# Patient Record
Sex: Male | Born: 1950
Health system: Southern US, Community
[De-identification: ages and names within clinical notes are randomized; demographics above are authoritative.]

## PROBLEM LIST (undated history)

## (undated) DIAGNOSIS — G8929 Other chronic pain: Secondary | ICD-10-CM

## (undated) DIAGNOSIS — R202 Paresthesia of skin: Secondary | ICD-10-CM

## (undated) DIAGNOSIS — M549 Dorsalgia, unspecified: Secondary | ICD-10-CM

## (undated) DIAGNOSIS — T148XXA Other injury of unspecified body region, initial encounter: Secondary | ICD-10-CM

## (undated) DIAGNOSIS — G4730 Sleep apnea, unspecified: Secondary | ICD-10-CM

## (undated) DIAGNOSIS — R300 Dysuria: Secondary | ICD-10-CM

## (undated) DIAGNOSIS — Z87442 Personal history of urinary calculi: Secondary | ICD-10-CM

## (undated) DIAGNOSIS — M1990 Unspecified osteoarthritis, unspecified site: Secondary | ICD-10-CM

## (undated) DIAGNOSIS — R29898 Other symptoms and signs involving the musculoskeletal system: Secondary | ICD-10-CM

## (undated) DIAGNOSIS — I10 Essential (primary) hypertension: Secondary | ICD-10-CM

## (undated) DIAGNOSIS — R52 Pain, unspecified: Secondary | ICD-10-CM

## (undated) DIAGNOSIS — N289 Disorder of kidney and ureter, unspecified: Secondary | ICD-10-CM

## (undated) HISTORY — DX: Other chronic pain: G89.29

## (undated) HISTORY — DX: Other injury of unspecified body region, initial encounter: T14.8XXA

## (undated) HISTORY — PX: KIDNEY STONE SURGERY: SHX686

## (undated) HISTORY — PX: HERNIA REPAIR: SHX51

## (undated) HISTORY — PX: OTHER SURGICAL HISTORY: SHX169

## (undated) HISTORY — DX: Dysuria: R30.0

## (undated) HISTORY — PX: WISDOM TOOTH EXTRACTION: SHX21

## (undated) HISTORY — PX: MASTECTOMY: SHX3

## (undated) HISTORY — PX: BACK SURGERY: SHX140

## (undated) HISTORY — PX: FACIAL COSMETIC SURGERY: SHX629

## (undated) HISTORY — DX: Essential (primary) hypertension: I10

---

## 1997-02-09 DIAGNOSIS — G8929 Other chronic pain: Secondary | ICD-10-CM

## 1997-02-09 HISTORY — DX: Other chronic pain: G89.29

## 2001-09-05 ENCOUNTER — Ambulatory Visit (HOSPITAL_COMMUNITY): Admission: RE | Admit: 2001-09-05 | Discharge: 2001-09-05 | Payer: Self-pay | Admitting: Obstetrics and Gynecology

## 2001-09-05 ENCOUNTER — Encounter: Payer: Self-pay | Admitting: Orthopedic Surgery

## 2001-11-15 ENCOUNTER — Encounter: Payer: Self-pay | Admitting: Neurosurgery

## 2001-11-15 ENCOUNTER — Ambulatory Visit (HOSPITAL_COMMUNITY): Admission: RE | Admit: 2001-11-15 | Discharge: 2001-11-15 | Payer: Self-pay | Admitting: Neurosurgery

## 2004-09-14 ENCOUNTER — Ambulatory Visit (HOSPITAL_COMMUNITY): Admission: RE | Admit: 2004-09-14 | Discharge: 2004-09-14 | Payer: Self-pay | Admitting: Family Medicine

## 2004-11-11 ENCOUNTER — Inpatient Hospital Stay (HOSPITAL_COMMUNITY): Admission: RE | Admit: 2004-11-11 | Discharge: 2004-11-13 | Payer: Self-pay | Admitting: Neurosurgery

## 2005-08-06 ENCOUNTER — Inpatient Hospital Stay (HOSPITAL_COMMUNITY): Admission: EM | Admit: 2005-08-06 | Discharge: 2005-08-07 | Payer: Self-pay | Admitting: Emergency Medicine

## 2008-12-31 ENCOUNTER — Encounter: Admission: RE | Admit: 2008-12-31 | Discharge: 2008-12-31 | Payer: Self-pay | Admitting: Orthopedic Surgery

## 2009-05-20 ENCOUNTER — Ambulatory Visit (HOSPITAL_COMMUNITY): Admission: RE | Admit: 2009-05-20 | Discharge: 2009-05-20 | Payer: Self-pay | Admitting: Anesthesiology

## 2009-07-11 ENCOUNTER — Encounter: Admission: RE | Admit: 2009-07-11 | Discharge: 2009-07-11 | Payer: Self-pay | Admitting: Orthopedic Surgery

## 2009-08-20 ENCOUNTER — Ambulatory Visit (HOSPITAL_COMMUNITY): Admission: RE | Admit: 2009-08-20 | Discharge: 2009-08-21 | Payer: Self-pay | Admitting: Orthopedic Surgery

## 2010-03-02 ENCOUNTER — Encounter: Payer: Self-pay | Admitting: Orthopedic Surgery

## 2010-04-27 LAB — SURGICAL PCR SCREEN
MRSA, PCR: NEGATIVE
Staphylococcus aureus: NEGATIVE

## 2010-04-27 LAB — DIFFERENTIAL
Basophils Absolute: 0 10*3/uL (ref 0.0–0.1)
Basophils Relative: 1 % (ref 0–1)
Eosinophils Absolute: 0.2 10*3/uL (ref 0.0–0.7)
Eosinophils Relative: 4 % (ref 0–5)
Lymphocytes Relative: 25 % (ref 12–46)
Lymphs Abs: 1.5 10*3/uL (ref 0.7–4.0)
Monocytes Absolute: 0.6 10*3/uL (ref 0.1–1.0)
Monocytes Relative: 10 % (ref 3–12)
Neutro Abs: 3.6 10*3/uL (ref 1.7–7.7)
Neutrophils Relative %: 60 % (ref 43–77)

## 2010-04-27 LAB — CBC
HCT: 41.6 % (ref 39.0–52.0)
Hemoglobin: 14.2 g/dL (ref 13.0–17.0)
MCH: 30.4 pg (ref 26.0–34.0)
MCHC: 34 g/dL (ref 30.0–36.0)
MCV: 89.3 fL (ref 78.0–100.0)
Platelets: 185 10*3/uL (ref 150–400)
RBC: 4.67 MIL/uL (ref 4.22–5.81)
RDW: 12.9 % (ref 11.5–15.5)
WBC: 5.9 10*3/uL (ref 4.0–10.5)

## 2010-04-27 LAB — URINALYSIS, ROUTINE W REFLEX MICROSCOPIC
Glucose, UA: NEGATIVE mg/dL
Hgb urine dipstick: NEGATIVE
Ketones, ur: NEGATIVE mg/dL
Nitrite: NEGATIVE
Protein, ur: NEGATIVE mg/dL
Specific Gravity, Urine: 1.026 (ref 1.005–1.030)
Urobilinogen, UA: 1 mg/dL (ref 0.0–1.0)
pH: 6 (ref 5.0–8.0)

## 2010-04-27 LAB — COMPREHENSIVE METABOLIC PANEL
ALT: 12 U/L (ref 0–53)
AST: 20 U/L (ref 0–37)
Albumin: 4.1 g/dL (ref 3.5–5.2)
Alkaline Phosphatase: 100 U/L (ref 39–117)
BUN: 16 mg/dL (ref 6–23)
CO2: 26 mEq/L (ref 19–32)
Calcium: 9.2 mg/dL (ref 8.4–10.5)
Chloride: 107 mEq/L (ref 96–112)
Creatinine, Ser: 0.95 mg/dL (ref 0.4–1.5)
GFR calc Af Amer: >60 mL/min (ref >60)
GFR calc non Af Amer: >60 mL/min (ref >60)
Glucose, Bld: 99 mg/dL (ref 70–99)
Potassium: 4.1 mEq/L (ref 3.5–5.1)
Sodium: 139 mEq/L (ref 135–145)
Total Bilirubin: 0.5 mg/dL (ref 0.3–1.2)
Total Protein: 7 g/dL (ref 6.0–8.3)

## 2010-04-27 LAB — ABO/RH: ABO/RH(D): A POS

## 2010-04-27 LAB — TYPE AND SCREEN
ABO/RH(D): A POS
Antibody Screen: NEGATIVE

## 2010-04-27 LAB — PROTIME-INR
INR: 1.02 (ref 0.00–1.49)
Prothrombin Time: 13.3 seconds (ref 11.6–15.2)

## 2010-04-27 LAB — APTT: aPTT: 26 seconds (ref 24–37)

## 2010-06-27 NOTE — Op Note (Signed)
NAME:  Luke Foster, Luke Foster NO.:  [id number]   MEDICAL RECORD NO.:  [id number]           PATIENT TYPE:   LOCATION:                                 FACILITY:   PHYSICIAN:  Danae Orleans. Venetia Maxon, M.D.       DATE OF BIRTH:   DATE OF PROCEDURE:  11/11/2004  DATE OF DISCHARGE:                                 OPERATIVE REPORT   PREOPERATIVE DIAGNOSES:  1.  Recurrent herniated lumbar disk with spondylosis.  2.  Degenerative disk disease.  3.  Radiculopathy L4-5 and L5-S1 levels.  4.  Anterololisthesis of L5 on S1.  5.  Retrololisthesis of L4 and L5.   POSTOPERATIVE DIAGNOSES:  1.  Recurrent herniated lumbar disk with spondylosis.  2.  Degenerative disk disease.  3.  Radiculopathy L4-5 and L5-S1 levels.  4.  Anterololisthesis of L5 on S1.  5.  Retrololisthesis of L4 and L5.   PROCEDURE:  1.  Redo laminectomy at L4-5 with transforaminal lumbar interbody fusion at      L4-5  2.  Posterior lumbar interbody fusion at L5-S1 with peak interbody cages      with morselized bone autograft and pedicle screw fixation with      posterolateral arthrodesis of  L4 through sacral levels.   SURGEON:  Danae Orleans. Venetia Maxon, M.D.   ASSISTANT:  Coletta Memos, M.D.   ANESTHESIA:  General endotracheal anesthesia.   ESTIMATED BLOOD LOSS:  Approximately 300 mL.   COMPLICATIONS:  None.   DISPOSITION:  Recovery.   INDICATIONS:  Luke Foster is a 60 year old man who previously had surgery at  L4-5 on the right by another physician.  He had some relief of pain but then  developed persistent severe right greater than left lower extremity pain.  He developed retrololisthesis of L4 and L5 with anterololisthesis of L5 on  S1 with significant foraminal stenosis of the L5-S1 level on the right.  It  was elected to taken him to surgery for redo discectomy with decompression  and fusion of the L4-5 and L5-S1 levels   DESCRIPTION OF PROCEDURE:  [name] was brought to the operating room.  Following a  satisfactory and uncomplicated induction of general endotracheal  anesthesia and placement of intravenous lines, and a Foley catheter; the  patient was placed in the prone position on the operating table  All soft  tissues and bony prominences were padded appropriately.  He was placed on  the [facility] spine frame.  His low back was then shaved, prepped and draped  in the usual sterile fashion.  The area of planned incision was infiltrated  with 1/4% Marcaine and 1/2% lidocaine and 1:200,000 epinephrine.   An incision was made through previous incision to the lumbosacral fascia  from approximately L4 to the sacral levels and carried though subperiosteal  dissection bilaterally exposing the L4 and L5 spinous processes, laminae,  and transverse processes of L4, L5, and the sacral ala bilaterally.  Self-  retaining __________ retractors were placed to facilitate exposure.  After  confirmatory x-ray was obtained which demonstrated marked __________ of the  L4 pedicles a laminectomy was performed on the left side of the midline with  total removal of the left L5 lamina and inferior facet, then the L4 lamina  and inferior facet with subsequent decompression of the thecal sac and L4  and L5 nerve roots.  The discectomy was performed at this level, on the left  of the midline, at the L4-5 and L5-S1 levels.   Attention was then turned to the right side where a similar depression was  performed at L5-S1; and at this level, there was a calcified disk herniation  within the foramen and the L5 nerve root and S1 nerve roots were  decompressed widely. At this level a 12 mm interdisc spacer was then placed  on the left and further aggressive discectomy was performed at the L5-S1  level on the right.  Subsequently the previously operated right L4-5 level  was reexposed and it was found that there was a fracture of the pars  interarticularis with mobile superior articular facet of L4 which was fairly   densely scarred to the L4 nerve root.  This was very carefully decompressed  using loop magnification and microdissection technique but the lateral  thecal sac was decompressed and the L4 nerve root was decompressed.  The  disk space retractor was placed on the right, and then more aggressive  diskectomy was performed at L4-5 on the left. After this was done,  fluoroscopy was brought out onto the field and using intraoperative  fluoroscopy and trial sizer with a 12-mm, interbody spacer; a 12-mm PEEK  cage was selected packing morselized bone autograft inserted into the  interspace it turned into a transverse position.  Additional morselized bone  autograft was placed overlying the spacer.   Attention was then turned to the L5-S1 level where at this level PEEK  interbody spacers 12 mm in thickness were then packed with morselized bone,  autograft and inserted in a cliff orientation.  These were then countersunk  appropriately and an additional bone autograft was placed overlying the  implants  Pedicle screw fixation was then placed using 40 mm x 6.5 mm sacral  screws at the sacrum; 45 x 6.5 mm screws at L5; and 50 x 6.5 mm screws at  L4. All screws had excellent purchase and their positioning was confirmed on  AP and lateral fluoroscopy.  The 50 mm rods were placed overlying these  screws which were locked down in situ; 30 mL of bone graft extender was then  reconstituted with the marrow-rich blood aspirated from the course of the  pedicle screws; and this was then inserted overlying the decorticated  transverse processes of L4, L5, and the sacral ala bilaterally.  Prior to  doing so the wound was copiously irrigated with bacitracin and saline.   The self-retaining retractor was then removed.  Soft tissues were inspected  and found to be in good repair.  The nerve roots were felt to be well  decompressed  The lumbodorsal fascia was closed with 1-0 Vicryl sutures. Subcutaneous tissues were  reapproximated with 2-0 Vicryl interrupted,  inverted sutures, and the skin was reapproximated with interrupted 3-0  Vicryl subcuticular stitch.  The wound was dressed with benzoin, and Steri-  Strips, Telfa gauze, and tape.  The patient was extubated in the operating  room and taken to recovery room in stable, satisfactory condition, having  tolerated his operation well.  Counts correct at the end of the case.      Danae Orleans. Venetia Maxon, M.D.  Electronically Signed    JDS/MEDQ  D:  11/11/2004  T:  11/11/2004  Job:  [id number]

## 2010-06-27 NOTE — H&P (Signed)
NAME:  Luke Foster, Luke Foster NO.:  [id number]   MEDICAL RECORD NO.:  [id number]          PATIENT TYPE:  INP   LOCATION:  1823                         FACILITY:  [facility]   PHYSICIAN:  Corky Crafts, MDDATE OF BIRTH:  July 27, 1950   DATE OF ADMISSION:  08/06/2005  DATE OF DISCHARGE:                                HISTORY & PHYSICAL   PRIMARY CARE PHYSICIAN:  Unassigned.   CHIEF COMPLAINT:  Chest tightness.   HISTORY OF PRESENT ILLNESS:  Luke Foster is a 60 year old Caucasian male  without a known cardiac history.  He has a family history of paroxysmal  atrial fibrillation and hyperlipidemia.  He was in his usual state of health  until early this morning at 2:30 a.m. when he complained of a sudden onset  of chest tightness and diaphoresis.  He stated that the chest tightness felt  like indigestion.  He did not take anything for treatment of the  indigestion and denies a history of GERD.  The chest tightness lasted for  approximately 45 minutes and was spontaneously relieved.  He denies nausea,  vomiting, shortness of breath, dizziness, and syncope.  He denies a history  of hypertension, although he has a home blood pressure cuff.  The patient  checked his blood pressure at home and obtained a diastolic blood pressure  reading of 135.  He showered and went to work and rechecked his blood  pressure upon arrival to work obtaining a reading of 141/101.  He arrived to  the [facility] Emergency Department by a private vehicle.  A 12-lead EKG was  obtained upon arrival to the [facility] Emergency Department which revealed  normal sinus rhythm at 74 beats per minute with nonspecific ST and T-wave  abnormalities.  It revealed no ischemic changes.  Point of care enzymes were  negative x1 with a troponin I of less than 0.05.  The patient admits to  currently being chest pain/chest tightness free and denies a recurrence  since early this morning.   PAST MEDICAL HISTORY:  1.   Insomnia.  2.  Status post back surgery in October of 2006.   ALLERGIES:  CORTICOSTEROIDS, ERYTHROMYCIN.   MEDICATIONS:  Amitriptyline 10 mg at bedtime.   FAMILY HISTORY:  1.  Father living, age 36, paroxysmal atrial fibrillation.  2.  Mother living, age 33, hyperlipidemia.  3.  Two sisters - healthy.  4.  Brother healthy.   SOCIAL HISTORY:  Divorced with no children.  He is employed as a Multimedia programmer.  He denies tobacco, alcohol, or illicit drug use.  He denies a consistent exercise regimen since his back surgery in October of  2006.   REVIEW OF SYSTEMS:  All other systems are negative other than what is stated  in the HPI.   PHYSICAL EXAMINATION:  GENERAL:  A 59 year old well-developed, well-  nourished male, pleasant and cooperative, NAD.  VITALS:  Temperature 98, pulse 81, blood pressure 145/99, respirations 21,  O2 saturations 98% over room air.  HEENT:  Unremarkable.  NECK:  Supple without JVD or carotid bruits bilaterally.  Carotid upstrokes  2+.  LUNGS:  Breath sounds are clear to auscultation bilaterally without wheezes,  rhonchi, crackles, or rales.  HEART:  Regular rate and rhythm.  S1 and S2 normal without murmurs, gallops,  clicks, or rubs.  ABDOMEN:  Soft, nontender, nondistended with active bowel sounds.  No  masses, hepatomegaly, or bilateral bruits.  EXTREMITIES:  No peripheral edema.  DP and PT pulses 2+/2 bilaterally.  SKIN:  Warm and dry without rashes or lesions.  NEUROLOGIC:  Alert and oriented x3.  No focal deficits.  PSYCH:  Normal mood and affect.   LABORATORY DATA:  Sodium 141, potassium 3.7, chloride 110, CO2 22.7, BUN 21,  creatinine 1.1, glucose 95.  Hemoglobin 16, hematocrit 47.  Point of care  enzymes:  Myoglobin 74.6, CK-MB 1.4, troponin I less than 0.05.  EKG:  Normal sinus rhythm at 74 beats per minute with nonspecific ST and T-wave  abnormalities.  No ischemic changes noted.  Chest x-ray:  No acute  cardiomegaly  findings.  Stable chest x-ray.   ASSESSMENT AND PLAN:  1.  Chest tightness, resolved.  Rule out myocardial infarction.  EKG      negative.  Point of care enzymes negative x1.  Obtain serial cardiac      enzymes including CK total, CK-MB, and troponin I now and q.8h. x2.      Admit to cardiac telemetry unit under the service of Dr. Eldridge Dace with      the diagnosis of chest pain.  2.  Start subcutaneous Lovenox 1 mg/kg every 12 hours.  3.  Adenosine Cardiolite in the a.m. on August 07, 2005.  The patient is      unable to walk on a treadmill secondary to his back surgery in October      2006.  The patient will be n.p.o. after midnight on August 06, 2005 except      for medications.  4.  Check a BMET, CBC, FLP, EKG in the a.m.  5.  Start enteric-coated aspirin 325 mg daily.  6.  Start metoprolol 25 mg twice daily.  7.  Sublingual nitroglycerin 0.4 mg x3 as needed for chest pain.  Patient is      currently chest pain/chest tightness free.  8.  Check a PT, INR, and PTT, and magnesium.  9.  Continue amitriptyline 10 mg at bedtime.  10. The patient is to be on bed rest, however, may get out of bed to go to      the bathroom.  11. Diet:  2 g sodium diet.  12. Dr. Eldridge Dace has seen, interviewed, and examined the patient who      participated in the medical decision making and plan of care.      [address] [place], [place]      Corky Crafts, MD  Electronically Signed    RDM/MEDQ  D:  08/06/2005  T:  08/06/2005  Job:  [id number]

## 2010-06-27 NOTE — Op Note (Signed)
NAME:  ZIERE, DOCKEN NO.:  [id number]   MEDICAL RECORD NO.:  [id number]                   PATIENT TYPE:  OIB   LOCATION:  3009                                 FACILITY:  [facility]   PHYSICIAN:  Donzetta Sprung. Wynetta Emery, M.D.                  DATE OF BIRTH:  1950-02-28   DATE OF PROCEDURE:  11/15/2001  DATE OF DISCHARGE:                                 OPERATIVE REPORT   PREOPERATIVE DIAGNOSES:  Right L4 and L5 radiculopathy from preforaminal and  foraminal disk rupture L4-5 right with lateral recess stenosis.   POSTOPERATIVE DIAGNOSES:  Right L4 and L5 radiculopathy from preforaminal  and foraminal disk rupture L4-5 right with lateral recess stenosis.   OPERATION PERFORMED:  Decompressive laminectomy at L4-5 right with  microscopic dissection of the right L4 and L5 nerve roots and foraminotomies  of the right L4 and L5 nerve roots with microscopic diskectomy of the L4-5  disk space.   SURGEON:  Donzetta Sprung. Wynetta Emery, M.D.   ASSISTANT:  Kathaleen Maser. Pool, M.D.   ANESTHESIA:  General endotracheal.   INDICATIONS FOR PROCEDURE:  The patient is a very pleasant 60 year old  gentleman who has had longstanding back and right leg pain that has radiated  down to his anterior thigh and to his ankle and occasionally radiating to  his foot and his big toe. The patient has been refractory to anti-  inflammatories and physical therapy.  The patient is not eligible for  steroid injections secondary to congestive heart failure response with  steroids.  The patient was recommended decompressive laminectomy and  diskectomy.  He has been counseled to the risks of having surgery and he  understands and gives his consent.   DESCRIPTION OF PROCEDURE:  The patient was brought to the OR and was induced  under general anesthesia. The patient was placed supine position and a  preoperative x-ray showed the localizing needle at the L5 spinous process. A  midline incision was made; Bovie  electrocautery was used to dissect down  through subcutaneous tissues and subperiosteal dissection was carried out to  the lamina of L4 and L5 and part of the lamina and facet complex of L3.  Self-retaining retractor was placed and intraoperative x-ray obtained  confirmed level of the L4-5 disk space.  Using high speed drill medial  aspect of the facet complex and the inferior aspect of lamina L4 was drilled  down.  Then using a 3 and 4 mm Kerrison punch, virtually the entire lamina  of L4 was removed from the medial aspect of the facet complex and superior  aspect of the lamina of L5 exposing the ligamentum flavum. This was then  subsequently removed piecemeal fashion exposing thecal sac and under medial  aspect of the facet complex was underbitten in both the L5 and proximal  aspect of the L4 nerve root was identified.  Then operating  microscope was  draped into the field and under  microscopic illumination, the L4 nerve root  was identified and decompressed at its foramen and the L5 nerve root was  also identified.  There was noted to be bulging disk that was compressing  proximally to the L5 nerve root as well as a decompressing the L4 nerve  root.  Annulotomy was made with a lumbar scalpel and pituitary rongeurs were  used to radically clean out the disk space and extend it laterally using a  down going Epstein curet and then a hockey stick.  The lateral disk space  was freed up and several fragments of disk were removed.  At the end of the  diskectomy, the disk space was radically cleaned out and both annulotomies  as we continued to explore the L4 nerve root and L5 nerve root digitally at  the foramen.  It was felt that all the lateral disk underneath the L4 nerve  root had been removed and there was no need to go extraforaminally at this  level.  Then after both neural foramina were noted to be widely patent, the  wound was copiously irrigated and meticulous hemostasis was  maintained.  Gelfoam was laid overtop the dura and the fascia and muscle reapproximated  with 0 interrupted Vicryl, subcutaneous tissues closed with 2-0 interrupted  Vicryls, skin was closed with __________ subcuticularly. Benzoin and Steri-  Strips were applied.  The patient was taken to recovery room in stable  condition.  At end of case needle count and sponge counts were correct.                                               Jillyn Hidden P. Wynetta Emery, M.D.    GPC/MEDQ  D:  11/15/2001  T:  11/15/2001  Job:  [id number]

## 2010-11-04 ENCOUNTER — Other Ambulatory Visit (HOSPITAL_COMMUNITY): Payer: Self-pay | Admitting: Internal Medicine

## 2010-11-04 DIAGNOSIS — N400 Enlarged prostate without lower urinary tract symptoms: Secondary | ICD-10-CM

## 2010-11-06 ENCOUNTER — Other Ambulatory Visit (HOSPITAL_COMMUNITY): Payer: Self-pay

## 2010-11-10 ENCOUNTER — Other Ambulatory Visit (HOSPITAL_COMMUNITY): Payer: Self-pay

## 2011-05-18 ENCOUNTER — Other Ambulatory Visit: Payer: Self-pay | Admitting: Physician Assistant

## 2011-05-18 DIAGNOSIS — M545 Low back pain, unspecified: Secondary | ICD-10-CM

## 2011-05-18 DIAGNOSIS — M79606 Pain in leg, unspecified: Secondary | ICD-10-CM

## 2011-05-22 ENCOUNTER — Ambulatory Visit
Admission: RE | Admit: 2011-05-22 | Discharge: 2011-05-22 | Disposition: A | Discharge status: Home / Self Care | Payer: Medicare Other | Source: Ambulatory Visit | Attending: Physician Assistant | Admitting: Physician Assistant

## 2011-05-22 VITALS — BP 107/69 | HR 78

## 2011-05-22 DIAGNOSIS — M545 Low back pain, unspecified: Secondary | ICD-10-CM

## 2011-05-22 DIAGNOSIS — M79606 Pain in leg, unspecified: Secondary | ICD-10-CM

## 2011-05-22 MED ORDER — ONDANSETRON HCL 4 MG/2ML IJ SOLN
4.0000 mg | Freq: Once | INTRAMUSCULAR | Status: AC
  Administered 2011-05-22: 4 mg via INTRAMUSCULAR

## 2011-05-22 MED ORDER — IOHEXOL 180 MG/ML  SOLN
15.0000 mL | Freq: Once | INTRAMUSCULAR | Status: AC | PRN
  Administered 2011-05-22: 15 mL via INTRATHECAL

## 2011-05-22 MED ORDER — DIAZEPAM 5 MG PO TABS
10.0000 mg | ORAL_TABLET | Freq: Once | ORAL | Status: AC
  Administered 2011-05-22: 10 mg via ORAL

## 2011-05-22 MED ORDER — HYDROMORPHONE HCL PF 2 MG/ML IJ SOLN
2.0000 mg | Freq: Once | INTRAMUSCULAR | Status: AC
  Administered 2011-05-22: 2 mg via INTRAMUSCULAR

## 2011-05-22 NOTE — Progress Notes (Signed)
Pt states he's been off cymbalta for the past 2 days.

## 2011-05-22 NOTE — Discharge Instructions (Signed)
Myelogram Discharge Instructions  1. Go home and rest quietly for the next 24 hours.  It is important to lie flat for the next 24 hours.  Get up only to go to the restroom.  You may lie in the bed or on a couch on your back, your stomach, your left side or your right side.  You may have one pillow under your head.  You may have pillows between your knees while you are on your side or under your knees while you are on your back.  2. DO NOT drive today.  Recline the seat as far back as it will go, while still wearing your seat belt, on the way home.  3. You may get up to go to the bathroom as needed.  You may sit up for 10 minutes to eat.  You may resume your normal diet and medications unless otherwise indicated.  Drink lots of extra fluids today and tomorrow.  4. The incidence of headache, nausea, or vomiting is about 5% (one in 20 patients).  If you develop a headache, lie flat and drink plenty of fluids until the headache goes away.  Caffeinated beverages may be helpful.  If you develop severe nausea and vomiting or a headache that does not go away with flat bed rest, call [phone].  5. You may resume normal activities after your 24 hours of bed rest is over; however, do not exert yourself strongly or do any heavy lifting tomorrow. If when you get up you have a headache when standing, go back to bed and force fluids for another 24 hours.  6. Call your physician for a follow-up appointment.  The results of your myelogram will be sent directly to your physician by the following day.  7. If you have any questions or if complications develop after you arrive home, please call [phone].  Discharge instructions have been explained to the patient.  The patient, or the person responsible for the patient, fully understands these instructions.       May resume cymbalta on May 23, 2011, after 1:00 pm.

## 2011-05-29 ENCOUNTER — Other Ambulatory Visit: Payer: Self-pay | Admitting: Physical Medicine and Rehabilitation

## 2011-05-29 DIAGNOSIS — IMO0002 Reserved for concepts with insufficient information to code with codable children: Secondary | ICD-10-CM

## 2011-06-02 ENCOUNTER — Ambulatory Visit
Admission: RE | Admit: 2011-06-02 | Discharge: 2011-06-02 | Disposition: A | Discharge status: Home / Self Care | Payer: Medicare Other | Source: Ambulatory Visit | Attending: Physical Medicine and Rehabilitation | Admitting: Physical Medicine and Rehabilitation

## 2011-06-02 DIAGNOSIS — IMO0002 Reserved for concepts with insufficient information to code with codable children: Secondary | ICD-10-CM

## 2011-10-06 ENCOUNTER — Other Ambulatory Visit (HOSPITAL_COMMUNITY): Payer: Medicare Other

## 2011-10-06 ENCOUNTER — Ambulatory Visit (HOSPITAL_COMMUNITY)
Admission: RE | Admit: 2011-10-06 | Discharge: 2011-10-06 | Disposition: A | Discharge status: Home / Self Care | Payer: Medicare Other | Source: Ambulatory Visit | Attending: Internal Medicine | Admitting: Internal Medicine

## 2011-10-06 ENCOUNTER — Other Ambulatory Visit (HOSPITAL_COMMUNITY): Payer: Self-pay | Admitting: Internal Medicine

## 2011-10-06 DIAGNOSIS — M545 Low back pain, unspecified: Secondary | ICD-10-CM | POA: Insufficient documentation

## 2011-10-06 DIAGNOSIS — M549 Dorsalgia, unspecified: Secondary | ICD-10-CM

## 2011-10-06 DIAGNOSIS — M533 Sacrococcygeal disorders, not elsewhere classified: Secondary | ICD-10-CM | POA: Insufficient documentation

## 2011-10-06 DIAGNOSIS — R209 Unspecified disturbances of skin sensation: Secondary | ICD-10-CM | POA: Insufficient documentation

## 2011-12-09 ENCOUNTER — Other Ambulatory Visit: Payer: Self-pay | Admitting: Orthopedic Surgery

## 2011-12-09 DIAGNOSIS — M810 Age-related osteoporosis without current pathological fracture: Secondary | ICD-10-CM

## 2011-12-09 DIAGNOSIS — Z981 Arthrodesis status: Secondary | ICD-10-CM

## 2011-12-14 ENCOUNTER — Ambulatory Visit
Admission: RE | Admit: 2011-12-14 | Discharge: 2011-12-14 | Disposition: A | Discharge status: Home / Self Care | Payer: Medicare Other | Source: Ambulatory Visit | Attending: Orthopedic Surgery | Admitting: Orthopedic Surgery

## 2011-12-14 DIAGNOSIS — Z981 Arthrodesis status: Secondary | ICD-10-CM

## 2011-12-14 DIAGNOSIS — M810 Age-related osteoporosis without current pathological fracture: Secondary | ICD-10-CM

## 2012-11-16 ENCOUNTER — Other Ambulatory Visit (HOSPITAL_COMMUNITY): Payer: Self-pay | Admitting: Neurosurgery

## 2012-11-16 ENCOUNTER — Other Ambulatory Visit: Payer: Self-pay | Admitting: Neurosurgery

## 2012-11-16 DIAGNOSIS — M542 Cervicalgia: Secondary | ICD-10-CM

## 2012-11-16 DIAGNOSIS — M4714 Other spondylosis with myelopathy, thoracic region: Secondary | ICD-10-CM

## 2012-11-16 DIAGNOSIS — M5137 Other intervertebral disc degeneration, lumbosacral region: Secondary | ICD-10-CM

## 2012-11-16 DIAGNOSIS — IMO0002 Reserved for concepts with insufficient information to code with codable children: Secondary | ICD-10-CM

## 2012-11-16 DIAGNOSIS — M51379 Other intervertebral disc degeneration, lumbosacral region without mention of lumbar back pain or lower extremity pain: Secondary | ICD-10-CM

## 2012-11-18 ENCOUNTER — Ambulatory Visit (HOSPITAL_COMMUNITY)
Admission: RE | Admit: 2012-11-18 | Discharge: 2012-11-18 | Disposition: A | Discharge status: Home / Self Care | Payer: Medicare Other | Source: Ambulatory Visit | Attending: Neurosurgery | Admitting: Neurosurgery

## 2012-11-18 VITALS — BP 105/66 | HR 56 | Temp 97.9°F | Resp 18 | Ht 69.0 in | Wt 204.0 lb

## 2012-11-18 DIAGNOSIS — M542 Cervicalgia: Secondary | ICD-10-CM

## 2012-11-18 DIAGNOSIS — IMO0002 Reserved for concepts with insufficient information to code with codable children: Secondary | ICD-10-CM

## 2012-11-18 DIAGNOSIS — M47814 Spondylosis without myelopathy or radiculopathy, thoracic region: Secondary | ICD-10-CM | POA: Insufficient documentation

## 2012-11-18 DIAGNOSIS — M545 Low back pain, unspecified: Secondary | ICD-10-CM | POA: Insufficient documentation

## 2012-11-18 DIAGNOSIS — M5137 Other intervertebral disc degeneration, lumbosacral region: Secondary | ICD-10-CM

## 2012-11-18 DIAGNOSIS — M4714 Other spondylosis with myelopathy, thoracic region: Secondary | ICD-10-CM

## 2012-11-18 DIAGNOSIS — M51379 Other intervertebral disc degeneration, lumbosacral region without mention of lumbar back pain or lower extremity pain: Secondary | ICD-10-CM

## 2012-11-18 MED ORDER — DIAZEPAM 5 MG PO TABS
10.0000 mg | ORAL_TABLET | Freq: Once | ORAL | Status: AC
  Administered 2012-11-18: 10 mg via ORAL

## 2012-11-18 MED ORDER — ONDANSETRON HCL 4 MG/2ML IJ SOLN: 4.0000 mg | Freq: Four times a day (QID) | INTRAMUSCULAR | Status: DC | PRN

## 2012-11-18 MED ORDER — IOHEXOL 300 MG/ML  SOLN
10.0000 mL | Freq: Once | INTRAMUSCULAR | Status: AC | PRN
  Administered 2012-11-18: 10 mL via INTRATHECAL

## 2012-11-18 NOTE — Procedures (Signed)
Luke Harman, MD Physician Signed Neurosurgery Procedures Service date: 11/18/2012 1:43 PM    Pre-procedure Dx: Thoracic spondylosis with myelopathy [721.41] Lumbar radiculopathy [724.4]    Post-procedure Dx: Thoracic spondylosis with myelopathy [721.41] Lumbar radiculopathy [724.4]    Procedures: DG MYELOGRAM TOTAL [[id number] (Type: CPT(R))]   Lumbar myelogram L 23 with omnipaque 300

## 2012-12-09 ENCOUNTER — Other Ambulatory Visit: Payer: Self-pay | Admitting: Neurology

## 2012-12-09 ENCOUNTER — Ambulatory Visit (INDEPENDENT_AMBULATORY_CARE_PROVIDER_SITE_OTHER): Payer: Medicare Other | Admitting: Neurology

## 2012-12-09 ENCOUNTER — Encounter: Payer: Self-pay | Admitting: Neurology

## 2012-12-09 VITALS — BP 116/66 | HR 70 | Temp 98.0°F | Ht 69.0 in | Wt 199.0 lb

## 2012-12-09 DIAGNOSIS — M5417 Radiculopathy, lumbosacral region: Secondary | ICD-10-CM | POA: Insufficient documentation

## 2012-12-09 DIAGNOSIS — R29898 Other symptoms and signs involving the musculoskeletal system: Secondary | ICD-10-CM

## 2012-12-09 LAB — TSH: TSH: 0.812 u[IU]/mL (ref 0.350–4.500)

## 2012-12-09 LAB — CK: Total CK: 148 U/L (ref 7–232)
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
## 2012-12-09 NOTE — Patient Instructions (Signed)
1.  Check blood work today 2.  EMG of the legs 3.  Fall precautions discussed 4.  Return to clinic in 4 weeks

## 2012-12-12 LAB — B. BURGDORFI ANTIBODIES: B burgdorferi Ab IgG+IgM: 0.37 {ISR}

## 2012-12-13 ENCOUNTER — Other Ambulatory Visit: Payer: Self-pay | Admitting: Neurology

## 2012-12-13 LAB — IMMUNOFIXATION ELECTROPHORESIS
IgA: 325 mg/dL (ref 68–379)
IgG (Immunoglobin G), Serum: 1490 mg/dL (ref 650–1600)
IgM, Serum: 56 mg/dL (ref 41–251)
Total Protein, Serum Electrophoresis: 7.1 g/dL (ref 6.0–8.3)

## 2012-12-13 LAB — PROTEIN ELECTROPHORESIS, SERUM
Albumin ELP: 55.1 % — ABNORMAL LOW (ref 55.8–66.1)
Alpha-1-Globulin: 6.3 % — ABNORMAL HIGH (ref 2.9–4.9)
Alpha-2-Globulin: 9.7 % (ref 7.1–11.8)
Beta 2: 4.9 % (ref 3.2–6.5)
Beta Globulin: 6.8 % (ref 4.7–7.2)
Gamma Globulin: 17.2 % (ref 11.1–18.8)
Total Protein, Serum Electrophoresis: 7.1 g/dL (ref 6.0–8.3)

## 2012-12-13 LAB — ALDOLASE: Aldolase: 4.8 U/L (ref <=8.1)

## 2012-12-15 LAB — PROTEIN ELECTROPHORESIS, URINE REFLEX
Total Protein, Urine/Day: 60 mg/d (ref 50–100)
Total Protein, Urine: 6 mg/dL

## 2012-12-22 ENCOUNTER — Ambulatory Visit (INDEPENDENT_AMBULATORY_CARE_PROVIDER_SITE_OTHER): Payer: Medicare Other | Admitting: Neurology

## 2012-12-22 ENCOUNTER — Encounter: Payer: Medicare Other | Admitting: Neurology

## 2012-12-22 ENCOUNTER — Encounter: Payer: Self-pay | Admitting: Neurology

## 2012-12-22 DIAGNOSIS — M5417 Radiculopathy, lumbosacral region: Secondary | ICD-10-CM

## 2012-12-22 DIAGNOSIS — IMO0002 Reserved for concepts with insufficient information to code with codable children: Secondary | ICD-10-CM

## 2012-12-22 NOTE — Progress Notes (Signed)
See procedure note under "Notes" for EMG results.  Kennett Symes K. Tevon Berhane, DO  

## 2012-12-22 NOTE — Procedures (Signed)
[facility] Neurology  [address] [place], [address]  [place], [place] [address] Tel: [phone] Fax:  [phone] Test Date:  12/22/2012  Patient: Luke Foster DOB: 1951-01-24 Physician: Nita Sickle, DO  Sex: Male Height: 5\' 10"  Ref Phys:   ID#: [id number] Temp: 33.4C Technician:    Patient Complaints: 62 year-old gentleman with bilateral proximal leg weakness (R >L) and paresthesias of his feet.  NCV & EMG Findings: Extensive evaluation of the right and left lower extremity reveals:  1. Normal sural and superficial peroneal sensory responses bilaterally.  2. The peroneal and tibial motor responses are normal on the right lower extremity. On the left, the peroneal motor response recorded at extensor digitorum brevis is markedly reduced and normal when recorded at the tibialis anterior. The left tibial motor response is normal. 3. The right H-reflex is absent and prolonged on the left side. 4. Chronic motor axonal loss changes affecting the right L3 - S1 myotomes without active changes. 5. Chronic motor axonal loss changes affecting the left L5-myotome without active denervation.  Impression: 1. These findings are suggestive of old multilevel intraspinal canal lesions (i.e. radiculopathy) affecting the right L3-L5 nerve root/segment, mild in degree electrically, and right S1 nerve root/segment, very mild in degree electrically. 2. In the left lower extremity, there is evidence of an old L5 intraspinal canal lesion (i.e. radiculopathy), moderate in degree electrically. 3. In comparison to patient's EMG dated 10/06/2011 performed at [facility], there has been no interval change. 4. There is no evidence of a generalized myopathy or large fiber generalized sensorimotor polyneuropathy affecting the lower extremities.    ___________________________ Nita Sickle, DO    Nerve Conduction Studies Anti Sensory Summary Table   Site NR Peak (ms) Norm Peak (ms) P-T Amp (V) Norm  P-T Amp  Left Sup Peroneal Anti Sensory (Ant Lat Mall)  12 cm    3.3 <4.6 15.4 >3  Right Sup Peroneal Anti Sensory (Ant Lat Mall)  12 cm    3.1 <4.6 9.8 >3  Left Sural Anti Sensory (Lat Mall)  Calf    4.1 <4.6 9.5 >3  Right Sural Anti Sensory (Lat Mall)  Calf    3.8 <4.6 10.9 >3   Motor Summary Table   Site NR Onset (ms) Norm Onset (ms) O-P Amp (mV) Norm O-P Amp Site1 Site2 Delta-0 (ms) Dist (cm) Vel (m/s) Norm Vel (m/s)  Left Peroneal Motor (Ext Dig Brev)  Ankle    13.8 <6.0 0.2 >2.5 B Fib Ankle 0.0 0.0  >40  B Fib    13.8  0.1  Poplt B Fib 0.0 0.0  >40  Poplt    13.8  0.0         Right Peroneal Motor (Ext Dig Brev)  Ankle    4.0 <6.0 3.6 >2.5 B Fib Ankle 8.0 33.0 41 >40  B Fib    12.0  3.2  Poplt B Fib 2.3 10.0 43 >40  Poplt    14.3  3.2         Left Peroneal TA Motor (Tib Ant)  Fib Head    4.0 <4.5 5.2 >3 Poplit Fib Head 1.6 10.0 63 >40  Poplit    5.6  4.3         Right Peroneal TA Motor (Tib Ant)  Fib Head    3.8 <4.5 7.6 >3 Poplit Fib Head 10.0 41.0 41 >40  Poplit    13.8  5.9         Left Tibial  Motor (Abd Hall Brev)  Ankle    3.9 <6.0 11.0 >4 Knee Ankle 11.3 45.0 40 >40  Knee    15.2  8.2          H Reflex Studies   NR H-Lat (ms) Lat Norm (ms) L-R H-Lat (ms)  Left Tibial (Gastroc)     40.44 <35   Right Tibial (Gastroc)  NR  <35    EMG   Side Muscle Ins Act Fibs Psw Fasc Number Recrt Dur Dur. Amp Amp. Poly Poly. Comment  Right AntTibialis Nml Nml Nml Nml 1- Mod-R Few 1+ Few 1+ Nml Nml Serr  Right Gastroc Nml Nml Nml Nml 1- Mod-R Few 1+ Few 1+ Nml Nml N/A  Right Flex Dig Long Nml Nml Nml Nml 2- Rapid Some 1+ Some 1+ Nml Nml N/A  Right RectFemoris Nml Nml Nml Nml 1- Mod-R Few 1+ Few 1+ Some 1+ N/A  Right AdductorLong Nml Nml Nml Nml Nml Nml Nml Nml Nml Nml Nml Nml N/A  Right Iliopsoas Nml Nml Nml Nml Nml Nml Nml Nml Nml Nml Nml Nml N/A  Right Biceps femoris SH Nml Nml Nml Nml 1- Mod-R Few 1+ Few 1+ Nml Nml N/A  Right GluteusMed Nml Nml Nml Nml 1- Mod-R Few 1+ Nml  Nml Few 1+ N/A  Right VastusLat Nml Nml Nml Nml 1- Mod-R Some 1+ Some 1+ Nml Nml N/A  Left AntTibialis Nml Nml Nml Nml 1- Mod-R Some 1+ Some 1+ Few 1+ N/A  Left Gastroc Nml Nml Nml Nml 1- Mod Nml Nml Nml Nml Nml Nml N/A  Left VastusLat Nml Nml Nml Nml Nml Nml Nml Nml Nml Nml Nml Nml N/A  Left RectFemoris Nml Nml Nml Nml Nml Nml Nml Nml Nml Nml Nml Nml N/A  Left GluteusMed Nml Nml Nml Nml 1- Mod Some 1+ Nml Nml Few 1+ N/A      Waveforms:

## 2012-12-23 ENCOUNTER — Telehealth: Payer: Self-pay | Admitting: Neurology

## 2012-12-23 NOTE — Telephone Encounter (Signed)
[facility] records summarized below:  EMG 10/06/2011:  Normal sural responses bilaterally.  Right peroneal motor response at the EDB is normal (5.6 mV) on the right and low on the left (0.2 mV) with evidence of left accessory peroneal nerve.  Normal tibial motor responses bilaterally.  Needle examination shows old bilateral L5 and S1 radiculopathy and chronic right L3 radiculopathy.  Sin biopsy dated 11/27/2011: Intra-epidermal nerve fiber density is low-normal  Duvan Mousel K. Allena Katz, DO

## 2012-12-27 ENCOUNTER — Ambulatory Visit: Payer: Medicare Other | Admitting: Neurology

## 2013-01-11 ENCOUNTER — Ambulatory Visit (INDEPENDENT_AMBULATORY_CARE_PROVIDER_SITE_OTHER): Payer: Medicare Other | Admitting: Neurology

## 2013-01-11 ENCOUNTER — Ambulatory Visit: Payer: Medicare Other | Admitting: Neurology

## 2013-01-11 ENCOUNTER — Encounter: Payer: Self-pay | Admitting: Neurology

## 2013-01-11 VITALS — BP 100/60 | HR 68 | Temp 98.2°F | Ht 69.0 in | Wt 201.0 lb

## 2013-01-11 DIAGNOSIS — R29898 Other symptoms and signs involving the musculoskeletal system: Secondary | ICD-10-CM

## 2013-01-11 DIAGNOSIS — IMO0002 Reserved for concepts with insufficient information to code with codable children: Secondary | ICD-10-CM

## 2013-01-11 DIAGNOSIS — M5417 Radiculopathy, lumbosacral region: Secondary | ICD-10-CM

## 2013-01-11 NOTE — Patient Instructions (Addendum)
1.  CT head without contrast- December 5@10am  to arrive at 9:45 1st floor/[phone] 2.  Trial of alpha-lipoic acid 600mg  3.  Physical therapy going forward 4.  Return to clinic in 55-months
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
## 2013-01-13 ENCOUNTER — Ambulatory Visit (HOSPITAL_COMMUNITY): Payer: Medicare Other

## 2013-01-18 ENCOUNTER — Ambulatory Visit (HOSPITAL_COMMUNITY)
Admission: RE | Admit: 2013-01-18 | Discharge: 2013-01-18 | Disposition: A | Discharge status: Home / Self Care | Payer: Medicare Other | Source: Ambulatory Visit | Attending: Neurology | Admitting: Neurology

## 2013-01-18 DIAGNOSIS — G319 Degenerative disease of nervous system, unspecified: Secondary | ICD-10-CM | POA: Insufficient documentation

## 2013-01-18 DIAGNOSIS — I6789 Other cerebrovascular disease: Secondary | ICD-10-CM | POA: Insufficient documentation

## 2013-01-18 DIAGNOSIS — R29898 Other symptoms and signs involving the musculoskeletal system: Secondary | ICD-10-CM

## 2013-01-18 DIAGNOSIS — M6281 Muscle weakness (generalized): Secondary | ICD-10-CM | POA: Insufficient documentation

## 2013-01-18 DIAGNOSIS — M5417 Radiculopathy, lumbosacral region: Secondary | ICD-10-CM

## 2013-01-18 DIAGNOSIS — M79609 Pain in unspecified limb: Secondary | ICD-10-CM | POA: Insufficient documentation

## 2013-03-03 DIAGNOSIS — M810 Age-related osteoporosis without current pathological fracture: Secondary | ICD-10-CM | POA: Diagnosis not present

## 2013-03-03 DIAGNOSIS — E559 Vitamin D deficiency, unspecified: Secondary | ICD-10-CM | POA: Diagnosis not present

## 2013-03-03 DIAGNOSIS — S32009A Unspecified fracture of unspecified lumbar vertebra, initial encounter for closed fracture: Secondary | ICD-10-CM | POA: Diagnosis not present

## 2013-04-12 ENCOUNTER — Ambulatory Visit: Payer: Medicare Other | Admitting: Neurology

## 2013-09-01 DIAGNOSIS — M810 Age-related osteoporosis without current pathological fracture: Secondary | ICD-10-CM | POA: Diagnosis not present

## 2013-09-01 DIAGNOSIS — E559 Vitamin D deficiency, unspecified: Secondary | ICD-10-CM | POA: Diagnosis not present

## 2013-09-07 ENCOUNTER — Encounter (HOSPITAL_COMMUNITY): Payer: Self-pay | Admitting: Emergency Medicine

## 2013-09-07 ENCOUNTER — Emergency Department (HOSPITAL_COMMUNITY)
Admission: EM | Admit: 2013-09-07 | Discharge: 2013-09-07 | Disposition: A | Discharge status: Home / Self Care | Payer: 59 | Attending: Emergency Medicine | Admitting: Emergency Medicine

## 2013-09-07 DIAGNOSIS — F329 Major depressive disorder, single episode, unspecified: Secondary | ICD-10-CM | POA: Insufficient documentation

## 2013-09-07 DIAGNOSIS — Z046 Encounter for general psychiatric examination, requested by authority: Secondary | ICD-10-CM | POA: Insufficient documentation

## 2013-09-07 DIAGNOSIS — F32A Depression, unspecified: Secondary | ICD-10-CM

## 2013-09-07 DIAGNOSIS — G8929 Other chronic pain: Secondary | ICD-10-CM | POA: Insufficient documentation

## 2013-09-07 DIAGNOSIS — Z79899 Other long term (current) drug therapy: Secondary | ICD-10-CM | POA: Insufficient documentation

## 2013-09-07 DIAGNOSIS — F3289 Other specified depressive episodes: Secondary | ICD-10-CM | POA: Insufficient documentation

## 2013-09-07 DIAGNOSIS — Z791 Long term (current) use of non-steroidal anti-inflammatories (NSAID): Secondary | ICD-10-CM | POA: Insufficient documentation

## 2013-09-07 DIAGNOSIS — Z87891 Personal history of nicotine dependence: Secondary | ICD-10-CM | POA: Insufficient documentation

## 2013-09-07 HISTORY — DX: Pain, unspecified: R52

## 2013-09-07 HISTORY — DX: Paresthesia of skin: R20.2

## 2013-09-07 HISTORY — DX: Dorsalgia, unspecified: M54.9

## 2013-09-07 HISTORY — DX: Other chronic pain: G89.29

## 2013-09-07 HISTORY — DX: Other symptoms and signs involving the musculoskeletal system: R29.898

## 2013-09-07 LAB — BASIC METABOLIC PANEL
Anion gap: 14 (ref 5–15)
BUN: 19 mg/dL (ref 6–23)
CO2: 24 mEq/L (ref 19–32)
Calcium: 9.9 mg/dL (ref 8.4–10.5)
Chloride: 104 mEq/L (ref 96–112)
Creatinine, Ser: 1.06 mg/dL (ref 0.50–1.35)
GFR calc Af Amer: 86 mL/min — ABNORMAL LOW (ref >90)
GFR calc non Af Amer: 74 mL/min — ABNORMAL LOW (ref >90)
Glucose, Bld: 98 mg/dL (ref 70–99)
Potassium: 4.5 mEq/L (ref 3.7–5.3)
Sodium: 142 mEq/L (ref 137–147)

## 2013-09-07 LAB — CBC WITH DIFFERENTIAL/PLATELET
Basophils Absolute: 0.1 10*3/uL (ref 0.0–0.1)
Basophils Relative: 1 % (ref 0–1)
Eosinophils Absolute: 0.2 10*3/uL (ref 0.0–0.7)
Eosinophils Relative: 2 % (ref 0–5)
HCT: 45.8 % (ref 39.0–52.0)
Hemoglobin: 15.4 g/dL (ref 13.0–17.0)
Lymphocytes Relative: 23 % (ref 12–46)
Lymphs Abs: 1.9 10*3/uL (ref 0.7–4.0)
MCH: 30.2 pg (ref 26.0–34.0)
MCHC: 33.6 g/dL (ref 30.0–36.0)
MCV: 89.8 fL (ref 78.0–100.0)
Monocytes Absolute: 0.6 10*3/uL (ref 0.1–1.0)
Monocytes Relative: 7 % (ref 3–12)
Neutro Abs: 5.4 10*3/uL (ref 1.7–7.7)
Neutrophils Relative %: 67 % (ref 43–77)
Platelets: 235 10*3/uL (ref 150–400)
RBC: 5.1 MIL/uL (ref 4.22–5.81)
RDW: 12.3 % (ref 11.5–15.5)
WBC: 8 10*3/uL (ref 4.0–10.5)

## 2013-09-07 LAB — ETHANOL: Alcohol, Ethyl (B): <11 mg/dL (ref 0–11)

## 2013-09-07 MED ORDER — OXYCODONE HCL 5 MG PO TABS: 5.0000 mg | ORAL_TABLET | Freq: Every day | ORAL | Status: DC | PRN

## 2013-09-07 MED ORDER — DULOXETINE HCL 60 MG PO CPEP
60.0000 mg | ORAL_CAPSULE | Freq: Every day | ORAL | Status: DC
  Filled 2013-09-07: qty 1

## 2013-09-07 MED ORDER — OXYCODONE-ACETAMINOPHEN 5-325 MG PO TABS
1.0000 | ORAL_TABLET | Freq: Every day | ORAL | Status: DC | PRN
  Administered 2013-09-07: 1 via ORAL
  Filled 2013-09-07: qty 1

## 2013-09-07 MED ORDER — TAMSULOSIN HCL 0.4 MG PO CAPS
0.4000 mg | ORAL_CAPSULE | Freq: Every day | ORAL | Status: DC
  Administered 2013-09-07: 0.4 mg via ORAL
  Filled 2013-09-07: qty 1

## 2013-09-07 MED ORDER — LIDOCAINE 5 % EX PTCH
1.0000 | MEDICATED_PATCH | CUTANEOUS | Status: DC
  Administered 2013-09-07: 1 via TRANSDERMAL
  Filled 2013-09-07 (×2): qty 1

## 2013-09-07 MED ORDER — MORPHINE SULFATE ER 30 MG PO TBCR
30.0000 mg | EXTENDED_RELEASE_TABLET | Freq: Two times a day (BID) | ORAL | Status: DC
  Administered 2013-09-07: 30 mg via ORAL
  Filled 2013-09-07: qty 1

## 2013-09-07 MED ORDER — OXYCODONE-ACETAMINOPHEN 10-325 MG PO TABS: 1.0000 | ORAL_TABLET | Freq: Every day | ORAL | Status: DC | PRN

## 2013-09-07 MED ORDER — ENALAPRIL MALEATE 5 MG PO TABS
5.0000 mg | ORAL_TABLET | Freq: Every day | ORAL | Status: DC
  Filled 2013-09-07: qty 1

## 2013-09-07 MED ORDER — IBUPROFEN 800 MG PO TABS: 800.0000 mg | ORAL_TABLET | Freq: Three times a day (TID) | ORAL | Status: DC | PRN

## 2013-09-07 NOTE — ED Notes (Signed)
Tele-psych machine set up at bedside

## 2013-09-07 NOTE — ED Notes (Signed)
Patient still unable to provide a urine specimen at this time

## 2013-09-07 NOTE — ED Notes (Signed)
Patient to be discharged per [facility] at [facility]. Dr Thurnell Garbe made aware.

## 2013-09-07 NOTE — BH Assessment (Addendum)
This Probation officer assessed patient unaware that patient had been evaluated by TTS prior. This Probation officer will document findings from assessment. Dustin Flock Mclean will document complete assessment.  Patient presents with C/O Marital Discord and Chronic Pain Issues. Patient reports that he was frustrated and being "short" in responses with his wife today and she became angry with him leading to an escalated verbal altercation. Patient reports that he began calling his wife names and told her she was acting like a "300 lb toddler" on ADHD. Pt reports that he yelled at her and she recommended that he present to the hospital for an evaluation. Patient reports that his pain issues are the primary reason for his anger and frustration and reports that he sometimes takes his anger out on his wife.   Pt reports that his pain is being managed but that he will always feel some pain even with medications. Pt denies illicit drug use and reports compliance with medications. Pt denies hx of violence and aggression. Pt denies prior hx of mental illness. Pt reports that he saw a therapist when he divorced from his first wife after being married to her for 30 years. Pt denies hx of depression. Pt denies SI,HI, and No AVH reported. Patient is willing to follow-up with a mental health provider to address his issues.   Dustin Flock Mclean consulted with Dr.Gerald Lovena Le whom is in agreement to discharge patient from ED to follow up with outpatient provider referrals.   Shaune Pollack, MS, [place] Assessment Counselor

## 2013-09-07 NOTE — BH Assessment (Signed)
This Probation officer spoke with EDP Dr.Cook prior to assessing patient.  Shaune Pollack, MS, [place] Assessment Counselor
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
## 2013-09-07 NOTE — ED Notes (Signed)
1 pair of jean shorts 1 brown belt 1 pair of shoes 1 shirt  Security locking up valuable belongings, paper copy of valuables inventory placed in patient's chart.
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
## 2013-09-07 NOTE — ED Notes (Signed)
Contacts from patient's cell phone: Patient brother Nicole Kindred) [phone] [place] Simons [phone]

## 2013-09-07 NOTE — Discharge Instructions (Signed)
°Emergency Department Resource Guide °1) Find a Doctor and Pay Out of Pocket °Although you won't have to find out who is covered by your insurance plan, it is a good idea to ask around and get recommendations. You will then need to call the office and see if the doctor you have chosen will accept you as a new patient and what types of options they offer for patients who are self-pay. Some doctors offer discounts or will set up payment plans for their patients who do not have insurance, but you will need to ask so you aren't surprised when you get to your appointment. ° °2) Contact Your Local Health Department °Not all health departments have doctors that can see patients for sick visits, but many do, so it is worth a call to see if yours does. If you don't know where your local health department is, you can check in your phone book. The CDC also has a tool to help you locate your state's health department, and many state websites also have listings of all of their local health departments. ° °3) Find a Walk-in Clinic °If your illness is not likely to be very severe or complicated, you may want to try a walk in clinic. These are popping up all over the country in pharmacies, drugstores, and shopping centers. They're usually staffed by nurse practitioners or physician assistants that have been trained to treat common illnesses and complaints. They're usually fairly quick and inexpensive. However, if you have serious medical issues or chronic medical problems, these are probably not your best option. ° °No Primary Care Doctor: °- Call Health Connect at  832-8000 - they can help you locate a primary care doctor that  accepts your insurance, provides certain services, etc. °- Physician Referral Service- 1-800-533-3463 ° °Chronic Pain Problems: °Organization         Address  Phone   Notes  °[facility]  (336) [phone] Patients need to be referred by their primary care doctor.  ° °Medication  Assistance: °Organization         Address  Phone   Notes  °[place] [facility] [address] °[place], [place] [address] (336) 641-8030 --Must be a resident of [place] °-- Must have NO insurance coverage whatsoever (no Medicaid/ Medicare, etc.) °-- The pt. MUST have a primary care doctor that directs their care regularly and follows them in the community °  °Med[facility]ist  (866) 331-1348   °[employer]  (888) 892-1162   ° °Agencies that provide inexpensive medical care: °Organization         Address  Phone   Notes  °[facility]  (336) 832-8035   °[facility]    (336) 832-7272   °[facility] [address] °[place], [place] [address] (336) 832-4777   °[facility] [address], °[place] (336) 271-4999   °[facility] Parenthood    (336) 373-0678   °[facility]    (336) 272-1050   °[facility] ° [address], [place] Phone:  (336) 832-4444, Fax:  (336) 832-4440 Hours of Operation:  9 am - 6 pm, M-F.  Also accepts Medicaid/Medicare and self-pay.  °[facility] ° [address],  Phone: (336) 832-3150, Fax: (336) [phone]. Hours of Operation:  8:30 am - 5:30 pm, M-F.  Also accepts Medicaid and self-pay.  °[facility] [place] [address], [place] Phone: (336) 878-6027   °[facility] [address], [place], [place] (336)723-1848, Ext. 123 Mondays & Thursdays: 7-9 AM.  First 15 patients are seen on a first come, first serve basis. °  ° °Medicaid-accepting [place] Providers: ° °Organization         Address  Phone   Notes  °[facility] [address], [place] (336) 641-2100 Also accepts self-pay patients.  °[facility] [address], [place] ° (336) 856-9996   °[facility] [address], [place]  (336) 288-8857   °[facility] [address] [place] [address], [place] (336) 299-7000   °Veita Bland [address], [place]  ° (336) 373-1557 Only accepts [place] Access Medicaid patients after they have their name applied to their card.  ° °Self-Pay (no insurance) in [place]: ° °Organization         Address  Phone   Notes  °Sickle Cell Patients, [facility] [address], [place] (336) 832-1970   °[facility] [address], [place] (336) 832-4400   °[facility] ° [address], [place] (336) 992-4800   °[facility]/Dr. Osei-Bonsu ° [address] [place] [address], [place] or [address], [place] (336) 841-8500 Phone number for both [place] and [place] locations is the same.  °[facility] [address], [place] (336) 299-0000   °[facility] [address] [place] [address], [place] or [address] (336) 852-7530 °(336) 878-2260   °[facility] [address], [place] (336) 350-1642, phone; (336) 294-5005, fax Sees patients 1st and 3rd Saturday of every month.  Must not qualify for public or private insurance (i.e. Medicaid, Medicare, [place] Health Choice, Veterans' Benefits) • Household income should be no more than 200% of the poverty level •The clinic cannot treat you if you are pregnant or think you are pregnant • Sexually transmitted diseases are not treated at the clinic.  ° ° °Dental Care: °Organization         Address  Phone  Notes  °[place] [facility] [address],  (336) 641-6152 Accepts children up to age 21 who are enrolled in Medicaid or [place] Health Choice; pregnant women with a Medicaid card; and children who have applied for Medicaid or [place] Health Choice, but were declined, whose parents can pay a reduced fee at time of service.  °[place]  [facility] [place]  [address], [place] (336) 641-7733 Accepts children up to age 21 who are enrolled in Medicaid or [place] Health Choice; pregnant women with a Medicaid card; and children who have applied for Medicaid or  Health Choice, but were declined, whose parents can pay a reduced fee at time of service.  °[facility] ° [address],  (336) 641-4533 Patients are seen by appointment only. Walk-ins are not accepted. [facility] will see patients 18 years of age and older. °Monday - Tuesday (8am-5pm) °Most Wednesdays (8:30-5pm) °$30 per visit, cash only  °[facility] ° [address], [place] (336) 641-4533 Patients are seen by appointment only. Walk-ins are not accepted. [facility] will see patients 18 years of age and older. °One   Wednesday Evening (Monthly: Volunteer Based).  $30 per visit, cash only  °[facility]  (919) 537-3737 for adults; Children under age 4, call Graduate Pediatric Dentistry at ([phone]) [phone]. Children aged 4-14, please call (919) [phone] to request a pediatric application. ° Dental services are provided in all areas of dental care including fillings, crowns and bridges, complete and partial dentures, implants, gum treatment, root canals, and extractions. Preventive care is also provided. Treatment is provided to both adults and children. °Patients are selected via a lottery and there is often a waiting list. °  °[facility] [address], °[place] ° (336) 763-8833 [email or web address] °  °[facility] [address], [place], [place] (336)723-1848, Ext. 123 Second and Fourth Thursday of each month, opens at 6:30 AM; Clinic ends at 9 AM.  Patients are seen on a first-come first-served basis, and a limited number are seen during each clinic.  ° °[facility] ° [address], [place], [place] (336) 723-7904    Eligibility Requirements °You must have lived in [place], or [place] counties for at least the last three months. °  You cannot be eligible for state or federal sponsored healthcare insurance, including Veterans Administration, Medicaid, or Medicare. °  You generally cannot be eligible for healthcare insurance through your employer.  °  How to apply: °Eligibility screenings are held every Tuesday and Wednesday afternoon from 1:00 pm until 4:00 pm. You do not need an appointment for the interview!  °[facility] [address], [place], [place] 336-631-2330   °[facility]  336-342-8273   °[place] [facility]  336-703-3100   °[facility]  336-570-6415   ° °Behavioral Health Resources in the Community: °Intensive Outpatient Programs °Organization         Address  Phone  Notes  °[facility] [address], [place], [place] 336-878-6098   °[facility] [address], [place], [place] 336-832-9800   °[facility] [address], [place], [place] ° 336-882-2125   °[facility] [address],  °[place], [place] 1-800-853-5163 or 336-641-4981   °Substance Abuse Resources °Organization         Address  Phone  Notes  °[facility]  336-882-2125   °[facility]  336-784-9470   °[facility]  336-285-9073   °[facility]  336-845-3988   °Residential & Outpatient Substance Abuse Program  1-800-659-3381   °Psychological Services °Organization         Address  Phone  Notes  °[facility]  336- 832-9600   °[facility]  336- 378-7881   °[facility] [address], [place] 1-800-853-5163 or 336-641-4981   ° °Mobile Crisis Teams °Organization         Address  Phone  Notes  °[facility], Mobile Crisis Care Unit  1-877-626-1772   °Assertive °[facility] ° [address]  [place], [place] 336-834-9664   °Sharon DeEsch [address] °[place] 336-554-5454   ° °Self-Help/Support Groups °Organization         Address  Phone             Notes  °[facility] [place] - variety of support groups  336- 373-1402 Call for more information  °Narcotics Anonymous (NA), [facility] [address], °[place]  2 meetings at this location  ° °  Residential Treatment Programs Organization         Address  Phone  Notes  [facility] [address],    [place]  1-870-082-1614   [facility]  [address] [place] [address], [place], [place]   [place], [place] (314) 688-6155 Admissions: 8am-3pm M-F  [facility] [place] [address]    [place], [place] 778-242-3536   [facility] [address] [place], [place], [place]   [facility] [address],  [place], [place]   [facility] - Intensive Outpatient [address], [place], [place]   [facility] ([place]  [place], [place] 1-978 871 6115 or (707)[phone]   [facility] ([facility]) [address], [place], [place] Accepts Medicaid  [facility] [address]  [place] 1-631-602-2556 Substance Abuse/Addiction Treatment   [facility] Organization         Address  Phone  Notes  [facility]  610-158-4332   Domenic Schwab, PhD [address] [place], [place]   702-459-1647 or 5125827768   [place], [place] 650-485-6718   [facility] [address] [place], [place] 432-614-3402 Insurance/Medicaid/sponsorship through [facility] [address] [place] [place]                                    [place], [place] [address] [place] [address], [place] [phone]    Dr. Adele Schilder  ([phone]   [facility] Dept. 1) [address], [place] 2) [place] 3)  [place] [address], [place] [phone] [phone]  (231) [phone]   [place] 681-337-0977 or [phone] (After Hours)       Take your usual prescriptions as previously directed.  Call your regular medical doctor and the outpatient mental health resources given to you today to schedule a follow up appointment within the next week.  Return to the Emergency Department immediately sooner if worsening.

## 2013-09-07 NOTE — BH Assessment (Signed)
TTS assessment is complete by this Probation officer.  Shaune Pollack, MS, [facility] Assessment Counselor

## 2013-09-07 NOTE — [facility] Counselor (Signed)
Writer spoke w/ EDP Thurnell Garbe re: pt's presentation. Writer spoke w/ pt's Cytogeneticist will do teleassessment at 3:15 pm.  Arnold Long, [place] Assessment Counselor

## 2013-09-07 NOTE — ED Notes (Signed)
Patient made aware that he can not drive for at least 2 hours due to pain medication-patient calling for ride.

## 2013-09-07 NOTE — ED Notes (Signed)
Patient given [employer].

## 2013-09-07 NOTE — ED Provider Notes (Signed)
CSN: [id number]     Arrival date & time 09/07/13  1208 History   First MD Initiated Contact with Patient 09/07/13 1253     Chief Complaint  Patient presents with  . V70.1     HPI Pt was seen at 1300.  Per Police and pt's wife, c/o pt with unknown onset and persistence of constant "depression" and "SI" for the past several days. Police state pt's wife called them today and stated pt has been "depressed," and "threatening to kill himself" over the past several days. Police state pt initially refused evaluation, but then he called them back and asked to "go to the ER for some help." Police followed pt to the ED. Pt only states he "got in an argument with my wife" because "she nags me." Pt is requesting "my pain medicines." Pt denies SI, no HI, no hallucinations.    Past Medical History  Diagnosis Date  . Nerve damage   . High blood pressure   . Difficult or painful urination   . Chronic pain 1999    Bilateral feet (R >L)  . Paresthesia of foot, bilateral   . Weakness of both legs   . Chronic back pain   . Pain management    Past Surgical History  Procedure Laterality Date  . Back surgery    . Hernia repair    . Mastectomy Left   . Arm surgery    . Facial cosmetic surgery    . Kidney stone surgery     Family History  Problem Relation Age of Onset  . Heart disease Father     Living, 73  . Breast cancer Mother     Living, 49  . Hypercholesterolemia Mother   . Healthy Brother   . Healthy Sister    History  Substance Use Topics  . Smoking status: Former Smoker -- 2.00 packs/day for 10 years    Types: Cigarettes  . Smokeless tobacco: Former Systems developer    Quit date: 06/08/1991  . Alcohol Use: No    Review of Systems ROS: Statement: All systems negative except as marked or noted in the HPI; Constitutional: Negative for fever and chills. ; ; Eyes: Negative for eye pain, redness and discharge. ; ; ENMT: Negative for ear pain, hoarseness, nasal congestion, sinus pressure and sore  throat. ; ; Cardiovascular: Negative for chest pain, palpitations, diaphoresis, dyspnea and peripheral edema. ; ; Respiratory: Negative for cough, wheezing and stridor. ; ; Gastrointestinal: Negative for nausea, vomiting, diarrhea, abdominal pain, blood in stool, hematemesis, jaundice and rectal bleeding. . ; ; Genitourinary: Negative for dysuria, flank pain and hematuria. ; ; Musculoskeletal: Negative for back pain and neck pain. Negative for swelling and trauma.; ; Skin: Negative for pruritus, rash, abrasions, blisters, bruising and skin lesion.; ; Neuro: Negative for headache, lightheadedness and neck stiffness. Negative for weakness, altered level of consciousness , altered mental status, extremity weakness, paresthesias, involuntary movement, seizure and syncope.;Psych:  +depression. No SI, no SA, no HI, no hallucinations.       Allergies  Erythromycin  Home Medications   Prior to Admission medications   Medication Sig Start Date End Date Taking? Authorizing Provider  DULoxetine (CYMBALTA) 60 MG capsule Take 60 mg by mouth daily.   Yes Historical Provider, MD  enalapril (VASOTEC) 5 MG tablet Take 5 mg by mouth daily.    Yes Historical Provider, MD  ibuprofen (ADVIL,MOTRIN) 200 MG tablet Take 800 mg by mouth every 8 (eight) hours as needed for moderate pain.  Yes Historical Provider, MD  lidocaine (LIDODERM) 5 % Place 1 patch onto the skin daily. Remove & Discard patch within 12 hours or as directed by MD   Yes Historical Provider, MD  morphine (MS CONTIN) 30 MG 12 hr tablet Take 30 mg by mouth every 12 (twelve) hours.   Yes Historical Provider, MD  oxyCODONE-acetaminophen (PERCOCET) 10-325 MG per tablet Take 1 tablet by mouth daily as needed for pain.  11/24/12  Yes Historical Provider, MD  tamsulosin (FLOMAX) 0.4 MG CAPS capsule Take 0.4 mg by mouth daily.   Yes Historical Provider, MD   BP 118/96  Pulse 106  Temp(Src) 99.1 F (37.3 C) (Oral)  Resp 20  Ht 5\' 10"  (1.778 m)  Wt 200 lb  (90.719 kg)  BMI 28.70 kg/m2  SpO2 100% Physical Exam 1305: Physical examination:  Nursing notes reviewed; Vital signs and O2 SAT reviewed;  Constitutional: Well developed, Well nourished, Well hydrated, In no acute distress; Head:  Normocephalic, atraumatic; Eyes: EOMI, PERRL, No scleral icterus; ENMT: Mouth and pharynx normal, Mucous membranes moist; Neck: Supple, Full range of motion; Cardiovascular: Regular rate and rhythm, No gallop; Respiratory: Breath sounds clear & equal bilaterally, No wheezes.  Speaking full sentences with ease, Normal respiratory effort/excursion; Chest: No defomity, Movement normal; Abdomen: Soft, Nondistended;; Extremities: Pulses normal, No tenderness, No edema, No calf edema or asymmetry.; Neuro: AA&Ox3, Major CN grossly intact.  Speech clear. No gross focal motor deficits in extremities. Climbs on and off chair at bedside easily by himself. Gait steady.; Skin: Color normal, Warm, Dry.; Psych:  Affect flat, poor eye contact. Denies SI.     ED Course  Procedures     MDM  MDM Reviewed: previous chart, nursing note and vitals Interpretation: labs    Results for orders placed during the hospital encounter of 09/07/13  ETHANOL      Result Value Ref Range   Alcohol, Ethyl (B) <11  0 - 11 mg/dL  BASIC METABOLIC PANEL      Result Value Ref Range   Sodium 142  137 - 147 mEq/L   Potassium 4.5  3.7 - 5.3 mEq/L   Chloride 104  96 - 112 mEq/L   CO2 24  19 - 32 mEq/L   Glucose, Bld 98  70 - 99 mg/dL   BUN 19  6 - 23 mg/dL   Creatinine, Ser 1.06  0.50 - 1.35 mg/dL   Calcium 9.9  8.4 - 10.5 mg/dL   GFR calc non Af Amer 74 (*) >90 mL/min   GFR calc Af Amer 86 (*) >90 mL/min   Anion gap 14  5 - 15  CBC WITH DIFFERENTIAL      Result Value Ref Range   WBC 8.0  4.0 - 10.5 K/uL   RBC 5.10  4.22 - 5.81 MIL/uL   Hemoglobin 15.4  13.0 - 17.0 g/dL   HCT 45.8  39.0 - 52.0 %   MCV 89.8  78.0 - 100.0 fL   MCH 30.2  26.0 - 34.0 pg   MCHC 33.6  30.0 - 36.0 g/dL   RDW  12.3  11.5 - 15.5 %   Platelets 235  150 - 400 K/uL   Neutrophils Relative % 67  43 - 77 %   Neutro Abs 5.4  1.7 - 7.7 K/uL   Lymphocytes Relative 23  12 - 46 %   Lymphs Abs 1.9  0.7 - 4.0 K/uL   Monocytes Relative 7  3 - 12 %  Monocytes Absolute 0.6  0.1 - 1.0 K/uL   Eosinophils Relative 2  0 - 5 %   Eosinophils Absolute 0.2  0.0 - 0.7 K/uL   Basophils Relative 1  0 - 1 %   Basophils Absolute 0.1  0.0 - 0.1 K/uL     1600:  TSS Chrys Racer has evaluated pt and spoken with his wife, as well as reviewed case with Psych Dr. Lovena Le:  Pt is depressed but denies SI and has no plan, TTS states pt does not meet inpt criteria at this time and can be discharged with outpt resources. Pt continues to deny SI while in the ED. Will d/c.        Alfonzo Feller, DO 09/09/13 [id number]

## 2013-12-22 ENCOUNTER — Other Ambulatory Visit: Payer: Self-pay | Admitting: Sports Medicine

## 2013-12-22 DIAGNOSIS — M810 Age-related osteoporosis without current pathological fracture: Secondary | ICD-10-CM

## 2014-01-03 ENCOUNTER — Ambulatory Visit
Admission: RE | Admit: 2014-01-03 | Discharge: 2014-01-03 | Disposition: A | Discharge status: Home / Self Care | Payer: Medicare Other | Source: Ambulatory Visit | Attending: Sports Medicine | Admitting: Sports Medicine

## 2014-01-03 DIAGNOSIS — M810 Age-related osteoporosis without current pathological fracture: Secondary | ICD-10-CM

## 2014-04-06 DIAGNOSIS — T859XXA Unspecified complication of internal prosthetic device, implant and graft, initial encounter: Secondary | ICD-10-CM | POA: Insufficient documentation

## 2015-02-15 DIAGNOSIS — M5136 Other intervertebral disc degeneration, lumbar region: Secondary | ICD-10-CM | POA: Diagnosis not present

## 2015-02-15 DIAGNOSIS — G609 Hereditary and idiopathic neuropathy, unspecified: Secondary | ICD-10-CM | POA: Diagnosis not present

## 2015-02-26 DIAGNOSIS — M810 Age-related osteoporosis without current pathological fracture: Secondary | ICD-10-CM | POA: Diagnosis not present

## 2015-02-26 DIAGNOSIS — E559 Vitamin D deficiency, unspecified: Secondary | ICD-10-CM | POA: Diagnosis not present

## 2015-02-26 DIAGNOSIS — R5383 Other fatigue: Secondary | ICD-10-CM | POA: Diagnosis not present

## 2015-04-03 DIAGNOSIS — M810 Age-related osteoporosis without current pathological fracture: Secondary | ICD-10-CM | POA: Diagnosis not present

## 2015-04-03 DIAGNOSIS — E559 Vitamin D deficiency, unspecified: Secondary | ICD-10-CM | POA: Diagnosis not present

## 2015-04-10 DIAGNOSIS — Z0001 Encounter for general adult medical examination with abnormal findings: Secondary | ICD-10-CM | POA: Diagnosis not present

## 2015-04-10 DIAGNOSIS — G2581 Restless legs syndrome: Secondary | ICD-10-CM | POA: Diagnosis not present

## 2015-04-10 DIAGNOSIS — Z1389 Encounter for screening for other disorder: Secondary | ICD-10-CM | POA: Diagnosis not present

## 2015-04-19 DIAGNOSIS — Z5181 Encounter for therapeutic drug level monitoring: Secondary | ICD-10-CM | POA: Diagnosis not present

## 2015-04-19 DIAGNOSIS — M5136 Other intervertebral disc degeneration, lumbar region: Secondary | ICD-10-CM | POA: Diagnosis not present

## 2015-04-19 DIAGNOSIS — G894 Chronic pain syndrome: Secondary | ICD-10-CM | POA: Diagnosis not present

## 2015-05-09 DIAGNOSIS — Z0001 Encounter for general adult medical examination with abnormal findings: Secondary | ICD-10-CM | POA: Diagnosis not present

## 2015-05-09 DIAGNOSIS — E663 Overweight: Secondary | ICD-10-CM | POA: Diagnosis not present

## 2015-05-09 DIAGNOSIS — Z1389 Encounter for screening for other disorder: Secondary | ICD-10-CM | POA: Diagnosis not present

## 2015-05-09 DIAGNOSIS — Z0000 Encounter for general adult medical examination without abnormal findings: Secondary | ICD-10-CM | POA: Diagnosis not present

## 2015-06-17 DIAGNOSIS — G894 Chronic pain syndrome: Secondary | ICD-10-CM | POA: Diagnosis not present

## 2015-06-17 DIAGNOSIS — M51369 Other intervertebral disc degeneration, lumbar region without mention of lumbar back pain or lower extremity pain: Secondary | ICD-10-CM | POA: Insufficient documentation

## 2015-06-17 DIAGNOSIS — Z5181 Encounter for therapeutic drug level monitoring: Secondary | ICD-10-CM | POA: Insufficient documentation

## 2015-06-17 DIAGNOSIS — G609 Hereditary and idiopathic neuropathy, unspecified: Secondary | ICD-10-CM | POA: Diagnosis not present

## 2015-06-17 DIAGNOSIS — M5136 Other intervertebral disc degeneration, lumbar region: Secondary | ICD-10-CM | POA: Diagnosis not present

## 2015-06-21 DIAGNOSIS — G4733 Obstructive sleep apnea (adult) (pediatric): Secondary | ICD-10-CM | POA: Diagnosis not present

## 2015-07-31 DIAGNOSIS — Z1389 Encounter for screening for other disorder: Secondary | ICD-10-CM | POA: Diagnosis not present

## 2015-07-31 DIAGNOSIS — I10 Essential (primary) hypertension: Secondary | ICD-10-CM | POA: Diagnosis not present

## 2015-07-31 DIAGNOSIS — L299 Pruritus, unspecified: Secondary | ICD-10-CM | POA: Diagnosis not present

## 2015-07-31 DIAGNOSIS — W57XXXA Bitten or stung by nonvenomous insect and other nonvenomous arthropods, initial encounter: Secondary | ICD-10-CM | POA: Diagnosis not present

## 2015-07-31 DIAGNOSIS — T07 Unspecified multiple injuries: Secondary | ICD-10-CM | POA: Diagnosis not present

## 2015-09-06 DIAGNOSIS — M5417 Radiculopathy, lumbosacral region: Secondary | ICD-10-CM | POA: Diagnosis not present

## 2015-09-06 DIAGNOSIS — I10 Essential (primary) hypertension: Secondary | ICD-10-CM | POA: Diagnosis not present

## 2015-09-06 DIAGNOSIS — G894 Chronic pain syndrome: Secondary | ICD-10-CM | POA: Diagnosis not present

## 2015-09-11 DIAGNOSIS — M961 Postlaminectomy syndrome, not elsewhere classified: Secondary | ICD-10-CM | POA: Diagnosis not present

## 2015-09-11 DIAGNOSIS — Z5181 Encounter for therapeutic drug level monitoring: Secondary | ICD-10-CM | POA: Diagnosis not present

## 2015-09-11 DIAGNOSIS — G894 Chronic pain syndrome: Secondary | ICD-10-CM | POA: Diagnosis not present

## 2015-09-11 DIAGNOSIS — G609 Hereditary and idiopathic neuropathy, unspecified: Secondary | ICD-10-CM | POA: Diagnosis not present

## 2015-09-11 DIAGNOSIS — Z79899 Other long term (current) drug therapy: Secondary | ICD-10-CM | POA: Diagnosis not present

## 2015-09-20 DIAGNOSIS — M5126 Other intervertebral disc displacement, lumbar region: Secondary | ICD-10-CM | POA: Diagnosis not present

## 2015-09-20 DIAGNOSIS — M4726 Other spondylosis with radiculopathy, lumbar region: Secondary | ICD-10-CM | POA: Diagnosis not present

## 2015-09-20 DIAGNOSIS — M4727 Other spondylosis with radiculopathy, lumbosacral region: Secondary | ICD-10-CM | POA: Diagnosis not present

## 2015-09-20 DIAGNOSIS — M5127 Other intervertebral disc displacement, lumbosacral region: Secondary | ICD-10-CM | POA: Diagnosis not present

## 2015-10-29 DIAGNOSIS — M79604 Pain in right leg: Secondary | ICD-10-CM | POA: Diagnosis not present

## 2015-10-29 DIAGNOSIS — Z5181 Encounter for therapeutic drug level monitoring: Secondary | ICD-10-CM | POA: Diagnosis not present

## 2015-10-29 DIAGNOSIS — M961 Postlaminectomy syndrome, not elsewhere classified: Secondary | ICD-10-CM | POA: Diagnosis not present

## 2015-11-22 DIAGNOSIS — Z79899 Other long term (current) drug therapy: Secondary | ICD-10-CM | POA: Diagnosis not present

## 2015-11-22 DIAGNOSIS — G5790 Unspecified mononeuropathy of unspecified lower limb: Secondary | ICD-10-CM | POA: Diagnosis not present

## 2015-11-22 DIAGNOSIS — M79604 Pain in right leg: Secondary | ICD-10-CM | POA: Diagnosis not present

## 2015-11-22 DIAGNOSIS — M79605 Pain in left leg: Secondary | ICD-10-CM | POA: Diagnosis not present

## 2015-11-22 DIAGNOSIS — M5136 Other intervertebral disc degeneration, lumbar region: Secondary | ICD-10-CM | POA: Diagnosis not present

## 2015-11-22 DIAGNOSIS — M545 Low back pain: Secondary | ICD-10-CM | POA: Diagnosis not present

## 2015-11-22 DIAGNOSIS — Z5181 Encounter for therapeutic drug level monitoring: Secondary | ICD-10-CM | POA: Diagnosis not present

## 2015-11-27 DIAGNOSIS — M545 Low back pain, unspecified: Secondary | ICD-10-CM | POA: Insufficient documentation

## 2015-11-27 DIAGNOSIS — G8929 Other chronic pain: Secondary | ICD-10-CM | POA: Insufficient documentation

## 2015-12-04 DIAGNOSIS — M810 Age-related osteoporosis without current pathological fracture: Secondary | ICD-10-CM | POA: Diagnosis not present

## 2015-12-04 DIAGNOSIS — R5383 Other fatigue: Secondary | ICD-10-CM | POA: Diagnosis not present

## 2015-12-04 DIAGNOSIS — Z23 Encounter for immunization: Secondary | ICD-10-CM | POA: Diagnosis not present

## 2015-12-04 DIAGNOSIS — E559 Vitamin D deficiency, unspecified: Secondary | ICD-10-CM | POA: Diagnosis not present

## 2015-12-25 DIAGNOSIS — G629 Polyneuropathy, unspecified: Secondary | ICD-10-CM | POA: Diagnosis not present

## 2015-12-25 DIAGNOSIS — G5790 Unspecified mononeuropathy of unspecified lower limb: Secondary | ICD-10-CM | POA: Diagnosis not present

## 2015-12-25 DIAGNOSIS — G894 Chronic pain syndrome: Secondary | ICD-10-CM | POA: Diagnosis not present

## 2016-01-06 ENCOUNTER — Other Ambulatory Visit: Payer: Self-pay | Admitting: Sports Medicine

## 2016-01-06 DIAGNOSIS — M810 Age-related osteoporosis without current pathological fracture: Secondary | ICD-10-CM

## 2016-01-08 DIAGNOSIS — E559 Vitamin D deficiency, unspecified: Secondary | ICD-10-CM | POA: Diagnosis not present

## 2016-01-08 DIAGNOSIS — M810 Age-related osteoporosis without current pathological fracture: Secondary | ICD-10-CM | POA: Diagnosis not present

## 2016-01-13 ENCOUNTER — Ambulatory Visit
Admission: RE | Admit: 2016-01-13 | Discharge: 2016-01-13 | Disposition: A | Discharge status: Home / Self Care | Payer: Medicare Other | Source: Ambulatory Visit | Attending: Sports Medicine | Admitting: Sports Medicine

## 2016-01-13 DIAGNOSIS — M810 Age-related osteoporosis without current pathological fracture: Secondary | ICD-10-CM

## 2016-01-15 DIAGNOSIS — E559 Vitamin D deficiency, unspecified: Secondary | ICD-10-CM | POA: Diagnosis not present

## 2016-01-15 DIAGNOSIS — M810 Age-related osteoporosis without current pathological fracture: Secondary | ICD-10-CM | POA: Diagnosis not present

## 2016-03-20 DIAGNOSIS — G894 Chronic pain syndrome: Secondary | ICD-10-CM | POA: Diagnosis not present

## 2016-04-21 DIAGNOSIS — G894 Chronic pain syndrome: Secondary | ICD-10-CM | POA: Diagnosis not present

## 2016-04-21 DIAGNOSIS — Z125 Encounter for screening for malignant neoplasm of prostate: Secondary | ICD-10-CM | POA: Diagnosis not present

## 2016-04-21 DIAGNOSIS — G64 Other disorders of peripheral nervous system: Secondary | ICD-10-CM | POA: Diagnosis not present

## 2016-04-21 DIAGNOSIS — I10 Essential (primary) hypertension: Secondary | ICD-10-CM | POA: Diagnosis not present

## 2016-04-21 DIAGNOSIS — Z0000 Encounter for general adult medical examination without abnormal findings: Secondary | ICD-10-CM | POA: Diagnosis not present

## 2016-04-21 DIAGNOSIS — E782 Mixed hyperlipidemia: Secondary | ICD-10-CM | POA: Diagnosis not present

## 2016-04-21 DIAGNOSIS — Z0001 Encounter for general adult medical examination with abnormal findings: Secondary | ICD-10-CM | POA: Diagnosis not present

## 2016-04-21 DIAGNOSIS — E785 Hyperlipidemia, unspecified: Secondary | ICD-10-CM | POA: Diagnosis not present

## 2016-04-21 DIAGNOSIS — Z1389 Encounter for screening for other disorder: Secondary | ICD-10-CM | POA: Diagnosis not present

## 2016-06-05 DIAGNOSIS — G5790 Unspecified mononeuropathy of unspecified lower limb: Secondary | ICD-10-CM | POA: Diagnosis not present

## 2016-06-05 DIAGNOSIS — M5136 Other intervertebral disc degeneration, lumbar region: Secondary | ICD-10-CM | POA: Diagnosis not present

## 2016-06-05 DIAGNOSIS — Z5181 Encounter for therapeutic drug level monitoring: Secondary | ICD-10-CM | POA: Diagnosis not present

## 2016-06-05 DIAGNOSIS — G894 Chronic pain syndrome: Secondary | ICD-10-CM | POA: Diagnosis not present

## 2016-06-05 DIAGNOSIS — Z79899 Other long term (current) drug therapy: Secondary | ICD-10-CM | POA: Diagnosis not present

## 2016-06-10 DIAGNOSIS — I10 Essential (primary) hypertension: Secondary | ICD-10-CM | POA: Diagnosis not present

## 2016-07-16 DIAGNOSIS — R5383 Other fatigue: Secondary | ICD-10-CM | POA: Diagnosis not present

## 2016-07-16 DIAGNOSIS — E559 Vitamin D deficiency, unspecified: Secondary | ICD-10-CM | POA: Diagnosis not present

## 2016-07-16 DIAGNOSIS — M810 Age-related osteoporosis without current pathological fracture: Secondary | ICD-10-CM | POA: Diagnosis not present

## 2016-07-22 DIAGNOSIS — M810 Age-related osteoporosis without current pathological fracture: Secondary | ICD-10-CM | POA: Diagnosis not present

## 2016-07-22 DIAGNOSIS — E559 Vitamin D deficiency, unspecified: Secondary | ICD-10-CM | POA: Diagnosis not present

## 2016-07-24 DIAGNOSIS — G894 Chronic pain syndrome: Secondary | ICD-10-CM | POA: Diagnosis not present

## 2016-07-24 DIAGNOSIS — Z79899 Other long term (current) drug therapy: Secondary | ICD-10-CM | POA: Diagnosis not present

## 2016-07-24 DIAGNOSIS — Z5181 Encounter for therapeutic drug level monitoring: Secondary | ICD-10-CM | POA: Diagnosis not present

## 2016-07-24 DIAGNOSIS — M79604 Pain in right leg: Secondary | ICD-10-CM | POA: Diagnosis not present

## 2016-08-19 DIAGNOSIS — G894 Chronic pain syndrome: Secondary | ICD-10-CM | POA: Diagnosis not present

## 2016-10-06 DIAGNOSIS — H35372 Puckering of macula, left eye: Secondary | ICD-10-CM | POA: Diagnosis not present

## 2016-10-06 DIAGNOSIS — H1851 Endothelial corneal dystrophy: Secondary | ICD-10-CM | POA: Diagnosis not present

## 2016-10-06 DIAGNOSIS — Z961 Presence of intraocular lens: Secondary | ICD-10-CM | POA: Diagnosis not present

## 2016-10-30 DIAGNOSIS — H43811 Vitreous degeneration, right eye: Secondary | ICD-10-CM | POA: Diagnosis not present

## 2016-10-30 DIAGNOSIS — H35373 Puckering of macula, bilateral: Secondary | ICD-10-CM | POA: Diagnosis not present

## 2016-10-30 DIAGNOSIS — H43392 Other vitreous opacities, left eye: Secondary | ICD-10-CM | POA: Diagnosis not present

## 2016-11-16 DIAGNOSIS — Z23 Encounter for immunization: Secondary | ICD-10-CM | POA: Diagnosis not present

## 2016-12-08 DIAGNOSIS — Z1389 Encounter for screening for other disorder: Secondary | ICD-10-CM | POA: Diagnosis not present

## 2016-12-08 DIAGNOSIS — G894 Chronic pain syndrome: Secondary | ICD-10-CM | POA: Diagnosis not present

## 2016-12-08 DIAGNOSIS — M2550 Pain in unspecified joint: Secondary | ICD-10-CM | POA: Diagnosis not present

## 2016-12-08 DIAGNOSIS — I10 Essential (primary) hypertension: Secondary | ICD-10-CM | POA: Diagnosis not present

## 2017-01-11 DIAGNOSIS — R5383 Other fatigue: Secondary | ICD-10-CM | POA: Diagnosis not present

## 2017-01-11 DIAGNOSIS — E559 Vitamin D deficiency, unspecified: Secondary | ICD-10-CM | POA: Diagnosis not present

## 2017-01-11 DIAGNOSIS — M810 Age-related osteoporosis without current pathological fracture: Secondary | ICD-10-CM | POA: Diagnosis not present

## 2017-01-14 ENCOUNTER — Ambulatory Visit (INDEPENDENT_AMBULATORY_CARE_PROVIDER_SITE_OTHER): Payer: Medicare Other | Admitting: Physical Medicine and Rehabilitation

## 2017-01-14 ENCOUNTER — Encounter (INDEPENDENT_AMBULATORY_CARE_PROVIDER_SITE_OTHER): Payer: Self-pay | Admitting: Physical Medicine and Rehabilitation

## 2017-01-14 VITALS — BP 139/91 | HR 76

## 2017-01-14 DIAGNOSIS — M79671 Pain in right foot: Secondary | ICD-10-CM

## 2017-01-14 DIAGNOSIS — M79672 Pain in left foot: Secondary | ICD-10-CM

## 2017-01-14 DIAGNOSIS — G629 Polyneuropathy, unspecified: Secondary | ICD-10-CM | POA: Diagnosis not present

## 2017-01-14 DIAGNOSIS — Z9689 Presence of other specified functional implants: Secondary | ICD-10-CM

## 2017-01-14 DIAGNOSIS — F1120 Opioid dependence, uncomplicated: Secondary | ICD-10-CM | POA: Diagnosis not present

## 2017-01-14 DIAGNOSIS — M961 Postlaminectomy syndrome, not elsewhere classified: Secondary | ICD-10-CM

## 2017-01-14 DIAGNOSIS — G894 Chronic pain syndrome: Secondary | ICD-10-CM | POA: Diagnosis not present

## 2017-01-14 DIAGNOSIS — Z9889 Other specified postprocedural states: Secondary | ICD-10-CM | POA: Diagnosis not present

## 2017-01-14 NOTE — Progress Notes (Deleted)
Bilateral foot pain. Has some pain in lower back. No numbness or tingling. Has had foot pain for 10-15 years. Has been in pain management, most recently [place]. Has an SCS which does not really help. Has had injections without any relief.

## 2017-01-20 ENCOUNTER — Encounter (INDEPENDENT_AMBULATORY_CARE_PROVIDER_SITE_OTHER): Payer: Self-pay | Admitting: Physical Medicine and Rehabilitation

## 2017-01-20 NOTE — Progress Notes (Signed)
Luke Foster - 66 y.o. male MRN [id number]  Date of birth: 03-24-1950  Office Visit Note: Visit Date: 01/14/2017 PCP: [name], MD Referred by: [name], MD  Subjective: Chief Complaint  Patient presents with  . Lower Back - Pain  . Right Foot - Pain, Numbness  . 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 Vertell Limber many years ago.  He went on to have a second surgery which was likely above this level by Dr. Saintclair Halsted.  With failure of the back surgeries and with progressive bilateral foot pain he went on to have a spinal cord stimulator placed by Dr. Marcial Pacas.  He reports the [employer] never has helped at all for his back or foot pain.  He comes in today because a friend of his had an ablation type procedure done in the cervical spine region for arm pain and I have physician in [place].  He does not know the name of the physician or the actual procedure that was done.  His biggest complaint is bilateral chronic worsening severe foot pain and numbness and paresthesia.  He feels like he cannot really wear anything other than slippers on his feet and has a hard time with even socks or the sheets on the bed.  He has some swelling and color change.  He is noticed some weakness bilaterally but no foot drop.  He has been followed by neurology in the past and has had electrodiagnostic studies.  This is essentially an idiopathic polyneuropathy as diagnosed.  His pain course has been complicated by seeing various practitioners over the years.  His last pain management physician was Dr. Wess Botts at the [facility] at [facility].  The patient tells me that he was getting frustrated with the [facility] program because they just wanted to give him pain medicines without any evaluation.  He states that the  physician would rarely see him and that he would come in and they would just refill medications.  He reports that his goal is to be pain-free.  I am able to see Dr. Kirke Corin notes and according to those the last time he was seen was in June of this year.  There was a missed pill count at the time and they referred the patient to behavioral health and did not want to see him until the reports back from behavioral health potential opioid use disorder.  It also appears in reading the notes that the patient really did not want to go see behavioral health and I do not think that was ever completed.  During the time under their care he was tried with various medications including Nucynta and Dilaudid and oxycodone.  The patient reports to me today that his primary care physician Dr. Gerarda Fraction has been providing tramadol, oxycodone 10 mg as well as Valium.  He takes the oxycodone up to 4 times per day.  He takes the tramadol as needed.  He takes the Valium 1/day.  He has not reported any new trauma or falls.  His biggest reason for evaluation was to see if there was anything that could be done for his bilateral foot pain.  He has had anticonvulsant treatment as well as norepinephrine modifying medications without relief.  He reports that his pain is a 7 or 8 out  of 10 most of the time.  His symptoms are worse with standing and ambulating but are present constantly.  He endorses dysesthesia but not really numbness.  Foot pain is been present for 10-15 years.  The pain can be a searing hot pain and sharp pain at times.  He also endorses ongoing lower back pain.  He has had spinal cord stimulator as well as ablations in the lower back for facet pain.    Review of Systems  Constitutional: Positive for malaise/fatigue. Negative for chills, fever and weight loss.  HENT: Negative for hearing loss and sinus pain.   Eyes: Negative for blurred vision, double vision and photophobia.  Respiratory: Negative for cough and shortness  of breath.   Cardiovascular: Negative for chest pain, palpitations and leg swelling.  Gastrointestinal: Negative for abdominal pain, nausea and vomiting.  Genitourinary: Negative for flank pain.  Musculoskeletal: Positive for back pain. Negative for myalgias.       Bilateral foot pain  Skin: Negative for itching and rash.  Neurological: Positive for weakness. Negative for tremors and focal weakness.  Endo/Heme/Allergies: Negative.   Psychiatric/Behavioral: Negative for depression.  All other systems reviewed and are negative.  Otherwise per HPI.  Assessment & Plan: Visit Diagnoses:  1. Pain in left foot   2. Pain in right foot   3. Neuropathy   4. Chronic pain syndrome   5. Post laminectomy syndrome   6. Status post insertion of spinal cord stimulator   7. Uncomplicated opioid dependence ([place])     Plan: Findings:  Complicated chronic long-term chronic pain syndrome with prior lumbar fusion from L4-S1.  Likely had decompression above this level.  Ongoing back pain for 20 years.  Spinal cord stimulator per the patient has never helped.  Patient has had ongoing bilateral foot pain for 10-15 years chronically.  Diagnosed as idiopathic peripheral polyneuropathy.  From a clinical standpoint there could be some sympathetically mediated pain with the changes consistent with may be a complex regional pain syndrome of both feet.  He has been treated by [facility] pain Institute and Dr. Wess Botts.  There is some conflict between what the patient is saying and what the notes say in terms of reasons he quit seeing them and the treatment provided.  He currently is taking oxycodone 10 mg, tramadol and Valium through his primary care physician.  He is not looking for changes in medications he is wanting to know if there is any other procedures that may help his bilateral foot pain.  In particular he is asking about some ablation procedure potentially.  I discussed with him at length to my knowledge about ablation  procedures and current state of treatment and I do not know of anything specific that would help.  I did talk to them about complex regional pain syndrome and I am unsure of the particular diagnosis.  If this was sympathetically mediated someone could entertain the idea of sympathetic chain/ganglion ablation.  I do not do those in my office and have no experience with that.  The patient also mentions that he would like to have the stimulator removed at some point at least the generator as it is no longer helping him.  I think the best approach here is to have him obtain a consultation with Dr. Clydell Hakim [facility].  Dr. Maryjean Ka trained in the same [facility] program and he could see the patient at least on consultation to provide some answers to his questions.  The  patient is pleased with his medicines from his primary care physician who is known for a long time.  Greater than 50% of this visit (total duration of visit 45 minutes) was spent in counseling and coordination of care discussing above diagnostic and therapeutic challenges.  I also did check the [place] controlled substance database.  Patient does seem to get his medications only from those providers over the last year have been Dr. Wess Botts and Dr. Gerarda Fraction.    Meds & Orders: No orders of the defined types were placed in this encounter.   Orders Placed This Encounter  Procedures  . Ambulatory referral to Anesthesiology    Follow-up: Return if symptoms worsen or fail to improve.   Procedures: No procedures performed  No notes on file   Clinical History: FINDINGS: CERVICAL, THORACIC AND LUMBAR MYELOGRAM FINDINGS:  Status post L4-S1 fusion. Solid arthrodesis. Annular bulging at L1-2, L2-3, and L3-4.  Dorsal column stimulator with electrode array posteriorly in the midline of the lower thoracic region. No cord compression.  No cervical spinal stenosis.  No cord compression or malalignment.  CT  CERVICAL MYELOGRAM FINDINGS:  Mild disc space narrowing and uncinate spurring at C5-6 and C6-7. Bilateral uncinate spurring C3-C4. Slight loss of vertebral body height anteriorly at C7, possible old compression deformity. Coarsened trabecular markings suggesting osteopenia. No compressive cord lesion. No evidence for spinal stenosis at any level. No neck masses. Mild atheromatous change at the carotid bifurcations. No thyroid masses.  CT LUMBAR MYELOGRAM FINDINGS:  For solid L4-S1 fusion with instrumentation. Mild annular bulging L1-2, L2-3, and L3-4 without compressive lesion. Lower lumbar facet arthropathy most prominent at L2-3 and L3-4 without significant stenosis. Chronic compression deformity L1 and T12. Conus ends at L1. Coarsened trabecular markings suggesting osteopenia.  CT THORACIC MYELOGRAM FINDINGS:  Dorsal column stimulator electrode array lies opposite T8 and T9 where there is a small superimposed central protrusion. There is slight effacement of the anterior subarachnoid space with minimal, but probably insignificant, anterior cord flattening. Mild compression deformity T12 appears chronic. No other thoracic disc protrusions. No intraspinal mass lesion. No cord enlargement or focal atrophy. Coarsened osseous trabecular markings suggest advanced osteopenia.No lung lesions.  IMPRESSION: CERVICAL, THORACIC AND LUMBAR MYELOGRAM IMPRESSION:  No dominant spinal cord or lumbosacral compressive abnormality is seen.  CT CERVICAL MYELOGRAM IMPRESSION:  No cervical cord compressive lesion is identified. Mild spondylosis as described most notable C5-6 and C6-7. Chronic compression deformity C7.  CT LUMBAR MYELOGRAM IMPRESSION:  Solid L4-S1 fusion. Chronic compression deformity L1. Mild facet arthropathy L2-3 and L3-4 with annular bulging is noncompressive.  CT THORACIC MYELOGRAM IMPRESSION:  Shallow central protrusion T8-9 lies opposite the  appropriately located dorsal column stimulator electrode array. Slight effacement anterior subarachnoid space with only minimal anterior cord flattening. Correlate clinically for relation to hip flexor weakness; doubt significant.  Chronic compression deformity T12. Coarsened trabecular markings at multiple levels. Recommend DEXA examination with appropriate treatment based on results.   Electronically Signed   By: Rolla Flatten M.D.   On: 11/18/2012 17:01  He reports that he has quit smoking. His smoking use included cigarettes. He has a 20.00 pack-year smoking history. He quit smokeless tobacco use about 25 years ago. No results for input(s): HGBA1C, LABURIC in the last 8760 hours.  Objective:  VS:  HT:    WT:   BMI:     BP:(!) 139/91  HR:76bpm  TEMP: ( )  RESP:  Physical Exam  Constitutional: He is oriented to person, place, and  time. He appears well-developed and well-nourished. No distress.  HENT:  Head: Normocephalic and atraumatic.  Nose: Nose normal.  Mouth/Throat: Oropharynx is clear and moist.  Eyes: Conjunctivae are normal. Pupils are equal, round, and reactive to light.  Neck: Neck supple. No JVD present. No tracheal deviation present.  Forward flexed cervical spine reduced range of motion with rotation at end ranges.  Cardiovascular: Regular rhythm and intact distal pulses.  Pulmonary/Chest: Effort normal and breath sounds normal.  Abdominal: Soft. He exhibits no distension.  Musculoskeletal: He exhibits no deformity.  Patient is slow to rise from a seated position.  He ambulates slowly without aid.  He has no pain with hip rotation internal or external.  He has tenderness over the greater trochanters but not exquisite.  He has no clonus bilaterally.  He has good strength in the legs.  Neurological: He is alert and oriented to person, place, and time. He exhibits normal muscle tone. Coordination normal.  Examining both feet shows some intrinsic foot muscle  wasting.  There is mild edema.  There is clearly allodynia present to touch.  There does not appear to be significant trophic changes but there does appear to be some mild trophic changes.  He has good strength in the feet bilaterally.  Skin: Skin is warm. Capillary refill takes 2 to 3 seconds. No rash noted.  Psychiatric: He has a normal mood and affect. His behavior is normal.  Nursing note and vitals reviewed.   Ortho Exam Imaging: No results found.  Past Medical/Family/Surgical/Social History: Medications & Allergies reviewed per EMR Patient Active Problem List   Diagnosis Date Noted  . Neuropathy 01/21/2017  . Pain in right foot 01/21/2017  . Pain in left foot 01/21/2017  . Chronic pain syndrome 01/21/2017  . Post laminectomy syndrome 01/21/2017  . Status post insertion of spinal cord stimulator 01/21/2017  . Uncomplicated opioid dependence ([place]) 01/21/2017  . Lumbosacral radiculopathy 12/09/2012   Past Medical History:  Diagnosis Date  . Chronic back pain   . Chronic pain 1999   Bilateral feet (R >L)  . Difficult or painful urination   . High blood pressure   . Nerve damage   . Pain management   . Paresthesia of foot, bilateral   . Weakness of both legs    Family History  Problem Relation Age of Onset  . Heart disease Father        Living, 71  . Breast cancer Mother        Living, 67  . Hypercholesterolemia Mother   . Healthy Brother   . Healthy Sister    Past Surgical History:  Procedure Laterality Date  . arm surgery    . BACK SURGERY    . FACIAL COSMETIC SURGERY    . HERNIA REPAIR    . KIDNEY STONE SURGERY    . MASTECTOMY Left    Social History   Occupational History  . Not on file  Tobacco Use  . Smoking status: Former Smoker    Packs/day: 2.00    Years: 10.00    Pack years: 20.00    Types: Cigarettes  . Smokeless tobacco: Former Systems developer    Quit date: 06/08/1991  Substance and Sexual Activity  . Alcohol use: No  . Drug use: No  . Sexual  activity: Not on file

## 2017-01-21 ENCOUNTER — Encounter (INDEPENDENT_AMBULATORY_CARE_PROVIDER_SITE_OTHER): Payer: Self-pay | Admitting: Physical Medicine and Rehabilitation

## 2017-01-21 DIAGNOSIS — G894 Chronic pain syndrome: Secondary | ICD-10-CM | POA: Insufficient documentation

## 2017-01-21 DIAGNOSIS — M79672 Pain in left foot: Secondary | ICD-10-CM | POA: Insufficient documentation

## 2017-01-21 DIAGNOSIS — M961 Postlaminectomy syndrome, not elsewhere classified: Secondary | ICD-10-CM | POA: Insufficient documentation

## 2017-01-21 DIAGNOSIS — F1120 Opioid dependence, uncomplicated: Secondary | ICD-10-CM | POA: Insufficient documentation

## 2017-01-21 DIAGNOSIS — M79671 Pain in right foot: Secondary | ICD-10-CM | POA: Insufficient documentation

## 2017-01-21 DIAGNOSIS — Z9689 Presence of other specified functional implants: Secondary | ICD-10-CM | POA: Insufficient documentation

## 2017-01-21 DIAGNOSIS — G629 Polyneuropathy, unspecified: Secondary | ICD-10-CM | POA: Insufficient documentation

## 2017-01-21 DIAGNOSIS — Z9889 Other specified postprocedural states: Secondary | ICD-10-CM

## 2017-01-22 DIAGNOSIS — M810 Age-related osteoporosis without current pathological fracture: Secondary | ICD-10-CM | POA: Diagnosis not present

## 2017-01-22 DIAGNOSIS — E559 Vitamin D deficiency, unspecified: Secondary | ICD-10-CM | POA: Diagnosis not present

## 2017-01-27 ENCOUNTER — Encounter (INDEPENDENT_AMBULATORY_CARE_PROVIDER_SITE_OTHER): Payer: Medicare Other | Admitting: Physical Medicine and Rehabilitation

## 2017-03-09 DIAGNOSIS — G894 Chronic pain syndrome: Secondary | ICD-10-CM | POA: Diagnosis not present

## 2017-03-09 DIAGNOSIS — G629 Polyneuropathy, unspecified: Secondary | ICD-10-CM | POA: Diagnosis not present

## 2017-04-05 DIAGNOSIS — G4733 Obstructive sleep apnea (adult) (pediatric): Secondary | ICD-10-CM | POA: Diagnosis not present

## 2017-04-14 DIAGNOSIS — Z1389 Encounter for screening for other disorder: Secondary | ICD-10-CM | POA: Diagnosis not present

## 2017-04-14 DIAGNOSIS — G894 Chronic pain syndrome: Secondary | ICD-10-CM | POA: Diagnosis not present

## 2017-04-14 DIAGNOSIS — Z6822 Body mass index (BMI) 22.0-22.9, adult: Secondary | ICD-10-CM | POA: Diagnosis not present

## 2017-04-26 DIAGNOSIS — Z0001 Encounter for general adult medical examination with abnormal findings: Secondary | ICD-10-CM | POA: Diagnosis not present

## 2017-04-26 DIAGNOSIS — G894 Chronic pain syndrome: Secondary | ICD-10-CM | POA: Diagnosis not present

## 2017-04-26 DIAGNOSIS — Z23 Encounter for immunization: Secondary | ICD-10-CM | POA: Diagnosis not present

## 2017-04-26 DIAGNOSIS — G629 Polyneuropathy, unspecified: Secondary | ICD-10-CM | POA: Diagnosis not present

## 2017-04-26 DIAGNOSIS — Z6822 Body mass index (BMI) 22.0-22.9, adult: Secondary | ICD-10-CM | POA: Diagnosis not present

## 2017-05-31 ENCOUNTER — Other Ambulatory Visit (HOSPITAL_COMMUNITY): Payer: Self-pay | Admitting: Physician Assistant

## 2017-05-31 ENCOUNTER — Ambulatory Visit (HOSPITAL_COMMUNITY)
Admission: RE | Admit: 2017-05-31 | Discharge: 2017-05-31 | Disposition: A | Discharge status: Home / Self Care | Payer: Medicare Other | Source: Ambulatory Visit | Attending: Physician Assistant | Admitting: Physician Assistant

## 2017-05-31 DIAGNOSIS — R600 Localized edema: Secondary | ICD-10-CM | POA: Diagnosis not present

## 2017-05-31 DIAGNOSIS — Z1389 Encounter for screening for other disorder: Secondary | ICD-10-CM | POA: Diagnosis not present

## 2017-05-31 DIAGNOSIS — M7989 Other specified soft tissue disorders: Secondary | ICD-10-CM | POA: Insufficient documentation

## 2017-05-31 DIAGNOSIS — I8001 Phlebitis and thrombophlebitis of superficial vessels of right lower extremity: Secondary | ICD-10-CM | POA: Insufficient documentation

## 2017-05-31 DIAGNOSIS — Z6822 Body mass index (BMI) 22.0-22.9, adult: Secondary | ICD-10-CM | POA: Diagnosis not present

## 2017-07-21 DIAGNOSIS — E559 Vitamin D deficiency, unspecified: Secondary | ICD-10-CM | POA: Diagnosis not present

## 2017-07-21 DIAGNOSIS — M810 Age-related osteoporosis without current pathological fracture: Secondary | ICD-10-CM | POA: Diagnosis not present

## 2017-07-21 DIAGNOSIS — R5383 Other fatigue: Secondary | ICD-10-CM | POA: Diagnosis not present

## 2017-08-03 DIAGNOSIS — M810 Age-related osteoporosis without current pathological fracture: Secondary | ICD-10-CM | POA: Diagnosis not present

## 2017-08-03 DIAGNOSIS — E559 Vitamin D deficiency, unspecified: Secondary | ICD-10-CM | POA: Diagnosis not present

## 2017-08-06 DIAGNOSIS — Z6822 Body mass index (BMI) 22.0-22.9, adult: Secondary | ICD-10-CM | POA: Diagnosis not present

## 2017-08-06 DIAGNOSIS — L03011 Cellulitis of right finger: Secondary | ICD-10-CM | POA: Diagnosis not present

## 2017-08-14 ENCOUNTER — Emergency Department (HOSPITAL_COMMUNITY)
Admission: EM | Admit: 2017-08-14 | Discharge: 2017-08-15 | Disposition: A | Discharge status: Home / Self Care | Payer: Medicare Other | Attending: Emergency Medicine | Admitting: Emergency Medicine

## 2017-08-14 ENCOUNTER — Other Ambulatory Visit: Payer: Self-pay

## 2017-08-14 ENCOUNTER — Encounter (HOSPITAL_COMMUNITY): Payer: Self-pay | Admitting: Emergency Medicine

## 2017-08-14 DIAGNOSIS — R339 Retention of urine, unspecified: Secondary | ICD-10-CM

## 2017-08-14 DIAGNOSIS — R319 Hematuria, unspecified: Secondary | ICD-10-CM | POA: Insufficient documentation

## 2017-08-14 DIAGNOSIS — Z87891 Personal history of nicotine dependence: Secondary | ICD-10-CM | POA: Insufficient documentation

## 2017-08-14 DIAGNOSIS — Z79899 Other long term (current) drug therapy: Secondary | ICD-10-CM | POA: Insufficient documentation

## 2017-08-14 DIAGNOSIS — N390 Urinary tract infection, site not specified: Secondary | ICD-10-CM | POA: Insufficient documentation

## 2017-08-14 NOTE — ED Triage Notes (Signed)
Pt has prostate issues and states he has not urinated in 24 hours. Tachycardic and in pain.

## 2017-08-15 ENCOUNTER — Emergency Department (HOSPITAL_COMMUNITY): Payer: Medicare Other

## 2017-08-15 DIAGNOSIS — R319 Hematuria, unspecified: Secondary | ICD-10-CM | POA: Diagnosis not present

## 2017-08-15 LAB — URINALYSIS, ROUTINE W REFLEX MICROSCOPIC
Glucose, UA: NEGATIVE mg/dL
Ketones, ur: 15 mg/dL — AB
Leukocytes, UA: NEGATIVE
Nitrite: POSITIVE — AB
Protein, ur: >300 mg/dL — AB
Specific Gravity, Urine: 1.02 (ref 1.005–1.030)
pH: 7 (ref 5.0–8.0)

## 2017-08-15 LAB — URINALYSIS, MICROSCOPIC (REFLEX)
RBC / HPF: >50 RBC/hpf (ref 0–5)
WBC, UA: NONE SEEN WBC/hpf (ref 0–5)

## 2017-08-15 LAB — CK: Total CK: 301 U/L (ref 49–397)

## 2017-08-15 LAB — CBC
HCT: 48.8 % (ref 39.0–52.0)
Hemoglobin: 15.5 g/dL (ref 13.0–17.0)
MCH: 29.5 pg (ref 26.0–34.0)
MCHC: 31.8 g/dL (ref 30.0–36.0)
MCV: 92.8 fL (ref 78.0–100.0)
Platelets: 227 10*3/uL (ref 150–400)
RBC: 5.26 MIL/uL (ref 4.22–5.81)
RDW: 12 % (ref 11.5–15.5)
WBC: 6.5 10*3/uL (ref 4.0–10.5)

## 2017-08-15 LAB — BASIC METABOLIC PANEL
Anion gap: 12 (ref 5–15)
BUN: 14 mg/dL (ref 8–23)
CO2: 20 mmol/L — ABNORMAL LOW (ref 22–32)
Calcium: 9.2 mg/dL (ref 8.9–10.3)
Chloride: 103 mmol/L (ref 98–111)
Creatinine, Ser: 1.53 mg/dL — ABNORMAL HIGH (ref 0.61–1.24)
GFR calc Af Amer: 53 mL/min — ABNORMAL LOW (ref >60)
GFR calc non Af Amer: 46 mL/min — ABNORMAL LOW (ref >60)
Glucose, Bld: 100 mg/dL — ABNORMAL HIGH (ref 70–99)
Potassium: 4 mmol/L (ref 3.5–5.1)
Sodium: 135 mmol/L (ref 135–145)

## 2017-08-15 LAB — MAGNESIUM: Magnesium: 1.7 mg/dL (ref 1.7–2.4)

## 2017-08-15 MED ORDER — SODIUM CHLORIDE 0.9 % IV SOLN
1.0000 g | Freq: Once | INTRAVENOUS | Status: AC
  Administered 2017-08-15: 1 g via INTRAVENOUS
  Filled 2017-08-15: qty 10

## 2017-08-15 MED ORDER — SODIUM CHLORIDE 0.9 % IV BOLUS (SEPSIS)
1000.0000 mL | Freq: Once | INTRAVENOUS | Status: AC
  Administered 2017-08-15: 1000 mL via INTRAVENOUS

## 2017-08-15 MED ORDER — OXYCODONE-ACETAMINOPHEN 5-325 MG PO TABS
2.0000 | ORAL_TABLET | Freq: Once | ORAL | Status: AC
  Administered 2017-08-15: 2 via ORAL
  Filled 2017-08-15: qty 2

## 2017-08-15 MED ORDER — SODIUM CHLORIDE 0.9 % IV SOLN
1000.0000 mL | INTRAVENOUS | Status: DC
  Administered 2017-08-15: 1000 mL via INTRAVENOUS

## 2017-08-15 MED ORDER — CEPHALEXIN 500 MG PO CAPS: 500.0000 mg | ORAL_CAPSULE | Freq: Three times a day (TID) | ORAL | 0 refills | Status: DC

## 2017-08-15 NOTE — ED Provider Notes (Signed)
[facility] EMERGENCY DEPARTMENT Provider Note   CSN: [id number] Arrival date & time: 08/14/17  2312     History   Chief Complaint Chief Complaint  Patient presents with  . Urinary Retention    HPI Luke Foster is a 67 y.o. male.  Patient with history of chronic back pain, leg weakness, on chronic narcotics presenting with difficulty with urination for the past 12 hours.  Initially told the nurse was greater than 24 hours since he urinated.  His wife states it was at least 12 hours ago since he urinated.  Foley catheter was placed in triage and he is feeling much better.  States he has required a Foley in the past for prostate issues but not for more than 10 years.  His abdominal pain has resolved.  No testicular pain.  No fever or vomiting.  Patient did have some cramps in his back after spending the day outside yesterday but these have resolved.  These are typical of his usual back pain and cramps that have since resolved.  He denies any new weakness, numbness, tingling.  No bowel incontinence or bladder incontinence.  The history is provided by the patient.    Past Medical History:  Diagnosis Date  . Chronic back pain   . Chronic pain 1999   Bilateral feet (R >L)  . Difficult or painful urination   . High blood pressure   . Nerve damage   . Pain management   . Paresthesia of foot, bilateral   . Weakness of both legs     Patient Active Problem List   Diagnosis Date Noted  . Neuropathy 01/21/2017  . Pain in right foot 01/21/2017  . Pain in left foot 01/21/2017  . Chronic pain syndrome 01/21/2017  . Post laminectomy syndrome 01/21/2017  . Status post insertion of spinal cord stimulator 01/21/2017  . Uncomplicated opioid dependence ([place]) 01/21/2017  . Lumbosacral radiculopathy 12/09/2012    Past Surgical History:  Procedure Laterality Date  . arm surgery    . BACK SURGERY    . FACIAL COSMETIC SURGERY    . HERNIA REPAIR    . KIDNEY STONE SURGERY     . MASTECTOMY Left         Home Medications    Prior to Admission medications   Medication Sig Start Date End Date Taking? Authorizing Provider  diltiazem (TIAZAC) 120 MG 24 hr capsule take 1 capsule BY MOUTH EVERY DAY add to daily enalapril 01/07/17   [provider]  DULoxetine (CYMBALTA) 60 MG capsule Take 60 mg by mouth daily.    [provider]  enalapril (VASOTEC) 5 MG tablet Take 5 mg by mouth daily.     [provider]  enalapril (VASOTEC) 5 MG tablet Take by mouth. 05/20/16   [provider]  gabapentin (NEURONTIN) 100 MG capsule Take 100 mg by mouth 3 (three) times daily. 01/04/17   [provider]  ibuprofen (ADVIL,MOTRIN) 200 MG tablet Take 800 mg by mouth every 8 (eight) hours as needed for moderate pain.    [provider]  lidocaine (LIDODERM) 5 % Place 1 patch onto the skin daily. Remove & Discard patch within 12 hours or as directed by MD    [provider]  morphine (MS CONTIN) 30 MG 12 hr tablet Take 30 mg by mouth every 12 (twelve) hours.    [provider]  oxyCODONE-acetaminophen (PERCOCET) 10-325 MG per tablet Take 1 tablet by mouth daily as needed  for pain.  11/24/12   [provider]  tamsulosin (FLOMAX) 0.4 MG CAPS capsule Take 0.4 mg by mouth daily.    [provider]  traMADol (ULTRAM) 50 MG tablet Take 50 mg by mouth 4 (four) times daily. 01/04/17   [provider]  venlafaxine XR (EFFEXOR-XR) 37.5 MG 24 hr capsule Take 37.5 mg by mouth 2 (two) times daily. 10/28/16   [provider]    Family History Family History  Problem Relation Age of Onset  . Heart disease Father        Living, 23  . Breast cancer Mother        Living, 55  . Hypercholesterolemia Mother   . Healthy Brother   . Healthy Sister     Social History Social History   Tobacco Use  . Smoking status: Former Smoker    Packs/day: 2.00    Years: 10.00    Pack years: 20.00     Types: Cigarettes  . Smokeless tobacco: Former Systems developer    Quit date: 06/08/1991  Substance Use Topics  . Alcohol use: No  . Drug use: No     Allergies   Erythromycin   Review of Systems Review of Systems  Constitutional: Negative for activity change, appetite change and fever.  HENT: Negative for congestion.   Respiratory: Negative for cough, chest tightness and shortness of breath.   Cardiovascular: Negative for chest pain and leg swelling.  Gastrointestinal: Negative for abdominal pain.  Genitourinary: Positive for decreased urine volume and difficulty urinating. Negative for discharge, flank pain, hematuria, penile swelling and testicular pain.  Musculoskeletal: Positive for arthralgias, back pain and myalgias.  Skin: Negative for rash.  Neurological: Negative for dizziness, weakness and light-headedness.   all other systems are negative except as noted in the HPI and PMH.     Physical Exam Updated Vital Signs BP 104/73   Pulse (!) 143   Temp 99.2 F (37.3 C)   Resp 18   SpO2 99%   Physical Exam  Constitutional: He is oriented to person, place, and time. He appears well-developed and well-nourished. No distress.  HENT:  Head: Normocephalic and atraumatic.  Mouth/Throat: Oropharynx is clear and moist. No oropharyngeal exudate.  Eyes: Pupils are equal, round, and reactive to light. Conjunctivae and EOM are normal.  Neck: Normal range of motion. Neck supple.  No meningismus.  Cardiovascular: Normal rate, regular rhythm, normal heart sounds and intact distal pulses.  No murmur heard. Pulmonary/Chest: Effort normal and breath sounds normal. No respiratory distress.  Abdominal: Soft. There is no tenderness. There is no rebound and no guarding.  Abdomen is soft after Foley placed  Genitourinary:  Genitourinary Comments: Circumcised, Foley catheter in place draining dark yellow urine.  No testicular tenderness  Musculoskeletal: Normal range of motion. He exhibits no edema  or tenderness.  Neurological: He is alert and oriented to person, place, and time. No cranial nerve deficit. He exhibits normal muscle tone. Coordination normal.  Decreased flexion extension of bilateral ankles and knees which is chronic per patient. Intact DP and PT pulses. Lower extremity weakness at baseline  Skin: Skin is warm.  Psychiatric: He has a normal mood and affect. His behavior is normal.  Nursing note and vitals reviewed.    ED Treatments / Results  Labs (all labs ordered are listed, but only abnormal results are displayed) Labs Reviewed  URINALYSIS, ROUTINE W REFLEX MICROSCOPIC - Abnormal; Notable for the following components:      Result Value  Color, Urine RED (*)    APPearance TURBID (*)    Hgb urine dipstick LARGE (*)    Bilirubin Urine SMALL (*)    Ketones, ur 15 (*)    Protein, ur >300 (*)    Nitrite POSITIVE (*)    All other components within normal limits  BASIC METABOLIC PANEL - Abnormal; Notable for the following components:   CO2 20 (*)    Glucose, Bld 100 (*)    Creatinine, Ser 1.53 (*)    GFR calc non Af Amer 46 (*)    GFR calc Af Amer 53 (*)    All other components within normal limits  URINALYSIS, MICROSCOPIC (REFLEX) - Abnormal; Notable for the following components:   Bacteria, UA RARE (*)    All other components within normal limits  URINE CULTURE  CBC  MAGNESIUM  CK    EKG None  Radiology No results found.  Procedures Procedures (including critical care time)  Medications Ordered in ED Medications  sodium chloride 0.9 % bolus 1,000 mL (has no administration in time range)    Followed by  0.9 %  sodium chloride infusion (has no administration in time range)  oxyCODONE-acetaminophen (PERCOCET/ROXICET) 5-325 MG per tablet 2 tablet (has no administration in time range)     Initial Impression / Assessment and Plan / ED Course  I have reviewed the triage vital signs and the nursing notes.  Pertinent labs & imaging results that  were available during my care of the patient were reviewed by me and considered in my medical decision making (see chart for details).    Patient with history of chronic pain presenting with urinary retention.  Now resolved after Foley catheter placement.  Feels much better. He has chronic weakness in his legs which is unchanged.  HR and BP improved. Abdominal pain resolved. Back pain at baseline.   IVF given. Elevated Cr likely post obstructive.  Hematuria with +nitrite. Rocephin given and culture sent.  CT without evidence of ureteral stone.  Will treat UTI, keep foley in place until urology followup.advised to have Cr rechecked next week. Return precautions discussed.  BP 103/90 (BP Location: Right Arm)   Pulse 95   Temp 98.5 F (36.9 C) (Oral)   Resp 18   SpO2 95%   Final Clinical Impressions(s) / ED Diagnoses   Final diagnoses:  Urinary retention  Urinary tract infection with hematuria, site unspecified    ED Discharge Orders    None       Jamacia Jester, Annie Main, MD 08/15/17 [phone]

## 2017-08-15 NOTE — Discharge Instructions (Addendum)
Keep the catheter in place until follow-up with the urologist. Have your kidney function rechecked next week.  Take your medications as prescribed.  Return to the ED if you have fever, nausea, vomiting or other concerns.

## 2017-08-15 NOTE — ED Notes (Signed)
Foley catheter tubing was clamped off after approximately 959mls of urine were collected.

## 2017-08-16 LAB — URINE CULTURE: Culture: NO GROWTH

## 2017-08-23 DIAGNOSIS — R338 Other retention of urine: Secondary | ICD-10-CM | POA: Diagnosis not present

## 2017-08-24 DIAGNOSIS — R338 Other retention of urine: Secondary | ICD-10-CM | POA: Diagnosis not present

## 2017-09-01 DIAGNOSIS — N401 Enlarged prostate with lower urinary tract symptoms: Secondary | ICD-10-CM | POA: Diagnosis not present

## 2017-09-01 DIAGNOSIS — N281 Cyst of kidney, acquired: Secondary | ICD-10-CM | POA: Diagnosis not present

## 2017-09-01 DIAGNOSIS — R338 Other retention of urine: Secondary | ICD-10-CM | POA: Diagnosis not present

## 2017-10-06 DIAGNOSIS — H1851 Endothelial corneal dystrophy: Secondary | ICD-10-CM | POA: Diagnosis not present

## 2017-10-06 DIAGNOSIS — H04123 Dry eye syndrome of bilateral lacrimal glands: Secondary | ICD-10-CM | POA: Diagnosis not present

## 2017-10-06 DIAGNOSIS — H35372 Puckering of macula, left eye: Secondary | ICD-10-CM | POA: Diagnosis not present

## 2017-10-06 DIAGNOSIS — Z961 Presence of intraocular lens: Secondary | ICD-10-CM | POA: Diagnosis not present

## 2017-10-12 ENCOUNTER — Other Ambulatory Visit: Payer: Self-pay | Admitting: Urology

## 2017-10-12 DIAGNOSIS — N281 Cyst of kidney, acquired: Secondary | ICD-10-CM

## 2017-10-18 ENCOUNTER — Other Ambulatory Visit: Payer: Self-pay | Admitting: Physician Assistant

## 2017-10-19 ENCOUNTER — Encounter (HOSPITAL_COMMUNITY): Payer: Self-pay

## 2017-10-19 ENCOUNTER — Ambulatory Visit (HOSPITAL_COMMUNITY)
Admission: RE | Admit: 2017-10-19 | Discharge: 2017-10-19 | Disposition: A | Discharge status: Home / Self Care | Payer: Medicare Other | Source: Ambulatory Visit | Attending: Urology | Admitting: Urology

## 2017-10-19 ENCOUNTER — Other Ambulatory Visit: Payer: Self-pay | Admitting: Urology

## 2017-10-19 ENCOUNTER — Other Ambulatory Visit: Payer: Self-pay

## 2017-10-19 DIAGNOSIS — M549 Dorsalgia, unspecified: Secondary | ICD-10-CM | POA: Diagnosis not present

## 2017-10-19 DIAGNOSIS — Z87891 Personal history of nicotine dependence: Secondary | ICD-10-CM | POA: Insufficient documentation

## 2017-10-19 DIAGNOSIS — Z79891 Long term (current) use of opiate analgesic: Secondary | ICD-10-CM | POA: Insufficient documentation

## 2017-10-19 DIAGNOSIS — K5909 Other constipation: Secondary | ICD-10-CM | POA: Insufficient documentation

## 2017-10-19 DIAGNOSIS — N281 Cyst of kidney, acquired: Secondary | ICD-10-CM

## 2017-10-19 DIAGNOSIS — G8929 Other chronic pain: Secondary | ICD-10-CM | POA: Diagnosis not present

## 2017-10-19 HISTORY — PX: IR US GUIDE BX ASP/DRAIN: IMG2392

## 2017-10-19 LAB — CBC
HCT: 45.5 % (ref 39.0–52.0)
Hemoglobin: 14.8 g/dL (ref 13.0–17.0)
MCH: 29.5 pg (ref 26.0–34.0)
MCHC: 32.5 g/dL (ref 30.0–36.0)
MCV: 90.6 fL (ref 78.0–100.0)
Platelets: 258 10*3/uL (ref 150–400)
RBC: 5.02 MIL/uL (ref 4.22–5.81)
RDW: 12.6 % (ref 11.5–15.5)
WBC: 5.6 10*3/uL (ref 4.0–10.5)

## 2017-10-19 LAB — PROTIME-INR
INR: 0.98
Prothrombin Time: 12.9 seconds (ref 11.4–15.2)

## 2017-10-19 LAB — APTT: aPTT: 25 seconds (ref 24–36)

## 2017-10-19 MED ORDER — SODIUM CHLORIDE 0.9 % IV SOLN
INTRAVENOUS | Status: DC
  Administered 2017-10-19: 11:00:00 via INTRAVENOUS

## 2017-10-19 MED ORDER — LIDOCAINE HCL 1 % IJ SOLN
INTRAMUSCULAR | Status: AC
  Filled 2017-10-19: qty 20

## 2017-10-19 MED ORDER — FLUMAZENIL 0.5 MG/5ML IV SOLN
INTRAVENOUS | Status: AC
  Filled 2017-10-19: qty 5

## 2017-10-19 MED ORDER — MIDAZOLAM HCL 2 MG/2ML IJ SOLN
INTRAMUSCULAR | Status: AC | PRN
  Administered 2017-10-19 (×2): 1 mg via INTRAVENOUS

## 2017-10-19 MED ORDER — FENTANYL CITRATE (PF) 100 MCG/2ML IJ SOLN
INTRAMUSCULAR | Status: AC | PRN
  Administered 2017-10-19 (×2): 50 ug via INTRAVENOUS

## 2017-10-19 MED ORDER — NALOXONE HCL 0.4 MG/ML IJ SOLN
INTRAMUSCULAR | Status: AC
  Filled 2017-10-19: qty 1

## 2017-10-19 MED ORDER — FENTANYL CITRATE (PF) 100 MCG/2ML IJ SOLN
INTRAMUSCULAR | Status: AC
  Filled 2017-10-19: qty 4

## 2017-10-19 MED ORDER — MIDAZOLAM HCL 2 MG/2ML IJ SOLN
INTRAMUSCULAR | Status: AC
  Filled 2017-10-19: qty 4

## 2017-10-19 NOTE — Discharge Instructions (Signed)
Moderate Conscious Sedation, Adult, Care After °These instructions provide you with information about caring for yourself after your procedure. Your health care provider may also give you more specific instructions. Your treatment has been planned according to current medical practices, but problems sometimes occur. Call your health care provider if you have any problems or questions after your procedure. °What can I expect after the procedure? °After your procedure, it is common: °· To feel sleepy for several hours. °· To feel clumsy and have poor balance for several hours. °· To have poor judgment for several hours. °· To vomit if you eat too soon. ° °Follow these instructions at home: °For at least 24 hours after the procedure: ° °· Do not: °? Participate in activities where you could fall or become injured. °? Drive. °? Use heavy machinery. °? Drink alcohol. °? Take sleeping pills or medicines that cause drowsiness. °? Make important decisions or sign legal documents. °? Take care of children on your own. °· Rest. °Eating and drinking °· Follow the diet recommended by your health care provider. °· If you vomit: °? Drink water, juice, or soup when you can drink without vomiting. °? Make sure you have little or no nausea before eating solid foods. °General instructions °· Have a responsible adult stay with you until you are awake and alert. °· Take over-the-counter and prescription medicines only as told by your health care provider. °· If you smoke, do not smoke without supervision. °· Keep all follow-up visits as told by your health care provider. This is important. °Contact a health care provider if: °· You keep feeling nauseous or you keep vomiting. °· You feel light-headed. °· You develop a rash. °· You have a fever. °Get help right away if: °· You have trouble breathing. °This information is not intended to replace advice given to you by your health care provider. Make sure you discuss any questions you have  with your health care provider. °Document Released: 11/16/2012 Document Revised: 07/01/2015 Document Reviewed: 05/18/2015 °Elsevier Interactive Patient Education © 2018 [employer] ° ° °Needle Biopsy, Care After °These instructions give you information about caring for yourself after your procedure. Your doctor may also give you more specific instructions. Call your doctor if you have any problems or questions after your procedure. °Follow these instructions at home: °· Rest as told by your doctor. °· Take medicines only as told by your doctor. °· There are many different ways to close and cover the biopsy site, including stitches (sutures), skin glue, and adhesive strips. Follow instructions from your doctor about: °? How to take care of your biopsy site. °? When and how you should change your bandage (dressing). °? When you should remove your dressing. °? Removing whatever was used to close your biopsy site. °· Check your biopsy site every day for signs of infection. Watch for: °? Redness, swelling, or pain. °? Fluid, blood, or pus. °Contact a doctor if: °· You have a fever. °· You have redness, swelling, or pain at the biopsy site, and it lasts longer than a few days. °· You have fluid, blood, or pus coming from the biopsy site. °· You feel sick to your stomach (nauseous). °· You throw up (vomit). °Get help right away if: °· You are short of breath. °· You have trouble breathing. °· Your chest hurts. °· You feel dizzy or you pass out (faint). °· You have bleeding that does not stop with pressure or a bandage. °· You cough up blood. °·   Your belly (abdomen) hurts. °This information is not intended to replace advice given to you by your health care provider. Make sure you discuss any questions you have with your health care provider. °Document Released: 01/09/2008 Document Revised: 07/04/2015 Document Reviewed: 01/22/2014 °Elsevier Interactive Patient Education © 2018 [employer] ° °

## 2017-10-19 NOTE — Procedures (Signed)
Interventional Radiology Procedure Note  Procedure: US guided aspiration of right renal cyst  Complications: None  Estimated Blood Loss: None  Findings: Right renal cyst aspirated via 5 Fr Yueh centesis catheter.  400 mL of clear, yellowish fluid removed with complete decompression of cyst by [place].  Fluid sent for cytology.  Venetia Night. Kathlene Cote, M.D Pager:  [phone]

## 2017-10-19 NOTE — H&P (Signed)
Chief Complaint: Patient was seen in consultation today for image guided aspiration and drainage of right renal cyst  Referring Physician(s): Bell,Eugene D III  Supervising Physician: Aletta Edouard  Patient Status: [facility] - Out-pt  History of Present Illness: Luke Foster is a 67 y.o. male with a past medical history of nerve damage, chronic back pain, lower extremity weakness and renal cyst. Patient was seen in ED 08/15/17 with complaints of urinary retention >12 hours as well as back pain/cramping, foley catheter was placed which resolved symptoms at that time. CT renal stone study was obtained which showed bilateral renal cysts. Patient states he had previously known about the renal cysts but states he was told that the right cyst was "bigger than my kidney" and would need to be drained which is what he presents for today.  Patient states he has "catastrophic" nerve damage and back pain requiring chronic narcotic medications so he is "used to having pain all the time" however he states he has increased back pain on his right side as well as increased lower extremity weakness which he is concerned may be due to the renal cyst pressing on his spine. He reports chronic constipation due to narcotic medication - last BM this morning. Last PO intake 8 pm yesterday, does not take any blood thinners, took gabapentin, effexor and percocet this morning with a few sips of water.  Past Medical History:  Diagnosis Date  . Chronic back pain   . Chronic pain 1999   Bilateral feet (R >L)  . Difficult or painful urination   . High blood pressure   . Nerve damage   . Pain management   . Paresthesia of foot, bilateral   . Weakness of both legs     Past Surgical History:  Procedure Laterality Date  . arm surgery    . BACK SURGERY    . FACIAL COSMETIC SURGERY    . HERNIA REPAIR    . KIDNEY STONE SURGERY    . MASTECTOMY Left     Allergies: Erythromycin  Medications: Prior to Admission  medications   Medication Sig Start Date End Date Taking? Authorizing Provider  cephALEXin (KEFLEX) 500 MG capsule Take 1 capsule (500 mg total) by mouth 3 (three) times daily. 08/15/17   Rancour, Annie Main, MD  diltiazem (TIAZAC) 120 MG 24 hr capsule take 1 capsule BY MOUTH EVERY DAY add to daily enalapril 01/07/17   [provider]  DULoxetine (CYMBALTA) 60 MG capsule Take 60 mg by mouth daily.    [provider]  enalapril (VASOTEC) 5 MG tablet Take 5 mg by mouth daily.     [provider]  enalapril (VASOTEC) 5 MG tablet Take by mouth. 05/20/16   [provider]  gabapentin (NEURONTIN) 100 MG capsule Take 100 mg by mouth 3 (three) times daily. 01/04/17   [provider]  ibuprofen (ADVIL,MOTRIN) 200 MG tablet Take 800 mg by mouth every 8 (eight) hours as needed for moderate pain.    [provider]  lidocaine (LIDODERM) 5 % Place 1 patch onto the skin daily. Remove & Discard patch within 12 hours or as directed by MD    [provider]  morphine (MS CONTIN) 30 MG 12 hr tablet Take 30 mg by mouth every 12 (twelve) hours.    [provider]  oxyCODONE-acetaminophen (PERCOCET) 10-325 MG per tablet Take 1 tablet by mouth daily as needed for pain.  11/24/12   [provider]  tamsulosin (FLOMAX) 0.4  MG CAPS capsule Take 0.4 mg by mouth daily.    [provider]  traMADol (ULTRAM) 50 MG tablet Take 50 mg by mouth 4 (four) times daily. 01/04/17   [provider]  venlafaxine XR (EFFEXOR-XR) 37.5 MG 24 hr capsule Take 37.5 mg by mouth 2 (two) times daily. 10/28/16   [provider]     Family History  Problem Relation Age of Onset  . Heart disease Father        Living, 51  . Breast cancer Mother        Living, 76  . Hypercholesterolemia Mother   . Healthy Brother   . Healthy Sister     Social History   Socioeconomic History  . Marital status: Married    Spouse name: Not on file  . Number  of children: Not on file  . Years of education: Not on file  . Highest education level: Not on file  Occupational History  . Not on file  Social Needs  . Financial resource strain: Not on file  . Food insecurity:    Worry: Not on file    Inability: Not on file  . Transportation needs:    Medical: Not on file    Non-medical: Not on file  Tobacco Use  . Smoking status: Former Smoker    Packs/day: 2.00    Years: 10.00    Pack years: 20.00    Types: Cigarettes  . Smokeless tobacco: Former Systems developer    Quit date: 06/08/1991  Substance and Sexual Activity  . Alcohol use: No  . Drug use: No  . Sexual activity: Not on file  Lifestyle  . Physical activity:    Days per week: Not on file    Minutes per session: Not on file  . Stress: Not on file  Relationships  . Social connections:    Talks on phone: Not on file    Gets together: Not on file    Attends religious service: Not on file    Active member of club or organization: Not on file    Attends meetings of clubs or organizations: Not on file    Relationship status: Not on file  Other Topics Concern  . Not on file  Social History Narrative   Lives with wife.   He is on disability since 2011 due to chronic back pain.     Review of Systems: A 12 point ROS discussed and pertinent positives are indicated in the HPI above.  All other systems are negative.  Review of Systems  Constitutional: Negative for appetite change, chills and fever.  Respiratory: Negative for cough and shortness of breath.   Cardiovascular: Negative for chest pain.  Gastrointestinal: Positive for constipation. Negative for abdominal pain, diarrhea, nausea and vomiting.  Genitourinary: Negative for difficulty urinating, dysuria, flank pain and hematuria.  Musculoskeletal: Positive for back pain (chronic).  Skin: Negative for rash.  Neurological: Positive for numbness (all extremities; chronic). Negative for dizziness.  Psychiatric/Behavioral: Negative for  confusion.    Vital Signs: There were no vitals taken for this visit.  Physical Exam  Constitutional: He is oriented to person, place, and time. No distress.  HENT:  Head: Normocephalic.  Cardiovascular: Normal rate, regular rhythm and normal heart sounds.  Pulmonary/Chest: Effort normal and breath sounds normal.  Abdominal: Soft. Bowel sounds are normal. He exhibits no distension. There is no tenderness.  Neurological: He is alert and oriented to person, place, and time.  Skin: Skin is warm and dry.  No rash noted. He is not diaphoretic.  Psychiatric: He has a normal mood and affect. His behavior is normal. Judgment and thought content normal.  Vitals reviewed.    MD Evaluation Airway: WNL Heart: WNL Abdomen: WNL Chest/ Lungs: WNL ASA  Classification: 2 Mallampati/Airway Score: Two   Imaging: No results found.  Labs:  CBC: Recent Labs    08/14/17 2345 10/19/17 1031  WBC 6.5 5.6  HGB 15.5 14.8  HCT 48.8 45.5  PLT 227 258    COAGS: Recent Labs    10/19/17 1031  INR 0.98  APTT 25    BMP: Recent Labs    08/14/17 2345  NA 135  K 4.0  CL 103  CO2 20*  GLUCOSE 100*  BUN 14  CALCIUM 9.2  CREATININE 1.53*  GFRNONAA 46*  GFRAA 53*    LIVER FUNCTION TESTS: No results for input(s): BILITOT, AST, ALT, ALKPHOS, PROT, ALBUMIN in the last 8760 hours.  TUMOR MARKERS: No results for input(s): AFPTM, CEA, CA199, CHROMGRNA in the last 8760 hours.  Assessment and Plan:  Right renal cyst - previously known per patient however it appears that it has enlarged recently and requires aspiration. He reports history of urinary retention and prostate issues requiring foley catheter insertion as recently as 08/2017 however he has not had any urinary retention, hematuria or dysuria since July. He is followed by Dr. Gloriann Loan who requests aspiration and drainage today. Last PO intake 8 pm yesterday, afebrile, WBC 5.6, INR 0.98, patient is agreeable to proceed.  Risks and  benefits discussed with the patient and wife including bleeding, infection, damage to adjacent structures, bowel perforation/fistula connection, and sepsis.  All of the patient and wife's questions were answered, patient and wife are agreeable to proceed.  Consent signed and in chart.  Thank you for this interesting consult.  I greatly enjoyed meeting Luke Foster and look forward to participating in their care.  A copy of this report was sent to the requesting provider on this date.  Electronically Signed: Joaquim Nam, PA-C 10/19/2017, 11:17 AM   I spent a total of  15 Minutes in face to face in clinical consultation, greater than 50% of which was counseling/coordinating care for right renal cyst aspiration and drainage.

## 2017-11-15 DIAGNOSIS — G894 Chronic pain syndrome: Secondary | ICD-10-CM | POA: Diagnosis not present

## 2017-11-15 DIAGNOSIS — Z1389 Encounter for screening for other disorder: Secondary | ICD-10-CM | POA: Diagnosis not present

## 2017-11-15 DIAGNOSIS — L989 Disorder of the skin and subcutaneous tissue, unspecified: Secondary | ICD-10-CM | POA: Diagnosis not present

## 2017-12-29 DIAGNOSIS — C4441 Basal cell carcinoma of skin of scalp and neck: Secondary | ICD-10-CM | POA: Diagnosis not present

## 2018-01-12 DIAGNOSIS — I10 Essential (primary) hypertension: Secondary | ICD-10-CM | POA: Diagnosis not present

## 2018-01-12 DIAGNOSIS — G894 Chronic pain syndrome: Secondary | ICD-10-CM | POA: Diagnosis not present

## 2018-02-17 DIAGNOSIS — Z85828 Personal history of other malignant neoplasm of skin: Secondary | ICD-10-CM | POA: Diagnosis not present

## 2018-02-17 DIAGNOSIS — Z08 Encounter for follow-up examination after completed treatment for malignant neoplasm: Secondary | ICD-10-CM | POA: Diagnosis not present

## 2018-02-24 DIAGNOSIS — G894 Chronic pain syndrome: Secondary | ICD-10-CM | POA: Diagnosis not present

## 2018-02-24 DIAGNOSIS — Z0001 Encounter for general adult medical examination with abnormal findings: Secondary | ICD-10-CM | POA: Diagnosis not present

## 2018-02-24 DIAGNOSIS — Z1389 Encounter for screening for other disorder: Secondary | ICD-10-CM | POA: Diagnosis not present

## 2018-04-28 DIAGNOSIS — E559 Vitamin D deficiency, unspecified: Secondary | ICD-10-CM | POA: Diagnosis not present

## 2018-04-28 DIAGNOSIS — M810 Age-related osteoporosis without current pathological fracture: Secondary | ICD-10-CM | POA: Diagnosis not present

## 2018-04-28 DIAGNOSIS — R5383 Other fatigue: Secondary | ICD-10-CM | POA: Diagnosis not present

## 2018-05-02 DIAGNOSIS — M5136 Other intervertebral disc degeneration, lumbar region: Secondary | ICD-10-CM | POA: Diagnosis not present

## 2018-05-02 DIAGNOSIS — G894 Chronic pain syndrome: Secondary | ICD-10-CM | POA: Diagnosis not present

## 2018-05-02 DIAGNOSIS — Z1389 Encounter for screening for other disorder: Secondary | ICD-10-CM | POA: Diagnosis not present

## 2018-05-02 DIAGNOSIS — C4491 Basal cell carcinoma of skin, unspecified: Secondary | ICD-10-CM | POA: Diagnosis not present

## 2018-05-18 DIAGNOSIS — E559 Vitamin D deficiency, unspecified: Secondary | ICD-10-CM | POA: Diagnosis not present

## 2018-05-18 DIAGNOSIS — M810 Age-related osteoporosis without current pathological fracture: Secondary | ICD-10-CM | POA: Diagnosis not present

## 2018-06-02 DIAGNOSIS — G894 Chronic pain syndrome: Secondary | ICD-10-CM | POA: Diagnosis not present

## 2018-06-02 DIAGNOSIS — I10 Essential (primary) hypertension: Secondary | ICD-10-CM | POA: Diagnosis not present

## 2018-06-02 DIAGNOSIS — M5136 Other intervertebral disc degeneration, lumbar region: Secondary | ICD-10-CM | POA: Diagnosis not present

## 2018-06-30 DIAGNOSIS — H00014 Hordeolum externum left upper eyelid: Secondary | ICD-10-CM | POA: Diagnosis not present

## 2018-06-30 DIAGNOSIS — Z23 Encounter for immunization: Secondary | ICD-10-CM | POA: Diagnosis not present

## 2018-06-30 DIAGNOSIS — Z1389 Encounter for screening for other disorder: Secondary | ICD-10-CM | POA: Diagnosis not present

## 2018-08-01 DIAGNOSIS — G894 Chronic pain syndrome: Secondary | ICD-10-CM | POA: Diagnosis not present

## 2018-08-03 DIAGNOSIS — S70362A Insect bite (nonvenomous), left thigh, initial encounter: Secondary | ICD-10-CM | POA: Diagnosis not present

## 2018-08-03 DIAGNOSIS — T07XXXA Unspecified multiple injuries, initial encounter: Secondary | ICD-10-CM | POA: Diagnosis not present

## 2018-08-03 DIAGNOSIS — Z1389 Encounter for screening for other disorder: Secondary | ICD-10-CM | POA: Diagnosis not present

## 2018-08-03 DIAGNOSIS — W57XXXA Bitten or stung by nonvenomous insect and other nonvenomous arthropods, initial encounter: Secondary | ICD-10-CM | POA: Diagnosis not present

## 2018-08-30 DIAGNOSIS — G894 Chronic pain syndrome: Secondary | ICD-10-CM | POA: Diagnosis not present

## 2018-09-29 DIAGNOSIS — G894 Chronic pain syndrome: Secondary | ICD-10-CM | POA: Diagnosis not present

## 2018-09-29 DIAGNOSIS — M5136 Other intervertebral disc degeneration, lumbar region: Secondary | ICD-10-CM | POA: Diagnosis not present

## 2018-10-19 ENCOUNTER — Other Ambulatory Visit: Payer: Self-pay | Admitting: Sports Medicine

## 2018-10-19 DIAGNOSIS — M545 Low back pain, unspecified: Secondary | ICD-10-CM

## 2018-10-19 DIAGNOSIS — M47816 Spondylosis without myelopathy or radiculopathy, lumbar region: Secondary | ICD-10-CM | POA: Diagnosis not present

## 2018-10-19 DIAGNOSIS — M1611 Unilateral primary osteoarthritis, right hip: Secondary | ICD-10-CM | POA: Diagnosis not present

## 2018-10-24 ENCOUNTER — Ambulatory Visit
Admission: RE | Admit: 2018-10-24 | Discharge: 2018-10-24 | Disposition: A | Discharge status: Home / Self Care | Payer: Medicare Other | Source: Ambulatory Visit | Attending: Sports Medicine | Admitting: Sports Medicine

## 2018-10-24 DIAGNOSIS — M545 Low back pain, unspecified: Secondary | ICD-10-CM

## 2018-10-26 ENCOUNTER — Other Ambulatory Visit: Payer: Self-pay

## 2018-10-26 ENCOUNTER — Encounter: Payer: Self-pay | Admitting: Neurology

## 2018-10-26 DIAGNOSIS — Z981 Arthrodesis status: Secondary | ICD-10-CM

## 2018-10-28 DIAGNOSIS — M1611 Unilateral primary osteoarthritis, right hip: Secondary | ICD-10-CM | POA: Diagnosis not present

## 2018-11-02 DIAGNOSIS — G894 Chronic pain syndrome: Secondary | ICD-10-CM | POA: Diagnosis not present

## 2018-11-30 ENCOUNTER — Ambulatory Visit (INDEPENDENT_AMBULATORY_CARE_PROVIDER_SITE_OTHER): Payer: Medicare Other | Admitting: Neurology

## 2018-11-30 ENCOUNTER — Other Ambulatory Visit: Payer: Self-pay

## 2018-11-30 ENCOUNTER — Encounter: Payer: Self-pay | Admitting: Neurology

## 2018-11-30 DIAGNOSIS — Z981 Arthrodesis status: Secondary | ICD-10-CM

## 2018-11-30 NOTE — Progress Notes (Signed)
Patient unable to do video visit, will reschedule appointment to in-office.

## 2018-12-01 ENCOUNTER — Encounter: Payer: Medicare Other | Admitting: Neurology

## 2018-12-01 DIAGNOSIS — G894 Chronic pain syndrome: Secondary | ICD-10-CM | POA: Diagnosis not present

## 2018-12-01 DIAGNOSIS — M5136 Other intervertebral disc degeneration, lumbar region: Secondary | ICD-10-CM | POA: Diagnosis not present

## 2018-12-02 ENCOUNTER — Ambulatory Visit: Payer: Medicare Other | Admitting: Neurology

## 2018-12-02 ENCOUNTER — Encounter: Payer: Self-pay | Admitting: Neurology

## 2018-12-02 ENCOUNTER — Other Ambulatory Visit: Payer: Self-pay

## 2018-12-02 VITALS — BP 144/80 | HR 75 | Ht 70.0 in | Wt 199.0 lb

## 2018-12-02 DIAGNOSIS — Z981 Arthrodesis status: Secondary | ICD-10-CM

## 2018-12-02 DIAGNOSIS — M5417 Radiculopathy, lumbosacral region: Secondary | ICD-10-CM

## 2018-12-02 DIAGNOSIS — R29898 Other symptoms and signs involving the musculoskeletal system: Secondary | ICD-10-CM | POA: Diagnosis not present

## 2018-12-02 DIAGNOSIS — G629 Polyneuropathy, unspecified: Secondary | ICD-10-CM

## 2018-12-02 NOTE — Patient Instructions (Addendum)
We will order nerve testing of the legs and based on these results, decide the next step.   ELECTROMYOGRAM AND NERVE CONDUCTION STUDIES (EMG/NCS) INSTRUCTIONS  How to Prepare The neurologist conducting the EMG will need to know if you have certain medical conditions. Tell the neurologist and other EMG lab personnel if you: . Have a pacemaker or any other electrical medical device . Take blood-thinning medications . Have hemophilia, a blood-clotting disorder that causes prolonged bleeding Bathing Take a shower or bath shortly before your exam in order to remove oils from your skin. Don't apply lotions or creams before the exam.  What to Expect You'll likely be asked to change into a hospital gown for the procedure and lie down on an examination table. The following explanations can help you understand what will happen during the exam.  . Electrodes. The neurologist or a technician places surface electrodes at various locations on your skin depending on where you're experiencing symptoms. Or the neurologist may insert needle electrodes at different sites depending on your symptoms.  . Sensations. The electrodes will at times transmit a tiny electrical current that you may feel as a twinge or spasm. The needle electrode may cause discomfort or pain that usually ends shortly after the needle is removed. If you are concerned about discomfort or pain, you may want to talk to the neurologist about taking a short break during the exam.  . Instructions. During the needle EMG, the neurologist will assess whether there is any spontaneous electrical activity when the muscle is at rest - activity that isn't present in healthy muscle tissue - and the degree of activity when you slightly contract the muscle.  He or she will give you instructions on resting and contracting a muscle at appropriate times. Depending on what muscles and nerves the neurologist is examining, he or she may ask you to change positions  during the exam.  After your EMG You may experience some temporary, minor bruising where the needle electrode was inserted into your muscle. This bruising should fade within several days. If it persists, contact your primary care doctor.

## 2018-12-02 NOTE — Progress Notes (Signed)
[facility] Clinic Note - Initial Visit   Date: 12/02/2018  Luke Foster MRN: [id number] DOB: [birth year]   Dear Dr. Layne Benton:  Thank you for your kind referral of Luke Foster for consultation of right leg weakness. Although his history is well known to you, please allow [place] to reiterate it for the purpose of our medical record. The patient was accompanied to the clinic by self.   History of Present Illness: Luke Foster is a 68 y.o. right-handed male with hypertension, chronic pain feet back nerve injury s/p lumbar fusion pain stimulator, peripheral neuropathy, and depression presenting for evaluation of bilateral hip flexion weakness, worse on the right.    His bilateral leg weakness has been ongoing for the past 10 years.  He reports having painless weakness which has been slow and progressive involving the right > left hip flexors.  He has difficulty raising the right leg, such as climb stairs.  He is able to walk with a walker/cane is a wheelchair for long distances.  He falls often, but has not sustained any severe injuries with any of his falls.  He lives alone in a 1 level home.  He was previously evaluated by me in 2014 for the same complaints at which time EMG showed chronic right L3-5 and left L5 radiculopathy, unchanged from prior EMG dated 2013.  There is no evidence of myopathy or fibrillation potentials.  He is followed by pain management and has a spinal cord stimulator.  Prior back surgery includes decompressive laminectomy at L4-5 micro dissection of the right L4 and L5 nerve roots and foraminotomies at this level.  He had redo laminectomy at L4-5 with fusion and at the L5-S1 in 2012 by Dr. Vertell Limber.  Most recently, he had CT lumbar spine in September 2020 showed mild central canal narrowing at L3-4, no severe stenosis or nerve impingement.   Out-side paper records, electronic medical record, and images have been reviewed where available and  summarized as:  CT lumbar spine wo contrast 10/24/2018: Mild central canal narrowing at L3-4 where there is a shallow disc bulge, ligamentum flavum thickening and moderate facet arthropathy. The central canal is otherwise widely patent.  Solid L4-S1 fusion without evidence of complication.  Impression:  1. These findings are suggestive of old multilevel intraspinal canal lesions (i.e. radiculopathy) affecting the right L3-L5 nerve root/segment, mild in degree electrically, and right S1 nerve root/segment, very mild in degree electrically. 2. In the left lower extremity, there is evidence of an old L5 intraspinal canal lesion (i.e. radiculopathy), moderate in degree electrically. 3. In comparison to patient's EMG dated 10/06/2011 performed at [facility], there has been no interval change. 4. There is no evidence of a generalized myopathy or large fiber generalized sensorimotor polyneuropathy affecting the lower extremities    Past Medical History:  Diagnosis Date  . Chronic back pain   . Chronic pain 1999   Bilateral feet (R >L)  . Difficult or painful urination   . High blood pressure   . Nerve damage   . Pain management   . Paresthesia of foot, bilateral   . Weakness of both legs     Past Surgical History:  Procedure Laterality Date  . arm surgery    . BACK SURGERY    . FACIAL COSMETIC SURGERY    . HERNIA REPAIR    . IR US GUIDE BX ASP/DRAIN  10/19/2017  . KIDNEY STONE SURGERY    . MASTECTOMY Left  Medications:  Outpatient Encounter Medications as of 12/02/2018  Medication Sig  . diazepam (VALIUM) 10 MG tablet Take 10 mg by mouth at bedtime.  Marland Kitchen diltiazem (CARDIZEM CD) 120 MG 24 hr capsule Take by mouth daily.  . enalapril (VASOTEC) 5 MG tablet Take by mouth 2 (two) times daily.   Marland Kitchen ibuprofen (ADVIL,MOTRIN) 200 MG tablet Take 800 mg by mouth every 8 (eight) hours as needed for moderate pain.  Marland Kitchen lidocaine (LIDODERM) 5 % Place 1 patch onto the skin daily.  Remove & Discard patch within 12 hours or as directed by MD  . oxyCODONE (ROXICODONE) 15 MG immediate release tablet 15 mg every 4 (four) hours as needed.   . tamsulosin (FLOMAX) 0.4 MG CAPS capsule Take 0.4 mg by mouth daily.  Marland Kitchen venlafaxine XR (EFFEXOR-XR) 37.5 MG 24 hr capsule Take 37.5 mg by mouth 2 (two) times daily.  . [DISCONTINUED] DULoxetine (CYMBALTA) 60 MG capsule Take 60 mg by mouth daily.  . [DISCONTINUED] gabapentin (NEURONTIN) 100 MG capsule Take 100 mg by mouth 3 (three) times daily.  . [DISCONTINUED] levETIRAcetam (KEPPRA) 250 MG tablet Take by mouth.  . [DISCONTINUED] nortriptyline (PAMELOR) 25 MG capsule Start 1 pill (25mg ) at night. In 3 days, increase to 2 pills at night. In 3 days increase to 3 pills at night. In 3 days increase to 4 pills at night and continue   No facility-administered encounter medications on file as of 12/02/2018.     Allergies:  Allergies  Allergen Reactions  . Erythromycin Itching    Family History: Family History  Problem Relation Age of Onset  . Heart disease Father        Living, 35  . Breast cancer Mother        Living, 55  . Hypercholesterolemia Mother   . Healthy Brother   . Healthy Sister     Social History: Social History   Tobacco Use  . Smoking status: Former Smoker    Packs/day: 2.00    Years: 10.00    Pack years: 20.00    Types: Cigarettes  . Smokeless tobacco: Former Systems developer    Quit date: 06/08/1991  Substance Use Topics  . Alcohol use: No  . Drug use: No   Social History   Social History Narrative   Lives with wife.   He is on disability since 2011 due to chronic back pain.      Caffeine - one mountain dew in the morning     Review of Systems:  CONSTITUTIONAL: No fevers, chills, night sweats, or weight loss.   EYES: No visual changes or eye pain ENT: No hearing changes.  No history of nose bleeds.   RESPIRATORY: No cough, wheezing and shortness of breath.   CARDIOVASCULAR: Negative for chest pain, and  palpitations.   GI: Negative for abdominal discomfort, blood in stools or black stools.  No recent change in bowel habits.   GU:  No history of incontinence.   MUSCLOSKELETAL: No history of joint pain or swelling.  No myalgias.   SKIN: Negative for lesions, rash, and itching.   HEMATOLOGY/ONCOLOGY: Negative for prolonged bleeding, bruising easily, and swollen nodes.  No history of cancer.   ENDOCRINE: Negative for cold or heat intolerance, polydipsia or goiter.   PSYCH:  No depression or anxiety symptoms.   NEURO: As Above.   Vital Signs:  BP (!) 144/80   Pulse 75   Ht 5\' 10"  (1.778 m)   Wt 199 lb (90.3 kg)   SpO2  98%   BMI 28.55 kg/m    General Medical Exam:   General:  Well appearing, comfortable, sitting in wheelchair.   Eyes/ENT: see cranial nerve examination.   Neck:   No carotid bruits. Respiratory:  Clear to auscultation, good air entry bilaterally.   Cardiac:  Regular rate and rhythm, no murmur.   Extremities:  No deformities, edema, or skin discoloration.  Skin:  No rashes or lesions.  Neurological Exam: MENTAL STATUS including orientation to time, place, person, recent and remote memory, attention span and concentration, language, and fund of knowledge is normal.  Speech is not dysarthric.  CRANIAL NERVES: II:  No visual field defects.     III-IV-VI: Pupils equal round and reactive to light.  Normal conjugate, extra-ocular eye movements in all directions of gaze.  No nystagmus.  No ptosis.   V:  Normal facial sensation.    VII:  Normal facial symmetry and movements.   VIII:  Normal hearing and vestibular function.   IX-X:  Normal palatal movement.   XI:  Normal shoulder shrug and head rotation.   XII:  Normal tongue strength and range of motion, no deviation or fasciculation.  MOTOR:  Moderate atrophy of the right hip flexors.  No fasciculations or abnormal movements.  No pronator drift.   Upper Extremity:  Right  Left  Deltoid  5/5   5/5   Biceps  5/5   5/5    Triceps  5/5   5/5   Infraspinatus 5/5  5/5  Medial pectoralis 5/5  5/5  Wrist extensors  5/5   5/5   Wrist flexors  5/5   5/5   Finger extensors  5/5   5/5   Finger flexors  5/5   5/5   Dorsal interossei  5/5   5/5   Abductor pollicis  5/5   5/5   Tone (Ashworth scale)  0  0   Lower Extremity:  Right  Left  Hip flexors  2+/5   4+/5   Hip extensors  5/5   5/5   Adductor 5-/5  5/5  Abductor 5-/5  5/5  Knee flexors  5-/5   5/5   Knee extensors  5/5   5/5   Dorsiflexors  5/5   5/5   Plantarflexors  5/5   5/5   Toe extensors  5/5   5/5   Toe flexors  5/5   5/5   Tone (Ashworth scale)  0  0   MSRs:  Right        Left                  brachioradialis 2+  2+  biceps 2+  2+  triceps 2+  2+  patellar 3+  3+  ankle jerk 0  0  Hoffman no  no  plantar response mute  mute   SENSORY:Absent vibration at the ankles bilaterally and reduced pin prick/temperature below the mid-calf bilaterally.     COORDINATION/GAIT: Normal finger-to- nose-finger.  Intact rapid alternating movements bilaterally.  Gait not tested, patient in wheelchair.    IMPRESSION: Luke Foster is a 68 year-old man referred for evaluation bilateral hip flexor weakness, worse on the right.  He has notable history of chronic back pain s/p decompressive laminectomy at L4-5 with microdissection and foraminectomies of the right L4 and L5 nerve roots and redo laminectomy and fusion at L4-5 and L5-S1 (2012).  Prior EMG in 2014 performed by myself showed right L3-5 radiculopathy and left L5 nerve radiculopathy.  Sensory responses were normal.  His exam today continues to show marked weakness with hip flexion on the right, brisk patella reflexes, and distal sensory loss.  I suspect that he has chronic nerve injury from compressive degenerative pathology in the spine.  His exam suggest overlapping neuropathy in the feet, however I am surprised this was not present on his EMG from 2014.  He will undergo repeat NCS/EMG of the legs to  assess for inteval change, specifically to look for more patchy and progressive pathology such as polyradiculoneuropathy.  Further recommendations pending results.   Thank you for allowing me to participate in patient's care.  If I can answer any additional questions, I would be pleased to do so.    Sincerely,    Osmar Howton K. Posey Pronto, DO

## 2018-12-26 DIAGNOSIS — G894 Chronic pain syndrome: Secondary | ICD-10-CM | POA: Diagnosis not present

## 2018-12-26 DIAGNOSIS — M5136 Other intervertebral disc degeneration, lumbar region: Secondary | ICD-10-CM | POA: Diagnosis not present

## 2018-12-29 ENCOUNTER — Encounter: Payer: Medicare Other | Admitting: Neurology

## 2019-01-03 DIAGNOSIS — W19XXXA Unspecified fall, initial encounter: Secondary | ICD-10-CM | POA: Diagnosis not present

## 2019-01-03 DIAGNOSIS — Z743 Need for continuous supervision: Secondary | ICD-10-CM | POA: Diagnosis not present

## 2019-01-03 DIAGNOSIS — R531 Weakness: Secondary | ICD-10-CM | POA: Diagnosis not present

## 2019-01-09 ENCOUNTER — Other Ambulatory Visit: Payer: Self-pay

## 2019-01-10 ENCOUNTER — Encounter: Payer: Medicare Other | Admitting: Neurology

## 2019-01-18 ENCOUNTER — Encounter: Payer: Medicare Other | Admitting: Neurology

## 2019-01-18 ENCOUNTER — Ambulatory Visit (INDEPENDENT_AMBULATORY_CARE_PROVIDER_SITE_OTHER): Payer: Medicare Other | Admitting: Neurology

## 2019-01-18 ENCOUNTER — Other Ambulatory Visit: Payer: Self-pay

## 2019-01-18 DIAGNOSIS — G629 Polyneuropathy, unspecified: Secondary | ICD-10-CM

## 2019-01-18 DIAGNOSIS — Z981 Arthrodesis status: Secondary | ICD-10-CM

## 2019-01-18 DIAGNOSIS — R29898 Other symptoms and signs involving the musculoskeletal system: Secondary | ICD-10-CM

## 2019-01-18 DIAGNOSIS — M5417 Radiculopathy, lumbosacral region: Secondary | ICD-10-CM

## 2019-01-19 NOTE — Procedures (Signed)
[facility]  [address], [place]  [place], [place] [address] Tel: [phone] Fax:  [phone] Test Date:  01/18/2019  Patient: Luke Foster DOB: July 11, 1950 Physician: Narda Amber, DO  Sex: Male Height: 5\' 10"  Ref Phys: Narda Amber, DO  ID#: [id number] Temp: 32.0C Technician:    Patient Complaints: This is a 68 year old man with history of lumbar surgery referred for evaluation of right > left leg weakness and paresthesias.    NCV & EMG Findings: Extensive electrodiagnostic testing of the right lower extremity and additional studies of the left shows:  1. Bilateral sural and superficial peroneal sensory responses are within normal limits, although mildly reduced when compared to prior study dated 12/22/2012. 2. Left peroneal motor response shows reduced amplitude (1.3 mV) at the extensor digitorum brevis, and is normal at the tibialis anterior.  Right peroneal and bilateral tibial motor responses are within normal limits. 3. Bilateral tibial H reflex studies are absent. 4. Chronic motor axonal loss changes are seen affecting the right gluteus medius, vastus lateralis, abductor longus, and iliopsoas muscles and to a lesser degree the left rectus femoris muscle.  Fibrillation potentials are not seen in any of the tested muscles.  Impression: 1. The electrophysiologic findings are most consistent with a chronic right L2-4 intraspinal canal lesion (i.e. radiculopathy), moderate-to-severe.  Findings are stable when compared to study on 12/22/2012. 2. Previously seen bilateral L5 radiculopathy and right S1 radiculopathy is no longer evident on today's study. 3. There is no evidence of a large fiber sensorimotor polyneuropathy affecting the lower extremities.   ___________________________ Narda Amber, DO    Nerve Conduction Studies Anti Sensory Summary Table   Site NR Peak (ms) Norm Peak (ms) P-T Amp (V) Norm P-T Amp  Left Sup Peroneal Anti Sensory (Ant Lat Mall)   32C  12 cm    2.9 <4.6 6.3 >3  Right Sup Peroneal Anti Sensory (Ant Lat Mall)  32C  12 cm    2.4 <4.6 6.9 >3  Left Sural Anti Sensory (Lat Mall)  32C  Calf    3.0 <4.6 6.9 >3  Right Sural Anti Sensory (Lat Mall)  32C  Calf    2.7 <4.6 5.0 >3   Motor Summary Table   Site NR Onset (ms) Norm Onset (ms) O-P Amp (mV) Norm O-P Amp Site1 Site2 Delta-0 (ms) Dist (cm) Vel (m/s) Norm Vel (m/s)  Left Peroneal Motor (Ext Dig Brev)  32C  Ankle    5.9 <6.0 1.3 >2.5 B Fib Ankle 8.7 36.0 41 >40  B Fib    14.6  1.0  Poplt B Fib 1.0 9.0 90 >40  Poplt    15.6  0.9         Right Peroneal Motor (Ext Dig Brev)  32C  Ankle    4.2 <6.0 3.1 >2.5 B Fib Ankle 8.1 39.0 48 >40  B Fib    12.3  2.5  Poplt B Fib 1.8 9.0 50 >40  Poplt    14.1  2.4         Left Peroneal TA Motor (Tib Ant)  32C  Fib Head    3.2 <4.5 5.1 >3 Poplit Fib Head 1.3 9.0 69 >40  Poplit    4.5  4.8         Right Peroneal TA Motor (Tib Ant)  32C  Fib Head    3.9 <4.5 4.5 >3 Poplit Fib Head 1.6 10.0 63 >40  Poplit    5.5  4.4  Left Tibial Motor (Abd Hall Brev)  32C  Ankle    4.4 <6.0 11.5 >4 Knee Ankle 10.4 46.0 44 >40  Knee    14.8  7.0         Right Tibial Motor (Abd Hall Brev)  32C  Ankle    3.1 <6.0 7.5 >4 Knee Ankle 9.2 45.0 49 >40  Knee    12.3  5.0          H Reflex Studies   NR H-Lat (ms) Lat Norm (ms) L-R H-Lat (ms)  Left Tibial (Gastroc)  32C  NR  <35   Right Tibial (Gastroc)  32C  NR  <35    EMG   Side Muscle Ins Act Fibs Psw Fasc Number Recrt Dur Dur. Amp Amp. Poly Poly. Comment  Right AntTibialis Nml Nml Nml Nml Nml Nml Nml Nml Nml Nml Nml Nml N/A  Right GluteusMed Nml Nml Nml Nml 1- Rapid Few 1+ Few 1+ Nml Nml N/A  Right BicepsFemS Nml Nml Nml Nml Nml Nml Nml Nml Nml Nml Nml Nml N/A  Right VastusLat Nml Nml Nml Nml 3- Rapid All 1+ All 2+ All 1+ N/A  Right Gastroc Nml Nml Nml Nml Nml Nml Nml Nml Nml Nml Nml Nml N/A  Right Flex Dig Long Nml Nml Nml Nml Nml Nml Nml Nml Nml Nml Nml Nml N/A  Right  AdductorLong Nml Nml Nml Nml 1- Rapid Some 1+ Some 1+ Nml Nml N/A  Right Iliopsoas Nml Nml Nml Nml 2- Rapid Many 1+ Many 1+ Many 1+ N/A  Left AntTibialis Nml Nml Nml Nml Nml Nml Nml Nml Nml Nml Nml Nml N/A  Left Gastroc Nml Nml Nml Nml Nml Nml Nml Nml Nml Nml Nml Nml N/A  Left Flex Dig Long Nml Nml Nml Nml Nml Nml Nml Nml Nml Nml Nml Nml N/A  Left RectFemoris Nml Nml Nml Nml 1- Rapid Some 1+ Some 1+ Some 1+ N/A  Left GluteusMed Nml Nml Nml Nml Nml Nml Nml Nml Nml Nml Nml Nml N/A      Waveforms:

## 2019-01-20 ENCOUNTER — Telehealth: Payer: Self-pay | Admitting: Neurology

## 2019-01-20 NOTE — Telephone Encounter (Signed)
Results of EMG discussed which is unchanged from prior study in 2014.  Findings continue to show chronic L2-4 radiculopathy on the right.  No evidence of large fiber neuropathy,  Specifically, no evidence of polyradiculoneuropathy or an evolving process.  Given no evidence of neuropathy, diagnostic yield of CSF testing or nerve testing remains low, so it was decided not to pursue.  Understandably, patient was disappointment that testing did not reveal anything new.  I offered a second opinion at one of the academic centers to see if they can offer alternative suggestions.  He will think about this and let me know.   Alayza Pieper K. Posey Pronto, DO

## 2019-01-31 ENCOUNTER — Encounter: Payer: Medicare Other | Admitting: Neurology

## 2019-01-31 DIAGNOSIS — K5900 Constipation, unspecified: Secondary | ICD-10-CM | POA: Diagnosis not present

## 2019-01-31 DIAGNOSIS — M5136 Other intervertebral disc degeneration, lumbar region: Secondary | ICD-10-CM | POA: Diagnosis not present

## 2019-01-31 DIAGNOSIS — G894 Chronic pain syndrome: Secondary | ICD-10-CM | POA: Diagnosis not present

## 2019-02-09 ENCOUNTER — Other Ambulatory Visit: Payer: Self-pay | Admitting: Neurosurgery

## 2019-02-09 ENCOUNTER — Other Ambulatory Visit: Payer: Self-pay | Admitting: Neurology

## 2019-02-09 DIAGNOSIS — G8929 Other chronic pain: Secondary | ICD-10-CM

## 2019-02-09 DIAGNOSIS — R29898 Other symptoms and signs involving the musculoskeletal system: Secondary | ICD-10-CM

## 2019-02-09 DIAGNOSIS — M5442 Lumbago with sciatica, left side: Secondary | ICD-10-CM

## 2019-02-09 DIAGNOSIS — I10 Essential (primary) hypertension: Secondary | ICD-10-CM | POA: Diagnosis not present

## 2019-02-09 DIAGNOSIS — G894 Chronic pain syndrome: Secondary | ICD-10-CM | POA: Diagnosis not present

## 2019-02-09 DIAGNOSIS — M5441 Lumbago with sciatica, right side: Secondary | ICD-10-CM | POA: Diagnosis not present

## 2019-02-20 ENCOUNTER — Encounter
Admission: RE | Admit: 2019-02-20 | Discharge: 2019-02-20 | Disposition: A | Discharge status: Home / Self Care | Payer: Medicare Other | Source: Ambulatory Visit | Attending: Neurosurgery | Admitting: Neurosurgery

## 2019-02-20 DIAGNOSIS — Z01818 Encounter for other preprocedural examination: Secondary | ICD-10-CM | POA: Insufficient documentation

## 2019-02-20 DIAGNOSIS — I10 Essential (primary) hypertension: Secondary | ICD-10-CM | POA: Insufficient documentation

## 2019-02-20 HISTORY — DX: Unspecified osteoarthritis, unspecified site: M19.90

## 2019-02-20 HISTORY — DX: Sleep apnea, unspecified: G47.30

## 2019-02-20 HISTORY — DX: Personal history of urinary calculi: Z87.442

## 2019-02-20 NOTE — Patient Instructions (Signed)
Your procedure is scheduled on: 03-01-19 [facility] Report to Same Day Surgery 2nd floor medical mall [facility] Entrance-take elevator on left to 2nd floor.  Check in with surgery information desk.) To find out your arrival time please call [phone] between 1PM - 3PM on 02-28-19 TUESDAY  Remember: Instructions that are not followed completely may result in serious medical risk, up to and including death, or upon the discretion of your surgeon and anesthesiologist your surgery may need to be rescheduled.    _x___ 1. Do not eat food after midnight the night before your procedure. NO GUM OR CANDY AFTER MIDNIGHT. You may drink clear liquids up to 2 hours before you are scheduled to arrive at the hospital for your procedure.  Do not drink clear liquids within 2 hours of your scheduled arrival to the hospital.  Clear liquids include  --Water or Apple juice without pulp  --Gatorade  --Black Coffee or Clear Tea (No milk, no creamers, do not add anything to the coffee or Tea   ____Ensure clear carbohydrate drink on the way to the hospital for bariatric patients  ____Ensure clear carbohydrate drink 3 hours before surgery.     __x__ 2. No Alcohol for 24 hours before or after surgery.   __x__3. No Smoking or e-cigarettes for 24 prior to surgery.  Do not use any chewable tobacco products for at least 6 hour prior to surgery   ____  4. Bring all medications with you on the day of surgery if instructed.    __x__ 5. Notify your doctor if there is any change in your medical condition     (cold, fever, infections).    x___6. On the morning of surgery brush your teeth with toothpaste and water.  You may rinse your mouth with mouth wash if you wish.  Do not swallow any toothpaste or mouthwash.   Do not wear jewelry, make-up, hairpins, clips or nail polish.  Do not wear lotions, powders, or perfumes. You may wear deodorant.  Do not shave 48 hours prior to surgery. Men may shave face and neck.  Do  not bring valuables to the hospital.    [facility] is not responsible for any belongings or valuables.               Contacts, dentures or bridgework may not be worn into surgery.  Leave your suitcase in the car. After surgery it may be brought to your room.  For patients admitted to the hospital, discharge time is determined by your                       treatment team.  _  Patients discharged the day of surgery will not be allowed to drive home.  You will need someone to drive you home and stay with you the night of your procedure.    Please read over the following fact sheets that you were given:   [facility] Preparing for Surgery and or MRSA Information   _x___ TAKE THE FOLLOWING MEDICATION THE MORNING OF SURGERY WITH A SMALL SIP OF WATER. These include:  1. OXYCODONE  2. EFFEXOR (VENLAFAXINE)  3.  4.  5.  6.  ____Fleets enema or Magnesium Citrate as directed.   _x___ Use CHG Soap or sage wipes as directed on instruction sheet   ____ Use inhalers on the day of surgery and bring to hospital day of surgery  ____ Stop Metformin and Janumet 2 days prior to surgery.  ____ Take 1/2 of usual insulin dose the night before surgery and none on the morning surgery.   ____ Follow recommendations from Cardiologist, Pulmonologist or PCP regarding stopping Aspirin, Coumadin, Plavix ,Eliquis, Effient, or Pradaxa, and Pletal.  X____Stop Anti-inflammatories such as Advil, Aleve, Ibuprofen, Motrin, Naproxen, Naprosyn, Goodies powders or aspirin products 7 DAYS PRIOR TO SURGERY-OK to take Tylenol   _x___ Stop supplements until after surgery.     _X___ Bring C-Pap to the hospital.

## 2019-02-24 ENCOUNTER — Other Ambulatory Visit: Payer: Self-pay

## 2019-02-24 ENCOUNTER — Encounter
Admission: RE | Admit: 2019-02-24 | Discharge: 2019-02-24 | Disposition: A | Discharge status: Home / Self Care | Payer: Medicare Other | Source: Ambulatory Visit | Attending: Neurosurgery | Admitting: Neurosurgery

## 2019-02-24 DIAGNOSIS — I10 Essential (primary) hypertension: Secondary | ICD-10-CM | POA: Insufficient documentation

## 2019-02-24 DIAGNOSIS — Z01818 Encounter for other preprocedural examination: Secondary | ICD-10-CM | POA: Diagnosis not present

## 2019-02-24 LAB — CBC
HCT: 44.2 % (ref 39.0–52.0)
Hemoglobin: 14.4 g/dL (ref 13.0–17.0)
MCH: 29 pg (ref 26.0–34.0)
MCHC: 32.6 g/dL (ref 30.0–36.0)
MCV: 88.9 fL (ref 80.0–100.0)
Platelets: 220 10*3/uL (ref 150–400)
RBC: 4.97 MIL/uL (ref 4.22–5.81)
RDW: 11.8 % (ref 11.5–15.5)
WBC: 5.7 10*3/uL (ref 4.0–10.5)
nRBC: 0 % (ref 0.0–0.2)

## 2019-02-24 LAB — BASIC METABOLIC PANEL
Anion gap: 8 (ref 5–15)
BUN: 19 mg/dL (ref 8–23)
CO2: 31 mmol/L (ref 22–32)
Calcium: 9.6 mg/dL (ref 8.9–10.3)
Chloride: 101 mmol/L (ref 98–111)
Creatinine, Ser: 1.13 mg/dL (ref 0.61–1.24)
GFR calc Af Amer: >60 mL/min (ref >60)
GFR calc non Af Amer: >60 mL/min (ref >60)
Glucose, Bld: 86 mg/dL (ref 70–99)
Potassium: 3.5 mmol/L (ref 3.5–5.1)
Sodium: 140 mmol/L (ref 135–145)

## 2019-02-24 LAB — SURGICAL PCR SCREEN
MRSA, PCR: POSITIVE — AB
Staphylococcus aureus: POSITIVE — AB

## 2019-02-24 LAB — APTT: aPTT: 28 seconds (ref 24–36)

## 2019-02-24 LAB — TYPE AND SCREEN
ABO/RH(D): A POS
Antibody Screen: NEGATIVE

## 2019-02-24 LAB — PROTIME-INR
INR: 1 (ref 0.8–1.2)
Prothrombin Time: 13.4 seconds (ref 11.4–15.2)

## 2019-02-27 ENCOUNTER — Other Ambulatory Visit
Admission: RE | Admit: 2019-02-27 | Discharge: 2019-02-27 | Disposition: A | Discharge status: Home / Self Care | Payer: Medicare Other | Source: Ambulatory Visit | Attending: Neurosurgery | Admitting: Neurosurgery

## 2019-02-27 DIAGNOSIS — Z01812 Encounter for preprocedural laboratory examination: Secondary | ICD-10-CM | POA: Insufficient documentation

## 2019-02-27 DIAGNOSIS — Z20822 Contact with and (suspected) exposure to covid-19: Secondary | ICD-10-CM | POA: Diagnosis not present

## 2019-02-27 LAB — URINALYSIS, ROUTINE W REFLEX MICROSCOPIC
Bilirubin Urine: NEGATIVE
Glucose, UA: NEGATIVE mg/dL
Hgb urine dipstick: NEGATIVE
Ketones, ur: NEGATIVE mg/dL
Leukocytes,Ua: NEGATIVE
Nitrite: NEGATIVE
Protein, ur: NEGATIVE mg/dL
Specific Gravity, Urine: 1.013 (ref 1.005–1.030)
pH: 6 (ref 5.0–8.0)

## 2019-02-27 LAB — SARS CORONAVIRUS 2 (TAT 6-24 HRS): SARS Coronavirus 2: NEGATIVE

## 2019-02-27 NOTE — Pre-Procedure Instructions (Signed)
Consulted Dr. Randa Lynn about abnormal EKG with none for comparison.  Instructed to send copy to primary for comparison/medical clearance.  Dr. Arvella Nigh notified of need for clearance.

## 2019-02-27 NOTE — Pre-Procedure Instructions (Signed)
Dr. Gerarda Fraction notified of need for clearance.  States will need to see the patient.  Faxed clearance form to office and office will arrange for appointment.

## 2019-02-28 DIAGNOSIS — R9431 Abnormal electrocardiogram [ECG] [EKG]: Secondary | ICD-10-CM | POA: Diagnosis not present

## 2019-02-28 DIAGNOSIS — I700 Atherosclerosis of aorta: Secondary | ICD-10-CM | POA: Insufficient documentation

## 2019-02-28 DIAGNOSIS — E782 Mixed hyperlipidemia: Secondary | ICD-10-CM | POA: Insufficient documentation

## 2019-02-28 DIAGNOSIS — I10 Essential (primary) hypertension: Secondary | ICD-10-CM | POA: Diagnosis not present

## 2019-02-28 DIAGNOSIS — G894 Chronic pain syndrome: Secondary | ICD-10-CM | POA: Diagnosis not present

## 2019-02-28 DIAGNOSIS — Z7689 Persons encountering health services in other specified circumstances: Secondary | ICD-10-CM | POA: Diagnosis not present

## 2019-03-01 ENCOUNTER — Ambulatory Visit: Payer: Medicare Other

## 2019-03-01 ENCOUNTER — Emergency Department (HOSPITAL_COMMUNITY)
Admission: EM | Admit: 2019-03-01 | Discharge: 2019-03-01 | Disposition: A | Discharge status: Home / Self Care | Payer: Medicare Other | Attending: Emergency Medicine | Admitting: Emergency Medicine

## 2019-03-01 ENCOUNTER — Encounter
Admission: RE | Disposition: A | Discharge status: Home / Self Care | Payer: Self-pay | Source: Home / Self Care | Attending: Neurosurgery

## 2019-03-01 ENCOUNTER — Other Ambulatory Visit: Payer: Self-pay

## 2019-03-01 ENCOUNTER — Ambulatory Visit: Payer: Medicare Other | Admitting: Anesthesiology

## 2019-03-01 ENCOUNTER — Ambulatory Visit
Admission: RE | Admit: 2019-03-01 | Discharge: 2019-03-01 | Disposition: A | Discharge status: Home / Self Care | Payer: Medicare Other | Attending: Neurosurgery | Admitting: Neurosurgery

## 2019-03-01 ENCOUNTER — Encounter (HOSPITAL_COMMUNITY): Payer: Self-pay | Admitting: Emergency Medicine

## 2019-03-01 ENCOUNTER — Encounter: Payer: Self-pay | Admitting: Neurosurgery

## 2019-03-01 DIAGNOSIS — Z79899 Other long term (current) drug therapy: Secondary | ICD-10-CM | POA: Insufficient documentation

## 2019-03-01 DIAGNOSIS — Z881 Allergy status to other antibiotic agents status: Secondary | ICD-10-CM | POA: Insufficient documentation

## 2019-03-01 DIAGNOSIS — I739 Peripheral vascular disease, unspecified: Secondary | ICD-10-CM | POA: Insufficient documentation

## 2019-03-01 DIAGNOSIS — T85193A Other mechanical complication of implanted electronic neurostimulator, generator, initial encounter: Secondary | ICD-10-CM | POA: Diagnosis not present

## 2019-03-01 DIAGNOSIS — E785 Hyperlipidemia, unspecified: Secondary | ICD-10-CM | POA: Diagnosis not present

## 2019-03-01 DIAGNOSIS — G629 Polyneuropathy, unspecified: Secondary | ICD-10-CM | POA: Insufficient documentation

## 2019-03-01 DIAGNOSIS — Z981 Arthrodesis status: Secondary | ICD-10-CM | POA: Insufficient documentation

## 2019-03-01 DIAGNOSIS — G894 Chronic pain syndrome: Secondary | ICD-10-CM | POA: Diagnosis not present

## 2019-03-01 DIAGNOSIS — M5441 Lumbago with sciatica, right side: Secondary | ICD-10-CM | POA: Diagnosis not present

## 2019-03-01 DIAGNOSIS — M5442 Lumbago with sciatica, left side: Secondary | ICD-10-CM | POA: Diagnosis not present

## 2019-03-01 DIAGNOSIS — Z87891 Personal history of nicotine dependence: Secondary | ICD-10-CM | POA: Diagnosis not present

## 2019-03-01 DIAGNOSIS — G4730 Sleep apnea, unspecified: Secondary | ICD-10-CM | POA: Diagnosis not present

## 2019-03-01 DIAGNOSIS — F329 Major depressive disorder, single episode, unspecified: Secondary | ICD-10-CM | POA: Diagnosis not present

## 2019-03-01 DIAGNOSIS — X58XXXA Exposure to other specified factors, initial encounter: Secondary | ICD-10-CM | POA: Diagnosis not present

## 2019-03-01 DIAGNOSIS — I10 Essential (primary) hypertension: Secondary | ICD-10-CM | POA: Diagnosis not present

## 2019-03-01 DIAGNOSIS — Z9889 Other specified postprocedural states: Secondary | ICD-10-CM | POA: Insufficient documentation

## 2019-03-01 DIAGNOSIS — R339 Retention of urine, unspecified: Secondary | ICD-10-CM | POA: Insufficient documentation

## 2019-03-01 DIAGNOSIS — Z743 Need for continuous supervision: Secondary | ICD-10-CM | POA: Diagnosis not present

## 2019-03-01 DIAGNOSIS — R52 Pain, unspecified: Secondary | ICD-10-CM | POA: Diagnosis not present

## 2019-03-01 DIAGNOSIS — Z419 Encounter for procedure for purposes other than remedying health state, unspecified: Secondary | ICD-10-CM

## 2019-03-01 DIAGNOSIS — M455 Ankylosing spondylitis of thoracolumbar region: Secondary | ICD-10-CM | POA: Diagnosis not present

## 2019-03-01 HISTORY — PX: LUMBAR SPINAL CORD SIMULATOR LEAD REMOVAL: SHX6819

## 2019-03-01 HISTORY — PX: SPINAL CORD STIMULATOR REMOVAL: SHX5379

## 2019-03-01 LAB — ABO/RH: ABO/RH(D): A POS

## 2019-03-01 SURGERY — LUMBAR SPINAL CORD STIMULATOR LEAD REMOVAL
Anesthesia: General

## 2019-03-01 MED ORDER — LIDOCAINE HCL (PF) 2 % IJ SOLN
INTRAMUSCULAR | Status: AC
  Filled 2019-03-01: qty 10

## 2019-03-01 MED ORDER — FAMOTIDINE 20 MG PO TABS
20.0000 mg | ORAL_TABLET | Freq: Once | ORAL | Status: AC
  Administered 2019-03-01: 20 mg via ORAL

## 2019-03-01 MED ORDER — BUPIVACAINE HCL 0.5 % IJ SOLN
INTRAMUSCULAR | Status: DC | PRN
  Administered 2019-03-01: 20 mL

## 2019-03-01 MED ORDER — SUGAMMADEX SODIUM 200 MG/2ML IV SOLN
INTRAVENOUS | Status: AC
  Filled 2019-03-01: qty 2

## 2019-03-01 MED ORDER — FENTANYL CITRATE (PF) 100 MCG/2ML IJ SOLN
INTRAMUSCULAR | Status: AC
  Filled 2019-03-01: qty 2

## 2019-03-01 MED ORDER — ONDANSETRON HCL 4 MG/2ML IJ SOLN
INTRAMUSCULAR | Status: AC
  Filled 2019-03-01: qty 2

## 2019-03-01 MED ORDER — SUCCINYLCHOLINE CHLORIDE 20 MG/ML IJ SOLN
INTRAMUSCULAR | Status: DC | PRN
  Administered 2019-03-01: 100 mg via INTRAVENOUS

## 2019-03-01 MED ORDER — BUPIVACAINE LIPOSOME 1.3 % IJ SUSP
INTRAMUSCULAR | Status: AC
  Filled 2019-03-01: qty 20

## 2019-03-01 MED ORDER — FAMOTIDINE 20 MG PO TABS
ORAL_TABLET | ORAL | Status: AC
  Filled 2019-03-01: qty 1

## 2019-03-01 MED ORDER — SODIUM CHLORIDE 0.9 % IV SOLN
INTRAVENOUS | Status: DC | PRN
  Administered 2019-03-01: 40 mL

## 2019-03-01 MED ORDER — THROMBIN 5000 UNITS EX SOLR
CUTANEOUS | Status: DC | PRN
  Administered 2019-03-01: 5000 [IU] via TOPICAL

## 2019-03-01 MED ORDER — BACITRACIN 50000 UNITS IM SOLR
INTRAMUSCULAR | Status: AC
  Filled 2019-03-01: qty 1

## 2019-03-01 MED ORDER — VANCOMYCIN HCL 1250 MG/250ML IV SOLN
1250.0000 mg | Freq: Once | INTRAVENOUS | Status: AC
  Administered 2019-03-01: 1250 mg via INTRAVENOUS
  Filled 2019-03-01: qty 250

## 2019-03-01 MED ORDER — ROCURONIUM BROMIDE 50 MG/5ML IV SOLN
INTRAVENOUS | Status: AC
  Filled 2019-03-01: qty 1

## 2019-03-01 MED ORDER — FENTANYL CITRATE (PF) 100 MCG/2ML IJ SOLN: 25.0000 ug | INTRAMUSCULAR | Status: DC | PRN

## 2019-03-01 MED ORDER — LIDOCAINE HCL (CARDIAC) PF 100 MG/5ML IV SOSY
PREFILLED_SYRINGE | INTRAVENOUS | Status: DC | PRN
  Administered 2019-03-01: 100 mg via INTRAVENOUS

## 2019-03-01 MED ORDER — VASOPRESSIN 20 UNIT/ML IV SOLN
INTRAVENOUS | Status: DC | PRN
  Administered 2019-03-01: 1 m[IU] via INTRAVENOUS
  Administered 2019-03-01: .5 m[IU] via INTRAVENOUS
  Administered 2019-03-01 (×5): 1 m[IU] via INTRAVENOUS
  Administered 2019-03-01: .5 m[IU] via INTRAVENOUS
  Administered 2019-03-01: 1 m[IU] via INTRAVENOUS

## 2019-03-01 MED ORDER — BUPIVACAINE HCL (PF) 0.5 % IJ SOLN
INTRAMUSCULAR | Status: AC
  Filled 2019-03-01: qty 30

## 2019-03-01 MED ORDER — SODIUM CHLORIDE 0.9 % IR SOLN
Status: DC | PRN
  Administered 2019-03-01: 1000 mL

## 2019-03-01 MED ORDER — PHENYLEPHRINE HCL (PRESSORS) 10 MG/ML IV SOLN
INTRAVENOUS | Status: DC | PRN
  Administered 2019-03-01 (×2): 100 ug via INTRAVENOUS
  Administered 2019-03-01: 200 ug via INTRAVENOUS
  Administered 2019-03-01 (×3): 100 ug via INTRAVENOUS
  Administered 2019-03-01: 50 ug via INTRAVENOUS
  Administered 2019-03-01: 100 ug via INTRAVENOUS
  Administered 2019-03-01: 200 ug via INTRAVENOUS

## 2019-03-01 MED ORDER — SUCCINYLCHOLINE CHLORIDE 20 MG/ML IJ SOLN
INTRAMUSCULAR | Status: AC
  Filled 2019-03-01: qty 1

## 2019-03-01 MED ORDER — PROPOFOL 10 MG/ML IV BOLUS
INTRAVENOUS | Status: DC | PRN
  Administered 2019-03-01: 100 mg via INTRAVENOUS

## 2019-03-01 MED ORDER — MIDAZOLAM HCL 2 MG/2ML IJ SOLN
INTRAMUSCULAR | Status: AC
  Filled 2019-03-01: qty 2

## 2019-03-01 MED ORDER — SUGAMMADEX SODIUM 200 MG/2ML IV SOLN
INTRAVENOUS | Status: DC | PRN
  Administered 2019-03-01: 200 mg via INTRAVENOUS

## 2019-03-01 MED ORDER — EPINEPHRINE PF 1 MG/ML IJ SOLN
INTRAMUSCULAR | Status: AC
  Filled 2019-03-01: qty 1

## 2019-03-01 MED ORDER — MIDAZOLAM HCL 2 MG/2ML IJ SOLN
INTRAMUSCULAR | Status: DC | PRN
  Administered 2019-03-01: 2 mg via INTRAVENOUS

## 2019-03-01 MED ORDER — BUPIVACAINE-EPINEPHRINE (PF) 0.5% -1:200000 IJ SOLN
INTRAMUSCULAR | Status: DC | PRN
  Administered 2019-03-01: 6 mL

## 2019-03-01 MED ORDER — ONDANSETRON HCL 4 MG/2ML IJ SOLN
INTRAMUSCULAR | Status: DC | PRN
  Administered 2019-03-01: 4 mg via INTRAVENOUS

## 2019-03-01 MED ORDER — PROPOFOL 10 MG/ML IV BOLUS
INTRAVENOUS | Status: AC
  Filled 2019-03-01: qty 20

## 2019-03-01 MED ORDER — ONDANSETRON HCL 4 MG/2ML IJ SOLN: 4.0000 mg | Freq: Once | INTRAMUSCULAR | Status: DC | PRN

## 2019-03-01 MED ORDER — OXYCODONE HCL 5 MG PO TABS: 5.0000 mg | ORAL_TABLET | ORAL | 0 refills | Status: DC | PRN

## 2019-03-01 MED ORDER — DEXAMETHASONE SODIUM PHOSPHATE 10 MG/ML IJ SOLN
INTRAMUSCULAR | Status: AC
  Filled 2019-03-01: qty 1

## 2019-03-01 MED ORDER — FENTANYL CITRATE (PF) 100 MCG/2ML IJ SOLN
INTRAMUSCULAR | Status: DC | PRN
  Administered 2019-03-01 (×3): 25 ug via INTRAVENOUS
  Administered 2019-03-01 (×2): 50 ug via INTRAVENOUS

## 2019-03-01 MED ORDER — DEXAMETHASONE SODIUM PHOSPHATE 10 MG/ML IJ SOLN
INTRAMUSCULAR | Status: DC | PRN
  Administered 2019-03-01: 5 mg via INTRAVENOUS

## 2019-03-01 MED ORDER — LACTATED RINGERS IV SOLN
INTRAVENOUS | Status: DC
  Administered 2019-03-01: 50 mL/h via INTRAVENOUS

## 2019-03-01 MED ORDER — ROCURONIUM BROMIDE 100 MG/10ML IV SOLN
INTRAVENOUS | Status: DC | PRN
  Administered 2019-03-01: 20 mg via INTRAVENOUS
  Administered 2019-03-01: 10 mg via INTRAVENOUS

## 2019-03-01 MED ORDER — SODIUM CHLORIDE FLUSH 0.9 % IV SOLN
INTRAVENOUS | Status: AC
  Filled 2019-03-01: qty 20

## 2019-03-01 SURGICAL SUPPLY — 53 items
ADH SKN CLS APL DERMABOND .7 (GAUZE/BANDAGES/DRESSINGS) ×2
AGENT HMST MTR 8 SURGIFLO (HEMOSTASIS) ×1
APL PRP STRL LF DISP 70% ISPRP (MISCELLANEOUS) ×2
BUR NEURO DRILL SOFT 3.0X3.8M (BURR) ×2 IMPLANT
CANISTER SUCT 1200ML W/VALVE (MISCELLANEOUS) ×4 IMPLANT
CHLORAPREP W/TINT 26 (MISCELLANEOUS) ×4 IMPLANT
COUNTER NEEDLE 20/40 LG (NEEDLE) ×2 IMPLANT
COVER LIGHT HANDLE STERIS (MISCELLANEOUS) ×4 IMPLANT
COVER WAND RF STERILE (DRAPES) ×2 IMPLANT
CUP MEDICINE 2OZ PLAST GRAD ST (MISCELLANEOUS) ×2 IMPLANT
DERMABOND ADVANCED (GAUZE/BANDAGES/DRESSINGS) ×2
DERMABOND ADVANCED .7 DNX12 (GAUZE/BANDAGES/DRESSINGS) ×2 IMPLANT
DRAPE C-ARM 42X72 X-RAY (DRAPES) ×3 IMPLANT
DRAPE LAPAROTOMY 100X77 ABD (DRAPES) ×2 IMPLANT
DRAPE MICROSCOPE SPINE 48X150 (DRAPES) IMPLANT
DRAPE SURG 17X11 SM STRL (DRAPES) ×12 IMPLANT
ELECT CAUTERY BLADE TIP 2.5 (TIP) ×2
ELECT REM PT RETURN 9FT ADLT (ELECTROSURGICAL) ×2
ELECTRODE CAUTERY BLDE TIP 2.5 (TIP) ×1 IMPLANT
ELECTRODE REM PT RTRN 9FT ADLT (ELECTROSURGICAL) ×1 IMPLANT
FEE INTRAOP MONITOR IMPULS NCS (MISCELLANEOUS) IMPLANT
GLOVE BIOGEL PI IND STRL 7.0 (GLOVE) ×1 IMPLANT
GLOVE BIOGEL PI INDICATOR 7.0 (GLOVE) ×1
GLOVE SURG SYN 7.0 (GLOVE) ×4 IMPLANT
GLOVE SURG SYN 7.0 PF PI (GLOVE) ×2 IMPLANT
GLOVE SURG SYN 8.5  E (GLOVE) ×3
GLOVE SURG SYN 8.5 E (GLOVE) ×3 IMPLANT
GLOVE SURG SYN 8.5 PF PI (GLOVE) ×3 IMPLANT
GOWN SRG XL LVL 3 NONREINFORCE (GOWNS) ×1 IMPLANT
GOWN STRL NON-REIN TWL XL LVL3 (GOWNS) ×2
GOWN STRL REUS W/TWL MED LVL3 (GOWN DISPOSABLE) ×2 IMPLANT
GRADUATE 1200CC STRL 31836 (MISCELLANEOUS) ×2 IMPLANT
INTRAOP MONITOR FEE IMPULS NCS (MISCELLANEOUS)
INTRAOP MONITOR FEE IMPULSE (MISCELLANEOUS)
KIT TURNOVER KIT A (KITS) ×2 IMPLANT
MARKER SKIN DUAL TIP RULER LAB (MISCELLANEOUS) ×2 IMPLANT
NDL SAFETY ECLIPSE 18X1.5 (NEEDLE) ×1 IMPLANT
NEEDLE HYPO 18GX1.5 SHARP (NEEDLE) ×2
NEEDLE HYPO 22GX1.5 SAFETY (NEEDLE) ×2 IMPLANT
NS IRRIG 1000ML POUR BTL (IV SOLUTION) ×2 IMPLANT
PACK LAMINECTOMY NEURO (CUSTOM PROCEDURE TRAY) ×2 IMPLANT
SPOGE SURGIFLO 8M (HEMOSTASIS) ×1
SPONGE SURGIFLO 8M (HEMOSTASIS) IMPLANT
STAPLER SKIN PROX 35W (STAPLE) ×2 IMPLANT
SUT SILK 2 0SH CR/8 30 (SUTURE) ×1 IMPLANT
SUT V-LOC 90 ABS DVC 3-0 CL (SUTURE) ×4 IMPLANT
SUT VIC AB 0 CT1 18XCR BRD 8 (SUTURE) ×1 IMPLANT
SUT VIC AB 0 CT1 8-18 (SUTURE) ×2
SUT VIC AB 2-0 CT1 18 (SUTURE) ×3 IMPLANT
SYR 10ML LL (SYRINGE) ×2 IMPLANT
SYR 30ML LL (SYRINGE) ×4 IMPLANT
TOWEL OR 17X26 4PK STRL BLUE (TOWEL DISPOSABLE) ×6 IMPLANT
TUBING CONNECTING 10 (TUBING) ×2 IMPLANT

## 2019-03-01 NOTE — ED Provider Notes (Signed)
[facility] EMERGENCY DEPARTMENT Provider Note   CSN: [id number] Arrival date & time: 03/01/19  2030     History Chief Complaint  Patient presents with  . Urinary Retention    Luke Foster is a 69 y.o. male.  HPI      Luke Foster is a 69 y.o. male who presents to the Emergency Department complaining of urinary retention secondary to surgical anesthesia.  He states that he had an implanted spinal cord stimulator that was surgically removed several hours prior to arrival.  Since his surgical procedure, he notes gradually increasing difficulty with voiding.  He voided a small amount prior to discharge from the hospital and once before arriving home, but has not been able to void for several hours.  He reports pressure and pain to his lower abdomen.  He states he has had multiple surgical procedures in the past and has developed urinary retention frequently.  He denies fever, chills, chest pain, shortness of breath nausea or vomiting.    Past Medical History:  Diagnosis Date  . Arthritis   . Chronic back pain   . Chronic pain 1999   Bilateral feet (R >L)  . Difficult or painful urination   . High blood pressure   . History of kidney stones    H/O  . Nerve damage   . Pain management   . Paresthesia of foot, bilateral   . Sleep apnea    USES CPAP  . Weakness of both legs     Patient Active Problem List   Diagnosis Date Noted  . Neuropathy 01/21/2017  . Pain in right foot 01/21/2017  . Pain in left foot 01/21/2017  . Chronic pain syndrome 01/21/2017  . Post laminectomy syndrome 01/21/2017  . Status post insertion of spinal cord stimulator 01/21/2017  . Uncomplicated opioid dependence ([place]) 01/21/2017  . Lumbosacral radiculopathy 12/09/2012    Past Surgical History:  Procedure Laterality Date  . arm surgery    . BACK SURGERY     X2  . FACIAL COSMETIC SURGERY    . HERNIA REPAIR    . IR US GUIDE BX ASP/DRAIN  10/19/2017  . KIDNEY STONE SURGERY    . MASTECTOMY  Left        Family History  Problem Relation Age of Onset  . Heart disease Father        Living, 85  . Breast cancer Mother        Living, 56  . Hypercholesterolemia Mother   . Healthy Brother   . Healthy Sister     Social History   Tobacco Use  . Smoking status: Former Smoker    Packs/day: 2.00    Years: 10.00    Pack years: 20.00    Types: Cigarettes    Quit date: 05/21/1991    Years since quitting: 27.7  . Smokeless tobacco: Former Systems developer    Quit date: 06/08/1991  Substance Use Topics  . Alcohol use: No  . Drug use: No    Home Medications Prior to Admission medications   Medication Sig Start Date End Date Taking? Authorizing Provider  cyclobenzaprine (FLEXERIL) 10 MG tablet Take 10 mg by mouth 3 (three) times daily as needed for muscle spasms.    [provider]  diazepam (VALIUM) 10 MG tablet Take 10 mg by mouth 2 (two) times daily as needed (restless legs).  09/29/18   [provider]  diltiazem (CARDIZEM CD) 120 MG 24 hr capsule Take 120 mg by mouth  every evening.  09/26/18   [provider]  enalapril (VASOTEC) 5 MG tablet Take 5 mg by mouth 2 (two) times daily.  05/20/16   [provider]  ibuprofen (ADVIL,MOTRIN) 200 MG tablet Take 800 mg by mouth every 8 (eight) hours as needed for moderate pain.    [provider]  lidocaine (LIDODERM) 5 % Place 1 patch onto the skin daily as needed (pain.). Remove & Discard patch within 12 hours or as directed by MD     [provider]  oxyCODONE (ROXICODONE) 15 MG immediate release tablet Take 15 mg by mouth every 4 (four) hours.  11/02/18   [provider]  oxyCODONE (ROXICODONE) 5 MG immediate release tablet Take 1 tablet (5 mg total) by mouth every 4 (four) hours as needed for up to 5 days for breakthrough pain. 03/01/19 03/06/19  Marin Olp, PA-C  tamsulosin (FLOMAX) 0.4 MG CAPS capsule Take 0.4 mg by mouth every evening.     [provider]  venlafaxine XR  (EFFEXOR-XR) 75 MG 24 hr capsule Take 75 mg by mouth 2 (two) times daily. 12/31/18   [provider]    Allergies    Erythromycin  Review of Systems   Review of Systems  Constitutional: Negative for chills, fatigue and fever.  HENT: Negative for trouble swallowing.   Respiratory: Negative for cough, shortness of breath and wheezing.   Cardiovascular: Negative for chest pain.  Gastrointestinal: Negative for abdominal pain, nausea and vomiting.  Genitourinary: Positive for decreased urine volume and difficulty urinating. Negative for dysuria, flank pain and hematuria.  Musculoskeletal: Negative for arthralgias, myalgias, neck pain and neck stiffness.  Skin: Negative for rash.  Neurological: Negative for dizziness, weakness and numbness.  Hematological: Does not bruise/bleed easily.    Physical Exam Updated Vital Signs BP (!) 156/120 (BP Location: Right Arm)   Pulse 97   Temp 99.5 F (37.5 C) (Oral)   Resp 18   Ht 5\' 9"  (1.753 m)   Wt 86.2 kg   SpO2 98%   BMI 28.06 kg/m   Physical Exam Vitals and nursing note reviewed.  Constitutional:      Appearance: Normal appearance. He is not ill-appearing or toxic-appearing.  HENT:     Head: Normocephalic.     Mouth/Throat:     Mouth: Mucous membranes are moist.     Pharynx: Oropharynx is clear.  Cardiovascular:     Rate and Rhythm: Normal rate and regular rhythm.     Pulses: Normal pulses.  Pulmonary:     Effort: Pulmonary effort is normal.     Breath sounds: Normal breath sounds. No wheezing.  Chest:     Chest wall: No tenderness.  Abdominal:     General: There is no distension.     Palpations: Abdomen is soft.     Tenderness: There is no abdominal tenderness. There is no right CVA tenderness, left CVA tenderness or guarding.  Musculoskeletal:        General: Normal range of motion.  Skin:    General: Skin is warm.  Neurological:     General: No focal deficit present.     Mental Status: He is alert.      Sensory: No sensory deficit.     Motor: No weakness.     ED Results / Procedures / Treatments   Labs (all labs ordered are listed, but only abnormal results are displayed) Labs Reviewed - No data to display  EKG None  Radiology   Procedures  Procedures (including critical care time)  Medications Ordered in ED Medications - No data to display  ED Course  I have reviewed the triage vital signs and the nursing notes.  Pertinent labs & imaging results that were available during my care of the patient were reviewed by me and considered in my medical decision making (see chart for details).    MDM Rules/Calculators/A&P                     Patient here with urinary retention secondary to surgical anesthesia.  Patient reports history of same.  Foley catheter was inserted and patient had immediate relief with approximately 1400 cc of yellow-colored urine drained into the Foley bag.   Abdomen is now soft and nontender and patient denies any symptoms at present.  He was switched to a Foley leg bag and agrees to keep Foley in place and follow-up in 2 to 3 days with urology.  Final Clinical Impression(s) / ED Diagnoses Final diagnoses:  Urinary retention    Rx / DC Orders ED Discharge Orders    None       Kem Parkinson, PA-C 03/04/19 0044    Fredia Sorrow, MD 03/11/19 0104

## 2019-03-01 NOTE — H&P (Signed)
  I have reviewed and confirmed my history and physical from 02/09/2019 with no additions or changes. Plan for removal of a spinal cord stimulator.  Risks and benefits reviewed.  Heart sounds normal no MRG. Chest Clear to Auscultation Bilaterally.

## 2019-03-01 NOTE — Discharge Instructions (Addendum)
Keep the Foley catheter in place and call [facility] tomorrow to arrange a follow-up appointment.

## 2019-03-01 NOTE — Transfer of Care (Signed)
Immediate Anesthesia Transfer of Care Note  Patient: Luke Foster  Procedure(s) Performed: LUMBAR SPINAL CORD SIMULATOR LEAD REMOVAL (N/A ) LUMBAR SPINAL CORD STIMULATOR REMOVAL (N/A )  Patient Location: PACU  Anesthesia Type:General  Level of Consciousness: sedated and drowsy  Airway & Oxygen Therapy: Patient Spontanous Breathing and Patient connected to face mask oxygen  Post-op Assessment: Report given to RN and Post -op Vital signs reviewed and stable  Post vital signs: Reviewed and stable  Last Vitals:  Vitals Value Taken Time  BP 105/80 03/01/19 1335  Temp    Pulse 69 03/01/19 1337  Resp 14 03/01/19 1337  SpO2 95 % 03/01/19 1337  Vitals shown include unvalidated device data.  Last Pain:  Vitals:   03/01/19 0948  TempSrc: Tympanic  PainSc: 9          Complications: No apparent anesthesia complications

## 2019-03-01 NOTE — ED Triage Notes (Signed)
Pt had surgery this morning to have stimulator removed from back. Pt voided prior to leaving hospital and once again on the way home from hospital. Pt has since been unable to urinate since around 12 noon today.

## 2019-03-01 NOTE — Anesthesia Procedure Notes (Signed)
Procedure Name: Intubation Date/Time: 03/01/2019 10:48 AM Performed by: Jonna Clark, CRNA Pre-anesthesia Checklist: Patient identified, Patient being monitored, Timeout performed, Emergency Drugs available and Suction available Patient Re-evaluated:Patient Re-evaluated prior to induction Oxygen Delivery Method: Circle system utilized Preoxygenation: Pre-oxygenation with 100% oxygen Induction Type: IV induction Ventilation: Mask ventilation without difficulty Laryngoscope Size: 3, McGraph and 4 Grade View: Grade I Tube type: Oral Tube size: 7.0 mm Number of attempts: 1 Airway Equipment and Method: Stylet Placement Confirmation: ETT inserted through vocal cords under direct vision,  positive ETCO2 and breath sounds checked- equal and bilateral Secured at: 21 cm Tube secured with: Tape Dental Injury: Teeth and Oropharynx as per pre-operative assessment  Comments: DVL and Intubation performed by Simeon Craft under supervision of Jonna Clark CRNA>

## 2019-03-01 NOTE — Discharge Summary (Signed)
Procedure: SCS removal Procedure date: 03/01/2019 Diagnosis: Chronic pain syndrome   History: AMIERE MOHER is s/p SCS removal  POD: Tolerated procedure well. He is recovering well. Denies any pain at this time. Denies new lower extremity pain/numbness/tingling.   Physical Exam: Vitals:   03/01/19 1505 03/01/19 1549  BP:  138/83  Pulse: 83 88  Resp: 20 18  Temp: 99.4 F (37.4 C)   SpO2: 97% 99%   Strength:5/5 throughout except 4-/4 right IS, Hamstring, PF (baseline) Sensation: intact and symmetric throughout lower extremities bilaterally.  Skin: Glue intact at incision sites   Data:  Recent Labs  Lab 02/24/19 1045  NA 140  K 3.5  CL 101  CO2 31  BUN 19  CREATININE 1.13  GLUCOSE 86  CALCIUM 9.6   No results for input(s): AST, ALT, ALKPHOS in the last 168 hours.  Invalid input(s): TBILI   Recent Labs  Lab 02/24/19 1045  WBC 5.7  HGB 14.4  HCT 44.2  PLT 220   Recent Labs  Lab 02/24/19 1045  APTT 28  INR 1.0         Other tests/results: No imaging reviewed  Assessment/Plan:  JAZZ GALE is POD0 s/p SCS removal. He is recovering well. Denies any pain at this time. Will continue post op pain control with muscle relaxer and pain medication as needed. He is scheduled to follow up in clinic in approximately 2 weeks to monitor progress. Advised to contact office if any questions or concerns arise before then.  Marin Olp PA-C Department of Neurosurgery

## 2019-03-01 NOTE — Anesthesia Preprocedure Evaluation (Signed)
Anesthesia Evaluation  Patient identified by MRN, date of birth, ID band Patient awake    Reviewed: Allergy & Precautions, H&P , NPO status , Patient's Chart, lab work & pertinent test results, reviewed documented beta blocker date and time   Airway Mallampati: III  TM Distance: >3 FB Neck ROM: full    Dental  (+) Teeth Intact   Pulmonary sleep apnea and Continuous Positive Airway Pressure Ventilation , former smoker,    Pulmonary exam normal        Cardiovascular Exercise Tolerance: Poor hypertension, On Medications negative cardio ROS Normal cardiovascular exam Rhythm:regular Rate:Normal     Neuro/Psych Chronic lower extr. Weakness reported.   Neuromuscular disease negative psych ROS   GI/Hepatic negative GI ROS, Neg liver ROS,   Endo/Other  negative endocrine ROS  Renal/GU negative Renal ROS  negative genitourinary   Musculoskeletal   Abdominal   Peds  Hematology negative hematology ROS (+)   Anesthesia Other Findings Past Medical History: No date: Arthritis No date: Chronic back pain 1999: Chronic pain     Comment:  Bilateral feet (R >L) No date: Difficult or painful urination No date: High blood pressure No date: History of kidney stones     Comment:  H/O No date: Nerve damage No date: Pain management No date: Paresthesia of foot, bilateral No date: Sleep apnea     Comment:  USES CPAP No date: Weakness of both legs Past Surgical History: No date: arm surgery No date: BACK SURGERY     Comment:  X2 No date: FACIAL COSMETIC SURGERY No date: HERNIA REPAIR 10/19/2017: IR US GUIDE BX ASP/DRAIN No date: KIDNEY STONE SURGERY No date: MASTECTOMY; Left BMI    Body Mass Index: 28.06 kg/m     Reproductive/Obstetrics negative OB ROS                             Anesthesia Physical Anesthesia Plan  ASA: III  Anesthesia Plan: General ETT   Post-op Pain Management:     Induction:   PONV Risk Score and Plan:   Airway Management Planned:   Additional Equipment:   Intra-op Plan:   Post-operative Plan:   Informed Consent: I have reviewed the patients History and Physical, chart, labs and discussed the procedure including the risks, benefits and alternatives for the proposed anesthesia with the patient or authorized representative who has indicated his/her understanding and acceptance.     Dental Advisory Given  Plan Discussed with: CRNA  Anesthesia Plan Comments:         Anesthesia Quick Evaluation

## 2019-03-01 NOTE — OR Nursing (Signed)
Removed implant:  [facility] # [id number] Dalphine Handing

## 2019-03-01 NOTE — Op Note (Signed)
Indications: the patient is a 69 yo male who was diagnosed with chronic pain in the past. He had a spinal cord stimulator placed, but it was nonfunctional. He presents today for removal of his spinal stimulator.    Findings: successful removal of complete spinal cord stimulator   Preoperative Diagnosis: chronic pain syndrome Postoperative Diagnosis: same     EBL: 50 ml IVF: 600 ml Drains: none Disposition: Extubated and Stable to PACU Complications: none   No foley catheter was placed.     Preoperative Note:    Risks of surgery discussed in clinic.   Operative Note:      The patient was then brought from the preoperative center with intravenous access established.  The patient underwent general anesthesia and endotracheal tube intubation, then was rotated on the [facility] table where all pressure points were appropriately padded.  The prior incisions were marked.  The skin was then thoroughly cleansed.  Perioperative antibiotic prophylaxis was administered.  Sterile prep and drapes were then applied and a timeout was then observed.     Once this was complete the incision on the left flank was opened and the leads identified.  The connectors were identified, then the leads going to the generator were marked and the leads divided.  A new incision lateral to the generator was made, then the soft tissues divided until the generator was palpated.  It was dissected free, then removed in entirety.    The midline incision was then opened.  The leads were identified, but were not mobile even after disconnecting the securing device.  The incision was extended superiorly and the posterior spinal muscles were reflected laterally. The T8 lamina was removed with the high speed drill, then the paddle lead dissected free.  It was then removed, and an xray taken to confirm that no contacts remained.    At this point, the incisions were irrigated, then closed in layers.  Dermabond was used on the  skin.   I performed the entire procedure with the assistance of Marin Olp PA as an Pensions consultant.     Meade Maw MD

## 2019-03-01 NOTE — Consult Note (Signed)
Pharmacy Antibiotic Note  Luke Foster is a 69 y.o. male admitted on 03/01/2019 with surgical prophylaxis.  Pharmacy has been consulted for Vancomycin dosing.  Plan: Vancomycin 1250mg  IV x 1  Start within 60 to 120 minutes prior to initial surgical incision. Vancomycin doses may be repeated intraoperatively in 2 half-lives (approximately 8 to 12 hours in patients with normal renal function) if procedure is lengthy or if there is excessive blood loss   No data recorded.  Recent Labs  Lab 02/24/19 1045  WBC 5.7  CREATININE 1.13    Estimated Creatinine Clearance: 68.5 mL/min (by C-G formula based on SCr of 1.13 mg/dL).    Allergies  Allergen Reactions  . Erythromycin Itching    Thank you for allowing pharmacy to be a part of this patient's care.  Lu Duffel, PharmD, BCPS Clinical Pharmacist 03/01/2019 9:43 AM

## 2019-03-01 NOTE — Discharge Instructions (Signed)

## 2019-03-02 NOTE — Anesthesia Postprocedure Evaluation (Signed)
Anesthesia Post Note  Patient: Luke Foster  Procedure(s) Performed: LUMBAR SPINAL CORD SIMULATOR LEAD REMOVAL (N/A ) LUMBAR SPINAL CORD STIMULATOR REMOVAL (N/A )  Patient location during evaluation: PACU Anesthesia Type: General Level of consciousness: awake and alert Pain management: pain level controlled Vital Signs Assessment: post-procedure vital signs reviewed and stable Respiratory status: spontaneous breathing, nonlabored ventilation, respiratory function stable and patient connected to nasal cannula oxygen Cardiovascular status: stable and blood pressure returned to baseline Postop Assessment: no apparent nausea or vomiting Anesthetic complications: no     Last Vitals:  Vitals:   03/01/19 1505 03/01/19 1549  BP:  138/83  Pulse: 83 88  Resp: 20 18  Temp: 37.4 C   SpO2: 97% 99%    Last Pain:  Vitals:   03/01/19 1549  TempSrc:   PainSc: 0-No pain                 Molli Barrows

## 2019-03-04 DIAGNOSIS — I10 Essential (primary) hypertension: Secondary | ICD-10-CM | POA: Diagnosis not present

## 2019-03-04 DIAGNOSIS — G629 Polyneuropathy, unspecified: Secondary | ICD-10-CM | POA: Diagnosis not present

## 2019-03-04 DIAGNOSIS — Z79891 Long term (current) use of opiate analgesic: Secondary | ICD-10-CM | POA: Diagnosis not present

## 2019-03-04 DIAGNOSIS — Z466 Encounter for fitting and adjustment of urinary device: Secondary | ICD-10-CM | POA: Diagnosis not present

## 2019-03-04 DIAGNOSIS — R339 Retention of urine, unspecified: Secondary | ICD-10-CM | POA: Diagnosis not present

## 2019-03-04 DIAGNOSIS — S61512D Laceration without foreign body of left wrist, subsequent encounter: Secondary | ICD-10-CM | POA: Diagnosis not present

## 2019-03-04 DIAGNOSIS — N411 Chronic prostatitis: Secondary | ICD-10-CM | POA: Diagnosis not present

## 2019-03-04 DIAGNOSIS — G894 Chronic pain syndrome: Secondary | ICD-10-CM | POA: Diagnosis not present

## 2019-03-04 DIAGNOSIS — T85890D Other specified complication of nervous system prosthetic devices, implants and grafts, subsequent encounter: Secondary | ICD-10-CM | POA: Diagnosis not present

## 2019-03-04 DIAGNOSIS — R202 Paresthesia of skin: Secondary | ICD-10-CM | POA: Diagnosis not present

## 2019-03-04 DIAGNOSIS — E785 Hyperlipidemia, unspecified: Secondary | ICD-10-CM | POA: Diagnosis not present

## 2019-03-04 DIAGNOSIS — M549 Dorsalgia, unspecified: Secondary | ICD-10-CM | POA: Diagnosis not present

## 2019-03-06 ENCOUNTER — Emergency Department (HOSPITAL_COMMUNITY): Payer: Medicare Other

## 2019-03-06 ENCOUNTER — Encounter (HOSPITAL_COMMUNITY): Payer: Self-pay | Admitting: *Deleted

## 2019-03-06 ENCOUNTER — Other Ambulatory Visit: Payer: Self-pay

## 2019-03-06 ENCOUNTER — Inpatient Hospital Stay (HOSPITAL_COMMUNITY)
Admission: EM | Admit: 2019-03-06 | Discharge: 2019-03-09 | DRG: 698 | Disposition: A | Discharge status: Skilled Nursing Facility | Payer: Medicare Other | Source: Skilled Nursing Facility | Attending: Internal Medicine | Admitting: Internal Medicine

## 2019-03-06 DIAGNOSIS — Z8249 Family history of ischemic heart disease and other diseases of the circulatory system: Secondary | ICD-10-CM

## 2019-03-06 DIAGNOSIS — M549 Dorsalgia, unspecified: Secondary | ICD-10-CM | POA: Diagnosis not present

## 2019-03-06 DIAGNOSIS — R000 Tachycardia, unspecified: Secondary | ICD-10-CM | POA: Diagnosis not present

## 2019-03-06 DIAGNOSIS — Z8349 Family history of other endocrine, nutritional and metabolic diseases: Secondary | ICD-10-CM

## 2019-03-06 DIAGNOSIS — N401 Enlarged prostate with lower urinary tract symptoms: Secondary | ICD-10-CM | POA: Diagnosis present

## 2019-03-06 DIAGNOSIS — G9341 Metabolic encephalopathy: Secondary | ICD-10-CM

## 2019-03-06 DIAGNOSIS — Z803 Family history of malignant neoplasm of breast: Secondary | ICD-10-CM | POA: Diagnosis not present

## 2019-03-06 DIAGNOSIS — Z743 Need for continuous supervision: Secondary | ICD-10-CM | POA: Diagnosis not present

## 2019-03-06 DIAGNOSIS — S3991XA Unspecified injury of abdomen, initial encounter: Secondary | ICD-10-CM | POA: Diagnosis not present

## 2019-03-06 DIAGNOSIS — M5416 Radiculopathy, lumbar region: Secondary | ICD-10-CM | POA: Diagnosis not present

## 2019-03-06 DIAGNOSIS — Y738 Miscellaneous gastroenterology and urology devices associated with adverse incidents, not elsewhere classified: Secondary | ICD-10-CM | POA: Diagnosis present

## 2019-03-06 DIAGNOSIS — Z79891 Long term (current) use of opiate analgesic: Secondary | ICD-10-CM | POA: Diagnosis not present

## 2019-03-06 DIAGNOSIS — Z466 Encounter for fitting and adjustment of urinary device: Secondary | ICD-10-CM | POA: Diagnosis not present

## 2019-03-06 DIAGNOSIS — G629 Polyneuropathy, unspecified: Secondary | ICD-10-CM | POA: Diagnosis not present

## 2019-03-06 DIAGNOSIS — R29898 Other symptoms and signs involving the musculoskeletal system: Secondary | ICD-10-CM | POA: Diagnosis not present

## 2019-03-06 DIAGNOSIS — N1330 Unspecified hydronephrosis: Secondary | ICD-10-CM | POA: Diagnosis present

## 2019-03-06 DIAGNOSIS — Z87891 Personal history of nicotine dependence: Secondary | ICD-10-CM | POA: Diagnosis not present

## 2019-03-06 DIAGNOSIS — M6281 Muscle weakness (generalized): Secondary | ICD-10-CM | POA: Diagnosis not present

## 2019-03-06 DIAGNOSIS — T85112D Breakdown (mechanical) of implanted electronic neurostimulator (electrode) of spinal cord, subsequent encounter: Secondary | ICD-10-CM | POA: Diagnosis not present

## 2019-03-06 DIAGNOSIS — E46 Unspecified protein-calorie malnutrition: Secondary | ICD-10-CM | POA: Diagnosis not present

## 2019-03-06 DIAGNOSIS — M1380 Other specified arthritis, unspecified site: Secondary | ICD-10-CM | POA: Diagnosis not present

## 2019-03-06 DIAGNOSIS — N411 Chronic prostatitis: Secondary | ICD-10-CM | POA: Diagnosis not present

## 2019-03-06 DIAGNOSIS — R202 Paresthesia of skin: Secondary | ICD-10-CM | POA: Diagnosis not present

## 2019-03-06 DIAGNOSIS — T83098A Other mechanical complication of other indwelling urethral catheter, initial encounter: Secondary | ICD-10-CM | POA: Diagnosis not present

## 2019-03-06 DIAGNOSIS — I10 Essential (primary) hypertension: Secondary | ICD-10-CM | POA: Diagnosis present

## 2019-03-06 DIAGNOSIS — S61512D Laceration without foreign body of left wrist, subsequent encounter: Secondary | ICD-10-CM | POA: Diagnosis not present

## 2019-03-06 DIAGNOSIS — T85890D Other specified complication of nervous system prosthetic devices, implants and grafts, subsequent encounter: Secondary | ICD-10-CM | POA: Diagnosis not present

## 2019-03-06 DIAGNOSIS — R339 Retention of urine, unspecified: Secondary | ICD-10-CM | POA: Diagnosis not present

## 2019-03-06 DIAGNOSIS — R338 Other retention of urine: Secondary | ICD-10-CM | POA: Diagnosis not present

## 2019-03-06 DIAGNOSIS — G4730 Sleep apnea, unspecified: Secondary | ICD-10-CM | POA: Diagnosis present

## 2019-03-06 DIAGNOSIS — Z20822 Contact with and (suspected) exposure to covid-19: Secondary | ICD-10-CM | POA: Diagnosis present

## 2019-03-06 DIAGNOSIS — G894 Chronic pain syndrome: Secondary | ICD-10-CM | POA: Diagnosis not present

## 2019-03-06 DIAGNOSIS — T83021A Displacement of indwelling urethral catheter, initial encounter: Principal | ICD-10-CM | POA: Diagnosis present

## 2019-03-06 DIAGNOSIS — K5900 Constipation, unspecified: Secondary | ICD-10-CM | POA: Diagnosis not present

## 2019-03-06 DIAGNOSIS — R4182 Altered mental status, unspecified: Secondary | ICD-10-CM | POA: Diagnosis present

## 2019-03-06 DIAGNOSIS — M21371 Foot drop, right foot: Secondary | ICD-10-CM | POA: Diagnosis present

## 2019-03-06 DIAGNOSIS — Z981 Arthrodesis status: Secondary | ICD-10-CM | POA: Diagnosis not present

## 2019-03-06 DIAGNOSIS — R0902 Hypoxemia: Secondary | ICD-10-CM | POA: Diagnosis not present

## 2019-03-06 DIAGNOSIS — Z881 Allergy status to other antibiotic agents status: Secondary | ICD-10-CM

## 2019-03-06 DIAGNOSIS — N320 Bladder-neck obstruction: Secondary | ICD-10-CM | POA: Diagnosis present

## 2019-03-06 DIAGNOSIS — G8929 Other chronic pain: Secondary | ICD-10-CM | POA: Diagnosis not present

## 2019-03-06 DIAGNOSIS — T839XXA Unspecified complication of genitourinary prosthetic device, implant and graft, initial encounter: Secondary | ICD-10-CM

## 2019-03-06 DIAGNOSIS — R52 Pain, unspecified: Secondary | ICD-10-CM | POA: Diagnosis not present

## 2019-03-06 DIAGNOSIS — Z7401 Bed confinement status: Secondary | ICD-10-CM | POA: Diagnosis not present

## 2019-03-06 DIAGNOSIS — R279 Unspecified lack of coordination: Secondary | ICD-10-CM | POA: Diagnosis not present

## 2019-03-06 DIAGNOSIS — M479 Spondylosis, unspecified: Secondary | ICD-10-CM | POA: Diagnosis not present

## 2019-03-06 DIAGNOSIS — E785 Hyperlipidemia, unspecified: Secondary | ICD-10-CM | POA: Diagnosis not present

## 2019-03-06 DIAGNOSIS — T83098S Other mechanical complication of other indwelling urethral catheter, sequela: Secondary | ICD-10-CM | POA: Diagnosis not present

## 2019-03-06 NOTE — ED Provider Notes (Signed)
[name]   CSN: [id number] Arrival date & time: 03/06/19  2211   Time seen 11:25 PM  History Chief Complaint  Patient presents with  . Urinary Retention    Luke Foster is a 69 y.o. male.  HPI   When I review patient's chart he was seen on January 20 half after having had a spinal stimulator removed and had acute urinary retention.  A Foley catheter was placed at that time.  He also related that time he had had to have a Foley multiple times before.  Today he tells me the Foley order was placed today and this is the first 1 he has ever had.  He appears to be uncomfortable.  Family told his nurse that he had not had a bowel movement in a couple days.  PCP [name], MD   Past Medical History:  Diagnosis Date  . Arthritis   . Chronic back pain   . Chronic pain 1999   Bilateral feet (R >L)  . Difficult or painful urination   . High blood pressure   . History of kidney stones    H/O  . Nerve damage   . Pain management   . Paresthesia of foot, bilateral   . Sleep apnea    USES CPAP  . Weakness of both legs     Patient Active Problem List   Diagnosis Date Noted  . Bilateral hydronephrosis 03/07/2019  . Altered mental status 03/07/2019  . Hypertension   . Neuropathy 01/21/2017  . Pain in right foot 01/21/2017  . Pain in left foot 01/21/2017  . Chronic pain syndrome 01/21/2017  . Post laminectomy syndrome 01/21/2017  . Status post insertion of spinal cord stimulator 01/21/2017  . Uncomplicated opioid dependence ([place]) 01/21/2017  . Lumbosacral radiculopathy 12/09/2012    Past Surgical History:  Procedure Laterality Date  . arm surgery    . BACK SURGERY     X2  . FACIAL COSMETIC SURGERY    . HERNIA REPAIR    . IR US GUIDE BX ASP/DRAIN  10/19/2017  . KIDNEY STONE SURGERY    . LUMBAR SPINAL CORD SIMULATOR LEAD REMOVAL N/A 03/01/2019   Procedure: LUMBAR SPINAL CORD SIMULATOR LEAD REMOVAL;  Surgeon: Meade Maw, MD;   Location: [facility] ORS;  Service: Neurosurgery;  Laterality: N/A;  . MASTECTOMY Left   . SPINAL CORD STIMULATOR REMOVAL N/A 03/01/2019   Procedure: LUMBAR SPINAL CORD STIMULATOR REMOVAL;  Surgeon: Meade Maw, MD;  Location: [facility] ORS;  Service: Neurosurgery;  Laterality: N/A;       Family History  Problem Relation Age of Onset  . Heart disease Father        Living, 39  . Breast cancer Mother        Living, 79  . Hypercholesterolemia Mother   . Healthy Brother   . Healthy Sister     Social History   Tobacco Use  . Smoking status: Former Smoker    Packs/day: 2.00    Years: 10.00    Pack years: 20.00    Types: Cigarettes    Quit date: 05/21/1991    Years since quitting: 27.8  . Smokeless tobacco: Former Systems developer    Quit date: 06/08/1991  Substance Use Topics  . Alcohol use: No  . Drug use: No  lives at home  Home Medications Prior to Admission medications   Medication Sig Start Date End Date Taking? Authorizing Provider  cyclobenzaprine (FLEXERIL) 10 MG tablet Take 10 mg  by mouth 3 (three) times daily as needed for muscle spasms.   Yes [provider]  diazepam (VALIUM) 10 MG tablet Take 10 mg by mouth 2 (two) times daily as needed (restless legs).  09/29/18  Yes [provider]  diltiazem (CARDIZEM CD) 120 MG 24 hr capsule Take 120 mg by mouth every evening.  09/26/18  Yes [provider]  enalapril (VASOTEC) 5 MG tablet Take 5 mg by mouth 2 (two) times daily.  05/20/16  Yes [provider]  ibuprofen (ADVIL,MOTRIN) 200 MG tablet Take 800 mg by mouth every 8 (eight) hours as needed for moderate pain.   Yes [provider]  lidocaine (LIDODERM) 5 % Place 1 patch onto the skin daily as needed (pain.). Remove & Discard patch within 12 hours or as directed by MD    Yes [provider]  oxyCODONE (ROXICODONE) 15 MG immediate release tablet Take 15 mg by mouth every 4 (four) hours.  11/02/18  Yes [provider]  oxyCODONE  (ROXICODONE) 5 MG immediate release tablet Take 1 tablet (5 mg total) by mouth every 4 (four) hours as needed for up to 5 days for breakthrough pain. 03/01/19 03/07/19 Yes Marin Olp, PA-C  tamsulosin (FLOMAX) 0.4 MG CAPS capsule Take 0.4 mg by mouth every evening.    Yes [provider]  venlafaxine XR (EFFEXOR-XR) 75 MG 24 hr capsule Take 75 mg by mouth 2 (two) times daily. 12/31/18  Yes [provider]    Allergies    Erythromycin  Review of Systems   Review of Systems  Unable to perform ROS: Other    Physical Exam Updated Vital Signs BP (!) 135/107   Pulse 80   Temp 99.5 F (37.5 C) (Rectal)   Resp (!) 22   Ht 5\' 8"  (1.727 m)   Wt 90.7 kg   SpO2 97%   BMI 30.41 kg/m   Physical Exam Vitals and nursing note reviewed.  Constitutional:      Appearance: Normal appearance.     Comments: Seems to be distressed  HENT:     Head: Normocephalic and atraumatic.     Nose: Nose normal.  Eyes:     Extraocular Movements: Extraocular movements intact.     Conjunctiva/sclera: Conjunctivae normal.     Pupils: Pupils are equal, round, and reactive to light.  Cardiovascular:     Rate and Rhythm: Tachycardia present.  Pulmonary:     Effort: Pulmonary effort is normal. No respiratory distress.  Abdominal:     General: Abdomen is flat.     Palpations: Abdomen is soft.     Tenderness: There is abdominal tenderness in the suprapubic area. There is no guarding or rebound.  Musculoskeletal:        General: Normal range of motion.     Cervical back: Normal range of motion.  Skin:    General: Skin is warm and dry.  Neurological:     Mental Status: He is alert.  Psychiatric:        Mood and Affect: Mood is anxious.        Speech: Speech is delayed.        Behavior: Behavior is agitated.     ED Results / Procedures / Treatments   Labs (all labs ordered are listed, but only abnormal results are displayed) Results for orders placed or performed during the hospital  encounter of 03/06/19  CBC with Differential  Result Value Ref Range   WBC 7.4 4.0 - 10.5  K/uL   RBC 4.91 4.22 - 5.81 MIL/uL   Hemoglobin 14.1 13.0 - 17.0 g/dL   HCT 43.2 39.0 - 52.0 %   MCV 88.0 80.0 - 100.0 fL   MCH 28.7 26.0 - 34.0 pg   MCHC 32.6 30.0 - 36.0 g/dL   RDW 11.5 11.5 - 15.5 %   Platelets 266 150 - 400 K/uL   nRBC 0.0 0.0 - 0.2 %   Neutrophils Relative % 78 %   Neutro Abs 5.8 1.7 - 7.7 K/uL   Lymphocytes Relative 13 %   Lymphs Abs 0.9 0.7 - 4.0 K/uL   Monocytes Relative 7 %   Monocytes Absolute 0.6 0.1 - 1.0 K/uL   Eosinophils Relative 1 %   Eosinophils Absolute 0.1 0.0 - 0.5 K/uL   Basophils Relative 1 %   Basophils Absolute 0.1 0.0 - 0.1 K/uL   Immature Granulocytes 0 %   Abs Immature Granulocytes 0.02 0.00 - 0.07 K/uL  Comprehensive metabolic panel  Result Value Ref Range   Sodium 137 135 - 145 mmol/L   Potassium 3.7 3.5 - 5.1 mmol/L   Chloride 100 98 - 111 mmol/L   CO2 27 22 - 32 mmol/L   Glucose, Bld 125 (H) 70 - 99 mg/dL   BUN 20 8 - 23 mg/dL   Creatinine, Ser 1.02 0.61 - 1.24 mg/dL   Calcium 9.3 8.9 - 10.3 mg/dL   Total Protein 7.2 6.5 - 8.1 g/dL   Albumin 3.5 3.5 - 5.0 g/dL   AST 35 15 - 41 U/L   ALT 27 0 - 44 U/L   Alkaline Phosphatase 81 38 - 126 U/L   Total Bilirubin 0.8 0.3 - 1.2 mg/dL   GFR calc non Af Amer >60 >60 mL/min   GFR calc Af Amer >60 >60 mL/min   Anion gap 10 5 - 15  Urinalysis, Routine w reflex microscopic  Result Value Ref Range   Color, Urine YELLOW YELLOW   APPearance HAZY (A) CLEAR   Specific Gravity, Urine 1.010 1.005 - 1.030   pH 7.0 5.0 - 8.0   Glucose, UA NEGATIVE NEGATIVE mg/dL   Hgb urine dipstick LARGE (A) NEGATIVE   Bilirubin Urine NEGATIVE NEGATIVE   Ketones, ur NEGATIVE NEGATIVE mg/dL   Protein, ur 30 (A) NEGATIVE mg/dL   Nitrite NEGATIVE NEGATIVE   Leukocytes,Ua NEGATIVE NEGATIVE   RBC / HPF >50 (H) 0 - 5 RBC/hpf   Bacteria, UA NONE SEEN NONE SEEN   Mucus PRESENT   CK  Result Value Ref Range   Total CK  207 49 - 397 U/L  Magnesium  Result Value Ref Range   Magnesium 2.0 1.7 - 2.4 mg/dL  Phosphorus  Result Value Ref Range   Phosphorus 3.3 2.5 - 4.6 mg/dL     Laboratory interpretation all normal except hematuria   EKG None  Radiology DG Abdomen 1 View  Result Date: 03/07/2019 CLINICAL DATA:  Constipation EXAM: ABDOMEN - 1 VIEW COMPARISON:  CT renal colic [id number] FINDINGS: No high-grade obstructive bowel gas pattern is seen. No large stool burden. No suspicious calcifications overlying the course of either ureter, renal shadow or gallbladder fossa. Postsurgical changes from prior L4-S1 posterior spinal fusion without evidence of acute hardware complication. Multilevel degenerative changes present in the spine. Redemonstrated T12 and L1 compression deformities, incompletely assessed on frontal only view. Remaining osseous structures are unremarkable. Included lung bases are clear. Cardiomediastinal contours are free of acute Abner IMPRESSION: 1. No high-grade obstructive bowel gas pattern. No  large stool burden. No suspicious calcifications. 2. Redemonstrated endplate deformities of T12 and L1, incompletely assessed in the absence of lateral radiograph. Correlate for point tenderness and if present consider dedicated imaging. 3. Postsurgical changes from prior L4-S1 posterior spinal fusion. Electronically Signed   By: Lovena Le M.D.   On: 03/07/2019 00:11   CT Head Wo Contrast  Result Date: 03/07/2019 CLINICAL DATA:  Delirium, altered mental status. EXAM: CT HEAD WITHOUT CONTRAST TECHNIQUE: Contiguous axial images were obtained from the base of the skull through the vertex without intravenous contrast. COMPARISON:  01/18/2013 FINDINGS: Brain: There is atrophy and chronic small vessel disease changes. No acute intracranial abnormality. Specifically, no hemorrhage, hydrocephalus, mass lesion, acute infarction, or significant intracranial injury. Vascular: No hyperdense vessel or unexpected  calcification. Skull: No acute calvarial abnormality. Sinuses/Orbits: Visualized paranasal sinuses and mastoids clear. Orbital soft tissues unremarkable. Other: None IMPRESSION: Atrophy, chronic microvascular disease. No acute intracranial abnormality. Electronically Signed   By: Rolm Baptise M.D.   On: 03/07/2019 03:19   CT Abdomen Pelvis W Contrast  Result Date: 03/07/2019 CLINICAL DATA:  Abdominal distension, minimal urinary catheter output, fall from chair EXAM: CT ABDOMEN AND PELVIS WITH CONTRAST TECHNIQUE: Multidetector CT imaging of the abdomen and pelvis was performed using the standard protocol following bolus administration of intravenous contrast. CONTRAST:  157mL OMNIPAQUE IOHEXOL 300 MG/ML  SOLN COMPARISON:  CT 08/15/2017 FINDINGS: Lower chest: Atelectatic changes noted in the lung bases. Normal heart size. No pericardial effusion. Atherosclerotic calcification of the coronary arteries. Hepatobiliary: No focal liver abnormality is seen. No gallstones, gallbladder wall thickening, or biliary dilatation. Pancreas: Fatty replacement of the pancreas. No pancreatic ductal dilatation or surrounding inflammatory changes. Spleen: Normal in size without focal abnormality. Adrenals/Urinary Tract: Mild nodular thickening of the adrenal glands is similar to the prior without concerning dominant nodule. There is a 9.8 cm fluid attenuation cyst involving the lower pole right kidney. A smaller 2.6 cm fluid attenuation cyst is seen arising from the lower pole left kidney as well. Additional subcentimeter hypoattenuating foci in both kidneys are too small to fully characterize. There are several nonobstructing collecting system calculi in the lower pole right kidney. There is mild left and moderate right hydroureteronephrosis to the level of the circumferentially thickened bladder without visible obstructing urolithiasis. The urinary bladder appears circumferentially thickened with perivesicular hazy stranding and  some mucosal hyperemia. There is indentation of the posterior bladder base by the enlarged prostate. An inflated Foley catheter is positioned within the prostate itself. Stomach/Bowel: Distal esophagus, stomach and duodenal sweep are unremarkable. No small bowel wall thickening or dilatation. No evidence of obstruction. A normal appendix is visualized. No colonic dilatation or wall thickening. Scattered colonic diverticula without focal pericolonic inflammation to suggest diverticulitis. Vascular/Lymphatic: Extensive atherosclerotic calcification of the aorta and branch vessels. No aneurysm or ectasia. No pathologically enlarged abdominopelvic lymph nodes. Reproductive: The inflated Foley catheter balloon is positioned within the central prostate. There is a partially rim enhancing hypoattenuating collection to the left of the Foley catheter balloon worrisome for developing prostate abscess measuring 2.1 by 1.0 cm in size. Other: Perivesicular inflammatory changes, as detailed above. No free fluid or free air in the abdomen or pelvis. No bowel containing hernias. Mild body wall edema. Postsurgical changes of the posterior midline soft tissues. Small focus of gas in the subcutaneous tissues of the left anterolateral abdominal wall may reflect removal of a neurostimulator device seen on comparison CT. Correlate with surgical history. Musculoskeletal: There is been prior L5-S1 posterior  spinal fusion as well as postsurgical decompression of the T8 level. Stable compression deformities of T12 and L1. No acute or suspicious osseous lesions are seen. IMPRESSION: 1. Inflated Foley catheter balloon is positioned within the prostate with a small hypoattenuating rim enhancing collection in the adjacent prostate tissue worrisome for developing prostate abscess. 2. Circumferentially thickened urinary bladder with perivesicular hazy stranding and mucosal hyperemia. Findings are concerning for cystitis as well. 3. There is mild  left and moderate right hydroureteronephrosis to the level of the circumferentially thickened bladder without visible obstructing urolithiasis. Findings could reflect distension secondary to outlet obstruction or severe cystitis. 4. Soft tissue gas in the left anterolateral abdominal wall and edematous changes in the posterior soft tissues of the left flank may be related to removal of a previously seen at neurostimulator battery pack device. Correlate with surgical history. 5. Colonic diverticulosis without evidence of diverticulitis. 6. Coronary artery calcifications. 7. Unchanged compression deformities at T12 and L1. No acute osseous injury. These results were called by telephone at the time of interpretation on 03/07/2019 at 3:29 am to provider Daymeon Fischman , who verbally acknowledged these results. Electronically Signed   By: Lovena Le M.D.   On: 03/07/2019 03:30    Procedures Procedures (including critical care time)  Medications Ordered in ED Medications  labetalol (NORMODYNE) injection 20 mg (20 mg Intravenous Not Given 03/07/19 0218)  acetaminophen (TYLENOL) tablet 650 mg (has no administration in time range)    Or  acetaminophen (TYLENOL) suppository 650 mg (has no administration in time range)  cefTRIAXone (ROCEPHIN) 1 g in sodium chloride 0.9 % 100 mL IVPB (has no administration in time range)  sodium chloride 0.9 % bolus 1,000 mL (0 mLs Intravenous Stopped 03/07/19 0201)  sodium chloride 0.9 % bolus 500 mL (0 mLs Intravenous Stopped 03/07/19 0202)  iohexol (OMNIPAQUE) 300 MG/ML solution 100 mL (100 mLs Intravenous Contrast Given 03/07/19 0253)  cefTRIAXone (ROCEPHIN) 1 g in sodium chloride 0.9 % 100 mL IVPB (0 g Intravenous Stopped 03/07/19 0542)  metoprolol tartrate (LOPRESSOR) injection 5 mg (5 mg Intravenous Given 03/07/19 0450)    ED Course  I have reviewed the triage vital signs and the nursing notes.  Pertinent labs & imaging results that were available during my care of the  patient were reviewed by me and considered in my medical decision making (see chart for details).    MDM Rules/Calculators/A&P                       Nurse reports his bladder scan is 0.  Family also said he had had a bowel movement a few days, and a abdominal x-ray was done without acute findings.  We did the bladder scan again and it is still 0.  There is no urine in his tube or bag.  Patient seems to be confused.  When I went back in 12:15 AM he states he had an appointment with his urologist on Monday however today had been Monday.  He just seems overly confused, nurses said he did not appear that way when he first came in.  I am wondering if patient may be is dehydrated, IV was inserted and laboratory testing was done.  12:50 AM I just finished talking to the patient's POA, his brother 77.  Phone number [phone].  He states patient had surgery done on the 20th at [facility] outpatient by Dr. Elayne Guerin from [place].  They removed a stimulator that was pressing on a nerve  on his right foot.  Patient has had a right foot drop and pain for years.  He uses a cane and a walker for ambulation.  He states that patient fell on 21 May had to have EMS come help pick him up.  On the 22nd he seemed better after falling on Friday earlier in the day.  His brother thinks maybe he took an extra Valium today.  The brother states about 1 PM he left for about an hour and when he came back at 2 PM he felt the patient seemed disoriented.  He had that the physical therapist to come today.  They had been over how to say get up and down from sitting.  He has an appointment tomorrow, January 26 with urology.  He states he has been drinking but not eating.  Patient is noted to have significant tachycardia and hypertension.  Labetalol IV was ordered.  Patient was given 1000 cc bolus of normal saline.  When rechecked at 2:00 AM his heart rate is now 92 his blood pressure is 153/85 and he has put out 600 cc of  very dark urine.  Patient seems to be feeling better.  He states his mouth was dry, he is willing to try to eat or drink.  The IV labetalol was held.  2:35 AM radiologist called about patient's CT result.  3:30 AM through 3:42 AM patient was discussed with Dr. Jeffie Pollock, urologist.  He has reviewed his CT scan.  He wants the present Foley to be removed and replaced with a #16 or 18-gauge coud.  He states to give Rocephin IV and if he is discharged oral Septra will be good, he states Dr. Diona Fanti will be in the office today and patient's brother states he already has an appointment to see the urologist today.  He does not feel like he has an actual prostate abscess but possibly of a hematoma or obstruction of the prostate ducts from the balloon being in the prostate.   4:15 AM Dr. Olevia Bowens will admit patient to observation.  Final Clinical Impression(s) / ED Diagnoses Final diagnoses:  Weakness of both lower limbs  Foley catheter problem, initial encounter [facility])    Rx / [place] admission  Rolland Porter, MD, Barbette Or, MD 03/07/19 [phone]

## 2019-03-06 NOTE — ED Triage Notes (Signed)
Pt brought in by [facility] for c/o minimal output of urinary catheter; pt c/o abdominal pain; ems reports pt fell out of his chair x 4 hours ago and they came and assisted him back to the chair

## 2019-03-07 ENCOUNTER — Emergency Department (HOSPITAL_COMMUNITY): Payer: Medicare Other

## 2019-03-07 ENCOUNTER — Ambulatory Visit: Payer: Medicare Other | Admitting: Urology

## 2019-03-07 DIAGNOSIS — R4182 Altered mental status, unspecified: Secondary | ICD-10-CM | POA: Diagnosis present

## 2019-03-07 DIAGNOSIS — I10 Essential (primary) hypertension: Secondary | ICD-10-CM | POA: Diagnosis present

## 2019-03-07 DIAGNOSIS — R338 Other retention of urine: Secondary | ICD-10-CM

## 2019-03-07 DIAGNOSIS — N1330 Unspecified hydronephrosis: Secondary | ICD-10-CM | POA: Diagnosis present

## 2019-03-07 DIAGNOSIS — N401 Enlarged prostate with lower urinary tract symptoms: Secondary | ICD-10-CM

## 2019-03-07 LAB — COMPREHENSIVE METABOLIC PANEL
ALT: 27 U/L (ref 0–44)
AST: 35 U/L (ref 15–41)
Albumin: 3.5 g/dL (ref 3.5–5.0)
Alkaline Phosphatase: 81 U/L (ref 38–126)
Anion gap: 10 (ref 5–15)
BUN: 20 mg/dL (ref 8–23)
CO2: 27 mmol/L (ref 22–32)
Calcium: 9.3 mg/dL (ref 8.9–10.3)
Chloride: 100 mmol/L (ref 98–111)
Creatinine, Ser: 1.02 mg/dL (ref 0.61–1.24)
GFR calc Af Amer: >60 mL/min (ref >60)
GFR calc non Af Amer: >60 mL/min (ref >60)
Glucose, Bld: 125 mg/dL — ABNORMAL HIGH (ref 70–99)
Potassium: 3.7 mmol/L (ref 3.5–5.1)
Sodium: 137 mmol/L (ref 135–145)
Total Bilirubin: 0.8 mg/dL (ref 0.3–1.2)
Total Protein: 7.2 g/dL (ref 6.5–8.1)

## 2019-03-07 LAB — URINALYSIS, ROUTINE W REFLEX MICROSCOPIC
Bacteria, UA: NONE SEEN
Bilirubin Urine: NEGATIVE
Glucose, UA: NEGATIVE mg/dL
Ketones, ur: NEGATIVE mg/dL
Leukocytes,Ua: NEGATIVE
Nitrite: NEGATIVE
Protein, ur: 30 mg/dL — AB
RBC / HPF: >50 RBC/hpf — ABNORMAL HIGH (ref 0–5)
Specific Gravity, Urine: 1.01 (ref 1.005–1.030)
pH: 7 (ref 5.0–8.0)

## 2019-03-07 LAB — CBC WITH DIFFERENTIAL/PLATELET
Abs Immature Granulocytes: 0.02 10*3/uL (ref 0.00–0.07)
Basophils Absolute: 0.1 10*3/uL (ref 0.0–0.1)
Basophils Relative: 1 %
Eosinophils Absolute: 0.1 10*3/uL (ref 0.0–0.5)
Eosinophils Relative: 1 %
HCT: 43.2 % (ref 39.0–52.0)
Hemoglobin: 14.1 g/dL (ref 13.0–17.0)
Immature Granulocytes: 0 %
Lymphocytes Relative: 13 %
Lymphs Abs: 0.9 10*3/uL (ref 0.7–4.0)
MCH: 28.7 pg (ref 26.0–34.0)
MCHC: 32.6 g/dL (ref 30.0–36.0)
MCV: 88 fL (ref 80.0–100.0)
Monocytes Absolute: 0.6 10*3/uL (ref 0.1–1.0)
Monocytes Relative: 7 %
Neutro Abs: 5.8 10*3/uL (ref 1.7–7.7)
Neutrophils Relative %: 78 %
Platelets: 266 10*3/uL (ref 150–400)
RBC: 4.91 MIL/uL (ref 4.22–5.81)
RDW: 11.5 % (ref 11.5–15.5)
WBC: 7.4 10*3/uL (ref 4.0–10.5)
nRBC: 0 % (ref 0.0–0.2)

## 2019-03-07 LAB — PHOSPHORUS: Phosphorus: 3.3 mg/dL (ref 2.5–4.6)

## 2019-03-07 LAB — SARS CORONAVIRUS 2 (TAT 6-24 HRS): SARS Coronavirus 2: NEGATIVE

## 2019-03-07 LAB — MAGNESIUM: Magnesium: 2 mg/dL (ref 1.7–2.4)

## 2019-03-07 LAB — CK: Total CK: 207 U/L (ref 49–397)

## 2019-03-07 MED ORDER — ACETAMINOPHEN 325 MG PO TABS
650.0000 mg | ORAL_TABLET | Freq: Four times a day (QID) | ORAL | Status: DC | PRN
  Administered 2019-03-08 – 2019-03-09 (×3): 650 mg via ORAL
  Filled 2019-03-07 (×3): qty 2

## 2019-03-07 MED ORDER — METOPROLOL TARTRATE 5 MG/5ML IV SOLN
5.0000 mg | Freq: Once | INTRAVENOUS | Status: AC
  Administered 2019-03-07: 05:00:00 5 mg via INTRAVENOUS
  Filled 2019-03-07: qty 5

## 2019-03-07 MED ORDER — SODIUM CHLORIDE 0.9 % IV BOLUS
500.0000 mL | Freq: Once | INTRAVENOUS | Status: AC
  Administered 2019-03-07: 500 mL via INTRAVENOUS

## 2019-03-07 MED ORDER — SODIUM CHLORIDE 0.9 % IV BOLUS
1000.0000 mL | Freq: Once | INTRAVENOUS | Status: AC
  Administered 2019-03-07: 1000 mL via INTRAVENOUS

## 2019-03-07 MED ORDER — TAMSULOSIN HCL 0.4 MG PO CAPS
0.4000 mg | ORAL_CAPSULE | Freq: Every day | ORAL | Status: DC
  Administered 2019-03-07 – 2019-03-08 (×2): 0.4 mg via ORAL
  Filled 2019-03-07 (×3): qty 1

## 2019-03-07 MED ORDER — SODIUM CHLORIDE 0.9 % IV SOLN
1.0000 g | INTRAVENOUS | Status: DC
  Administered 2019-03-08 – 2019-03-09 (×2): 1 g via INTRAVENOUS
  Filled 2019-03-07 (×2): qty 10

## 2019-03-07 MED ORDER — OXYCODONE HCL 5 MG PO TABS
5.0000 mg | ORAL_TABLET | Freq: Four times a day (QID) | ORAL | Status: AC | PRN
  Administered 2019-03-07 – 2019-03-08 (×2): 5 mg via ORAL
  Filled 2019-03-07 (×2): qty 1

## 2019-03-07 MED ORDER — LIDOCAINE 5 % EX PTCH
1.0000 | MEDICATED_PATCH | CUTANEOUS | Status: DC
  Administered 2019-03-07 – 2019-03-08 (×2): 1 via TRANSDERMAL
  Filled 2019-03-07 (×3): qty 1

## 2019-03-07 MED ORDER — TRAMADOL HCL 50 MG PO TABS
50.0000 mg | ORAL_TABLET | Freq: Once | ORAL | Status: AC | PRN
  Administered 2019-03-08: 50 mg via ORAL
  Filled 2019-03-07: qty 1

## 2019-03-07 MED ORDER — VENLAFAXINE HCL ER 75 MG PO CP24
75.0000 mg | ORAL_CAPSULE | Freq: Every day | ORAL | Status: DC
  Administered 2019-03-07 – 2019-03-08 (×2): 75 mg via ORAL
  Filled 2019-03-07 (×2): qty 1

## 2019-03-07 MED ORDER — ACETAMINOPHEN 650 MG RE SUPP: 650.0000 mg | Freq: Four times a day (QID) | RECTAL | Status: DC | PRN

## 2019-03-07 MED ORDER — SODIUM CHLORIDE 0.9 % IV SOLN
1.0000 g | Freq: Once | INTRAVENOUS | Status: AC
  Administered 2019-03-07: 1 g via INTRAVENOUS
  Filled 2019-03-07: qty 10

## 2019-03-07 MED ORDER — LABETALOL HCL 5 MG/ML IV SOLN: 20.0000 mg | Freq: Once | INTRAVENOUS | Status: DC

## 2019-03-07 MED ORDER — CHLORHEXIDINE GLUCONATE CLOTH 2 % EX PADS
6.0000 | MEDICATED_PAD | Freq: Every day | CUTANEOUS | Status: DC
  Administered 2019-03-08 – 2019-03-09 (×2): 6 via TOPICAL

## 2019-03-07 MED ORDER — IOHEXOL 300 MG/ML  SOLN
100.0000 mL | Freq: Once | INTRAMUSCULAR | Status: AC | PRN
  Administered 2019-03-07: 100 mL via INTRAVENOUS

## 2019-03-07 NOTE — ED Notes (Signed)
Pt's brother is his POA and he should be bringing papers stating such tomorrow. His name is Oswell Brackin and he can be reached at [phone].

## 2019-03-07 NOTE — Consult Note (Signed)
Urology Consult  Consulting MD: Tennis Must  CC: Bladder outlet obstruction  HPI: This is a 69year old male previously seen in our office for BPH and a large right renal cyst, presenting with urinary retention, recurrent.  The patient recently had surgery to remove the spinal stimulator.  Following this, because of BPH and his surgery he developed urinary retention and a Foley catheter was placed.  The patient presented to the emergency department here at [facility] last night with improper catheter drainage for several days.  CT scan of the abdomen and pelvis revealed bilateral hydronephrosis down to the bladder, large right renal cyst, large prostate and the catheter balloon being inflated within the prostatic urethra.  It was recommended that the catheter be replaced, but this has not been done.  Instead, the catheter was repositioned with the balloon being deflated, the catheter being advanced into the bladder and the balloon was reinflated.  Since then, the catheter has been draining well.  The patient has had no evident creatinine elevation, with his most recent creatinine being under 1.1.  1 week ago, it was 1.13.  He is on tamsulosin.  He sees Dr. Gloriann Loan in our [place] office.  His brother, Laverna Peace, is at his bedside and is his spokesperson.  PMH: Past Medical History:  Diagnosis Date  . Arthritis   . Chronic back pain   . Chronic pain 1999   Bilateral feet (R >L)  . Difficult or painful urination   . High blood pressure   . History of kidney stones    H/O  . Nerve damage   . Pain management   . Paresthesia of foot, bilateral   . Sleep apnea    USES CPAP  . Weakness of both legs     PSH: Past Surgical History:  Procedure Laterality Date  . arm surgery    . BACK SURGERY     X2  . FACIAL COSMETIC SURGERY    . HERNIA REPAIR    . IR US GUIDE BX ASP/DRAIN  10/19/2017  . KIDNEY STONE SURGERY    . LUMBAR SPINAL CORD SIMULATOR LEAD REMOVAL N/A 03/01/2019   Procedure:  LUMBAR SPINAL CORD SIMULATOR LEAD REMOVAL;  Surgeon: Meade Maw, MD;  Location: [facility] ORS;  Service: Neurosurgery;  Laterality: N/A;  . MASTECTOMY Left   . SPINAL CORD STIMULATOR REMOVAL N/A 03/01/2019   Procedure: LUMBAR SPINAL CORD STIMULATOR REMOVAL;  Surgeon: Meade Maw, MD;  Location: [facility] ORS;  Service: Neurosurgery;  Laterality: N/A;    Allergies: Allergies  Allergen Reactions  . Erythromycin Itching    Medications: Medications Prior to Admission  Medication Sig Dispense Refill Last Dose  . cyclobenzaprine (FLEXERIL) 10 MG tablet Take 10 mg by mouth 3 (three) times daily as needed for muscle spasms.   03/07/2019 at Unknown time  . diazepam (VALIUM) 10 MG tablet Take 10 mg by mouth 2 (two) times daily as needed (restless legs).    03/07/2019 at Unknown time  . diltiazem (CARDIZEM CD) 120 MG 24 hr capsule Take 120 mg by mouth every evening.    03/07/2019 at Unknown time  . enalapril (VASOTEC) 5 MG tablet Take 5 mg by mouth 2 (two) times daily.    03/07/2019 at Unknown time  . ibuprofen (ADVIL,MOTRIN) 200 MG tablet Take 800 mg by mouth every 8 (eight) hours as needed for moderate pain.     Marland Kitchen lidocaine (LIDODERM) 5 % Place 1 patch onto the skin daily as needed (pain.). Remove & Discard  patch within 12 hours or as directed by MD      . oxyCODONE (ROXICODONE) 15 MG immediate release tablet Take 15 mg by mouth every 4 (four) hours.      Marland Kitchen oxyCODONE (ROXICODONE) 5 MG immediate release tablet Take 1 tablet (5 mg total) by mouth every 4 (four) hours as needed for up to 5 days for breakthrough pain. 30 tablet 0   . tamsulosin (FLOMAX) 0.4 MG CAPS capsule Take 0.4 mg by mouth every evening.      . venlafaxine XR (EFFEXOR-XR) 75 MG 24 hr capsule Take 75 mg by mouth 2 (two) times daily.        Social History: Social History   Socioeconomic History  . Marital status: Single    Spouse name: Not on file  . Number of children: 0  . Years of education: Not on file  . Highest  education level: High school graduate  Occupational History  . Not on file  Tobacco Use  . Smoking status: Former Smoker    Packs/day: 2.00    Years: 10.00    Pack years: 20.00    Types: Cigarettes    Quit date: 05/21/1991    Years since quitting: 27.8  . Smokeless tobacco: Former Systems developer    Quit date: 06/08/1991  Substance and Sexual Activity  . Alcohol use: No  . Drug use: No  . Sexual activity: Not on file  Other Topics Concern  . Not on file  Social History Narrative   Lives with wife.   He is on disability since 2011 due to chronic back pain.      Caffeine - one mountain dew in the morning    Social Determinants of Health   Financial Resource Strain:   . Difficulty of Paying Living Expenses: Not on file  Food Insecurity:   . Worried About Charity fundraiser in the Last Year: Not on file  . Ran Out of Food in the Last Year: Not on file  Transportation Needs:   . Lack of Transportation (Medical): Not on file  . Lack of Transportation (Non-Medical): Not on file  Physical Activity:   . Days of Exercise per Week: Not on file  . Minutes of Exercise per Session: Not on file  Stress:   . Feeling of Stress : Not on file  Social Connections:   . Frequency of Communication with Friends and Family: Not on file  . Frequency of Social Gatherings with Friends and Family: Not on file  . Attends Religious Services: Not on file  . Active Member of Clubs or Organizations: Not on file  . Attends Archivist Meetings: Not on file  . Marital Status: Not on file  Intimate Partner Violence:   . Fear of Current or Ex-Partner: Not on file  . Emotionally Abused: Not on file  . Physically Abused: Not on file  . Sexually Abused: Not on file    Family History: Family History  Problem Relation Age of Onset  . Heart disease Father        Living, 71  . Breast cancer Mother        Living, 69  . Hypercholesterolemia Mother   . Healthy Brother   . Healthy Sister     Review of  Systems: Positive: Urinary retention, weakness Negative:   A further 10 point review of systems was negative except what is listed in the HPI.  Physical Exam: @VITALS2 @ General: No acute distress.  Awake.  He  is responsive. Head:  Normocephalic.  Atraumatic. ENT:  EOMI.  Mucous membranes moist Neck:  Supple.  No lymphadenopathy. CV:  Regular rate. Pulmonary: Equal effort bilaterally.   Skin:  Normal turgor.  No visible rash. Extremity: No gross deformity of upper extremities.  No gross deformity of lower extremities. Neurologic: Alert. Appropriate mood.  Penis:  circumcised.  No lesions. Urethra: Orthotopic meatus.  Catheter present. Scrotum: No lesions.  No ecchymosis.  No erythema. Testicles: Descended bilaterally.  No masses bilaterally. Epididymis: Palpable bilaterally.  Non Tender to palpation.  Studies:  Recent Labs    03/07/19 0044  HGB 14.1  WBC 7.4  PLT 266    Recent Labs    03/07/19 0044  NA 137  K 3.7  CL 100  CO2 27  BUN 20  CREATININE 1.02  CALCIUM 9.3  GFRNONAA >60  GFRAA >60     No results for input(s): INR, APTT in the last 72 hours.  Invalid input(s): PT   Invalid input(s): ABG    Assessment:   1.  BPH with retention.  More than likely, the patient pulled on his catheter displacing the balloon into the prostatic urethra.  The catheter has been repositioned and is draining adequately.  I did place an extra 10 cc of water in the balloon of the catheter so that it will not be able to be pulled out  2.  Bilateral hydroureteronephrosis, most likely secondary to catheter malposition.  This needs to be followed, however  3.  Abnormal findings on CT regarding the prostate-read as possible abscess.  This may well be secondary to catheter related trauma, however  Plan: I would suggest repeating renal ultrasound within the next week or so to follow-up the hydronephrosis  Leave catheter in until he is more ambulatory and stronger  We will call  to set up a follow-up appointment for him to see [place] in our [place] office    Pager:[phone]

## 2019-03-07 NOTE — ED Notes (Signed)
Foley repositioned as instructed. Balloon deflated with 10cc syringe, foley advanced (slowly and with some difficulty) to the end of the catheter, and balloon re-inflated with 10cc of Normal Saline. Pt tolerated well. Dr Tomi Bamberger notified that this was completed and will use bedside ultrasound to confirm placement past prostate.

## 2019-03-07 NOTE — Care Management Obs Status (Signed)
[facility] NOTIFICATION   Patient Details  Name: Luke Foster MRN: [id number] Date of Birth: [birth year]   Medicare Observation Status Notification Given:  Other (see comment)(copy placed at bedside for brother, Luke Foster to pick up during visitation)    [name] Medal 03/07/2019, 3:51 PM

## 2019-03-07 NOTE — H&P (Signed)
History and Physical    Luke Foster [id number] DOB: 1951-01-08 DOA: 03/06/2019  PCP: [name], MD   Patient coming from: Home.  I have personally briefly reviewed patient's old medical records in [place]  Chief Complaint: Decreased urination.  HPI: Luke Foster is a 69 y.o. male with medical history significant of osteoarthritis, chronic back pain, hypertension, lithiasis, lumbar radiculopathy, lumbar spine stimulator placement history with recent removal of this device 5 days ago followed by urinary retention and Foley catheter placement who is coming to the emergency department due to minimal urinary output from his catheter.  He has also been constipated for the past 2 to 3 days.  EMS also reported in triage that the patient fell at his chair about 4 hours before arriving to the ED on an earlier call and the EMS crew assisted him back to his chair.  He is unable to provide further history at this time.  He is afebrile.  He answers simple questions and denies headache, chest pain or abdominal pain at this time.  ED Course: The patient received 1500 mL of NS bolus and 1 g of ceftriaxone IVPB.  Urology was contacted by Dr. Tomi Bamberger and they will be evaluating the patient in consult later today.  Urinalysis show large hemoglobinuria with proteinuria 30 mg/dL, but is otherwise normal.  CBC was normal.  CMP shows a glucose of 125 mg/dL, but is otherwise normal.  Total CK, magnesium and phosphorus are normal.  Review of Systems: As per HPI otherwise 10 point review of systems negative.   Past Medical History:  Diagnosis Date  . Arthritis   . Chronic back pain   . Chronic pain 1999   Bilateral feet (R >L)  . Difficult or painful urination   . High blood pressure   . History of kidney stones    H/O  . Nerve damage   . Pain management   . Paresthesia of foot, bilateral   . Sleep apnea    USES CPAP  . Weakness of both legs     Past Surgical History:  Procedure  Laterality Date  . arm surgery    . BACK SURGERY     X2  . FACIAL COSMETIC SURGERY    . HERNIA REPAIR    . IR US GUIDE BX ASP/DRAIN  10/19/2017  . KIDNEY STONE SURGERY    . LUMBAR SPINAL CORD SIMULATOR LEAD REMOVAL N/A 03/01/2019   Procedure: LUMBAR SPINAL CORD SIMULATOR LEAD REMOVAL;  Surgeon: Meade Maw, MD;  Location: [facility] ORS;  Service: Neurosurgery;  Laterality: N/A;  . MASTECTOMY Left   . SPINAL CORD STIMULATOR REMOVAL N/A 03/01/2019   Procedure: LUMBAR SPINAL CORD STIMULATOR REMOVAL;  Surgeon: Meade Maw, MD;  Location: [facility] ORS;  Service: Neurosurgery;  Laterality: N/A;     reports that he quit smoking about 27 years ago. His smoking use included cigarettes. He has a 20.00 pack-year smoking history. He quit smokeless tobacco use about 27 years ago. He reports that he does not drink alcohol or use drugs.  Allergies  Allergen Reactions  . Erythromycin Itching    Family History  Problem Relation Age of Onset  . Heart disease Father        Living, 42  . Breast cancer Mother        Living, 51  . Hypercholesterolemia Mother   . Healthy Brother   . Healthy Sister    Prior to Admission medications   Medication Sig Start Date End  Date Taking? Authorizing Provider  cyclobenzaprine (FLEXERIL) 10 MG tablet Take 10 mg by mouth 3 (three) times daily as needed for muscle spasms.   Yes [provider]  diazepam (VALIUM) 10 MG tablet Take 10 mg by mouth 2 (two) times daily as needed (restless legs).  09/29/18  Yes [provider]  diltiazem (CARDIZEM CD) 120 MG 24 hr capsule Take 120 mg by mouth every evening.  09/26/18  Yes [provider]  enalapril (VASOTEC) 5 MG tablet Take 5 mg by mouth 2 (two) times daily.  05/20/16  Yes [provider]  ibuprofen (ADVIL,MOTRIN) 200 MG tablet Take 800 mg by mouth every 8 (eight) hours as needed for moderate pain.   Yes [provider]  lidocaine (LIDODERM) 5 % Place 1 patch onto the skin daily  as needed (pain.). Remove & Discard patch within 12 hours or as directed by MD    Yes [provider]  oxyCODONE (ROXICODONE) 15 MG immediate release tablet Take 15 mg by mouth every 4 (four) hours.  11/02/18  Yes [provider]  oxyCODONE (ROXICODONE) 5 MG immediate release tablet Take 1 tablet (5 mg total) by mouth every 4 (four) hours as needed for up to 5 days for breakthrough pain. 03/01/19 03/07/19 Yes Marin Olp, PA-C  tamsulosin (FLOMAX) 0.4 MG CAPS capsule Take 0.4 mg by mouth every evening.    Yes [provider]  venlafaxine XR (EFFEXOR-XR) 75 MG 24 hr capsule Take 75 mg by mouth 2 (two) times daily. 12/31/18  Yes [provider]    Physical Exam: Vitals:   03/07/19 0400 03/07/19 0415 03/07/19 0430 03/07/19 0500  BP: (!) 163/98  (!) 150/96 (!) 157/91  Pulse: 92 85  80  Resp:    (!) 25  Temp:      TempSrc:      SpO2: 97% 96%  98%  Weight:      Height:        Constitutional: NAD, calm, comfortable Eyes: PERRL, lids and conjunctivae normal ENMT: Mucous membranes are moist. Posterior pharynx clear of any exudate or lesions. Neck: normal, supple, no masses, no thyromegaly Respiratory: clear to auscultation bilaterally, no wheezing, no crackles. Normal respiratory effort. No accessory muscle use.  Cardiovascular: Regular rate and rhythm, no murmurs / rubs / gallops. No extremity edema. 2+ pedal pulses. No carotid bruits.  Abdomen: Nondistended.  Soft, no tenderness, no masses palpated. No hepatosplenomegaly. Bowel sounds positive.  Musculoskeletal: no clubbing / cyanosis. Good ROM, no contractures. Normal muscle tone.  Skin: no rashes, lesions, ulcers on limited dermatological examination. Neurologic: CN 2-12 grossly intact.  Decreased sensation on lower extremities., DTR normal.  Generalized weakness. Psychiatric: Alert, oriented x2, partially oriented to time/date and situation.  Labs on Admission: I have personally reviewed following labs  and imaging studies  CBC: Recent Labs  Lab 03/07/19 0044  WBC 7.4  NEUTROABS 5.8  HGB 14.1  HCT 43.2  MCV 88.0  PLT 123456   Basic Metabolic Panel: Recent Labs  Lab 03/07/19 0044  NA 137  K 3.7  CL 100  CO2 27  GLUCOSE 125*  BUN 20  CREATININE 1.02  CALCIUM 9.3   GFR: Estimated Creatinine Clearance: 76.8 mL/min (by C-G formula based on SCr of 1.02 mg/dL). Liver Function Tests: Recent Labs  Lab 03/07/19 0044  AST 35  ALT 27  ALKPHOS 81  BILITOT 0.8  PROT 7.2  ALBUMIN 3.5   No results for input(s): LIPASE, AMYLASE in the last 168  hours. No results for input(s): AMMONIA in the last 168 hours. Coagulation Profile: No results for input(s): INR, PROTIME in the last 168 hours. Cardiac Enzymes: Recent Labs  Lab 03/07/19 0044  CKTOTAL 207   BNP (last 3 results) No results for input(s): PROBNP in the last 8760 hours. HbA1C: No results for input(s): HGBA1C in the last 72 hours. CBG: No results for input(s): GLUCAP in the last 168 hours. Lipid Profile: No results for input(s): CHOL, HDL, LDLCALC, TRIG, CHOLHDL, LDLDIRECT in the last 72 hours. Thyroid Function Tests: No results for input(s): TSH, T4TOTAL, FREET4, T3FREE, THYROIDAB in the last 72 hours. Anemia Panel: No results for input(s): VITAMINB12, FOLATE, FERRITIN, TIBC, IRON, RETICCTPCT in the last 72 hours. Urine analysis:    Component Value Date/Time   COLORURINE YELLOW 03/07/2019 0211   APPEARANCEUR HAZY (A) 03/07/2019 0211   LABSPEC 1.010 03/07/2019 0211   PHURINE 7.0 03/07/2019 0211   GLUCOSEU NEGATIVE 03/07/2019 0211   HGBUR LARGE (A) 03/07/2019 0211   BILIRUBINUR NEGATIVE 03/07/2019 0211   KETONESUR NEGATIVE 03/07/2019 0211   PROTEINUR 30 (A) 03/07/2019 0211   UROBILINOGEN 1.0 08/16/2009 1138   NITRITE NEGATIVE 03/07/2019 0211   LEUKOCYTESUR NEGATIVE 03/07/2019 0211    Radiological Exams on Admission: DG Abdomen 1 View  Result Date: 03/07/2019 CLINICAL DATA:  Constipation EXAM: ABDOMEN - 1  VIEW COMPARISON:  CT renal colic [id number] FINDINGS: No high-grade obstructive bowel gas pattern is seen. No large stool burden. No suspicious calcifications overlying the course of either ureter, renal shadow or gallbladder fossa. Postsurgical changes from prior L4-S1 posterior spinal fusion without evidence of acute hardware complication. Multilevel degenerative changes present in the spine. Redemonstrated T12 and L1 compression deformities, incompletely assessed on frontal only view. Remaining osseous structures are unremarkable. Included lung bases are clear. Cardiomediastinal contours are free of acute Abner IMPRESSION: 1. No high-grade obstructive bowel gas pattern. No large stool burden. No suspicious calcifications. 2. Redemonstrated endplate deformities of T12 and L1, incompletely assessed in the absence of lateral radiograph. Correlate for point tenderness and if present consider dedicated imaging. 3. Postsurgical changes from prior L4-S1 posterior spinal fusion. Electronically Signed   By: Lovena Le M.D.   On: 03/07/2019 00:11   CT Head Wo Contrast  Result Date: 03/07/2019 CLINICAL DATA:  Delirium, altered mental status. EXAM: CT HEAD WITHOUT CONTRAST TECHNIQUE: Contiguous axial images were obtained from the base of the skull through the vertex without intravenous contrast. COMPARISON:  01/18/2013 FINDINGS: Brain: There is atrophy and chronic small vessel disease changes. No acute intracranial abnormality. Specifically, no hemorrhage, hydrocephalus, mass lesion, acute infarction, or significant intracranial injury. Vascular: No hyperdense vessel or unexpected calcification. Skull: No acute calvarial abnormality. Sinuses/Orbits: Visualized paranasal sinuses and mastoids clear. Orbital soft tissues unremarkable. Other: None IMPRESSION: Atrophy, chronic microvascular disease. No acute intracranial abnormality. Electronically Signed   By: Rolm Baptise M.D.   On: 03/07/2019 03:19   CT Abdomen Pelvis  W Contrast  Result Date: 03/07/2019 CLINICAL DATA:  Abdominal distension, minimal urinary catheter output, fall from chair EXAM: CT ABDOMEN AND PELVIS WITH CONTRAST TECHNIQUE: Multidetector CT imaging of the abdomen and pelvis was performed using the standard protocol following bolus administration of intravenous contrast. CONTRAST:  164mL OMNIPAQUE IOHEXOL 300 MG/ML  SOLN COMPARISON:  CT 08/15/2017 FINDINGS: Lower chest: Atelectatic changes noted in the lung bases. Normal heart size. No pericardial effusion. Atherosclerotic calcification of the coronary arteries. Hepatobiliary: No focal liver abnormality is seen. No gallstones, gallbladder wall thickening, or biliary dilatation. Pancreas:  Fatty replacement of the pancreas. No pancreatic ductal dilatation or surrounding inflammatory changes. Spleen: Normal in size without focal abnormality. Adrenals/Urinary Tract: Mild nodular thickening of the adrenal glands is similar to the prior without concerning dominant nodule. There is a 9.8 cm fluid attenuation cyst involving the lower pole right kidney. A smaller 2.6 cm fluid attenuation cyst is seen arising from the lower pole left kidney as well. Additional subcentimeter hypoattenuating foci in both kidneys are too small to fully characterize. There are several nonobstructing collecting system calculi in the lower pole right kidney. There is mild left and moderate right hydroureteronephrosis to the level of the circumferentially thickened bladder without visible obstructing urolithiasis. The urinary bladder appears circumferentially thickened with perivesicular hazy stranding and some mucosal hyperemia. There is indentation of the posterior bladder base by the enlarged prostate. An inflated Foley catheter is positioned within the prostate itself. Stomach/Bowel: Distal esophagus, stomach and duodenal sweep are unremarkable. No small bowel wall thickening or dilatation. No evidence of obstruction. A normal appendix is  visualized. No colonic dilatation or wall thickening. Scattered colonic diverticula without focal pericolonic inflammation to suggest diverticulitis. Vascular/Lymphatic: Extensive atherosclerotic calcification of the aorta and branch vessels. No aneurysm or ectasia. No pathologically enlarged abdominopelvic lymph nodes. Reproductive: The inflated Foley catheter balloon is positioned within the central prostate. There is a partially rim enhancing hypoattenuating collection to the left of the Foley catheter balloon worrisome for developing prostate abscess measuring 2.1 by 1.0 cm in size. Other: Perivesicular inflammatory changes, as detailed above. No free fluid or free air in the abdomen or pelvis. No bowel containing hernias. Mild body wall edema. Postsurgical changes of the posterior midline soft tissues. Small focus of gas in the subcutaneous tissues of the left anterolateral abdominal wall may reflect removal of a neurostimulator device seen on comparison CT. Correlate with surgical history. Musculoskeletal: There is been prior L5-S1 posterior spinal fusion as well as postsurgical decompression of the T8 level. Stable compression deformities of T12 and L1. No acute or suspicious osseous lesions are seen. IMPRESSION: 1. Inflated Foley catheter balloon is positioned within the prostate with a small hypoattenuating rim enhancing collection in the adjacent prostate tissue worrisome for developing prostate abscess. 2. Circumferentially thickened urinary bladder with perivesicular hazy stranding and mucosal hyperemia. Findings are concerning for cystitis as well. 3. There is mild left and moderate right hydroureteronephrosis to the level of the circumferentially thickened bladder without visible obstructing urolithiasis. Findings could reflect distension secondary to outlet obstruction or severe cystitis. 4. Soft tissue gas in the left anterolateral abdominal wall and edematous changes in the posterior soft tissues of  the left flank may be related to removal of a previously seen at neurostimulator battery pack device. Correlate with surgical history. 5. Colonic diverticulosis without evidence of diverticulitis. 6. Coronary artery calcifications. 7. Unchanged compression deformities at T12 and L1. No acute osseous injury. These results were called by telephone at the time of interpretation on 03/07/2019 at 3:29 am to provider IVA KNAPP , who verbally acknowledged these results. Electronically Signed   By: Lovena Le M.D.   On: 03/07/2019 03:30    EKG: Independently reviewed.   Assessment/Plan Principal Problem:   Bilateral hydronephrosis Observation/telemetry. Continue Foley catheter drainage. Continue IVF. Monitor intake and output. Continue tamsulosin 0.4 mg nightly. Urology will evaluate later today.  Active Problems:   Chronic pain syndrome Hold diazepam and cyclobenzaprine. Hold oxycodone for now. Monitor mental status before resuming.    Altered mental status Partially disoriented to date.  Continue IV antibiotics and IV fluids.    Hypertension Hold Vasotec. Continue Cardizem 120 mg p.o. daily.   DVT prophylaxis: SCDs. Code Status: Full code.  Family Communication:  Disposition Plan: Observation for IV antibiotic and urology evaluation Consults called: Routine neurology consult. Admission status: Observation/telemetry.   Reubin Milan MD [facility]  If 7PM-7AM, please contact night-coverage [email or web address]  03/07/2019, 5:29 AM   This document was prepared using Dragon voice recognition software and may contain some unintended transcription errors.

## 2019-03-07 NOTE — Progress Notes (Signed)
In for admission screenings. Pt found at the end of the bed, had stripped his gown and linens off the bed, with legs hanging off the end of the bed and flat on his back. Pt states he was trying to sit up in bed. Pt found to be confused to time and situation. Oriented to self. Pt repositioned in bed, dressed and provided clean linens. Pt foley noted to leak around catheter insertion site. Stat lock pulling on catheter due to malpositioning. Catheter repositioned and secured in Stat lock loosely, leaking resolved at present time. Pt with c/o back pain, but no other concerns at present.

## 2019-03-07 NOTE — ED Notes (Signed)
Pt given a cup of Jakia [employer]nebrew Ale to drink.

## 2019-03-07 NOTE — Progress Notes (Signed)
Patient seen and examined.  Admitted after midnight secondary to decrease in urination, fall/deconditioning and findings suggesting cystitis/prostatitis with bilateral hydronephrosis.  Please refer to H&P written by Dr. Olevia Bowens on 03/07/2019 for further info/details.  Plan: -Continue IV antibiotics -Follow urology service recommendations -Follow culture results and sensitivity -Continue the use of flomax and effexor -follow renal function and clinical response.   Barton Dubois [phone]

## 2019-03-08 DIAGNOSIS — M479 Spondylosis, unspecified: Secondary | ICD-10-CM | POA: Diagnosis present

## 2019-03-08 DIAGNOSIS — G629 Polyneuropathy, unspecified: Secondary | ICD-10-CM | POA: Diagnosis present

## 2019-03-08 DIAGNOSIS — I10 Essential (primary) hypertension: Secondary | ICD-10-CM | POA: Diagnosis present

## 2019-03-08 DIAGNOSIS — N401 Enlarged prostate with lower urinary tract symptoms: Secondary | ICD-10-CM | POA: Diagnosis present

## 2019-03-08 DIAGNOSIS — Y738 Miscellaneous gastroenterology and urology devices associated with adverse incidents, not elsewhere classified: Secondary | ICD-10-CM | POA: Diagnosis present

## 2019-03-08 DIAGNOSIS — Z803 Family history of malignant neoplasm of breast: Secondary | ICD-10-CM | POA: Diagnosis not present

## 2019-03-08 DIAGNOSIS — Z20822 Contact with and (suspected) exposure to covid-19: Secondary | ICD-10-CM | POA: Diagnosis present

## 2019-03-08 DIAGNOSIS — Z87891 Personal history of nicotine dependence: Secondary | ICD-10-CM | POA: Diagnosis not present

## 2019-03-08 DIAGNOSIS — G9341 Metabolic encephalopathy: Secondary | ICD-10-CM | POA: Diagnosis present

## 2019-03-08 DIAGNOSIS — N1330 Unspecified hydronephrosis: Secondary | ICD-10-CM | POA: Diagnosis present

## 2019-03-08 DIAGNOSIS — T83021A Displacement of indwelling urethral catheter, initial encounter: Secondary | ICD-10-CM | POA: Diagnosis present

## 2019-03-08 DIAGNOSIS — Z8249 Family history of ischemic heart disease and other diseases of the circulatory system: Secondary | ICD-10-CM | POA: Diagnosis not present

## 2019-03-08 DIAGNOSIS — M21371 Foot drop, right foot: Secondary | ICD-10-CM | POA: Diagnosis present

## 2019-03-08 DIAGNOSIS — M5416 Radiculopathy, lumbar region: Secondary | ICD-10-CM | POA: Diagnosis present

## 2019-03-08 DIAGNOSIS — Z981 Arthrodesis status: Secondary | ICD-10-CM | POA: Diagnosis not present

## 2019-03-08 DIAGNOSIS — R338 Other retention of urine: Secondary | ICD-10-CM | POA: Diagnosis present

## 2019-03-08 DIAGNOSIS — G894 Chronic pain syndrome: Secondary | ICD-10-CM | POA: Diagnosis present

## 2019-03-08 DIAGNOSIS — Z8349 Family history of other endocrine, nutritional and metabolic diseases: Secondary | ICD-10-CM | POA: Diagnosis not present

## 2019-03-08 DIAGNOSIS — Z881 Allergy status to other antibiotic agents status: Secondary | ICD-10-CM | POA: Diagnosis not present

## 2019-03-08 DIAGNOSIS — N320 Bladder-neck obstruction: Secondary | ICD-10-CM | POA: Diagnosis present

## 2019-03-08 DIAGNOSIS — G4730 Sleep apnea, unspecified: Secondary | ICD-10-CM | POA: Diagnosis present

## 2019-03-08 LAB — CBC
HCT: 42.9 % (ref 39.0–52.0)
Hemoglobin: 13.8 g/dL (ref 13.0–17.0)
MCH: 28.8 pg (ref 26.0–34.0)
MCHC: 32.2 g/dL (ref 30.0–36.0)
MCV: 89.6 fL (ref 80.0–100.0)
Platelets: 261 10*3/uL (ref 150–400)
RBC: 4.79 MIL/uL (ref 4.22–5.81)
RDW: 11.5 % (ref 11.5–15.5)
WBC: 8.5 10*3/uL (ref 4.0–10.5)
nRBC: 0 % (ref 0.0–0.2)

## 2019-03-08 LAB — COMPREHENSIVE METABOLIC PANEL
ALT: 23 U/L (ref 0–44)
AST: 29 U/L (ref 15–41)
Albumin: 3.5 g/dL (ref 3.5–5.0)
Alkaline Phosphatase: 74 U/L (ref 38–126)
Anion gap: 12 (ref 5–15)
BUN: 18 mg/dL (ref 8–23)
CO2: 25 mmol/L (ref 22–32)
Calcium: 9.8 mg/dL (ref 8.9–10.3)
Chloride: 102 mmol/L (ref 98–111)
Creatinine, Ser: 0.96 mg/dL (ref 0.61–1.24)
GFR calc Af Amer: >60 mL/min (ref >60)
GFR calc non Af Amer: >60 mL/min (ref >60)
Glucose, Bld: 99 mg/dL (ref 70–99)
Potassium: 3.8 mmol/L (ref 3.5–5.1)
Sodium: 139 mmol/L (ref 135–145)
Total Bilirubin: 0.8 mg/dL (ref 0.3–1.2)
Total Protein: 7.3 g/dL (ref 6.5–8.1)

## 2019-03-08 LAB — GLUCOSE, CAPILLARY: Glucose-Capillary: 89 mg/dL (ref 70–99)

## 2019-03-08 LAB — URINE CULTURE
Culture: NO GROWTH
Special Requests: NORMAL

## 2019-03-08 LAB — HIV ANTIBODY (ROUTINE TESTING W REFLEX): HIV Screen 4th Generation wRfx: NONREACTIVE

## 2019-03-08 MED ORDER — DILTIAZEM HCL ER COATED BEADS 120 MG PO CP24
120.0000 mg | ORAL_CAPSULE | Freq: Every day | ORAL | Status: DC
  Administered 2019-03-08 – 2019-03-09 (×2): 120 mg via ORAL
  Filled 2019-03-08 (×2): qty 1

## 2019-03-08 MED ORDER — OXYBUTYNIN CHLORIDE 5 MG PO TABS
5.0000 mg | ORAL_TABLET | Freq: Three times a day (TID) | ORAL | Status: DC
  Administered 2019-03-08 – 2019-03-09 (×3): 5 mg via ORAL
  Filled 2019-03-08 (×3): qty 1

## 2019-03-08 NOTE — Progress Notes (Signed)
PROGRESS NOTE  SANAT WADLINGTON [id number] DOB: April 30, 1950 DOA: 03/06/2019 PCP: [name], MD  Brief History:   69 year old male with a history of osteoarthritis, chronic back pain, Hypertension, Depression presenting with decreased urine output from his foley catheter.  The patient had his spinal stimulator removed on 03/01/2019  (by Dr. Elayne Guerin from [facility]) after which he had acute urinary retention.  Foley catheter was placed at that time.  In addition, the patient also had some increasing confusion.  They removed a stimulator that was pressing on a nerve on his right foot.  Patient has had a right foot drop and pain for years.  He uses a cane and a walker for ambulation.  Urology was consulted.  Dr. Diona Fanti saw the patient.  He deflated the balloon and repositioned the Foley catheter into the urinary bladder, and the balloon was reinflated in the bladder with good draining urine.  The patient's renal function remained stable.  He remained afebrile and hemodynamically stable.  Assessment/Plan: Bilateral hydronephrosis -Likely secondary to Foley catheter malposition -Foley catheter has been repositioned by urology -However, the patient has had significant urine draining around his Foley catheter today -continue tamulosin  BPH with urinary retention -03/07/2019 CT abdomen pelvis--inflated Foley balloon positioned within the prostatic urethra with hypoattenuating enhancing collection adjacent to the prostatic tissue--urology did not feel this was abscess, more likely hematoma vs obstruction of prostatic ducts -CT abdomen also shows circumferential urinary bladder thickening with perivesical hazy stranding and mucosal hyperemia concerning for cystitis; there was bilateral hydronephrosis -Continue tamsulosin  Foley Catheter dysfunction -pt now with significant leakage around foley -reconsulting urology--discussed with Dr. Joseph Art bladder spasm--start  ditropan trial  Chronic pain syndrome -Continue home dose oxycodone -Holding diazepam and cyclobenzaprine  Acute metabolic encephalopathy -Multifactorial including possible UTI, urinary retention, opioids -pt remains confused  Essential hypertension -Holding enalapril -Restart diltiazem CD      Disposition Plan:   SNF in 1-2 days  Family Communication:   Brother updated on phone  Consultants:  urology  Code Status:  FULL   DVT Prophylaxis:  SCDs   Procedures: As Listed in Progress Note Above  Antibiotics: Ceftriaxone 03/06/19    Total time spent 35 minutes.  Greater than 50% spent face to face counseling and coordinating care.    Subjective: Patient denies fevers, chills, headache, chest pain, dyspnea, nausea, vomiting, diarrhea, abdominal pain, dysuria, hematuria, hematochezia, and melena. He has some suprapubic pain.    Objective: Vitals:   03/07/19 1122 03/07/19 2008 03/08/19 0501 03/08/19 1320  BP: (!) 143/89 (!) 169/92 (!) 163/95 (!) 156/97  Pulse: 83 90 76 (!) 105  Resp: 17 19 16 18   Temp: 98.1 F (36.7 C) 98.8 F (37.1 C) 99.1 F (37.3 C) 99.6 F (37.6 C)  TempSrc: Oral Oral Oral Oral  SpO2: 97% 100% 100% 98%  Weight: 86.5 kg     Height: 5\' 8"  (1.727 m)       Intake/Output Summary (Last 24 hours) at 03/08/2019 1440 Last data filed at 03/08/2019 1300 Gross per 24 hour  Intake 820 ml  Output 1200 ml  Net -380 ml   Weight change: -4.219 kg Exam:   General:  Pt is alert, follows commands appropriately, not in acute distress  HEENT: No icterus, No thrush, No neck mass, [place]/AT  Cardiovascular: RRR, S1/S2, no rubs, no gallops  Respiratory: CTA bilaterally, no wheezing, no crackles, no rhonchi  Abdomen: Soft/+BS, non tender, non distended, no  guarding  Extremities: No edema, No lymphangitis, No petechiae, No rashes, no synovitis   Data Reviewed: I have personally reviewed following labs and imaging studies Basic Metabolic  Panel: Recent Labs  Lab 03/07/19 0044 03/08/19 0524  NA 137 139  K 3.7 3.8  CL 100 102  CO2 27 25  GLUCOSE 125* 99  BUN 20 18  CREATININE 1.02 0.96  CALCIUM 9.3 9.8  MG 2.0  --   PHOS 3.3  --    Liver Function Tests: Recent Labs  Lab 03/07/19 0044 03/08/19 0524  AST 35 29  ALT 27 23  ALKPHOS 81 74  BILITOT 0.8 0.8  PROT 7.2 7.3  ALBUMIN 3.5 3.5   No results for input(s): LIPASE, AMYLASE in the last 168 hours. No results for input(s): AMMONIA in the last 168 hours. Coagulation Profile: No results for input(s): INR, PROTIME in the last 168 hours. CBC: Recent Labs  Lab 03/07/19 0044 03/08/19 0524  WBC 7.4 8.5  NEUTROABS 5.8  --   HGB 14.1 13.8  HCT 43.2 42.9  MCV 88.0 89.6  PLT 266 261   Cardiac Enzymes: Recent Labs  Lab 03/07/19 0044  CKTOTAL 207   BNP: Invalid input(s): POCBNP CBG: Recent Labs  Lab 03/08/19 0517  GLUCAP 89   HbA1C: No results for input(s): HGBA1C in the last 72 hours. Urine analysis:    Component Value Date/Time   COLORURINE YELLOW 03/07/2019 0211   APPEARANCEUR HAZY (A) 03/07/2019 0211   LABSPEC 1.010 03/07/2019 0211   PHURINE 7.0 03/07/2019 0211   GLUCOSEU NEGATIVE 03/07/2019 0211   HGBUR LARGE (A) 03/07/2019 0211   BILIRUBINUR NEGATIVE 03/07/2019 0211   KETONESUR NEGATIVE 03/07/2019 0211   PROTEINUR 30 (A) 03/07/2019 0211   UROBILINOGEN 1.0 08/16/2009 1138   NITRITE NEGATIVE 03/07/2019 0211   LEUKOCYTESUR NEGATIVE 03/07/2019 0211   Sepsis Labs: @LABRCNTIP (procalcitonin:4,lacticidven:4) ) Recent Results (from the past 240 hour(s))  SARS CORONAVIRUS 2 (Merdis Snodgrass 6-24 HRS) Nasopharyngeal Nasopharyngeal Swab     Status: None   Collection Time: 02/27/19 10:00 AM   Specimen: Nasopharyngeal Swab  Result Value Ref Range Status   SARS Coronavirus 2 NEGATIVE NEGATIVE Final    Comment: (NOTE) SARS-CoV-2 target nucleic acids are NOT DETECTED. The SARS-CoV-2 RNA is generally detectable in upper and lower respiratory specimens  during the acute phase of infection. Negative results do not preclude SARS-CoV-2 infection, do not rule out co-infections with other pathogens, and should not be used as the sole basis for treatment or other patient management decisions. Negative results must be combined with clinical observations, patient history, and epidemiological information. The expected result is Negative. Fact Sheet for Patients: SugarRoll.be Fact Sheet for Healthcare Providers: https://www.woods-mathews.com/ This test is not yet approved or cleared by the [place] FDA and  has been authorized for detection and/or diagnosis of SARS-CoV-2 by FDA under an Emergency Use Authorization (EUA). This EUA will remain  in effect (meaning this test can be used) for the duration of the COVID-19 declaration under Section 56 4(b)(1) of the Act, 21 U.S.C. section 360bbb-3(b)(1), unless the authorization is terminated or revoked sooner. Performed at [facility], [place] [address], [place], [place] [address]   SARS CORONAVIRUS 2 (Yaileen Hofferber 6-24 HRS) Nasopharyngeal Nasopharyngeal Swab     Status: None   Collection Time: 03/07/19  1:34 AM   Specimen: Nasopharyngeal Swab  Result Value Ref Range Status   SARS Coronavirus 2 NEGATIVE NEGATIVE Final    Comment: (NOTE) SARS-CoV-2 target nucleic acids are NOT  DETECTED. The SARS-CoV-2 RNA is generally detectable in upper and lower respiratory specimens during the acute phase of infection. Negative results do not preclude SARS-CoV-2 infection, do not rule out co-infections with other pathogens, and should not be used as the sole basis for treatment or other patient management decisions. Negative results must be combined with clinical observations, patient history, and epidemiological information. The expected result is Negative. Fact Sheet for Patients: SugarRoll.be Fact Sheet for Healthcare  Providers: https://www.woods-mathews.com/ This test is not yet approved or cleared by the [place] FDA and  has been authorized for detection and/or diagnosis of SARS-CoV-2 by FDA under an Emergency Use Authorization (EUA). This EUA will remain  in effect (meaning this test can be used) for the duration of the COVID-19 declaration under Section 56 4(b)(1) of the Act, 21 U.S.C. section 360bbb-3(b)(1), unless the authorization is terminated or revoked sooner. Performed at [facility], [place] [address] [place], [place] [address]   Urine culture     Status: None   Collection Time: 03/07/19  2:04 AM   Specimen: Urine, Catheterized  Result Value Ref Range Status   Specimen Description   Final    URINE, CATHETERIZED Performed at [facility], [address], [place], [place] [address]    Special Requests   Final    Normal Performed at [facility], [address] [place], [place] [address]    Culture   Final    NO GROWTH Performed at [facility], [place] [address], [place], [place] [address]    Report Status 03/08/2019 FINAL  Final     Scheduled Meds: . Chlorhexidine Gluconate Cloth  6 each Topical Daily  . labetalol  20 mg Intravenous Once  . lidocaine  1 patch Transdermal Q24H  . tamsulosin  0.4 mg Oral QPC supper  . venlafaxine XR  75 mg Oral QHS   Continuous Infusions: . cefTRIAXone (ROCEPHIN)  IV 1 g (03/08/19 0308)    Procedures/Studies: DG Abdomen 1 View  Result Date: 03/07/2019 CLINICAL DATA:  Constipation EXAM: ABDOMEN - 1 VIEW COMPARISON:  CT renal colic [id number] FINDINGS: No high-grade obstructive bowel gas pattern is seen. No large stool burden. No suspicious calcifications overlying the course of either ureter, renal shadow or gallbladder fossa. Postsurgical changes from prior L4-S1 posterior spinal fusion without evidence of acute hardware complication. Multilevel degenerative changes present in the spine. Redemonstrated T12 and L1  compression deformities, incompletely assessed on frontal only view. Remaining osseous structures are unremarkable. Included lung bases are clear. Cardiomediastinal contours are free of acute Abner IMPRESSION: 1. No high-grade obstructive bowel gas pattern. No large stool burden. No suspicious calcifications. 2. Redemonstrated endplate deformities of T12 and L1, incompletely assessed in the absence of lateral radiograph. Correlate for point tenderness and if present consider dedicated imaging. 3. Postsurgical changes from prior L4-S1 posterior spinal fusion. Electronically Signed   By: Lovena Le M.D.   On: 03/07/2019 00:11   CT Head Wo Contrast  Result Date: 03/07/2019 CLINICAL DATA:  Delirium, altered mental status. EXAM: CT HEAD WITHOUT CONTRAST TECHNIQUE: Contiguous axial images were obtained from the base of the skull through the vertex without intravenous contrast. COMPARISON:  01/18/2013 FINDINGS: Brain: There is atrophy and chronic small vessel disease changes. No acute intracranial abnormality. Specifically, no hemorrhage, hydrocephalus, mass lesion, acute infarction, or significant intracranial injury. Vascular: No hyperdense vessel or unexpected calcification. Skull: No acute calvarial abnormality. Sinuses/Orbits: Visualized paranasal sinuses and mastoids clear. Orbital soft tissues unremarkable. Other: None IMPRESSION:  Atrophy, chronic microvascular disease. No acute intracranial abnormality. Electronically Signed   By: Rolm Baptise M.D.   On: 03/07/2019 03:19   CT Abdomen Pelvis W Contrast  Result Date: 03/07/2019 CLINICAL DATA:  Abdominal distension, minimal urinary catheter output, fall from chair EXAM: CT ABDOMEN AND PELVIS WITH CONTRAST TECHNIQUE: Multidetector CT imaging of the abdomen and pelvis was performed using the standard protocol following bolus administration of intravenous contrast. CONTRAST:  17mL OMNIPAQUE IOHEXOL 300 MG/ML  SOLN COMPARISON:  CT 08/15/2017 FINDINGS: Lower  chest: Atelectatic changes noted in the lung bases. Normal heart size. No pericardial effusion. Atherosclerotic calcification of the coronary arteries. Hepatobiliary: No focal liver abnormality is seen. No gallstones, gallbladder wall thickening, or biliary dilatation. Pancreas: Fatty replacement of the pancreas. No pancreatic ductal dilatation or surrounding inflammatory changes. Spleen: Normal in size without focal abnormality. Adrenals/Urinary Tract: Mild nodular thickening of the adrenal glands is similar to the prior without concerning dominant nodule. There is a 9.8 cm fluid attenuation cyst involving the lower pole right kidney. A smaller 2.6 cm fluid attenuation cyst is seen arising from the lower pole left kidney as well. Additional subcentimeter hypoattenuating foci in both kidneys are too small to fully characterize. There are several nonobstructing collecting system calculi in the lower pole right kidney. There is mild left and moderate right hydroureteronephrosis to the level of the circumferentially thickened bladder without visible obstructing urolithiasis. The urinary bladder appears circumferentially thickened with perivesicular hazy stranding and some mucosal hyperemia. There is indentation of the posterior bladder base by the enlarged prostate. An inflated Foley catheter is positioned within the prostate itself. Stomach/Bowel: Distal esophagus, stomach and duodenal sweep are unremarkable. No small bowel wall thickening or dilatation. No evidence of obstruction. A normal appendix is visualized. No colonic dilatation or wall thickening. Scattered colonic diverticula without focal pericolonic inflammation to suggest diverticulitis. Vascular/Lymphatic: Extensive atherosclerotic calcification of the aorta and branch vessels. No aneurysm or ectasia. No pathologically enlarged abdominopelvic lymph nodes. Reproductive: The inflated Foley catheter balloon is positioned within the central prostate. There is  a partially rim enhancing hypoattenuating collection to the left of the Foley catheter balloon worrisome for developing prostate abscess measuring 2.1 by 1.0 cm in size. Other: Perivesicular inflammatory changes, as detailed above. No free fluid or free air in the abdomen or pelvis. No bowel containing hernias. Mild body wall edema. Postsurgical changes of the posterior midline soft tissues. Small focus of gas in the subcutaneous tissues of the left anterolateral abdominal wall may reflect removal of a neurostimulator device seen on comparison CT. Correlate with surgical history. Musculoskeletal: There is been prior L5-S1 posterior spinal fusion as well as postsurgical decompression of the T8 level. Stable compression deformities of T12 and L1. No acute or suspicious osseous lesions are seen. IMPRESSION: 1. Inflated Foley catheter balloon is positioned within the prostate with a small hypoattenuating rim enhancing collection in the adjacent prostate tissue worrisome for developing prostate abscess. 2. Circumferentially thickened urinary bladder with perivesicular hazy stranding and mucosal hyperemia. Findings are concerning for cystitis as well. 3. There is mild left and moderate right hydroureteronephrosis to the level of the circumferentially thickened bladder without visible obstructing urolithiasis. Findings could reflect distension secondary to outlet obstruction or severe cystitis. 4. Soft tissue gas in the left anterolateral abdominal wall and edematous changes in the posterior soft tissues of the left flank may be related to removal of a previously seen at neurostimulator battery pack device. Correlate with surgical history. 5. Colonic diverticulosis without evidence of  diverticulitis. 6. Coronary artery calcifications. 7. Unchanged compression deformities at T12 and L1. No acute osseous injury. These results were called by telephone at the time of interpretation on 03/07/2019 at 3:29 am to provider IVA KNAPP  , who verbally acknowledged these results. Electronically Signed   By: Lovena Le M.D.   On: 03/07/2019 03:30   DG C-Arm 1-60 Min-No Report  Result Date: 03/01/2019 Fluoroscopy was utilized by the requesting physician.  No radiographic interpretation.    Orson Eva, DO  [facility] Pager [phone]  If 7PM-7AM, please contact night-coverage [email or web address] Password [email or web address] 03/08/2019, 2:40 PM   LOS: 0 days

## 2019-03-08 NOTE — Care Management (Signed)
PT recommended SNF. Patient and family agreeable. Emailed list to brother to review SNF list as requested.

## 2019-03-08 NOTE — Progress Notes (Signed)
Subjective: Patient reports leakage around foley catheter. The cathter stopped draining late afternoon. No pain  Objective: Vital signs in last 24 hours: Temp:  [98.4 F (36.9 C)-99.6 F (37.6 C)] 98.4 F (36.9 C) (01/27 [id number]) Pulse Rate:  [76-105] 76 (01/27 2058) Resp:  [16-18] 18 (01/27 2058) BP: (156-163)/(90-97) 160/90 (01/27 2058) SpO2:  [98 %-100 %] 100 % (01/27 2058)  Intake/Output from previous day: 01/26 0701 - 01/27 0700 In: 580 [P.O.:480; IV Piggyback:100] Out: 1200 [Urine:1200] Intake/Output this shift: No intake/output data recorded.  Physical Exam:  General:alert, cooperative and appears stated age GI: soft, non tender, normal bowel sounds, no palpable masses, no organomegaly, no inguinal hernia Male genitalia: no penile lesions or discharge no testicular masses no bladder distension noted Extremities: extremities normal, atraumatic, no cyanosis or edema  Lab Results: Recent Labs    03/07/19 0044 03/08/19 0524  HGB 14.1 13.8  HCT 43.2 42.9   BMET Recent Labs    03/07/19 0044 03/08/19 0524  NA 137 139  K 3.7 3.8  CL 100 102  CO2 27 25  GLUCOSE 125* 99  BUN 20 18  CREATININE 1.02 0.96  CALCIUM 9.3 9.8   No results for input(s): LABPT, INR in the last 72 hours. No results for input(s): LABURIN in the last 72 hours. Results for orders placed or performed during the hospital encounter of 03/06/19  SARS CORONAVIRUS 2 (TAT 6-24 HRS) Nasopharyngeal Nasopharyngeal Swab     Status: None   Collection Time: 03/07/19  1:34 AM   Specimen: Nasopharyngeal Swab  Result Value Ref Range Status   SARS Coronavirus 2 NEGATIVE NEGATIVE Final    Comment: (NOTE) SARS-CoV-2 target nucleic acids are NOT DETECTED. The SARS-CoV-2 RNA is generally detectable in upper and lower respiratory specimens during the acute phase of infection. Negative results do not preclude SARS-CoV-2 infection, do not rule out co-infections with other pathogens, and should not be used as  the sole basis for treatment or other patient management decisions. Negative results must be combined with clinical observations, patient history, and epidemiological information. The expected result is Negative. Fact Sheet for Patients: SugarRoll.be Fact Sheet for Healthcare Providers: https://www.woods-mathews.com/ This test is not yet approved or cleared by the [place] FDA and  has been authorized for detection and/or diagnosis of SARS-CoV-2 by FDA under an Emergency Use Authorization (EUA). This EUA will remain  in effect (meaning this test can be used) for the duration of the COVID-19 declaration under Section 56 4(b)(1) of the Act, 21 U.S.C. section 360bbb-3(b)(1), unless the authorization is terminated or revoked sooner. Performed at [facility], [place] [address] [place], [place] [address]   Urine culture     Status: None   Collection Time: 03/07/19  2:04 AM   Specimen: Urine, Catheterized  Result Value Ref Range Status   Specimen Description   Final    URINE, CATHETERIZED Performed at [facility], [address], [place], [place] [address]    Special Requests   Final    Normal Performed at [facility] [address], [place], [place] [address]    Culture   Final    NO GROWTH Performed at [facility], [place] [address], [place], [place] [address]    Report Status 03/08/2019 FINAL  Final    Studies/Results: DG Abdomen 1 View  Result Date: 03/07/2019 CLINICAL DATA:  Constipation EXAM: ABDOMEN - 1 VIEW COMPARISON:  CT renal colic [id number] FINDINGS: No high-grade obstructive bowel gas pattern is seen. No  large stool burden. No suspicious calcifications overlying the course of either ureter, renal shadow or gallbladder fossa. Postsurgical changes from prior L4-S1 posterior spinal fusion without evidence of acute hardware complication. Multilevel degenerative changes present in the spine. Redemonstrated T12 and L1  compression deformities, incompletely assessed on frontal only view. Remaining osseous structures are unremarkable. Included lung bases are clear. Cardiomediastinal contours are free of acute Abner IMPRESSION: 1. No high-grade obstructive bowel gas pattern. No large stool burden. No suspicious calcifications. 2. Redemonstrated endplate deformities of T12 and L1, incompletely assessed in the absence of lateral radiograph. Correlate for point tenderness and if present consider dedicated imaging. 3. Postsurgical changes from prior L4-S1 posterior spinal fusion. Electronically Signed   By: Lovena Le M.D.   On: 03/07/2019 00:11   CT Head Wo Contrast  Result Date: 03/07/2019 CLINICAL DATA:  Delirium, altered mental status. EXAM: CT HEAD WITHOUT CONTRAST TECHNIQUE: Contiguous axial images were obtained from the base of the skull through the vertex without intravenous contrast. COMPARISON:  01/18/2013 FINDINGS: Brain: There is atrophy and chronic small vessel disease changes. No acute intracranial abnormality. Specifically, no hemorrhage, hydrocephalus, mass lesion, acute infarction, or significant intracranial injury. Vascular: No hyperdense vessel or unexpected calcification. Skull: No acute calvarial abnormality. Sinuses/Orbits: Visualized paranasal sinuses and mastoids clear. Orbital soft tissues unremarkable. Other: None IMPRESSION: Atrophy, chronic microvascular disease. No acute intracranial abnormality. Electronically Signed   By: Rolm Baptise M.D.   On: 03/07/2019 03:19   CT Abdomen Pelvis W Contrast  Result Date: 03/07/2019 CLINICAL DATA:  Abdominal distension, minimal urinary catheter output, fall from chair EXAM: CT ABDOMEN AND PELVIS WITH CONTRAST TECHNIQUE: Multidetector CT imaging of the abdomen and pelvis was performed using the standard protocol following bolus administration of intravenous contrast. CONTRAST:  170mL OMNIPAQUE IOHEXOL 300 MG/ML  SOLN COMPARISON:  CT 08/15/2017 FINDINGS: Lower  chest: Atelectatic changes noted in the lung bases. Normal heart size. No pericardial effusion. Atherosclerotic calcification of the coronary arteries. Hepatobiliary: No focal liver abnormality is seen. No gallstones, gallbladder wall thickening, or biliary dilatation. Pancreas: Fatty replacement of the pancreas. No pancreatic ductal dilatation or surrounding inflammatory changes. Spleen: Normal in size without focal abnormality. Adrenals/Urinary Tract: Mild nodular thickening of the adrenal glands is similar to the prior without concerning dominant nodule. There is a 9.8 cm fluid attenuation cyst involving the lower pole right kidney. A smaller 2.6 cm fluid attenuation cyst is seen arising from the lower pole left kidney as well. Additional subcentimeter hypoattenuating foci in both kidneys are too small to fully characterize. There are several nonobstructing collecting system calculi in the lower pole right kidney. There is mild left and moderate right hydroureteronephrosis to the level of the circumferentially thickened bladder without visible obstructing urolithiasis. The urinary bladder appears circumferentially thickened with perivesicular hazy stranding and some mucosal hyperemia. There is indentation of the posterior bladder base by the enlarged prostate. An inflated Foley catheter is positioned within the prostate itself. Stomach/Bowel: Distal esophagus, stomach and duodenal sweep are unremarkable. No small bowel wall thickening or dilatation. No evidence of obstruction. A normal appendix is visualized. No colonic dilatation or wall thickening. Scattered colonic diverticula without focal pericolonic inflammation to suggest diverticulitis. Vascular/Lymphatic: Extensive atherosclerotic calcification of the aorta and branch vessels. No aneurysm or ectasia. No pathologically enlarged abdominopelvic lymph nodes. Reproductive: The inflated Foley catheter balloon is positioned within the central prostate. There is  a partially rim enhancing hypoattenuating collection to the left of the Foley catheter balloon worrisome for developing prostate abscess  measuring 2.1 by 1.0 cm in size. Other: Perivesicular inflammatory changes, as detailed above. No free fluid or free air in the abdomen or pelvis. No bowel containing hernias. Mild body wall edema. Postsurgical changes of the posterior midline soft tissues. Small focus of gas in the subcutaneous tissues of the left anterolateral abdominal wall may reflect removal of a neurostimulator device seen on comparison CT. Correlate with surgical history. Musculoskeletal: There is been prior L5-S1 posterior spinal fusion as well as postsurgical decompression of the T8 level. Stable compression deformities of T12 and L1. No acute or suspicious osseous lesions are seen. IMPRESSION: 1. Inflated Foley catheter balloon is positioned within the prostate with a small hypoattenuating rim enhancing collection in the adjacent prostate tissue worrisome for developing prostate abscess. 2. Circumferentially thickened urinary bladder with perivesicular hazy stranding and mucosal hyperemia. Findings are concerning for cystitis as well. 3. There is mild left and moderate right hydroureteronephrosis to the level of the circumferentially thickened bladder without visible obstructing urolithiasis. Findings could reflect distension secondary to outlet obstruction or severe cystitis. 4. Soft tissue gas in the left anterolateral abdominal wall and edematous changes in the posterior soft tissues of the left flank may be related to removal of a previously seen at neurostimulator battery pack device. Correlate with surgical history. 5. Colonic diverticulosis without evidence of diverticulitis. 6. Coronary artery calcifications. 7. Unchanged compression deformities at T12 and L1. No acute osseous injury. These results were called by telephone at the time of interpretation on 03/07/2019 at 3:29 am to provider IVA KNAPP  , who verbally acknowledged these results. Electronically Signed   By: Lovena Le M.D.   On: 03/07/2019 03:30    Assessment/Plan: Urinary retention  1. Foley replaced with a 16 french foley catheter. Foley was flushed and drained appropriately. The patient can have a voiding trial in 3-4 days   LOS: 0 days   Nicolette Bang 03/08/2019, 9:50 PM

## 2019-03-08 NOTE — Evaluation (Signed)
Physical Therapy Evaluation Patient Details Name: Luke Foster MRN: [id number] DOB: 11/03/1950 Today's Date: 03/08/2019   History of Present Illness  Luke Foster is a 69 y.o. male with medical history significant of osteoarthritis, chronic back pain, hypertension, lithiasis, lumbar radiculopathy, lumbar spine stimulator placement history with recent removal of this device 5 days ago followed by urinary retention and Foley catheter placement who is coming to the emergency department due to minimal urinary output from his catheter.  He has also been constipated for the past 2 to 3 days.  EMS also reported in triage that the patient fell at his chair about 4 hours before arriving to the ED on an earlier call and the EMS crew assisted him back to his chair.  He is unable to provide further history at this time.  He is afebrile.  He answers simple questions and denies headache, chest pain or abdominal pain at this time.    Clinical Impression  Patient frequently falls backwards while seated at bedside due to weakness, had difficulty completing sit to stands requiring Mod/max assist and limited to a few steps at bedside due to fall risk.  Patient tolerated sitting up in chair after therapy with his brother present in room.  Patient will benefit from continued physical therapy in hospital and recommended venue below to increase strength, balance, endurance for safe ADLs and gait.     Follow Up Recommendations SNF    Equipment Recommendations  None recommended by PT    Recommendations for Other Services       Precautions / Restrictions Precautions Precautions: Fall Restrictions Weight Bearing Restrictions: No      Mobility  Bed Mobility Overal bed mobility: Needs Assistance Bed Mobility: Supine to Sit     Supine to sit: Mod assist;Max assist     General bed mobility comments: slow labored movement  Transfers Overall transfer level: Needs assistance Equipment used: Rolling walker  (2 wheeled) Transfers: Sit to/from [facility] Sit to Stand: Mod assist Stand pivot transfers: Mod assist       General transfer comment: slow labored movement, had most difficulty with sit to stands due to BLE weakness  Ambulation/Gait Ambulation/Gait assistance: Mod assist;Max assist Gait Distance (Feet): 4 Feet Assistive device: Rolling walker (2 wheeled) Gait Pattern/deviations: Decreased step length - right;Decreased step length - left;Decreased stride length Gait velocity: slow   General Gait Details: limited to 4-5 slow labored side steps at bedside due to weakness, poor standing balance  Stairs            Wheelchair Mobility    Modified Rankin (Stroke Patients Only)       Balance Overall balance assessment: Needs assistance Sitting-balance support: Feet supported;Bilateral upper extremity supported Sitting balance-Leahy Scale: Poor Sitting balance - Comments: fair/poor with frequent falling backwards due to weakness   Standing balance support: During functional activity;Bilateral upper extremity supported Standing balance-Leahy Scale: Poor Standing balance comment: using RW                             Pertinent Vitals/Pain Pain Assessment: Faces Faces Pain Scale: Hurts a little bit Pain Location: low back Pain Descriptors / Indicators: Sore;Discomfort Pain Intervention(s): Limited activity within patient's tolerance;Monitored during session    Home Living   Living Arrangements: Alone                    Prior Function Level of Independence: Independent with assistive device(s)  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[employer], drives     Journalist, newspaper        Extremity/Trunk Assessment   Upper Extremity Assessment Upper Extremity Assessment: Generalized weakness    Lower Extremity Assessment Lower Extremity Assessment: Generalized weakness    Cervical / Trunk Assessment Cervical / Trunk Assessment:  Normal  Communication   Communication: No difficulties  Cognition Arousal/Alertness: Awake/alert Behavior During Therapy: WFL for tasks assessed/performed Overall Cognitive Status: Within Functional Limits for tasks assessed                                        General Comments      Exercises     Assessment/Plan    PT Assessment Patient needs continued PT services  PT Problem List Decreased strength;Decreased activity tolerance;Decreased balance;Decreased mobility       PT Treatment Interventions Gait training;Stair training;Functional mobility training;Therapeutic activities;Therapeutic exercise;Patient/family education    PT Goals (Current goals can be found in the Care Plan section)  Acute Rehab PT Goals Patient Stated Goal: return home after rehab PT Goal Formulation: With patient Time For Goal Achievement: 03/22/19 Potential to Achieve Goals: Good    Frequency Min 3X/week   Barriers to discharge        Co-evaluation               AM-PAC PT "6 Clicks" Mobility  Outcome Measure Help needed turning from your back to your side while in a flat bed without using bedrails?: A Lot Help needed moving from lying on your back to sitting on the side of a flat bed without using bedrails?: A Lot Help needed moving to and from a bed to a chair (including a wheelchair)?: A Lot Help needed standing up from a chair using your arms (e.g., wheelchair or bedside chair)?: A Lot Help needed to walk in hospital room?: A Lot Help needed climbing 3-5 steps with a railing? : Total 6 Click Score: 11    End of Session   Activity Tolerance: Patient tolerated treatment well;Patient limited by fatigue Patient left: in chair;with call bell/phone within reach;with family/visitor present Nurse Communication: Mobility status PT Visit Diagnosis: Unsteadiness on feet (R26.81);Other abnormalities of gait and mobility (R26.89);Muscle weakness (generalized) (M62.81)     Time: [id number] PT Time Calculation (min) (ACUTE ONLY): 28 min   Charges:   PT Evaluation $PT Eval Moderate Complexity: 1 Mod PT Treatments $Therapeutic Activity: 23-37 mins        3:09 PM, 03/08/19 Lonell Grandchild, MPT Physical Therapist with [facility] [phone] office [phone] mobile phone

## 2019-03-08 NOTE — NC FL2 (Signed)
[place] LEVEL OF CARE SCREENING TOOL     IDENTIFICATION  Patient Name: Luke Foster Birthdate: 11-08-1950 Sex: male Admission Date (Current Location): 03/06/2019  [facility] and [place] Number:  [employer] and Address:  [place] [address], [place]      Provider Number: [id number]  Attending Physician Name and Address:  Orson Eva, MD  Relative Name and Phone Number:  [name]    Current Level of Care: Hospital Recommended Level of Care: [place] Prior Approval Number:    Date Approved/Denied:   PASRR Number: [id number]  Discharge Plan: SNF    Current Diagnoses: Patient Active Problem List   Diagnosis Date Noted  . Bilateral hydronephrosis 03/07/2019  . Altered mental status 03/07/2019  . Hypertension   . Neuropathy 01/21/2017  . Pain in right foot 01/21/2017  . Pain in left foot 01/21/2017  . Chronic pain syndrome 01/21/2017  . Post laminectomy syndrome 01/21/2017  . Status post insertion of spinal cord stimulator 01/21/2017  . Uncomplicated opioid dependence ([place]) 01/21/2017  . Lumbosacral radiculopathy 12/09/2012    Orientation RESPIRATION BLADDER Height & Weight     Self, Time, Situation  Normal Indwelling catheter Weight: 86.5 kg Height:  5\' 8"  (172.7 cm)  BEHAVIORAL SYMPTOMS/MOOD NEUROLOGICAL BOWEL NUTRITION STATUS      Continent Diet(see DC summary)  AMBULATORY STATUS COMMUNICATION OF NEEDS Skin   Extensive Assist Verbally Normal                       Personal Care Assistance Level of Assistance  Bathing, Feeding, Dressing Bathing Assistance: Limited assistance Feeding assistance: Independent Dressing Assistance: Limited assistance     Functional Limitations Info  Sight, Hearing, Speech Sight Info: Adequate Hearing Info: Adequate Speech Info: Adequate    SPECIAL CARE FACTORS FREQUENCY  PT (By licensed PT)     PT Frequency: 5x/week               Contractures Contractures Info: Not present    Additional Factors Info  Code Status, Allergies Code Status Info: FULL Allergies Info: Erythromycin           Current Medications (03/08/2019):  This is the current hospital active medication list Current Facility-Administered Medications  Medication Dose Route Frequency Provider Last Rate Last Admin  . acetaminophen (TYLENOL) tablet 650 mg  650 mg Oral Q6H PRN Reubin Milan, MD       Or  . acetaminophen (TYLENOL) suppository 650 mg  650 mg Rectal Q6H PRN Reubin Milan, MD      . cefTRIAXone (ROCEPHIN) 1 g in sodium chloride 0.9 % 100 mL IVPB  1 g Intravenous Q24H Reubin Milan, MD 200 mL/hr at 03/08/19 0308 1 g at 03/08/19 0308  . Chlorhexidine Gluconate Cloth 2 % PADS 6 each  6 each Topical Daily Barton Dubois, MD      . labetalol (NORMODYNE) injection 20 mg  20 mg Intravenous Once Rolland Porter, MD      . lidocaine (LIDODERM) 5 % 1 patch  1 patch Transdermal Q24H Gardiner Barefoot, NP   1 patch at 03/07/19 2048  . tamsulosin (FLOMAX) capsule 0.4 mg  0.4 mg Oral QPC supper Barton Dubois, MD   0.4 mg at 03/07/19 1831  . venlafaxine XR (EFFEXOR-XR) 24 hr capsule 75 mg  75 mg Oral QHS Barton Dubois, MD   75 mg at 03/07/19 2048     Discharge Medications: Please  see discharge summary for a list of discharge medications.  Relevant Imaging Results:  Relevant Lab Results:   Additional Information SS# [id number]  Annabell Oconnor, Chauncey Reading, RN

## 2019-03-08 NOTE — Plan of Care (Signed)
  Problem: Acute Rehab PT Goals(only PT should resolve) Goal: Pt Will Go Supine/Side To Sit Outcome: Progressing Flowsheets (Taken 03/08/2019 1510) Pt will go Supine/Side to Sit: with minimal assist Goal: Patient Will Transfer Sit To/From Stand Outcome: Progressing Flowsheets (Taken 03/08/2019 1510) Patient will transfer sit to/from stand:  with minimal assist  with moderate assist Goal: Pt Will Transfer Bed To Chair/Chair To Bed Outcome: Progressing Flowsheets (Taken 03/08/2019 1510) Pt will Transfer Bed to Chair/Chair to Bed: with min assist Goal: Pt Will Ambulate Outcome: Progressing Flowsheets (Taken 03/08/2019 1510) Pt will Ambulate:  25 feet  with minimal assist  with moderate assist  with rolling walker   3:11 PM, 03/08/19 Lonell Grandchild, MPT Physical Therapist with [facility] [phone] office [phone] mobile phone

## 2019-03-09 DIAGNOSIS — Z79899 Other long term (current) drug therapy: Secondary | ICD-10-CM | POA: Diagnosis not present

## 2019-03-09 DIAGNOSIS — M21371 Foot drop, right foot: Secondary | ICD-10-CM | POA: Diagnosis not present

## 2019-03-09 DIAGNOSIS — G9341 Metabolic encephalopathy: Secondary | ICD-10-CM

## 2019-03-09 DIAGNOSIS — R338 Other retention of urine: Secondary | ICD-10-CM | POA: Diagnosis not present

## 2019-03-09 DIAGNOSIS — R7989 Other specified abnormal findings of blood chemistry: Secondary | ICD-10-CM | POA: Diagnosis not present

## 2019-03-09 DIAGNOSIS — M48061 Spinal stenosis, lumbar region without neurogenic claudication: Secondary | ICD-10-CM | POA: Diagnosis not present

## 2019-03-09 DIAGNOSIS — N481 Balanitis: Secondary | ICD-10-CM | POA: Diagnosis not present

## 2019-03-09 DIAGNOSIS — N1330 Unspecified hydronephrosis: Secondary | ICD-10-CM | POA: Diagnosis not present

## 2019-03-09 DIAGNOSIS — S22030A Wedge compression fracture of third thoracic vertebra, initial encounter for closed fracture: Secondary | ICD-10-CM | POA: Diagnosis not present

## 2019-03-09 DIAGNOSIS — G4730 Sleep apnea, unspecified: Secondary | ICD-10-CM | POA: Diagnosis not present

## 2019-03-09 DIAGNOSIS — E46 Unspecified protein-calorie malnutrition: Secondary | ICD-10-CM | POA: Diagnosis not present

## 2019-03-09 DIAGNOSIS — T85112D Breakdown (mechanical) of implanted electronic neurostimulator (electrode) of spinal cord, subsequent encounter: Secondary | ICD-10-CM | POA: Diagnosis not present

## 2019-03-09 DIAGNOSIS — M5441 Lumbago with sciatica, right side: Secondary | ICD-10-CM | POA: Diagnosis not present

## 2019-03-09 DIAGNOSIS — R339 Retention of urine, unspecified: Secondary | ICD-10-CM | POA: Diagnosis not present

## 2019-03-09 DIAGNOSIS — M6281 Muscle weakness (generalized): Secondary | ICD-10-CM | POA: Diagnosis not present

## 2019-03-09 DIAGNOSIS — M5442 Lumbago with sciatica, left side: Secondary | ICD-10-CM | POA: Diagnosis not present

## 2019-03-09 DIAGNOSIS — M1380 Other specified arthritis, unspecified site: Secondary | ICD-10-CM | POA: Diagnosis not present

## 2019-03-09 DIAGNOSIS — G894 Chronic pain syndrome: Secondary | ICD-10-CM | POA: Diagnosis not present

## 2019-03-09 DIAGNOSIS — T85192S Other mechanical complication of implanted electronic neurostimulator (electrode) of spinal cord, sequela: Secondary | ICD-10-CM | POA: Diagnosis not present

## 2019-03-09 DIAGNOSIS — G8929 Other chronic pain: Secondary | ICD-10-CM | POA: Diagnosis not present

## 2019-03-09 DIAGNOSIS — I10 Essential (primary) hypertension: Secondary | ICD-10-CM | POA: Diagnosis not present

## 2019-03-09 DIAGNOSIS — G2581 Restless legs syndrome: Secondary | ICD-10-CM | POA: Diagnosis not present

## 2019-03-09 DIAGNOSIS — Z743 Need for continuous supervision: Secondary | ICD-10-CM | POA: Diagnosis not present

## 2019-03-09 DIAGNOSIS — R29898 Other symptoms and signs involving the musculoskeletal system: Secondary | ICD-10-CM | POA: Diagnosis not present

## 2019-03-09 DIAGNOSIS — N1339 Other hydronephrosis: Secondary | ICD-10-CM | POA: Diagnosis not present

## 2019-03-09 DIAGNOSIS — Z7401 Bed confinement status: Secondary | ICD-10-CM | POA: Diagnosis not present

## 2019-03-09 DIAGNOSIS — R279 Unspecified lack of coordination: Secondary | ICD-10-CM | POA: Diagnosis not present

## 2019-03-09 LAB — BASIC METABOLIC PANEL
Anion gap: 9 (ref 5–15)
BUN: 20 mg/dL (ref 8–23)
CO2: 28 mmol/L (ref 22–32)
Calcium: 9.6 mg/dL (ref 8.9–10.3)
Chloride: 104 mmol/L (ref 98–111)
Creatinine, Ser: 0.93 mg/dL (ref 0.61–1.24)
GFR calc Af Amer: >60 mL/min (ref >60)
GFR calc non Af Amer: >60 mL/min (ref >60)
Glucose, Bld: 112 mg/dL — ABNORMAL HIGH (ref 70–99)
Potassium: 3.5 mmol/L (ref 3.5–5.1)
Sodium: 141 mmol/L (ref 135–145)

## 2019-03-09 LAB — CBC
HCT: 40.7 % (ref 39.0–52.0)
Hemoglobin: 12.9 g/dL — ABNORMAL LOW (ref 13.0–17.0)
MCH: 28.7 pg (ref 26.0–34.0)
MCHC: 31.7 g/dL (ref 30.0–36.0)
MCV: 90.4 fL (ref 80.0–100.0)
Platelets: 258 10*3/uL (ref 150–400)
RBC: 4.5 MIL/uL (ref 4.22–5.81)
RDW: 11.5 % (ref 11.5–15.5)
WBC: 5.8 10*3/uL (ref 4.0–10.5)
nRBC: 0 % (ref 0.0–0.2)

## 2019-03-09 LAB — VITAMIN B12: Vitamin B-12: 126 pg/mL — ABNORMAL LOW (ref 180–914)

## 2019-03-09 LAB — TSH: TSH: 0.113 u[IU]/mL — ABNORMAL LOW (ref 0.350–4.500)

## 2019-03-09 LAB — FOLATE: Folate: 7.7 ng/mL (ref >5.9)

## 2019-03-09 LAB — AMMONIA: Ammonia: 10 umol/L (ref 9–35)

## 2019-03-09 LAB — T4, FREE: Free T4: 1.09 ng/dL (ref 0.61–1.12)

## 2019-03-09 MED ORDER — OXYCODONE HCL 5 MG PO TABS
5.0000 mg | ORAL_TABLET | ORAL | Status: DC | PRN
  Administered 2019-03-09: 5 mg via ORAL
  Filled 2019-03-09: qty 1

## 2019-03-09 MED ORDER — CYANOCOBALAMIN 500 MCG PO TABS: 500.0000 ug | ORAL_TABLET | Freq: Every day | ORAL | Status: DC

## 2019-03-09 MED ORDER — INFLUENZA VAC A&B SA ADJ QUAD 0.5 ML IM PRSY: 0.5000 mL | PREFILLED_SYRINGE | INTRAMUSCULAR | Status: DC

## 2019-03-09 MED ORDER — OXYCODONE HCL 10 MG PO TABS: 10.0000 mg | ORAL_TABLET | Freq: Four times a day (QID) | ORAL | 0 refills | Status: DC | PRN

## 2019-03-09 MED ORDER — OXYBUTYNIN CHLORIDE 5 MG PO TABS: 5.0000 mg | ORAL_TABLET | Freq: Three times a day (TID) | ORAL | 0 refills | Status: DC

## 2019-03-09 MED ORDER — OXYCODONE HCL 5 MG PO TABS: 5.0000 mg | ORAL_TABLET | ORAL | Status: DC | PRN

## 2019-03-09 MED ORDER — CEFDINIR 300 MG PO CAPS
300.0000 mg | ORAL_CAPSULE | Freq: Two times a day (BID) | ORAL | Status: DC
  Administered 2019-03-09: 300 mg via ORAL
  Filled 2019-03-09: qty 1

## 2019-03-09 MED ORDER — CYANOCOBALAMIN 1000 MCG/ML IJ SOLN
1000.0000 ug | Freq: Every day | INTRAMUSCULAR | Status: DC
  Administered 2019-03-09: 13:00:00 1000 ug via INTRAMUSCULAR
  Filled 2019-03-09: qty 1

## 2019-03-09 MED ORDER — OXYCODONE HCL 5 MG PO TABS
10.0000 mg | ORAL_TABLET | Freq: Four times a day (QID) | ORAL | Status: DC | PRN
  Administered 2019-03-09: 12:00:00 10 mg via ORAL
  Filled 2019-03-09: qty 2

## 2019-03-09 MED ORDER — VITAMIN B-12 100 MCG PO TABS: 500.0000 ug | ORAL_TABLET | Freq: Every day | ORAL | Status: DC

## 2019-03-09 MED ORDER — CEFDINIR 300 MG PO CAPS: 300.0000 mg | ORAL_CAPSULE | Freq: Two times a day (BID) | ORAL | 0 refills | Status: DC

## 2019-03-09 MED ORDER — FOLIC ACID 1 MG PO TABS
1.0000 mg | ORAL_TABLET | Freq: Every day | ORAL | Status: DC
  Administered 2019-03-09: 1 mg via ORAL
  Filled 2019-03-09: qty 1

## 2019-03-09 MED FILL — OXYBUTYNIN 5 MG TABLET: 5 | 5 days supply | Qty: 15 | Fill #0

## 2019-03-09 MED FILL — CEFDINIR 300 MG CAPSULE: 300 | 2 days supply | Qty: 4 | Fill #0

## 2019-03-09 NOTE — TOC Transition Note (Addendum)
Transition of Care [facility]) - CM/SW Discharge Note   Patient Details  Name: Luke Foster MRN: [id number] Date of Birth: 1950-05-24  Transition of Care [facility]) CM/SW Contact:  Nana Hoselton, Chauncey Reading, RN Phone Number: 03/09/2019, 2:28 PM   Clinical Narrative:   Bed has opened up today at [facility]. Will DC today. Miquel Dunn requiring that insurance authorization be started. Will have EMS transport. DC clinicals sent.  Ins. auth ref# is [id number].     Final next level of care: Skilled Nursing Facility Barriers to Discharge: Barriers Resolved   Patient Goals and CMS Choice Patient states their goals for this hospitalization and ongoing recovery are:: go to SNF, then home CMS Medicare.gov Compare Post Acute Care list provided to:: Patient(and  brother)    Discharge Placement              Patient chooses bed at: [facility] Patient to be transferred to facility by: [place] Name of family member notified: [name] Patient and family notified of of transfer: 03/09/19  Discharge Plan and Services                                     Social Determinants of Health (SDOH) Interventions     Readmission Risk Interventions No flowsheet data found.

## 2019-03-09 NOTE — Progress Notes (Signed)
Patient c/o pain unrelieved by Tylenol. MD paged and new orders given.

## 2019-03-09 NOTE — Discharge Summary (Signed)
Physician Discharge Summary  Luke Foster [id number] DOB: Jul 18, 1950 DOA: 03/06/2019  PCP: [name], MD  Admit date: 03/06/2019 Discharge date: 03/09/2019  Admitted From: Home Disposition:  SNF  Recommendations for Outpatient Follow-up:  1. Follow up with PCP in 1-2 weeks 2. Please obtain BMP/CBC in one week 3. Please remove foley catheter on 03/12/19 for voiding trial    Discharge Condition: Stable CODE STATUS: FULL Diet recommendation: Heart Healthy    Brief/Interim Summary: 69 year old male with a history of osteoarthritis, chronic back pain, Hypertension, Depression presenting with decreased urine output from his foley catheter.  The patient had his spinal stimulator removed on 03/01/2019 (by Dr. Elayne Guerin from [facility]) after which he had acute urinary retention.  Foley catheter was placed at that time.  In addition, the patient also had some increasing confusion.  They removed a stimulator that was pressing on a nerve on his right foot. Patient has had a right foot drop and pain for years. He uses a cane and a walker for ambulation.  Urology was consulted.  Dr. Diona Fanti saw the patient.  He deflated the balloon and repositioned the Foley catheter into the urinary bladder, and the balloon was reinflated in the bladder with good draining urine.  The patient's renal function remained stable.  He remained afebrile and hemodynamically stable.   Discharge Diagnoses:  Bilateral hydronephrosis -Likely secondary to Foley catheter malposition -Foley catheter has been repositioned by urology -However, the patient has had significant urine draining around his Foley catheter today -continue tamulosin  BPH with urinary retention -03/07/2019 CT abdomen pelvis--inflated Foley balloon positioned within the prostatic urethra with hypoattenuating enhancing collection adjacent to the prostatic tissue--urology did not feel this was abscess, more likely hematoma vs obstruction of  prostatic ducts -CT abdomen also shows circumferential urinary bladder thickening with perivesical hazy stranding and mucosal hyperemia concerning for cystitis; there was bilateral hydronephrosis -Continue tamsulosin -has been on empiric ceftriaxone -plan 2 more days cefdinir after d/c  Foley Catheter dysfunction -pt now with significant leakage around foley -reconsulting urology--discussed with Dr. Joseph Art bladder spasm--start ditropan trial -03/08/19--Dr. Alyson Ingles changed foley catheter to new 16 french catheter -remove foley for voiding trial on 03/12/19  Chronic pain syndrome -PMP aware queried -pain is controlled on lower oxycodone dose -Holding diazepam and cyclobenzaprine due to somnolence  Acute metabolic encephalopathy -Multifactorial including possible UTI, urinary retention, opioids -opioids reduced with pain still manageable;  Oxycodone 15 mg >>>10 mg -mental status back to baseline   Essential hypertension -Holding enalapril initially-->restart -Restart diltiazem CD  Discharge Instructions   Allergies as of 03/09/2019      Reactions   Erythromycin Itching      Medication List    STOP taking these medications   diazepam 10 MG tablet Commonly known as: VALIUM   ibuprofen 200 MG tablet Commonly known as: ADVIL     TAKE these medications   cefdinir 300 MG capsule Commonly known as: OMNICEF Take 1 capsule (300 mg total) by mouth every 12 (twelve) hours. X 2 days   cyclobenzaprine 10 MG tablet Commonly known as: FLEXERIL Take 10 mg by mouth 3 (three) times daily as needed for muscle spasms.   diltiazem 120 MG 24 hr capsule Commonly known as: CARDIZEM CD Take 120 mg by mouth every evening.   enalapril 5 MG tablet Commonly known as: VASOTEC Take 5 mg by mouth 2 (two) times daily.   Flomax 0.4 MG Caps capsule Generic drug: tamsulosin Take 0.4 mg by mouth every evening.  lidocaine 5 % Commonly known as: LIDODERM Place 1 patch onto  the skin daily as needed (pain.). Remove & Discard patch within 12 hours or as directed by MD   oxybutynin 5 MG tablet Commonly known as: DITROPAN Take 1 tablet (5 mg total) by mouth 3 (three) times daily. X 5 days   Oxycodone HCl 10 MG Tabs Commonly known as: Roxicodone Take 1 tablet (10 mg total) by mouth every 6 (six) hours as needed for moderate pain or severe pain. What changed:   medication strength  how much to take  when to take this  reasons to take this  Another medication with the same name was removed. Continue taking this medication, and follow the directions you see here.   venlafaxine XR 75 MG 24 hr capsule Commonly known as: EFFEXOR-XR Take 75 mg by mouth 3 (three) times daily.       Allergies  Allergen Reactions  . Erythromycin Itching    Consultations:  urology   Procedures/Studies: DG Abdomen 1 View  Result Date: 03/07/2019 CLINICAL DATA:  Constipation EXAM: ABDOMEN - 1 VIEW COMPARISON:  CT renal colic [id number] FINDINGS: No high-grade obstructive bowel gas pattern is seen. No large stool burden. No suspicious calcifications overlying the course of either ureter, renal shadow or gallbladder fossa. Postsurgical changes from prior L4-S1 posterior spinal fusion without evidence of acute hardware complication. Multilevel degenerative changes present in the spine. Redemonstrated T12 and L1 compression deformities, incompletely assessed on frontal only view. Remaining osseous structures are unremarkable. Included lung bases are clear. Cardiomediastinal contours are free of acute Abner IMPRESSION: 1. No high-grade obstructive bowel gas pattern. No large stool burden. No suspicious calcifications. 2. Redemonstrated endplate deformities of T12 and L1, incompletely assessed in the absence of lateral radiograph. Correlate for point tenderness and if present consider dedicated imaging. 3. Postsurgical changes from prior L4-S1 posterior spinal fusion. Electronically  Signed   By: Lovena Le M.D.   On: 03/07/2019 00:11   CT Head Wo Contrast  Result Date: 03/07/2019 CLINICAL DATA:  Delirium, altered mental status. EXAM: CT HEAD WITHOUT CONTRAST TECHNIQUE: Contiguous axial images were obtained from the base of the skull through the vertex without intravenous contrast. COMPARISON:  01/18/2013 FINDINGS: Brain: There is atrophy and chronic small vessel disease changes. No acute intracranial abnormality. Specifically, no hemorrhage, hydrocephalus, mass lesion, acute infarction, or significant intracranial injury. Vascular: No hyperdense vessel or unexpected calcification. Skull: No acute calvarial abnormality. Sinuses/Orbits: Visualized paranasal sinuses and mastoids clear. Orbital soft tissues unremarkable. Other: None IMPRESSION: Atrophy, chronic microvascular disease. No acute intracranial abnormality. Electronically Signed   By: Rolm Baptise M.D.   On: 03/07/2019 03:19   CT Abdomen Pelvis W Contrast  Result Date: 03/07/2019 CLINICAL DATA:  Abdominal distension, minimal urinary catheter output, fall from chair EXAM: CT ABDOMEN AND PELVIS WITH CONTRAST TECHNIQUE: Multidetector CT imaging of the abdomen and pelvis was performed using the standard protocol following bolus administration of intravenous contrast. CONTRAST:  124mL OMNIPAQUE IOHEXOL 300 MG/ML  SOLN COMPARISON:  CT 08/15/2017 FINDINGS: Lower chest: Atelectatic changes noted in the lung bases. Normal heart size. No pericardial effusion. Atherosclerotic calcification of the coronary arteries. Hepatobiliary: No focal liver abnormality is seen. No gallstones, gallbladder wall thickening, or biliary dilatation. Pancreas: Fatty replacement of the pancreas. No pancreatic ductal dilatation or surrounding inflammatory changes. Spleen: Normal in size without focal abnormality. Adrenals/Urinary Tract: Mild nodular thickening of the adrenal glands is similar to the prior without concerning dominant nodule. There is a 9.8 cm  fluid attenuation cyst involving the lower pole right kidney. A smaller 2.6 cm fluid attenuation cyst is seen arising from the lower pole left kidney as well. Additional subcentimeter hypoattenuating foci in both kidneys are too small to fully characterize. There are several nonobstructing collecting system calculi in the lower pole right kidney. There is mild left and moderate right hydroureteronephrosis to the level of the circumferentially thickened bladder without visible obstructing urolithiasis. The urinary bladder appears circumferentially thickened with perivesicular hazy stranding and some mucosal hyperemia. There is indentation of the posterior bladder base by the enlarged prostate. An inflated Foley catheter is positioned within the prostate itself. Stomach/Bowel: Distal esophagus, stomach and duodenal sweep are unremarkable. No small bowel wall thickening or dilatation. No evidence of obstruction. A normal appendix is visualized. No colonic dilatation or wall thickening. Scattered colonic diverticula without focal pericolonic inflammation to suggest diverticulitis. Vascular/Lymphatic: Extensive atherosclerotic calcification of the aorta and branch vessels. No aneurysm or ectasia. No pathologically enlarged abdominopelvic lymph nodes. Reproductive: The inflated Foley catheter balloon is positioned within the central prostate. There is a partially rim enhancing hypoattenuating collection to the left of the Foley catheter balloon worrisome for developing prostate abscess measuring 2.1 by 1.0 cm in size. Other: Perivesicular inflammatory changes, as detailed above. No free fluid or free air in the abdomen or pelvis. No bowel containing hernias. Mild body wall edema. Postsurgical changes of the posterior midline soft tissues. Small focus of gas in the subcutaneous tissues of the left anterolateral abdominal wall may reflect removal of a neurostimulator device seen on comparison CT. Correlate with surgical  history. Musculoskeletal: There is been prior L5-S1 posterior spinal fusion as well as postsurgical decompression of the T8 level. Stable compression deformities of T12 and L1. No acute or suspicious osseous lesions are seen. IMPRESSION: 1. Inflated Foley catheter balloon is positioned within the prostate with a small hypoattenuating rim enhancing collection in the adjacent prostate tissue worrisome for developing prostate abscess. 2. Circumferentially thickened urinary bladder with perivesicular hazy stranding and mucosal hyperemia. Findings are concerning for cystitis as well. 3. There is mild left and moderate right hydroureteronephrosis to the level of the circumferentially thickened bladder without visible obstructing urolithiasis. Findings could reflect distension secondary to outlet obstruction or severe cystitis. 4. Soft tissue gas in the left anterolateral abdominal wall and edematous changes in the posterior soft tissues of the left flank may be related to removal of a previously seen at neurostimulator battery pack device. Correlate with surgical history. 5. Colonic diverticulosis without evidence of diverticulitis. 6. Coronary artery calcifications. 7. Unchanged compression deformities at T12 and L1. No acute osseous injury. These results were called by telephone at the time of interpretation on 03/07/2019 at 3:29 am to provider IVA KNAPP , who verbally acknowledged these results. Electronically Signed   By: Lovena Le M.D.   On: 03/07/2019 03:30   DG C-Arm 1-60 Min-No Report  Result Date: 03/01/2019 Fluoroscopy was utilized by the requesting physician.  No radiographic interpretation.        Discharge Exam: Vitals:   03/08/19 2058 03/09/19 0557  BP: (!) 160/90 (!) 143/74  Pulse: 76 65  Resp: 18 16  Temp: 98.4 F (36.9 C) 98.3 F (36.8 C)  SpO2: 100% 100%   Vitals:   03/08/19 0501 03/08/19 1320 03/08/19 2058 03/09/19 0557  BP: (!) 163/95 (!) 156/97 (!) 160/90 (!) 143/74  Pulse:  76 (!) 105 76 65  Resp: 16 18 18 16   Temp: 99.1 F (37.3 C) 99.6 F (  37.6 C) 98.4 F (36.9 C) 98.3 F (36.8 C)  TempSrc: Oral Oral Oral Oral  SpO2: 100% 98% 100% 100%  Weight:      Height:        General: Pt is alert, awake, not in acute distress Cardiovascular: RRR, S1/S2 +, no rubs, no gallops Respiratory: CTA bilaterally, no wheezing, no rhonchi Abdominal: Soft, NT, ND, bowel sounds + Extremities: no edema, no cyanosis   The results of significant diagnostics from this hospitalization (including imaging, microbiology, ancillary and laboratory) are listed below for reference.    Significant Diagnostic Studies: DG Abdomen 1 View  Result Date: 03/07/2019 CLINICAL DATA:  Constipation EXAM: ABDOMEN - 1 VIEW COMPARISON:  CT renal colic [id number] FINDINGS: No high-grade obstructive bowel gas pattern is seen. No large stool burden. No suspicious calcifications overlying the course of either ureter, renal shadow or gallbladder fossa. Postsurgical changes from prior L4-S1 posterior spinal fusion without evidence of acute hardware complication. Multilevel degenerative changes present in the spine. Redemonstrated T12 and L1 compression deformities, incompletely assessed on frontal only view. Remaining osseous structures are unremarkable. Included lung bases are clear. Cardiomediastinal contours are free of acute Abner IMPRESSION: 1. No high-grade obstructive bowel gas pattern. No large stool burden. No suspicious calcifications. 2. Redemonstrated endplate deformities of T12 and L1, incompletely assessed in the absence of lateral radiograph. Correlate for point tenderness and if present consider dedicated imaging. 3. Postsurgical changes from prior L4-S1 posterior spinal fusion. Electronically Signed   By: Lovena Le M.D.   On: 03/07/2019 00:11   CT Head Wo Contrast  Result Date: 03/07/2019 CLINICAL DATA:  Delirium, altered mental status. EXAM: CT HEAD WITHOUT CONTRAST TECHNIQUE: Contiguous  axial images were obtained from the base of the skull through the vertex without intravenous contrast. COMPARISON:  01/18/2013 FINDINGS: Brain: There is atrophy and chronic small vessel disease changes. No acute intracranial abnormality. Specifically, no hemorrhage, hydrocephalus, mass lesion, acute infarction, or significant intracranial injury. Vascular: No hyperdense vessel or unexpected calcification. Skull: No acute calvarial abnormality. Sinuses/Orbits: Visualized paranasal sinuses and mastoids clear. Orbital soft tissues unremarkable. Other: None IMPRESSION: Atrophy, chronic microvascular disease. No acute intracranial abnormality. Electronically Signed   By: Rolm Baptise M.D.   On: 03/07/2019 03:19   CT Abdomen Pelvis W Contrast  Result Date: 03/07/2019 CLINICAL DATA:  Abdominal distension, minimal urinary catheter output, fall from chair EXAM: CT ABDOMEN AND PELVIS WITH CONTRAST TECHNIQUE: Multidetector CT imaging of the abdomen and pelvis was performed using the standard protocol following bolus administration of intravenous contrast. CONTRAST:  149mL OMNIPAQUE IOHEXOL 300 MG/ML  SOLN COMPARISON:  CT 08/15/2017 FINDINGS: Lower chest: Atelectatic changes noted in the lung bases. Normal heart size. No pericardial effusion. Atherosclerotic calcification of the coronary arteries. Hepatobiliary: No focal liver abnormality is seen. No gallstones, gallbladder wall thickening, or biliary dilatation. Pancreas: Fatty replacement of the pancreas. No pancreatic ductal dilatation or surrounding inflammatory changes. Spleen: Normal in size without focal abnormality. Adrenals/Urinary Tract: Mild nodular thickening of the adrenal glands is similar to the prior without concerning dominant nodule. There is a 9.8 cm fluid attenuation cyst involving the lower pole right kidney. A smaller 2.6 cm fluid attenuation cyst is seen arising from the lower pole left kidney as well. Additional subcentimeter hypoattenuating foci in  both kidneys are too small to fully characterize. There are several nonobstructing collecting system calculi in the lower pole right kidney. There is mild left and moderate right hydroureteronephrosis to the level of the circumferentially thickened bladder without visible  obstructing urolithiasis. The urinary bladder appears circumferentially thickened with perivesicular hazy stranding and some mucosal hyperemia. There is indentation of the posterior bladder base by the enlarged prostate. An inflated Foley catheter is positioned within the prostate itself. Stomach/Bowel: Distal esophagus, stomach and duodenal sweep are unremarkable. No small bowel wall thickening or dilatation. No evidence of obstruction. A normal appendix is visualized. No colonic dilatation or wall thickening. Scattered colonic diverticula without focal pericolonic inflammation to suggest diverticulitis. Vascular/Lymphatic: Extensive atherosclerotic calcification of the aorta and branch vessels. No aneurysm or ectasia. No pathologically enlarged abdominopelvic lymph nodes. Reproductive: The inflated Foley catheter balloon is positioned within the central prostate. There is a partially rim enhancing hypoattenuating collection to the left of the Foley catheter balloon worrisome for developing prostate abscess measuring 2.1 by 1.0 cm in size. Other: Perivesicular inflammatory changes, as detailed above. No free fluid or free air in the abdomen or pelvis. No bowel containing hernias. Mild body wall edema. Postsurgical changes of the posterior midline soft tissues. Small focus of gas in the subcutaneous tissues of the left anterolateral abdominal wall may reflect removal of a neurostimulator device seen on comparison CT. Correlate with surgical history. Musculoskeletal: There is been prior L5-S1 posterior spinal fusion as well as postsurgical decompression of the T8 level. Stable compression deformities of T12 and L1. No acute or suspicious osseous  lesions are seen. IMPRESSION: 1. Inflated Foley catheter balloon is positioned within the prostate with a small hypoattenuating rim enhancing collection in the adjacent prostate tissue worrisome for developing prostate abscess. 2. Circumferentially thickened urinary bladder with perivesicular hazy stranding and mucosal hyperemia. Findings are concerning for cystitis as well. 3. There is mild left and moderate right hydroureteronephrosis to the level of the circumferentially thickened bladder without visible obstructing urolithiasis. Findings could reflect distension secondary to outlet obstruction or severe cystitis. 4. Soft tissue gas in the left anterolateral abdominal wall and edematous changes in the posterior soft tissues of the left flank may be related to removal of a previously seen at neurostimulator battery pack device. Correlate with surgical history. 5. Colonic diverticulosis without evidence of diverticulitis. 6. Coronary artery calcifications. 7. Unchanged compression deformities at T12 and L1. No acute osseous injury. These results were called by telephone at the time of interpretation on 03/07/2019 at 3:29 am to provider IVA KNAPP , who verbally acknowledged these results. Electronically Signed   By: Lovena Le M.D.   On: 03/07/2019 03:30   DG C-Arm 1-60 Min-No Report  Result Date: 03/01/2019 Fluoroscopy was utilized by the requesting physician.  No radiographic interpretation.     Microbiology: Recent Results (from the past 240 hour(s))  SARS CORONAVIRUS 2 ([facility] [place] 6-24 HRS) Nasopharyngeal Nasopharyngeal Swab     Status: None   Collection Time: 03/07/19  1:34 AM   Specimen: Nasopharyngeal Swab  Result Value Ref Range Status   SARS Coronavirus 2 NEGATIVE NEGATIVE Final    Comment: (NOTE) SARS-CoV-2 target nucleic acids are NOT DETECTED. The SARS-CoV-2 RNA is generally detectable in upper and lower respiratory specimens during the acute phase of infection. Negative results do not  preclude SARS-CoV-2 infection, do not rule out co-infections with other pathogens, and should not be used as the sole basis for treatment or other patient management decisions. Negative results must be combined with clinical observations, patient history, and epidemiological information. The expected result is Negative. Fact Sheet for Patients: SugarRoll.be Fact Sheet for Healthcare Providers: https://www.woods-mathews.com/ This test is not yet approved or cleared by the [place] FDA and  has been authorized for detection and/or diagnosis of SARS-CoV-2 by FDA under an Emergency Use Authorization (EUA). This EUA will remain  in effect (meaning this test can be used) for the duration of the COVID-19 declaration under Section 56 4(b)(1) of the Act, 21 U.S.C. section 360bbb-3(b)(1), unless the authorization is terminated or revoked sooner. Performed at [facility], [place] [address], [place], [place] [address]   Urine culture     Status: None   Collection Time: 03/07/19  2:04 AM   Specimen: Urine, Catheterized  Result Value Ref Range Status   Specimen Description   Final    URINE, CATHETERIZED Performed at [facility], [address], [place], [place] [address]    Special Requests   Final    Normal Performed at [facility], [address], [place], [place] [address]    Culture   Final    NO GROWTH Performed at [facility], [place] [address], [place],  [address]    Report Status 03/08/2019 FINAL  Final     Labs: Basic Metabolic Panel: Recent Labs  Lab 03/07/19 0044 03/07/19 0044 03/08/19 0524 03/09/19 0542  NA 137  --  139 141  K 3.7   < > 3.8 3.5  CL 100  --  102 104  CO2 27  --  25 28  GLUCOSE 125*  --  99 112*  BUN 20  --  18 20  CREATININE 1.02  --  0.96 0.93  CALCIUM 9.3  --  9.8 9.6  MG 2.0  --   --   --   PHOS 3.3  --   --   --    < > = values in this interval not displayed.   Liver Function  Tests: Recent Labs  Lab 03/07/19 0044 03/08/19 0524  AST 35 29  ALT 27 23  ALKPHOS 81 74  BILITOT 0.8 0.8  PROT 7.2 7.3  ALBUMIN 3.5 3.5   No results for input(s): LIPASE, AMYLASE in the last 168 hours. Recent Labs  Lab 03/09/19 0542  AMMONIA 10   CBC: Recent Labs  Lab 03/07/19 0044 03/08/19 0524 03/09/19 0542  WBC 7.4 8.5 5.8  NEUTROABS 5.8  --   --   HGB 14.1 13.8 12.9*  HCT 43.2 42.9 40.7  MCV 88.0 89.6 90.4  PLT 266 261 258   Cardiac Enzymes: Recent Labs  Lab 03/07/19 0044  CKTOTAL 207   BNP: Invalid input(s): POCBNP CBG: Recent Labs  Lab 03/08/19 [id number]  GLUCAP 89    Time coordinating discharge:  36 minutes  Signed:  Orson Eva, DO [facility] Pager: [phone] 03/09/2019, 11:20 AM

## 2019-03-13 DIAGNOSIS — G8929 Other chronic pain: Secondary | ICD-10-CM | POA: Diagnosis not present

## 2019-03-13 DIAGNOSIS — R339 Retention of urine, unspecified: Secondary | ICD-10-CM | POA: Diagnosis not present

## 2019-03-13 DIAGNOSIS — G9341 Metabolic encephalopathy: Secondary | ICD-10-CM | POA: Diagnosis not present

## 2019-03-13 DIAGNOSIS — N1339 Other hydronephrosis: Secondary | ICD-10-CM | POA: Diagnosis not present

## 2019-03-17 DIAGNOSIS — G8929 Other chronic pain: Secondary | ICD-10-CM | POA: Diagnosis not present

## 2019-03-17 DIAGNOSIS — R339 Retention of urine, unspecified: Secondary | ICD-10-CM | POA: Diagnosis not present

## 2019-03-17 DIAGNOSIS — G9341 Metabolic encephalopathy: Secondary | ICD-10-CM | POA: Diagnosis not present

## 2019-03-17 DIAGNOSIS — N1339 Other hydronephrosis: Secondary | ICD-10-CM | POA: Diagnosis not present

## 2019-03-22 ENCOUNTER — Ambulatory Visit
Admission: RE | Admit: 2019-03-22 | Discharge: 2019-03-22 | Disposition: A | Discharge status: Home / Self Care | Payer: Medicare Other | Source: Ambulatory Visit | Attending: Neurosurgery | Admitting: Neurosurgery

## 2019-03-22 ENCOUNTER — Other Ambulatory Visit: Payer: Self-pay

## 2019-03-22 DIAGNOSIS — M48061 Spinal stenosis, lumbar region without neurogenic claudication: Secondary | ICD-10-CM | POA: Diagnosis not present

## 2019-03-22 DIAGNOSIS — S22030A Wedge compression fracture of third thoracic vertebra, initial encounter for closed fracture: Secondary | ICD-10-CM | POA: Diagnosis not present

## 2019-03-22 DIAGNOSIS — R29898 Other symptoms and signs involving the musculoskeletal system: Secondary | ICD-10-CM

## 2019-03-22 DIAGNOSIS — G8929 Other chronic pain: Secondary | ICD-10-CM

## 2019-03-22 DIAGNOSIS — M5442 Lumbago with sciatica, left side: Secondary | ICD-10-CM | POA: Insufficient documentation

## 2019-03-22 DIAGNOSIS — M5441 Lumbago with sciatica, right side: Secondary | ICD-10-CM | POA: Diagnosis not present

## 2019-03-22 DIAGNOSIS — G2581 Restless legs syndrome: Secondary | ICD-10-CM | POA: Diagnosis not present

## 2019-03-24 DIAGNOSIS — R338 Other retention of urine: Secondary | ICD-10-CM | POA: Diagnosis not present

## 2019-03-24 DIAGNOSIS — N481 Balanitis: Secondary | ICD-10-CM | POA: Diagnosis not present

## 2019-03-27 ENCOUNTER — Other Ambulatory Visit: Payer: Medicare Other

## 2019-03-27 DIAGNOSIS — I10 Essential (primary) hypertension: Secondary | ICD-10-CM | POA: Diagnosis not present

## 2019-03-27 DIAGNOSIS — T85192S Other mechanical complication of implanted electronic neurostimulator (electrode) of spinal cord, sequela: Secondary | ICD-10-CM | POA: Diagnosis not present

## 2019-03-27 DIAGNOSIS — R339 Retention of urine, unspecified: Secondary | ICD-10-CM | POA: Diagnosis not present

## 2019-03-27 DIAGNOSIS — R29898 Other symptoms and signs involving the musculoskeletal system: Secondary | ICD-10-CM | POA: Diagnosis not present

## 2019-03-29 DIAGNOSIS — R338 Other retention of urine: Secondary | ICD-10-CM | POA: Diagnosis not present

## 2019-03-29 DIAGNOSIS — R3915 Urgency of urination: Secondary | ICD-10-CM | POA: Diagnosis not present

## 2019-03-29 DIAGNOSIS — R350 Frequency of micturition: Secondary | ICD-10-CM | POA: Diagnosis not present

## 2019-04-01 DIAGNOSIS — M549 Dorsalgia, unspecified: Secondary | ICD-10-CM | POA: Diagnosis not present

## 2019-04-01 DIAGNOSIS — Z466 Encounter for fitting and adjustment of urinary device: Secondary | ICD-10-CM | POA: Diagnosis not present

## 2019-04-01 DIAGNOSIS — S61512D Laceration without foreign body of left wrist, subsequent encounter: Secondary | ICD-10-CM | POA: Diagnosis not present

## 2019-04-01 DIAGNOSIS — I10 Essential (primary) hypertension: Secondary | ICD-10-CM | POA: Diagnosis not present

## 2019-04-01 DIAGNOSIS — T85890D Other specified complication of nervous system prosthetic devices, implants and grafts, subsequent encounter: Secondary | ICD-10-CM | POA: Diagnosis not present

## 2019-04-01 DIAGNOSIS — N411 Chronic prostatitis: Secondary | ICD-10-CM | POA: Diagnosis not present

## 2019-04-01 DIAGNOSIS — E785 Hyperlipidemia, unspecified: Secondary | ICD-10-CM | POA: Diagnosis not present

## 2019-04-01 DIAGNOSIS — R339 Retention of urine, unspecified: Secondary | ICD-10-CM | POA: Diagnosis not present

## 2019-04-01 DIAGNOSIS — G894 Chronic pain syndrome: Secondary | ICD-10-CM | POA: Diagnosis not present

## 2019-04-01 DIAGNOSIS — Z79891 Long term (current) use of opiate analgesic: Secondary | ICD-10-CM | POA: Diagnosis not present

## 2019-04-01 DIAGNOSIS — G629 Polyneuropathy, unspecified: Secondary | ICD-10-CM | POA: Diagnosis not present

## 2019-04-01 DIAGNOSIS — R202 Paresthesia of skin: Secondary | ICD-10-CM | POA: Diagnosis not present

## 2019-04-02 ENCOUNTER — Ambulatory Visit: Payer: Medicare Other | Attending: Internal Medicine

## 2019-04-02 DIAGNOSIS — Z23 Encounter for immunization: Secondary | ICD-10-CM | POA: Insufficient documentation

## 2019-04-02 NOTE — Progress Notes (Signed)
   Covid-19 Vaccination Clinic  Name:  Luke Foster    MRN: [id number] DOB: [birth year]  04/02/2019  [name] was observed post Covid-19 immunization for 15 minutes without incidence. He was provided with Vaccine Information Sheet and instruction to access the V-Safe system.   [name] was instructed to call 911 with any severe reactions post vaccine: Marland Kitchen Difficulty breathing  . Swelling of your face and throat  . A fast heartbeat  . A bad rash all over your body  . Dizziness and weakness    Immunizations Administered    Name Date Dose VIS Date Route   Pfizer COVID-19 Vaccine 04/02/2019  3:28 PM 0.3 mL 01/20/2019 Intramuscular   Manufacturer: [place]   Lot: [id number]   [place]: [id number]

## 2019-04-05 DIAGNOSIS — R339 Retention of urine, unspecified: Secondary | ICD-10-CM | POA: Diagnosis not present

## 2019-04-05 DIAGNOSIS — Z466 Encounter for fitting and adjustment of urinary device: Secondary | ICD-10-CM | POA: Diagnosis not present

## 2019-04-05 DIAGNOSIS — S61512D Laceration without foreign body of left wrist, subsequent encounter: Secondary | ICD-10-CM | POA: Diagnosis not present

## 2019-04-05 DIAGNOSIS — E785 Hyperlipidemia, unspecified: Secondary | ICD-10-CM | POA: Diagnosis not present

## 2019-04-05 DIAGNOSIS — R202 Paresthesia of skin: Secondary | ICD-10-CM | POA: Diagnosis not present

## 2019-04-05 DIAGNOSIS — G894 Chronic pain syndrome: Secondary | ICD-10-CM | POA: Diagnosis not present

## 2019-04-05 DIAGNOSIS — N411 Chronic prostatitis: Secondary | ICD-10-CM | POA: Diagnosis not present

## 2019-04-05 DIAGNOSIS — Z79891 Long term (current) use of opiate analgesic: Secondary | ICD-10-CM | POA: Diagnosis not present

## 2019-04-05 DIAGNOSIS — M549 Dorsalgia, unspecified: Secondary | ICD-10-CM | POA: Diagnosis not present

## 2019-04-05 DIAGNOSIS — T85890D Other specified complication of nervous system prosthetic devices, implants and grafts, subsequent encounter: Secondary | ICD-10-CM | POA: Diagnosis not present

## 2019-04-05 DIAGNOSIS — I10 Essential (primary) hypertension: Secondary | ICD-10-CM | POA: Diagnosis not present

## 2019-04-05 DIAGNOSIS — G629 Polyneuropathy, unspecified: Secondary | ICD-10-CM | POA: Diagnosis not present

## 2019-04-06 DIAGNOSIS — I700 Atherosclerosis of aorta: Secondary | ICD-10-CM | POA: Diagnosis not present

## 2019-04-06 DIAGNOSIS — I10 Essential (primary) hypertension: Secondary | ICD-10-CM | POA: Diagnosis not present

## 2019-04-06 DIAGNOSIS — R9431 Abnormal electrocardiogram [ECG] [EKG]: Secondary | ICD-10-CM | POA: Diagnosis not present

## 2019-04-07 DIAGNOSIS — N411 Chronic prostatitis: Secondary | ICD-10-CM | POA: Diagnosis not present

## 2019-04-07 DIAGNOSIS — R339 Retention of urine, unspecified: Secondary | ICD-10-CM | POA: Diagnosis not present

## 2019-04-07 DIAGNOSIS — I10 Essential (primary) hypertension: Secondary | ICD-10-CM | POA: Diagnosis not present

## 2019-04-07 DIAGNOSIS — E785 Hyperlipidemia, unspecified: Secondary | ICD-10-CM | POA: Diagnosis not present

## 2019-04-07 DIAGNOSIS — T85890D Other specified complication of nervous system prosthetic devices, implants and grafts, subsequent encounter: Secondary | ICD-10-CM | POA: Diagnosis not present

## 2019-04-07 DIAGNOSIS — Z466 Encounter for fitting and adjustment of urinary device: Secondary | ICD-10-CM | POA: Diagnosis not present

## 2019-04-07 DIAGNOSIS — R202 Paresthesia of skin: Secondary | ICD-10-CM | POA: Diagnosis not present

## 2019-04-07 DIAGNOSIS — S61512D Laceration without foreign body of left wrist, subsequent encounter: Secondary | ICD-10-CM | POA: Diagnosis not present

## 2019-04-07 DIAGNOSIS — G894 Chronic pain syndrome: Secondary | ICD-10-CM | POA: Diagnosis not present

## 2019-04-07 DIAGNOSIS — G629 Polyneuropathy, unspecified: Secondary | ICD-10-CM | POA: Diagnosis not present

## 2019-04-07 DIAGNOSIS — Z79891 Long term (current) use of opiate analgesic: Secondary | ICD-10-CM | POA: Diagnosis not present

## 2019-04-07 DIAGNOSIS — M549 Dorsalgia, unspecified: Secondary | ICD-10-CM | POA: Diagnosis not present

## 2019-04-09 DIAGNOSIS — I10 Essential (primary) hypertension: Secondary | ICD-10-CM | POA: Diagnosis not present

## 2019-04-09 DIAGNOSIS — G894 Chronic pain syndrome: Secondary | ICD-10-CM | POA: Diagnosis not present

## 2019-04-11 DIAGNOSIS — R339 Retention of urine, unspecified: Secondary | ICD-10-CM | POA: Diagnosis not present

## 2019-04-11 DIAGNOSIS — T85890D Other specified complication of nervous system prosthetic devices, implants and grafts, subsequent encounter: Secondary | ICD-10-CM | POA: Diagnosis not present

## 2019-04-11 DIAGNOSIS — Z79891 Long term (current) use of opiate analgesic: Secondary | ICD-10-CM | POA: Diagnosis not present

## 2019-04-11 DIAGNOSIS — I10 Essential (primary) hypertension: Secondary | ICD-10-CM | POA: Diagnosis not present

## 2019-04-11 DIAGNOSIS — Z466 Encounter for fitting and adjustment of urinary device: Secondary | ICD-10-CM | POA: Diagnosis not present

## 2019-04-11 DIAGNOSIS — N411 Chronic prostatitis: Secondary | ICD-10-CM | POA: Diagnosis not present

## 2019-04-11 DIAGNOSIS — G629 Polyneuropathy, unspecified: Secondary | ICD-10-CM | POA: Diagnosis not present

## 2019-04-11 DIAGNOSIS — E785 Hyperlipidemia, unspecified: Secondary | ICD-10-CM | POA: Diagnosis not present

## 2019-04-11 DIAGNOSIS — G894 Chronic pain syndrome: Secondary | ICD-10-CM | POA: Diagnosis not present

## 2019-04-11 DIAGNOSIS — M549 Dorsalgia, unspecified: Secondary | ICD-10-CM | POA: Diagnosis not present

## 2019-04-11 DIAGNOSIS — R202 Paresthesia of skin: Secondary | ICD-10-CM | POA: Diagnosis not present

## 2019-04-11 DIAGNOSIS — S61512D Laceration without foreign body of left wrist, subsequent encounter: Secondary | ICD-10-CM | POA: Diagnosis not present

## 2019-04-13 DIAGNOSIS — R202 Paresthesia of skin: Secondary | ICD-10-CM | POA: Diagnosis not present

## 2019-04-13 DIAGNOSIS — G629 Polyneuropathy, unspecified: Secondary | ICD-10-CM | POA: Diagnosis not present

## 2019-04-13 DIAGNOSIS — T85890D Other specified complication of nervous system prosthetic devices, implants and grafts, subsequent encounter: Secondary | ICD-10-CM | POA: Diagnosis not present

## 2019-04-13 DIAGNOSIS — E785 Hyperlipidemia, unspecified: Secondary | ICD-10-CM | POA: Diagnosis not present

## 2019-04-13 DIAGNOSIS — R339 Retention of urine, unspecified: Secondary | ICD-10-CM | POA: Diagnosis not present

## 2019-04-13 DIAGNOSIS — Z79891 Long term (current) use of opiate analgesic: Secondary | ICD-10-CM | POA: Diagnosis not present

## 2019-04-13 DIAGNOSIS — G894 Chronic pain syndrome: Secondary | ICD-10-CM | POA: Diagnosis not present

## 2019-04-13 DIAGNOSIS — S61512D Laceration without foreign body of left wrist, subsequent encounter: Secondary | ICD-10-CM | POA: Diagnosis not present

## 2019-04-13 DIAGNOSIS — Z466 Encounter for fitting and adjustment of urinary device: Secondary | ICD-10-CM | POA: Diagnosis not present

## 2019-04-13 DIAGNOSIS — M549 Dorsalgia, unspecified: Secondary | ICD-10-CM | POA: Diagnosis not present

## 2019-04-13 DIAGNOSIS — I10 Essential (primary) hypertension: Secondary | ICD-10-CM | POA: Diagnosis not present

## 2019-04-13 DIAGNOSIS — N411 Chronic prostatitis: Secondary | ICD-10-CM | POA: Diagnosis not present

## 2019-04-14 DIAGNOSIS — R350 Frequency of micturition: Secondary | ICD-10-CM | POA: Diagnosis not present

## 2019-04-14 DIAGNOSIS — R3914 Feeling of incomplete bladder emptying: Secondary | ICD-10-CM | POA: Diagnosis not present

## 2019-04-18 DIAGNOSIS — Z79891 Long term (current) use of opiate analgesic: Secondary | ICD-10-CM | POA: Diagnosis not present

## 2019-04-18 DIAGNOSIS — S61512D Laceration without foreign body of left wrist, subsequent encounter: Secondary | ICD-10-CM | POA: Diagnosis not present

## 2019-04-18 DIAGNOSIS — R202 Paresthesia of skin: Secondary | ICD-10-CM | POA: Diagnosis not present

## 2019-04-18 DIAGNOSIS — E785 Hyperlipidemia, unspecified: Secondary | ICD-10-CM | POA: Diagnosis not present

## 2019-04-18 DIAGNOSIS — R339 Retention of urine, unspecified: Secondary | ICD-10-CM | POA: Diagnosis not present

## 2019-04-18 DIAGNOSIS — Z466 Encounter for fitting and adjustment of urinary device: Secondary | ICD-10-CM | POA: Diagnosis not present

## 2019-04-18 DIAGNOSIS — T85890D Other specified complication of nervous system prosthetic devices, implants and grafts, subsequent encounter: Secondary | ICD-10-CM | POA: Diagnosis not present

## 2019-04-18 DIAGNOSIS — G629 Polyneuropathy, unspecified: Secondary | ICD-10-CM | POA: Diagnosis not present

## 2019-04-18 DIAGNOSIS — N411 Chronic prostatitis: Secondary | ICD-10-CM | POA: Diagnosis not present

## 2019-04-18 DIAGNOSIS — M549 Dorsalgia, unspecified: Secondary | ICD-10-CM | POA: Diagnosis not present

## 2019-04-18 DIAGNOSIS — G894 Chronic pain syndrome: Secondary | ICD-10-CM | POA: Diagnosis not present

## 2019-04-18 DIAGNOSIS — I10 Essential (primary) hypertension: Secondary | ICD-10-CM | POA: Diagnosis not present

## 2019-04-20 DIAGNOSIS — M549 Dorsalgia, unspecified: Secondary | ICD-10-CM | POA: Diagnosis not present

## 2019-04-20 DIAGNOSIS — G894 Chronic pain syndrome: Secondary | ICD-10-CM | POA: Diagnosis not present

## 2019-04-20 DIAGNOSIS — E785 Hyperlipidemia, unspecified: Secondary | ICD-10-CM | POA: Diagnosis not present

## 2019-04-20 DIAGNOSIS — S61512D Laceration without foreign body of left wrist, subsequent encounter: Secondary | ICD-10-CM | POA: Diagnosis not present

## 2019-04-20 DIAGNOSIS — Z79891 Long term (current) use of opiate analgesic: Secondary | ICD-10-CM | POA: Diagnosis not present

## 2019-04-20 DIAGNOSIS — T85890D Other specified complication of nervous system prosthetic devices, implants and grafts, subsequent encounter: Secondary | ICD-10-CM | POA: Diagnosis not present

## 2019-04-20 DIAGNOSIS — R202 Paresthesia of skin: Secondary | ICD-10-CM | POA: Diagnosis not present

## 2019-04-20 DIAGNOSIS — R339 Retention of urine, unspecified: Secondary | ICD-10-CM | POA: Diagnosis not present

## 2019-04-20 DIAGNOSIS — I10 Essential (primary) hypertension: Secondary | ICD-10-CM | POA: Diagnosis not present

## 2019-04-20 DIAGNOSIS — N411 Chronic prostatitis: Secondary | ICD-10-CM | POA: Diagnosis not present

## 2019-04-20 DIAGNOSIS — G629 Polyneuropathy, unspecified: Secondary | ICD-10-CM | POA: Diagnosis not present

## 2019-04-20 DIAGNOSIS — Z466 Encounter for fitting and adjustment of urinary device: Secondary | ICD-10-CM | POA: Diagnosis not present

## 2019-04-25 ENCOUNTER — Ambulatory Visit: Payer: Medicare Other | Attending: Internal Medicine

## 2019-04-25 DIAGNOSIS — Z23 Encounter for immunization: Secondary | ICD-10-CM

## 2019-04-25 NOTE — Progress Notes (Signed)
   Covid-19 Vaccination Clinic  Name:  Luke Foster    MRN: [id number] DOB: Nov 11, 1950  04/25/2019  [name] was observed post Covid-19 immunization for 15 minutes without incident. He was provided with Vaccine Information Sheet and instruction to access the V-Safe system.   [name] was instructed to call 911 with any severe reactions post vaccine: Marland Kitchen Difficulty breathing  . Swelling of face and throat  . A fast heartbeat  . A bad rash all over body  . Dizziness and weakness   Immunizations Administered    Name Date Dose VIS Date Route   Pfizer COVID-19 Vaccine 04/25/2019 10:46 AM 0.3 mL 01/20/2019 Intramuscular   Manufacturer: [place]   Lot: [id number]   [place]: [id number]

## 2019-04-26 ENCOUNTER — Emergency Department (HOSPITAL_COMMUNITY)
Admission: EM | Admit: 2019-04-26 | Discharge: 2019-04-26 | Disposition: A | Discharge status: Home / Self Care | Payer: Medicare Other | Attending: Emergency Medicine | Admitting: Emergency Medicine

## 2019-04-26 ENCOUNTER — Emergency Department (HOSPITAL_COMMUNITY): Payer: Medicare Other

## 2019-04-26 ENCOUNTER — Encounter (HOSPITAL_COMMUNITY): Payer: Self-pay | Admitting: Emergency Medicine

## 2019-04-26 ENCOUNTER — Other Ambulatory Visit: Payer: Self-pay

## 2019-04-26 DIAGNOSIS — R5381 Other malaise: Secondary | ICD-10-CM | POA: Diagnosis not present

## 2019-04-26 DIAGNOSIS — Z743 Need for continuous supervision: Secondary | ICD-10-CM | POA: Diagnosis not present

## 2019-04-26 DIAGNOSIS — R0902 Hypoxemia: Secondary | ICD-10-CM | POA: Diagnosis not present

## 2019-04-26 DIAGNOSIS — R29898 Other symptoms and signs involving the musculoskeletal system: Secondary | ICD-10-CM | POA: Diagnosis not present

## 2019-04-26 DIAGNOSIS — R531 Weakness: Secondary | ICD-10-CM | POA: Diagnosis not present

## 2019-04-26 DIAGNOSIS — Z87891 Personal history of nicotine dependence: Secondary | ICD-10-CM | POA: Insufficient documentation

## 2019-04-26 DIAGNOSIS — N3090 Cystitis, unspecified without hematuria: Secondary | ICD-10-CM | POA: Diagnosis not present

## 2019-04-26 DIAGNOSIS — I10 Essential (primary) hypertension: Secondary | ICD-10-CM | POA: Diagnosis not present

## 2019-04-26 DIAGNOSIS — R609 Edema, unspecified: Secondary | ICD-10-CM | POA: Diagnosis not present

## 2019-04-26 DIAGNOSIS — R600 Localized edema: Secondary | ICD-10-CM | POA: Insufficient documentation

## 2019-04-26 LAB — CBC WITH DIFFERENTIAL/PLATELET
Abs Immature Granulocytes: 0.03 10*3/uL (ref 0.00–0.07)
Basophils Absolute: 0.1 10*3/uL (ref 0.0–0.1)
Basophils Relative: 1 %
Eosinophils Absolute: 0.1 10*3/uL (ref 0.0–0.5)
Eosinophils Relative: 1 %
HCT: 42.1 % (ref 39.0–52.0)
Hemoglobin: 13.4 g/dL (ref 13.0–17.0)
Immature Granulocytes: 0 %
Lymphocytes Relative: 13 %
Lymphs Abs: 1.1 10*3/uL (ref 0.7–4.0)
MCH: 29.2 pg (ref 26.0–34.0)
MCHC: 31.8 g/dL (ref 30.0–36.0)
MCV: 91.7 fL (ref 80.0–100.0)
Monocytes Absolute: 0.7 10*3/uL (ref 0.1–1.0)
Monocytes Relative: 8 %
Neutro Abs: 6.8 10*3/uL (ref 1.7–7.7)
Neutrophils Relative %: 77 %
Platelets: 231 10*3/uL (ref 150–400)
RBC: 4.59 MIL/uL (ref 4.22–5.81)
RDW: 12.8 % (ref 11.5–15.5)
WBC: 8.8 10*3/uL (ref 4.0–10.5)
nRBC: 0 % (ref 0.0–0.2)

## 2019-04-26 LAB — URINALYSIS, ROUTINE W REFLEX MICROSCOPIC
Bacteria, UA: NONE SEEN
Bilirubin Urine: NEGATIVE
Glucose, UA: NEGATIVE mg/dL
Ketones, ur: NEGATIVE mg/dL
Nitrite: NEGATIVE
Protein, ur: NEGATIVE mg/dL
Specific Gravity, Urine: 1.015 (ref 1.005–1.030)
WBC, UA: >50 WBC/hpf — ABNORMAL HIGH (ref 0–5)
pH: 7 (ref 5.0–8.0)

## 2019-04-26 LAB — COMPREHENSIVE METABOLIC PANEL
ALT: 14 U/L (ref 0–44)
AST: 17 U/L (ref 15–41)
Albumin: 3.6 g/dL (ref 3.5–5.0)
Alkaline Phosphatase: 121 U/L (ref 38–126)
Anion gap: 7 (ref 5–15)
BUN: 18 mg/dL (ref 8–23)
CO2: 26 mmol/L (ref 22–32)
Calcium: 9.2 mg/dL (ref 8.9–10.3)
Chloride: 104 mmol/L (ref 98–111)
Creatinine, Ser: 1.17 mg/dL (ref 0.61–1.24)
GFR calc Af Amer: >60 mL/min (ref >60)
GFR calc non Af Amer: >60 mL/min (ref >60)
Glucose, Bld: 91 mg/dL (ref 70–99)
Potassium: 3.7 mmol/L (ref 3.5–5.1)
Sodium: 137 mmol/L (ref 135–145)
Total Bilirubin: 0.4 mg/dL (ref 0.3–1.2)
Total Protein: 7.3 g/dL (ref 6.5–8.1)

## 2019-04-26 LAB — BRAIN NATRIURETIC PEPTIDE: B Natriuretic Peptide: 37 pg/mL (ref 0.0–100.0)

## 2019-04-26 LAB — TROPONIN I (HIGH SENSITIVITY): Troponin I (High Sensitivity): 4 ng/L (ref <18)

## 2019-04-26 MED ORDER — CIPROFLOXACIN HCL 500 MG PO TABS: 500.0000 mg | ORAL_TABLET | Freq: Two times a day (BID) | ORAL | 0 refills | Status: AC

## 2019-04-26 NOTE — ED Triage Notes (Signed)
Per EMS patient from home. States bilateral lower extremity edema and is unable to walk due to edema. Denies shortness of breath.

## 2019-04-26 NOTE — ED Provider Notes (Signed)
[facility] EMERGENCY DEPARTMENT Provider Note   CSN: [id number] Arrival date & time: 04/26/19  1658     History Chief Complaint  Patient presents with  . Weakness  . Leg Swelling    PACER WEHR is a 69 y.o. male.  HPI HPI Comments: EMERSEN BARCENA is a 69 y.o. male who presents to the Emergency Department via EMS complaining of worsening weakness and bilateral lower extremity edema.  Patient was admitted in January of this year for urinary retention.  He had complications with his Foley catheter and he developed bilateral hydronephrosis as well as a UTI.  He was ultimately discharged and states that he has been experiencing frequent incontinence, fatigue, weakness since his visit.  His leg swelling is chronic but he states it is acutely worsened in the last couple days to the point that he is having difficulty ambulating throughout his home.  He ambulates with a walker at baseline.  He reports a fall yesterday in which he tripped while ambulating with his walker and landed on his left face.  He has mild tenderness and bruising noted just lateral to the left orbit.  He denies any fevers, chills, URI symptoms, chest pain, shortness of breath, abdominal pain, nausea, vomiting, diarrhea, dysuria, hematuria, syncope, dizziness.    Past Medical History:  Diagnosis Date  . Arthritis   . Chronic back pain   . Chronic pain 1999   Bilateral feet (R >L)  . Difficult or painful urination   . High blood pressure   . History of kidney stones    H/O  . Nerve damage   . Pain management   . Paresthesia of foot, bilateral   . Sleep apnea    USES CPAP  . Weakness of both legs     Patient Active Problem List   Diagnosis Date Noted  . Acute metabolic encephalopathy [id number]  . Bilateral hydronephrosis 03/07/2019  . Altered mental status 03/07/2019  . Hypertension   . Neuropathy 01/21/2017  . Pain in right foot 01/21/2017  . Pain in left foot 01/21/2017  . Chronic pain syndrome  01/21/2017  . Post laminectomy syndrome 01/21/2017  . Status post insertion of spinal cord stimulator 01/21/2017  . Uncomplicated opioid dependence ([place]) 01/21/2017  . Lumbosacral radiculopathy 12/09/2012    Past Surgical History:  Procedure Laterality Date  . arm surgery    . BACK SURGERY     X2  . FACIAL COSMETIC SURGERY    . HERNIA REPAIR    . IR US GUIDE BX ASP/DRAIN  10/19/2017  . KIDNEY STONE SURGERY    . LUMBAR SPINAL CORD SIMULATOR LEAD REMOVAL N/A 03/01/2019   Procedure: LUMBAR SPINAL CORD SIMULATOR LEAD REMOVAL;  Surgeon: Meade Maw, MD;  Location: [facility] ORS;  Service: Neurosurgery;  Laterality: N/A;  . MASTECTOMY Left   . SPINAL CORD STIMULATOR REMOVAL N/A 03/01/2019   Procedure: LUMBAR SPINAL CORD STIMULATOR REMOVAL;  Surgeon: Meade Maw, MD;  Location: [facility] ORS;  Service: Neurosurgery;  Laterality: N/A;       Family History  Problem Relation Age of Onset  . Heart disease Father        Living, 75  . Breast cancer Mother        Living, 2  . Hypercholesterolemia Mother   . Healthy Brother   . Healthy Sister     Social History   Tobacco Use  . Smoking status: Former Smoker    Packs/day: 2.00    Years: 10.00  Pack years: 20.00    Types: Cigarettes    Quit date: 05/21/1991    Years since quitting: 27.9  . Smokeless tobacco: Former Systems developer    Quit date: 06/08/1991  Substance Use Topics  . Alcohol use: No  . Drug use: No    Home Medications Prior to Admission medications   Medication Sig Start Date End Date Taking? Authorizing Provider  cefdinir (OMNICEF) 300 MG capsule Take 1 capsule (300 mg total) by mouth every 12 (twelve) hours. X 2 days 03/09/19   Orson Eva, MD  cyclobenzaprine (FLEXERIL) 10 MG tablet Take 10 mg by mouth 3 (three) times daily as needed for muscle spasms.    [provider]  diltiazem (CARDIZEM CD) 120 MG 24 hr capsule Take 120 mg by mouth every evening.  09/26/18   [provider]  enalapril (VASOTEC) 5  MG tablet Take 5 mg by mouth 2 (two) times daily.  05/20/16   [provider]  lidocaine (LIDODERM) 5 % Place 1 patch onto the skin daily as needed (pain.). Remove & Discard patch within 12 hours or as directed by MD     [provider]  oxybutynin (DITROPAN) 5 MG tablet Take 1 tablet (5 mg total) by mouth 3 (three) times daily. X 5 days 03/09/19   Orson Eva, MD  Oxycodone HCl 10 MG TABS Take 1 tablet (10 mg total) by mouth every 6 (six) hours as needed for moderate pain or severe pain. 03/09/19   Orson Eva, MD  tamsulosin (FLOMAX) 0.4 MG CAPS capsule Take 0.4 mg by mouth every evening.     [provider]  venlafaxine XR (EFFEXOR-XR) 75 MG 24 hr capsule Take 75 mg by mouth 3 (three) times daily.  12/31/18   [provider]  vitamin B-12 (CYANOCOBALAMIN) 500 MCG tablet Take 1 tablet (500 mcg total) by mouth daily. 03/10/19   Orson Eva, MD    Allergies    Erythromycin  Review of Systems   Review of Systems  Constitutional: Positive for activity change and fatigue. Negative for appetite change, chills and fever.  HENT: Negative for congestion and rhinorrhea.   Respiratory: Negative for cough and shortness of breath.   Cardiovascular: Positive for leg swelling. Negative for chest pain.  Gastrointestinal: Negative for abdominal pain, diarrhea, nausea and vomiting.  Genitourinary: Positive for frequency and urgency (Urinary incontinence). Negative for difficulty urinating, dysuria and hematuria.  Musculoskeletal: Positive for back pain (Chronic).  Skin: Positive for color change (Ecchymosis to the left lateral orbit).  Neurological: Positive for weakness. Negative for syncope.  All other systems reviewed and are negative.   Physical Exam Updated Vital Signs BP (!) 141/84   Pulse 84   Temp 98 F (36.7 C) (Oral)   Resp (!) 24   Ht 5\' 7"  (1.702 m)   Wt 90.7 kg   SpO2 100%   BMI 31.32 kg/m   Physical Exam Constitutional:      General: He is not in  acute distress.    Appearance: Normal appearance. He is normal weight. He is not ill-appearing, toxic-appearing or diaphoretic.     Comments: Patient is an elderly 69 year old Caucasian male.  He answers questions clearly but at times does have difficulty eliciting clear answers.  He has a history of chronic pain and has difficulty with basic movements.  HENT:     Head: Normocephalic.     Comments: Mild tenderness and ecchymosis noted to the left lateral orbit.  No significant edema.  Extraocular  movements intact.    Right Ear: External ear normal.     Left Ear: External ear normal.     Nose: Nose normal.     Mouth/Throat:     Mouth: Mucous membranes are moist.     Pharynx: Oropharynx is clear. No oropharyngeal exudate or posterior oropharyngeal erythema.  Eyes:     General: No scleral icterus.       Right eye: No discharge.        Left eye: No discharge.     Extraocular Movements: Extraocular movements intact.     Pupils: Pupils are equal, round, and reactive to light.  Cardiovascular:     Rate and Rhythm: Normal rate and regular rhythm.     Pulses: Normal pulses.     Heart sounds: No murmur. No friction rub. No gallop.      Comments: Palpable pedal pulses.  Reduced hair growth in the distal extremities.  Feet are cool to the touch.  Good cap refill. Pulmonary:     Effort: Pulmonary effort is normal. No respiratory distress.     Breath sounds: Normal breath sounds. No stridor. No wheezing, rhonchi or rales.     Comments: Lungs clear to auscultation bilaterally. Abdominal:     General: Abdomen is flat. There is no distension.     Palpations: Abdomen is soft. There is no mass.     Tenderness: There is no abdominal tenderness. There is no guarding or rebound.     Hernia: No hernia is present.  Musculoskeletal:     Cervical back: Normal range of motion. No rigidity.     Right lower leg: Edema present.     Left lower leg: Edema present.     Comments: 2+ nonpitting edema noted in the  bilateral lower extremities.  No erythema or signs of cellulitis.  Diffuse onychomycosis noted on the bilateral feet.  Skin:    General: Skin is warm and dry.     Capillary Refill: Capillary refill takes 2 to 3 seconds.  Neurological:     General: No focal deficit present.     Mental Status: He is alert and oriented to person, place, and time.     Comments: Finger-to-nose intact in the bilateral upper extremities.  Patient able to move all extremities spontaneously.  Movement limited secondary to pain and edema.  Patient is A&O x 3.  Psychiatric:        Mood and Affect: Mood normal.        Behavior: Behavior normal.    ED Results / Procedures / Treatments   Labs (all labs ordered are listed, but only abnormal results are displayed) Labs Reviewed  URINALYSIS, ROUTINE W REFLEX MICROSCOPIC - Abnormal; Notable for the following components:      Result Value   APPearance HAZY (*)    Hgb urine dipstick SMALL (*)    Leukocytes,Ua LARGE (*)    WBC, UA >50 (*)    All other components within normal limits  URINE CULTURE  COMPREHENSIVE METABOLIC PANEL  CBC WITH DIFFERENTIAL/PLATELET  BRAIN NATRIURETIC PEPTIDE  TROPONIN I (HIGH SENSITIVITY)   EKG EKG Interpretation  Date/Time:  Wednesday April 26 2019 17:15:02 EDT Ventricular Rate:  83 PR Interval:    QRS Duration: 139 QT Interval:  396 QTC Calculation: 466 R Axis:   80 Text Interpretation: Sinus rhythm Right bundle branch block Borderline ST elevation, lateral leads Baseline wander in lead(s) V2 Since last tracing rate faster Confirmed by Noemi Chapel [phone]) on 04/26/2019 5:30:45 PM  Radiology DG Chest Portable 1 View  Result Date: 04/26/2019 CLINICAL DATA:  Worsening lower extremity edema EXAM: PORTABLE CHEST 1 VIEW COMPARISON:  01/28/2015 chest radiograph. FINDINGS: Stable cardiomediastinal silhouette with normal heart size. No pneumothorax. No pleural effusion. Lungs appear clear, with no acute consolidative airspace disease  and no pulmonary edema. IMPRESSION: No active disease. Electronically Signed   By: Ilona Sorrel M.D.   On: 04/26/2019 18:40    Procedures Procedures (including critical care time)  Medications Ordered in ED Medications - No data to display  ED Course  I have reviewed the triage vital signs and the nursing notes.  Pertinent labs & imaging results that were available during my care of the patient were reviewed by me and considered in my medical decision making (see chart for details).    MDM Rules/Calculators/A&P                      5:58 PM patient is a pleasant 69 year old Caucasian male.  He presents today with worsening weakness, fatigue, bilateral lower extremity edema.  Physical exam is significant for 2+ nonpitting edema in the bilateral lower extremities as well as generalized weakness.  Patient did have a fall yesterday while ambulating with his walker and he struck his left face.  There is mild bruising at the site.  His neurological exam is benign.  He was also recently admitted in January for urinary retention and has been experiencing frequent urinary incontinence since this visit.  Records do not indicate that he is taking diuretics or calcium channel blockers currently.  Records indicate he had an echocardiogram completed on February 25 at [facility] which showed left ventricular ejection fraction of 55%.  Will obtain basic labs, UA, chest x-ray.  Will reassess.  6:52 PM patient discussed with and evaluated by my attending physician Dr. Noemi Chapel.  He agrees with the above plan.  If labs are reassuring, will discharge with instructions for outpatient ultrasound of the lower extremities.   9:10 PM patient shows leukocytes elevated white blood cells on urinalysis, otherwise lab results are reassuring.  No elevation in troponin or BNP.  CBC and CMP show no acute pathology.  Discussed this with patient.  He has a follow-up appointment with neurology tomorrow and another with primary care  in 1 week.  I urged him to keep these appointments.  He understands to call and schedule an ultrasound of his bilateral lower extremities to rule out DVT.  Will place patient on 10 days of ciprofloxacin for acute cystitis.  Strict return precautions given.  He understands to return with any new or worsening symptoms.  He verbalized understanding of the above plan.  Patient was amicable at time of discharge.  Vital signs stable.   Final Clinical Impression(s) / ED Diagnoses Final diagnoses:  Bilateral lower extremity edema  Weakness  Cystitis    Rx / DC Orders ED Discharge Orders         Ordered    US Venous Img Lower Bilateral     04/26/19 2105    ciprofloxacin (CIPRO) 500 MG tablet  2 times daily     04/26/19 2110           Rayna Sexton, PA-C 04/26/19 2147    Noemi Chapel, MD 04/27/19 2306

## 2019-04-26 NOTE — ED Notes (Signed)
Loran Senters applied for urine sample,

## 2019-04-26 NOTE — ED Notes (Signed)
Patient states he had his 2nd [place] yesterday.

## 2019-04-26 NOTE — Discharge Instructions (Addendum)
Please call the number enclosed to schedule an ultrasound.  This imaging is to determine whether or not you have a blood clot in one or both of your lower extremities.  Please be sure to follow-up with your primary care provider after this procedure to discuss this visit.  I am also placing you on an antibiotic for a UTI.  Please be sure to take this antibiotic in its entirety.  Please do not hesitate to return to the emergency department with any new or worsening symptoms.

## 2019-04-26 NOTE — ED Provider Notes (Signed)
This patient is a 69 year old male, history of nerve damage, has some difficulty walking walks with a walker, fell yesterday bumping the left side of his head but has no concern, no headache, no loss consciousness.  Presents today with swelling of the legs which is gotten worse over the last 2 days.  He generally does not have swelling, no history of heart liver or kidney disease, not on any medicines that would make him swell.  On exam has 1-2+ pitting edema bilaterally, symmetrical, good pulses, normal capillary refill, warm extremities.  No redness, no cellulitis, no shortness of breath and clear lung sounds.  Rule out sources of fluid retention including liver kidney or heart failure.  This may be a reaction to the vaccine, the patient is otherwise well-appearing.  May need ultrasound tomorrow if negative work-up here.  Medical screening examination/treatment/procedure(s) were conducted as a shared visit with non-physician practitioner(s) and myself.  I personally evaluated the patient during the encounter.  Clinical Impression:   Final diagnoses:  Bilateral lower extremity edema  Weakness  Cystitis         Noemi Chapel, MD 04/27/19 2306

## 2019-04-27 DIAGNOSIS — L819 Disorder of pigmentation, unspecified: Secondary | ICD-10-CM | POA: Diagnosis not present

## 2019-04-27 DIAGNOSIS — R200 Anesthesia of skin: Secondary | ICD-10-CM | POA: Diagnosis not present

## 2019-04-27 DIAGNOSIS — R202 Paresthesia of skin: Secondary | ICD-10-CM | POA: Diagnosis not present

## 2019-04-27 DIAGNOSIS — E559 Vitamin D deficiency, unspecified: Secondary | ICD-10-CM | POA: Diagnosis not present

## 2019-04-27 DIAGNOSIS — G5793 Unspecified mononeuropathy of bilateral lower limbs: Secondary | ICD-10-CM | POA: Diagnosis not present

## 2019-04-27 DIAGNOSIS — E531 Pyridoxine deficiency: Secondary | ICD-10-CM | POA: Diagnosis not present

## 2019-04-27 DIAGNOSIS — R531 Weakness: Secondary | ICD-10-CM | POA: Diagnosis not present

## 2019-04-27 DIAGNOSIS — E519 Thiamine deficiency, unspecified: Secondary | ICD-10-CM | POA: Diagnosis not present

## 2019-04-27 DIAGNOSIS — M7989 Other specified soft tissue disorders: Secondary | ICD-10-CM | POA: Diagnosis not present

## 2019-04-27 DIAGNOSIS — E569 Vitamin deficiency, unspecified: Secondary | ICD-10-CM | POA: Diagnosis not present

## 2019-04-28 ENCOUNTER — Other Ambulatory Visit: Payer: Self-pay | Admitting: Neurology

## 2019-04-28 DIAGNOSIS — R200 Anesthesia of skin: Secondary | ICD-10-CM

## 2019-04-28 DIAGNOSIS — R202 Paresthesia of skin: Secondary | ICD-10-CM

## 2019-04-29 LAB — URINE CULTURE: Culture: 70000 — AB

## 2019-04-30 ENCOUNTER — Telehealth: Payer: Self-pay | Admitting: Emergency Medicine

## 2019-04-30 NOTE — Telephone Encounter (Signed)
Post ED Visit - Positive Culture Follow-up  Culture report reviewed by antimicrobial stewardship pharmacist: [place] Team []  Elenor Quinones, Pharm.D. []  Heide Guile, Pharm.D., BCPS AQ-ID []  Parks Neptune, Pharm.D., BCPS []  Alycia Rossetti, Pharm.D., BCPS []  [place], [place].D., BCPS, AAHIVP []  Legrand Como, Pharm.D., BCPS, AAHIVP [x]  Salome Arnt, PharmD, BCPS []  Johnnette Gourd, PharmD, BCPS []  Hughes Better, PharmD, BCPS []  Leeroy Cha, PharmD []  Laqueta Linden, PharmD, BCPS []  Albertina Parr, PharmD  [place] Team []  Leodis Sias, PharmD []  Lindell Spar, PharmD []  Royetta Asal, PharmD []  Graylin Shiver, Rph []  Rema Fendt) Glennon Mac, PharmD []  Arlyn Dunning, PharmD []  Netta Cedars, PharmD []  Dia Sitter, PharmD []  Leone Haven, PharmD []  Gretta Arab, PharmD []  Theodis Shove, PharmD []  Peggyann Juba, PharmD []  Reuel Boom, PharmD   Positive urine culture Treated with Ciprofloxacin, organism sensitive to the same and no further patient follow-up is required at this time.  Sandi Raveling Jamare Vanatta 04/30/2019, 12:56 PM

## 2019-05-01 DIAGNOSIS — S61512D Laceration without foreign body of left wrist, subsequent encounter: Secondary | ICD-10-CM | POA: Diagnosis not present

## 2019-05-01 DIAGNOSIS — E785 Hyperlipidemia, unspecified: Secondary | ICD-10-CM | POA: Diagnosis not present

## 2019-05-01 DIAGNOSIS — R200 Anesthesia of skin: Secondary | ICD-10-CM | POA: Diagnosis not present

## 2019-05-01 DIAGNOSIS — N411 Chronic prostatitis: Secondary | ICD-10-CM | POA: Diagnosis not present

## 2019-05-01 DIAGNOSIS — M7989 Other specified soft tissue disorders: Secondary | ICD-10-CM | POA: Diagnosis not present

## 2019-05-01 DIAGNOSIS — R531 Weakness: Secondary | ICD-10-CM | POA: Diagnosis not present

## 2019-05-01 DIAGNOSIS — M549 Dorsalgia, unspecified: Secondary | ICD-10-CM | POA: Diagnosis not present

## 2019-05-01 DIAGNOSIS — I10 Essential (primary) hypertension: Secondary | ICD-10-CM | POA: Diagnosis not present

## 2019-05-01 DIAGNOSIS — G629 Polyneuropathy, unspecified: Secondary | ICD-10-CM | POA: Diagnosis not present

## 2019-05-01 DIAGNOSIS — T85890D Other specified complication of nervous system prosthetic devices, implants and grafts, subsequent encounter: Secondary | ICD-10-CM | POA: Diagnosis not present

## 2019-05-01 DIAGNOSIS — Z466 Encounter for fitting and adjustment of urinary device: Secondary | ICD-10-CM | POA: Diagnosis not present

## 2019-05-01 DIAGNOSIS — Z79891 Long term (current) use of opiate analgesic: Secondary | ICD-10-CM | POA: Diagnosis not present

## 2019-05-01 DIAGNOSIS — R339 Retention of urine, unspecified: Secondary | ICD-10-CM | POA: Diagnosis not present

## 2019-05-01 DIAGNOSIS — G894 Chronic pain syndrome: Secondary | ICD-10-CM | POA: Diagnosis not present

## 2019-05-01 DIAGNOSIS — R202 Paresthesia of skin: Secondary | ICD-10-CM | POA: Diagnosis not present

## 2019-05-02 DIAGNOSIS — R609 Edema, unspecified: Secondary | ICD-10-CM | POA: Diagnosis not present

## 2019-05-02 DIAGNOSIS — Z0000 Encounter for general adult medical examination without abnormal findings: Secondary | ICD-10-CM | POA: Diagnosis not present

## 2019-05-02 DIAGNOSIS — Z1389 Encounter for screening for other disorder: Secondary | ICD-10-CM | POA: Diagnosis not present

## 2019-05-02 DIAGNOSIS — Z0001 Encounter for general adult medical examination with abnormal findings: Secondary | ICD-10-CM | POA: Diagnosis not present

## 2019-05-02 DIAGNOSIS — I10 Essential (primary) hypertension: Secondary | ICD-10-CM | POA: Diagnosis not present

## 2019-05-09 ENCOUNTER — Other Ambulatory Visit: Payer: Self-pay

## 2019-05-09 ENCOUNTER — Ambulatory Visit (INDEPENDENT_AMBULATORY_CARE_PROVIDER_SITE_OTHER): Payer: Medicare Other | Admitting: Vascular Surgery

## 2019-05-09 ENCOUNTER — Encounter (INDEPENDENT_AMBULATORY_CARE_PROVIDER_SITE_OTHER): Payer: Self-pay | Admitting: Vascular Surgery

## 2019-05-09 VITALS — BP 138/84 | HR 79 | Resp 16

## 2019-05-09 DIAGNOSIS — I10 Essential (primary) hypertension: Secondary | ICD-10-CM

## 2019-05-09 DIAGNOSIS — M79604 Pain in right leg: Secondary | ICD-10-CM

## 2019-05-09 DIAGNOSIS — R350 Frequency of micturition: Secondary | ICD-10-CM | POA: Diagnosis not present

## 2019-05-09 DIAGNOSIS — E782 Mixed hyperlipidemia: Secondary | ICD-10-CM

## 2019-05-09 DIAGNOSIS — M79609 Pain in unspecified limb: Secondary | ICD-10-CM | POA: Insufficient documentation

## 2019-05-09 DIAGNOSIS — R3914 Feeling of incomplete bladder emptying: Secondary | ICD-10-CM | POA: Diagnosis not present

## 2019-05-09 DIAGNOSIS — G629 Polyneuropathy, unspecified: Secondary | ICD-10-CM

## 2019-05-09 DIAGNOSIS — R3915 Urgency of urination: Secondary | ICD-10-CM | POA: Diagnosis not present

## 2019-05-09 NOTE — Assessment & Plan Note (Signed)
I think this is the primary component of his lower extremity symptoms.  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  He certainly could have significant arterial or venous insufficiency associated we will be checking for this.

## 2019-05-09 NOTE — Assessment & Plan Note (Addendum)
lipid control important in reducing the progression of atherosclerotic disease.   

## 2019-05-09 NOTE — Assessment & Plan Note (Addendum)
The patient has fairly profound weakness, discoloration, swelling, and pain in the right leg.  Although I think neuropathy may be the primary culprit, there certainly may be a component of arterial insufficiency or venous insufficiency as contributing factors to this.  We discussed the pathophysiology and natural history both arterial and venous disease today and noninvasive studies will be performed in the near future at his convenience.  We discussed compression elevation to help with the swelling.  Activity as tolerated may help some as well.  We will see him back after his noninvasive studies.

## 2019-05-09 NOTE — Assessment & Plan Note (Signed)
blood pressure control important in reducing the progression of atherosclerotic disease. On appropriate oral medications.  

## 2019-05-09 NOTE — Patient Instructions (Signed)
Peripheral Vascular Disease  Peripheral vascular disease (PVD) is a disease of the blood vessels that are not part of your heart and brain. A simple term for PVD is poor circulation. In most cases, PVD narrows the blood vessels that carry blood from your heart to the rest of your body. This can reduce the supply of blood to your arms, legs, and internal organs, like your stomach or kidneys. However, PVD most often affects a person's lower legs and feet. Without treatment, PVD tends to get worse. PVD can also lead to acute ischemic limb. This is when an arm or leg suddenly cannot get enough blood. This is a medical emergency. Follow these instructions at home: Lifestyle  Do not use any products that contain nicotine or tobacco, such as cigarettes and e-cigarettes. If you need help quitting, ask your doctor.  Lose weight if you are overweight. Or, stay at a healthy weight as told by your doctor.  Eat a diet that is low in fat and cholesterol. If you need help, ask your doctor.  Exercise regularly. Ask your doctor for activities that are right for you. General instructions  Take over-the-counter and prescription medicines only as told by your doctor.  Take good care of your feet: ? Wear comfortable shoes that fit well. ? Check your feet often for any cuts or sores.  Keep all follow-up visits as told by your doctor This is important. Contact a doctor if:  You have cramps in your legs when you walk.  You have leg pain when you are at rest.  You have coldness in a leg or foot.  Your skin changes.  You are unable to get or have an erection (erectile dysfunction).  You have cuts or sores on your feet that do not heal. Get help right away if:  Your arm or leg turns cold, numb, and blue.  Your arms or legs become red, warm, swollen, painful, or numb.  You have chest pain.  You have trouble breathing.  You suddenly have weakness in your face, arm, or leg.  You become very  confused or you cannot speak.  You suddenly have a very bad headache.  You suddenly cannot see. Summary  Peripheral vascular disease (PVD) is a disease of the blood vessels.  A simple term for PVD is poor circulation. Without treatment, PVD tends to get worse.  Treatment may include exercise, low fat and low cholesterol diet, and quitting smoking. This information is not intended to replace advice given to you by your health care provider. Make sure you discuss any questions you have with your health care provider. Document Revised: 01/08/2017 Document Reviewed: 03/05/2016 Elsevier Patient Education  2020 [employer]  

## 2019-05-09 NOTE — Progress Notes (Signed)
Patient ID: Luke Foster, male   DOB: 18-Feb-1950, 69 y.o.   MRN: [id number]  Chief Complaint  Patient presents with  . New Patient (Initial Visit)    ref Manuella Ghazi le discoloration    HPI Luke Foster is a 69 y.o. male.  I am asked to see the patient by Dr. Manuella Ghazi for evaluation of right leg swelling and discoloration. The patient has fairly profound neurologic problems/neuropathy particularly in the right leg.  The patient has had neuropathic issues from his back and chronic nerve damage for many years.  His right leg is significantly swollen, discolored, and painful.  He does not have open ulcerations or signs of infection.  He has some mobility in his foot and ankle, but is more proximal leg has extremely limited strength and mobility.  His leg is dependent much of the time.  The swelling has been persistent and bothersome.  The leg is heavy and painful.  Left leg really does not have much in the way of symptoms other than some numbness.  It really does not have any appreciable swelling.  This has been a steadily progressive problem over months and years.     Past Medical History:  Diagnosis Date  . Arthritis   . Chronic back pain   . Chronic pain 1999   Bilateral feet (R >L)  . Difficult or painful urination   . High blood pressure   . History of kidney stones    H/O  . Nerve damage   . Pain management   . Paresthesia of foot, bilateral   . Sleep apnea    USES CPAP  . Weakness of both legs     Past Surgical History:  Procedure Laterality Date  . arm surgery    . BACK SURGERY     X2  . FACIAL COSMETIC SURGERY    . HERNIA REPAIR    . IR US GUIDE BX ASP/DRAIN  10/19/2017  . KIDNEY STONE SURGERY    . LUMBAR SPINAL CORD SIMULATOR LEAD REMOVAL N/A 03/01/2019   Procedure: LUMBAR SPINAL CORD SIMULATOR LEAD REMOVAL;  Surgeon: Meade Maw, MD;  Location: [facility] ORS;  Service: Neurosurgery;  Laterality: N/A;  . MASTECTOMY Left   . SPINAL CORD STIMULATOR REMOVAL N/A 03/01/2019   Procedure: LUMBAR SPINAL CORD STIMULATOR REMOVAL;  Surgeon: Meade Maw, MD;  Location: [facility] ORS;  Service: Neurosurgery;  Laterality: N/A;     Family History  Problem Relation Age of Onset  . Heart disease Father        Living, 39  . Breast cancer Mother        Living, 66  . Hypercholesterolemia Mother   . Healthy Brother   . Healthy Sister     Social History   Tobacco Use  . Smoking status: Former Smoker    Packs/day: 2.00    Years: 10.00    Pack years: 20.00    Types: Cigarettes    Quit date: 05/21/1991    Years since quitting: 27.9  . Smokeless tobacco: Former Systems developer    Quit date: 06/08/1991  Substance Use Topics  . Alcohol use: No  . Drug use: No     Allergies  Allergen Reactions  . Erythromycin Itching    Current Outpatient Medications  Medication Sig Dispense Refill  . cyclobenzaprine (FLEXERIL) 10 MG tablet Take 10 mg by mouth 2 (two) times daily as needed for muscle spasms.     . diazepam (VALIUM) 10 MG tablet Take 10 mg  by mouth 2 (two) times daily as needed. For restless leg    . diltiazem (CARDIZEM CD) 120 MG 24 hr capsule Take 120 mg by mouth every evening.     . enalapril (VASOTEC) 5 MG tablet Take 5 mg by mouth 2 (two) times daily.     Marland Kitchen lidocaine (LIDODERM) 5 % Place 1 patch onto the skin daily as needed (pain.). Remove & Discard patch within 12 hours or as directed by MD     . Oxycodone HCl 10 MG TABS Take 1 tablet (10 mg total) by mouth every 6 (six) hours as needed for moderate pain or severe pain. 12 tablet 0  . tamsulosin (FLOMAX) 0.4 MG CAPS capsule Take 0.4 mg by mouth every evening.     . venlafaxine XR (EFFEXOR-XR) 75 MG 24 hr capsule Take 75 mg by mouth 3 (three) times daily.     . cefdinir (OMNICEF) 300 MG capsule Take 1 capsule (300 mg total) by mouth every 12 (twelve) hours. X 2 days (Patient not taking: Reported on 04/26/2019) 4 capsule 0  . oxybutynin (DITROPAN) 5 MG tablet Take 1 tablet (5 mg total) by mouth 3 (three) times daily. X 5  days (Patient not taking: Reported on 05/09/2019) 15 tablet 0  . vitamin B-12 (CYANOCOBALAMIN) 500 MCG tablet Take 1 tablet (500 mcg total) by mouth daily. (Patient not taking: Reported on 04/26/2019)     No current facility-administered medications for this visit.      REVIEW OF SYSTEMS (Negative unless checked)  Constitutional: [] Weight loss  [] Fever  [] Chills Cardiac: [] Chest pain   [] Chest pressure   [] Palpitations   [] Shortness of breath when laying flat   [] Shortness of breath at rest   [] Shortness of breath with exertion. Vascular:  [x] Pain in legs with walking   [] Pain in legs at rest   [] Pain in legs when laying flat   [] Claudication   [] Pain in feet when walking  [] Pain in feet at rest  [] Pain in feet when laying flat   [] History of DVT   [] Phlebitis   [] Swelling in legs   [] Varicose veins   [] Non-healing ulcers Pulmonary:   [] Uses home oxygen   [] Productive cough   [] Hemoptysis   [] Wheeze  [] COPD   [] Asthma Neurologic:  [] Dizziness  [] Blackouts   [] Seizures   [] History of stroke   [] History of TIA  [] Aphasia   [] Temporary blindness   [] Dysphagia   [] Weakness or numbness in arms   [x] Weakness or numbness in legs Musculoskeletal:  [x] Arthritis   [] Joint swelling   [x] Joint pain   [x] Low back pain Hematologic:  [] Easy bruising  [] Easy bleeding   [] Hypercoagulable state   [] Anemic  [] Hepatitis Gastrointestinal:  [] Blood in stool   [] Vomiting blood  [] Gastroesophageal reflux/heartburn   [] Abdominal pain Genitourinary:  [] Chronic kidney disease   [] Difficult urination  [] Frequent urination  [] Burning with urination   [] Hematuria Skin:  [] Rashes   [] Ulcers   [] Wounds Psychological:  [] History of anxiety   []  History of major depression.    Physical Exam BP 138/84 (BP Location: Right Arm)   Pulse 79   Resp 16  Gen:  WD/WN, NAD.  Appears older than stated age Head: [place]/AT, No temporalis wasting.  Ear/Nose/Throat: Hearing grossly intact, nares w/o erythema or drainage, oropharynx w/o  Erythema/Exudate Eyes: Conjunctiva clear, sclera non-icteric  Neck: trachea midline.  No JVD.  Pulmonary:  Good air movement, respirations not labored, no use of accessory muscles  Cardiac: RRR, no JVD Vascular:  Vessel  Right Left  Radial Palpable Palpable                          DP  trace  1+  PT  not palpable  1+   Gastrointestinal:. No masses, surgical incisions, or scars. Musculoskeletal: Right leg is profoundly weak.  He has function in the foot and ankle, but really does not have much knee flexion or extension.  Extremities without ischemic changes.  There is no appreciable left lower extremity edema, but there is 2+ right lower extremity edema.  There is darkening stasis changes in the right leg.  He is in a wheelchair. Neurologic: Right leg weakness as above.  Speech is fluent. Motor exam as listed above. Psychiatric: Judgment intact, Mood & affect appropriate for pt's clinical situation. Dermatologic: No rashes or ulcers noted.  No cellulitis or open wounds.    Radiology DG Chest Portable 1 View  Result Date: 04/26/2019 CLINICAL DATA:  Worsening lower extremity edema EXAM: PORTABLE CHEST 1 VIEW COMPARISON:  01/28/2015 chest radiograph. FINDINGS: Stable cardiomediastinal silhouette with normal heart size. No pneumothorax. No pleural effusion. Lungs appear clear, with no acute consolidative airspace disease and no pulmonary edema. IMPRESSION: No active disease. Electronically Signed   By: Ilona Sorrel M.D.   On: 04/26/2019 18:40    Labs Recent Results (from the past 2160 hour(s))  APTT     Status: None   Collection Time: 02/24/19 10:45 AM  Result Value Ref Range   aPTT 28 24 - 36 seconds    Comment: Performed at [facility], [address] [place], [place] [id number]  Basic metabolic panel     Status: None   Collection Time: 02/24/19 10:45 AM  Result Value Ref Range   Sodium 140 135 - 145 mmol/L   Potassium 3.5 3.5 - 5.1 mmol/L   Chloride 101 98 - 111  mmol/L   CO2 31 22 - 32 mmol/L   Glucose, Bld 86 70 - 99 mg/dL   BUN 19 8 - 23 mg/dL   Creatinine, Ser 1.13 0.61 - 1.24 mg/dL   Calcium 9.6 8.9 - 10.3 mg/dL   GFR calc non Af Amer >60 >60 mL/min   GFR calc Af Amer >60 >60 mL/min   Anion gap 8 5 - 15    Comment: Performed at [facility], [address] [place], [place] [address]  CBC     Status: None   Collection Time: 02/24/19 10:45 AM  Result Value Ref Range   WBC 5.7 4.0 - 10.5 K/uL   RBC 4.97 4.22 - 5.81 MIL/uL   Hemoglobin 14.4 13.0 - 17.0 g/dL   HCT 44.2 39.0 - 52.0 %   MCV 88.9 80.0 - 100.0 fL   MCH 29.0 26.0 - 34.0 pg   MCHC 32.6 30.0 - 36.0 g/dL   RDW 11.8 11.5 - 15.5 %   Platelets 220 150 - 400 K/uL   nRBC 0.0 0.0 - 0.2 %    Comment: Performed at [facility], [address] [place], [place] [address]  Protime-INR     Status: None   Collection Time: 02/24/19 10:45 AM  Result Value Ref Range   Prothrombin Time 13.4 11.4 - 15.2 seconds   INR 1.0 0.8 - 1.2    Comment: (NOTE) INR goal varies based on device and disease states. Performed at [facility], [address] [place], [place] [address]   Surgical pcr screen  Status: Abnormal   Collection Time: 02/24/19 10:45 AM   Specimen: Nasal Mucosa; Nasal Swab  Result Value Ref Range   MRSA, PCR POSITIVE (A) NEGATIVE    Comment: RESULT CALLED TO, READ BACK BY AND VERIFIED WITH:  Kathie Dike 02/24/19 1415. CST    Staphylococcus aureus POSITIVE (A) NEGATIVE    Comment: (NOTE) The Xpert SA Assay (FDA approved for NASAL specimens in patients 81 years of age and older), is one component of a comprehensive surveillance program. It is not intended to diagnose infection nor to guide or monitor treatment. Performed at [facility], [address] [place], [place] [address]   Type and screen     Status: None   Collection Time: 02/24/19 10:45 AM  Result Value Ref Range   ABO/RH(D) A POS    Antibody Screen NEG    Sample  Expiration 03/10/2019,2359    Extend sample reason      NO TRANSFUSIONS OR PREGNANCY IN THE PAST 3 MONTHS Performed at [facility], [address] [place], [place] [address]   SARS CORONAVIRUS 2 (TAT 6-24 HRS) Nasopharyngeal Nasopharyngeal Swab     Status: None   Collection Time: 02/27/19 10:00 AM   Specimen: Nasopharyngeal Swab  Result Value Ref Range   SARS Coronavirus 2 NEGATIVE NEGATIVE    Comment: (NOTE) SARS-CoV-2 target nucleic acids are NOT DETECTED. The SARS-CoV-2 RNA is generally detectable in upper and lower respiratory specimens during the acute phase of infection. Negative results do not preclude SARS-CoV-2 infection, do not rule out co-infections with other pathogens, and should not be used as the sole basis for treatment or other patient management decisions. Negative results must be combined with clinical observations, patient history, and epidemiological information. The expected result is Negative. Fact Sheet for Patients: SugarRoll.be Fact Sheet for Healthcare Providers: https://www.woods-mathews.com/ This test is not yet approved or cleared by the [place] FDA and  has been authorized for detection and/or diagnosis of SARS-CoV-2 by FDA under an Emergency Use Authorization (EUA). This EUA will remain  in effect (meaning this test can be used) for the duration of the COVID-19 declaration under Section 56 4(b)(1) of the Act, 21 U.S.C. section 360bbb-3(b)(1), unless the authorization is terminated or revoked sooner. Performed at [facility], [place] [address] [place], [place] [address]   Urinalysis, Routine w reflex microscopic     Status: Abnormal   Collection Time: 02/27/19 10:01 AM  Result Value Ref Range   Color, Urine YELLOW (A) YELLOW   APPearance CLEAR (A) CLEAR   Specific Gravity, Urine 1.013 1.005 - 1.030   pH 6.0 5.0 - 8.0   Glucose, UA NEGATIVE NEGATIVE mg/dL   Hgb urine dipstick  NEGATIVE NEGATIVE   Bilirubin Urine NEGATIVE NEGATIVE   Ketones, ur NEGATIVE NEGATIVE mg/dL   Protein, ur NEGATIVE NEGATIVE mg/dL   Nitrite NEGATIVE NEGATIVE   Leukocytes,Ua NEGATIVE NEGATIVE    Comment: Performed at [facility], [address] [place], [place] [address]  ABO/Rh     Status: None   Collection Time: 03/01/19 10:11 AM  Result Value Ref Range   ABO/RH(D)      A POS Performed at [facility], [address] [place],  [address]   CBC with Differential     Status: None   Collection Time: 03/07/19 12:44 AM  Result Value Ref Range   WBC 7.4 4.0 - 10.5 K/uL   RBC 4.91 4.22 - 5.81 MIL/uL   Hemoglobin 14.1 13.0 -  17.0 g/dL   HCT 43.2 39.0 - 52.0 %   MCV 88.0 80.0 - 100.0 fL   MCH 28.7 26.0 - 34.0 pg   MCHC 32.6 30.0 - 36.0 g/dL   RDW 11.5 11.5 - 15.5 %   Platelets 266 150 - 400 K/uL   nRBC 0.0 0.0 - 0.2 %   Neutrophils Relative % 78 %   Neutro Abs 5.8 1.7 - 7.7 K/uL   Lymphocytes Relative 13 %   Lymphs Abs 0.9 0.7 - 4.0 K/uL   Monocytes Relative 7 %   Monocytes Absolute 0.6 0.1 - 1.0 K/uL   Eosinophils Relative 1 %   Eosinophils Absolute 0.1 0.0 - 0.5 K/uL   Basophils Relative 1 %   Basophils Absolute 0.1 0.0 - 0.1 K/uL   Immature Granulocytes 0 %   Abs Immature Granulocytes 0.02 0.00 - 0.07 K/uL    Comment: Performed at [facility], [address], [place], [place] [address]  Comprehensive metabolic panel     Status: Abnormal   Collection Time: 03/07/19 12:44 AM  Result Value Ref Range   Sodium 137 135 - 145 mmol/L   Potassium 3.7 3.5 - 5.1 mmol/L   Chloride 100 98 - 111 mmol/L   CO2 27 22 - 32 mmol/L   Glucose, Bld 125 (H) 70 - 99 mg/dL   BUN 20 8 - 23 mg/dL   Creatinine, Ser 1.02 0.61 - 1.24 mg/dL   Calcium 9.3 8.9 - 10.3 mg/dL   Total Protein 7.2 6.5 - 8.1 g/dL   Albumin 3.5 3.5 - 5.0 g/dL   AST 35 15 - 41 U/L   ALT 27 0 - 44 U/L   Alkaline Phosphatase 81 38 - 126 U/L   Total Bilirubin 0.8 0.3 - 1.2 mg/dL   GFR calc non  Af Amer >60 >60 mL/min   GFR calc Af Amer >60 >60 mL/min   Anion gap 10 5 - 15    Comment: Performed at [facility], [address], [place], [place] [address]  CK     Status: None   Collection Time: 03/07/19 12:44 AM  Result Value Ref Range   Total CK 207 49 - 397 U/L    Comment: Performed at [facility], [address] [place], [place] [address]  Magnesium     Status: None   Collection Time: 03/07/19 12:44 AM  Result Value Ref Range   Magnesium 2.0 1.7 - 2.4 mg/dL    Comment: Performed at [facility], [address] [place], [place] [address]  Phosphorus     Status: None   Collection Time: 03/07/19 12:44 AM  Result Value Ref Range   Phosphorus 3.3 2.5 - 4.6 mg/dL    Comment: Performed at [facility], [address] [place], [place] [address]  SARS CORONAVIRUS 2 (TAT 6-24 HRS) Nasopharyngeal Nasopharyngeal Swab     Status: None   Collection Time: 03/07/19  1:34 AM   Specimen: Nasopharyngeal Swab  Result Value Ref Range   SARS Coronavirus 2 NEGATIVE NEGATIVE    Comment: (NOTE) SARS-CoV-2 target nucleic acids are NOT DETECTED. The SARS-CoV-2 RNA is generally detectable in upper and lower respiratory specimens during the acute phase of infection. Negative results do not preclude SARS-CoV-2 infection, do not rule out co-infections with other pathogens, and should not be used as the sole basis for treatment or other patient management decisions. Negative results must be combined with clinical observations, patient history, and epidemiological information. The expected result is Negative. Fact Sheet  for Patients: SugarRoll.be Fact Sheet for Healthcare Providers: https://www.woods-mathews.com/ This test is not yet approved or cleared by the [place] FDA and  has been authorized for detection and/or diagnosis of SARS-CoV-2 by FDA under an Emergency Use Authorization (EUA). This EUA will remain  in effect (meaning this test can be used)  for the duration of the COVID-19 declaration under Section 56 4(b)(1) of the Act, 21 U.S.C. section 360bbb-3(b)(1), unless the authorization is terminated or revoked sooner. Performed at [facility], [place] [address], [place], [place] [address]   Urine culture     Status: None   Collection Time: 03/07/19  2:04 AM   Specimen: Urine, Catheterized  Result Value Ref Range   Specimen Description      URINE, CATHETERIZED Performed at [facility], [address], [place], [place] [address]    Special Requests      Normal Performed at [facility], [address] [place], [place] [address]    Culture      NO GROWTH Performed at [facility], [place] [address], [place], [place] [address]    Report Status 03/08/2019 FINAL   Urinalysis, Routine w reflex microscopic     Status: Abnormal   Collection Time: 03/07/19  2:11 AM  Result Value Ref Range   Color, Urine YELLOW YELLOW   APPearance HAZY (A) CLEAR   Specific Gravity, Urine 1.010 1.005 - 1.030   pH 7.0 5.0 - 8.0   Glucose, UA NEGATIVE NEGATIVE mg/dL   Hgb urine dipstick LARGE (A) NEGATIVE   Bilirubin Urine NEGATIVE NEGATIVE   Ketones, ur NEGATIVE NEGATIVE mg/dL   Protein, ur 30 (A) NEGATIVE mg/dL   Nitrite NEGATIVE NEGATIVE   Leukocytes,Ua NEGATIVE NEGATIVE   RBC / HPF >50 (H) 0 - 5 RBC/hpf   Bacteria, UA NONE SEEN NONE SEEN   Mucus PRESENT     Comment: Performed at [facility], [address], [place], [place] [address]  Glucose, capillary     Status: None   Collection Time: 03/08/19  5:17 AM  Result Value Ref Range   Glucose-Capillary 89 70 - 99 mg/dL  HIV Antibody (routine testing w rflx)     Status: None   Collection Time: 03/08/19  5:24 AM  Result Value Ref Range   HIV Screen 4th Generation wRfx NON REACTIVE NON REACTIVE    Comment: Performed at [facility] [address] [place], [place] [address]  Comprehensive metabolic panel     Status: None   Collection Time: 03/08/19  5:24 AM  Result Value Ref  Range   Sodium 139 135 - 145 mmol/L   Potassium 3.8 3.5 - 5.1 mmol/L   Chloride 102 98 - 111 mmol/L   CO2 25 22 - 32 mmol/L   Glucose, Bld 99 70 - 99 mg/dL   BUN 18 8 - 23 mg/dL   Creatinine, Ser 0.96 0.61 - 1.24 mg/dL   Calcium 9.8 8.9 - 10.3 mg/dL   Total Protein 7.3 6.5 - 8.1 g/dL   Albumin 3.5 3.5 - 5.0 g/dL   AST 29 15 - 41 U/L   ALT 23 0 - 44 U/L   Alkaline Phosphatase 74 38 - 126 U/L   Total Bilirubin 0.8 0.3 - 1.2 mg/dL   GFR calc non Af Amer >60 >60 mL/min   GFR calc Af Amer >60 >60 mL/min   Anion gap 12 5 - 15    Comment: Performed at [facility], [address],  [place], [place] [address]  CBC     Status: None   Collection Time: 03/08/19  5:24 AM  Result Value Ref Range   WBC 8.5 4.0 - 10.5 K/uL   RBC 4.79 4.22 - 5.81 MIL/uL   Hemoglobin 13.8 13.0 - 17.0 g/dL   HCT 42.9 39.0 - 52.0 %   MCV 89.6 80.0 - 100.0 fL   MCH 28.8 26.0 - 34.0 pg   MCHC 32.2 30.0 - 36.0 g/dL   RDW 11.5 11.5 - 15.5 %   Platelets 261 150 - 400 K/uL   nRBC 0.0 0.0 - 0.2 %    Comment: Performed at [facility], [address], [place], [place] [address]  Ammonia     Status: None   Collection Time: 03/09/19  5:42 AM  Result Value Ref Range   Ammonia 10 9 - 35 umol/L    Comment: Performed at [facility], [address], [place], [place] [address]  TSH     Status: Abnormal   Collection Time: 03/09/19  5:42 AM  Result Value Ref Range   TSH 0.113 (L) 0.350 - 4.500 uIU/mL    Comment: Performed by a 3rd Generation assay with a functional sensitivity of <=0.01 uIU/mL. Performed at [facility], [address] [place], [place] [address]   T4, free     Status: None   Collection Time: 03/09/19  5:42 AM  Result Value Ref Range   Free T4 1.09 0.61 - 1.12 ng/dL    Comment: (NOTE) Biotin ingestion may interfere with free T4 tests. If the results are inconsistent with the TSH level, previous test results, or the clinical presentation, then consider biotin interference. If needed, order repeat testing  after stopping biotin. Performed at [facility], [place] [address] [place], [place] [address]   Vitamin B12     Status: Abnormal   Collection Time: 03/09/19  5:42 AM  Result Value Ref Range   Vitamin B-12 126 (L) 180 - 914 pg/mL    Comment: (NOTE) This assay is not validated for testing neonatal or myeloproliferative syndrome specimens for Vitamin B12 levels. Performed at [facility], [address], [place], [place] [address]   Folate     Status: None   Collection Time: 03/09/19  5:42 AM  Result Value Ref Range   Folate 7.7 >5.9 ng/mL    Comment: Performed at [facility], [address] [place], [place] [id number]  Basic metabolic panel     Status: Abnormal   Collection Time: 03/09/19  5:42 AM  Result Value Ref Range   Sodium 141 135 - 145 mmol/L   Potassium 3.5 3.5 - 5.1 mmol/L   Chloride 104 98 - 111 mmol/L   CO2 28 22 - 32 mmol/L   Glucose, Bld 112 (H) 70 - 99 mg/dL   BUN 20 8 - 23 mg/dL   Creatinine, Ser 0.93 0.61 - 1.24 mg/dL   Calcium 9.6 8.9 - 10.3 mg/dL   GFR calc non Af Amer >60 >60 mL/min   GFR calc Af Amer >60 >60 mL/min   Anion gap 9 5 - 15    Comment: Performed at [facility], [address] [place], [place] [address]  CBC     Status: Abnormal   Collection Time: 03/09/19  5:42 AM  Result Value Ref Range   WBC 5.8 4.0 - 10.5 K/uL   RBC 4.50 4.22 - 5.81 MIL/uL   Hemoglobin 12.9 (L) 13.0 - 17.0 g/dL   HCT 40.7 39.0 -  52.0 %   MCV 90.4 80.0 - 100.0 fL   MCH 28.7 26.0 - 34.0 pg   MCHC 31.7 30.0 - 36.0 g/dL   RDW 11.5 11.5 - 15.5 %   Platelets 258 150 - 400 K/uL   nRBC 0.0 0.0 - 0.2 %    Comment: Performed at [facility], [address], [place], [place] [address]  Comprehensive metabolic panel     Status: None   Collection Time: 04/26/19  5:21 PM  Result Value Ref Range   Sodium 137 135 - 145 mmol/L   Potassium 3.7 3.5 - 5.1 mmol/L   Chloride 104 98 - 111 mmol/L   CO2 26 22 - 32 mmol/L   Glucose, Bld 91 70 - 99 mg/dL    Comment: Glucose reference  range applies only to samples taken after fasting for at least 8 hours.   BUN 18 8 - 23 mg/dL   Creatinine, Ser 1.17 0.61 - 1.24 mg/dL   Calcium 9.2 8.9 - 10.3 mg/dL   Total Protein 7.3 6.5 - 8.1 g/dL   Albumin 3.6 3.5 - 5.0 g/dL   AST 17 15 - 41 U/L   ALT 14 0 - 44 U/L   Alkaline Phosphatase 121 38 - 126 U/L   Total Bilirubin 0.4 0.3 - 1.2 mg/dL   GFR calc non Af Amer >60 >60 mL/min   GFR calc Af Amer >60 >60 mL/min   Anion gap 7 5 - 15    Comment: Performed at [facility], [address] [place], [place] [address]  CBC with Differential     Status: None   Collection Time: 04/26/19  5:21 PM  Result Value Ref Range   WBC 8.8 4.0 - 10.5 K/uL   RBC 4.59 4.22 - 5.81 MIL/uL   Hemoglobin 13.4 13.0 - 17.0 g/dL   HCT 42.1 39.0 - 52.0 %   MCV 91.7 80.0 - 100.0 fL   MCH 29.2 26.0 - 34.0 pg   MCHC 31.8 30.0 - 36.0 g/dL   RDW 12.8 11.5 - 15.5 %   Platelets 231 150 - 400 K/uL   nRBC 0.0 0.0 - 0.2 %   Neutrophils Relative % 77 %   Neutro Abs 6.8 1.7 - 7.7 K/uL   Lymphocytes Relative 13 %   Lymphs Abs 1.1 0.7 - 4.0 K/uL   Monocytes Relative 8 %   Monocytes Absolute 0.7 0.1 - 1.0 K/uL   Eosinophils Relative 1 %   Eosinophils Absolute 0.1 0.0 - 0.5 K/uL   Basophils Relative 1 %   Basophils Absolute 0.1 0.0 - 0.1 K/uL   Immature Granulocytes 0 %   Abs Immature Granulocytes 0.03 0.00 - 0.07 K/uL    Comment: Performed at [facility], [address] [place], [place] [address]  Brain natriuretic peptide     Status: None   Collection Time: 04/26/19  5:21 PM  Result Value Ref Range   B Natriuretic Peptide 37.0 0.0 - 100.0 pg/mL    Comment: Performed at [facility], [address], [place], [place] [address]  Troponin I (High Sensitivity)     Status: None   Collection Time: 04/26/19  5:21 PM  Result Value Ref Range   Troponin I (High Sensitivity) 4 <18 ng/L    Comment: (NOTE) Elevated high sensitivity troponin I (hsTnI) values and significant  changes across serial measurements may  suggest ACS but many other  chronic and acute conditions are known to elevate hsTnI results.  Refer to the "Links"  section for chest pain algorithms and additional  guidance. Performed at [facility], [address] [place], [place] [address]   Urinalysis, Routine w reflex microscopic     Status: Abnormal   Collection Time: 04/26/19  8:00 PM  Result Value Ref Range   Color, Urine YELLOW YELLOW   APPearance HAZY (A) CLEAR   Specific Gravity, Urine 1.015 1.005 - 1.030   pH 7.0 5.0 - 8.0   Glucose, UA NEGATIVE NEGATIVE mg/dL   Hgb urine dipstick SMALL (A) NEGATIVE   Bilirubin Urine NEGATIVE NEGATIVE   Ketones, ur NEGATIVE NEGATIVE mg/dL   Protein, ur NEGATIVE NEGATIVE mg/dL   Nitrite NEGATIVE NEGATIVE   Leukocytes,Ua LARGE (A) NEGATIVE   RBC / HPF 0-5 0 - 5 RBC/hpf   WBC, UA >50 (H) 0 - 5 WBC/hpf   Bacteria, UA NONE SEEN NONE SEEN    Comment: Performed at [facility], [address], [place], [place] [address]  Urine culture     Status: Abnormal   Collection Time: 04/26/19  8:00 PM   Specimen: Urine, Clean Catch  Result Value Ref Range   Specimen Description      URINE, CLEAN CATCH Performed at [facility], [address], [place], [place] [address]    Special Requests      NONE Performed at [facility], [address], [place], [place] [address]    Culture (A)     70,000 COLONIES/mL METHICILLIN RESISTANT STAPHYLOCOCCUS AUREUS   Report Status 04/29/2019 FINAL    Organism ID, Bacteria METHICILLIN RESISTANT STAPHYLOCOCCUS AUREUS (A)       Susceptibility   Methicillin resistant staphylococcus aureus - MIC*    CIPROFLOXACIN <=0.5 SENSITIVE Sensitive     GENTAMICIN <=0.5 SENSITIVE Sensitive     NITROFURANTOIN <=16 SENSITIVE Sensitive     OXACILLIN >=4 RESISTANT Resistant     TETRACYCLINE <=1 SENSITIVE Sensitive     VANCOMYCIN <=0.5 SENSITIVE Sensitive     TRIMETH/SULFA <=10 SENSITIVE Sensitive     CLINDAMYCIN <=0.25 SENSITIVE Sensitive     RIFAMPIN <=0.5 SENSITIVE Sensitive      Inducible Clindamycin NEGATIVE Sensitive     * 70,000 COLONIES/mL METHICILLIN RESISTANT STAPHYLOCOCCUS AUREUS    Assessment/Plan:  Benign essential HTN blood pressure control important in reducing the progression of atherosclerotic disease. On appropriate oral medications.   Hyperlipidemia, mixed lipid control important in reducing the progression of atherosclerotic disease.    Neuropathy I think this is the primary component of his lower extremity symptoms.  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  He certainly could have significant arterial or venous insufficiency associated we will be checking for this.  Pain in limb The patient has fairly profound weakness, discoloration, swelling, and pain in the right leg.  Although I think neuropathy may be the primary culprit, there certainly may be a component of arterial insufficiency or venous insufficiency as contributing factors to this.  We discussed the pathophysiology and natural history both arterial and venous disease today and noninvasive studies will be performed in the near future at his convenience.  We discussed compression elevation to help with the swelling.  Activity as tolerated may help some as well.  We will see him back after his noninvasive studies.      Leotis Pain 05/09/2019, 1:29 PM   This note was created with Dragon medical transcription system.  Any errors from dictation are unintentional.

## 2019-05-10 ENCOUNTER — Ambulatory Visit: Payer: Medicare Other

## 2019-05-10 DIAGNOSIS — I10 Essential (primary) hypertension: Secondary | ICD-10-CM | POA: Diagnosis not present

## 2019-05-10 DIAGNOSIS — G894 Chronic pain syndrome: Secondary | ICD-10-CM | POA: Diagnosis not present

## 2019-05-11 ENCOUNTER — Other Ambulatory Visit: Payer: Self-pay

## 2019-05-11 ENCOUNTER — Ambulatory Visit
Admission: RE | Admit: 2019-05-11 | Discharge: 2019-05-11 | Disposition: A | Discharge status: Home / Self Care | Payer: Medicare Other | Source: Ambulatory Visit | Attending: Neurology | Admitting: Neurology

## 2019-05-11 DIAGNOSIS — M50221 Other cervical disc displacement at C4-C5 level: Secondary | ICD-10-CM | POA: Diagnosis not present

## 2019-05-11 DIAGNOSIS — G4730 Sleep apnea, unspecified: Secondary | ICD-10-CM | POA: Diagnosis not present

## 2019-05-11 DIAGNOSIS — R338 Other retention of urine: Secondary | ICD-10-CM | POA: Diagnosis not present

## 2019-05-11 DIAGNOSIS — Z9181 History of falling: Secondary | ICD-10-CM | POA: Diagnosis not present

## 2019-05-11 DIAGNOSIS — Z79891 Long term (current) use of opiate analgesic: Secondary | ICD-10-CM | POA: Diagnosis not present

## 2019-05-11 DIAGNOSIS — R202 Paresthesia of skin: Secondary | ICD-10-CM | POA: Diagnosis not present

## 2019-05-11 DIAGNOSIS — K5900 Constipation, unspecified: Secondary | ICD-10-CM | POA: Diagnosis not present

## 2019-05-11 DIAGNOSIS — G629 Polyneuropathy, unspecified: Secondary | ICD-10-CM | POA: Diagnosis not present

## 2019-05-11 DIAGNOSIS — G2581 Restless legs syndrome: Secondary | ICD-10-CM | POA: Diagnosis not present

## 2019-05-11 DIAGNOSIS — S61512D Laceration without foreign body of left wrist, subsequent encounter: Secondary | ICD-10-CM | POA: Diagnosis not present

## 2019-05-11 DIAGNOSIS — M1991 Primary osteoarthritis, unspecified site: Secondary | ICD-10-CM | POA: Diagnosis not present

## 2019-05-11 DIAGNOSIS — N411 Chronic prostatitis: Secondary | ICD-10-CM | POA: Diagnosis not present

## 2019-05-11 DIAGNOSIS — R200 Anesthesia of skin: Secondary | ICD-10-CM | POA: Insufficient documentation

## 2019-05-11 DIAGNOSIS — N390 Urinary tract infection, site not specified: Secondary | ICD-10-CM | POA: Diagnosis not present

## 2019-05-11 DIAGNOSIS — I10 Essential (primary) hypertension: Secondary | ICD-10-CM | POA: Diagnosis not present

## 2019-05-11 DIAGNOSIS — G894 Chronic pain syndrome: Secondary | ICD-10-CM | POA: Diagnosis not present

## 2019-05-11 DIAGNOSIS — M21371 Foot drop, right foot: Secondary | ICD-10-CM | POA: Diagnosis not present

## 2019-05-11 DIAGNOSIS — E785 Hyperlipidemia, unspecified: Secondary | ICD-10-CM | POA: Diagnosis not present

## 2019-05-17 DIAGNOSIS — G894 Chronic pain syndrome: Secondary | ICD-10-CM | POA: Diagnosis not present

## 2019-05-17 DIAGNOSIS — R338 Other retention of urine: Secondary | ICD-10-CM | POA: Diagnosis not present

## 2019-05-17 DIAGNOSIS — E785 Hyperlipidemia, unspecified: Secondary | ICD-10-CM | POA: Diagnosis not present

## 2019-05-17 DIAGNOSIS — R202 Paresthesia of skin: Secondary | ICD-10-CM | POA: Diagnosis not present

## 2019-05-17 DIAGNOSIS — K5900 Constipation, unspecified: Secondary | ICD-10-CM | POA: Diagnosis not present

## 2019-05-17 DIAGNOSIS — N411 Chronic prostatitis: Secondary | ICD-10-CM | POA: Diagnosis not present

## 2019-05-17 DIAGNOSIS — I10 Essential (primary) hypertension: Secondary | ICD-10-CM | POA: Diagnosis not present

## 2019-05-17 DIAGNOSIS — G2581 Restless legs syndrome: Secondary | ICD-10-CM | POA: Diagnosis not present

## 2019-05-17 DIAGNOSIS — M21371 Foot drop, right foot: Secondary | ICD-10-CM | POA: Diagnosis not present

## 2019-05-17 DIAGNOSIS — M1991 Primary osteoarthritis, unspecified site: Secondary | ICD-10-CM | POA: Diagnosis not present

## 2019-05-17 DIAGNOSIS — S61512D Laceration without foreign body of left wrist, subsequent encounter: Secondary | ICD-10-CM | POA: Diagnosis not present

## 2019-05-17 DIAGNOSIS — Z9181 History of falling: Secondary | ICD-10-CM | POA: Diagnosis not present

## 2019-05-17 DIAGNOSIS — G4730 Sleep apnea, unspecified: Secondary | ICD-10-CM | POA: Diagnosis not present

## 2019-05-17 DIAGNOSIS — G629 Polyneuropathy, unspecified: Secondary | ICD-10-CM | POA: Diagnosis not present

## 2019-05-17 DIAGNOSIS — Z79891 Long term (current) use of opiate analgesic: Secondary | ICD-10-CM | POA: Diagnosis not present

## 2019-05-17 DIAGNOSIS — N390 Urinary tract infection, site not specified: Secondary | ICD-10-CM | POA: Diagnosis not present

## 2019-05-22 ENCOUNTER — Ambulatory Visit (INDEPENDENT_AMBULATORY_CARE_PROVIDER_SITE_OTHER): Payer: Medicare Other

## 2019-05-22 ENCOUNTER — Encounter (INDEPENDENT_AMBULATORY_CARE_PROVIDER_SITE_OTHER): Payer: Self-pay | Admitting: Nurse Practitioner

## 2019-05-22 ENCOUNTER — Other Ambulatory Visit: Payer: Self-pay

## 2019-05-22 ENCOUNTER — Ambulatory Visit (INDEPENDENT_AMBULATORY_CARE_PROVIDER_SITE_OTHER): Payer: Medicare Other | Admitting: Nurse Practitioner

## 2019-05-22 VITALS — BP 129/85 | HR 88 | Ht 68.0 in | Wt 197.0 lb

## 2019-05-22 DIAGNOSIS — M79604 Pain in right leg: Secondary | ICD-10-CM | POA: Diagnosis not present

## 2019-05-22 DIAGNOSIS — I83811 Varicose veins of right lower extremities with pain: Secondary | ICD-10-CM | POA: Diagnosis not present

## 2019-05-22 DIAGNOSIS — G629 Polyneuropathy, unspecified: Secondary | ICD-10-CM

## 2019-05-23 ENCOUNTER — Encounter (INDEPENDENT_AMBULATORY_CARE_PROVIDER_SITE_OTHER): Payer: Self-pay | Admitting: Nurse Practitioner

## 2019-05-23 DIAGNOSIS — M1991 Primary osteoarthritis, unspecified site: Secondary | ICD-10-CM | POA: Diagnosis not present

## 2019-05-23 DIAGNOSIS — G4730 Sleep apnea, unspecified: Secondary | ICD-10-CM | POA: Diagnosis not present

## 2019-05-23 DIAGNOSIS — G629 Polyneuropathy, unspecified: Secondary | ICD-10-CM | POA: Diagnosis not present

## 2019-05-23 DIAGNOSIS — Z79891 Long term (current) use of opiate analgesic: Secondary | ICD-10-CM | POA: Diagnosis not present

## 2019-05-23 DIAGNOSIS — S61512D Laceration without foreign body of left wrist, subsequent encounter: Secondary | ICD-10-CM | POA: Diagnosis not present

## 2019-05-23 DIAGNOSIS — K5900 Constipation, unspecified: Secondary | ICD-10-CM | POA: Diagnosis not present

## 2019-05-23 DIAGNOSIS — E785 Hyperlipidemia, unspecified: Secondary | ICD-10-CM | POA: Diagnosis not present

## 2019-05-23 DIAGNOSIS — N411 Chronic prostatitis: Secondary | ICD-10-CM | POA: Diagnosis not present

## 2019-05-23 DIAGNOSIS — R202 Paresthesia of skin: Secondary | ICD-10-CM | POA: Diagnosis not present

## 2019-05-23 DIAGNOSIS — R338 Other retention of urine: Secondary | ICD-10-CM | POA: Diagnosis not present

## 2019-05-23 DIAGNOSIS — Z9181 History of falling: Secondary | ICD-10-CM | POA: Diagnosis not present

## 2019-05-23 DIAGNOSIS — G2581 Restless legs syndrome: Secondary | ICD-10-CM | POA: Diagnosis not present

## 2019-05-23 DIAGNOSIS — M21371 Foot drop, right foot: Secondary | ICD-10-CM | POA: Diagnosis not present

## 2019-05-23 DIAGNOSIS — G894 Chronic pain syndrome: Secondary | ICD-10-CM | POA: Diagnosis not present

## 2019-05-23 DIAGNOSIS — N390 Urinary tract infection, site not specified: Secondary | ICD-10-CM | POA: Diagnosis not present

## 2019-05-23 DIAGNOSIS — I10 Essential (primary) hypertension: Secondary | ICD-10-CM | POA: Diagnosis not present

## 2019-05-23 NOTE — Patient Instructions (Signed)
Endovenous Ablation Endovenous ablation is a procedure that seals off an abnormally enlarged leg vein (varicose vein). This procedure uses heat from radiofrequency waves or a laser to close off the affected vein. This procedure may be done if the vein is causing pain, swelling, sores on the skin (ulcers), or skin discoloration. Closing off the vein can help reduce these symptoms by preventing the pooling of blood that causes varicose veins. Tell a health care provider about:  Any allergies you have.  All medicines you are taking, including vitamins, herbs, eye drops, creams, and over-the-counter medicines.  Any problems you or family members have had with anesthetic medicines.  Any blood disorders you have.  Any surgeries you have had.  Any medical conditions you have or have had.  Whether you are pregnant or may be pregnant. What are the risks? Generally, this is a safe procedure. However, problems may occur, including:  Infection.  Bleeding.  Allergic reactions to medicines.  Damage to other structures.  Numbness or tingling along the leg. This is uncommon, and it is usually temporary.  Vein swelling. This is usually temporary.  Blood clots that form in a deep vein of the leg (deep vein thrombosis, or DVT) and can travel to the lungs (pulmonary embolism, or PE). This is very rare. What happens before the procedure? Medicines Ask your health care provider about:  Changing or stopping your regular medicines. This is especially important if you are taking diabetes medicines or blood thinners.  Taking medicines such as aspirin and ibuprofen. These medicines can thin your blood. Do not take these medicines unless your health care provider tells you to take them.  Taking over-the-counter medicines, vitamins, herbs, and supplements. Eating and drinking Follow instructions from your health care provider about eating and drinking, which may include:  8 hours before the procedure  - stop eating heavy meals or foods, such as meat, fried foods, or fatty foods.  6 hours before the procedure - stop eating light meals or foods, such as toast or cereal.  6 hours before the procedure - stop drinking milk or drinks that contain milk.  2 hours before the procedure - stop drinking clear liquids. Staying hydrated Follow instructions from your health care provider about hydration, which may include:  Up to 2 hours before the procedure - you may continue to drink clear liquids, such as water, clear fruit juice, black coffee, and plain tea.  General instructions  You may have blood tests to make sure that your blood can clot normally.  Plan to have someone take you home from the hospital or clinic.  If you will be going home right after the procedure, plan to have someone with you for 24 hours.  Do not use any products that contain nicotine or tobacco for at least 4 weeks before the procedure. These products include cigarettes, e-cigarettes, and chewing tobacco. If you need help quitting, ask your health care provider.  Ask your health care provider what steps will be taken to help prevent infection. These may include: ? Removing hair at the surgery site. ? Washing skin with a germ-killing soap. What happens during the procedure?   You will lie on an exam table.  An IV will be inserted into one of your veins.  You will be given: ? A medicine to help you relax (sedative). ? A medicine to numb the area (local anesthetic).  Your health care provider will use an imaging tool that uses sound waves (ultrasonogram) to show images  of your leg veins.  A small incision will be made near the area that will be treated. A narrow tube (catheter) will be slipped through the incision and into the vein.  Small sensors (electrodes or laser fibers) will be passed through the catheter and into the vein.  Radiofrequency or laser energy will be sent through the sensors to burn the vein.  This seals off the vein.  The electrodes, laser fibers, and catheter will be removed from the vein.  A bandage (dressing) will be placed over the incision. The procedure may vary among health care providers and hospitals. What happens after the procedure?  Your blood pressure, heart rate, breathing rate, and blood oxygen level may be monitored until you leave the hospital or clinic.  You may have to wear compression stockings. These stockings help to prevent blood clots and reduce swelling in your legs.  You will be encouraged to walk around right after the procedure.  Do not drive for 24 hours if you were given a sedative during your procedure. Summary  Endovenous ablation is a procedure that seals off an abnormally enlarged leg vein (varicose vein).  Follow instructions from your health care provider about changing or stopping your medicines. This is especially important if you are taking diabetes medicines or blood thinners.  Follow instructions from your health care provider about eating and drinking before the procedure.  Plan to have someone take you home from the hospital or clinic.  After the procedure, you may have to wear compression stockings, and you will be encouraged to walk around right away. This information is not intended to replace advice given to you by your health care provider. Make sure you discuss any questions you have with your health care provider. Document Revised: 10/06/2017 Document Reviewed: 10/06/2017 Elsevier Patient Education  2020 [employer].  Varicose Veins Varicose veins are veins that have become enlarged, bulged, and twisted. They most often appear in the legs. What are the causes? This condition is caused by damage to the valves in the vein. These valves help blood return to your heart. When they are damaged and they stop working properly, blood may flow backward and back up in the veins near the skin, causing the veins to get larger and  appear twisted. The condition can result from any issue that causes blood to back up, like pregnancy, prolonged standing, or obesity. What increases the risk? This condition is more likely to develop in people who are:  On their feet a lot.  Pregnant.  Overweight. What are the signs or symptoms? Symptoms of this condition include:  Bulging, twisted, and bluish veins.  A feeling of heaviness. This may be worse at the end of the day.  Leg pain. This may be worse at the end of the day.  Swelling in the leg.  Changes in skin color over the veins. How is this diagnosed? This condition may be diagnosed based on your symptoms, a physical exam, and an ultrasound test. How is this treated? Treatment for this condition may involve:  Avoiding sitting or standing in one position for long periods of time.  Wearing compression stockings. These stockings help to prevent blood clots and reduce swelling in the legs.  Raising (elevating) the legs when resting.  Losing weight.  Exercising regularly. If you have persistent symptoms or want to improve the way your varicose veins look, you may choose to have a procedure to close the varicose veins off or to remove them. Treatments to  close off the veins include:  Sclerotherapy. In this treatment, a solution is injected into a vein to close it off.  Laser treatment. In this treatment, the vein is heated with a laser to close it off.  Radiofrequency vein ablation. In this treatment, an electrical current produced by radio waves is used to close off the vein. Treatments to remove the veins include:  Phlebectomy. In this treatment, the veins are removed through small incisions made over the veins.  Vein ligation and stripping. In this treatment, incisions are made over the veins. The veins are then removed after being tied (ligated) with stitches (sutures). Follow these instructions at home: Activity  Walk as much as possible. Walking  increases blood flow. This helps blood return to the heart and takes pressure off your veins. It also increases your cardiovascular strength.  Follow your health care provider's instructions about exercising.  Do not stand or sit in one position for a long period of time.  Do not sit with your legs crossed.  Rest with your legs raised during the day. General instructions   Follow any diet instructions given to you by your health care provider.  Wear compression stockings as directed by your health care provider. Do not wear other kinds of tight clothing around your legs, pelvis, or waist.  Elevate your legs at night to above the level of your heart.  If you get a cut in the skin over the varicose vein and the vein bleeds: ? Lie down with your leg raised. ? Apply firm pressure to the cut with a clean cloth until the bleeding stops. ? Place a bandage (dressing) on the cut. Contact a health care provider if:  The skin around your varicose veins starts to break down.  You have pain, redness, tenderness, or hard swelling over a vein.  You are uncomfortable because of pain.  You get a cut in the skin over a varicose vein and it will not stop bleeding. Summary  Varicose veins are veins that have become enlarged, bulged, and twisted. They most often appear in the legs.  This condition is caused by damage to the valves in the vein. These valves help blood return to your heart.  Treatment for this condition includes frequent movements, wearing compression stockings, losing weight, and exercising regularly. In some cases, procedures are done to close off or remove the veins.  Treatment for this condition may include wearing compression stockings, elevating the legs, losing weight, and engaging in regular activity. In some cases, procedures are done to close off or remove the veins. This information is not intended to replace advice given to you by your health care provider. Make sure you  discuss any questions you have with your health care provider. Document Revised: 03/24/2018 Document Reviewed: 02/19/2016 Elsevier Patient Education  [place].

## 2019-05-23 NOTE — Progress Notes (Signed)
Subjective:    Patient ID: Luke Foster, male    DOB: 1950-02-18, 69 y.o.   MRN: [id number] Chief Complaint  Patient presents with  . Follow-up    U/S follow up    Luke Foster is a 69 y.o. male.  The patient returns for review of noninvasive studies related to lower extremity swelling, discoloration and pain.  The right lower extremity has been significantly swollen discolored and painful.  However the patient does have some extensive neurological issues related to his back as well as chronic nerve damage.  However the neuropathy seems to be worsening in his right lower extremity.  Patient's left lower extremity has some numbness however not the extent of the swelling and discomfort of the right lower extremity has.  This pain has been getting progressively worse over the last few months.  The swelling has become more intense and bothersome.  Unfortunately due to the patient's chronic nerve damage he has significant difficulty with compression due to the pain and discomfort it causes.   Today the patient underwent bilateral ABIs which reveals an ABI of 1.09 on the right and 1.24 on the left.  The patient has triphasic tibial arteries with strong toe waveforms bilaterally.  Patient also underwent right lower extremity venous reflux studies.  There is no evidence of DVT or superficial venous thrombosis in the right lower extremity.  There is evidence of deep venous reflux in the mid femoral vein extending to the popliteal vein.  The patient also has evidence of reflux in his superficial venous system in the great saphenous vein at the mid thigh with a vein diameter measuring of 0.24 cm.  He also has it in the small saphenous vein with a reflux of nearly 3 seconds and a vein diameter of 0.35.   Review of Systems  Cardiovascular: Positive for leg swelling.  Musculoskeletal: Positive for back pain.  Skin: Positive for color change.  Neurological: Positive for weakness and numbness.  All other  systems reviewed and are negative.      Objective:   Physical Exam Vitals reviewed. Exam conducted with a chaperone present.  HENT:     Head: Normocephalic.  Cardiovascular:     Rate and Rhythm: Normal rate and regular rhythm.     Pulses:          Dorsalis pedis pulses are 1+ on the right side and 1+ on the left side.       Posterior tibial pulses are 1+ on the right side and 1+ on the left side.  Pulmonary:     Effort: Pulmonary effort is normal.     Breath sounds: Normal breath sounds.  Musculoskeletal:     Right lower leg: 2+ Edema present.  Neurological:     Mental Status: He is alert and oriented to person, place, and time.     Motor: Weakness present.     Coordination: Coordination abnormal.     Gait: Gait abnormal.  Psychiatric:        Mood and Affect: Mood normal.        Behavior: Behavior normal.        Thought Content: Thought content normal.        Judgment: Judgment normal.     BP 129/85   Pulse 88   Ht 5\' 8"  (1.727 m)   Wt 197 lb (89.4 kg)   BMI 29.95 kg/m   Past Medical History:  Diagnosis Date  . Arthritis   . Chronic back  pain   . Chronic pain 1999   Bilateral feet (R >L)  . Difficult or painful urination   . High blood pressure   . History of kidney stones    H/O  . Nerve damage   . Pain management   . Paresthesia of foot, bilateral   . Sleep apnea    USES CPAP  . Weakness of both legs     Social History   Socioeconomic History  . Marital status: Single    Spouse name: Not on file  . Number of children: 0  . Years of education: Not on file  . Highest education level: High school graduate  Occupational History  . Not on file  Tobacco Use  . Smoking status: Former Smoker    Packs/day: 2.00    Years: 10.00    Pack years: 20.00    Types: Cigarettes    Quit date: 05/21/1991    Years since quitting: 28.0  . Smokeless tobacco: Former Systems developer    Quit date: 06/08/1991  Substance and Sexual Activity  . Alcohol use: No  . Drug use: No    . Sexual activity: Not on file  Other Topics Concern  . Not on file  Social History Narrative   Lives with wife.   He is on disability since 2011 due to chronic back pain.      Caffeine - one mountain dew in the morning    Social Determinants of Health   Financial Resource Strain:   . Difficulty of Paying Living Expenses:   Food Insecurity:   . Worried About Charity fundraiser in the Last Year:   . Arboriculturist in the Last Year:   Transportation Needs:   . Film/video editor (Medical):   Marland Kitchen Lack of Transportation (Non-Medical):   Physical Activity:   . Days of Exercise per Week:   . Minutes of Exercise per Session:   Stress:   . Feeling of Stress :   Social Connections:   . Frequency of Communication with Friends and Family:   . Frequency of Social Gatherings with Friends and Family:   . Attends Religious Services:   . Active Member of Clubs or Organizations:   . Attends Archivist Meetings:   Marland Kitchen Marital Status:   Intimate Partner Violence:   . Fear of Current or Ex-Partner:   . Emotionally Abused:   Marland Kitchen Physically Abused:   . Sexually Abused:     Past Surgical History:  Procedure Laterality Date  . arm surgery    . BACK SURGERY     X2  . FACIAL COSMETIC SURGERY    . HERNIA REPAIR    . IR US GUIDE BX ASP/DRAIN  10/19/2017  . KIDNEY STONE SURGERY    . LUMBAR SPINAL CORD SIMULATOR LEAD REMOVAL N/A 03/01/2019   Procedure: LUMBAR SPINAL CORD SIMULATOR LEAD REMOVAL;  Surgeon: Meade Maw, MD;  Location: [facility] ORS;  Service: Neurosurgery;  Laterality: N/A;  . MASTECTOMY Left   . SPINAL CORD STIMULATOR REMOVAL N/A 03/01/2019   Procedure: LUMBAR SPINAL CORD STIMULATOR REMOVAL;  Surgeon: Meade Maw, MD;  Location: [facility] ORS;  Service: Neurosurgery;  Laterality: N/A;    Family History  Problem Relation Age of Onset  . Heart disease Father        Living, 33  . Breast cancer Mother        Living, 61  . Hypercholesterolemia Mother   . Healthy  Brother   . Healthy Sister  Allergies  Allergen Reactions  . Erythromycin Itching       Assessment & Plan:   1. Pain of right lower extremity Recommend:  I do not find evidence of arterial pathology that would explain the patient's symptoms  The patient has atypical pain symptoms for arterial vascular disease  Many of the symptoms are likely related to the patient's chronic nerve pain in addition to some varicose veins and edema.  The patient should continue walking and begin a more formal exercise program. The patient should continue his antiplatelet therapy and aggressive treatment of the lipid abnormalities.  Patient will follow-up with me on a PRN basis  Further work-up of her lower extremity pain is deferred to the primary service     2. Varicose veins of right lower extremity with pain The patient does have evidence of reflux in his right lower extremity.  Today we discussed endovenous laser ablation with the patient however at this time he does not wish to move forward but wants to consider it a little further.  Patient states that he is not certain that his discomfort is at a place where he will want to undergo a procedure to alter his venous anatomy at this time.  Patient will continue with conservative therapy such as elevating his lower extremities as he is able to help with the swelling.  Otherwise, the patient will contact her office if the swelling becomes worse or she develops new ulcerations or if there are other new issues that may arise.  Patient will continue to follow-up on an as-needed basis.  3. Neuropathy This is likely contributing to the patient's right lower extremity discomfort.  While the lower extremity swelling does not cause neuropathy in some cases it can worsen it.  Patient will continue with previous medications and will continue to follow with neurologist/PCP for management of neuropathy.   Current Outpatient Medications on File Prior to Visit   Medication Sig Dispense Refill  . diazepam (VALIUM) 10 MG tablet Take 10 mg by mouth 2 (two) times daily as needed. For restless leg    . enalapril (VASOTEC) 5 MG tablet Take 5 mg by mouth 2 (two) times daily.     Marland Kitchen lidocaine (LIDODERM) 5 % Place 1 patch onto the skin daily as needed (pain.). Remove & Discard patch within 12 hours or as directed by MD     . Oxycodone HCl 10 MG TABS Take 1 tablet (10 mg total) by mouth every 6 (six) hours as needed for moderate pain or severe pain. 12 tablet 0  . tamsulosin (FLOMAX) 0.4 MG CAPS capsule Take 0.4 mg by mouth every evening.     . venlafaxine XR (EFFEXOR-XR) 75 MG 24 hr capsule Take 75 mg by mouth 3 (three) times daily.     . vitamin B-12 (CYANOCOBALAMIN) 500 MCG tablet Take 1 tablet (500 mcg total) by mouth daily.    . cefdinir (OMNICEF) 300 MG capsule Take 1 capsule (300 mg total) by mouth every 12 (twelve) hours. X 2 days (Patient not taking: Reported on 04/26/2019) 4 capsule 0  . cyclobenzaprine (FLEXERIL) 10 MG tablet Take 10 mg by mouth 2 (two) times daily as needed for muscle spasms.     Marland Kitchen diltiazem (CARDIZEM CD) 120 MG 24 hr capsule Take 120 mg by mouth every evening.     . mirabegron ER (MYRBETRIQ) 50 MG TB24 tablet Take by mouth.    . oxybutynin (DITROPAN) 5 MG tablet Take 1 tablet (5 mg total) by  mouth 3 (three) times daily. X 5 days (Patient not taking: Reported on 05/09/2019) 15 tablet 0   No current facility-administered medications on file prior to visit.    Patient Instructions  Endovenous Ablation Endovenous ablation is a procedure that seals off an abnormally enlarged leg vein (varicose vein). This procedure uses heat from radiofrequency waves or a laser to close off the affected vein. This procedure may be done if the vein is causing pain, swelling, sores on the skin (ulcers), or skin discoloration. Closing off the vein can help reduce these symptoms by preventing the pooling of blood that causes varicose veins. Tell a health care  provider about:  Any allergies you have.  All medicines you are taking, including vitamins, herbs, eye drops, creams, and over-the-counter medicines.  Any problems you or family members have had with anesthetic medicines.  Any blood disorders you have.  Any surgeries you have had.  Any medical conditions you have or have had.  Whether you are pregnant or may be pregnant. What are the risks? Generally, this is a safe procedure. However, problems may occur, including:  Infection.  Bleeding.  Allergic reactions to medicines.  Damage to other structures.  Numbness or tingling along the leg. This is uncommon, and it is usually temporary.  Vein swelling. This is usually temporary.  Blood clots that form in a deep vein of the leg (deep vein thrombosis, or DVT) and can travel to the lungs (pulmonary embolism, or PE). This is very rare. What happens before the procedure? Medicines Ask your health care provider about:  Changing or stopping your regular medicines. This is especially important if you are taking diabetes medicines or blood thinners.  Taking medicines such as aspirin and ibuprofen. These medicines can thin your blood. Do not take these medicines unless your health care provider tells you to take them.  Taking over-the-counter medicines, vitamins, herbs, and supplements. Eating and drinking Follow instructions from your health care provider about eating and drinking, which may include:  8 hours before the procedure - stop eating heavy meals or foods, such as meat, fried foods, or fatty foods.  6 hours before the procedure - stop eating light meals or foods, such as toast or cereal.  6 hours before the procedure - stop drinking milk or drinks that contain milk.  2 hours before the procedure - stop drinking clear liquids. Staying hydrated Follow instructions from your health care provider about hydration, which may include:  Up to 2 hours before the procedure -  you may continue to drink clear liquids, such as water, clear fruit juice, black coffee, and plain tea.  General instructions  You may have blood tests to make sure that your blood can clot normally.  Plan to have someone take you home from the hospital or clinic.  If you will be going home right after the procedure, plan to have someone with you for 24 hours.  Do not use any products that contain nicotine or tobacco for at least 4 weeks before the procedure. These products include cigarettes, e-cigarettes, and chewing tobacco. If you need help quitting, ask your health care provider.  Ask your health care provider what steps will be taken to help prevent infection. These may include: ? Removing hair at the surgery site. ? Washing skin with a germ-killing soap. What happens during the procedure?   You will lie on an exam table.  An IV will be inserted into one of your veins.  You will be given: ?  A medicine to help you relax (sedative). ? A medicine to numb the area (local anesthetic).  Your health care provider will use an imaging tool that uses sound waves (ultrasonogram) to show images of your leg veins.  A small incision will be made near the area that will be treated. A narrow tube (catheter) will be slipped through the incision and into the vein.  Small sensors (electrodes or laser fibers) will be passed through the catheter and into the vein.  Radiofrequency or laser energy will be sent through the sensors to burn the vein. This seals off the vein.  The electrodes, laser fibers, and catheter will be removed from the vein.  A bandage (dressing) will be placed over the incision. The procedure may vary among health care providers and hospitals. What happens after the procedure?  Your blood pressure, heart rate, breathing rate, and blood oxygen level may be monitored until you leave the hospital or clinic.  You may have to wear compression stockings. These stockings help  to prevent blood clots and reduce swelling in your legs.  You will be encouraged to walk around right after the procedure.  Do not drive for 24 hours if you were given a sedative during your procedure. Summary  Endovenous ablation is a procedure that seals off an abnormally enlarged leg vein (varicose vein).  Follow instructions from your health care provider about changing or stopping your medicines. This is especially important if you are taking diabetes medicines or blood thinners.  Follow instructions from your health care provider about eating and drinking before the procedure.  Plan to have someone take you home from the hospital or clinic.  After the procedure, you may have to wear compression stockings, and you will be encouraged to walk around right away. This information is not intended to replace advice given to you by your health care provider. Make sure you discuss any questions you have with your health care provider. Document Revised: 10/06/2017 Document Reviewed: 10/06/2017 Elsevier Patient Education  2020 [place].  Varicose Veins Varicose veins are veins that have become enlarged, bulged, and twisted. They most often appear in the legs. What are the causes? This condition is caused by damage to the valves in the vein. These valves help blood return to your heart. When they are damaged and they stop working properly, blood may flow backward and back up in the veins near the skin, causing the veins to get larger and appear twisted. The condition can result from any issue that causes blood to back up, like pregnancy, prolonged standing, or obesity. What increases the risk? This condition is more likely to develop in people who are:  On their feet a lot.  Pregnant.  Overweight. What are the signs or symptoms? Symptoms of this condition include:  Bulging, twisted, and bluish veins.  A feeling of heaviness. This may be worse at the end of the day.  Leg pain.  This may be worse at the end of the day.  Swelling in the leg.  Changes in skin color over the veins. How is this diagnosed? This condition may be diagnosed based on your symptoms, a physical exam, and an ultrasound test. How is this treated? Treatment for this condition may involve:  Avoiding sitting or standing in one position for long periods of time.  Wearing compression stockings. These stockings help to prevent blood clots and reduce swelling in the legs.  Raising (elevating) the legs when resting.  Losing weight.  Exercising regularly.  If you have persistent symptoms or want to improve the way your varicose veins look, you may choose to have a procedure to close the varicose veins off or to remove them. Treatments to close off the veins include:  Sclerotherapy. In this treatment, a solution is injected into a vein to close it off.  Laser treatment. In this treatment, the vein is heated with a laser to close it off.  Radiofrequency vein ablation. In this treatment, an electrical current produced by radio waves is used to close off the vein. Treatments to remove the veins include:  Phlebectomy. In this treatment, the veins are removed through small incisions made over the veins.  Vein ligation and stripping. In this treatment, incisions are made over the veins. The veins are then removed after being tied (ligated) with stitches (sutures). Follow these instructions at home: Activity  Walk as much as possible. Walking increases blood flow. This helps blood return to the heart and takes pressure off your veins. It also increases your cardiovascular strength.  Follow your health care provider's instructions about exercising.  Do not stand or sit in one position for a long period of time.  Do not sit with your legs crossed.  Rest with your legs raised during the day. General instructions   Follow any diet instructions given to you by your health care provider.  Wear  compression stockings as directed by your health care provider. Do not wear other kinds of tight clothing around your legs, pelvis, or waist.  Elevate your legs at night to above the level of your heart.  If you get a cut in the skin over the varicose vein and the vein bleeds: ? Lie down with your leg raised. ? Apply firm pressure to the cut with a clean cloth until the bleeding stops. ? Place a bandage (dressing) on the cut. Contact a health care provider if:  The skin around your varicose veins starts to break down.  You have pain, redness, tenderness, or hard swelling over a vein.  You are uncomfortable because of pain.  You get a cut in the skin over a varicose vein and it will not stop bleeding. Summary  Varicose veins are veins that have become enlarged, bulged, and twisted. They most often appear in the legs.  This condition is caused by damage to the valves in the vein. These valves help blood return to your heart.  Treatment for this condition includes frequent movements, wearing compression stockings, losing weight, and exercising regularly. In some cases, procedures are done to close off or remove the veins.  Treatment for this condition may include wearing compression stockings, elevating the legs, losing weight, and engaging in regular activity. In some cases, procedures are done to close off or remove the veins. This information is not intended to replace advice given to you by your health care provider. Make sure you discuss any questions you have with your health care provider. Document Revised: 03/24/2018 Document Reviewed: 02/19/2016 Elsevier Patient Education  [employer].   Return if symptoms worsen or fail to improve.   Kris Hartmann, NP

## 2019-05-24 DIAGNOSIS — M21371 Foot drop, right foot: Secondary | ICD-10-CM | POA: Diagnosis not present

## 2019-05-24 DIAGNOSIS — Z79891 Long term (current) use of opiate analgesic: Secondary | ICD-10-CM | POA: Diagnosis not present

## 2019-05-24 DIAGNOSIS — I10 Essential (primary) hypertension: Secondary | ICD-10-CM | POA: Diagnosis not present

## 2019-05-24 DIAGNOSIS — Z9181 History of falling: Secondary | ICD-10-CM | POA: Diagnosis not present

## 2019-05-24 DIAGNOSIS — R202 Paresthesia of skin: Secondary | ICD-10-CM | POA: Diagnosis not present

## 2019-05-24 DIAGNOSIS — E785 Hyperlipidemia, unspecified: Secondary | ICD-10-CM | POA: Diagnosis not present

## 2019-05-24 DIAGNOSIS — G894 Chronic pain syndrome: Secondary | ICD-10-CM | POA: Diagnosis not present

## 2019-05-24 DIAGNOSIS — M1991 Primary osteoarthritis, unspecified site: Secondary | ICD-10-CM | POA: Diagnosis not present

## 2019-05-24 DIAGNOSIS — G2581 Restless legs syndrome: Secondary | ICD-10-CM | POA: Diagnosis not present

## 2019-05-24 DIAGNOSIS — K5900 Constipation, unspecified: Secondary | ICD-10-CM | POA: Diagnosis not present

## 2019-05-24 DIAGNOSIS — N411 Chronic prostatitis: Secondary | ICD-10-CM | POA: Diagnosis not present

## 2019-05-24 DIAGNOSIS — R338 Other retention of urine: Secondary | ICD-10-CM | POA: Diagnosis not present

## 2019-05-24 DIAGNOSIS — N390 Urinary tract infection, site not specified: Secondary | ICD-10-CM | POA: Diagnosis not present

## 2019-05-24 DIAGNOSIS — G629 Polyneuropathy, unspecified: Secondary | ICD-10-CM | POA: Diagnosis not present

## 2019-05-24 DIAGNOSIS — S61512D Laceration without foreign body of left wrist, subsequent encounter: Secondary | ICD-10-CM | POA: Diagnosis not present

## 2019-05-24 DIAGNOSIS — G4730 Sleep apnea, unspecified: Secondary | ICD-10-CM | POA: Diagnosis not present

## 2019-05-25 DIAGNOSIS — M21371 Foot drop, right foot: Secondary | ICD-10-CM | POA: Diagnosis not present

## 2019-05-25 DIAGNOSIS — I10 Essential (primary) hypertension: Secondary | ICD-10-CM | POA: Diagnosis not present

## 2019-05-25 DIAGNOSIS — G629 Polyneuropathy, unspecified: Secondary | ICD-10-CM | POA: Diagnosis not present

## 2019-05-25 DIAGNOSIS — Z79891 Long term (current) use of opiate analgesic: Secondary | ICD-10-CM | POA: Diagnosis not present

## 2019-05-25 DIAGNOSIS — N390 Urinary tract infection, site not specified: Secondary | ICD-10-CM | POA: Diagnosis not present

## 2019-05-25 DIAGNOSIS — G894 Chronic pain syndrome: Secondary | ICD-10-CM | POA: Diagnosis not present

## 2019-05-25 DIAGNOSIS — N411 Chronic prostatitis: Secondary | ICD-10-CM | POA: Diagnosis not present

## 2019-05-25 DIAGNOSIS — E785 Hyperlipidemia, unspecified: Secondary | ICD-10-CM | POA: Diagnosis not present

## 2019-05-25 DIAGNOSIS — G2581 Restless legs syndrome: Secondary | ICD-10-CM | POA: Diagnosis not present

## 2019-05-25 DIAGNOSIS — G4730 Sleep apnea, unspecified: Secondary | ICD-10-CM | POA: Diagnosis not present

## 2019-05-25 DIAGNOSIS — R338 Other retention of urine: Secondary | ICD-10-CM | POA: Diagnosis not present

## 2019-05-25 DIAGNOSIS — S61512D Laceration without foreign body of left wrist, subsequent encounter: Secondary | ICD-10-CM | POA: Diagnosis not present

## 2019-05-25 DIAGNOSIS — K5900 Constipation, unspecified: Secondary | ICD-10-CM | POA: Diagnosis not present

## 2019-05-25 DIAGNOSIS — Z9181 History of falling: Secondary | ICD-10-CM | POA: Diagnosis not present

## 2019-05-25 DIAGNOSIS — M1991 Primary osteoarthritis, unspecified site: Secondary | ICD-10-CM | POA: Diagnosis not present

## 2019-05-25 DIAGNOSIS — R202 Paresthesia of skin: Secondary | ICD-10-CM | POA: Diagnosis not present

## 2019-05-30 DIAGNOSIS — I10 Essential (primary) hypertension: Secondary | ICD-10-CM | POA: Diagnosis not present

## 2019-05-30 DIAGNOSIS — Z79891 Long term (current) use of opiate analgesic: Secondary | ICD-10-CM | POA: Diagnosis not present

## 2019-05-30 DIAGNOSIS — G629 Polyneuropathy, unspecified: Secondary | ICD-10-CM | POA: Diagnosis not present

## 2019-05-30 DIAGNOSIS — N390 Urinary tract infection, site not specified: Secondary | ICD-10-CM | POA: Diagnosis not present

## 2019-05-30 DIAGNOSIS — K5900 Constipation, unspecified: Secondary | ICD-10-CM | POA: Diagnosis not present

## 2019-05-30 DIAGNOSIS — R202 Paresthesia of skin: Secondary | ICD-10-CM | POA: Diagnosis not present

## 2019-05-30 DIAGNOSIS — R338 Other retention of urine: Secondary | ICD-10-CM | POA: Diagnosis not present

## 2019-05-30 DIAGNOSIS — E785 Hyperlipidemia, unspecified: Secondary | ICD-10-CM | POA: Diagnosis not present

## 2019-05-30 DIAGNOSIS — G4730 Sleep apnea, unspecified: Secondary | ICD-10-CM | POA: Diagnosis not present

## 2019-05-30 DIAGNOSIS — G894 Chronic pain syndrome: Secondary | ICD-10-CM | POA: Diagnosis not present

## 2019-05-30 DIAGNOSIS — M1991 Primary osteoarthritis, unspecified site: Secondary | ICD-10-CM | POA: Diagnosis not present

## 2019-05-30 DIAGNOSIS — G2581 Restless legs syndrome: Secondary | ICD-10-CM | POA: Diagnosis not present

## 2019-05-30 DIAGNOSIS — S61512D Laceration without foreign body of left wrist, subsequent encounter: Secondary | ICD-10-CM | POA: Diagnosis not present

## 2019-05-30 DIAGNOSIS — M21371 Foot drop, right foot: Secondary | ICD-10-CM | POA: Diagnosis not present

## 2019-05-30 DIAGNOSIS — N411 Chronic prostatitis: Secondary | ICD-10-CM | POA: Diagnosis not present

## 2019-05-30 DIAGNOSIS — Z9181 History of falling: Secondary | ICD-10-CM | POA: Diagnosis not present

## 2019-06-01 DIAGNOSIS — I10 Essential (primary) hypertension: Secondary | ICD-10-CM | POA: Diagnosis not present

## 2019-06-01 DIAGNOSIS — M1991 Primary osteoarthritis, unspecified site: Secondary | ICD-10-CM | POA: Diagnosis not present

## 2019-06-01 DIAGNOSIS — G4730 Sleep apnea, unspecified: Secondary | ICD-10-CM | POA: Diagnosis not present

## 2019-06-01 DIAGNOSIS — G629 Polyneuropathy, unspecified: Secondary | ICD-10-CM | POA: Diagnosis not present

## 2019-06-01 DIAGNOSIS — K5900 Constipation, unspecified: Secondary | ICD-10-CM | POA: Diagnosis not present

## 2019-06-01 DIAGNOSIS — N411 Chronic prostatitis: Secondary | ICD-10-CM | POA: Diagnosis not present

## 2019-06-01 DIAGNOSIS — S61512D Laceration without foreign body of left wrist, subsequent encounter: Secondary | ICD-10-CM | POA: Diagnosis not present

## 2019-06-01 DIAGNOSIS — G894 Chronic pain syndrome: Secondary | ICD-10-CM | POA: Diagnosis not present

## 2019-06-01 DIAGNOSIS — M21371 Foot drop, right foot: Secondary | ICD-10-CM | POA: Diagnosis not present

## 2019-06-01 DIAGNOSIS — R338 Other retention of urine: Secondary | ICD-10-CM | POA: Diagnosis not present

## 2019-06-01 DIAGNOSIS — N390 Urinary tract infection, site not specified: Secondary | ICD-10-CM | POA: Diagnosis not present

## 2019-06-01 DIAGNOSIS — E785 Hyperlipidemia, unspecified: Secondary | ICD-10-CM | POA: Diagnosis not present

## 2019-06-01 DIAGNOSIS — Z9181 History of falling: Secondary | ICD-10-CM | POA: Diagnosis not present

## 2019-06-01 DIAGNOSIS — Z79891 Long term (current) use of opiate analgesic: Secondary | ICD-10-CM | POA: Diagnosis not present

## 2019-06-01 DIAGNOSIS — G2581 Restless legs syndrome: Secondary | ICD-10-CM | POA: Diagnosis not present

## 2019-06-01 DIAGNOSIS — R202 Paresthesia of skin: Secondary | ICD-10-CM | POA: Diagnosis not present

## 2019-06-05 DIAGNOSIS — S61512D Laceration without foreign body of left wrist, subsequent encounter: Secondary | ICD-10-CM | POA: Diagnosis not present

## 2019-06-05 DIAGNOSIS — I10 Essential (primary) hypertension: Secondary | ICD-10-CM | POA: Diagnosis not present

## 2019-06-05 DIAGNOSIS — G894 Chronic pain syndrome: Secondary | ICD-10-CM | POA: Diagnosis not present

## 2019-06-05 DIAGNOSIS — M1991 Primary osteoarthritis, unspecified site: Secondary | ICD-10-CM | POA: Diagnosis not present

## 2019-06-05 DIAGNOSIS — Z79891 Long term (current) use of opiate analgesic: Secondary | ICD-10-CM | POA: Diagnosis not present

## 2019-06-05 DIAGNOSIS — M21371 Foot drop, right foot: Secondary | ICD-10-CM | POA: Diagnosis not present

## 2019-06-05 DIAGNOSIS — R202 Paresthesia of skin: Secondary | ICD-10-CM | POA: Diagnosis not present

## 2019-06-05 DIAGNOSIS — N411 Chronic prostatitis: Secondary | ICD-10-CM | POA: Diagnosis not present

## 2019-06-05 DIAGNOSIS — N390 Urinary tract infection, site not specified: Secondary | ICD-10-CM | POA: Diagnosis not present

## 2019-06-05 DIAGNOSIS — R338 Other retention of urine: Secondary | ICD-10-CM | POA: Diagnosis not present

## 2019-06-05 DIAGNOSIS — E785 Hyperlipidemia, unspecified: Secondary | ICD-10-CM | POA: Diagnosis not present

## 2019-06-05 DIAGNOSIS — G4730 Sleep apnea, unspecified: Secondary | ICD-10-CM | POA: Diagnosis not present

## 2019-06-05 DIAGNOSIS — K5900 Constipation, unspecified: Secondary | ICD-10-CM | POA: Diagnosis not present

## 2019-06-05 DIAGNOSIS — Z9181 History of falling: Secondary | ICD-10-CM | POA: Diagnosis not present

## 2019-06-05 DIAGNOSIS — G2581 Restless legs syndrome: Secondary | ICD-10-CM | POA: Diagnosis not present

## 2019-06-05 DIAGNOSIS — G629 Polyneuropathy, unspecified: Secondary | ICD-10-CM | POA: Diagnosis not present

## 2019-06-06 DIAGNOSIS — R5383 Other fatigue: Secondary | ICD-10-CM | POA: Diagnosis not present

## 2019-06-06 DIAGNOSIS — E559 Vitamin D deficiency, unspecified: Secondary | ICD-10-CM | POA: Diagnosis not present

## 2019-06-06 DIAGNOSIS — M810 Age-related osteoporosis without current pathological fracture: Secondary | ICD-10-CM | POA: Diagnosis not present

## 2019-06-07 DIAGNOSIS — R202 Paresthesia of skin: Secondary | ICD-10-CM | POA: Diagnosis not present

## 2019-06-07 DIAGNOSIS — Z9181 History of falling: Secondary | ICD-10-CM | POA: Diagnosis not present

## 2019-06-07 DIAGNOSIS — N390 Urinary tract infection, site not specified: Secondary | ICD-10-CM | POA: Diagnosis not present

## 2019-06-07 DIAGNOSIS — N411 Chronic prostatitis: Secondary | ICD-10-CM | POA: Diagnosis not present

## 2019-06-07 DIAGNOSIS — E785 Hyperlipidemia, unspecified: Secondary | ICD-10-CM | POA: Diagnosis not present

## 2019-06-07 DIAGNOSIS — G629 Polyneuropathy, unspecified: Secondary | ICD-10-CM | POA: Diagnosis not present

## 2019-06-07 DIAGNOSIS — K5900 Constipation, unspecified: Secondary | ICD-10-CM | POA: Diagnosis not present

## 2019-06-07 DIAGNOSIS — Z79891 Long term (current) use of opiate analgesic: Secondary | ICD-10-CM | POA: Diagnosis not present

## 2019-06-07 DIAGNOSIS — M1991 Primary osteoarthritis, unspecified site: Secondary | ICD-10-CM | POA: Diagnosis not present

## 2019-06-07 DIAGNOSIS — R338 Other retention of urine: Secondary | ICD-10-CM | POA: Diagnosis not present

## 2019-06-07 DIAGNOSIS — S61512D Laceration without foreign body of left wrist, subsequent encounter: Secondary | ICD-10-CM | POA: Diagnosis not present

## 2019-06-07 DIAGNOSIS — M21371 Foot drop, right foot: Secondary | ICD-10-CM | POA: Diagnosis not present

## 2019-06-07 DIAGNOSIS — G894 Chronic pain syndrome: Secondary | ICD-10-CM | POA: Diagnosis not present

## 2019-06-07 DIAGNOSIS — I10 Essential (primary) hypertension: Secondary | ICD-10-CM | POA: Diagnosis not present

## 2019-06-07 DIAGNOSIS — G2581 Restless legs syndrome: Secondary | ICD-10-CM | POA: Diagnosis not present

## 2019-06-07 DIAGNOSIS — G4730 Sleep apnea, unspecified: Secondary | ICD-10-CM | POA: Diagnosis not present

## 2019-06-08 DIAGNOSIS — R338 Other retention of urine: Secondary | ICD-10-CM | POA: Diagnosis not present

## 2019-06-08 DIAGNOSIS — S61512D Laceration without foreign body of left wrist, subsequent encounter: Secondary | ICD-10-CM | POA: Diagnosis not present

## 2019-06-08 DIAGNOSIS — R202 Paresthesia of skin: Secondary | ICD-10-CM | POA: Diagnosis not present

## 2019-06-08 DIAGNOSIS — N411 Chronic prostatitis: Secondary | ICD-10-CM | POA: Diagnosis not present

## 2019-06-08 DIAGNOSIS — M21371 Foot drop, right foot: Secondary | ICD-10-CM | POA: Diagnosis not present

## 2019-06-08 DIAGNOSIS — K5900 Constipation, unspecified: Secondary | ICD-10-CM | POA: Diagnosis not present

## 2019-06-08 DIAGNOSIS — E785 Hyperlipidemia, unspecified: Secondary | ICD-10-CM | POA: Diagnosis not present

## 2019-06-08 DIAGNOSIS — Z9181 History of falling: Secondary | ICD-10-CM | POA: Diagnosis not present

## 2019-06-08 DIAGNOSIS — G4730 Sleep apnea, unspecified: Secondary | ICD-10-CM | POA: Diagnosis not present

## 2019-06-08 DIAGNOSIS — I10 Essential (primary) hypertension: Secondary | ICD-10-CM | POA: Diagnosis not present

## 2019-06-08 DIAGNOSIS — Z79891 Long term (current) use of opiate analgesic: Secondary | ICD-10-CM | POA: Diagnosis not present

## 2019-06-08 DIAGNOSIS — G2581 Restless legs syndrome: Secondary | ICD-10-CM | POA: Diagnosis not present

## 2019-06-08 DIAGNOSIS — N390 Urinary tract infection, site not specified: Secondary | ICD-10-CM | POA: Diagnosis not present

## 2019-06-08 DIAGNOSIS — G894 Chronic pain syndrome: Secondary | ICD-10-CM | POA: Diagnosis not present

## 2019-06-08 DIAGNOSIS — M1991 Primary osteoarthritis, unspecified site: Secondary | ICD-10-CM | POA: Diagnosis not present

## 2019-06-08 DIAGNOSIS — G629 Polyneuropathy, unspecified: Secondary | ICD-10-CM | POA: Diagnosis not present

## 2019-06-09 DIAGNOSIS — G894 Chronic pain syndrome: Secondary | ICD-10-CM | POA: Diagnosis not present

## 2019-06-09 DIAGNOSIS — I10 Essential (primary) hypertension: Secondary | ICD-10-CM | POA: Diagnosis not present

## 2019-06-13 DIAGNOSIS — R202 Paresthesia of skin: Secondary | ICD-10-CM | POA: Diagnosis not present

## 2019-06-13 DIAGNOSIS — E785 Hyperlipidemia, unspecified: Secondary | ICD-10-CM | POA: Diagnosis not present

## 2019-06-13 DIAGNOSIS — K5900 Constipation, unspecified: Secondary | ICD-10-CM | POA: Diagnosis not present

## 2019-06-13 DIAGNOSIS — R338 Other retention of urine: Secondary | ICD-10-CM | POA: Diagnosis not present

## 2019-06-13 DIAGNOSIS — G894 Chronic pain syndrome: Secondary | ICD-10-CM | POA: Diagnosis not present

## 2019-06-13 DIAGNOSIS — N411 Chronic prostatitis: Secondary | ICD-10-CM | POA: Diagnosis not present

## 2019-06-13 DIAGNOSIS — S61512D Laceration without foreign body of left wrist, subsequent encounter: Secondary | ICD-10-CM | POA: Diagnosis not present

## 2019-06-13 DIAGNOSIS — G4730 Sleep apnea, unspecified: Secondary | ICD-10-CM | POA: Diagnosis not present

## 2019-06-13 DIAGNOSIS — Z9181 History of falling: Secondary | ICD-10-CM | POA: Diagnosis not present

## 2019-06-13 DIAGNOSIS — M21371 Foot drop, right foot: Secondary | ICD-10-CM | POA: Diagnosis not present

## 2019-06-13 DIAGNOSIS — I10 Essential (primary) hypertension: Secondary | ICD-10-CM | POA: Diagnosis not present

## 2019-06-13 DIAGNOSIS — N390 Urinary tract infection, site not specified: Secondary | ICD-10-CM | POA: Diagnosis not present

## 2019-06-13 DIAGNOSIS — M1991 Primary osteoarthritis, unspecified site: Secondary | ICD-10-CM | POA: Diagnosis not present

## 2019-06-13 DIAGNOSIS — G629 Polyneuropathy, unspecified: Secondary | ICD-10-CM | POA: Diagnosis not present

## 2019-06-13 DIAGNOSIS — G2581 Restless legs syndrome: Secondary | ICD-10-CM | POA: Diagnosis not present

## 2019-06-13 DIAGNOSIS — Z79891 Long term (current) use of opiate analgesic: Secondary | ICD-10-CM | POA: Diagnosis not present

## 2019-06-14 DIAGNOSIS — M810 Age-related osteoporosis without current pathological fracture: Secondary | ICD-10-CM | POA: Diagnosis not present

## 2019-06-14 DIAGNOSIS — E559 Vitamin D deficiency, unspecified: Secondary | ICD-10-CM | POA: Diagnosis not present

## 2019-06-15 DIAGNOSIS — S61512D Laceration without foreign body of left wrist, subsequent encounter: Secondary | ICD-10-CM | POA: Diagnosis not present

## 2019-06-15 DIAGNOSIS — G2581 Restless legs syndrome: Secondary | ICD-10-CM | POA: Diagnosis not present

## 2019-06-15 DIAGNOSIS — R338 Other retention of urine: Secondary | ICD-10-CM | POA: Diagnosis not present

## 2019-06-15 DIAGNOSIS — I10 Essential (primary) hypertension: Secondary | ICD-10-CM | POA: Diagnosis not present

## 2019-06-15 DIAGNOSIS — K5900 Constipation, unspecified: Secondary | ICD-10-CM | POA: Diagnosis not present

## 2019-06-15 DIAGNOSIS — G629 Polyneuropathy, unspecified: Secondary | ICD-10-CM | POA: Diagnosis not present

## 2019-06-15 DIAGNOSIS — G4730 Sleep apnea, unspecified: Secondary | ICD-10-CM | POA: Diagnosis not present

## 2019-06-15 DIAGNOSIS — G894 Chronic pain syndrome: Secondary | ICD-10-CM | POA: Diagnosis not present

## 2019-06-15 DIAGNOSIS — N390 Urinary tract infection, site not specified: Secondary | ICD-10-CM | POA: Diagnosis not present

## 2019-06-15 DIAGNOSIS — Z9181 History of falling: Secondary | ICD-10-CM | POA: Diagnosis not present

## 2019-06-15 DIAGNOSIS — M1991 Primary osteoarthritis, unspecified site: Secondary | ICD-10-CM | POA: Diagnosis not present

## 2019-06-15 DIAGNOSIS — Z79891 Long term (current) use of opiate analgesic: Secondary | ICD-10-CM | POA: Diagnosis not present

## 2019-06-15 DIAGNOSIS — E785 Hyperlipidemia, unspecified: Secondary | ICD-10-CM | POA: Diagnosis not present

## 2019-06-15 DIAGNOSIS — N411 Chronic prostatitis: Secondary | ICD-10-CM | POA: Diagnosis not present

## 2019-06-15 DIAGNOSIS — R202 Paresthesia of skin: Secondary | ICD-10-CM | POA: Diagnosis not present

## 2019-06-15 DIAGNOSIS — M21371 Foot drop, right foot: Secondary | ICD-10-CM | POA: Diagnosis not present

## 2019-06-16 DIAGNOSIS — M21371 Foot drop, right foot: Secondary | ICD-10-CM | POA: Diagnosis not present

## 2019-06-16 DIAGNOSIS — K5900 Constipation, unspecified: Secondary | ICD-10-CM | POA: Diagnosis not present

## 2019-06-16 DIAGNOSIS — I10 Essential (primary) hypertension: Secondary | ICD-10-CM | POA: Diagnosis not present

## 2019-06-16 DIAGNOSIS — M1991 Primary osteoarthritis, unspecified site: Secondary | ICD-10-CM | POA: Diagnosis not present

## 2019-06-16 DIAGNOSIS — R338 Other retention of urine: Secondary | ICD-10-CM | POA: Diagnosis not present

## 2019-06-16 DIAGNOSIS — G629 Polyneuropathy, unspecified: Secondary | ICD-10-CM | POA: Diagnosis not present

## 2019-06-16 DIAGNOSIS — N411 Chronic prostatitis: Secondary | ICD-10-CM | POA: Diagnosis not present

## 2019-06-16 DIAGNOSIS — G894 Chronic pain syndrome: Secondary | ICD-10-CM | POA: Diagnosis not present

## 2019-06-16 DIAGNOSIS — R202 Paresthesia of skin: Secondary | ICD-10-CM | POA: Diagnosis not present

## 2019-06-16 DIAGNOSIS — N390 Urinary tract infection, site not specified: Secondary | ICD-10-CM | POA: Diagnosis not present

## 2019-06-16 DIAGNOSIS — G4730 Sleep apnea, unspecified: Secondary | ICD-10-CM | POA: Diagnosis not present

## 2019-06-16 DIAGNOSIS — S61512D Laceration without foreign body of left wrist, subsequent encounter: Secondary | ICD-10-CM | POA: Diagnosis not present

## 2019-06-16 DIAGNOSIS — Z79891 Long term (current) use of opiate analgesic: Secondary | ICD-10-CM | POA: Diagnosis not present

## 2019-06-16 DIAGNOSIS — E785 Hyperlipidemia, unspecified: Secondary | ICD-10-CM | POA: Diagnosis not present

## 2019-06-16 DIAGNOSIS — G2581 Restless legs syndrome: Secondary | ICD-10-CM | POA: Diagnosis not present

## 2019-06-16 DIAGNOSIS — Z9181 History of falling: Secondary | ICD-10-CM | POA: Diagnosis not present

## 2019-06-20 DIAGNOSIS — R202 Paresthesia of skin: Secondary | ICD-10-CM | POA: Diagnosis not present

## 2019-06-20 DIAGNOSIS — G4730 Sleep apnea, unspecified: Secondary | ICD-10-CM | POA: Diagnosis not present

## 2019-06-20 DIAGNOSIS — N390 Urinary tract infection, site not specified: Secondary | ICD-10-CM | POA: Diagnosis not present

## 2019-06-20 DIAGNOSIS — S61512D Laceration without foreign body of left wrist, subsequent encounter: Secondary | ICD-10-CM | POA: Diagnosis not present

## 2019-06-20 DIAGNOSIS — R338 Other retention of urine: Secondary | ICD-10-CM | POA: Diagnosis not present

## 2019-06-20 DIAGNOSIS — G629 Polyneuropathy, unspecified: Secondary | ICD-10-CM | POA: Diagnosis not present

## 2019-06-20 DIAGNOSIS — N411 Chronic prostatitis: Secondary | ICD-10-CM | POA: Diagnosis not present

## 2019-06-20 DIAGNOSIS — Z79891 Long term (current) use of opiate analgesic: Secondary | ICD-10-CM | POA: Diagnosis not present

## 2019-06-20 DIAGNOSIS — K5900 Constipation, unspecified: Secondary | ICD-10-CM | POA: Diagnosis not present

## 2019-06-20 DIAGNOSIS — M21371 Foot drop, right foot: Secondary | ICD-10-CM | POA: Diagnosis not present

## 2019-06-20 DIAGNOSIS — I10 Essential (primary) hypertension: Secondary | ICD-10-CM | POA: Diagnosis not present

## 2019-06-20 DIAGNOSIS — G894 Chronic pain syndrome: Secondary | ICD-10-CM | POA: Diagnosis not present

## 2019-06-20 DIAGNOSIS — E785 Hyperlipidemia, unspecified: Secondary | ICD-10-CM | POA: Diagnosis not present

## 2019-06-20 DIAGNOSIS — M1991 Primary osteoarthritis, unspecified site: Secondary | ICD-10-CM | POA: Diagnosis not present

## 2019-06-20 DIAGNOSIS — Z9181 History of falling: Secondary | ICD-10-CM | POA: Diagnosis not present

## 2019-06-20 DIAGNOSIS — G2581 Restless legs syndrome: Secondary | ICD-10-CM | POA: Diagnosis not present

## 2019-06-22 DIAGNOSIS — G629 Polyneuropathy, unspecified: Secondary | ICD-10-CM | POA: Diagnosis not present

## 2019-06-22 DIAGNOSIS — G4730 Sleep apnea, unspecified: Secondary | ICD-10-CM | POA: Diagnosis not present

## 2019-06-22 DIAGNOSIS — Z9181 History of falling: Secondary | ICD-10-CM | POA: Diagnosis not present

## 2019-06-22 DIAGNOSIS — R338 Other retention of urine: Secondary | ICD-10-CM | POA: Diagnosis not present

## 2019-06-22 DIAGNOSIS — Z79891 Long term (current) use of opiate analgesic: Secondary | ICD-10-CM | POA: Diagnosis not present

## 2019-06-22 DIAGNOSIS — N411 Chronic prostatitis: Secondary | ICD-10-CM | POA: Diagnosis not present

## 2019-06-22 DIAGNOSIS — E785 Hyperlipidemia, unspecified: Secondary | ICD-10-CM | POA: Diagnosis not present

## 2019-06-22 DIAGNOSIS — G2581 Restless legs syndrome: Secondary | ICD-10-CM | POA: Diagnosis not present

## 2019-06-22 DIAGNOSIS — I10 Essential (primary) hypertension: Secondary | ICD-10-CM | POA: Diagnosis not present

## 2019-06-22 DIAGNOSIS — K5900 Constipation, unspecified: Secondary | ICD-10-CM | POA: Diagnosis not present

## 2019-06-22 DIAGNOSIS — M1991 Primary osteoarthritis, unspecified site: Secondary | ICD-10-CM | POA: Diagnosis not present

## 2019-06-22 DIAGNOSIS — M21371 Foot drop, right foot: Secondary | ICD-10-CM | POA: Diagnosis not present

## 2019-06-22 DIAGNOSIS — N390 Urinary tract infection, site not specified: Secondary | ICD-10-CM | POA: Diagnosis not present

## 2019-06-22 DIAGNOSIS — R202 Paresthesia of skin: Secondary | ICD-10-CM | POA: Diagnosis not present

## 2019-06-22 DIAGNOSIS — G894 Chronic pain syndrome: Secondary | ICD-10-CM | POA: Diagnosis not present

## 2019-06-22 DIAGNOSIS — S61512D Laceration without foreign body of left wrist, subsequent encounter: Secondary | ICD-10-CM | POA: Diagnosis not present

## 2019-06-27 DIAGNOSIS — G894 Chronic pain syndrome: Secondary | ICD-10-CM | POA: Diagnosis not present

## 2019-06-27 DIAGNOSIS — M5136 Other intervertebral disc degeneration, lumbar region: Secondary | ICD-10-CM | POA: Diagnosis not present

## 2019-06-28 DIAGNOSIS — G2581 Restless legs syndrome: Secondary | ICD-10-CM | POA: Diagnosis not present

## 2019-06-28 DIAGNOSIS — S61512D Laceration without foreign body of left wrist, subsequent encounter: Secondary | ICD-10-CM | POA: Diagnosis not present

## 2019-06-28 DIAGNOSIS — I10 Essential (primary) hypertension: Secondary | ICD-10-CM | POA: Diagnosis not present

## 2019-06-28 DIAGNOSIS — M21371 Foot drop, right foot: Secondary | ICD-10-CM | POA: Diagnosis not present

## 2019-06-28 DIAGNOSIS — G894 Chronic pain syndrome: Secondary | ICD-10-CM | POA: Diagnosis not present

## 2019-06-28 DIAGNOSIS — E785 Hyperlipidemia, unspecified: Secondary | ICD-10-CM | POA: Diagnosis not present

## 2019-06-28 DIAGNOSIS — G629 Polyneuropathy, unspecified: Secondary | ICD-10-CM | POA: Diagnosis not present

## 2019-06-28 DIAGNOSIS — Z79891 Long term (current) use of opiate analgesic: Secondary | ICD-10-CM | POA: Diagnosis not present

## 2019-06-28 DIAGNOSIS — N411 Chronic prostatitis: Secondary | ICD-10-CM | POA: Diagnosis not present

## 2019-06-28 DIAGNOSIS — R202 Paresthesia of skin: Secondary | ICD-10-CM | POA: Diagnosis not present

## 2019-06-28 DIAGNOSIS — R338 Other retention of urine: Secondary | ICD-10-CM | POA: Diagnosis not present

## 2019-06-28 DIAGNOSIS — K5900 Constipation, unspecified: Secondary | ICD-10-CM | POA: Diagnosis not present

## 2019-06-28 DIAGNOSIS — M1991 Primary osteoarthritis, unspecified site: Secondary | ICD-10-CM | POA: Diagnosis not present

## 2019-06-28 DIAGNOSIS — G4730 Sleep apnea, unspecified: Secondary | ICD-10-CM | POA: Diagnosis not present

## 2019-06-28 DIAGNOSIS — N390 Urinary tract infection, site not specified: Secondary | ICD-10-CM | POA: Diagnosis not present

## 2019-06-28 DIAGNOSIS — Z9181 History of falling: Secondary | ICD-10-CM | POA: Diagnosis not present

## 2019-06-30 DIAGNOSIS — R202 Paresthesia of skin: Secondary | ICD-10-CM | POA: Diagnosis not present

## 2019-06-30 DIAGNOSIS — R338 Other retention of urine: Secondary | ICD-10-CM | POA: Diagnosis not present

## 2019-06-30 DIAGNOSIS — Z9181 History of falling: Secondary | ICD-10-CM | POA: Diagnosis not present

## 2019-06-30 DIAGNOSIS — G2581 Restless legs syndrome: Secondary | ICD-10-CM | POA: Diagnosis not present

## 2019-06-30 DIAGNOSIS — G4730 Sleep apnea, unspecified: Secondary | ICD-10-CM | POA: Diagnosis not present

## 2019-06-30 DIAGNOSIS — M1991 Primary osteoarthritis, unspecified site: Secondary | ICD-10-CM | POA: Diagnosis not present

## 2019-06-30 DIAGNOSIS — N390 Urinary tract infection, site not specified: Secondary | ICD-10-CM | POA: Diagnosis not present

## 2019-06-30 DIAGNOSIS — G894 Chronic pain syndrome: Secondary | ICD-10-CM | POA: Diagnosis not present

## 2019-06-30 DIAGNOSIS — M21371 Foot drop, right foot: Secondary | ICD-10-CM | POA: Diagnosis not present

## 2019-06-30 DIAGNOSIS — Z79891 Long term (current) use of opiate analgesic: Secondary | ICD-10-CM | POA: Diagnosis not present

## 2019-06-30 DIAGNOSIS — N411 Chronic prostatitis: Secondary | ICD-10-CM | POA: Diagnosis not present

## 2019-06-30 DIAGNOSIS — E785 Hyperlipidemia, unspecified: Secondary | ICD-10-CM | POA: Diagnosis not present

## 2019-06-30 DIAGNOSIS — K5900 Constipation, unspecified: Secondary | ICD-10-CM | POA: Diagnosis not present

## 2019-06-30 DIAGNOSIS — S61512D Laceration without foreign body of left wrist, subsequent encounter: Secondary | ICD-10-CM | POA: Diagnosis not present

## 2019-06-30 DIAGNOSIS — I10 Essential (primary) hypertension: Secondary | ICD-10-CM | POA: Diagnosis not present

## 2019-06-30 DIAGNOSIS — G629 Polyneuropathy, unspecified: Secondary | ICD-10-CM | POA: Diagnosis not present

## 2019-07-10 DIAGNOSIS — G894 Chronic pain syndrome: Secondary | ICD-10-CM | POA: Diagnosis not present

## 2019-07-10 DIAGNOSIS — I10 Essential (primary) hypertension: Secondary | ICD-10-CM | POA: Diagnosis not present

## 2019-07-13 DIAGNOSIS — R350 Frequency of micturition: Secondary | ICD-10-CM | POA: Diagnosis not present

## 2019-07-17 DIAGNOSIS — M5136 Other intervertebral disc degeneration, lumbar region: Secondary | ICD-10-CM | POA: Diagnosis not present

## 2019-07-17 DIAGNOSIS — G894 Chronic pain syndrome: Secondary | ICD-10-CM | POA: Diagnosis not present

## 2019-07-18 DIAGNOSIS — Z9181 History of falling: Secondary | ICD-10-CM | POA: Diagnosis not present

## 2019-07-18 DIAGNOSIS — G4730 Sleep apnea, unspecified: Secondary | ICD-10-CM | POA: Diagnosis not present

## 2019-07-18 DIAGNOSIS — M21371 Foot drop, right foot: Secondary | ICD-10-CM | POA: Diagnosis not present

## 2019-07-18 DIAGNOSIS — M1991 Primary osteoarthritis, unspecified site: Secondary | ICD-10-CM | POA: Diagnosis not present

## 2019-07-18 DIAGNOSIS — Z79891 Long term (current) use of opiate analgesic: Secondary | ICD-10-CM | POA: Diagnosis not present

## 2019-07-18 DIAGNOSIS — G894 Chronic pain syndrome: Secondary | ICD-10-CM | POA: Diagnosis not present

## 2019-07-18 DIAGNOSIS — R338 Other retention of urine: Secondary | ICD-10-CM | POA: Diagnosis not present

## 2019-07-18 DIAGNOSIS — N411 Chronic prostatitis: Secondary | ICD-10-CM | POA: Diagnosis not present

## 2019-07-18 DIAGNOSIS — I10 Essential (primary) hypertension: Secondary | ICD-10-CM | POA: Diagnosis not present

## 2019-07-18 DIAGNOSIS — R202 Paresthesia of skin: Secondary | ICD-10-CM | POA: Diagnosis not present

## 2019-07-18 DIAGNOSIS — Z87440 Personal history of urinary (tract) infections: Secondary | ICD-10-CM | POA: Diagnosis not present

## 2019-07-18 DIAGNOSIS — G629 Polyneuropathy, unspecified: Secondary | ICD-10-CM | POA: Diagnosis not present

## 2019-07-18 DIAGNOSIS — E785 Hyperlipidemia, unspecified: Secondary | ICD-10-CM | POA: Diagnosis not present

## 2019-07-18 DIAGNOSIS — G2581 Restless legs syndrome: Secondary | ICD-10-CM | POA: Diagnosis not present

## 2019-07-20 DIAGNOSIS — R350 Frequency of micturition: Secondary | ICD-10-CM | POA: Diagnosis not present

## 2019-07-20 DIAGNOSIS — R3915 Urgency of urination: Secondary | ICD-10-CM | POA: Diagnosis not present

## 2019-07-21 DIAGNOSIS — M21371 Foot drop, right foot: Secondary | ICD-10-CM | POA: Diagnosis not present

## 2019-07-21 DIAGNOSIS — Z87440 Personal history of urinary (tract) infections: Secondary | ICD-10-CM | POA: Diagnosis not present

## 2019-07-21 DIAGNOSIS — G629 Polyneuropathy, unspecified: Secondary | ICD-10-CM | POA: Diagnosis not present

## 2019-07-21 DIAGNOSIS — Z79891 Long term (current) use of opiate analgesic: Secondary | ICD-10-CM | POA: Diagnosis not present

## 2019-07-21 DIAGNOSIS — N411 Chronic prostatitis: Secondary | ICD-10-CM | POA: Diagnosis not present

## 2019-07-21 DIAGNOSIS — E785 Hyperlipidemia, unspecified: Secondary | ICD-10-CM | POA: Diagnosis not present

## 2019-07-21 DIAGNOSIS — R338 Other retention of urine: Secondary | ICD-10-CM | POA: Diagnosis not present

## 2019-07-21 DIAGNOSIS — G4730 Sleep apnea, unspecified: Secondary | ICD-10-CM | POA: Diagnosis not present

## 2019-07-21 DIAGNOSIS — G2581 Restless legs syndrome: Secondary | ICD-10-CM | POA: Diagnosis not present

## 2019-07-21 DIAGNOSIS — I10 Essential (primary) hypertension: Secondary | ICD-10-CM | POA: Diagnosis not present

## 2019-07-21 DIAGNOSIS — M1991 Primary osteoarthritis, unspecified site: Secondary | ICD-10-CM | POA: Diagnosis not present

## 2019-07-21 DIAGNOSIS — G894 Chronic pain syndrome: Secondary | ICD-10-CM | POA: Diagnosis not present

## 2019-07-21 DIAGNOSIS — Z9181 History of falling: Secondary | ICD-10-CM | POA: Diagnosis not present

## 2019-07-21 DIAGNOSIS — R202 Paresthesia of skin: Secondary | ICD-10-CM | POA: Diagnosis not present

## 2019-07-25 DIAGNOSIS — N411 Chronic prostatitis: Secondary | ICD-10-CM | POA: Diagnosis not present

## 2019-07-25 DIAGNOSIS — E785 Hyperlipidemia, unspecified: Secondary | ICD-10-CM | POA: Diagnosis not present

## 2019-07-25 DIAGNOSIS — G2581 Restless legs syndrome: Secondary | ICD-10-CM | POA: Diagnosis not present

## 2019-07-25 DIAGNOSIS — Z9181 History of falling: Secondary | ICD-10-CM | POA: Diagnosis not present

## 2019-07-25 DIAGNOSIS — G4730 Sleep apnea, unspecified: Secondary | ICD-10-CM | POA: Diagnosis not present

## 2019-07-25 DIAGNOSIS — Z87440 Personal history of urinary (tract) infections: Secondary | ICD-10-CM | POA: Diagnosis not present

## 2019-07-25 DIAGNOSIS — R202 Paresthesia of skin: Secondary | ICD-10-CM | POA: Diagnosis not present

## 2019-07-25 DIAGNOSIS — G629 Polyneuropathy, unspecified: Secondary | ICD-10-CM | POA: Diagnosis not present

## 2019-07-25 DIAGNOSIS — G894 Chronic pain syndrome: Secondary | ICD-10-CM | POA: Diagnosis not present

## 2019-07-25 DIAGNOSIS — Z79891 Long term (current) use of opiate analgesic: Secondary | ICD-10-CM | POA: Diagnosis not present

## 2019-07-25 DIAGNOSIS — M1991 Primary osteoarthritis, unspecified site: Secondary | ICD-10-CM | POA: Diagnosis not present

## 2019-07-25 DIAGNOSIS — M21371 Foot drop, right foot: Secondary | ICD-10-CM | POA: Diagnosis not present

## 2019-07-25 DIAGNOSIS — I10 Essential (primary) hypertension: Secondary | ICD-10-CM | POA: Diagnosis not present

## 2019-07-25 DIAGNOSIS — R338 Other retention of urine: Secondary | ICD-10-CM | POA: Diagnosis not present

## 2019-07-27 DIAGNOSIS — R3915 Urgency of urination: Secondary | ICD-10-CM | POA: Diagnosis not present

## 2019-07-27 DIAGNOSIS — R350 Frequency of micturition: Secondary | ICD-10-CM | POA: Diagnosis not present

## 2019-07-28 DIAGNOSIS — I10 Essential (primary) hypertension: Secondary | ICD-10-CM | POA: Diagnosis not present

## 2019-07-28 DIAGNOSIS — R202 Paresthesia of skin: Secondary | ICD-10-CM | POA: Diagnosis not present

## 2019-07-28 DIAGNOSIS — M21371 Foot drop, right foot: Secondary | ICD-10-CM | POA: Diagnosis not present

## 2019-07-28 DIAGNOSIS — G629 Polyneuropathy, unspecified: Secondary | ICD-10-CM | POA: Diagnosis not present

## 2019-07-28 DIAGNOSIS — R338 Other retention of urine: Secondary | ICD-10-CM | POA: Diagnosis not present

## 2019-07-28 DIAGNOSIS — G4730 Sleep apnea, unspecified: Secondary | ICD-10-CM | POA: Diagnosis not present

## 2019-07-28 DIAGNOSIS — E785 Hyperlipidemia, unspecified: Secondary | ICD-10-CM | POA: Diagnosis not present

## 2019-07-28 DIAGNOSIS — N411 Chronic prostatitis: Secondary | ICD-10-CM | POA: Diagnosis not present

## 2019-07-28 DIAGNOSIS — Z9181 History of falling: Secondary | ICD-10-CM | POA: Diagnosis not present

## 2019-07-28 DIAGNOSIS — Z87440 Personal history of urinary (tract) infections: Secondary | ICD-10-CM | POA: Diagnosis not present

## 2019-07-28 DIAGNOSIS — G2581 Restless legs syndrome: Secondary | ICD-10-CM | POA: Diagnosis not present

## 2019-07-28 DIAGNOSIS — M1991 Primary osteoarthritis, unspecified site: Secondary | ICD-10-CM | POA: Diagnosis not present

## 2019-07-28 DIAGNOSIS — G894 Chronic pain syndrome: Secondary | ICD-10-CM | POA: Diagnosis not present

## 2019-07-28 DIAGNOSIS — Z79891 Long term (current) use of opiate analgesic: Secondary | ICD-10-CM | POA: Diagnosis not present

## 2019-07-31 DIAGNOSIS — G894 Chronic pain syndrome: Secondary | ICD-10-CM | POA: Diagnosis not present

## 2019-07-31 DIAGNOSIS — Z87440 Personal history of urinary (tract) infections: Secondary | ICD-10-CM | POA: Diagnosis not present

## 2019-07-31 DIAGNOSIS — R338 Other retention of urine: Secondary | ICD-10-CM | POA: Diagnosis not present

## 2019-07-31 DIAGNOSIS — G4730 Sleep apnea, unspecified: Secondary | ICD-10-CM | POA: Diagnosis not present

## 2019-07-31 DIAGNOSIS — Z9181 History of falling: Secondary | ICD-10-CM | POA: Diagnosis not present

## 2019-07-31 DIAGNOSIS — G629 Polyneuropathy, unspecified: Secondary | ICD-10-CM | POA: Diagnosis not present

## 2019-07-31 DIAGNOSIS — E785 Hyperlipidemia, unspecified: Secondary | ICD-10-CM | POA: Diagnosis not present

## 2019-07-31 DIAGNOSIS — G2581 Restless legs syndrome: Secondary | ICD-10-CM | POA: Diagnosis not present

## 2019-07-31 DIAGNOSIS — M21371 Foot drop, right foot: Secondary | ICD-10-CM | POA: Diagnosis not present

## 2019-07-31 DIAGNOSIS — R202 Paresthesia of skin: Secondary | ICD-10-CM | POA: Diagnosis not present

## 2019-07-31 DIAGNOSIS — I10 Essential (primary) hypertension: Secondary | ICD-10-CM | POA: Diagnosis not present

## 2019-07-31 DIAGNOSIS — Z79891 Long term (current) use of opiate analgesic: Secondary | ICD-10-CM | POA: Diagnosis not present

## 2019-07-31 DIAGNOSIS — N411 Chronic prostatitis: Secondary | ICD-10-CM | POA: Diagnosis not present

## 2019-07-31 DIAGNOSIS — M1991 Primary osteoarthritis, unspecified site: Secondary | ICD-10-CM | POA: Diagnosis not present

## 2019-08-02 DIAGNOSIS — Z87440 Personal history of urinary (tract) infections: Secondary | ICD-10-CM | POA: Diagnosis not present

## 2019-08-02 DIAGNOSIS — M1991 Primary osteoarthritis, unspecified site: Secondary | ICD-10-CM | POA: Diagnosis not present

## 2019-08-02 DIAGNOSIS — Z79891 Long term (current) use of opiate analgesic: Secondary | ICD-10-CM | POA: Diagnosis not present

## 2019-08-02 DIAGNOSIS — E785 Hyperlipidemia, unspecified: Secondary | ICD-10-CM | POA: Diagnosis not present

## 2019-08-02 DIAGNOSIS — N411 Chronic prostatitis: Secondary | ICD-10-CM | POA: Diagnosis not present

## 2019-08-02 DIAGNOSIS — G2581 Restless legs syndrome: Secondary | ICD-10-CM | POA: Diagnosis not present

## 2019-08-02 DIAGNOSIS — G4730 Sleep apnea, unspecified: Secondary | ICD-10-CM | POA: Diagnosis not present

## 2019-08-02 DIAGNOSIS — M21371 Foot drop, right foot: Secondary | ICD-10-CM | POA: Diagnosis not present

## 2019-08-02 DIAGNOSIS — Z9181 History of falling: Secondary | ICD-10-CM | POA: Diagnosis not present

## 2019-08-02 DIAGNOSIS — G629 Polyneuropathy, unspecified: Secondary | ICD-10-CM | POA: Diagnosis not present

## 2019-08-02 DIAGNOSIS — I10 Essential (primary) hypertension: Secondary | ICD-10-CM | POA: Diagnosis not present

## 2019-08-02 DIAGNOSIS — R338 Other retention of urine: Secondary | ICD-10-CM | POA: Diagnosis not present

## 2019-08-02 DIAGNOSIS — R202 Paresthesia of skin: Secondary | ICD-10-CM | POA: Diagnosis not present

## 2019-08-02 DIAGNOSIS — G894 Chronic pain syndrome: Secondary | ICD-10-CM | POA: Diagnosis not present

## 2019-08-03 DIAGNOSIS — R350 Frequency of micturition: Secondary | ICD-10-CM | POA: Diagnosis not present

## 2019-08-07 DIAGNOSIS — Z87440 Personal history of urinary (tract) infections: Secondary | ICD-10-CM | POA: Diagnosis not present

## 2019-08-07 DIAGNOSIS — I10 Essential (primary) hypertension: Secondary | ICD-10-CM | POA: Diagnosis not present

## 2019-08-07 DIAGNOSIS — E785 Hyperlipidemia, unspecified: Secondary | ICD-10-CM | POA: Diagnosis not present

## 2019-08-07 DIAGNOSIS — M1991 Primary osteoarthritis, unspecified site: Secondary | ICD-10-CM | POA: Diagnosis not present

## 2019-08-07 DIAGNOSIS — M21371 Foot drop, right foot: Secondary | ICD-10-CM | POA: Diagnosis not present

## 2019-08-07 DIAGNOSIS — G4730 Sleep apnea, unspecified: Secondary | ICD-10-CM | POA: Diagnosis not present

## 2019-08-07 DIAGNOSIS — Z79891 Long term (current) use of opiate analgesic: Secondary | ICD-10-CM | POA: Diagnosis not present

## 2019-08-07 DIAGNOSIS — N411 Chronic prostatitis: Secondary | ICD-10-CM | POA: Diagnosis not present

## 2019-08-07 DIAGNOSIS — Z9181 History of falling: Secondary | ICD-10-CM | POA: Diagnosis not present

## 2019-08-07 DIAGNOSIS — G894 Chronic pain syndrome: Secondary | ICD-10-CM | POA: Diagnosis not present

## 2019-08-07 DIAGNOSIS — R338 Other retention of urine: Secondary | ICD-10-CM | POA: Diagnosis not present

## 2019-08-07 DIAGNOSIS — R202 Paresthesia of skin: Secondary | ICD-10-CM | POA: Diagnosis not present

## 2019-08-07 DIAGNOSIS — G629 Polyneuropathy, unspecified: Secondary | ICD-10-CM | POA: Diagnosis not present

## 2019-08-07 DIAGNOSIS — G2581 Restless legs syndrome: Secondary | ICD-10-CM | POA: Diagnosis not present

## 2019-08-09 DIAGNOSIS — E785 Hyperlipidemia, unspecified: Secondary | ICD-10-CM | POA: Diagnosis not present

## 2019-08-09 DIAGNOSIS — Z9181 History of falling: Secondary | ICD-10-CM | POA: Diagnosis not present

## 2019-08-09 DIAGNOSIS — G629 Polyneuropathy, unspecified: Secondary | ICD-10-CM | POA: Diagnosis not present

## 2019-08-09 DIAGNOSIS — Z79891 Long term (current) use of opiate analgesic: Secondary | ICD-10-CM | POA: Diagnosis not present

## 2019-08-09 DIAGNOSIS — G4730 Sleep apnea, unspecified: Secondary | ICD-10-CM | POA: Diagnosis not present

## 2019-08-09 DIAGNOSIS — G894 Chronic pain syndrome: Secondary | ICD-10-CM | POA: Diagnosis not present

## 2019-08-09 DIAGNOSIS — Z87440 Personal history of urinary (tract) infections: Secondary | ICD-10-CM | POA: Diagnosis not present

## 2019-08-09 DIAGNOSIS — N411 Chronic prostatitis: Secondary | ICD-10-CM | POA: Diagnosis not present

## 2019-08-09 DIAGNOSIS — M1991 Primary osteoarthritis, unspecified site: Secondary | ICD-10-CM | POA: Diagnosis not present

## 2019-08-09 DIAGNOSIS — R338 Other retention of urine: Secondary | ICD-10-CM | POA: Diagnosis not present

## 2019-08-09 DIAGNOSIS — I10 Essential (primary) hypertension: Secondary | ICD-10-CM | POA: Diagnosis not present

## 2019-08-09 DIAGNOSIS — R202 Paresthesia of skin: Secondary | ICD-10-CM | POA: Diagnosis not present

## 2019-08-09 DIAGNOSIS — G2581 Restless legs syndrome: Secondary | ICD-10-CM | POA: Diagnosis not present

## 2019-08-09 DIAGNOSIS — M21371 Foot drop, right foot: Secondary | ICD-10-CM | POA: Diagnosis not present

## 2019-08-10 DIAGNOSIS — R350 Frequency of micturition: Secondary | ICD-10-CM | POA: Diagnosis not present

## 2019-08-10 DIAGNOSIS — R3915 Urgency of urination: Secondary | ICD-10-CM | POA: Diagnosis not present

## 2019-08-15 DIAGNOSIS — G2581 Restless legs syndrome: Secondary | ICD-10-CM | POA: Diagnosis not present

## 2019-08-15 DIAGNOSIS — M1991 Primary osteoarthritis, unspecified site: Secondary | ICD-10-CM | POA: Diagnosis not present

## 2019-08-15 DIAGNOSIS — G629 Polyneuropathy, unspecified: Secondary | ICD-10-CM | POA: Diagnosis not present

## 2019-08-15 DIAGNOSIS — N411 Chronic prostatitis: Secondary | ICD-10-CM | POA: Diagnosis not present

## 2019-08-15 DIAGNOSIS — Z79891 Long term (current) use of opiate analgesic: Secondary | ICD-10-CM | POA: Diagnosis not present

## 2019-08-15 DIAGNOSIS — M21371 Foot drop, right foot: Secondary | ICD-10-CM | POA: Diagnosis not present

## 2019-08-15 DIAGNOSIS — Z87440 Personal history of urinary (tract) infections: Secondary | ICD-10-CM | POA: Diagnosis not present

## 2019-08-15 DIAGNOSIS — G894 Chronic pain syndrome: Secondary | ICD-10-CM | POA: Diagnosis not present

## 2019-08-15 DIAGNOSIS — R338 Other retention of urine: Secondary | ICD-10-CM | POA: Diagnosis not present

## 2019-08-15 DIAGNOSIS — I10 Essential (primary) hypertension: Secondary | ICD-10-CM | POA: Diagnosis not present

## 2019-08-15 DIAGNOSIS — R202 Paresthesia of skin: Secondary | ICD-10-CM | POA: Diagnosis not present

## 2019-08-15 DIAGNOSIS — G4730 Sleep apnea, unspecified: Secondary | ICD-10-CM | POA: Diagnosis not present

## 2019-08-15 DIAGNOSIS — E785 Hyperlipidemia, unspecified: Secondary | ICD-10-CM | POA: Diagnosis not present

## 2019-08-15 DIAGNOSIS — Z9181 History of falling: Secondary | ICD-10-CM | POA: Diagnosis not present

## 2019-08-17 DIAGNOSIS — R350 Frequency of micturition: Secondary | ICD-10-CM | POA: Diagnosis not present

## 2019-08-18 DIAGNOSIS — N411 Chronic prostatitis: Secondary | ICD-10-CM | POA: Diagnosis not present

## 2019-08-18 DIAGNOSIS — R202 Paresthesia of skin: Secondary | ICD-10-CM | POA: Diagnosis not present

## 2019-08-18 DIAGNOSIS — R338 Other retention of urine: Secondary | ICD-10-CM | POA: Diagnosis not present

## 2019-08-18 DIAGNOSIS — I10 Essential (primary) hypertension: Secondary | ICD-10-CM | POA: Diagnosis not present

## 2019-08-18 DIAGNOSIS — Z9181 History of falling: Secondary | ICD-10-CM | POA: Diagnosis not present

## 2019-08-18 DIAGNOSIS — E785 Hyperlipidemia, unspecified: Secondary | ICD-10-CM | POA: Diagnosis not present

## 2019-08-18 DIAGNOSIS — G894 Chronic pain syndrome: Secondary | ICD-10-CM | POA: Diagnosis not present

## 2019-08-18 DIAGNOSIS — G629 Polyneuropathy, unspecified: Secondary | ICD-10-CM | POA: Diagnosis not present

## 2019-08-18 DIAGNOSIS — M21371 Foot drop, right foot: Secondary | ICD-10-CM | POA: Diagnosis not present

## 2019-08-18 DIAGNOSIS — M1991 Primary osteoarthritis, unspecified site: Secondary | ICD-10-CM | POA: Diagnosis not present

## 2019-08-18 DIAGNOSIS — Z79891 Long term (current) use of opiate analgesic: Secondary | ICD-10-CM | POA: Diagnosis not present

## 2019-08-18 DIAGNOSIS — G2581 Restless legs syndrome: Secondary | ICD-10-CM | POA: Diagnosis not present

## 2019-08-18 DIAGNOSIS — G4730 Sleep apnea, unspecified: Secondary | ICD-10-CM | POA: Diagnosis not present

## 2019-08-18 DIAGNOSIS — Z87440 Personal history of urinary (tract) infections: Secondary | ICD-10-CM | POA: Diagnosis not present

## 2019-08-21 DIAGNOSIS — E785 Hyperlipidemia, unspecified: Secondary | ICD-10-CM | POA: Diagnosis not present

## 2019-08-21 DIAGNOSIS — Z87440 Personal history of urinary (tract) infections: Secondary | ICD-10-CM | POA: Diagnosis not present

## 2019-08-21 DIAGNOSIS — Z79891 Long term (current) use of opiate analgesic: Secondary | ICD-10-CM | POA: Diagnosis not present

## 2019-08-21 DIAGNOSIS — G894 Chronic pain syndrome: Secondary | ICD-10-CM | POA: Diagnosis not present

## 2019-08-21 DIAGNOSIS — M1991 Primary osteoarthritis, unspecified site: Secondary | ICD-10-CM | POA: Diagnosis not present

## 2019-08-21 DIAGNOSIS — N411 Chronic prostatitis: Secondary | ICD-10-CM | POA: Diagnosis not present

## 2019-08-21 DIAGNOSIS — R202 Paresthesia of skin: Secondary | ICD-10-CM | POA: Diagnosis not present

## 2019-08-21 DIAGNOSIS — I10 Essential (primary) hypertension: Secondary | ICD-10-CM | POA: Diagnosis not present

## 2019-08-21 DIAGNOSIS — G4730 Sleep apnea, unspecified: Secondary | ICD-10-CM | POA: Diagnosis not present

## 2019-08-21 DIAGNOSIS — R338 Other retention of urine: Secondary | ICD-10-CM | POA: Diagnosis not present

## 2019-08-21 DIAGNOSIS — G2581 Restless legs syndrome: Secondary | ICD-10-CM | POA: Diagnosis not present

## 2019-08-21 DIAGNOSIS — M21371 Foot drop, right foot: Secondary | ICD-10-CM | POA: Diagnosis not present

## 2019-08-21 DIAGNOSIS — G629 Polyneuropathy, unspecified: Secondary | ICD-10-CM | POA: Diagnosis not present

## 2019-08-21 DIAGNOSIS — Z9181 History of falling: Secondary | ICD-10-CM | POA: Diagnosis not present

## 2019-08-23 ENCOUNTER — Ambulatory Visit: Payer: Medicare Other | Admitting: Adult Health

## 2019-08-24 ENCOUNTER — Ambulatory Visit (INDEPENDENT_AMBULATORY_CARE_PROVIDER_SITE_OTHER): Payer: Medicare Other | Admitting: Adult Health

## 2019-08-24 ENCOUNTER — Encounter: Payer: Self-pay | Admitting: Adult Health

## 2019-08-24 ENCOUNTER — Other Ambulatory Visit: Payer: Self-pay

## 2019-08-24 VITALS — BP 132/79 | HR 88 | Ht 69.5 in | Wt 183.0 lb

## 2019-08-24 DIAGNOSIS — F411 Generalized anxiety disorder: Secondary | ICD-10-CM

## 2019-08-24 DIAGNOSIS — F4310 Post-traumatic stress disorder, unspecified: Secondary | ICD-10-CM

## 2019-08-24 DIAGNOSIS — F331 Major depressive disorder, recurrent, moderate: Secondary | ICD-10-CM

## 2019-08-24 DIAGNOSIS — G4700 Insomnia, unspecified: Secondary | ICD-10-CM | POA: Diagnosis not present

## 2019-08-24 DIAGNOSIS — R350 Frequency of micturition: Secondary | ICD-10-CM | POA: Diagnosis not present

## 2019-08-24 MED ORDER — RISPERIDONE 1 MG PO TABS: ORAL_TABLET | ORAL | 2 refills | Status: DC

## 2019-08-24 NOTE — Progress Notes (Signed)
[facility] MD/PA/NP Initial Note  08/24/2019 5:53 PM Luke Foster  MRN:  [id number]  Chief Complaint:   HPI:   Describes mood today as "not so good". Pleasant. Tearful. Mood symptoms - reports depression, anxiety, and irritability. Stating "I don't feel like the Effexor is helping me". Has taken Effexor for past three years. Depression varies - "rides a wave" - 7 to 11. Wakes up and goes to sleep "teary eyed". Anxiety "comes and goes, but is always underlying" - 4. Irritability depends on what's going - "nothing is ever the same". Stating "if I can get my mind to slow down, maybe things would be better". Feels exhausted. Varying interest and motivation. Taking medications as prescribed.  Energy levels low. Active, does not have a regular exercise routine.  Enjoys some usual interests and activities. Lives alone with 22 year old dog. Married for 27 years - then divorced. He and ex-wife still talking. Has tried dating, but it hasn't worked out. Has a brother and 2 sisters. Family local. Spending time with family. Appetite adequate. Weight stable - 183 pounds.  Sleeps better some nights than others. Averages 3 to 4 hours. Denies daytime napping.  Focus and concentration stable. Completing tasks. Managing aspects of household. Disabled since 2007. Chronic pain. Back and neck injuries - childhood and adulthood.   Denies SI or HI. Denies AH or VH. Previous therapist - Ardeen Jourdain.  Previous medication trials: Effexor XR 225mg  daily  Visit Diagnosis:    ICD-10-CM   1. Insomnia, unspecified type  G47.00 risperiDONE (RISPERDAL) 1 MG tablet  2. Generalized anxiety disorder  F41.1   3. Major depressive disorder, recurrent episode, moderate (HCC)  F33.1 risperiDONE (RISPERDAL) 1 MG tablet  4. PTSD (post-traumatic stress disorder)  F43.10     Past Psychiatric History: Denies psychiatric hospitalization.  Past Medical History:  Past Medical History:  Diagnosis Date  . Arthritis   . Chronic back  pain   . Chronic pain 1999   Bilateral feet (R >L)  . Difficult or painful urination   . High blood pressure   . History of kidney stones    H/O  . Nerve damage   . Pain management   . Paresthesia of foot, bilateral   . Sleep apnea    USES CPAP  . Weakness of both legs     Past Surgical History:  Procedure Laterality Date  . arm surgery    . BACK SURGERY     X2  . FACIAL COSMETIC SURGERY    . HERNIA REPAIR    . IR US GUIDE BX ASP/DRAIN  10/19/2017  . KIDNEY STONE SURGERY    . LUMBAR SPINAL CORD SIMULATOR LEAD REMOVAL N/A 03/01/2019   Procedure: LUMBAR SPINAL CORD SIMULATOR LEAD REMOVAL;  Surgeon: Meade Maw, MD;  Location: [facility] ORS;  Service: Neurosurgery;  Laterality: N/A;  . MASTECTOMY Left   . SPINAL CORD STIMULATOR REMOVAL N/A 03/01/2019   Procedure: LUMBAR SPINAL CORD STIMULATOR REMOVAL;  Surgeon: Meade Maw, MD;  Location: [facility] ORS;  Service: Neurosurgery;  Laterality: N/A;    Family Psychiatric History: Denies any family history of mental illness.  Family History:  Family History  Problem Relation Age of Onset  . Heart disease Father        Living, 27  . Breast cancer Mother        Living, 71  . Hypercholesterolemia Mother   . Healthy Brother   . Healthy Sister     Social History:  Social History  Socioeconomic History  . Marital status: Single    Spouse name: Not on file  . Number of children: 0  . Years of education: Not on file  . Highest education level: High school graduate  Occupational History  . Not on file  Tobacco Use  . Smoking status: Former Smoker    Packs/day: 2.00    Years: 10.00    Pack years: 20.00    Types: Cigarettes    Quit date: 05/21/1991    Years since quitting: 28.2  . Smokeless tobacco: Former Systems developer    Quit date: 06/08/1991  Vaping Use  . Vaping Use: Never used  Substance and Sexual Activity  . Alcohol use: No  . Drug use: No  . Sexual activity: Not on file  Other Topics Concern  . Not on file  Social  History Narrative   Lives with wife.   He is on disability since 2011 due to chronic back pain.      Caffeine - one mountain dew in the morning    Social Determinants of Health   Financial Resource Strain:   . Difficulty of Paying Living Expenses:   Food Insecurity:   . Worried About Charity fundraiser in the Last Year:   . Arboriculturist in the Last Year:   Transportation Needs:   . Film/video editor (Medical):   Marland Kitchen Lack of Transportation (Non-Medical):   Physical Activity:   . Days of Exercise per Week:   . Minutes of Exercise per Session:   Stress:   . Feeling of Stress :   Social Connections:   . Frequency of Communication with Friends and Family:   . Frequency of Social Gatherings with Friends and Family:   . Attends Religious Services:   . Active Member of Clubs or Organizations:   . Attends Archivist Meetings:   Marland Kitchen Marital Status:     Allergies:  Allergies  Allergen Reactions  . Erythromycin Itching    Metabolic Disorder Labs: No results found for: HGBA1C, MPG No results found for: PROLACTIN No results found for: CHOL, TRIG, HDL, CHOLHDL, VLDL, LDLCALC Lab Results  Component Value Date   TSH 0.113 (L) 03/09/2019   TSH 0.812 12/09/2012    Therapeutic Level Labs: No results found for: LITHIUM No results found for: VALPROATE No components found for:  CBMZ  Current Medications: Current Outpatient Medications  Medication Sig Dispense Refill  . cyclobenzaprine (FLEXERIL) 10 MG tablet Take 10 mg by mouth 2 (two) times daily as needed for muscle spasms.     . diazepam (VALIUM) 10 MG tablet Take 10 mg by mouth 2 (two) times daily as needed. For restless leg    . diltiazem (CARDIZEM CD) 120 MG 24 hr capsule Take 120 mg by mouth every evening.     . enalapril (VASOTEC) 5 MG tablet Take 5 mg by mouth 2 (two) times daily.     Marland Kitchen lidocaine (LIDODERM) 5 % Place 1 patch onto the skin daily as needed (pain.). Remove & Discard patch within 12 hours or as  directed by MD     . mirabegron ER (MYRBETRIQ) 50 MG TB24 tablet Take by mouth.    . oxybutynin (DITROPAN) 5 MG tablet Take 1 tablet (5 mg total) by mouth 3 (three) times daily. X 5 days (Patient not taking: Reported on 05/09/2019) 15 tablet 0  . Oxycodone HCl 10 MG TABS Take 1 tablet (10 mg total) by mouth every 6 (six) hours as needed  for moderate pain or severe pain. 12 tablet 0  . risperiDONE (RISPERDAL) 1 MG tablet Take 1/2 tablet at bedtime for 7 nights, then one tablet at bedtime. 30 tablet 2  . tamsulosin (FLOMAX) 0.4 MG CAPS capsule Take 0.4 mg by mouth every evening.     . venlafaxine XR (EFFEXOR-XR) 75 MG 24 hr capsule Take 75 mg by mouth 3 (three) times daily.     . vitamin B-12 (CYANOCOBALAMIN) 500 MCG tablet Take 1 tablet (500 mcg total) by mouth daily.     No current facility-administered medications for this visit.    Medication Side Effects: none  Orders placed this visit:  No orders of the defined types were placed in this encounter.   Psychiatric Specialty Exam:  Review of Systems  Blood pressure 132/79, pulse 88, height 5' 9.5" (1.765 m), weight 183 lb (83 kg).Body mass index is 26.64 kg/m.  General Appearance: Casual, Neat and Well Groomed  Eye Contact:  Good  Speech:  Blocked and Normal Rate  Volume:  Normal  Mood:  Anxious, Depressed and Irritable  Affect:  Appropriate and Congruent  Thought Process:  Coherent and Descriptions of Associations: Intact  Orientation:  Full (Time, Place, and Person)  Thought Content: Logical   Suicidal Thoughts:  No  Homicidal Thoughts:  No  Memory:  WNL  Judgement:  Good  Insight:  Good  Psychomotor Activity:  Normal  Concentration:  Concentration: Good  Recall:  Good  Fund of Knowledge: Good  Language: Good  Assets:  Communication Skills Desire for Improvement Financial Resources/Insurance Housing Intimacy Leisure Time Physical Health Resilience Social Support Talents/Skills Transportation Vocational/Educational   ADL's:  Intact  Cognition: WNL  Prognosis:  Good   Screenings: None  Receiving Psychotherapy: No   Treatment Plan/Recommendations:   Plan:  PDMP reviewed  1. Continue Effexor 225mg  daily 2. Add Risperdal 1mg  - 1/2 at hs x 7, then one at hs  RTC 4 weeks  Patient advised to contact office with any questions, adverse effects, or acute worsening in signs and symptoms.  Discussed potential metabolic side effects associated with atypical antipsychotics, as well as potential risk for movement side effects. Advised pt to contact office if movement side effects occur.       Aloha Gell, NP

## 2019-08-25 DIAGNOSIS — G4730 Sleep apnea, unspecified: Secondary | ICD-10-CM | POA: Diagnosis not present

## 2019-08-25 DIAGNOSIS — G629 Polyneuropathy, unspecified: Secondary | ICD-10-CM | POA: Diagnosis not present

## 2019-08-25 DIAGNOSIS — M21371 Foot drop, right foot: Secondary | ICD-10-CM | POA: Diagnosis not present

## 2019-08-25 DIAGNOSIS — Z9181 History of falling: Secondary | ICD-10-CM | POA: Diagnosis not present

## 2019-08-25 DIAGNOSIS — Z87440 Personal history of urinary (tract) infections: Secondary | ICD-10-CM | POA: Diagnosis not present

## 2019-08-25 DIAGNOSIS — G894 Chronic pain syndrome: Secondary | ICD-10-CM | POA: Diagnosis not present

## 2019-08-25 DIAGNOSIS — Z79891 Long term (current) use of opiate analgesic: Secondary | ICD-10-CM | POA: Diagnosis not present

## 2019-08-25 DIAGNOSIS — R202 Paresthesia of skin: Secondary | ICD-10-CM | POA: Diagnosis not present

## 2019-08-25 DIAGNOSIS — E785 Hyperlipidemia, unspecified: Secondary | ICD-10-CM | POA: Diagnosis not present

## 2019-08-25 DIAGNOSIS — I10 Essential (primary) hypertension: Secondary | ICD-10-CM | POA: Diagnosis not present

## 2019-08-25 DIAGNOSIS — M1991 Primary osteoarthritis, unspecified site: Secondary | ICD-10-CM | POA: Diagnosis not present

## 2019-08-25 DIAGNOSIS — G2581 Restless legs syndrome: Secondary | ICD-10-CM | POA: Diagnosis not present

## 2019-08-25 DIAGNOSIS — N411 Chronic prostatitis: Secondary | ICD-10-CM | POA: Diagnosis not present

## 2019-08-25 DIAGNOSIS — R338 Other retention of urine: Secondary | ICD-10-CM | POA: Diagnosis not present

## 2019-08-29 DIAGNOSIS — I10 Essential (primary) hypertension: Secondary | ICD-10-CM | POA: Diagnosis not present

## 2019-08-29 DIAGNOSIS — Z79891 Long term (current) use of opiate analgesic: Secondary | ICD-10-CM | POA: Diagnosis not present

## 2019-08-29 DIAGNOSIS — Z9181 History of falling: Secondary | ICD-10-CM | POA: Diagnosis not present

## 2019-08-29 DIAGNOSIS — G4730 Sleep apnea, unspecified: Secondary | ICD-10-CM | POA: Diagnosis not present

## 2019-08-29 DIAGNOSIS — R338 Other retention of urine: Secondary | ICD-10-CM | POA: Diagnosis not present

## 2019-08-29 DIAGNOSIS — R202 Paresthesia of skin: Secondary | ICD-10-CM | POA: Diagnosis not present

## 2019-08-29 DIAGNOSIS — M21371 Foot drop, right foot: Secondary | ICD-10-CM | POA: Diagnosis not present

## 2019-08-29 DIAGNOSIS — M1991 Primary osteoarthritis, unspecified site: Secondary | ICD-10-CM | POA: Diagnosis not present

## 2019-08-29 DIAGNOSIS — N411 Chronic prostatitis: Secondary | ICD-10-CM | POA: Diagnosis not present

## 2019-08-29 DIAGNOSIS — E785 Hyperlipidemia, unspecified: Secondary | ICD-10-CM | POA: Diagnosis not present

## 2019-08-29 DIAGNOSIS — G629 Polyneuropathy, unspecified: Secondary | ICD-10-CM | POA: Diagnosis not present

## 2019-08-29 DIAGNOSIS — Z87440 Personal history of urinary (tract) infections: Secondary | ICD-10-CM | POA: Diagnosis not present

## 2019-08-29 DIAGNOSIS — G894 Chronic pain syndrome: Secondary | ICD-10-CM | POA: Diagnosis not present

## 2019-08-29 DIAGNOSIS — G2581 Restless legs syndrome: Secondary | ICD-10-CM | POA: Diagnosis not present

## 2019-08-31 DIAGNOSIS — M5136 Other intervertebral disc degeneration, lumbar region: Secondary | ICD-10-CM | POA: Diagnosis not present

## 2019-08-31 DIAGNOSIS — G894 Chronic pain syndrome: Secondary | ICD-10-CM | POA: Diagnosis not present

## 2019-09-05 DIAGNOSIS — R350 Frequency of micturition: Secondary | ICD-10-CM | POA: Diagnosis not present

## 2019-09-08 DIAGNOSIS — Z87440 Personal history of urinary (tract) infections: Secondary | ICD-10-CM | POA: Diagnosis not present

## 2019-09-08 DIAGNOSIS — G2581 Restless legs syndrome: Secondary | ICD-10-CM | POA: Diagnosis not present

## 2019-09-08 DIAGNOSIS — Z9181 History of falling: Secondary | ICD-10-CM | POA: Diagnosis not present

## 2019-09-08 DIAGNOSIS — G894 Chronic pain syndrome: Secondary | ICD-10-CM | POA: Diagnosis not present

## 2019-09-08 DIAGNOSIS — I10 Essential (primary) hypertension: Secondary | ICD-10-CM | POA: Diagnosis not present

## 2019-09-08 DIAGNOSIS — G629 Polyneuropathy, unspecified: Secondary | ICD-10-CM | POA: Diagnosis not present

## 2019-09-08 DIAGNOSIS — R338 Other retention of urine: Secondary | ICD-10-CM | POA: Diagnosis not present

## 2019-09-08 DIAGNOSIS — G4730 Sleep apnea, unspecified: Secondary | ICD-10-CM | POA: Diagnosis not present

## 2019-09-08 DIAGNOSIS — M1991 Primary osteoarthritis, unspecified site: Secondary | ICD-10-CM | POA: Diagnosis not present

## 2019-09-08 DIAGNOSIS — Z79891 Long term (current) use of opiate analgesic: Secondary | ICD-10-CM | POA: Diagnosis not present

## 2019-09-08 DIAGNOSIS — M21371 Foot drop, right foot: Secondary | ICD-10-CM | POA: Diagnosis not present

## 2019-09-08 DIAGNOSIS — E785 Hyperlipidemia, unspecified: Secondary | ICD-10-CM | POA: Diagnosis not present

## 2019-09-08 DIAGNOSIS — R202 Paresthesia of skin: Secondary | ICD-10-CM | POA: Diagnosis not present

## 2019-09-12 DIAGNOSIS — R202 Paresthesia of skin: Secondary | ICD-10-CM | POA: Diagnosis not present

## 2019-09-12 DIAGNOSIS — Z87440 Personal history of urinary (tract) infections: Secondary | ICD-10-CM | POA: Diagnosis not present

## 2019-09-12 DIAGNOSIS — M1991 Primary osteoarthritis, unspecified site: Secondary | ICD-10-CM | POA: Diagnosis not present

## 2019-09-12 DIAGNOSIS — Z9181 History of falling: Secondary | ICD-10-CM | POA: Diagnosis not present

## 2019-09-12 DIAGNOSIS — G2581 Restless legs syndrome: Secondary | ICD-10-CM | POA: Diagnosis not present

## 2019-09-12 DIAGNOSIS — Z79891 Long term (current) use of opiate analgesic: Secondary | ICD-10-CM | POA: Diagnosis not present

## 2019-09-12 DIAGNOSIS — R338 Other retention of urine: Secondary | ICD-10-CM | POA: Diagnosis not present

## 2019-09-12 DIAGNOSIS — I10 Essential (primary) hypertension: Secondary | ICD-10-CM | POA: Diagnosis not present

## 2019-09-12 DIAGNOSIS — G629 Polyneuropathy, unspecified: Secondary | ICD-10-CM | POA: Diagnosis not present

## 2019-09-12 DIAGNOSIS — G4730 Sleep apnea, unspecified: Secondary | ICD-10-CM | POA: Diagnosis not present

## 2019-09-12 DIAGNOSIS — M21371 Foot drop, right foot: Secondary | ICD-10-CM | POA: Diagnosis not present

## 2019-09-12 DIAGNOSIS — E785 Hyperlipidemia, unspecified: Secondary | ICD-10-CM | POA: Diagnosis not present

## 2019-09-12 DIAGNOSIS — G894 Chronic pain syndrome: Secondary | ICD-10-CM | POA: Diagnosis not present

## 2019-09-14 DIAGNOSIS — R3915 Urgency of urination: Secondary | ICD-10-CM | POA: Diagnosis not present

## 2019-09-14 DIAGNOSIS — R350 Frequency of micturition: Secondary | ICD-10-CM | POA: Diagnosis not present

## 2019-09-15 DIAGNOSIS — G629 Polyneuropathy, unspecified: Secondary | ICD-10-CM | POA: Diagnosis not present

## 2019-09-15 DIAGNOSIS — G2581 Restless legs syndrome: Secondary | ICD-10-CM | POA: Diagnosis not present

## 2019-09-15 DIAGNOSIS — R338 Other retention of urine: Secondary | ICD-10-CM | POA: Diagnosis not present

## 2019-09-15 DIAGNOSIS — I10 Essential (primary) hypertension: Secondary | ICD-10-CM | POA: Diagnosis not present

## 2019-09-15 DIAGNOSIS — M21371 Foot drop, right foot: Secondary | ICD-10-CM | POA: Diagnosis not present

## 2019-09-15 DIAGNOSIS — M1991 Primary osteoarthritis, unspecified site: Secondary | ICD-10-CM | POA: Diagnosis not present

## 2019-09-15 DIAGNOSIS — Z9181 History of falling: Secondary | ICD-10-CM | POA: Diagnosis not present

## 2019-09-15 DIAGNOSIS — Z79891 Long term (current) use of opiate analgesic: Secondary | ICD-10-CM | POA: Diagnosis not present

## 2019-09-15 DIAGNOSIS — E785 Hyperlipidemia, unspecified: Secondary | ICD-10-CM | POA: Diagnosis not present

## 2019-09-15 DIAGNOSIS — R202 Paresthesia of skin: Secondary | ICD-10-CM | POA: Diagnosis not present

## 2019-09-15 DIAGNOSIS — G4730 Sleep apnea, unspecified: Secondary | ICD-10-CM | POA: Diagnosis not present

## 2019-09-15 DIAGNOSIS — Z87440 Personal history of urinary (tract) infections: Secondary | ICD-10-CM | POA: Diagnosis not present

## 2019-09-15 DIAGNOSIS — G894 Chronic pain syndrome: Secondary | ICD-10-CM | POA: Diagnosis not present

## 2019-09-16 ENCOUNTER — Other Ambulatory Visit: Payer: Self-pay | Admitting: Adult Health

## 2019-09-16 DIAGNOSIS — G4700 Insomnia, unspecified: Secondary | ICD-10-CM

## 2019-09-16 DIAGNOSIS — F331 Major depressive disorder, recurrent, moderate: Secondary | ICD-10-CM

## 2019-09-18 NOTE — Telephone Encounter (Signed)
Refill not due yet, has apt tomorrow 08/10

## 2019-09-19 ENCOUNTER — Ambulatory Visit (INDEPENDENT_AMBULATORY_CARE_PROVIDER_SITE_OTHER): Payer: Medicare Other | Admitting: Adult Health

## 2019-09-19 ENCOUNTER — Other Ambulatory Visit: Payer: Self-pay

## 2019-09-19 ENCOUNTER — Encounter: Payer: Self-pay | Admitting: Adult Health

## 2019-09-19 DIAGNOSIS — G4700 Insomnia, unspecified: Secondary | ICD-10-CM

## 2019-09-19 DIAGNOSIS — F331 Major depressive disorder, recurrent, moderate: Secondary | ICD-10-CM

## 2019-09-19 DIAGNOSIS — F411 Generalized anxiety disorder: Secondary | ICD-10-CM

## 2019-09-19 DIAGNOSIS — F4310 Post-traumatic stress disorder, unspecified: Secondary | ICD-10-CM

## 2019-09-19 MED ORDER — ARIPIPRAZOLE 5 MG PO TABS: 5.0000 mg | ORAL_TABLET | Freq: Every day | ORAL | 2 refills | Status: DC

## 2019-09-19 NOTE — Progress Notes (Signed)
Luke Foster [id number] [birth year] 69 y.o.  Subjective:   Patient ID:  Luke Foster is a 69 y.o. (DOB Nov 04, 1950) male.  Chief Complaint: No chief complaint on file.   HPI Luke Foster presents to the office today for follow-up of MDD, GAD, PTSD, and insomnia.  Describes mood today as "about the same". Pleasant. Tearful - "a lot". Mood symptoms - reports depression and anxiety. Denies feeling irritable - "pretty laid back". Mind has "slowed down a little, but has a lot to carry". Does not feel like the addition of Risperdal has been helpful, but is willing to try another medication. Reports 2 falls since last visit - during the night. Varying interest and motivation. Taking medications as prescribed.  Energy levels low. Active, does not have a regular exercise routine with current physical disabilities.  Enjoys some usual interests and activities. Divorced. Lives alone with 81 year old dog. Has a brother and 2 sisters. Brother helping with medications and driving him to appointments. Spending time with family. Appetite adequate. Weight stable - 183 pounds.  Sleeps better some nights than others. Averages 3 to 4 hours. Denies daytime napping.  Focus and concentration stable. Completing tasks. Managing aspects of household. Disabled since 2007. Chronic pain. Back and neck injuries - childhood and adulthood.   Denies SI or HI. Denies AH or VH.  Previous medication trials: Effexor XR 225mg  daily, Risperdal - not helpful   Review of Systems:  Review of Systems  Musculoskeletal: Negative for gait problem.  Neurological: Negative for tremors.  Psychiatric/Behavioral:       Please refer to HPI    Medications: I have reviewed the patient's current medications.  Current Outpatient Medications  Medication Sig Dispense Refill  . ARIPiprazole (ABILIFY) 5 MG tablet Take 1 tablet (5 mg total) by mouth daily. 30 tablet 2  . cyclobenzaprine (FLEXERIL) 10 MG tablet Take 10 mg by mouth 2 (two)  times daily as needed for muscle spasms.     . diazepam (VALIUM) 10 MG tablet Take 10 mg by mouth 2 (two) times daily as needed. For restless leg    . diltiazem (CARDIZEM CD) 120 MG 24 hr capsule Take 120 mg by mouth every evening.     . enalapril (VASOTEC) 5 MG tablet Take 5 mg by mouth 2 (two) times daily.     Marland Kitchen lidocaine (LIDODERM) 5 % Place 1 patch onto the skin daily as needed (pain.). Remove & Discard patch within 12 hours or as directed by MD     . mirabegron ER (MYRBETRIQ) 50 MG TB24 tablet Take by mouth.    . oxybutynin (DITROPAN) 5 MG tablet Take 1 tablet (5 mg total) by mouth 3 (three) times daily. X 5 days (Patient not taking: Reported on 05/09/2019) 15 tablet 0  . Oxycodone HCl 10 MG TABS Take 1 tablet (10 mg total) by mouth every 6 (six) hours as needed for moderate pain or severe pain. 12 tablet 0  . tamsulosin (FLOMAX) 0.4 MG CAPS capsule Take 0.4 mg by mouth every evening.     . venlafaxine XR (EFFEXOR-XR) 75 MG 24 hr capsule Take 75 mg by mouth 3 (three) times daily.     . vitamin B-12 (CYANOCOBALAMIN) 500 MCG tablet Take 1 tablet (500 mcg total) by mouth daily.     No current facility-administered medications for this visit.    Medication Side Effects: None  Allergies:  Allergies  Allergen Reactions  . Erythromycin Itching    Past Medical History:  Diagnosis Date  . Arthritis   . Chronic back pain   . Chronic pain 1999   Bilateral feet (R >L)  . Difficult or painful urination   . High blood pressure   . History of kidney stones    H/O  . Nerve damage   . Pain management   . Paresthesia of foot, bilateral   . Sleep apnea    USES CPAP  . Weakness of both legs     Family History  Problem Relation Age of Onset  . Heart disease Father        Living, 55  . Breast cancer Mother        Living, 25  . Hypercholesterolemia Mother   . Healthy Brother   . Healthy Sister     Social History   Socioeconomic History  . Marital status: Single    Spouse name:  Not on file  . Number of children: 0  . Years of education: Not on file  . Highest education level: High school graduate  Occupational History  . Not on file  Tobacco Use  . Smoking status: Former Smoker    Packs/day: 2.00    Years: 10.00    Pack years: 20.00    Types: Cigarettes    Quit date: 05/21/1991    Years since quitting: 28.3  . Smokeless tobacco: Former Systems developer    Quit date: 06/08/1991  Vaping Use  . Vaping Use: Never used  Substance and Sexual Activity  . Alcohol use: No  . Drug use: No  . Sexual activity: Not on file  Other Topics Concern  . Not on file  Social History Narrative   Lives with wife.   He is on disability since 2011 due to chronic back pain.      Caffeine - one mountain dew in the morning    Social Determinants of Health   Financial Resource Strain:   . Difficulty of Paying Living Expenses:   Food Insecurity:   . Worried About Charity fundraiser in the Last Year:   . Arboriculturist in the Last Year:   Transportation Needs:   . Film/video editor (Medical):   Marland Kitchen Lack of Transportation (Non-Medical):   Physical Activity:   . Days of Exercise per Week:   . Minutes of Exercise per Session:   Stress:   . Feeling of Stress :   Social Connections:   . Frequency of Communication with Friends and Family:   . Frequency of Social Gatherings with Friends and Family:   . Attends Religious Services:   . Active Member of Clubs or Organizations:   . Attends Archivist Meetings:   Marland Kitchen Marital Status:   Intimate Partner Violence:   . Fear of Current or Ex-Partner:   . Emotionally Abused:   Marland Kitchen Physically Abused:   . Sexually Abused:     Past Medical History, Surgical history, Social history, and Family history were reviewed and updated as appropriate.   Please see review of systems for further details on the patient's review from today.   Objective:   Physical Exam:  There were no vitals taken for this visit.  Physical  Exam Constitutional:      General: He is not in acute distress. Musculoskeletal:        General: No deformity.  Neurological:     Mental Status: He is alert and oriented to person, place, and time.     Coordination: Coordination normal.  Psychiatric:  Attention and Perception: Attention and perception normal. He does not perceive auditory or visual hallucinations.        Mood and Affect: Mood is anxious and depressed. Affect is not labile, blunt, angry or inappropriate.        Speech: Speech normal.        Behavior: Behavior normal.        Thought Content: Thought content normal. Thought content is not paranoid or delusional. Thought content does not include homicidal or suicidal ideation. Thought content does not include homicidal or suicidal plan.        Cognition and Memory: Cognition and memory normal.        Judgment: Judgment normal.     Comments: Insight intact     Lab Review:     Component Value Date/Time   NA 137 04/26/2019 1721   K 3.7 04/26/2019 1721   CL 104 04/26/2019 1721   CO2 26 04/26/2019 1721   GLUCOSE 91 04/26/2019 1721   BUN 18 04/26/2019 1721   CREATININE 1.17 04/26/2019 1721   CALCIUM 9.2 04/26/2019 1721   PROT 7.3 04/26/2019 1721   ALBUMIN 3.6 04/26/2019 1721   AST 17 04/26/2019 1721   ALT 14 04/26/2019 1721   ALKPHOS 121 04/26/2019 1721   BILITOT 0.4 04/26/2019 1721   GFRNONAA >60 04/26/2019 1721   GFRAA >60 04/26/2019 1721       Component Value Date/Time   WBC 8.8 04/26/2019 1721   RBC 4.59 04/26/2019 1721   HGB 13.4 04/26/2019 1721   HCT 42.1 04/26/2019 1721   PLT 231 04/26/2019 1721   MCV 91.7 04/26/2019 1721   MCH 29.2 04/26/2019 1721   MCHC 31.8 04/26/2019 1721   RDW 12.8 04/26/2019 1721   LYMPHSABS 1.1 04/26/2019 1721   MONOABS 0.7 04/26/2019 1721   EOSABS 0.1 04/26/2019 1721   BASOSABS 0.1 04/26/2019 1721    No results found for: POCLITH, LITHIUM   No results found for: PHENYTOIN, PHENOBARB, VALPROATE, CBMZ    .res Assessment: Plan:    Plan:  PDMP reviewed  1. Continue Effexor 225mg  daily 2. D/C Risperdal 1mg  at hs - ineffective 3. Add Abilify 5mg  daily - 1/2 tab daily x 7 days   Discussed plan with brother  RTC 4 weeks  Patient advised to contact office with any questions, adverse effects, or acute worsening in signs and symptoms.  Discussed potential metabolic side effects associated with atypical antipsychotics, as well as potential risk for movement side effects. Advised pt to contact office if movement side effects occur.     Diagnoses and all orders for this visit:  PTSD (post-traumatic stress disorder) -     ARIPiprazole (ABILIFY) 5 MG tablet; Take 1 tablet (5 mg total) by mouth daily.  Major depressive disorder, recurrent episode, moderate (HCC) -     ARIPiprazole (ABILIFY) 5 MG tablet; Take 1 tablet (5 mg total) by mouth daily.  Generalized anxiety disorder -     ARIPiprazole (ABILIFY) 5 MG tablet; Take 1 tablet (5 mg total) by mouth daily.  Insomnia, unspecified type -     ARIPiprazole (ABILIFY) 5 MG tablet; Take 1 tablet (5 mg total) by mouth daily.     Please see After Visit Summary for patient specific instructions.  Future Appointments  Date Time Provider [place]  10/17/2019 11:40 AM Wolfgang Finigan, Berdie Ogren, NP [facility] None    No orders of the defined types were placed in this encounter.   -------------------------------

## 2019-09-22 DIAGNOSIS — M21371 Foot drop, right foot: Secondary | ICD-10-CM | POA: Diagnosis not present

## 2019-09-22 DIAGNOSIS — R202 Paresthesia of skin: Secondary | ICD-10-CM | POA: Diagnosis not present

## 2019-09-22 DIAGNOSIS — G629 Polyneuropathy, unspecified: Secondary | ICD-10-CM | POA: Diagnosis not present

## 2019-09-22 DIAGNOSIS — G894 Chronic pain syndrome: Secondary | ICD-10-CM | POA: Diagnosis not present

## 2019-09-22 DIAGNOSIS — I10 Essential (primary) hypertension: Secondary | ICD-10-CM | POA: Diagnosis not present

## 2019-09-22 DIAGNOSIS — R338 Other retention of urine: Secondary | ICD-10-CM | POA: Diagnosis not present

## 2019-09-22 DIAGNOSIS — G4730 Sleep apnea, unspecified: Secondary | ICD-10-CM | POA: Diagnosis not present

## 2019-09-22 DIAGNOSIS — Z87440 Personal history of urinary (tract) infections: Secondary | ICD-10-CM | POA: Diagnosis not present

## 2019-09-22 DIAGNOSIS — Z9181 History of falling: Secondary | ICD-10-CM | POA: Diagnosis not present

## 2019-09-22 DIAGNOSIS — G2581 Restless legs syndrome: Secondary | ICD-10-CM | POA: Diagnosis not present

## 2019-09-22 DIAGNOSIS — M1991 Primary osteoarthritis, unspecified site: Secondary | ICD-10-CM | POA: Diagnosis not present

## 2019-09-22 DIAGNOSIS — Z79891 Long term (current) use of opiate analgesic: Secondary | ICD-10-CM | POA: Diagnosis not present

## 2019-09-22 DIAGNOSIS — E785 Hyperlipidemia, unspecified: Secondary | ICD-10-CM | POA: Diagnosis not present

## 2019-09-25 DIAGNOSIS — M6281 Muscle weakness (generalized): Secondary | ICD-10-CM | POA: Diagnosis not present

## 2019-09-25 DIAGNOSIS — G894 Chronic pain syndrome: Secondary | ICD-10-CM | POA: Diagnosis not present

## 2019-09-25 DIAGNOSIS — R531 Weakness: Secondary | ICD-10-CM | POA: Diagnosis not present

## 2019-09-27 DIAGNOSIS — G4730 Sleep apnea, unspecified: Secondary | ICD-10-CM | POA: Diagnosis not present

## 2019-09-27 DIAGNOSIS — G894 Chronic pain syndrome: Secondary | ICD-10-CM | POA: Diagnosis not present

## 2019-09-27 DIAGNOSIS — G629 Polyneuropathy, unspecified: Secondary | ICD-10-CM | POA: Diagnosis not present

## 2019-09-27 DIAGNOSIS — Z87440 Personal history of urinary (tract) infections: Secondary | ICD-10-CM | POA: Diagnosis not present

## 2019-09-27 DIAGNOSIS — R338 Other retention of urine: Secondary | ICD-10-CM | POA: Diagnosis not present

## 2019-09-27 DIAGNOSIS — E785 Hyperlipidemia, unspecified: Secondary | ICD-10-CM | POA: Diagnosis not present

## 2019-09-27 DIAGNOSIS — I10 Essential (primary) hypertension: Secondary | ICD-10-CM | POA: Diagnosis not present

## 2019-09-27 DIAGNOSIS — M1991 Primary osteoarthritis, unspecified site: Secondary | ICD-10-CM | POA: Diagnosis not present

## 2019-09-27 DIAGNOSIS — G2581 Restless legs syndrome: Secondary | ICD-10-CM | POA: Diagnosis not present

## 2019-09-27 DIAGNOSIS — Z9181 History of falling: Secondary | ICD-10-CM | POA: Diagnosis not present

## 2019-09-27 DIAGNOSIS — Z79891 Long term (current) use of opiate analgesic: Secondary | ICD-10-CM | POA: Diagnosis not present

## 2019-09-27 DIAGNOSIS — R202 Paresthesia of skin: Secondary | ICD-10-CM | POA: Diagnosis not present

## 2019-09-27 DIAGNOSIS — M21371 Foot drop, right foot: Secondary | ICD-10-CM | POA: Diagnosis not present

## 2019-10-04 DIAGNOSIS — R202 Paresthesia of skin: Secondary | ICD-10-CM | POA: Diagnosis not present

## 2019-10-04 DIAGNOSIS — G629 Polyneuropathy, unspecified: Secondary | ICD-10-CM | POA: Diagnosis not present

## 2019-10-04 DIAGNOSIS — I10 Essential (primary) hypertension: Secondary | ICD-10-CM | POA: Diagnosis not present

## 2019-10-04 DIAGNOSIS — R338 Other retention of urine: Secondary | ICD-10-CM | POA: Diagnosis not present

## 2019-10-04 DIAGNOSIS — Z79891 Long term (current) use of opiate analgesic: Secondary | ICD-10-CM | POA: Diagnosis not present

## 2019-10-04 DIAGNOSIS — Z87440 Personal history of urinary (tract) infections: Secondary | ICD-10-CM | POA: Diagnosis not present

## 2019-10-04 DIAGNOSIS — G4730 Sleep apnea, unspecified: Secondary | ICD-10-CM | POA: Diagnosis not present

## 2019-10-04 DIAGNOSIS — M1991 Primary osteoarthritis, unspecified site: Secondary | ICD-10-CM | POA: Diagnosis not present

## 2019-10-04 DIAGNOSIS — Z9181 History of falling: Secondary | ICD-10-CM | POA: Diagnosis not present

## 2019-10-04 DIAGNOSIS — M21371 Foot drop, right foot: Secondary | ICD-10-CM | POA: Diagnosis not present

## 2019-10-04 DIAGNOSIS — E785 Hyperlipidemia, unspecified: Secondary | ICD-10-CM | POA: Diagnosis not present

## 2019-10-04 DIAGNOSIS — G2581 Restless legs syndrome: Secondary | ICD-10-CM | POA: Diagnosis not present

## 2019-10-04 DIAGNOSIS — G894 Chronic pain syndrome: Secondary | ICD-10-CM | POA: Diagnosis not present

## 2019-10-05 DIAGNOSIS — R3915 Urgency of urination: Secondary | ICD-10-CM | POA: Diagnosis not present

## 2019-10-05 DIAGNOSIS — R350 Frequency of micturition: Secondary | ICD-10-CM | POA: Diagnosis not present

## 2019-10-10 DIAGNOSIS — I10 Essential (primary) hypertension: Secondary | ICD-10-CM | POA: Diagnosis not present

## 2019-10-10 DIAGNOSIS — G894 Chronic pain syndrome: Secondary | ICD-10-CM | POA: Diagnosis not present

## 2019-10-11 ENCOUNTER — Other Ambulatory Visit: Payer: Self-pay | Admitting: Adult Health

## 2019-10-11 DIAGNOSIS — F4310 Post-traumatic stress disorder, unspecified: Secondary | ICD-10-CM

## 2019-10-11 DIAGNOSIS — F331 Major depressive disorder, recurrent, moderate: Secondary | ICD-10-CM

## 2019-10-11 DIAGNOSIS — G4700 Insomnia, unspecified: Secondary | ICD-10-CM

## 2019-10-11 DIAGNOSIS — F411 Generalized anxiety disorder: Secondary | ICD-10-CM

## 2019-10-13 DIAGNOSIS — G894 Chronic pain syndrome: Secondary | ICD-10-CM | POA: Diagnosis not present

## 2019-10-13 DIAGNOSIS — G629 Polyneuropathy, unspecified: Secondary | ICD-10-CM | POA: Diagnosis not present

## 2019-10-13 DIAGNOSIS — M1991 Primary osteoarthritis, unspecified site: Secondary | ICD-10-CM | POA: Diagnosis not present

## 2019-10-13 DIAGNOSIS — G4730 Sleep apnea, unspecified: Secondary | ICD-10-CM | POA: Diagnosis not present

## 2019-10-13 DIAGNOSIS — E785 Hyperlipidemia, unspecified: Secondary | ICD-10-CM | POA: Diagnosis not present

## 2019-10-13 DIAGNOSIS — R338 Other retention of urine: Secondary | ICD-10-CM | POA: Diagnosis not present

## 2019-10-13 DIAGNOSIS — M21371 Foot drop, right foot: Secondary | ICD-10-CM | POA: Diagnosis not present

## 2019-10-13 DIAGNOSIS — Z79891 Long term (current) use of opiate analgesic: Secondary | ICD-10-CM | POA: Diagnosis not present

## 2019-10-13 DIAGNOSIS — Z87440 Personal history of urinary (tract) infections: Secondary | ICD-10-CM | POA: Diagnosis not present

## 2019-10-13 DIAGNOSIS — R202 Paresthesia of skin: Secondary | ICD-10-CM | POA: Diagnosis not present

## 2019-10-13 DIAGNOSIS — I10 Essential (primary) hypertension: Secondary | ICD-10-CM | POA: Diagnosis not present

## 2019-10-13 DIAGNOSIS — Z9181 History of falling: Secondary | ICD-10-CM | POA: Diagnosis not present

## 2019-10-13 DIAGNOSIS — G2581 Restless legs syndrome: Secondary | ICD-10-CM | POA: Diagnosis not present

## 2019-10-17 ENCOUNTER — Encounter: Payer: Self-pay | Admitting: Adult Health

## 2019-10-17 ENCOUNTER — Ambulatory Visit (INDEPENDENT_AMBULATORY_CARE_PROVIDER_SITE_OTHER): Payer: Medicare Other | Admitting: Adult Health

## 2019-10-17 ENCOUNTER — Other Ambulatory Visit: Payer: Self-pay

## 2019-10-17 DIAGNOSIS — F4310 Post-traumatic stress disorder, unspecified: Secondary | ICD-10-CM | POA: Diagnosis not present

## 2019-10-17 DIAGNOSIS — F411 Generalized anxiety disorder: Secondary | ICD-10-CM

## 2019-10-17 DIAGNOSIS — G4700 Insomnia, unspecified: Secondary | ICD-10-CM | POA: Diagnosis not present

## 2019-10-17 DIAGNOSIS — F331 Major depressive disorder, recurrent, moderate: Secondary | ICD-10-CM

## 2019-10-17 MED ORDER — ARIPIPRAZOLE 5 MG PO TABS: 5.0000 mg | ORAL_TABLET | Freq: Every day | ORAL | 2 refills | Status: DC

## 2019-10-17 NOTE — Progress Notes (Signed)
Luke Foster [id number] 13-Jun-1950 69 y.o.  Subjective:   Patient ID:  Luke Foster is a 69 y.o. (DOB [birth year]) male.  Chief Complaint: No chief complaint on file.   HPI JOHNATHYN VISCOMI presents to the office today for follow-up of MDD, GAD, PTSD, and insomnia.  Describes mood today as "better". Pleasant. Tearful - "not as much". Mood symptoms - reports decreased depression and anxiety. Denies irritability. Stating "I fee like I'm not cycling through the things that bring me down as much". Feels like addition of Abilify has been helpful. Denies any falls. Varying interest and motivation. Taking medications as prescribed.  Energy levels low. Active, does not have a regular exercise routine with current physical disabilities.  Enjoys some usual interests and activities. Divorced. Lives alone with 36 year old dog. Has a brother and 2 sisters. Brother driving him to appointments. Spending time with family. Appetite adequate. Weight stable - 183 pounds.  Sleeps better some nights than others. Averages 3 to 4 hours. Denies daytime napping. Trying to sleep in his bed again - has a new "modern" bed. Typically sleeps in a recliner.  Focus and concentration stable. Completing tasks. Managing aspects of household. Disabled since 2007. Chronic pain. Back and neck injuries - childhood and adulthood.   Denies SI or HI. Denies AH or VH.  Previous medication trials: Effexor XR 225mg  daily, Risperdal - not helpful   Review of Systems:  Review of Systems  Musculoskeletal: Negative for gait problem.  Neurological: Negative for tremors.  Psychiatric/Behavioral:       Please refer to HPI    Medications: I have reviewed the patient's current medications.  Current Outpatient Medications  Medication Sig Dispense Refill  . ARIPiprazole (ABILIFY) 5 MG tablet Take 1 tablet (5 mg total) by mouth daily. 30 tablet 2  . cyclobenzaprine (FLEXERIL) 10 MG tablet Take 10 mg by mouth 2 (two) times daily as needed  for muscle spasms.     . diazepam (VALIUM) 10 MG tablet Take 10 mg by mouth 2 (two) times daily as needed. For restless leg    . diltiazem (CARDIZEM CD) 120 MG 24 hr capsule Take 120 mg by mouth every evening.     . enalapril (VASOTEC) 5 MG tablet Take 5 mg by mouth 2 (two) times daily.     Marland Kitchen lidocaine (LIDODERM) 5 % Place 1 patch onto the skin daily as needed (pain.). Remove & Discard patch within 12 hours or as directed by MD     . mirabegron ER (MYRBETRIQ) 50 MG TB24 tablet Take by mouth.    . oxybutynin (DITROPAN) 5 MG tablet Take 1 tablet (5 mg total) by mouth 3 (three) times daily. X 5 days (Patient not taking: Reported on 05/09/2019) 15 tablet 0  . Oxycodone HCl 10 MG TABS Take 1 tablet (10 mg total) by mouth every 6 (six) hours as needed for moderate pain or severe pain. 12 tablet 0  . tamsulosin (FLOMAX) 0.4 MG CAPS capsule Take 0.4 mg by mouth every evening.     . venlafaxine XR (EFFEXOR-XR) 75 MG 24 hr capsule Take 75 mg by mouth 3 (three) times daily.     . vitamin B-12 (CYANOCOBALAMIN) 500 MCG tablet Take 1 tablet (500 mcg total) by mouth daily.     No current facility-administered medications for this visit.    Medication Side Effects: None  Allergies:  Allergies  Allergen Reactions  . Erythromycin Itching    Past Medical History:  Diagnosis Date  .  Arthritis   . Chronic back pain   . Chronic pain 1999   Bilateral feet (R >L)  . Difficult or painful urination   . High blood pressure   . History of kidney stones    H/O  . Nerve damage   . Pain management   . Paresthesia of foot, bilateral   . Sleep apnea    USES CPAP  . Weakness of both legs     Family History  Problem Relation Age of Onset  . Heart disease Father        Living, 22  . Breast cancer Mother        Living, 60  . Hypercholesterolemia Mother   . Healthy Brother   . Healthy Sister     Social History   Socioeconomic History  . Marital status: Single    Spouse name: Not on file  . Number  of children: 0  . Years of education: Not on file  . Highest education level: High school graduate  Occupational History  . Not on file  Tobacco Use  . Smoking status: Former Smoker    Packs/day: 2.00    Years: 10.00    Pack years: 20.00    Types: Cigarettes    Quit date: 05/21/1991    Years since quitting: 28.4  . Smokeless tobacco: Former Systems developer    Quit date: 06/08/1991  Vaping Use  . Vaping Use: Never used  Substance and Sexual Activity  . Alcohol use: No  . Drug use: No  . Sexual activity: Not on file  Other Topics Concern  . Not on file  Social History Narrative   Lives with wife.   He is on disability since 2011 due to chronic back pain.      Caffeine - one mountain dew in the morning    Social Determinants of Health   Financial Resource Strain:   . Difficulty of Paying Living Expenses: Not on file  Food Insecurity:   . Worried About Charity fundraiser in the Last Year: Not on file  . Ran Out of Food in the Last Year: Not on file  Transportation Needs:   . Lack of Transportation (Medical): Not on file  . Lack of Transportation (Non-Medical): Not on file  Physical Activity:   . Days of Exercise per Week: Not on file  . Minutes of Exercise per Session: Not on file  Stress:   . Feeling of Stress : Not on file  Social Connections:   . Frequency of Communication with Friends and Family: Not on file  . Frequency of Social Gatherings with Friends and Family: Not on file  . Attends Religious Services: Not on file  . Active Member of Clubs or Organizations: Not on file  . Attends Archivist Meetings: Not on file  . Marital Status: Not on file  Intimate Partner Violence:   . Fear of Current or Ex-Partner: Not on file  . Emotionally Abused: Not on file  . Physically Abused: Not on file  . Sexually Abused: Not on file    Past Medical History, Surgical history, Social history, and Family history were reviewed and updated as appropriate.   Please see review  of systems for further details on the patient's review from today.   Objective:   Physical Exam:  There were no vitals taken for this visit.  Physical Exam Constitutional:      General: He is not in acute distress. Musculoskeletal:  General: No deformity.  Neurological:     Mental Status: He is alert and oriented to person, place, and time.     Coordination: Coordination normal.  Psychiatric:        Attention and Perception: Attention and perception normal. He does not perceive auditory or visual hallucinations.        Mood and Affect: Mood normal. Mood is not anxious or depressed. Affect is not labile, blunt, angry or inappropriate.        Speech: Speech normal.        Behavior: Behavior normal.        Thought Content: Thought content normal. Thought content is not paranoid or delusional. Thought content does not include homicidal or suicidal ideation. Thought content does not include homicidal or suicidal plan.        Cognition and Memory: Cognition and memory normal.        Judgment: Judgment normal.     Comments: Insight intact     Lab Review:     Component Value Date/Time   NA 137 04/26/2019 1721   K 3.7 04/26/2019 1721   CL 104 04/26/2019 1721   CO2 26 04/26/2019 1721   GLUCOSE 91 04/26/2019 1721   BUN 18 04/26/2019 1721   CREATININE 1.17 04/26/2019 1721   CALCIUM 9.2 04/26/2019 1721   PROT 7.3 04/26/2019 1721   ALBUMIN 3.6 04/26/2019 1721   AST 17 04/26/2019 1721   ALT 14 04/26/2019 1721   ALKPHOS 121 04/26/2019 1721   BILITOT 0.4 04/26/2019 1721   GFRNONAA >60 04/26/2019 1721   GFRAA >60 04/26/2019 1721       Component Value Date/Time   WBC 8.8 04/26/2019 1721   RBC 4.59 04/26/2019 1721   HGB 13.4 04/26/2019 1721   HCT 42.1 04/26/2019 1721   PLT 231 04/26/2019 1721   MCV 91.7 04/26/2019 1721   MCH 29.2 04/26/2019 1721   MCHC 31.8 04/26/2019 1721   RDW 12.8 04/26/2019 1721   LYMPHSABS 1.1 04/26/2019 1721   MONOABS 0.7 04/26/2019 1721   EOSABS  0.1 04/26/2019 1721   BASOSABS 0.1 04/26/2019 1721    No results found for: POCLITH, LITHIUM   No results found for: PHENYTOIN, PHENOBARB, VALPROATE, CBMZ   .res Assessment: Plan:    Plan:  PDMP reviewed  1. Continue Effexor 225mg  daily 2. Continue Abilify 5mg  daily  RTC 8 weeks  Patient advised to contact office with any questions, adverse effects, or acute worsening in signs and symptoms.  Discussed potential metabolic side effects associated with atypical antipsychotics, as well as potential risk for movement side effects. Advised pt to contact office if movement side effects occur.    Diagnoses and all orders for this visit:  PTSD (post-traumatic stress disorder) -     ARIPiprazole (ABILIFY) 5 MG tablet; Take 1 tablet (5 mg total) by mouth daily.  Major depressive disorder, recurrent episode, moderate (HCC) -     ARIPiprazole (ABILIFY) 5 MG tablet; Take 1 tablet (5 mg total) by mouth daily.  Generalized anxiety disorder -     ARIPiprazole (ABILIFY) 5 MG tablet; Take 1 tablet (5 mg total) by mouth daily.  Insomnia, unspecified type -     ARIPiprazole (ABILIFY) 5 MG tablet; Take 1 tablet (5 mg total) by mouth daily.     Please see After Visit Summary for patient specific instructions.  No future appointments.  No orders of the defined types were placed in this encounter.   -------------------------------

## 2019-10-18 DIAGNOSIS — R338 Other retention of urine: Secondary | ICD-10-CM | POA: Diagnosis not present

## 2019-10-18 DIAGNOSIS — Z79891 Long term (current) use of opiate analgesic: Secondary | ICD-10-CM | POA: Diagnosis not present

## 2019-10-18 DIAGNOSIS — Z87440 Personal history of urinary (tract) infections: Secondary | ICD-10-CM | POA: Diagnosis not present

## 2019-10-18 DIAGNOSIS — R202 Paresthesia of skin: Secondary | ICD-10-CM | POA: Diagnosis not present

## 2019-10-18 DIAGNOSIS — I10 Essential (primary) hypertension: Secondary | ICD-10-CM | POA: Diagnosis not present

## 2019-10-18 DIAGNOSIS — G2581 Restless legs syndrome: Secondary | ICD-10-CM | POA: Diagnosis not present

## 2019-10-18 DIAGNOSIS — Z9181 History of falling: Secondary | ICD-10-CM | POA: Diagnosis not present

## 2019-10-18 DIAGNOSIS — G4730 Sleep apnea, unspecified: Secondary | ICD-10-CM | POA: Diagnosis not present

## 2019-10-18 DIAGNOSIS — G894 Chronic pain syndrome: Secondary | ICD-10-CM | POA: Diagnosis not present

## 2019-10-18 DIAGNOSIS — E785 Hyperlipidemia, unspecified: Secondary | ICD-10-CM | POA: Diagnosis not present

## 2019-10-18 DIAGNOSIS — M1991 Primary osteoarthritis, unspecified site: Secondary | ICD-10-CM | POA: Diagnosis not present

## 2019-10-18 DIAGNOSIS — G629 Polyneuropathy, unspecified: Secondary | ICD-10-CM | POA: Diagnosis not present

## 2019-10-18 DIAGNOSIS — M21371 Foot drop, right foot: Secondary | ICD-10-CM | POA: Diagnosis not present

## 2019-10-25 DIAGNOSIS — G894 Chronic pain syndrome: Secondary | ICD-10-CM | POA: Diagnosis not present

## 2019-10-25 DIAGNOSIS — G629 Polyneuropathy, unspecified: Secondary | ICD-10-CM | POA: Diagnosis not present

## 2019-10-25 DIAGNOSIS — R202 Paresthesia of skin: Secondary | ICD-10-CM | POA: Diagnosis not present

## 2019-10-25 DIAGNOSIS — G4730 Sleep apnea, unspecified: Secondary | ICD-10-CM | POA: Diagnosis not present

## 2019-10-25 DIAGNOSIS — R338 Other retention of urine: Secondary | ICD-10-CM | POA: Diagnosis not present

## 2019-10-25 DIAGNOSIS — I10 Essential (primary) hypertension: Secondary | ICD-10-CM | POA: Diagnosis not present

## 2019-10-25 DIAGNOSIS — E785 Hyperlipidemia, unspecified: Secondary | ICD-10-CM | POA: Diagnosis not present

## 2019-10-25 DIAGNOSIS — M21371 Foot drop, right foot: Secondary | ICD-10-CM | POA: Diagnosis not present

## 2019-10-25 DIAGNOSIS — Z9181 History of falling: Secondary | ICD-10-CM | POA: Diagnosis not present

## 2019-10-25 DIAGNOSIS — Z87440 Personal history of urinary (tract) infections: Secondary | ICD-10-CM | POA: Diagnosis not present

## 2019-10-25 DIAGNOSIS — G2581 Restless legs syndrome: Secondary | ICD-10-CM | POA: Diagnosis not present

## 2019-10-25 DIAGNOSIS — Z79891 Long term (current) use of opiate analgesic: Secondary | ICD-10-CM | POA: Diagnosis not present

## 2019-10-25 DIAGNOSIS — M1991 Primary osteoarthritis, unspecified site: Secondary | ICD-10-CM | POA: Diagnosis not present

## 2019-10-31 DIAGNOSIS — E785 Hyperlipidemia, unspecified: Secondary | ICD-10-CM | POA: Diagnosis not present

## 2019-10-31 DIAGNOSIS — G629 Polyneuropathy, unspecified: Secondary | ICD-10-CM | POA: Diagnosis not present

## 2019-10-31 DIAGNOSIS — M5136 Other intervertebral disc degeneration, lumbar region: Secondary | ICD-10-CM | POA: Diagnosis not present

## 2019-10-31 DIAGNOSIS — I10 Essential (primary) hypertension: Secondary | ICD-10-CM | POA: Diagnosis not present

## 2019-10-31 DIAGNOSIS — G894 Chronic pain syndrome: Secondary | ICD-10-CM | POA: Diagnosis not present

## 2019-11-09 DIAGNOSIS — Z79891 Long term (current) use of opiate analgesic: Secondary | ICD-10-CM | POA: Diagnosis not present

## 2019-11-09 DIAGNOSIS — Z9181 History of falling: Secondary | ICD-10-CM | POA: Diagnosis not present

## 2019-11-09 DIAGNOSIS — E785 Hyperlipidemia, unspecified: Secondary | ICD-10-CM | POA: Diagnosis not present

## 2019-11-09 DIAGNOSIS — G629 Polyneuropathy, unspecified: Secondary | ICD-10-CM | POA: Diagnosis not present

## 2019-11-09 DIAGNOSIS — R202 Paresthesia of skin: Secondary | ICD-10-CM | POA: Diagnosis not present

## 2019-11-09 DIAGNOSIS — M21371 Foot drop, right foot: Secondary | ICD-10-CM | POA: Diagnosis not present

## 2019-11-09 DIAGNOSIS — M1991 Primary osteoarthritis, unspecified site: Secondary | ICD-10-CM | POA: Diagnosis not present

## 2019-11-09 DIAGNOSIS — G4730 Sleep apnea, unspecified: Secondary | ICD-10-CM | POA: Diagnosis not present

## 2019-11-09 DIAGNOSIS — G2581 Restless legs syndrome: Secondary | ICD-10-CM | POA: Diagnosis not present

## 2019-11-09 DIAGNOSIS — R338 Other retention of urine: Secondary | ICD-10-CM | POA: Diagnosis not present

## 2019-11-09 DIAGNOSIS — G894 Chronic pain syndrome: Secondary | ICD-10-CM | POA: Diagnosis not present

## 2019-11-09 DIAGNOSIS — I10 Essential (primary) hypertension: Secondary | ICD-10-CM | POA: Diagnosis not present

## 2019-11-09 DIAGNOSIS — Z87440 Personal history of urinary (tract) infections: Secondary | ICD-10-CM | POA: Diagnosis not present

## 2019-11-14 DIAGNOSIS — Z79891 Long term (current) use of opiate analgesic: Secondary | ICD-10-CM | POA: Diagnosis not present

## 2019-11-14 DIAGNOSIS — Z9181 History of falling: Secondary | ICD-10-CM | POA: Diagnosis not present

## 2019-11-14 DIAGNOSIS — G2581 Restless legs syndrome: Secondary | ICD-10-CM | POA: Diagnosis not present

## 2019-11-14 DIAGNOSIS — E785 Hyperlipidemia, unspecified: Secondary | ICD-10-CM | POA: Diagnosis not present

## 2019-11-14 DIAGNOSIS — R338 Other retention of urine: Secondary | ICD-10-CM | POA: Diagnosis not present

## 2019-11-14 DIAGNOSIS — Z87440 Personal history of urinary (tract) infections: Secondary | ICD-10-CM | POA: Diagnosis not present

## 2019-11-14 DIAGNOSIS — R202 Paresthesia of skin: Secondary | ICD-10-CM | POA: Diagnosis not present

## 2019-11-14 DIAGNOSIS — G629 Polyneuropathy, unspecified: Secondary | ICD-10-CM | POA: Diagnosis not present

## 2019-11-14 DIAGNOSIS — G894 Chronic pain syndrome: Secondary | ICD-10-CM | POA: Diagnosis not present

## 2019-11-14 DIAGNOSIS — I10 Essential (primary) hypertension: Secondary | ICD-10-CM | POA: Diagnosis not present

## 2019-11-14 DIAGNOSIS — G4730 Sleep apnea, unspecified: Secondary | ICD-10-CM | POA: Diagnosis not present

## 2019-11-14 DIAGNOSIS — M1991 Primary osteoarthritis, unspecified site: Secondary | ICD-10-CM | POA: Diagnosis not present

## 2019-11-14 DIAGNOSIS — M21371 Foot drop, right foot: Secondary | ICD-10-CM | POA: Diagnosis not present

## 2019-11-16 DIAGNOSIS — E785 Hyperlipidemia, unspecified: Secondary | ICD-10-CM | POA: Diagnosis not present

## 2019-11-16 DIAGNOSIS — G2581 Restless legs syndrome: Secondary | ICD-10-CM | POA: Diagnosis not present

## 2019-11-16 DIAGNOSIS — Z9181 History of falling: Secondary | ICD-10-CM | POA: Diagnosis not present

## 2019-11-16 DIAGNOSIS — R202 Paresthesia of skin: Secondary | ICD-10-CM | POA: Diagnosis not present

## 2019-11-16 DIAGNOSIS — G629 Polyneuropathy, unspecified: Secondary | ICD-10-CM | POA: Diagnosis not present

## 2019-11-16 DIAGNOSIS — M1991 Primary osteoarthritis, unspecified site: Secondary | ICD-10-CM | POA: Diagnosis not present

## 2019-11-16 DIAGNOSIS — Z87440 Personal history of urinary (tract) infections: Secondary | ICD-10-CM | POA: Diagnosis not present

## 2019-11-16 DIAGNOSIS — R338 Other retention of urine: Secondary | ICD-10-CM | POA: Diagnosis not present

## 2019-11-16 DIAGNOSIS — I10 Essential (primary) hypertension: Secondary | ICD-10-CM | POA: Diagnosis not present

## 2019-11-16 DIAGNOSIS — G4730 Sleep apnea, unspecified: Secondary | ICD-10-CM | POA: Diagnosis not present

## 2019-11-16 DIAGNOSIS — Z79891 Long term (current) use of opiate analgesic: Secondary | ICD-10-CM | POA: Diagnosis not present

## 2019-11-16 DIAGNOSIS — M21371 Foot drop, right foot: Secondary | ICD-10-CM | POA: Diagnosis not present

## 2019-11-16 DIAGNOSIS — G894 Chronic pain syndrome: Secondary | ICD-10-CM | POA: Diagnosis not present

## 2019-11-21 DIAGNOSIS — G629 Polyneuropathy, unspecified: Secondary | ICD-10-CM | POA: Diagnosis not present

## 2019-11-21 DIAGNOSIS — Z87440 Personal history of urinary (tract) infections: Secondary | ICD-10-CM | POA: Diagnosis not present

## 2019-11-21 DIAGNOSIS — Z79891 Long term (current) use of opiate analgesic: Secondary | ICD-10-CM | POA: Diagnosis not present

## 2019-11-21 DIAGNOSIS — G2581 Restless legs syndrome: Secondary | ICD-10-CM | POA: Diagnosis not present

## 2019-11-21 DIAGNOSIS — G894 Chronic pain syndrome: Secondary | ICD-10-CM | POA: Diagnosis not present

## 2019-11-21 DIAGNOSIS — I10 Essential (primary) hypertension: Secondary | ICD-10-CM | POA: Diagnosis not present

## 2019-11-21 DIAGNOSIS — R202 Paresthesia of skin: Secondary | ICD-10-CM | POA: Diagnosis not present

## 2019-11-21 DIAGNOSIS — Z9181 History of falling: Secondary | ICD-10-CM | POA: Diagnosis not present

## 2019-11-21 DIAGNOSIS — E785 Hyperlipidemia, unspecified: Secondary | ICD-10-CM | POA: Diagnosis not present

## 2019-11-21 DIAGNOSIS — M21371 Foot drop, right foot: Secondary | ICD-10-CM | POA: Diagnosis not present

## 2019-11-21 DIAGNOSIS — M1991 Primary osteoarthritis, unspecified site: Secondary | ICD-10-CM | POA: Diagnosis not present

## 2019-11-21 DIAGNOSIS — R338 Other retention of urine: Secondary | ICD-10-CM | POA: Diagnosis not present

## 2019-11-21 DIAGNOSIS — G4730 Sleep apnea, unspecified: Secondary | ICD-10-CM | POA: Diagnosis not present

## 2019-11-23 DIAGNOSIS — M21371 Foot drop, right foot: Secondary | ICD-10-CM | POA: Diagnosis not present

## 2019-11-23 DIAGNOSIS — I10 Essential (primary) hypertension: Secondary | ICD-10-CM | POA: Diagnosis not present

## 2019-11-23 DIAGNOSIS — G894 Chronic pain syndrome: Secondary | ICD-10-CM | POA: Diagnosis not present

## 2019-11-23 DIAGNOSIS — R202 Paresthesia of skin: Secondary | ICD-10-CM | POA: Diagnosis not present

## 2019-11-23 DIAGNOSIS — E785 Hyperlipidemia, unspecified: Secondary | ICD-10-CM | POA: Diagnosis not present

## 2019-11-23 DIAGNOSIS — Z9181 History of falling: Secondary | ICD-10-CM | POA: Diagnosis not present

## 2019-11-23 DIAGNOSIS — Z79891 Long term (current) use of opiate analgesic: Secondary | ICD-10-CM | POA: Diagnosis not present

## 2019-11-23 DIAGNOSIS — G4730 Sleep apnea, unspecified: Secondary | ICD-10-CM | POA: Diagnosis not present

## 2019-11-23 DIAGNOSIS — M1991 Primary osteoarthritis, unspecified site: Secondary | ICD-10-CM | POA: Diagnosis not present

## 2019-11-23 DIAGNOSIS — R338 Other retention of urine: Secondary | ICD-10-CM | POA: Diagnosis not present

## 2019-11-23 DIAGNOSIS — G629 Polyneuropathy, unspecified: Secondary | ICD-10-CM | POA: Diagnosis not present

## 2019-11-23 DIAGNOSIS — G2581 Restless legs syndrome: Secondary | ICD-10-CM | POA: Diagnosis not present

## 2019-11-23 DIAGNOSIS — Z87440 Personal history of urinary (tract) infections: Secondary | ICD-10-CM | POA: Diagnosis not present

## 2019-11-27 DIAGNOSIS — R5383 Other fatigue: Secondary | ICD-10-CM | POA: Diagnosis not present

## 2019-11-27 DIAGNOSIS — M810 Age-related osteoporosis without current pathological fracture: Secondary | ICD-10-CM | POA: Diagnosis not present

## 2019-11-27 DIAGNOSIS — E559 Vitamin D deficiency, unspecified: Secondary | ICD-10-CM | POA: Diagnosis not present

## 2019-11-29 DIAGNOSIS — G4730 Sleep apnea, unspecified: Secondary | ICD-10-CM | POA: Diagnosis not present

## 2019-11-29 DIAGNOSIS — G629 Polyneuropathy, unspecified: Secondary | ICD-10-CM | POA: Diagnosis not present

## 2019-11-29 DIAGNOSIS — Z79891 Long term (current) use of opiate analgesic: Secondary | ICD-10-CM | POA: Diagnosis not present

## 2019-11-29 DIAGNOSIS — R202 Paresthesia of skin: Secondary | ICD-10-CM | POA: Diagnosis not present

## 2019-11-29 DIAGNOSIS — I10 Essential (primary) hypertension: Secondary | ICD-10-CM | POA: Diagnosis not present

## 2019-11-29 DIAGNOSIS — M1991 Primary osteoarthritis, unspecified site: Secondary | ICD-10-CM | POA: Diagnosis not present

## 2019-11-29 DIAGNOSIS — Z9181 History of falling: Secondary | ICD-10-CM | POA: Diagnosis not present

## 2019-11-29 DIAGNOSIS — E785 Hyperlipidemia, unspecified: Secondary | ICD-10-CM | POA: Diagnosis not present

## 2019-11-29 DIAGNOSIS — R338 Other retention of urine: Secondary | ICD-10-CM | POA: Diagnosis not present

## 2019-11-29 DIAGNOSIS — Z87440 Personal history of urinary (tract) infections: Secondary | ICD-10-CM | POA: Diagnosis not present

## 2019-11-29 DIAGNOSIS — M21371 Foot drop, right foot: Secondary | ICD-10-CM | POA: Diagnosis not present

## 2019-11-29 DIAGNOSIS — G894 Chronic pain syndrome: Secondary | ICD-10-CM | POA: Diagnosis not present

## 2019-11-29 DIAGNOSIS — G2581 Restless legs syndrome: Secondary | ICD-10-CM | POA: Diagnosis not present

## 2019-12-06 DIAGNOSIS — E785 Hyperlipidemia, unspecified: Secondary | ICD-10-CM | POA: Diagnosis not present

## 2019-12-06 DIAGNOSIS — G2581 Restless legs syndrome: Secondary | ICD-10-CM | POA: Diagnosis not present

## 2019-12-06 DIAGNOSIS — M1991 Primary osteoarthritis, unspecified site: Secondary | ICD-10-CM | POA: Diagnosis not present

## 2019-12-06 DIAGNOSIS — Z79891 Long term (current) use of opiate analgesic: Secondary | ICD-10-CM | POA: Diagnosis not present

## 2019-12-06 DIAGNOSIS — Z87440 Personal history of urinary (tract) infections: Secondary | ICD-10-CM | POA: Diagnosis not present

## 2019-12-06 DIAGNOSIS — R338 Other retention of urine: Secondary | ICD-10-CM | POA: Diagnosis not present

## 2019-12-06 DIAGNOSIS — G629 Polyneuropathy, unspecified: Secondary | ICD-10-CM | POA: Diagnosis not present

## 2019-12-06 DIAGNOSIS — R202 Paresthesia of skin: Secondary | ICD-10-CM | POA: Diagnosis not present

## 2019-12-06 DIAGNOSIS — G894 Chronic pain syndrome: Secondary | ICD-10-CM | POA: Diagnosis not present

## 2019-12-06 DIAGNOSIS — Z9181 History of falling: Secondary | ICD-10-CM | POA: Diagnosis not present

## 2019-12-06 DIAGNOSIS — M21371 Foot drop, right foot: Secondary | ICD-10-CM | POA: Diagnosis not present

## 2019-12-06 DIAGNOSIS — G4730 Sleep apnea, unspecified: Secondary | ICD-10-CM | POA: Diagnosis not present

## 2019-12-06 DIAGNOSIS — I10 Essential (primary) hypertension: Secondary | ICD-10-CM | POA: Diagnosis not present

## 2019-12-09 DIAGNOSIS — G894 Chronic pain syndrome: Secondary | ICD-10-CM | POA: Diagnosis not present

## 2019-12-09 DIAGNOSIS — I10 Essential (primary) hypertension: Secondary | ICD-10-CM | POA: Diagnosis not present

## 2019-12-13 DIAGNOSIS — Z9181 History of falling: Secondary | ICD-10-CM | POA: Diagnosis not present

## 2019-12-13 DIAGNOSIS — M21371 Foot drop, right foot: Secondary | ICD-10-CM | POA: Diagnosis not present

## 2019-12-13 DIAGNOSIS — I10 Essential (primary) hypertension: Secondary | ICD-10-CM | POA: Diagnosis not present

## 2019-12-13 DIAGNOSIS — G629 Polyneuropathy, unspecified: Secondary | ICD-10-CM | POA: Diagnosis not present

## 2019-12-13 DIAGNOSIS — Z79891 Long term (current) use of opiate analgesic: Secondary | ICD-10-CM | POA: Diagnosis not present

## 2019-12-13 DIAGNOSIS — R202 Paresthesia of skin: Secondary | ICD-10-CM | POA: Diagnosis not present

## 2019-12-13 DIAGNOSIS — G4730 Sleep apnea, unspecified: Secondary | ICD-10-CM | POA: Diagnosis not present

## 2019-12-13 DIAGNOSIS — G894 Chronic pain syndrome: Secondary | ICD-10-CM | POA: Diagnosis not present

## 2019-12-13 DIAGNOSIS — R338 Other retention of urine: Secondary | ICD-10-CM | POA: Diagnosis not present

## 2019-12-13 DIAGNOSIS — E785 Hyperlipidemia, unspecified: Secondary | ICD-10-CM | POA: Diagnosis not present

## 2019-12-13 DIAGNOSIS — Z87440 Personal history of urinary (tract) infections: Secondary | ICD-10-CM | POA: Diagnosis not present

## 2019-12-13 DIAGNOSIS — M1991 Primary osteoarthritis, unspecified site: Secondary | ICD-10-CM | POA: Diagnosis not present

## 2019-12-13 DIAGNOSIS — G2581 Restless legs syndrome: Secondary | ICD-10-CM | POA: Diagnosis not present

## 2019-12-18 ENCOUNTER — Other Ambulatory Visit: Payer: Self-pay

## 2019-12-18 ENCOUNTER — Encounter: Payer: Self-pay | Admitting: Adult Health

## 2019-12-18 ENCOUNTER — Ambulatory Visit (INDEPENDENT_AMBULATORY_CARE_PROVIDER_SITE_OTHER): Payer: Medicare Other | Admitting: Adult Health

## 2019-12-18 DIAGNOSIS — G4700 Insomnia, unspecified: Secondary | ICD-10-CM | POA: Diagnosis not present

## 2019-12-18 DIAGNOSIS — F4310 Post-traumatic stress disorder, unspecified: Secondary | ICD-10-CM | POA: Diagnosis not present

## 2019-12-18 DIAGNOSIS — F411 Generalized anxiety disorder: Secondary | ICD-10-CM

## 2019-12-18 DIAGNOSIS — F331 Major depressive disorder, recurrent, moderate: Secondary | ICD-10-CM

## 2019-12-18 MED ORDER — ARIPIPRAZOLE 5 MG PO TABS: 5.0000 mg | ORAL_TABLET | Freq: Every day | ORAL | 5 refills | Status: DC

## 2019-12-18 NOTE — Progress Notes (Signed)
Luke Foster [id number] 1950/04/30 69 y.o.  Subjective:   Patient ID:  Luke Foster is a 69 y.o. (DOB [birth year]) male.  Chief Complaint: No chief complaint on file.   HPI Luke Foster presents to the office today for follow-up of MDD, GAD, PTSD, and insomnia.  Describes mood today as "ok. Pleasant. Tearful - "at times, not near as much". Stating "I'm dealing with the fact that I can't do things a little easier than I was". Mood symptoms - reports decreased depression and anxiety. Denies irritability. Feels like Abilify continues to work well. Denies any recent falls. Varying interest and motivation. Taking medications as prescribed.  Energy levels low. Active, does not have a regular exercise routine with current physical disabilities.  Enjoys some usual interests and activities. Divorced. Lives alone with 41 year old dog. Has a brother and 2 sisters. Brother driving him to appointments. Spending time with family. Appetite adequate. Weight stable - 186 pounds.  Sleeps better some nights than others. Averages 4 hours. Denies daytime napping.  Focus and concentration stable. Completing tasks. Managing aspects of household. Disabled since 2007. Chronic pain. Back and neck injuries - childhood and adulthood.   Denies SI or HI. Denies AH or VH.  Previous medication trials: Effexor XR 225mg  daily, Risperdal - not helpful  Review of Systems:  Review of Systems  Musculoskeletal: Negative for gait problem.  Neurological: Negative for tremors.  Psychiatric/Behavioral:       Please refer to HPI    Medications: I have reviewed the patient's current medications.  Current Outpatient Medications  Medication Sig Dispense Refill  . ARIPiprazole (ABILIFY) 5 MG tablet Take 1 tablet (5 mg total) by mouth daily. 30 tablet 5  . cyclobenzaprine (FLEXERIL) 10 MG tablet Take 10 mg by mouth 2 (two) times daily as needed for muscle spasms.     . diazepam (VALIUM) 10 MG tablet Take 10 mg by mouth 2 (two)  times daily as needed. For restless leg    . diltiazem (CARDIZEM CD) 120 MG 24 hr capsule Take 120 mg by mouth every evening.     . enalapril (VASOTEC) 5 MG tablet Take 5 mg by mouth 2 (two) times daily.     Marland Kitchen lidocaine (LIDODERM) 5 % Place 1 patch onto the skin daily as needed (pain.). Remove & Discard patch within 12 hours or as directed by MD     . mirabegron ER (MYRBETRIQ) 50 MG TB24 tablet Take by mouth.    . oxybutynin (DITROPAN) 5 MG tablet Take 1 tablet (5 mg total) by mouth 3 (three) times daily. X 5 days (Patient not taking: Reported on 05/09/2019) 15 tablet 0  . Oxycodone HCl 10 MG TABS Take 1 tablet (10 mg total) by mouth every 6 (six) hours as needed for moderate pain or severe pain. 12 tablet 0  . tamsulosin (FLOMAX) 0.4 MG CAPS capsule Take 0.4 mg by mouth every evening.     . venlafaxine XR (EFFEXOR-XR) 75 MG 24 hr capsule Take 75 mg by mouth 3 (three) times daily.     . vitamin B-12 (CYANOCOBALAMIN) 500 MCG tablet Take 1 tablet (500 mcg total) by mouth daily.     No current facility-administered medications for this visit.    Medication Side Effects: None  Allergies:  Allergies  Allergen Reactions  . Erythromycin Itching    Past Medical History:  Diagnosis Date  . Arthritis   . Chronic back pain   . Chronic pain 1999   Bilateral  feet (R >L)  . Difficult or painful urination   . High blood pressure   . History of kidney stones    H/O  . Nerve damage   . Pain management   . Paresthesia of foot, bilateral   . Sleep apnea    USES CPAP  . Weakness of both legs     Family History  Problem Relation Age of Onset  . Heart disease Father        Living, 44  . Breast cancer Mother        Living, 75  . Hypercholesterolemia Mother   . Healthy Brother   . Healthy Sister     Social History   Socioeconomic History  . Marital status: Single    Spouse name: Not on file  . Number of children: 0  . Years of education: Not on file  . Highest education level: High  school graduate  Occupational History  . Not on file  Tobacco Use  . Smoking status: Former Smoker    Packs/day: 2.00    Years: 10.00    Pack years: 20.00    Types: Cigarettes    Quit date: 05/21/1991    Years since quitting: 28.5  . Smokeless tobacco: Former Systems developer    Quit date: 06/08/1991  Vaping Use  . Vaping Use: Never used  Substance and Sexual Activity  . Alcohol use: No  . Drug use: No  . Sexual activity: Not on file  Other Topics Concern  . Not on file  Social History Narrative   Lives with wife.   He is on disability since 2011 due to chronic back pain.      Caffeine - one mountain dew in the morning    Social Determinants of Health   Financial Resource Strain:   . Difficulty of Paying Living Expenses: Not on file  Food Insecurity:   . Worried About Charity fundraiser in the Last Year: Not on file  . Ran Out of Food in the Last Year: Not on file  Transportation Needs:   . Lack of Transportation (Medical): Not on file  . Lack of Transportation (Non-Medical): Not on file  Physical Activity:   . Days of Exercise per Week: Not on file  . Minutes of Exercise per Session: Not on file  Stress:   . Feeling of Stress : Not on file  Social Connections:   . Frequency of Communication with Friends and Family: Not on file  . Frequency of Social Gatherings with Friends and Family: Not on file  . Attends Religious Services: Not on file  . Active Member of Clubs or Organizations: Not on file  . Attends Archivist Meetings: Not on file  . Marital Status: Not on file  Intimate Partner Violence:   . Fear of Current or Ex-Partner: Not on file  . Emotionally Abused: Not on file  . Physically Abused: Not on file  . Sexually Abused: Not on file    Past Medical History, Surgical history, Social history, and Family history were reviewed and updated as appropriate.   Please see review of systems for further details on the patient's review from today.   Objective:    Physical Exam:  There were no vitals taken for this visit.  Physical Exam Constitutional:      General: He is not in acute distress. Musculoskeletal:        General: No deformity.  Neurological:     Mental Status: He is alert  and oriented to person, place, and time.     Coordination: Coordination normal.  Psychiatric:        Attention and Perception: Attention and perception normal. He does not perceive auditory or visual hallucinations.        Mood and Affect: Mood normal. Mood is not anxious or depressed. Affect is not labile, blunt, angry or inappropriate.        Speech: Speech normal.        Behavior: Behavior normal.        Thought Content: Thought content normal. Thought content is not paranoid or delusional. Thought content does not include homicidal or suicidal ideation. Thought content does not include homicidal or suicidal plan.        Cognition and Memory: Cognition and memory normal.        Judgment: Judgment normal.     Comments: Insight intact     Lab Review:     Component Value Date/Time   NA 137 04/26/2019 1721   K 3.7 04/26/2019 1721   CL 104 04/26/2019 1721   CO2 26 04/26/2019 1721   GLUCOSE 91 04/26/2019 1721   BUN 18 04/26/2019 1721   CREATININE 1.17 04/26/2019 1721   CALCIUM 9.2 04/26/2019 1721   PROT 7.3 04/26/2019 1721   ALBUMIN 3.6 04/26/2019 1721   AST 17 04/26/2019 1721   ALT 14 04/26/2019 1721   ALKPHOS 121 04/26/2019 1721   BILITOT 0.4 04/26/2019 1721   GFRNONAA >60 04/26/2019 1721   GFRAA >60 04/26/2019 1721       Component Value Date/Time   WBC 8.8 04/26/2019 1721   RBC 4.59 04/26/2019 1721   HGB 13.4 04/26/2019 1721   HCT 42.1 04/26/2019 1721   PLT 231 04/26/2019 1721   MCV 91.7 04/26/2019 1721   MCH 29.2 04/26/2019 1721   MCHC 31.8 04/26/2019 1721   RDW 12.8 04/26/2019 1721   LYMPHSABS 1.1 04/26/2019 1721   MONOABS 0.7 04/26/2019 1721   EOSABS 0.1 04/26/2019 1721   BASOSABS 0.1 04/26/2019 1721    No results found for:  POCLITH, LITHIUM   No results found for: PHENYTOIN, PHENOBARB, VALPROATE, CBMZ   .res Assessment: Plan:    Plan:  PDMP reviewed  1. Continue Effexor 225mg  daily 2. Continue Abilify 5mg  daily  RTC 3 months  Patient advised to contact office with any questions, adverse effects, or acute worsening in signs and symptoms.  Discussed potential metabolic side effects associated with atypical antipsychotics, as well as potential risk for movement side effects. Advised pt to contact office if movement side effects occur.    Diagnoses and all orders for this visit:  PTSD (post-traumatic stress disorder) -     ARIPiprazole (ABILIFY) 5 MG tablet; Take 1 tablet (5 mg total) by mouth daily.  Major depressive disorder, recurrent episode, moderate (HCC) -     ARIPiprazole (ABILIFY) 5 MG tablet; Take 1 tablet (5 mg total) by mouth daily.  Generalized anxiety disorder -     ARIPiprazole (ABILIFY) 5 MG tablet; Take 1 tablet (5 mg total) by mouth daily.  Insomnia, unspecified type -     ARIPiprazole (ABILIFY) 5 MG tablet; Take 1 tablet (5 mg total) by mouth daily.     Please see After Visit Summary for patient specific instructions.  No future appointments.  No orders of the defined types were placed in this encounter.   -------------------------------

## 2019-12-20 DIAGNOSIS — M810 Age-related osteoporosis without current pathological fracture: Secondary | ICD-10-CM | POA: Diagnosis not present

## 2019-12-21 DIAGNOSIS — E785 Hyperlipidemia, unspecified: Secondary | ICD-10-CM | POA: Diagnosis not present

## 2019-12-21 DIAGNOSIS — Z79891 Long term (current) use of opiate analgesic: Secondary | ICD-10-CM | POA: Diagnosis not present

## 2019-12-21 DIAGNOSIS — I10 Essential (primary) hypertension: Secondary | ICD-10-CM | POA: Diagnosis not present

## 2019-12-21 DIAGNOSIS — G894 Chronic pain syndrome: Secondary | ICD-10-CM | POA: Diagnosis not present

## 2019-12-21 DIAGNOSIS — M1991 Primary osteoarthritis, unspecified site: Secondary | ICD-10-CM | POA: Diagnosis not present

## 2019-12-21 DIAGNOSIS — G4730 Sleep apnea, unspecified: Secondary | ICD-10-CM | POA: Diagnosis not present

## 2019-12-21 DIAGNOSIS — Z9181 History of falling: Secondary | ICD-10-CM | POA: Diagnosis not present

## 2019-12-21 DIAGNOSIS — R202 Paresthesia of skin: Secondary | ICD-10-CM | POA: Diagnosis not present

## 2019-12-21 DIAGNOSIS — G629 Polyneuropathy, unspecified: Secondary | ICD-10-CM | POA: Diagnosis not present

## 2019-12-21 DIAGNOSIS — R338 Other retention of urine: Secondary | ICD-10-CM | POA: Diagnosis not present

## 2019-12-21 DIAGNOSIS — M21371 Foot drop, right foot: Secondary | ICD-10-CM | POA: Diagnosis not present

## 2019-12-21 DIAGNOSIS — Z87440 Personal history of urinary (tract) infections: Secondary | ICD-10-CM | POA: Diagnosis not present

## 2019-12-21 DIAGNOSIS — G2581 Restless legs syndrome: Secondary | ICD-10-CM | POA: Diagnosis not present

## 2019-12-28 DIAGNOSIS — Z9181 History of falling: Secondary | ICD-10-CM | POA: Diagnosis not present

## 2019-12-28 DIAGNOSIS — Z79891 Long term (current) use of opiate analgesic: Secondary | ICD-10-CM | POA: Diagnosis not present

## 2019-12-28 DIAGNOSIS — G2581 Restless legs syndrome: Secondary | ICD-10-CM | POA: Diagnosis not present

## 2019-12-28 DIAGNOSIS — I10 Essential (primary) hypertension: Secondary | ICD-10-CM | POA: Diagnosis not present

## 2019-12-28 DIAGNOSIS — M21371 Foot drop, right foot: Secondary | ICD-10-CM | POA: Diagnosis not present

## 2019-12-28 DIAGNOSIS — G4730 Sleep apnea, unspecified: Secondary | ICD-10-CM | POA: Diagnosis not present

## 2019-12-28 DIAGNOSIS — Z87440 Personal history of urinary (tract) infections: Secondary | ICD-10-CM | POA: Diagnosis not present

## 2019-12-28 DIAGNOSIS — R202 Paresthesia of skin: Secondary | ICD-10-CM | POA: Diagnosis not present

## 2019-12-28 DIAGNOSIS — G894 Chronic pain syndrome: Secondary | ICD-10-CM | POA: Diagnosis not present

## 2019-12-28 DIAGNOSIS — E785 Hyperlipidemia, unspecified: Secondary | ICD-10-CM | POA: Diagnosis not present

## 2019-12-28 DIAGNOSIS — G629 Polyneuropathy, unspecified: Secondary | ICD-10-CM | POA: Diagnosis not present

## 2019-12-28 DIAGNOSIS — R338 Other retention of urine: Secondary | ICD-10-CM | POA: Diagnosis not present

## 2019-12-28 DIAGNOSIS — M1991 Primary osteoarthritis, unspecified site: Secondary | ICD-10-CM | POA: Diagnosis not present

## 2020-01-01 DIAGNOSIS — G894 Chronic pain syndrome: Secondary | ICD-10-CM | POA: Diagnosis not present

## 2020-01-01 DIAGNOSIS — Z681 Body mass index (BMI) 19 or less, adult: Secondary | ICD-10-CM | POA: Diagnosis not present

## 2020-01-02 DIAGNOSIS — M810 Age-related osteoporosis without current pathological fracture: Secondary | ICD-10-CM | POA: Diagnosis not present

## 2020-01-02 DIAGNOSIS — M8589 Other specified disorders of bone density and structure, multiple sites: Secondary | ICD-10-CM | POA: Diagnosis not present

## 2020-01-12 DIAGNOSIS — R338 Other retention of urine: Secondary | ICD-10-CM | POA: Diagnosis not present

## 2020-01-12 DIAGNOSIS — G4730 Sleep apnea, unspecified: Secondary | ICD-10-CM | POA: Diagnosis not present

## 2020-01-12 DIAGNOSIS — G629 Polyneuropathy, unspecified: Secondary | ICD-10-CM | POA: Diagnosis not present

## 2020-01-12 DIAGNOSIS — G894 Chronic pain syndrome: Secondary | ICD-10-CM | POA: Diagnosis not present

## 2020-01-12 DIAGNOSIS — Z79891 Long term (current) use of opiate analgesic: Secondary | ICD-10-CM | POA: Diagnosis not present

## 2020-01-12 DIAGNOSIS — E785 Hyperlipidemia, unspecified: Secondary | ICD-10-CM | POA: Diagnosis not present

## 2020-01-12 DIAGNOSIS — F32A Depression, unspecified: Secondary | ICD-10-CM | POA: Diagnosis not present

## 2020-01-12 DIAGNOSIS — Z9181 History of falling: Secondary | ICD-10-CM | POA: Diagnosis not present

## 2020-01-12 DIAGNOSIS — M1991 Primary osteoarthritis, unspecified site: Secondary | ICD-10-CM | POA: Diagnosis not present

## 2020-01-12 DIAGNOSIS — M21371 Foot drop, right foot: Secondary | ICD-10-CM | POA: Diagnosis not present

## 2020-01-12 DIAGNOSIS — G2581 Restless legs syndrome: Secondary | ICD-10-CM | POA: Diagnosis not present

## 2020-01-12 DIAGNOSIS — I10 Essential (primary) hypertension: Secondary | ICD-10-CM | POA: Diagnosis not present

## 2020-01-17 DIAGNOSIS — Z9181 History of falling: Secondary | ICD-10-CM | POA: Diagnosis not present

## 2020-01-17 DIAGNOSIS — G4730 Sleep apnea, unspecified: Secondary | ICD-10-CM | POA: Diagnosis not present

## 2020-01-17 DIAGNOSIS — R338 Other retention of urine: Secondary | ICD-10-CM | POA: Diagnosis not present

## 2020-01-17 DIAGNOSIS — M21371 Foot drop, right foot: Secondary | ICD-10-CM | POA: Diagnosis not present

## 2020-01-17 DIAGNOSIS — Z79891 Long term (current) use of opiate analgesic: Secondary | ICD-10-CM | POA: Diagnosis not present

## 2020-01-17 DIAGNOSIS — G2581 Restless legs syndrome: Secondary | ICD-10-CM | POA: Diagnosis not present

## 2020-01-17 DIAGNOSIS — G894 Chronic pain syndrome: Secondary | ICD-10-CM | POA: Diagnosis not present

## 2020-01-17 DIAGNOSIS — I10 Essential (primary) hypertension: Secondary | ICD-10-CM | POA: Diagnosis not present

## 2020-01-17 DIAGNOSIS — E785 Hyperlipidemia, unspecified: Secondary | ICD-10-CM | POA: Diagnosis not present

## 2020-01-17 DIAGNOSIS — G629 Polyneuropathy, unspecified: Secondary | ICD-10-CM | POA: Diagnosis not present

## 2020-01-17 DIAGNOSIS — F32A Depression, unspecified: Secondary | ICD-10-CM | POA: Diagnosis not present

## 2020-01-17 DIAGNOSIS — M1991 Primary osteoarthritis, unspecified site: Secondary | ICD-10-CM | POA: Diagnosis not present

## 2020-01-25 DIAGNOSIS — M21371 Foot drop, right foot: Secondary | ICD-10-CM | POA: Diagnosis not present

## 2020-01-25 DIAGNOSIS — I10 Essential (primary) hypertension: Secondary | ICD-10-CM | POA: Diagnosis not present

## 2020-01-25 DIAGNOSIS — Z9181 History of falling: Secondary | ICD-10-CM | POA: Diagnosis not present

## 2020-01-25 DIAGNOSIS — M1991 Primary osteoarthritis, unspecified site: Secondary | ICD-10-CM | POA: Diagnosis not present

## 2020-01-25 DIAGNOSIS — G4730 Sleep apnea, unspecified: Secondary | ICD-10-CM | POA: Diagnosis not present

## 2020-01-25 DIAGNOSIS — G894 Chronic pain syndrome: Secondary | ICD-10-CM | POA: Diagnosis not present

## 2020-01-25 DIAGNOSIS — Z79891 Long term (current) use of opiate analgesic: Secondary | ICD-10-CM | POA: Diagnosis not present

## 2020-01-25 DIAGNOSIS — F32A Depression, unspecified: Secondary | ICD-10-CM | POA: Diagnosis not present

## 2020-01-25 DIAGNOSIS — G629 Polyneuropathy, unspecified: Secondary | ICD-10-CM | POA: Diagnosis not present

## 2020-01-25 DIAGNOSIS — E785 Hyperlipidemia, unspecified: Secondary | ICD-10-CM | POA: Diagnosis not present

## 2020-01-25 DIAGNOSIS — R338 Other retention of urine: Secondary | ICD-10-CM | POA: Diagnosis not present

## 2020-01-25 DIAGNOSIS — G2581 Restless legs syndrome: Secondary | ICD-10-CM | POA: Diagnosis not present

## 2020-01-29 DIAGNOSIS — M5136 Other intervertebral disc degeneration, lumbar region: Secondary | ICD-10-CM | POA: Diagnosis not present

## 2020-01-29 DIAGNOSIS — G894 Chronic pain syndrome: Secondary | ICD-10-CM | POA: Diagnosis not present

## 2020-01-31 DIAGNOSIS — F32A Depression, unspecified: Secondary | ICD-10-CM | POA: Diagnosis not present

## 2020-01-31 DIAGNOSIS — M1991 Primary osteoarthritis, unspecified site: Secondary | ICD-10-CM | POA: Diagnosis not present

## 2020-01-31 DIAGNOSIS — Z9181 History of falling: Secondary | ICD-10-CM | POA: Diagnosis not present

## 2020-01-31 DIAGNOSIS — Z79891 Long term (current) use of opiate analgesic: Secondary | ICD-10-CM | POA: Diagnosis not present

## 2020-01-31 DIAGNOSIS — G2581 Restless legs syndrome: Secondary | ICD-10-CM | POA: Diagnosis not present

## 2020-01-31 DIAGNOSIS — I10 Essential (primary) hypertension: Secondary | ICD-10-CM | POA: Diagnosis not present

## 2020-01-31 DIAGNOSIS — G4730 Sleep apnea, unspecified: Secondary | ICD-10-CM | POA: Diagnosis not present

## 2020-01-31 DIAGNOSIS — R338 Other retention of urine: Secondary | ICD-10-CM | POA: Diagnosis not present

## 2020-01-31 DIAGNOSIS — E785 Hyperlipidemia, unspecified: Secondary | ICD-10-CM | POA: Diagnosis not present

## 2020-01-31 DIAGNOSIS — G894 Chronic pain syndrome: Secondary | ICD-10-CM | POA: Diagnosis not present

## 2020-01-31 DIAGNOSIS — M21371 Foot drop, right foot: Secondary | ICD-10-CM | POA: Diagnosis not present

## 2020-01-31 DIAGNOSIS — G629 Polyneuropathy, unspecified: Secondary | ICD-10-CM | POA: Diagnosis not present

## 2020-02-07 DIAGNOSIS — I10 Essential (primary) hypertension: Secondary | ICD-10-CM | POA: Diagnosis not present

## 2020-02-07 DIAGNOSIS — E785 Hyperlipidemia, unspecified: Secondary | ICD-10-CM | POA: Diagnosis not present

## 2020-02-07 DIAGNOSIS — Z9181 History of falling: Secondary | ICD-10-CM | POA: Diagnosis not present

## 2020-02-07 DIAGNOSIS — G629 Polyneuropathy, unspecified: Secondary | ICD-10-CM | POA: Diagnosis not present

## 2020-02-07 DIAGNOSIS — G894 Chronic pain syndrome: Secondary | ICD-10-CM | POA: Diagnosis not present

## 2020-02-07 DIAGNOSIS — Z79891 Long term (current) use of opiate analgesic: Secondary | ICD-10-CM | POA: Diagnosis not present

## 2020-02-07 DIAGNOSIS — G2581 Restless legs syndrome: Secondary | ICD-10-CM | POA: Diagnosis not present

## 2020-02-07 DIAGNOSIS — R338 Other retention of urine: Secondary | ICD-10-CM | POA: Diagnosis not present

## 2020-02-07 DIAGNOSIS — M21371 Foot drop, right foot: Secondary | ICD-10-CM | POA: Diagnosis not present

## 2020-02-07 DIAGNOSIS — G4730 Sleep apnea, unspecified: Secondary | ICD-10-CM | POA: Diagnosis not present

## 2020-02-07 DIAGNOSIS — F32A Depression, unspecified: Secondary | ICD-10-CM | POA: Diagnosis not present

## 2020-02-07 DIAGNOSIS — M1991 Primary osteoarthritis, unspecified site: Secondary | ICD-10-CM | POA: Diagnosis not present

## 2020-02-09 DIAGNOSIS — I10 Essential (primary) hypertension: Secondary | ICD-10-CM | POA: Diagnosis not present

## 2020-02-11 ENCOUNTER — Other Ambulatory Visit: Payer: Self-pay | Admitting: Adult Health

## 2020-02-11 DIAGNOSIS — F411 Generalized anxiety disorder: Secondary | ICD-10-CM

## 2020-02-11 DIAGNOSIS — F331 Major depressive disorder, recurrent, moderate: Secondary | ICD-10-CM

## 2020-02-11 DIAGNOSIS — F4310 Post-traumatic stress disorder, unspecified: Secondary | ICD-10-CM

## 2020-02-11 DIAGNOSIS — G4700 Insomnia, unspecified: Secondary | ICD-10-CM

## 2020-02-12 DIAGNOSIS — R350 Frequency of micturition: Secondary | ICD-10-CM | POA: Diagnosis not present

## 2020-02-12 DIAGNOSIS — R3915 Urgency of urination: Secondary | ICD-10-CM | POA: Diagnosis not present

## 2020-02-13 DIAGNOSIS — E785 Hyperlipidemia, unspecified: Secondary | ICD-10-CM | POA: Diagnosis not present

## 2020-02-13 DIAGNOSIS — R338 Other retention of urine: Secondary | ICD-10-CM | POA: Diagnosis not present

## 2020-02-13 DIAGNOSIS — Z79891 Long term (current) use of opiate analgesic: Secondary | ICD-10-CM | POA: Diagnosis not present

## 2020-02-13 DIAGNOSIS — G629 Polyneuropathy, unspecified: Secondary | ICD-10-CM | POA: Diagnosis not present

## 2020-02-13 DIAGNOSIS — G894 Chronic pain syndrome: Secondary | ICD-10-CM | POA: Diagnosis not present

## 2020-02-13 DIAGNOSIS — I10 Essential (primary) hypertension: Secondary | ICD-10-CM | POA: Diagnosis not present

## 2020-02-13 DIAGNOSIS — G2581 Restless legs syndrome: Secondary | ICD-10-CM | POA: Diagnosis not present

## 2020-02-13 DIAGNOSIS — Z9181 History of falling: Secondary | ICD-10-CM | POA: Diagnosis not present

## 2020-02-13 DIAGNOSIS — F32A Depression, unspecified: Secondary | ICD-10-CM | POA: Diagnosis not present

## 2020-02-13 DIAGNOSIS — G4730 Sleep apnea, unspecified: Secondary | ICD-10-CM | POA: Diagnosis not present

## 2020-02-13 DIAGNOSIS — M21371 Foot drop, right foot: Secondary | ICD-10-CM | POA: Diagnosis not present

## 2020-02-13 DIAGNOSIS — M1991 Primary osteoarthritis, unspecified site: Secondary | ICD-10-CM | POA: Diagnosis not present

## 2020-02-19 DIAGNOSIS — G4730 Sleep apnea, unspecified: Secondary | ICD-10-CM | POA: Diagnosis not present

## 2020-02-19 DIAGNOSIS — I10 Essential (primary) hypertension: Secondary | ICD-10-CM | POA: Diagnosis not present

## 2020-02-19 DIAGNOSIS — G894 Chronic pain syndrome: Secondary | ICD-10-CM | POA: Diagnosis not present

## 2020-02-19 DIAGNOSIS — G2581 Restless legs syndrome: Secondary | ICD-10-CM | POA: Diagnosis not present

## 2020-02-19 DIAGNOSIS — M1991 Primary osteoarthritis, unspecified site: Secondary | ICD-10-CM | POA: Diagnosis not present

## 2020-02-19 DIAGNOSIS — M21371 Foot drop, right foot: Secondary | ICD-10-CM | POA: Diagnosis not present

## 2020-02-19 DIAGNOSIS — R338 Other retention of urine: Secondary | ICD-10-CM | POA: Diagnosis not present

## 2020-02-19 DIAGNOSIS — G629 Polyneuropathy, unspecified: Secondary | ICD-10-CM | POA: Diagnosis not present

## 2020-02-19 DIAGNOSIS — E785 Hyperlipidemia, unspecified: Secondary | ICD-10-CM | POA: Diagnosis not present

## 2020-02-19 DIAGNOSIS — Z9181 History of falling: Secondary | ICD-10-CM | POA: Diagnosis not present

## 2020-02-19 DIAGNOSIS — Z79891 Long term (current) use of opiate analgesic: Secondary | ICD-10-CM | POA: Diagnosis not present

## 2020-02-19 DIAGNOSIS — F32A Depression, unspecified: Secondary | ICD-10-CM | POA: Diagnosis not present

## 2020-02-21 DIAGNOSIS — G894 Chronic pain syndrome: Secondary | ICD-10-CM | POA: Diagnosis not present

## 2020-02-21 DIAGNOSIS — I10 Essential (primary) hypertension: Secondary | ICD-10-CM | POA: Diagnosis not present

## 2020-02-21 DIAGNOSIS — M21371 Foot drop, right foot: Secondary | ICD-10-CM | POA: Diagnosis not present

## 2020-02-21 DIAGNOSIS — G629 Polyneuropathy, unspecified: Secondary | ICD-10-CM | POA: Diagnosis not present

## 2020-02-27 DIAGNOSIS — G894 Chronic pain syndrome: Secondary | ICD-10-CM | POA: Diagnosis not present

## 2020-02-27 DIAGNOSIS — M5136 Other intervertebral disc degeneration, lumbar region: Secondary | ICD-10-CM | POA: Diagnosis not present

## 2020-02-28 DIAGNOSIS — I10 Essential (primary) hypertension: Secondary | ICD-10-CM | POA: Diagnosis not present

## 2020-02-28 DIAGNOSIS — E785 Hyperlipidemia, unspecified: Secondary | ICD-10-CM | POA: Diagnosis not present

## 2020-02-28 DIAGNOSIS — Z79891 Long term (current) use of opiate analgesic: Secondary | ICD-10-CM | POA: Diagnosis not present

## 2020-02-28 DIAGNOSIS — G4730 Sleep apnea, unspecified: Secondary | ICD-10-CM | POA: Diagnosis not present

## 2020-02-28 DIAGNOSIS — G894 Chronic pain syndrome: Secondary | ICD-10-CM | POA: Diagnosis not present

## 2020-02-28 DIAGNOSIS — M21371 Foot drop, right foot: Secondary | ICD-10-CM | POA: Diagnosis not present

## 2020-02-28 DIAGNOSIS — G629 Polyneuropathy, unspecified: Secondary | ICD-10-CM | POA: Diagnosis not present

## 2020-02-28 DIAGNOSIS — Z9181 History of falling: Secondary | ICD-10-CM | POA: Diagnosis not present

## 2020-02-28 DIAGNOSIS — F32A Depression, unspecified: Secondary | ICD-10-CM | POA: Diagnosis not present

## 2020-02-28 DIAGNOSIS — G2581 Restless legs syndrome: Secondary | ICD-10-CM | POA: Diagnosis not present

## 2020-02-28 DIAGNOSIS — M1991 Primary osteoarthritis, unspecified site: Secondary | ICD-10-CM | POA: Diagnosis not present

## 2020-02-28 DIAGNOSIS — R338 Other retention of urine: Secondary | ICD-10-CM | POA: Diagnosis not present

## 2020-03-08 DIAGNOSIS — G2581 Restless legs syndrome: Secondary | ICD-10-CM | POA: Diagnosis not present

## 2020-03-08 DIAGNOSIS — F32A Depression, unspecified: Secondary | ICD-10-CM | POA: Diagnosis not present

## 2020-03-08 DIAGNOSIS — G629 Polyneuropathy, unspecified: Secondary | ICD-10-CM | POA: Diagnosis not present

## 2020-03-08 DIAGNOSIS — R338 Other retention of urine: Secondary | ICD-10-CM | POA: Diagnosis not present

## 2020-03-08 DIAGNOSIS — I10 Essential (primary) hypertension: Secondary | ICD-10-CM | POA: Diagnosis not present

## 2020-03-08 DIAGNOSIS — G894 Chronic pain syndrome: Secondary | ICD-10-CM | POA: Diagnosis not present

## 2020-03-08 DIAGNOSIS — G4730 Sleep apnea, unspecified: Secondary | ICD-10-CM | POA: Diagnosis not present

## 2020-03-08 DIAGNOSIS — Z79891 Long term (current) use of opiate analgesic: Secondary | ICD-10-CM | POA: Diagnosis not present

## 2020-03-08 DIAGNOSIS — E785 Hyperlipidemia, unspecified: Secondary | ICD-10-CM | POA: Diagnosis not present

## 2020-03-08 DIAGNOSIS — M21371 Foot drop, right foot: Secondary | ICD-10-CM | POA: Diagnosis not present

## 2020-03-08 DIAGNOSIS — Z9181 History of falling: Secondary | ICD-10-CM | POA: Diagnosis not present

## 2020-03-08 DIAGNOSIS — M1991 Primary osteoarthritis, unspecified site: Secondary | ICD-10-CM | POA: Diagnosis not present

## 2020-03-09 DIAGNOSIS — I10 Essential (primary) hypertension: Secondary | ICD-10-CM | POA: Diagnosis not present

## 2020-03-12 DIAGNOSIS — Z9181 History of falling: Secondary | ICD-10-CM | POA: Diagnosis not present

## 2020-03-12 DIAGNOSIS — R338 Other retention of urine: Secondary | ICD-10-CM | POA: Diagnosis not present

## 2020-03-12 DIAGNOSIS — G629 Polyneuropathy, unspecified: Secondary | ICD-10-CM | POA: Diagnosis not present

## 2020-03-12 DIAGNOSIS — G894 Chronic pain syndrome: Secondary | ICD-10-CM | POA: Diagnosis not present

## 2020-03-12 DIAGNOSIS — Z79891 Long term (current) use of opiate analgesic: Secondary | ICD-10-CM | POA: Diagnosis not present

## 2020-03-12 DIAGNOSIS — I10 Essential (primary) hypertension: Secondary | ICD-10-CM | POA: Diagnosis not present

## 2020-03-12 DIAGNOSIS — G2581 Restless legs syndrome: Secondary | ICD-10-CM | POA: Diagnosis not present

## 2020-03-12 DIAGNOSIS — E785 Hyperlipidemia, unspecified: Secondary | ICD-10-CM | POA: Diagnosis not present

## 2020-03-12 DIAGNOSIS — F32A Depression, unspecified: Secondary | ICD-10-CM | POA: Diagnosis not present

## 2020-03-12 DIAGNOSIS — M21371 Foot drop, right foot: Secondary | ICD-10-CM | POA: Diagnosis not present

## 2020-03-12 DIAGNOSIS — M1991 Primary osteoarthritis, unspecified site: Secondary | ICD-10-CM | POA: Diagnosis not present

## 2020-03-12 DIAGNOSIS — G4730 Sleep apnea, unspecified: Secondary | ICD-10-CM | POA: Diagnosis not present

## 2020-03-18 DIAGNOSIS — I10 Essential (primary) hypertension: Secondary | ICD-10-CM | POA: Diagnosis not present

## 2020-03-18 DIAGNOSIS — Z9181 History of falling: Secondary | ICD-10-CM | POA: Diagnosis not present

## 2020-03-18 DIAGNOSIS — F32A Depression, unspecified: Secondary | ICD-10-CM | POA: Diagnosis not present

## 2020-03-18 DIAGNOSIS — G4730 Sleep apnea, unspecified: Secondary | ICD-10-CM | POA: Diagnosis not present

## 2020-03-18 DIAGNOSIS — E785 Hyperlipidemia, unspecified: Secondary | ICD-10-CM | POA: Diagnosis not present

## 2020-03-18 DIAGNOSIS — Z79891 Long term (current) use of opiate analgesic: Secondary | ICD-10-CM | POA: Diagnosis not present

## 2020-03-18 DIAGNOSIS — R338 Other retention of urine: Secondary | ICD-10-CM | POA: Diagnosis not present

## 2020-03-18 DIAGNOSIS — G2581 Restless legs syndrome: Secondary | ICD-10-CM | POA: Diagnosis not present

## 2020-03-18 DIAGNOSIS — G629 Polyneuropathy, unspecified: Secondary | ICD-10-CM | POA: Diagnosis not present

## 2020-03-18 DIAGNOSIS — G894 Chronic pain syndrome: Secondary | ICD-10-CM | POA: Diagnosis not present

## 2020-03-18 DIAGNOSIS — M21371 Foot drop, right foot: Secondary | ICD-10-CM | POA: Diagnosis not present

## 2020-03-18 DIAGNOSIS — M1991 Primary osteoarthritis, unspecified site: Secondary | ICD-10-CM | POA: Diagnosis not present

## 2020-03-19 ENCOUNTER — Encounter: Payer: Self-pay | Admitting: Adult Health

## 2020-03-19 ENCOUNTER — Telehealth (INDEPENDENT_AMBULATORY_CARE_PROVIDER_SITE_OTHER): Payer: Medicare Other | Admitting: Adult Health

## 2020-03-19 DIAGNOSIS — F411 Generalized anxiety disorder: Secondary | ICD-10-CM | POA: Diagnosis not present

## 2020-03-19 DIAGNOSIS — F331 Major depressive disorder, recurrent, moderate: Secondary | ICD-10-CM

## 2020-03-19 DIAGNOSIS — F4310 Post-traumatic stress disorder, unspecified: Secondary | ICD-10-CM

## 2020-03-19 DIAGNOSIS — G4700 Insomnia, unspecified: Secondary | ICD-10-CM | POA: Diagnosis not present

## 2020-03-19 MED ORDER — ARIPIPRAZOLE 5 MG PO TABS: 5.0000 mg | ORAL_TABLET | Freq: Every day | ORAL | 3 refills | Status: DC

## 2020-03-19 MED ORDER — TRAZODONE HCL 50 MG PO TABS: 50.0000 mg | ORAL_TABLET | Freq: Every day | ORAL | 5 refills | Status: DC

## 2020-03-19 NOTE — Progress Notes (Signed)
Luke Foster [id number] 1950-12-13 70 y.o.  Subjective:   Patient ID:  Luke Foster is a 69 y.o. (DOB Jun 15, 1950) male.  Chief Complaint: No chief complaint on file.   HPI Luke Foster presents to the office today for follow-up of MDD, GAD, PTSD, and insomnia.  Describes mood today as "ok. Pleasant. Decreased tearfulness. Mood symptoms - reports depression, but "not as much". Denies anxiety and irritability. Stating "I'm better than I was". Feels like Abilify continues to work well. Is struggling with sleep and is willing to try Trazadone. Family and dog doing well. Varying interest and motivation. Taking medications as prescribed.  Energy levels remain low. Active, does not have a regular exercise routine with current physical disabilities.  Enjoys some usual interests and activities. Divorced. Lives alone with 12 year old dog - "Maggie". Has a brother and 2 sisters. Spending time with family. Appetite adequate. Weight stable - 186 pounds.  Sleeps better some nights than others. Has trouble getting to sleep. Averages 3 hours. Denies daytime napping.  Focus and concentration stable. Completing tasks. Managing aspects of household. Disabled since 2007. Chronic pain. Back and neck injuries - childhood and adulthood.   Denies SI or HI.  Denies AH or VH.  Previous medication trials: Effexor XR 225mg  daily, Risperdal - not helpful  Review of Systems:  Review of Systems  Musculoskeletal: Negative for gait problem.  Neurological: Negative for tremors.  Psychiatric/Behavioral:       Please refer to HPI    Medications: I have reviewed the patient's current medications.  Current Outpatient Medications  Medication Sig Dispense Refill  . traZODone (DESYREL) 50 MG tablet Take 1 tablet (50 mg total) by mouth at bedtime. 30 tablet 5  . ARIPiprazole (ABILIFY) 5 MG tablet Take 1 tablet (5 mg total) by mouth daily. 90 tablet 3  . cyclobenzaprine (FLEXERIL) 10 MG tablet Take 10 mg by mouth 2  (two) times daily as needed for muscle spasms.     . diazepam (VALIUM) 10 MG tablet Take 10 mg by mouth 2 (two) times daily as needed. For restless leg    . diltiazem (CARDIZEM CD) 120 MG 24 hr capsule Take 120 mg by mouth every evening.     . enalapril (VASOTEC) 5 MG tablet Take 5 mg by mouth 2 (two) times daily.     Marland Kitchen lidocaine (LIDODERM) 5 % Place 1 patch onto the skin daily as needed (pain.). Remove & Discard patch within 12 hours or as directed by MD     . mirabegron ER (MYRBETRIQ) 50 MG TB24 tablet Take by mouth.    . oxybutynin (DITROPAN) 5 MG tablet Take 1 tablet (5 mg total) by mouth 3 (three) times daily. X 5 days (Patient not taking: Reported on 05/09/2019) 15 tablet 0  . Oxycodone HCl 10 MG TABS Take 1 tablet (10 mg total) by mouth every 6 (six) hours as needed for moderate pain or severe pain. 12 tablet 0  . tamsulosin (FLOMAX) 0.4 MG CAPS capsule Take 0.4 mg by mouth every evening.     . venlafaxine XR (EFFEXOR-XR) 75 MG 24 hr capsule Take 75 mg by mouth 3 (three) times daily.     . vitamin B-12 (CYANOCOBALAMIN) 500 MCG tablet Take 1 tablet (500 mcg total) by mouth daily.     No current facility-administered medications for this visit.    Medication Side Effects: None  Allergies:  Allergies  Allergen Reactions  . Erythromycin Itching    Past Medical History:  Diagnosis Date  . Arthritis   . Chronic back pain   . Chronic pain 1999   Bilateral feet (R >L)  . Difficult or painful urination   . High blood pressure   . History of kidney stones    H/O  . Nerve damage   . Pain management   . Paresthesia of foot, bilateral   . Sleep apnea    USES CPAP  . Weakness of both legs     Family History  Problem Relation Age of Onset  . Heart disease Father        Living, 93  . Breast cancer Mother        Living, 69  . Hypercholesterolemia Mother   . Healthy Brother   . Healthy Sister     Social History   Socioeconomic History  . Marital status: Single    Spouse  name: Not on file  . Number of children: 0  . Years of education: Not on file  . Highest education level: High school graduate  Occupational History  . Not on file  Tobacco Use  . Smoking status: Former Smoker    Packs/day: 2.00    Years: 10.00    Pack years: 20.00    Types: Cigarettes    Quit date: 05/21/1991    Years since quitting: 28.8  . Smokeless tobacco: Former Systems developer    Quit date: 06/08/1991  Vaping Use  . Vaping Use: Never used  Substance and Sexual Activity  . Alcohol use: No  . Drug use: No  . Sexual activity: Not on file  Other Topics Concern  . Not on file  Social History Narrative   Lives with wife.   He is on disability since 2011 due to chronic back pain.      Caffeine - one mountain dew in the morning    Social Determinants of Health   Financial Resource Strain: Not on file  Food Insecurity: Not on file  Transportation Needs: Not on file  Physical Activity: Not on file  Stress: Not on file  Social Connections: Not on file  Intimate Partner Violence: Not on file    Past Medical History, Surgical history, Social history, and Family history were reviewed and updated as appropriate.   Please see review of systems for further details on the patient's review from today.   Objective:   Physical Exam:  There were no vitals taken for this visit.  Physical Exam Constitutional:      General: He is not in acute distress. Musculoskeletal:        General: No deformity.  Neurological:     Mental Status: He is alert and oriented to person, place, and time.     Coordination: Coordination normal.  Psychiatric:        Attention and Perception: Attention and perception normal. He does not perceive auditory or visual hallucinations.        Mood and Affect: Mood normal. Mood is not anxious or depressed. Affect is not labile, blunt, angry or inappropriate.        Speech: Speech normal.        Behavior: Behavior normal.        Thought Content: Thought content  normal. Thought content is not paranoid or delusional. Thought content does not include homicidal or suicidal ideation. Thought content does not include homicidal or suicidal plan.        Cognition and Memory: Cognition and memory normal.        Judgment:  Judgment normal.     Comments: Insight intact     Lab Review:     Component Value Date/Time   NA 137 04/26/2019 1721   K 3.7 04/26/2019 1721   CL 104 04/26/2019 1721   CO2 26 04/26/2019 1721   GLUCOSE 91 04/26/2019 1721   BUN 18 04/26/2019 1721   CREATININE 1.17 04/26/2019 1721   CALCIUM 9.2 04/26/2019 1721   PROT 7.3 04/26/2019 1721   ALBUMIN 3.6 04/26/2019 1721   AST 17 04/26/2019 1721   ALT 14 04/26/2019 1721   ALKPHOS 121 04/26/2019 1721   BILITOT 0.4 04/26/2019 1721   GFRNONAA >60 04/26/2019 1721   GFRAA >60 04/26/2019 1721       Component Value Date/Time   WBC 8.8 04/26/2019 1721   RBC 4.59 04/26/2019 1721   HGB 13.4 04/26/2019 1721   HCT 42.1 04/26/2019 1721   PLT 231 04/26/2019 1721   MCV 91.7 04/26/2019 1721   MCH 29.2 04/26/2019 1721   MCHC 31.8 04/26/2019 1721   RDW 12.8 04/26/2019 1721   LYMPHSABS 1.1 04/26/2019 1721   MONOABS 0.7 04/26/2019 1721   EOSABS 0.1 04/26/2019 1721   BASOSABS 0.1 04/26/2019 1721    No results found for: POCLITH, LITHIUM   No results found for: PHENYTOIN, PHENOBARB, VALPROATE, CBMZ   .res Assessment: Plan:    Plan:  PDMP reviewed  1. Continue Effexor 225mg  daily 2. Continue Abilify 5mg  daily 3. Add Trazadone 50mg  at bedtime   RTC 3 months  Patient advised to contact office with any questions, adverse effects, or acute worsening in signs and symptoms.  Discussed potential metabolic side effects associated with atypical antipsychotics, as well as potential risk for movement side effects. Advised pt to contact office if movement side effects occur.     Diagnoses and all orders for this visit:  PTSD (post-traumatic stress disorder) -     ARIPiprazole (ABILIFY)  5 MG tablet; Take 1 tablet (5 mg total) by mouth daily.  Major depressive disorder, recurrent episode, moderate (HCC) -     ARIPiprazole (ABILIFY) 5 MG tablet; Take 1 tablet (5 mg total) by mouth daily.  Generalized anxiety disorder -     ARIPiprazole (ABILIFY) 5 MG tablet; Take 1 tablet (5 mg total) by mouth daily.  Insomnia, unspecified type -     ARIPiprazole (ABILIFY) 5 MG tablet; Take 1 tablet (5 mg total) by mouth daily. -     traZODone (DESYREL) 50 MG tablet; Take 1 tablet (50 mg total) by mouth at bedtime.     Please see After Visit Summary for patient specific instructions.  No future appointments.  No orders of the defined types were placed in this encounter.   -------------------------------

## 2020-03-21 DIAGNOSIS — T1490XA Injury, unspecified, initial encounter: Secondary | ICD-10-CM | POA: Diagnosis not present

## 2020-03-21 DIAGNOSIS — R350 Frequency of micturition: Secondary | ICD-10-CM | POA: Diagnosis not present

## 2020-03-21 DIAGNOSIS — W19XXXA Unspecified fall, initial encounter: Secondary | ICD-10-CM | POA: Diagnosis not present

## 2020-03-27 DIAGNOSIS — M21371 Foot drop, right foot: Secondary | ICD-10-CM | POA: Diagnosis not present

## 2020-03-27 DIAGNOSIS — M1991 Primary osteoarthritis, unspecified site: Secondary | ICD-10-CM | POA: Diagnosis not present

## 2020-03-27 DIAGNOSIS — F32A Depression, unspecified: Secondary | ICD-10-CM | POA: Diagnosis not present

## 2020-03-27 DIAGNOSIS — E785 Hyperlipidemia, unspecified: Secondary | ICD-10-CM | POA: Diagnosis not present

## 2020-03-27 DIAGNOSIS — R338 Other retention of urine: Secondary | ICD-10-CM | POA: Diagnosis not present

## 2020-03-27 DIAGNOSIS — G4730 Sleep apnea, unspecified: Secondary | ICD-10-CM | POA: Diagnosis not present

## 2020-03-27 DIAGNOSIS — G629 Polyneuropathy, unspecified: Secondary | ICD-10-CM | POA: Diagnosis not present

## 2020-03-27 DIAGNOSIS — Z9181 History of falling: Secondary | ICD-10-CM | POA: Diagnosis not present

## 2020-03-27 DIAGNOSIS — G2581 Restless legs syndrome: Secondary | ICD-10-CM | POA: Diagnosis not present

## 2020-03-27 DIAGNOSIS — Z79891 Long term (current) use of opiate analgesic: Secondary | ICD-10-CM | POA: Diagnosis not present

## 2020-03-27 DIAGNOSIS — G894 Chronic pain syndrome: Secondary | ICD-10-CM | POA: Diagnosis not present

## 2020-03-27 DIAGNOSIS — I10 Essential (primary) hypertension: Secondary | ICD-10-CM | POA: Diagnosis not present

## 2020-03-28 DIAGNOSIS — R609 Edema, unspecified: Secondary | ICD-10-CM | POA: Diagnosis not present

## 2020-03-28 DIAGNOSIS — Z0001 Encounter for general adult medical examination with abnormal findings: Secondary | ICD-10-CM | POA: Diagnosis not present

## 2020-03-28 DIAGNOSIS — Z681 Body mass index (BMI) 19 or less, adult: Secondary | ICD-10-CM | POA: Diagnosis not present

## 2020-03-28 DIAGNOSIS — G894 Chronic pain syndrome: Secondary | ICD-10-CM | POA: Diagnosis not present

## 2020-04-03 DIAGNOSIS — M1991 Primary osteoarthritis, unspecified site: Secondary | ICD-10-CM | POA: Diagnosis not present

## 2020-04-03 DIAGNOSIS — Z79891 Long term (current) use of opiate analgesic: Secondary | ICD-10-CM | POA: Diagnosis not present

## 2020-04-03 DIAGNOSIS — Z9181 History of falling: Secondary | ICD-10-CM | POA: Diagnosis not present

## 2020-04-03 DIAGNOSIS — G2581 Restless legs syndrome: Secondary | ICD-10-CM | POA: Diagnosis not present

## 2020-04-03 DIAGNOSIS — G894 Chronic pain syndrome: Secondary | ICD-10-CM | POA: Diagnosis not present

## 2020-04-03 DIAGNOSIS — F32A Depression, unspecified: Secondary | ICD-10-CM | POA: Diagnosis not present

## 2020-04-03 DIAGNOSIS — R338 Other retention of urine: Secondary | ICD-10-CM | POA: Diagnosis not present

## 2020-04-03 DIAGNOSIS — E785 Hyperlipidemia, unspecified: Secondary | ICD-10-CM | POA: Diagnosis not present

## 2020-04-03 DIAGNOSIS — G4730 Sleep apnea, unspecified: Secondary | ICD-10-CM | POA: Diagnosis not present

## 2020-04-03 DIAGNOSIS — M21371 Foot drop, right foot: Secondary | ICD-10-CM | POA: Diagnosis not present

## 2020-04-03 DIAGNOSIS — I10 Essential (primary) hypertension: Secondary | ICD-10-CM | POA: Diagnosis not present

## 2020-04-03 DIAGNOSIS — G629 Polyneuropathy, unspecified: Secondary | ICD-10-CM | POA: Diagnosis not present

## 2020-04-05 DIAGNOSIS — R310 Gross hematuria: Secondary | ICD-10-CM | POA: Diagnosis not present

## 2020-04-10 DIAGNOSIS — G894 Chronic pain syndrome: Secondary | ICD-10-CM | POA: Diagnosis not present

## 2020-04-10 DIAGNOSIS — M21371 Foot drop, right foot: Secondary | ICD-10-CM | POA: Diagnosis not present

## 2020-04-10 DIAGNOSIS — I10 Essential (primary) hypertension: Secondary | ICD-10-CM | POA: Diagnosis not present

## 2020-04-10 DIAGNOSIS — G629 Polyneuropathy, unspecified: Secondary | ICD-10-CM | POA: Diagnosis not present

## 2020-04-11 DIAGNOSIS — G4730 Sleep apnea, unspecified: Secondary | ICD-10-CM | POA: Diagnosis not present

## 2020-04-11 DIAGNOSIS — G629 Polyneuropathy, unspecified: Secondary | ICD-10-CM | POA: Diagnosis not present

## 2020-04-11 DIAGNOSIS — I10 Essential (primary) hypertension: Secondary | ICD-10-CM | POA: Diagnosis not present

## 2020-04-11 DIAGNOSIS — G2581 Restless legs syndrome: Secondary | ICD-10-CM | POA: Diagnosis not present

## 2020-04-11 DIAGNOSIS — Z9181 History of falling: Secondary | ICD-10-CM | POA: Diagnosis not present

## 2020-04-11 DIAGNOSIS — E785 Hyperlipidemia, unspecified: Secondary | ICD-10-CM | POA: Diagnosis not present

## 2020-04-11 DIAGNOSIS — M1991 Primary osteoarthritis, unspecified site: Secondary | ICD-10-CM | POA: Diagnosis not present

## 2020-04-11 DIAGNOSIS — Z79891 Long term (current) use of opiate analgesic: Secondary | ICD-10-CM | POA: Diagnosis not present

## 2020-04-11 DIAGNOSIS — G894 Chronic pain syndrome: Secondary | ICD-10-CM | POA: Diagnosis not present

## 2020-04-11 DIAGNOSIS — M21371 Foot drop, right foot: Secondary | ICD-10-CM | POA: Diagnosis not present

## 2020-04-11 DIAGNOSIS — R338 Other retention of urine: Secondary | ICD-10-CM | POA: Diagnosis not present

## 2020-04-11 DIAGNOSIS — F32A Depression, unspecified: Secondary | ICD-10-CM | POA: Diagnosis not present

## 2020-04-22 DIAGNOSIS — R296 Repeated falls: Secondary | ICD-10-CM | POA: Diagnosis not present

## 2020-04-22 DIAGNOSIS — M6284 Sarcopenia: Secondary | ICD-10-CM | POA: Diagnosis not present

## 2020-04-22 DIAGNOSIS — R6889 Other general symptoms and signs: Secondary | ICD-10-CM | POA: Diagnosis not present

## 2020-04-22 DIAGNOSIS — L8995 Pressure ulcer of unspecified site, unstageable: Secondary | ICD-10-CM | POA: Diagnosis not present

## 2020-04-24 DIAGNOSIS — Z9181 History of falling: Secondary | ICD-10-CM | POA: Diagnosis not present

## 2020-04-24 DIAGNOSIS — R338 Other retention of urine: Secondary | ICD-10-CM | POA: Diagnosis not present

## 2020-04-24 DIAGNOSIS — I10 Essential (primary) hypertension: Secondary | ICD-10-CM | POA: Diagnosis not present

## 2020-04-24 DIAGNOSIS — G629 Polyneuropathy, unspecified: Secondary | ICD-10-CM | POA: Diagnosis not present

## 2020-04-24 DIAGNOSIS — Z79891 Long term (current) use of opiate analgesic: Secondary | ICD-10-CM | POA: Diagnosis not present

## 2020-04-24 DIAGNOSIS — E785 Hyperlipidemia, unspecified: Secondary | ICD-10-CM | POA: Diagnosis not present

## 2020-04-24 DIAGNOSIS — G2581 Restless legs syndrome: Secondary | ICD-10-CM | POA: Diagnosis not present

## 2020-04-24 DIAGNOSIS — G4730 Sleep apnea, unspecified: Secondary | ICD-10-CM | POA: Diagnosis not present

## 2020-04-24 DIAGNOSIS — G894 Chronic pain syndrome: Secondary | ICD-10-CM | POA: Diagnosis not present

## 2020-04-24 DIAGNOSIS — F32A Depression, unspecified: Secondary | ICD-10-CM | POA: Diagnosis not present

## 2020-04-24 DIAGNOSIS — M21371 Foot drop, right foot: Secondary | ICD-10-CM | POA: Diagnosis not present

## 2020-04-24 DIAGNOSIS — M1991 Primary osteoarthritis, unspecified site: Secondary | ICD-10-CM | POA: Diagnosis not present

## 2020-04-28 DIAGNOSIS — R531 Weakness: Secondary | ICD-10-CM | POA: Diagnosis not present

## 2020-04-28 DIAGNOSIS — R339 Retention of urine, unspecified: Secondary | ICD-10-CM | POA: Diagnosis not present

## 2020-04-29 ENCOUNTER — Observation Stay (HOSPITAL_COMMUNITY): Payer: Medicare Other

## 2020-04-29 ENCOUNTER — Emergency Department (HOSPITAL_COMMUNITY): Payer: Medicare Other

## 2020-04-29 ENCOUNTER — Other Ambulatory Visit: Payer: Self-pay

## 2020-04-29 ENCOUNTER — Observation Stay (HOSPITAL_COMMUNITY)
Admission: EM | Admit: 2020-04-29 | Discharge: 2020-05-02 | Disposition: A | Discharge status: Home / Self Care | Payer: Medicare Other | Attending: Internal Medicine | Admitting: Internal Medicine

## 2020-04-29 DIAGNOSIS — R531 Weakness: Secondary | ICD-10-CM | POA: Diagnosis not present

## 2020-04-29 DIAGNOSIS — I10 Essential (primary) hypertension: Secondary | ICD-10-CM | POA: Insufficient documentation

## 2020-04-29 DIAGNOSIS — Z79899 Other long term (current) drug therapy: Secondary | ICD-10-CM | POA: Insufficient documentation

## 2020-04-29 DIAGNOSIS — L89152 Pressure ulcer of sacral region, stage 2: Secondary | ICD-10-CM | POA: Insufficient documentation

## 2020-04-29 DIAGNOSIS — K5903 Drug induced constipation: Secondary | ICD-10-CM

## 2020-04-29 DIAGNOSIS — K828 Other specified diseases of gallbladder: Secondary | ICD-10-CM | POA: Diagnosis not present

## 2020-04-29 DIAGNOSIS — K5900 Constipation, unspecified: Secondary | ICD-10-CM | POA: Diagnosis not present

## 2020-04-29 DIAGNOSIS — Z20822 Contact with and (suspected) exposure to covid-19: Secondary | ICD-10-CM | POA: Diagnosis not present

## 2020-04-29 DIAGNOSIS — L8990 Pressure ulcer of unspecified site, unspecified stage: Secondary | ICD-10-CM | POA: Insufficient documentation

## 2020-04-29 DIAGNOSIS — R627 Adult failure to thrive: Secondary | ICD-10-CM | POA: Insufficient documentation

## 2020-04-29 DIAGNOSIS — S0990XA Unspecified injury of head, initial encounter: Secondary | ICD-10-CM | POA: Diagnosis not present

## 2020-04-29 DIAGNOSIS — N281 Cyst of kidney, acquired: Secondary | ICD-10-CM | POA: Diagnosis not present

## 2020-04-29 DIAGNOSIS — G894 Chronic pain syndrome: Secondary | ICD-10-CM | POA: Diagnosis present

## 2020-04-29 DIAGNOSIS — R339 Retention of urine, unspecified: Secondary | ICD-10-CM | POA: Diagnosis not present

## 2020-04-29 DIAGNOSIS — F1120 Opioid dependence, uncomplicated: Secondary | ICD-10-CM | POA: Diagnosis not present

## 2020-04-29 DIAGNOSIS — I739 Peripheral vascular disease, unspecified: Secondary | ICD-10-CM | POA: Diagnosis not present

## 2020-04-29 DIAGNOSIS — N179 Acute kidney failure, unspecified: Secondary | ICD-10-CM | POA: Diagnosis not present

## 2020-04-29 DIAGNOSIS — N200 Calculus of kidney: Secondary | ICD-10-CM | POA: Diagnosis not present

## 2020-04-29 DIAGNOSIS — G319 Degenerative disease of nervous system, unspecified: Secondary | ICD-10-CM | POA: Diagnosis not present

## 2020-04-29 DIAGNOSIS — R338 Other retention of urine: Secondary | ICD-10-CM

## 2020-04-29 LAB — CBC WITH DIFFERENTIAL/PLATELET
Abs Immature Granulocytes: 0.03 10*3/uL (ref 0.00–0.07)
Basophils Absolute: 0 10*3/uL (ref 0.0–0.1)
Basophils Relative: 0 %
Eosinophils Absolute: 0 10*3/uL (ref 0.0–0.5)
Eosinophils Relative: 0 %
HCT: 48.2 % (ref 39.0–52.0)
Hemoglobin: 15.6 g/dL (ref 13.0–17.0)
Immature Granulocytes: 0 %
Lymphocytes Relative: 4 %
Lymphs Abs: 0.5 10*3/uL — ABNORMAL LOW (ref 0.7–4.0)
MCH: 29.2 pg (ref 26.0–34.0)
MCHC: 32.4 g/dL (ref 30.0–36.0)
MCV: 90.3 fL (ref 80.0–100.0)
Monocytes Absolute: 0.4 10*3/uL (ref 0.1–1.0)
Monocytes Relative: 4 %
Neutro Abs: 9.7 10*3/uL — ABNORMAL HIGH (ref 1.7–7.7)
Neutrophils Relative %: 92 %
Platelets: 195 10*3/uL (ref 150–400)
RBC: 5.34 MIL/uL (ref 4.22–5.81)
RDW: 12.3 % (ref 11.5–15.5)
WBC: 10.5 10*3/uL (ref 4.0–10.5)
nRBC: 0 % (ref 0.0–0.2)

## 2020-04-29 LAB — URINALYSIS, ROUTINE W REFLEX MICROSCOPIC
Bilirubin Urine: NEGATIVE
Glucose, UA: NEGATIVE mg/dL
Ketones, ur: 20 mg/dL — AB
Leukocytes,Ua: NEGATIVE
Nitrite: NEGATIVE
Protein, ur: NEGATIVE mg/dL
Specific Gravity, Urine: 1.014 (ref 1.005–1.030)
pH: 5 (ref 5.0–8.0)

## 2020-04-29 LAB — BASIC METABOLIC PANEL
Anion gap: 13 (ref 5–15)
BUN: 16 mg/dL (ref 8–23)
CO2: 23 mmol/L (ref 22–32)
Calcium: 9.2 mg/dL (ref 8.9–10.3)
Chloride: 102 mmol/L (ref 98–111)
Creatinine, Ser: 1.4 mg/dL — ABNORMAL HIGH (ref 0.61–1.24)
GFR, Estimated: 55 mL/min — ABNORMAL LOW (ref >60)
Glucose, Bld: 144 mg/dL — ABNORMAL HIGH (ref 70–99)
Potassium: 4.2 mmol/L (ref 3.5–5.1)
Sodium: 138 mmol/L (ref 135–145)

## 2020-04-29 MED ORDER — ENOXAPARIN SODIUM 40 MG/0.4ML ~~LOC~~ SOLN
40.0000 mg | SUBCUTANEOUS | Status: DC
  Administered 2020-04-30 – 2020-05-02 (×3): 40 mg via SUBCUTANEOUS
  Filled 2020-04-29 (×3): qty 0.4

## 2020-04-29 MED ORDER — OXYCODONE HCL 5 MG PO TABS: 15.0000 mg | ORAL_TABLET | ORAL | Status: DC

## 2020-04-29 MED ORDER — ARIPIPRAZOLE 5 MG PO TABS
5.0000 mg | ORAL_TABLET | Freq: Every day | ORAL | Status: DC
  Administered 2020-04-30 – 2020-05-02 (×3): 5 mg via ORAL
  Filled 2020-04-29 (×3): qty 1

## 2020-04-29 MED ORDER — SODIUM CHLORIDE 0.9 % IV SOLN
1.0000 g | Freq: Once | INTRAVENOUS | Status: AC
  Administered 2020-04-29: 1 g via INTRAVENOUS
  Filled 2020-04-29: qty 10

## 2020-04-29 MED ORDER — VITAMIN B-12 1000 MCG PO TABS
1000.0000 ug | ORAL_TABLET | Freq: Every day | ORAL | Status: DC
  Administered 2020-04-30 – 2020-05-02 (×3): 1000 ug via ORAL
  Filled 2020-04-29 (×3): qty 1

## 2020-04-29 MED ORDER — TRAZODONE HCL 100 MG PO TABS
50.0000 mg | ORAL_TABLET | Freq: Every day | ORAL | Status: DC
  Administered 2020-04-30 – 2020-05-01 (×2): 50 mg via ORAL
  Filled 2020-04-29 (×4): qty 0.5

## 2020-04-29 MED ORDER — VITAMIN D 25 MCG (1000 UNIT) PO TABS
1000.0000 [IU] | ORAL_TABLET | Freq: Every day | ORAL | Status: DC
  Administered 2020-04-30 – 2020-05-02 (×3): 1000 [IU] via ORAL
  Filled 2020-04-29 (×3): qty 1

## 2020-04-29 MED ORDER — SENNOSIDES-DOCUSATE SODIUM 8.6-50 MG PO TABS
1.0000 | ORAL_TABLET | Freq: Two times a day (BID) | ORAL | Status: DC
  Administered 2020-04-30 – 2020-05-01 (×3): 1 via ORAL
  Filled 2020-04-29 (×5): qty 1

## 2020-04-29 MED ORDER — OXYBUTYNIN CHLORIDE ER 10 MG PO TB24: 10.0000 mg | ORAL_TABLET | Freq: Every day | ORAL | Status: DC

## 2020-04-29 MED ORDER — ONDANSETRON HCL 4 MG/2ML IJ SOLN: 4.0000 mg | Freq: Four times a day (QID) | INTRAMUSCULAR | Status: DC | PRN

## 2020-04-29 MED ORDER — CEFTRIAXONE SODIUM 1 G IJ SOLR
1.0000 g | Freq: Once | INTRAMUSCULAR | Status: DC
  Filled 2020-04-29: qty 10

## 2020-04-29 MED ORDER — LIDOCAINE HCL URETHRAL/MUCOSAL 2 % EX GEL
1.0000 "application " | Freq: Once | CUTANEOUS | Status: AC
  Administered 2020-04-29: 1 via URETHRAL
  Filled 2020-04-29: qty 11

## 2020-04-29 MED ORDER — VENLAFAXINE HCL ER 75 MG PO CP24
75.0000 mg | ORAL_CAPSULE | Freq: Two times a day (BID) | ORAL | Status: DC
  Administered 2020-04-30 – 2020-05-02 (×6): 75 mg via ORAL
  Filled 2020-04-29 (×6): qty 1

## 2020-04-29 MED ORDER — DIAZEPAM 5 MG PO TABS: 10.0000 mg | ORAL_TABLET | Freq: Two times a day (BID) | ORAL | Status: DC | PRN

## 2020-04-29 MED ORDER — MIRABEGRON ER 25 MG PO TB24
50.0000 mg | ORAL_TABLET | Freq: Every day | ORAL | Status: DC
  Administered 2020-04-30 – 2020-05-02 (×3): 50 mg via ORAL
  Filled 2020-04-29 (×3): qty 2

## 2020-04-29 MED ORDER — DILTIAZEM HCL ER COATED BEADS 120 MG PO CP24
120.0000 mg | ORAL_CAPSULE | Freq: Every evening | ORAL | Status: DC
  Administered 2020-04-30 – 2020-05-01 (×2): 120 mg via ORAL
  Filled 2020-04-29 (×2): qty 1

## 2020-04-29 MED ORDER — ACETAMINOPHEN 650 MG RE SUPP: 650.0000 mg | Freq: Four times a day (QID) | RECTAL | Status: DC | PRN

## 2020-04-29 MED ORDER — BISACODYL 10 MG RE SUPP
10.0000 mg | Freq: Every day | RECTAL | Status: DC | PRN
  Administered 2020-04-30: 10 mg via RECTAL
  Filled 2020-04-29: qty 1

## 2020-04-29 MED ORDER — TAMSULOSIN HCL 0.4 MG PO CAPS
0.4000 mg | ORAL_CAPSULE | Freq: Every evening | ORAL | Status: DC
  Administered 2020-04-30 – 2020-05-01 (×3): 0.4 mg via ORAL
  Filled 2020-04-29 (×3): qty 1

## 2020-04-29 MED ORDER — ACETAMINOPHEN 325 MG PO TABS: 650.0000 mg | ORAL_TABLET | Freq: Four times a day (QID) | ORAL | Status: DC | PRN

## 2020-04-29 MED ORDER — ONDANSETRON HCL 4 MG PO TABS: 4.0000 mg | ORAL_TABLET | Freq: Four times a day (QID) | ORAL | Status: DC | PRN

## 2020-04-29 MED ORDER — OXYCODONE-ACETAMINOPHEN 5-325 MG PO TABS
2.0000 | ORAL_TABLET | Freq: Once | ORAL | Status: AC
  Administered 2020-04-29: 2 via ORAL
  Filled 2020-04-29: qty 2

## 2020-04-29 NOTE — H&P (Signed)
History and Physical  Patient Name: Luke Foster     UYQ:[id number]    DOB: September 16, 1950    DOA: 04/29/2020 PCP: [name], MD  Patient coming from: Home  Chief Complaint: Urinary retention and weakness    HPI: Luke Foster is a 70 y.o. male, with PMH of hypertension, urine retention, chronic pain, "catastrophic nerve damage" who presented to the ER on 04/29/2020 with urinary retention, fall, generalized weakness.  Patient states that over the past day, he has had poor urinary output.  Last time he voided was early in the morning.  He is followed by urology for his urinary/bladder issues.  He has a history of prior episodes of urinary retention, requiring Foley catheterization.  He is also on Flomax and bladder spasm/urinary retention medications.  He also has been dealing with worsening weakness.  At his baseline he has weakness due to "catastrophic nerve damage".  His brother was bedside and states he has been more altered complete and is concerned he has a UTI because he has presented like this previously.  Additionally prior to arrival he had a fall and hit his head.  He denies any chest pain or dyspnea at this time.  He has been dealing with more constipation of late and is chronically on a stool softener.  Due to these issues, EMS was called and brought him to the ER for evaluation.    ED course: -Vitals on admission: Afebrile, heart rate 112, respiratory rate 15, BP 175/97, maintaining sats on room air -Labs on initial presentation: Sodium 138, potassium 4.2, chloride 102, bicarb 23, glucose 144, BUN 16, creatinine 1.4, WBC 10.5, UA: WBC 0, leukocytes negative, nitrate negative, bacteria rare -Imaging obtained on admission: CT renal stone protocol demonstrated nonobstructing nephrolithiasis, no hydronephrosis, moderate stool burden.  CT head unremarkable for any acute processes -In the ED the patient was given pain meds, Rocephin, lidocaine patch, and had a Foley placed with over a  liter of urine returned. the hospitalist service was contacted for further evaluation and management.     ROS: A complete and thorough 12 point review of systems obtained, negative listed in HPI.     Past Medical History:  Diagnosis Date  . Arthritis   . Chronic back pain   . Chronic pain 1999   Bilateral feet (R >L)  . Difficult or painful urination   . High blood pressure   . History of kidney stones    H/O  . Nerve damage   . Pain management   . Paresthesia of foot, bilateral   . Sleep apnea    USES CPAP  . Weakness of both legs     Past Surgical History:  Procedure Laterality Date  . arm surgery    . BACK SURGERY     X2  . FACIAL COSMETIC SURGERY    . HERNIA REPAIR    . IR US GUIDE BX ASP/DRAIN  10/19/2017  . KIDNEY STONE SURGERY    . LUMBAR SPINAL CORD SIMULATOR LEAD REMOVAL N/A 03/01/2019   Procedure: LUMBAR SPINAL CORD SIMULATOR LEAD REMOVAL;  Surgeon: Meade Maw, MD;  Location: [facility] ORS;  Service: Neurosurgery;  Laterality: N/A;  . MASTECTOMY Left   . SPINAL CORD STIMULATOR REMOVAL N/A 03/01/2019   Procedure: LUMBAR SPINAL CORD STIMULATOR REMOVAL;  Surgeon: Meade Maw, MD;  Location: [facility] ORS;  Service: Neurosurgery;  Laterality: N/A;    Social History: Patient lives at home.  The patient walks with a walker. Not current smoker  Allergies  Allergen Reactions  . Erythromycin Itching    Family history: family history includes Breast cancer in his mother; Healthy in his brother and sister; Heart disease in his father; Hypercholesterolemia in his mother.  Prior to Admission medications   Medication Sig Start Date End Date Taking? Authorizing Provider  ARIPiprazole (ABILIFY) 5 MG tablet Take 1 tablet (5 mg total) by mouth daily. 03/19/20  Yes Mozingo, Berdie Ogren, NP  cholecalciferol (VITAMIN D3) 25 MCG (1000 UNIT) tablet Take 1,000 Units by mouth daily.   Yes [provider]  diazepam (VALIUM) 10 MG tablet Take 10 mg by mouth 2  (two) times daily as needed. For restless leg 04/04/19  Yes [provider]  diltiazem (CARDIZEM CD) 120 MG 24 hr capsule Take 120 mg by mouth every evening.  09/26/18  Yes [provider]  docusate sodium (COLACE) 100 MG capsule Take 100 mg by mouth in the morning, at noon, and at bedtime.   Yes [provider]  enalapril (VASOTEC) 5 MG tablet Take 5 mg by mouth 2 (two) times daily.  05/20/16  Yes [provider]  mirabegron ER (MYRBETRIQ) 50 MG TB24 tablet Take 50 mg by mouth daily.   Yes [provider]  oxyCODONE (ROXICODONE) 15 MG immediate release tablet Take 15 mg by mouth every 4 (four) hours. 03/28/20  Yes [provider]  tamsulosin (FLOMAX) 0.4 MG CAPS capsule Take 0.4 mg by mouth every evening.    Yes [provider]  tolterodine (DETROL) 1 MG tablet Take 1 mg by mouth 2 (two) times daily. 11/22/19  Yes [provider]  traZODone (DESYREL) 50 MG tablet Take 1 tablet (50 mg total) by mouth at bedtime. 03/19/20  Yes Mozingo, Berdie Ogren, NP  venlafaxine XR (EFFEXOR-XR) 75 MG 24 hr capsule Take 75 mg by mouth in the morning and at bedtime. 12/31/18  Yes [provider]  vitamin B-12 (CYANOCOBALAMIN) 500 MCG tablet Take 1 tablet (500 mcg total) by mouth daily. Patient taking differently: Take 1,000 mcg by mouth in the morning and at bedtime. 03/10/19  Yes TatShanon Brow, MD       Physical Exam: BP (!) 132/93   Pulse (!) 106   Temp 97.8 F (36.6 C) (Oral)   Resp 15   Ht 5' 9.5" (1.765 m)   Wt 83 kg   SpO2 100%   BMI 26.63 kg/m   General appearance:  Frail-appearing, adult male, alert and in no acute distress .   Eyes: Anicteric, conjunctiva pink, lids and lashes normal. PERRL.    ENT: No nasal deformity, discharge, epistaxis.  Hearing intact. OP moist without lesions.   Neck: No neck masses.  Trachea midline.  No thyromegaly/tenderness. Lymph: No cervical or supraclavicular lymphadenopathy. Skin: Warm  and dry.  No jaundice.  No suspicious rashes or lesions. Cardiac:  Tachycardic, nl S1-S2, no murmurs appreciated.  No LE edema.  Radial and pedal pulses 2+ and symmetric. Respiratory: Normal respiratory rate and rhythm.  CTAB without rales or wheezes. Abdomen: Abdomen soft.  No tenderness with palpation. No ascites, distension, hepatosplenomegaly.    Foley in place. MSK: No deformities or effusions of the large joints of the upper or lower extremities bilaterally.  No cyanosis or clubbing. Neuro: Cranial nerves 2 through 12 grossly intact.  Sensation intact to light touch. Speech is fluent.  Marland Kitchen    Psych: Sensorium intact and responding to questions, attention normal.  Behavior appropriate.  Judgment and insight appear normal.  Labs on Admission:  I have personally reviewed following labs and imaging studies: CBC: Recent Labs  Lab 04/29/20 2015  WBC 10.5  NEUTROABS 9.7*  HGB 15.6  HCT 48.2  MCV 90.3  PLT 409   Basic Metabolic Panel: Recent Labs  Lab 04/29/20 2015  NA 138  K 4.2  CL 102  CO2 23  GLUCOSE 144*  BUN 16  CREATININE 1.40*  CALCIUM 9.2   GFR: Estimated Creatinine Clearance: 51.4 mL/min (A) (by C-G formula based on SCr of 1.4 mg/dL (H)).  Liver Function Tests: No results for input(s): AST, ALT, ALKPHOS, BILITOT, PROT, ALBUMIN in the last 168 hours. No results for input(s): LIPASE, AMYLASE in the last 168 hours. No results for input(s): AMMONIA in the last 168 hours. Coagulation Profile: No results for input(s): INR, PROTIME in the last 168 hours. Cardiac Enzymes: No results for input(s): CKTOTAL, CKMB, CKMBINDEX, TROPONINI in the last 168 hours. BNP (last 3 results) No results for input(s): PROBNP in the last 8760 hours. HbA1C: No results for input(s): HGBA1C in the last 72 hours. CBG: No results for input(s): GLUCAP in the last 168 hours. Lipid Profile: No results for input(s): CHOL, HDL, LDLCALC, TRIG, CHOLHDL, LDLDIRECT in the last 72 hours. Thyroid  Function Tests: No results for input(s): TSH, T4TOTAL, FREET4, T3FREE, THYROIDAB in the last 72 hours. Anemia Panel: No results for input(s): VITAMINB12, FOLATE, FERRITIN, TIBC, IRON, RETICCTPCT in the last 72 hours.   No results found for this or any previous visit (from the past 240 hour(s)).         Radiological Exams on Admission: Personally reviewed imaging which shows: CT renal stone protocol demonstrated nonobstructing nephrolithiasis, no hydronephrosis, moderate stool burden CT Renal Stone Study  Result Date: 04/29/2020 CLINICAL DATA:  70 year old male with flank pain. Concern for kidney stone. EXAM: CT ABDOMEN AND PELVIS WITHOUT CONTRAST TECHNIQUE: Multidetector CT imaging of the abdomen and pelvis was performed following the standard protocol without IV contrast. COMPARISON:  CT abdomen pelvis dated 03/07/2019. FINDINGS: Evaluation of this exam is limited in the absence of intravenous contrast. Lower chest: The visualized lung bases are clear. Coronary vascular calcifications noted. No intra-abdominal free air or free fluid. Hepatobiliary: The liver is unremarkable. The gallbladder is distended. No calcified gallstone or pericholecystic fluid. Pancreas: Unremarkable. No pancreatic ductal dilatation or surrounding inflammatory changes. Spleen: Normal in size without focal abnormality. Adrenals/Urinary Tract: The adrenal glands are unremarkable. There is a 10 cm right renal cyst. Several nonobstructing stones noted in the mid to lower pole of the right kidney. A 15 mm curvilinear calcification in the interpolar right kidney along the wall of the cyst, new since the prior CT, and likely a nonobstructing stone and less likely focal calcification of the cyst wall. No hydronephrosis. There is a 3 cm left renal inferior pole exophytic cyst. There is no hydronephrosis or nephrolithiasis on the left. The visualized ureters appear unremarkable. The urinary bladder is decompressed around a Foley  catheter. Stomach/Bowel: There is moderate amount of stool throughout the colon with a large stool in the rectal vault. There is no bowel obstruction or active inflammation. The appendix is normal. Vascular/Lymphatic: Moderate aortoiliac atherosclerotic disease. The IVC is unremarkable. No portal venous gas. There is no adenopathy. Reproductive: The prostate gland is enlarged measuring 6 cm in transverse axial diameter. The seminal vesicles are symmetric. Other: None Musculoskeletal: Osteopenia with degenerative changes of the spine. L4-S1 posterior fusion. No acute osseous pathology. Old T12 and L1 compression fractures. IMPRESSION: 1.  Nonobstructing right renal stones. No hydronephrosis or obstructing stone. 2. Moderate colonic stool burden. No bowel obstruction. Normal appendix. 3. Aortic Atherosclerosis (ICD10-I70.0). Electronically Signed   By: Anner Crete M.D.   On: 04/29/2020 21:45         Assessment/Plan   1.  Generalized weakness -Acute on chronic generalized weakness with fall prior to arrival -Patient has history of gait instability due to "catastrophic nerve damage" -CT head on admission was negative for any acute processes -UA very underwhelming for UTI.  He was given Rocephin in the ED.  We will not continue for now, but in case should cover for 24 hours if findings of symptoms change.  Urine culture pending -PT and OT consulted -Fall precautions  2.  Urinary retention -Urinary catheter placed in the ED with over liter of urine returned -CT scan without contrast demonstrated nonobstructing nephrolithiasis, no evidence of hydronephrosis -History of issues with urinary retention in the past -Continue Foley placement -Continue home Flomax -Strict I's and O's -Consider urology consult in the a.m.  3.  Constipation -Started on scheduled Senokot-S -Added as needed enema and suppository.  If no bowel movement soon, likely will need to be more aggressive  4.  AKI -On  arrival creatinine 1.4 -Baseline creatinine appears around 1 -Possibly related to urinary retention with glomerular hemodynamic changes as he is on ACE inhibitor -Hold home ACE -Follow-up labs ordered  5.  Chronic pain -Continue home regiment    DVT prophylaxis: Lovenox Code Status: Full Family Communication: Brother bedside Disposition Plan: Anticipate discharge home with home health versus rehab when medically optimized Consults called: None Admission status: Observation   At the point of initial evaluation, it is my clinical opinion that admission for OBSERVATION is reasonable and necessary because the patient's presenting complaints in the context of their chronic conditions represent sufficient risk of deterioration or significant morbidity to constitute reasonable grounds for close observation in the hospital setting, but that the patient may be medically stable for discharge from the hospital within 24 to 48 hours.    Medical decision making: Patient seen at 11:37 PM on 04/29/2020.  The patient was discussed with ER provider.  What exists of the patient's chart was reviewed in depth and summarized above.  Clinical condition: Stable.        Doran Heater [facility] Please page though [place] or Epic secure chat:  For password, contact charge nurse

## 2020-04-29 NOTE — ED Triage Notes (Signed)
Patient BIB EMS from home, states significant urinary retention since having last urinated at 0900 this morning. States having retention for the past several days now with history of same. Patient states history of catastrophic nerve damage and recurrent falls. Patient is not a good historian and there is no family present at this time. Patient is AxOx4.

## 2020-04-29 NOTE — ED Provider Notes (Signed)
[facility] DEPT Provider Note   CSN: [id number] Arrival date & time: 04/29/20  1924     History Chief Complaint  Patient presents with  . Urinary Retention    Luke Foster is a 70 y.o. male who presents to the emergency department with a chief complaint of urinary retention.  Patient states the last time he was able to urinate with this morning.  He has had progressively worsening pain and urgency to urinate in the bladder since earlier this morning.  He denies any fever or chills but states that this has happened in the past about 1 year ago when he found that he was also septic.  HPI     Past Medical History:  Diagnosis Date  . Arthritis   . Chronic back pain   . Chronic pain 1999   Bilateral feet (R >L)  . Difficult or painful urination   . High blood pressure   . History of kidney stones    H/O  . Nerve damage   . Pain management   . Paresthesia of foot, bilateral   . Sleep apnea    USES CPAP  . Weakness of both legs     Patient Active Problem List   Diagnosis Date Noted  . Pain in limb 05/09/2019  . Acute metabolic encephalopathy 53/29/9242  . Bilateral hydronephrosis 03/07/2019  . Altered mental status 03/07/2019  . Hypertension   . Abnormal ECG 02/28/2019  . Atherosclerosis of abdominal aorta ([place]) 02/28/2019  . Benign essential HTN 02/28/2019  . Hyperlipidemia, mixed 02/28/2019  . Neuropathy 01/21/2017  . Pain in right foot 01/21/2017  . Pain in left foot 01/21/2017  . Chronic pain syndrome 01/21/2017  . Post laminectomy syndrome 01/21/2017  . Status post insertion of spinal cord stimulator 01/21/2017  . Uncomplicated opioid dependence ([place]) 01/21/2017  . Chronic bilateral low back pain without sciatica 11/27/2015  . Degenerative disc disease, lumbar 06/17/2015  . Encounter for therapeutic drug monitoring 06/17/2015  . Nervous system device, implant, or graft complication, initial encounter 04/06/2014  . Lumbosacral  radiculopathy 12/09/2012    Past Surgical History:  Procedure Laterality Date  . arm surgery    . BACK SURGERY     X2  . FACIAL COSMETIC SURGERY    . HERNIA REPAIR    . IR US GUIDE BX ASP/DRAIN  10/19/2017  . KIDNEY STONE SURGERY    . LUMBAR SPINAL CORD SIMULATOR LEAD REMOVAL N/A 03/01/2019   Procedure: LUMBAR SPINAL CORD SIMULATOR LEAD REMOVAL;  Surgeon: Meade Maw, MD;  Location: [facility] ORS;  Service: Neurosurgery;  Laterality: N/A;  . MASTECTOMY Left   . SPINAL CORD STIMULATOR REMOVAL N/A 03/01/2019   Procedure: LUMBAR SPINAL CORD STIMULATOR REMOVAL;  Surgeon: Meade Maw, MD;  Location: [facility] ORS;  Service: Neurosurgery;  Laterality: N/A;       Family History  Problem Relation Age of Onset  . Heart disease Father        Living, 75  . Breast cancer Mother        Living, 76  . Hypercholesterolemia Mother   . Healthy Brother   . Healthy Sister     Social History   Tobacco Use  . Smoking status: Former Smoker    Packs/day: 2.00    Years: 10.00    Pack years: 20.00    Types: Cigarettes    Quit date: 05/21/1991    Years since quitting: 28.9  . Smokeless tobacco: Former Systems developer    Quit date:  06/08/1991  Vaping Use  . Vaping Use: Never used  Substance Use Topics  . Alcohol use: No  . Drug use: No    Home Medications Prior to Admission medications   Medication Sig Start Date End Date Taking? Authorizing Provider  ARIPiprazole (ABILIFY) 5 MG tablet Take 1 tablet (5 mg total) by mouth daily. 03/19/20   Mozingo, Berdie Ogren, NP  cyclobenzaprine (FLEXERIL) 10 MG tablet Take 10 mg by mouth 2 (two) times daily as needed for muscle spasms.     [provider]  diazepam (VALIUM) 10 MG tablet Take 10 mg by mouth 2 (two) times daily as needed. For restless leg 04/04/19   [provider]  diltiazem (CARDIZEM CD) 120 MG 24 hr capsule Take 120 mg by mouth every evening.  09/26/18   [provider]  enalapril (VASOTEC) 5 MG tablet Take 5 mg by  mouth 2 (two) times daily.  05/20/16   [provider]  lidocaine (LIDODERM) 5 % Place 1 patch onto the skin daily as needed (pain.). Remove & Discard patch within 12 hours or as directed by MD     [provider]  mirabegron ER (MYRBETRIQ) 50 MG TB24 tablet Take by mouth.    [provider]  oxybutynin (DITROPAN) 5 MG tablet Take 1 tablet (5 mg total) by mouth 3 (three) times daily. X 5 days Patient not taking: Reported on 05/09/2019 03/09/19   Orson Eva, MD  Oxycodone HCl 10 MG TABS Take 1 tablet (10 mg total) by mouth every 6 (six) hours as needed for moderate pain or severe pain. 03/09/19   Orson Eva, MD  tamsulosin (FLOMAX) 0.4 MG CAPS capsule Take 0.4 mg by mouth every evening.     [provider]  traZODone (DESYREL) 50 MG tablet Take 1 tablet (50 mg total) by mouth at bedtime. 03/19/20   Mozingo, Berdie Ogren, NP  venlafaxine XR (EFFEXOR-XR) 75 MG 24 hr capsule Take 75 mg by mouth 3 (three) times daily.  12/31/18   [provider]  vitamin B-12 (CYANOCOBALAMIN) 500 MCG tablet Take 1 tablet (500 mcg total) by mouth daily. 03/10/19   Orson Eva, MD    Allergies    Erythromycin  Review of Systems   Review of Systems Ten systems reviewed and are negative for acute change, except as noted in the HPI.   Physical Exam Updated Vital Signs BP (!) 175/97 (BP Location: Left Arm)   Pulse (!) 112   Temp 97.8 F (36.6 C) (Oral)   Resp 15   Ht 5' 9.5" (1.765 m)   Wt 83 kg   SpO2 99%   BMI 26.63 kg/m   Physical Exam Vitals and nursing note reviewed.  Constitutional:      General: He is not in acute distress.    Appearance: He is well-developed. He is not diaphoretic.  HENT:     Head: Normocephalic and atraumatic.  Eyes:     General: No scleral icterus.    Conjunctiva/sclera: Conjunctivae normal.  Cardiovascular:     Rate and Rhythm: Normal rate and regular rhythm.     Heart sounds: Normal heart sounds.  Pulmonary:     Effort: Pulmonary  effort is normal. No respiratory distress.     Breath sounds: Normal breath sounds.  Abdominal:     General: There is distension.     Palpations: Abdomen is soft.     Tenderness: There is no abdominal tenderness.     Comments: Palpable distention of the  lower abdomen.  Fundus of the bladder is approximately 2 cm above the umbilicus.  Musculoskeletal:     Cervical back: Normal range of motion and neck supple.  Skin:    General: Skin is warm and dry.  Neurological:     Mental Status: He is alert.  Psychiatric:        Behavior: Behavior normal.     ED Results / Procedures / Treatments   Labs (all labs ordered are listed, but only abnormal results are displayed) Labs Reviewed  URINALYSIS, ROUTINE W REFLEX MICROSCOPIC  BASIC METABOLIC PANEL  CBC WITH DIFFERENTIAL/PLATELET    EKG None  Radiology No results found.  Procedures Procedures   Medications Ordered in ED Medications  lidocaine (XYLOCAINE) 2 % jelly 1 application (has no administration in time range)    ED Course  I have reviewed the triage vital signs and the nursing notes.  Pertinent labs & imaging results that were available during my care of the patient were reviewed by me and considered in my medical decision making (see chart for details).  Clinical Course as of 04/30/20 1729  Mon Apr 29, 2020  2234 Zyire Eidson- Brother [id number] [facility]  [AH]    Clinical Course User Index [AH] Margarita Mail, PA-C   MDM Rules/Calculators/A&P                          Foley catheter placed with normal urine output.  Patient is already feeling significant relief.  70 year old male here with acute urinary retention.  Catheterized urine does show bacteria treated here with IV Rocephin and a urine culture ordered.  I ordered and reviewed labs including CBC, BMP, and urine analysis as discussed above.  No significant abnormalities on CBC or BMP.  Renal stone study' nonobstructing stones, chronic right renal cyst, aortic  atherosclerosis and no other significant abnormalities.  I ordered CT head as the patient fell today and hit his head.  Discussed with Dr. Marigene Ehlers as the patient has had significant inability to walk over the past few weeks.  His brother states that this happened in the past with urinary tract infection and he lives by himself.  Given these facts I feel is unsafe for discharge at this time.  Patient will be admitted with weakness, potential urinary tract infection and will likely need PT OT evaluation.    final Clinical Impression(s) / ED Diagnoses Final diagnoses:  None    Rx / DC Orders ED Discharge Orders    None       Margarita Mail, PA-C 04/30/20 1733    Luna Fuse, MD 05/03/20 [phone]

## 2020-04-29 NOTE — ED Provider Notes (Incomplete)
[place] DEPT Provider Note   CSN: [id number] Arrival date & time: 04/29/20  1924     History Chief Complaint  Patient presents with  . Urinary Retention    Luke Foster is a 70 y.o. male.  HPI     Past Medical History:  Diagnosis Date  . Arthritis   . Chronic back pain   . Chronic pain 1999   Bilateral feet (R >L)  . Difficult or painful urination   . High blood pressure   . History of kidney stones    H/O  . Nerve damage   . Pain management   . Paresthesia of foot, bilateral   . Sleep apnea    USES CPAP  . Weakness of both legs     Patient Active Problem List   Diagnosis Date Noted  . Pain in limb 05/09/2019  . Acute metabolic encephalopathy 95/10/3265  . Bilateral hydronephrosis 03/07/2019  . Altered mental status 03/07/2019  . Hypertension   . Abnormal ECG 02/28/2019  . Atherosclerosis of abdominal aorta ([place]) 02/28/2019  . Benign essential HTN 02/28/2019  . Hyperlipidemia, mixed 02/28/2019  . Neuropathy 01/21/2017  . Pain in right foot 01/21/2017  . Pain in left foot 01/21/2017  . Chronic pain syndrome 01/21/2017  . Post laminectomy syndrome 01/21/2017  . Status post insertion of spinal cord stimulator 01/21/2017  . Uncomplicated opioid dependence ([place]) 01/21/2017  . Chronic bilateral low back pain without sciatica 11/27/2015  . Degenerative disc disease, lumbar 06/17/2015  . Encounter for therapeutic drug monitoring 06/17/2015  . Nervous system device, implant, or graft complication, initial encounter 04/06/2014  . Lumbosacral radiculopathy 12/09/2012    Past Surgical History:  Procedure Laterality Date  . arm surgery    . BACK SURGERY     X2  . FACIAL COSMETIC SURGERY    . HERNIA REPAIR    . IR US GUIDE BX ASP/DRAIN  10/19/2017  . KIDNEY STONE SURGERY    . LUMBAR SPINAL CORD SIMULATOR LEAD REMOVAL N/A 03/01/2019   Procedure: LUMBAR SPINAL CORD SIMULATOR LEAD REMOVAL;  Surgeon: Meade Maw, MD;   Location: [facility] ORS;  Service: Neurosurgery;  Laterality: N/A;  . MASTECTOMY Left   . SPINAL CORD STIMULATOR REMOVAL N/A 03/01/2019   Procedure: LUMBAR SPINAL CORD STIMULATOR REMOVAL;  Surgeon: Meade Maw, MD;  Location: [facility] ORS;  Service: Neurosurgery;  Laterality: N/A;       Family History  Problem Relation Age of Onset  . Heart disease Father        Living, 23  . Breast cancer Mother        Living, 85  . Hypercholesterolemia Mother   . Healthy Brother   . Healthy Sister     Social History   Tobacco Use  . Smoking status: Former Smoker    Packs/day: 2.00    Years: 10.00    Pack years: 20.00    Types: Cigarettes    Quit date: 05/21/1991    Years since quitting: 28.9  . Smokeless tobacco: Former Systems developer    Quit date: 06/08/1991  Vaping Use  . Vaping Use: Never used  Substance Use Topics  . Alcohol use: No  . Drug use: No    Home Medications Prior to Admission medications   Medication Sig Start Date End Date Taking? Authorizing Provider  ARIPiprazole (ABILIFY) 5 MG tablet Take 1 tablet (5 mg total) by mouth daily. 03/19/20   Mozingo, Berdie Ogren, NP  cyclobenzaprine (FLEXERIL) 10 MG tablet Take  10 mg by mouth 2 (two) times daily as needed for muscle spasms.     [provider]  diazepam (VALIUM) 10 MG tablet Take 10 mg by mouth 2 (two) times daily as needed. For restless leg 04/04/19   [provider]  diltiazem (CARDIZEM CD) 120 MG 24 hr capsule Take 120 mg by mouth every evening.  09/26/18   [provider]  enalapril (VASOTEC) 5 MG tablet Take 5 mg by mouth 2 (two) times daily.  05/20/16   [provider]  lidocaine (LIDODERM) 5 % Place 1 patch onto the skin daily as needed (pain.). Remove & Discard patch within 12 hours or as directed by MD     [provider]  mirabegron ER (MYRBETRIQ) 50 MG TB24 tablet Take by mouth.    [provider]  oxybutynin (DITROPAN) 5 MG tablet Take 1 tablet (5 mg total) by mouth 3  (three) times daily. X 5 days Patient not taking: Reported on 05/09/2019 03/09/19   Orson Eva, MD  Oxycodone HCl 10 MG TABS Take 1 tablet (10 mg total) by mouth every 6 (six) hours as needed for moderate pain or severe pain. 03/09/19   Orson Eva, MD  tamsulosin (FLOMAX) 0.4 MG CAPS capsule Take 0.4 mg by mouth every evening.     [provider]  traZODone (DESYREL) 50 MG tablet Take 1 tablet (50 mg total) by mouth at bedtime. 03/19/20   Mozingo, Berdie Ogren, NP  venlafaxine XR (EFFEXOR-XR) 75 MG 24 hr capsule Take 75 mg by mouth 3 (three) times daily.  12/31/18   [provider]  vitamin B-12 (CYANOCOBALAMIN) 500 MCG tablet Take 1 tablet (500 mcg total) by mouth daily. 03/10/19   Orson Eva, MD    Allergies    Erythromycin  Review of Systems   Review of Systems  Physical Exam Updated Vital Signs BP 139/84   Pulse (!) 105   Temp 97.8 F (36.6 C) (Oral)   Resp (!) 25   Ht 5' 9.5" (1.765 m)   Wt 83 kg   SpO2 97%   BMI 26.63 kg/m   Physical Exam  ED Results / Procedures / Treatments   Labs (all labs ordered are listed, but only abnormal results are displayed) Labs Reviewed  URINALYSIS, ROUTINE W REFLEX MICROSCOPIC - Abnormal; Notable for the following components:      Result Value   APPearance HAZY (*)    Hgb urine dipstick SMALL (*)    Ketones, ur 20 (*)    Bacteria, UA RARE (*)    All other components within normal limits  BASIC METABOLIC PANEL - Abnormal; Notable for the following components:   Glucose, Bld 144 (*)    Creatinine, Ser 1.40 (*)    GFR, Estimated 55 (*)    All other components within normal limits  CBC WITH DIFFERENTIAL/PLATELET - Abnormal; Notable for the following components:   Neutro Abs 9.7 (*)    Lymphs Abs 0.5 (*)    All other components within normal limits  URINE CULTURE    EKG None  Radiology CT Renal Stone Study  Result Date: 04/29/2020 CLINICAL DATA:  70 year old male with flank pain. Concern for kidney stone. EXAM:  CT ABDOMEN AND PELVIS WITHOUT CONTRAST TECHNIQUE: Multidetector CT imaging of the abdomen and pelvis was performed following the standard protocol without IV contrast. COMPARISON:  CT abdomen pelvis dated 03/07/2019. FINDINGS: Evaluation of this exam is limited in the absence of intravenous contrast. Lower chest: The visualized lung  bases are clear. Coronary vascular calcifications noted. No intra-abdominal free air or free fluid. Hepatobiliary: The liver is unremarkable. The gallbladder is distended. No calcified gallstone or pericholecystic fluid. Pancreas: Unremarkable. No pancreatic ductal dilatation or surrounding inflammatory changes. Spleen: Normal in size without focal abnormality. Adrenals/Urinary Tract: The adrenal glands are unremarkable. There is a 10 cm right renal cyst. Several nonobstructing stones noted in the mid to lower pole of the right kidney. A 15 mm curvilinear calcification in the interpolar right kidney along the wall of the cyst, new since the prior CT, and likely a nonobstructing stone and less likely focal calcification of the cyst wall. No hydronephrosis. There is a 3 cm left renal inferior pole exophytic cyst. There is no hydronephrosis or nephrolithiasis on the left. The visualized ureters appear unremarkable. The urinary bladder is decompressed around a Foley catheter. Stomach/Bowel: There is moderate amount of stool throughout the colon with a large stool in the rectal vault. There is no bowel obstruction or active inflammation. The appendix is normal. Vascular/Lymphatic: Moderate aortoiliac atherosclerotic disease. The IVC is unremarkable. No portal venous gas. There is no adenopathy. Reproductive: The prostate gland is enlarged measuring 6 cm in transverse axial diameter. The seminal vesicles are symmetric. Other: None Musculoskeletal: Osteopenia with degenerative changes of the spine. L4-S1 posterior fusion. No acute osseous pathology. Old T12 and L1 compression fractures.  IMPRESSION: 1. Nonobstructing right renal stones. No hydronephrosis or obstructing stone. 2. Moderate colonic stool burden. No bowel obstruction. Normal appendix. 3. Aortic Atherosclerosis (ICD10-I70.0). Electronically Signed   By: Anner Crete M.D.   On: 04/29/2020 21:45    Procedures Procedures {Remember to document critical care time when appropriate:1}  Medications Ordered in ED Medications  cefTRIAXone (ROCEPHIN) 1 g in sodium chloride 0.9 % 100 mL IVPB (1 g Intravenous New Bag/Given 04/29/20 2130)  lidocaine (XYLOCAINE) 2 % jelly 1 application (1 application Urethral Given 04/29/20 2115)    ED Course  I have reviewed the triage vital signs and the nursing notes.  Pertinent labs & imaging results that were available during my care of the patient were reviewed by me and considered in my medical decision making (see chart for details).    MDM Rules/Calculators/A&P                          *** Final Clinical Impression(s) / ED Diagnoses Final diagnoses:  None    Rx / DC Orders ED Discharge Orders    None

## 2020-04-30 ENCOUNTER — Encounter (HOSPITAL_COMMUNITY): Payer: Self-pay | Admitting: Internal Medicine

## 2020-04-30 ENCOUNTER — Other Ambulatory Visit: Payer: Self-pay

## 2020-04-30 DIAGNOSIS — R531 Weakness: Secondary | ICD-10-CM

## 2020-04-30 DIAGNOSIS — R339 Retention of urine, unspecified: Secondary | ICD-10-CM | POA: Diagnosis not present

## 2020-04-30 DIAGNOSIS — K5900 Constipation, unspecified: Secondary | ICD-10-CM

## 2020-04-30 DIAGNOSIS — G894 Chronic pain syndrome: Secondary | ICD-10-CM | POA: Diagnosis not present

## 2020-04-30 DIAGNOSIS — K5903 Drug induced constipation: Secondary | ICD-10-CM | POA: Diagnosis not present

## 2020-04-30 DIAGNOSIS — N200 Calculus of kidney: Secondary | ICD-10-CM

## 2020-04-30 DIAGNOSIS — L8990 Pressure ulcer of unspecified site, unspecified stage: Secondary | ICD-10-CM | POA: Insufficient documentation

## 2020-04-30 LAB — CBC
HCT: 41 % (ref 39.0–52.0)
Hemoglobin: 13.4 g/dL (ref 13.0–17.0)
MCH: 29.8 pg (ref 26.0–34.0)
MCHC: 32.7 g/dL (ref 30.0–36.0)
MCV: 91.1 fL (ref 80.0–100.0)
Platelets: 190 10*3/uL (ref 150–400)
RBC: 4.5 MIL/uL (ref 4.22–5.81)
RDW: 12.5 % (ref 11.5–15.5)
WBC: 10 10*3/uL (ref 4.0–10.5)
nRBC: 0 % (ref 0.0–0.2)

## 2020-04-30 LAB — SARS CORONAVIRUS 2 (TAT 6-24 HRS): SARS Coronavirus 2: NEGATIVE

## 2020-04-30 LAB — MAGNESIUM: Magnesium: 2.1 mg/dL (ref 1.7–2.4)

## 2020-04-30 LAB — COMPREHENSIVE METABOLIC PANEL
ALT: 9 U/L (ref 0–44)
AST: 14 U/L — ABNORMAL LOW (ref 15–41)
Albumin: 3.6 g/dL (ref 3.5–5.0)
Alkaline Phosphatase: 85 U/L (ref 38–126)
Anion gap: 10 (ref 5–15)
BUN: 16 mg/dL (ref 8–23)
CO2: 23 mmol/L (ref 22–32)
Calcium: 8.6 mg/dL — ABNORMAL LOW (ref 8.9–10.3)
Chloride: 106 mmol/L (ref 98–111)
Creatinine, Ser: 1.18 mg/dL (ref 0.61–1.24)
GFR, Estimated: >60 mL/min (ref >60)
Glucose, Bld: 101 mg/dL — ABNORMAL HIGH (ref 70–99)
Potassium: 3.6 mmol/L (ref 3.5–5.1)
Sodium: 139 mmol/L (ref 135–145)
Total Bilirubin: 0.9 mg/dL (ref 0.3–1.2)
Total Protein: 6.5 g/dL (ref 6.5–8.1)

## 2020-04-30 LAB — PHOSPHORUS: Phosphorus: 2.5 mg/dL (ref 2.5–4.6)

## 2020-04-30 LAB — HIV ANTIBODY (ROUTINE TESTING W REFLEX): HIV Screen 4th Generation wRfx: NONREACTIVE

## 2020-04-30 MED ORDER — PNEUMOCOCCAL VAC POLYVALENT 25 MCG/0.5ML IJ INJ
0.5000 mL | INJECTION | INTRAMUSCULAR | Status: DC
  Filled 2020-04-30: qty 0.5

## 2020-04-30 MED ORDER — FLEET ENEMA 7-19 GM/118ML RE ENEM
1.0000 | ENEMA | Freq: Every day | RECTAL | Status: DC | PRN
  Administered 2020-04-30: 1 via RECTAL
  Filled 2020-04-30: qty 1

## 2020-04-30 MED ORDER — OXYCODONE HCL 5 MG PO TABS
15.0000 mg | ORAL_TABLET | ORAL | Status: DC | PRN
  Administered 2020-05-01 – 2020-05-02 (×3): 15 mg via ORAL
  Filled 2020-04-30 (×3): qty 3

## 2020-04-30 MED ORDER — POTASSIUM CHLORIDE CRYS ER 20 MEQ PO TBCR
40.0000 meq | EXTENDED_RELEASE_TABLET | Freq: Once | ORAL | Status: AC
  Administered 2020-04-30: 40 meq via ORAL
  Filled 2020-04-30: qty 2

## 2020-04-30 MED ORDER — INFLUENZA VAC A&B SA ADJ QUAD 0.5 ML IM PRSY
0.5000 mL | PREFILLED_SYRINGE | INTRAMUSCULAR | Status: DC
  Filled 2020-04-30: qty 0.5

## 2020-04-30 MED ORDER — CHLORHEXIDINE GLUCONATE CLOTH 2 % EX PADS
6.0000 | MEDICATED_PAD | Freq: Every day | CUTANEOUS | Status: DC
  Administered 2020-04-30 – 2020-05-02 (×3): 6 via TOPICAL

## 2020-04-30 MED ORDER — POLYETHYLENE GLYCOL 3350 17 G PO PACK
17.0000 g | PACK | Freq: Two times a day (BID) | ORAL | Status: DC
  Administered 2020-04-30 – 2020-05-01 (×2): 17 g via ORAL
  Filled 2020-04-30 (×2): qty 1

## 2020-04-30 NOTE — Progress Notes (Signed)
[facility] PROGRESS NOTE   Luke Foster LFY:[id number] DOB: May 04, 1950 DOA: 04/29/2020  PCP: [name], MD  Brief History/Interval Summary: 70 y.o. male, with PMH of hypertension, urine retention, chronic pain, "catastrophic nerve damage" who presented to the ER on 04/29/2020 with urinary retention, fall, generalized weakness.  According to patient's brother patient has had progressive weakness and difficulty walking over the past few weeks.  He had a similar issue last year and needed to go to rehab.  Patient was hospitalized for further management.  Consultants: None  Procedures: None  Antibiotics: Anti-infectives (From admission, onward)   Start     Dose/Rate Route Frequency Ordered Stop   04/29/20 2130  cefTRIAXone (ROCEPHIN) 1 g in sodium chloride 0.9 % 100 mL IVPB        1 g 200 mL/hr over 30 Minutes Intravenous  Once 04/29/20 2116 04/29/20 2208   04/29/20 2030  cefTRIAXone (ROCEPHIN) injection 1 g  Status:  Discontinued        1 g Intramuscular  Once 04/29/20 2027 04/29/20 2116      Subjective/Interval History: Patient slightly distracted but able to answer most of my questions.  Denies any chest pain shortness of breath nausea or vomiting.  Has had lower extremity weakness for several years.  According to the brother patient has had a few falls at home recently as well.    Assessment/Plan:  Generalized weakness This is in the setting of a history of catastrophic nerve damage from trauma several years ago.  Patient uses a walker at baseline.  Has had few falls recently and according to the brother has had a general decline over the past couple of weeks.  Similar to what he had last year and he needed to go to short-term rehab.  No obvious infectious source identified.  UA did not suggest UTI.  Patient did get a dose of ceftriaxone in the ED but has not been continued.  PT and OT evaluation is pending.  Patient will most likely end up requiring rehab based on  history.  He lives by himself.  His family members have been helping care for him but they are not able to provide full support at this time.  Urinary retention Likely due to poor mobility over the past few weeks.  Imaging study of the GU tract does not show any hydronephrosis.  He does have nephrolithiasis but no obstruction noted.  He has had urinary retention in the past.  Continue Foley catheter for now.  Continue Flomax.  Followed by Dr. Gloriann Loan with urology.  He will need to be seen by him in the next week or so in his clinic.  No voiding trial till his mobility has significantly improved.  Constipation Significant stool burden noted on CT scan.  Patient mentions that he has a longstanding history of constipation.  He takes oxycodone at home for his chronic pain.  We will place him on scheduled laxatives.  May need enemas.  Check TSH and free T4 in the morning.  Acute kidney injury Came in with creatinine of 1.4.  Improved this morning.  Likely due to urinary retention.  Monitor urine output.  No further work-up at this time.  Continue to hold ACE inhibitor for 1 more day.  Essential hypertension Diltiazem and lisinopril on hold due to borderline low blood pressures  Chronic pain This is secondary to his nerve injury.  He takes oxycodone at home.  DVT Prophylaxis: Lovenox Code Status: Full code Family Communication:  Discussed with the patient's brother Disposition Plan: May end up requiring short-term rehab at SNF  Status is: Observation  The patient will require care spanning > 2 midnights and should be moved to inpatient because: Unsafe d/c plan  Dispo: The patient is from: Home              Anticipated d/c is to: To be determined              Patient currently is not medically stable to d/c.   Difficult to place patient No       Medications:  Scheduled: . ARIPiprazole  5 mg Oral Daily  . Chlorhexidine Gluconate Cloth  6 each Topical Daily  . cholecalciferol  1,000 Units  Oral Daily  . diltiazem  120 mg Oral QPM  . enoxaparin (LOVENOX) injection  40 mg Subcutaneous Q24H  . [START ON 05/01/2020] influenza vaccine adjuvanted  0.5 mL Intramuscular Tomorrow-1000  . mirabegron ER  50 mg Oral Daily  . [START ON 05/01/2020] pneumococcal 23 valent vaccine  0.5 mL Intramuscular Tomorrow-1000  . senna-docusate  1 tablet Oral BID  . tamsulosin  0.4 mg Oral QPM  . traZODone  50 mg Oral QHS  . venlafaxine XR  75 mg Oral BID  . vitamin B-12  1,000 mcg Oral Daily   Continuous:  PYK:[id number] **OR** acetaminophen, bisacodyl, diazepam, ondansetron **OR** ondansetron (ZOFRAN) IV, oxyCODONE   Objective:  Vital Signs  Vitals:   04/29/20 2230 04/30/20 0012 04/30/20 0414 04/30/20 0820  BP: (!) 132/93 113/79 107/72 112/74  Pulse: (!) 106 (!) 106 88 82  Resp: 15 17 17 16   Temp:  99.5 F (37.5 C) 98.5 F (36.9 C) 98.4 F (36.9 C)  TempSrc:  Oral Oral Oral  SpO2: 100% 97% 97% 96%  Weight:      Height:        Intake/Output Summary (Last 24 hours) at 04/30/2020 0942 Last data filed at 04/30/2020 0400 Gross per 24 hour  Intake 0 ml  Output -  Net 0 ml   Filed Weights   04/29/20 1937  Weight: 83 kg    General appearance: Awake alert.  In no distress Resp: Clear to auscultation bilaterally.  Normal effort Cardio: S1-S2 is normal regular.  No S3-S4.  No rubs murmurs or bruit GI: Abdomen is soft.  Nontender nondistended.  Bowel sounds are present normal.  No masses organomegaly Extremities: No edema.   Neurologic: Alert and oriented x3.  Has chronic lower extremity weakness which is at his baseline per patient.   Lab Results:  Data Reviewed: I have personally reviewed following labs and imaging studies  CBC: Recent Labs  Lab 04/29/20 2015 04/30/20 0644  WBC 10.5 10.0  NEUTROABS 9.7*  --   HGB 15.6 13.4  HCT 48.2 41.0  MCV 90.3 91.1  PLT 195 673    Basic Metabolic Panel: Recent Labs  Lab 04/29/20 2015 04/30/20 0644  NA 138 139  K 4.2 3.6   CL 102 106  CO2 23 23  GLUCOSE 144* 101*  BUN 16 16  CREATININE 1.40* 1.18  CALCIUM 9.2 8.6*  MG  --  2.1  PHOS  --  2.5    GFR: Estimated Creatinine Clearance: 60.9 mL/min (by C-G formula based on SCr of 1.18 mg/dL).  Liver Function Tests: Recent Labs  Lab 04/30/20 0644  AST 14*  ALT 9  ALKPHOS 85  BILITOT 0.9  PROT 6.5  ALBUMIN 3.6      Radiology Studies: CT  Head Wo Contrast  Result Date: 04/29/2020 CLINICAL DATA:  Fall, hit left side of head EXAM: CT HEAD WITHOUT CONTRAST TECHNIQUE: Contiguous axial images were obtained from the base of the skull through the vertex without intravenous contrast. COMPARISON:  03/07/2019 FINDINGS: Brain: There is atrophy and chronic small vessel disease changes. No acute intracranial abnormality. Specifically, no hemorrhage, hydrocephalus, mass lesion, acute infarction, or significant intracranial injury. Vascular: No hyperdense vessel or unexpected calcification. Skull: No acute calvarial abnormality. Sinuses/Orbits: No acute findings Other: None IMPRESSION: Atrophy, chronic microvascular disease. No acute intracranial abnormality. Electronically Signed   By: Rolm Baptise M.D.   On: 04/29/2020 23:36   CT Renal Stone Study  Result Date: 04/29/2020 CLINICAL DATA:  70 year old male with flank pain. Concern for kidney stone. EXAM: CT ABDOMEN AND PELVIS WITHOUT CONTRAST TECHNIQUE: Multidetector CT imaging of the abdomen and pelvis was performed following the standard protocol without IV contrast. COMPARISON:  CT abdomen pelvis dated 03/07/2019. FINDINGS: Evaluation of this exam is limited in the absence of intravenous contrast. Lower chest: The visualized lung bases are clear. Coronary vascular calcifications noted. No intra-abdominal free air or free fluid. Hepatobiliary: The liver is unremarkable. The gallbladder is distended. No calcified gallstone or pericholecystic fluid. Pancreas: Unremarkable. No pancreatic ductal dilatation or surrounding  inflammatory changes. Spleen: Normal in size without focal abnormality. Adrenals/Urinary Tract: The adrenal glands are unremarkable. There is a 10 cm right renal cyst. Several nonobstructing stones noted in the mid to lower pole of the right kidney. A 15 mm curvilinear calcification in the interpolar right kidney along the wall of the cyst, new since the prior CT, and likely a nonobstructing stone and less likely focal calcification of the cyst wall. No hydronephrosis. There is a 3 cm left renal inferior pole exophytic cyst. There is no hydronephrosis or nephrolithiasis on the left. The visualized ureters appear unremarkable. The urinary bladder is decompressed around a Foley catheter. Stomach/Bowel: There is moderate amount of stool throughout the colon with a large stool in the rectal vault. There is no bowel obstruction or active inflammation. The appendix is normal. Vascular/Lymphatic: Moderate aortoiliac atherosclerotic disease. The IVC is unremarkable. No portal venous gas. There is no adenopathy. Reproductive: The prostate gland is enlarged measuring 6 cm in transverse axial diameter. The seminal vesicles are symmetric. Other: None Musculoskeletal: Osteopenia with degenerative changes of the spine. L4-S1 posterior fusion. No acute osseous pathology. Old T12 and L1 compression fractures. IMPRESSION: 1. Nonobstructing right renal stones. No hydronephrosis or obstructing stone. 2. Moderate colonic stool burden. No bowel obstruction. Normal appendix. 3. Aortic Atherosclerosis (ICD10-I70.0). Electronically Signed   By: Anner Crete M.D.   On: 04/29/2020 21:45       LOS: 0 days   [facility] Pager on [email or web address]  04/30/2020, 9:42 AM

## 2020-04-30 NOTE — Evaluation (Signed)
Physical Therapy Evaluation Patient Details Name: Luke Foster MRN: [id number] DOB: [birth year] Today's Date: 04/30/2020   History of Present Illness  70 y.o. male, with PMH of hypertension, urine retention, chronic pain, "catastrophic nerve damage" who presented to the ER on 04/29/2020 with urinary retention, fall, generalized weakness.  Clinical Impression  Pt admitted with above diagnosis. +2 mod assist for stand pivot transfer x 3 trials with RW. Ambulation deferred 2* RLE and low back pain with transfers. ST-SNF recommended.  Pt currently with functional limitations due to the deficits listed below (see PT Problem List). Pt will benefit from skilled PT to increase their independence and safety with mobility to allow discharge to the venue listed below.       Follow Up Recommendations SNF;Supervision/Assistance - 24 hour;Supervision for mobility/OOB    Equipment Recommendations  None recommended by PT    Recommendations for Other Services       Precautions / Restrictions Precautions Precautions: Fall Precaution Comments: 5-6 falls within past 6 months Restrictions Weight Bearing Restrictions: No      Mobility  Bed Mobility Overal bed mobility: Needs Assistance Bed Mobility: Supine to Sit     Supine to sit: Min assist     General bed mobility comments: assist to pivot hips to edge of bed, increased time/effort, pt uses UEs to advance RLE to edge of bed    Transfers Overall transfer level: Needs assistance Equipment used: Rolling walker (2 wheeled) Transfers: Sit to/from [employer] Sit to Stand: +2 physical assistance;Mod assist         General transfer comment: VCs hand placement, assist to power up; SPT x 3 (bed to reliner to bedside commode to recliner), ambulation deferred 2* RLE/back pain and fatigue with mobility  Ambulation/Gait             General Gait Details: unable 2* pain/fatigue  Stairs            Wheelchair Mobility     Modified Rankin (Stroke Patients Only)       Balance Overall balance assessment: Needs assistance Sitting-balance support: Feet supported Sitting balance-Leahy Scale: Fair     Standing balance support: Bilateral upper extremity supported Standing balance-Leahy Scale: Poor Standing balance comment: relies on BUE support                             Pertinent Vitals/Pain Pain Assessment: 0-10 Pain Score: 8  Pain Location: RLE and low back Pain Descriptors / Indicators: Grimacing;Aching Pain Intervention(s): Limited activity within patient's tolerance;Monitored during session;Premedicated before session;Repositioned    Home Living Family/patient expects to be discharged to:: Private residence Living Arrangements: Alone Available Help at Discharge: Available PRN/intermittently;Family Type of Home: House Home Access: Ramped entrance     Home Layout: One level Home Equipment: [place] - 2 wheels;Wheelchair - manual;Grab bars - tub/shower;Shower seat - built in;Bedside commode;Other (comment);Hand held shower head (platform under recliner)      Prior Function Level of Independence: Independent with assistive device(s)         Comments: with walker     Hand Dominance        Extremity/Trunk Assessment   Upper Extremity Assessment Upper Extremity Assessment: Defer to OT evaluation    Lower Extremity Assessment Lower Extremity Assessment: RLE deficits/detail;LLE deficits/detail RLE Deficits / Details: R knee ext -2/5 RLE: Unable to fully assess due to pain RLE Sensation: WNL LLE Deficits / Details: pitting edema L foot which is baseline  per pt and brother, L knee ext +3/5 LLE Sensation: WNL    Cervical / Trunk Assessment Cervical / Trunk Assessment: Normal  Communication   Communication: No difficulties  Cognition Arousal/Alertness: Awake/alert Behavior During Therapy: WFL for tasks assessed/performed Overall Cognitive Status: Within Functional  Limits for tasks assessed                                        General Comments      Exercises     Assessment/Plan    PT Assessment Patient needs continued PT services  PT Problem List Decreased strength;Decreased activity tolerance;Decreased balance;Pain;Decreased mobility       PT Treatment Interventions Therapeutic activities;Therapeutic exercise;Gait training;Functional mobility training;Balance training;Patient/family education    PT Goals (Current goals can be found in the Care Plan section)  Acute Rehab PT Goals Patient Stated Goal: decrease pain, be able to walk PT Goal Formulation: With patient/family Time For Goal Achievement: 05/14/20 Potential to Achieve Goals: Fair    Frequency Min 2X/week   Barriers to discharge        Co-evaluation PT/OT/SLP Co-Evaluation/Treatment: Yes Reason for Co-Treatment: Complexity of the patient's impairments (multi-system involvement);For patient/therapist safety PT goals addressed during session: Mobility/safety with mobility;Balance;Proper use of DME         AM-PAC PT "6 Clicks" Mobility  Outcome Measure Help needed turning from your back to your side while in a flat bed without using bedrails?: A Lot Help needed moving from lying on your back to sitting on the side of a flat bed without using bedrails?: A Lot Help needed moving to and from a bed to a chair (including a wheelchair)?: A Lot Help needed standing up from a chair using your arms (e.g., wheelchair or bedside chair)?: A Lot Help needed to walk in hospital room?: Total Help needed climbing 3-5 steps with a railing? : Total 6 Click Score: 10    End of Session Equipment Utilized During Treatment: Gait belt Activity Tolerance: Patient limited by fatigue;Patient limited by pain Patient left: in chair;with call bell/phone within reach;with family/visitor present;with chair alarm set Nurse Communication: Mobility status PT Visit Diagnosis:  Difficulty in walking, not elsewhere classified (R26.2);Pain Pain - Right/Left: Right Pain - part of body: Leg    Time: 0100-7121 PT Time Calculation (min) (ACUTE ONLY): 43 min   Charges:   PT Evaluation $PT Eval Moderate Complexity: 1 Mod PT Treatments $Therapeutic Activity: 8-22 mins       Blondell Reveal Kistler PT 04/30/2020  Acute Rehabilitation Services Pager [phone] Office [phone]

## 2020-04-30 NOTE — Progress Notes (Signed)
Occupational Therapy Evaluation  Patient lives at home alone in single level house with ramp entry. Patient has family in area that provides support, sister does IADLs such as grocery shopping. Typically patient is mod I with self care however brother reports since beginning of Feb patient with increased weakness, less mobile. Currently patient requiring set up-min A for UB ADL, max to total A for LB ADL and mod x2 for stand pivot transfer. Patient first transfer to recliner with rolling walker and mod x2 with cues for sequencing/safety then reports needing to use commode. Patient attempt to have BM however unsuccessful therefore transferred back to recliner. With each transfer patient had kyphotic posture needing cues to push through arms to correct, also with decreased standing tolerance attempting to sit before aligned with chair. Recommend continued acute OT services to maximize patient endurance, strength, balance in order to facilitate D/C to venue listed below.    04/30/20 1300  OT Visit Information  Last OT Received On 04/30/20  Assistance Needed +2  PT/OT/SLP Co-Evaluation/Treatment Yes  Reason for Co-Treatment For patient/therapist safety;To address functional/ADL transfers  OT goals addressed during session ADL's and self-care  History of Present Illness 70 y.o. male, with PMH of hypertension, urine retention, chronic pain, "catastrophic nerve damage" who presented to the ER on 04/29/2020 with urinary retention, fall, generalized weakness.  Precautions  Precautions Fall  Precaution Comments 5-6 falls within past 6 months  Restrictions  Weight Bearing Restrictions No  Home Living  Family/patient expects to be discharged to: Private residence  Living Arrangements Alone  Available Help at Discharge Available PRN/intermittently;Family  Type of [place] entrance  [place] One level  Bathroom Shower/Tub Walk-in shower  Bathroom Toilet Handicapped height   Bathroom Accessibility Yes  How Accessible Accessible via wheelchair  [place] - 2 wheels;Wheelchair - manual;Grab bars - tub/shower;Shower seat - built in;BSC;Other (comment);Hand held shower head (platform under recliner)  Prior Function  Level of Independence Independent with assistive device(s)  Comments with walker, sister assists with IADLs such as groceries. patient does not drive  Communication  Communication No difficulties  Pain Assessment  Pain Assessment Faces  Faces Pain Scale 6  Pain Location RLE and low back  Pain Descriptors / Indicators Grimacing;Aching  Pain Intervention(s) Monitored during session  Cognition  Arousal/Alertness Awake/alert  Behavior During Therapy WFL for tasks assessed/performed  Overall Cognitive Status Within Functional Limits for tasks assessed  Upper Extremity Assessment  Upper Extremity Assessment Generalized weakness  Lower Extremity Assessment  Lower Extremity Assessment Defer to PT evaluation  ADL  Overall ADL's  Needs assistance/impaired  Grooming Set up;Sitting;Bed level  Upper Body Bathing Minimal assistance;Sitting;Bed level  Lower Body Bathing Maximal assistance;Sitting/lateral leans;Bed level  Upper Body Dressing  Minimal assistance;Sitting;Bed level  Lower Body Dressing Total assistance;Sitting/lateral leans;Bed level  Lower Body Dressing Details (indicate cue type and reason) to don socks  Toilet Transfer Moderate assistance;+2 for physical assistance;+2 for safety/equipment;Stand-pivot;Cueing for safety;Cueing for sequencing;RW;BSC  Toilet Transfer Details (indicate cue type and reason) limited standing tolerance, kyphotic posture with cues to push through UEs to correct posture. decreased stability, safety, balance  Toileting- Clothing Manipulation and Hygiene Total assistance;Sit to/from stand  Toileting - Clothing Manipulation Details (indicate cue type and reason) reliant on UEs, poor standing balance and  activity  Functional mobility during ADLs Moderate assistance;+2 for physical assistance;+2 for safety/equipment;Cueing for safety;Cueing for sequencing;Rolling walker  General ADL Comments patient requiring significantly increased assist with BADL due to weakness, impaired  endurance, balance, safety  Bed Mobility  Overal bed mobility Needs Assistance  Bed Mobility Supine to Sit  Supine to sit Min assist  General bed mobility comments assist to pivot hips to edge of bed, increased time/effort, pt uses UEs to advance RLE to edge of bed  Transfers  Overall transfer level Needs assistance  Equipment used Rolling walker (2 wheeled)  Transfers Sit to/from [employer] Transfers  Sit to Stand +2 physical assistance;Mod assist  Stand pivot transfers +2 physical assistance;+2 safety/equipment;Mod assist  General transfer comment please see toilet transfer in ADL section  Balance  Overall balance assessment Needs assistance  Sitting-balance support Feet supported  Sitting balance-Leahy Scale Fair  Standing balance support Bilateral upper extremity supported  Standing balance-Leahy Scale Poor  Standing balance comment relies on BUE support + external support  OT - End of Session  Equipment Utilized During Treatment Gait belt;Rolling walker  Activity Tolerance Patient limited by pain  Patient left in chair;with call bell/phone within reach;with chair alarm set;with family/visitor present  Nurse Communication Mobility status  OT Assessment  OT Recommendation/Assessment Patient needs continued OT Services  OT Visit Diagnosis Unsteadiness on feet (R26.81);Other abnormalities of gait and mobility (R26.89);Muscle weakness (generalized) (M62.81);History of falling (Z91.81);Pain  Pain - Right/Left Right  Pain - part of body Leg;Ankle and joints of foot (back)  OT Problem List Decreased strength;Decreased activity tolerance;Impaired balance (sitting and/or standing);Decreased safety awareness;Pain   OT Plan  OT Frequency (ACUTE ONLY) Min 2X/week  OT Treatment/Interventions (ACUTE ONLY) Self-care/ADL training;Therapeutic exercise;DME and/or AE instruction;Therapeutic activities;Patient/family education;Balance training  AM-PAC OT "6 Clicks" Daily Activity Outcome Measure (Version 2)  Help from another person eating meals? 3  Help from another person taking care of personal grooming? 3  Help from another person toileting, which includes using toliet, bedpan, or urinal? 2  Help from another person bathing (including washing, rinsing, drying)? 2  Help from another person to put on and taking off regular upper body clothing? 3  Help from another person to put on and taking off regular lower body clothing? 1  6 Click Score 14  OT Recommendation  Follow Up Recommendations SNF;Other (comment) (vs HH with 24/7 assistance if family declines rehab)  OT Equipment None recommended by OT  Individuals Consulted  Consulted and Agree with Results and Recommendations Patient  Acute Rehab OT Goals  Patient Stated Goal decrease pain, be able to walk  OT Goal Formulation With patient  Time For Goal Achievement 05/14/20  Potential to Achieve Goals Good  OT Time Calculation  OT Start Time (ACUTE ONLY) 1035  OT Stop Time (ACUTE ONLY) 1119  OT Time Calculation (min) 44 min  OT General Charges  $OT Visit 1 Visit  OT Evaluation  $OT Eval Moderate Complexity 1 Mod  Written Expression  Dominant Hand  (did not specify)   Delbert Phenix OT OT pager: [phone]

## 2020-05-01 DIAGNOSIS — R338 Other retention of urine: Secondary | ICD-10-CM

## 2020-05-01 DIAGNOSIS — R531 Weakness: Secondary | ICD-10-CM | POA: Diagnosis not present

## 2020-05-01 LAB — BASIC METABOLIC PANEL
Anion gap: 9 (ref 5–15)
BUN: 14 mg/dL (ref 8–23)
CO2: 24 mmol/L (ref 22–32)
Calcium: 8.8 mg/dL — ABNORMAL LOW (ref 8.9–10.3)
Chloride: 108 mmol/L (ref 98–111)
Creatinine, Ser: 1.15 mg/dL (ref 0.61–1.24)
GFR, Estimated: >60 mL/min (ref >60)
Glucose, Bld: 98 mg/dL (ref 70–99)
Potassium: 3.8 mmol/L (ref 3.5–5.1)
Sodium: 141 mmol/L (ref 135–145)

## 2020-05-01 LAB — URINE CULTURE
Culture: NO GROWTH
Special Requests: NORMAL

## 2020-05-01 LAB — TSH: TSH: 0.427 u[IU]/mL (ref 0.350–4.500)

## 2020-05-01 LAB — T4, FREE: Free T4: 1.02 ng/dL (ref 0.61–1.12)

## 2020-05-01 MED ORDER — BISACODYL 10 MG RE SUPP
10.0000 mg | Freq: Once | RECTAL | Status: AC
  Administered 2020-05-01: 10 mg via RECTAL
  Filled 2020-05-01: qty 1

## 2020-05-01 MED ORDER — SORBITOL 70 % SOLN
960.0000 mL | TOPICAL_OIL | Freq: Once | ORAL | Status: AC
  Administered 2020-05-01: 960 mL via RECTAL
  Filled 2020-05-01: qty 473

## 2020-05-01 NOTE — TOC Initial Note (Signed)
Transition of Care [facility]) - Initial/Assessment Note    Patient Details  Name: Luke Foster MRN: [id number] Date of Birth: [birth year]  Transition of [facility]) CM/SW Contact:    Lynnell Catalan, RN Phone Number: 05/01/2020, 3:13 PM  Clinical Narrative:                 Spoke with pt at bedside for dc planning. Pt agrees that he needs short term SNF for rehab. FL2 faxed out to area facilities. Pt gave permission for his brother Konrad Dolores to help him with disposition planning. [name] was emailed SNF bed offers this afternoon. Awaiting SNF bed choice. TOC will continue to follow.  Expected Discharge Plan: Skilled Nursing Facility Barriers to Discharge: Continued Medical Work up   Patient Goals and CMS Choice Patient states their goals for this hospitalization and ongoing recovery are:: To get better      Expected Discharge Plan and Services Expected Discharge Plan: [place]   Discharge Planning Services: CM Consult   Living arrangements for the past 2 months: Single Family Home                                      Prior Living Arrangements/Services Living arrangements for the past 2 months: Single Family Home Lives with:: Self Patient language and need for interpreter reviewed:: Yes        Need for Family Participation in Patient Care: Yes (Comment) Care giver support system in place?: Yes (comment)   Criminal Activity/Legal Involvement Pertinent to Current Situation/Hospitalization: No - Comment as needed  Activities of Daily Living Home Assistive Devices/Equipment: Walker (specify type) ADL Screening (condition at time of admission) Patient's cognitive ability adequate to safely complete daily activities?: Yes Is the patient deaf or have difficulty hearing?: No Does the patient have difficulty seeing, even when wearing glasses/contacts?: Yes Does the patient have difficulty concentrating, remembering, or making decisions?: No Patient able to express  need for assistance with ADLs?: Yes Does the patient have difficulty dressing or bathing?: No Independently performs ADLs?: Yes (appropriate for developmental age) Does the patient have difficulty walking or climbing stairs?: No Weakness of Legs: Both Weakness of Arms/Hands: None  Permission Sought/Granted                  Emotional Assessment Appearance:: Appears older than stated age Attitude/Demeanor/Rapport: Gracious Affect (typically observed): Calm Orientation: : Oriented to Self,Oriented to Place,Oriented to  Time,Oriented to Situation Alcohol / Substance Use: Not Applicable    Admission diagnosis:  Urinary retention [R33.9] Patient Active Problem List   Diagnosis Date Noted  . Generalized weakness 04/30/2020  . Constipation 04/30/2020  . Nephrolithiasis 04/30/2020  . Pressure injury of skin 04/30/2020  . Acute urinary retention 04/29/2020  . Pain in limb 05/09/2019  . Acute metabolic encephalopathy 03/55/9741  . Bilateral hydronephrosis 03/07/2019  . Altered mental status 03/07/2019  . Hypertension   . Abnormal ECG 02/28/2019  . Atherosclerosis of abdominal aorta ([place]) 02/28/2019  . Benign essential HTN 02/28/2019  . Hyperlipidemia, mixed 02/28/2019  . Neuropathy 01/21/2017  . Pain in right foot 01/21/2017  . Pain in left foot 01/21/2017  . Chronic pain syndrome 01/21/2017  . Post laminectomy syndrome 01/21/2017  . Status post insertion of spinal cord stimulator 01/21/2017  . Uncomplicated opioid dependence ([place]) 01/21/2017  . Chronic bilateral low back pain without sciatica 11/27/2015  . Degenerative disc disease, lumbar 06/17/2015  .  Encounter for therapeutic drug monitoring 06/17/2015  . Nervous system device, implant, or graft complication, initial encounter 04/06/2014  . Lumbosacral radiculopathy 12/09/2012   PCP:  [name], MD Pharmacy:   [place], [place] - [address] [place] [address] Phone: [phone] Fax: [phone]  [id number] - [place], [place] - [address] [facility] [address] [facility] [place] [address] [place] [address] Phone: [phone] Fax: [phone]     Social Determinants of Health (SDOH) Interventions    Readmission Risk Interventions No flowsheet data found.

## 2020-05-01 NOTE — NC FL2 (Signed)
[place] LEVEL OF CARE SCREENING TOOL     IDENTIFICATION  Patient Name: Luke Foster Birthdate: January 06, 1951 Sex: male Admission Date (Current Location): 04/29/2020  [facility] [place] Number:  Herbalist and Address:  [facility],  [place], [place]      Provider Number: [id number]  Attending Physician Name and Address:  Georgette Shell, MD  Relative Name and Phone Number:       Current Level of Care: Hospital Recommended Level of Care: [place] Prior Approval Number:    Date Approved/Denied:   PASRR Number: [id number]  Discharge Plan: SNF    Current Diagnoses: Patient Active Problem List   Diagnosis Date Noted  . Generalized weakness 04/30/2020  . Constipation 04/30/2020  . Nephrolithiasis 04/30/2020  . Pressure injury of skin 04/30/2020  . Urinary retention 04/29/2020  . Pain in limb 05/09/2019  . Acute metabolic encephalopathy 81/02/7508  . Bilateral hydronephrosis 03/07/2019  . Altered mental status 03/07/2019  . Hypertension   . Abnormal ECG 02/28/2019  . Atherosclerosis of abdominal aorta ([place]) 02/28/2019  . Benign essential HTN 02/28/2019  . Hyperlipidemia, mixed 02/28/2019  . Neuropathy 01/21/2017  . Pain in right foot 01/21/2017  . Pain in left foot 01/21/2017  . Chronic pain syndrome 01/21/2017  . Post laminectomy syndrome 01/21/2017  . Status post insertion of spinal cord stimulator 01/21/2017  . Uncomplicated opioid dependence ([place]) 01/21/2017  . Chronic bilateral low back pain without sciatica 11/27/2015  . Degenerative disc disease, lumbar 06/17/2015  . Encounter for therapeutic drug monitoring 06/17/2015  . Nervous system device, implant, or graft complication, initial encounter 04/06/2014  . Lumbosacral radiculopathy 12/09/2012    Orientation RESPIRATION BLADDER Height & Weight     Self,Time,Situation,Place  Normal Incontinent,Indwelling catheter (In and out  caths self) Weight: 83 kg Height:  5' 9.5" (176.5 cm)  BEHAVIORAL SYMPTOMS/MOOD NEUROLOGICAL BOWEL NUTRITION STATUS      Incontinent Diet (Regular)  AMBULATORY STATUS COMMUNICATION OF NEEDS Skin   Extensive Assist Verbally Skin abrasions                       Personal Care Assistance Level of Assistance  Bathing,Feeding,Dressing Bathing Assistance: Limited assistance Feeding assistance: Limited assistance Dressing Assistance: Limited assistance     Functional Limitations Info  Sight,Hearing,Speech Sight Info: Impaired (Wears glasses) Hearing Info: Adequate Speech Info: Adequate    SPECIAL CARE FACTORS FREQUENCY  PT (By licensed PT),OT (By licensed OT)     PT Frequency: 5 x weekly OT Frequency: 5 x weekly            Contractures Contractures Info: Not present    Additional Factors Info  Code Status,Allergies Code Status Info: Full Allergies Info: Erythromycin           Current Medications (05/01/2020):  This is the current hospital active medication list Current Facility-Administered Medications  Medication Dose Route Frequency Provider Last Rate Last Admin  . acetaminophen (TYLENOL) tablet 650 mg  650 mg Oral Q6H PRN Doran Heater, DO       Or  . acetaminophen (TYLENOL) suppository 650 mg  650 mg Rectal Q6H PRN MacNeil, Richard G, DO      . ARIPiprazole (ABILIFY) tablet 5 mg  5 mg Oral Daily MacNeil, Richard G, DO   5 mg at 05/01/20 2585  . bisacodyl (DULCOLAX) suppository 10 mg  10 mg Rectal Daily PRN Doran Heater, DO   10 mg  at 04/30/20 1202  . Chlorhexidine Gluconate Cloth 2 % PADS 6 each  6 each Topical Daily Bonnielee Haff, MD   6 each at 04/30/20 1202  . cholecalciferol (VITAMIN D3) tablet 1,000 Units  1,000 Units Oral Daily Doran Heater, DO   1,000 Units at 05/01/20 8882  . diazepam (VALIUM) tablet 10 mg  10 mg Oral BID PRN Doran Heater, DO      . diltiazem (CARDIZEM CD) 24 hr capsule 120 mg  120 mg Oral QPM MacNeil, Richard G,  DO   120 mg at 04/30/20 2200  . enoxaparin (LOVENOX) injection 40 mg  40 mg Subcutaneous Q24H Doran Heater, DO   40 mg at 05/01/20 8003  . influenza vaccine adjuvanted (FLUAD) injection 0.5 mL  0.5 mL Intramuscular Tomorrow-1000 MacNeil, Richard G, DO      . mirabegron ER (MYRBETRIQ) tablet 50 mg  50 mg Oral Daily Doran Heater, DO   50 mg at 05/01/20 4917  . ondansetron (ZOFRAN) tablet 4 mg  4 mg Oral Q6H PRN Doran Heater, DO       Or  . ondansetron (ZOFRAN) injection 4 mg  4 mg Intravenous Q6H PRN MacNeil, Richard G, DO      . oxyCODONE (Oxy IR/ROXICODONE) immediate release tablet 15 mg  15 mg Oral Q4H PRN Doran Heater, DO   15 mg at 05/01/20 0851  . pneumococcal 23 valent vaccine (PNEUMOVAX-23) injection 0.5 mL  0.5 mL Intramuscular Tomorrow-1000 MacNeil, Richard G, DO      . polyethylene glycol (MIRALAX / GLYCOLAX) packet 17 g  17 g Oral BID Bonnielee Haff, MD   17 g at 05/01/20 0958  . senna-docusate (Senokot-S) tablet 1 tablet  1 tablet Oral BID Doran Heater, DO   1 tablet at 05/01/20 9150  . sodium phosphate (FLEET) 7-19 GM/118ML enema 1 enema  1 enema Rectal Daily PRN Bonnielee Haff, MD   1 enema at 04/30/20 1201  . tamsulosin (FLOMAX) capsule 0.4 mg  0.4 mg Oral QPM MacNeil, Richard G, DO   0.4 mg at 04/30/20 1900  . traZODone (DESYREL) tablet 50 mg  50 mg Oral QHS Doran Heater, DO   50 mg at 04/30/20 2121  . venlafaxine XR (EFFEXOR-XR) 24 hr capsule 75 mg  75 mg Oral BID Doran Heater, DO   75 mg at 05/01/20 5697  . vitamin B-12 (CYANOCOBALAMIN) tablet 1,000 mcg  1,000 mcg Oral Daily Doran Heater, DO   1,000 mcg at 05/01/20 9480     Discharge Medications: Please see discharge summary for a list of discharge medications.  Relevant Imaging Results:  Relevant Lab Results:   Additional Information SS# [id number]  Jamil Armwood, Marjie Skiff, RN

## 2020-05-01 NOTE — Progress Notes (Signed)
PROGRESS NOTE    Luke Foster  UVO:[id number] DOB: Nov 12, 1950 DOA: 04/29/2020 PCP: [name], MD  Brief Narrative: 70 year old male with history of BPH, catastrophic nerve damage on narcotics the patient admitted with acute urinary retention.  He is found to have heavy stool burden.  He lives alone.  He had Foley catheter placed in the ED.  He is on Flomax.  Due to generalized weakness urinary retention patient was admitted for observation.  He also had CT of head as he had a fall prior to admission and hit his head.  CT head did not reveal any acute findings.  CT of the abdomen showed nonobstructing nephrolithiasis, no hydronephrosis moderate stool burden.  CT of the head no acute findings. In the ED patient was given Rocephin lidocaine patch narcotics and the Foley catheter was placed.  Assessment & Plan:   Active Problems:   Chronic pain syndrome   Uncomplicated opioid dependence ([place])   Urinary retention   Generalized weakness   Constipation   Nephrolithiasis   Pressure injury of skin   #1  Failure to thrive/generalized weakness-patient with history of catastrophic nerve damage on chronic narcotics admitted with acute urinary retention which was thought to be multifactorial due to moderate constipation limited mobility history of BPH and being on chronic narcotics. He was seen by PT and OT and recommending SNF.  Patient lives alone and is not able to care for himself.  He had a previous episode similar to this time admitted to the hospital with the same complaints. TOC aware.  #2 constipation Dulcolax suppository smog enema MiraLAX Senokot.  #3 urinary retention Foley placed in the ED.  Continue Flomax.  He will need to follow-up with urology as an outpatient.  #4 AKI resolved Baseline creatinine is around 1.  On admission his creatinine was 1.4.  #5 chronic pain unfortunately he is in severe pain that he needs to take narcotics daily for pain control.  However this is  causing constipation and possibly contributing to urinary retention.  We will keep him on daily stool softeners and laxatives.  #6 stage II sacral decubitus present on admission.  # 7  Severe deconditioning-patient is an unsafe discharge to home as he is not able to take care of himself, recurrent falls now with a new Foley.  Pressure Injury 04/30/20 Sacrum Medial;Lower Stage 2 -  Partial thickness loss of dermis presenting as a shallow open injury with a red, pink wound bed without slough. (Active)  04/30/20 1000  Location: Sacrum  Location Orientation: Medial;Lower  Staging: Stage 2 -  Partial thickness loss of dermis presenting as a shallow open injury with a red, pink wound bed without slough.  Wound Description (Comments):   Present on Admission: Yes    Estimated body mass index is 26.63 kg/m as calculated from the following:   Height as of this encounter: 5' 9.5" (1.765 m).   Weight as of this encounter: 83 kg.  DVT prophylaxis: Lovenox  code Status: Full code Family Communication:none at bed side  Disposition Plan:  Status is: Observation  Dispo: The patient is from: HOME              Anticipated d/c is to SNF              Patient currently is medically stable to d/c.   Difficult to place patient NA    Consultants: NONE  Procedures:NONE Antimicrobials:NONE  Subjective: Resting in bed weak and frail Seen by PTOT rec snf  Lives alone Foley in place  Objective: Vitals:   04/30/20 0820 04/30/20 1418 04/30/20 2147 05/01/20 0437  BP: 112/74 132/82 131/78 125/81  Pulse: 82 74 67 67  Resp: 16 18 16 16   Temp: 98.4 F (36.9 C) 97.8 F (36.6 C) 98.4 F (36.9 C) 98.1 F (36.7 C)  TempSrc: Oral Oral Oral Oral  SpO2: 96% 97% 100% 98%  Weight:      Height:        Intake/Output Summary (Last 24 hours) at 05/01/2020 1027 Last data filed at 05/01/2020 0458 Gross per 24 hour  Intake 240 ml  Output 1400 ml  Net -1160 ml   Filed Weights   04/29/20 1937  Weight: 83  kg    Examination:  General exam: Appears calm and comfortable  Respiratory system: Clear to auscultation. Respiratory effort normal. Cardiovascular system: S1 & S2 heard, RRR. No JVD, murmurs, rubs, gallops or clicks. No pedal edema. Gastrointestinal system: Abdomen is nondistended, soft and nontender. No organomegaly or masses felt. Normal bowel sounds heard. Central nervous system: Alert and oriented. No focal neurological deficits. Extremities: Symmetric 5 x 5 power. Skin: No rashes, lesions or ulcers Psychiatry: Judgement and insight appear normal. Mood & affect appropriate.     Data Reviewed: I have personally reviewed following labs and imaging studies  CBC: Recent Labs  Lab 04/29/20 2015 04/30/20 0644  WBC 10.5 10.0  NEUTROABS 9.7*  --   HGB 15.6 13.4  HCT 48.2 41.0  MCV 90.3 91.1  PLT 195 119   Basic Metabolic Panel: Recent Labs  Lab 04/29/20 2015 04/30/20 0644 05/01/20 0531  NA 138 139 141  K 4.2 3.6 3.8  CL 102 106 108  CO2 23 23 24   GLUCOSE 144* 101* 98  BUN 16 16 14   CREATININE 1.40* 1.18 1.15  CALCIUM 9.2 8.6* 8.8*  MG  --  2.1  --   PHOS  --  2.5  --    GFR: Estimated Creatinine Clearance: 62.5 mL/min (by C-G formula based on SCr of 1.15 mg/dL). Liver Function Tests: Recent Labs  Lab 04/30/20 0644  AST 14*  ALT 9  ALKPHOS 85  BILITOT 0.9  PROT 6.5  ALBUMIN 3.6   No results for input(s): LIPASE, AMYLASE in the last 168 hours. No results for input(s): AMMONIA in the last 168 hours. Coagulation Profile: No results for input(s): INR, PROTIME in the last 168 hours. Cardiac Enzymes: No results for input(s): CKTOTAL, CKMB, CKMBINDEX, TROPONINI in the last 168 hours. BNP (last 3 results) No results for input(s): PROBNP in the last 8760 hours. HbA1C: No results for input(s): HGBA1C in the last 72 hours. CBG: No results for input(s): GLUCAP in the last 168 hours. Lipid Profile: No results for input(s): CHOL, HDL, LDLCALC, TRIG, CHOLHDL,  LDLDIRECT in the last 72 hours. Thyroid Function Tests: Recent Labs    05/01/20 0531  TSH 0.427  FREET4 1.02   Anemia Panel: No results for input(s): VITAMINB12, FOLATE, FERRITIN, TIBC, IRON, RETICCTPCT in the last 72 hours. Sepsis Labs: No results for input(s): PROCALCITON, LATICACIDVEN in the last 168 hours.  Recent Results (from the past 240 hour(s))  Urine Culture     Status: None   Collection Time: 04/29/20  8:28 PM   Specimen: Urine, Catheterized  Result Value Ref Range Status   Specimen Description   Final    URINE, CATHETERIZED Performed at [facility] [address] [place], [place] [address]    Special Requests   Final  Normal Performed at [facility], [place] [address], [place], [place] [address]    Culture   Final    NO GROWTH Performed at [facility], [place] [address] [place], [place] [address]    Report Status 05/01/2020 FINAL  Final  SARS CORONAVIRUS 2 (TAT 6-24 HRS) Nasopharyngeal Nasopharyngeal Swab     Status: None   Collection Time: 04/30/20  4:46 AM   Specimen: Nasopharyngeal Swab  Result Value Ref Range Status   SARS Coronavirus 2 NEGATIVE NEGATIVE Final    Comment: (NOTE) SARS-CoV-2 target nucleic acids are NOT DETECTED.  The SARS-CoV-2 RNA is generally detectable in upper and lower respiratory specimens during the acute phase of infection. Negative results do not preclude SARS-CoV-2 infection, do not rule out co-infections with other pathogens, and should not be used as the sole basis for treatment or other patient management decisions. Negative results must be combined with clinical observations, patient history, and epidemiological information. The expected result is Negative.  Fact Sheet for Patients: SugarRoll.be  Fact Sheet for Healthcare Providers: https://www.woods-.com/  This test is not yet approved or cleared by the [place] FDA and   has been authorized for detection and/or diagnosis of SARS-CoV-2 by FDA under an Emergency Use Authorization (EUA). This EUA will remain  in effect (meaning this test can be used) for the duration of the COVID-19 declaration under Se ction 564(b)(1) of the Act, 21 U.S.C. section 360bbb-3(b)(1), unless the authorization is terminated or revoked sooner.  Performed at [facility], [place] [address] [place], [place] [address]          Radiology Studies: CT Head Wo Contrast  Result Date: 04/29/2020 CLINICAL DATA:  Fall, hit left side of head EXAM: CT HEAD WITHOUT CONTRAST TECHNIQUE: Contiguous axial images were obtained from the base of the skull through the vertex without intravenous contrast. COMPARISON:  03/07/2019 FINDINGS: Brain: There is atrophy and chronic small vessel disease changes. No acute intracranial abnormality. Specifically, no hemorrhage, hydrocephalus, mass lesion, acute infarction, or significant intracranial injury. Vascular: No hyperdense vessel or unexpected calcification. Skull: No acute calvarial abnormality. Sinuses/Orbits: No acute findings Other: None IMPRESSION: Atrophy, chronic microvascular disease. No acute intracranial abnormality. Electronically Signed   By: Rolm Baptise M.D.   On: 04/29/2020 23:36   CT Renal Stone Study  Result Date: 04/29/2020 CLINICAL DATA:  70 year old male with flank pain. Concern for kidney stone. EXAM: CT ABDOMEN AND PELVIS WITHOUT CONTRAST TECHNIQUE: Multidetector CT imaging of the abdomen and pelvis was performed following the standard protocol without IV contrast. COMPARISON:  CT abdomen pelvis dated 03/07/2019. FINDINGS: Evaluation of this exam is limited in the absence of intravenous contrast. Lower chest: The visualized lung bases are clear. Coronary vascular calcifications noted. No intra-abdominal free air or free fluid. Hepatobiliary: The liver is unremarkable. The gallbladder is distended. No calcified gallstone or  pericholecystic fluid. Pancreas: Unremarkable. No pancreatic ductal dilatation or surrounding inflammatory changes. Spleen: Normal in size without focal abnormality. Adrenals/Urinary Tract: The adrenal glands are unremarkable. There is a 10 cm right renal cyst. Several nonobstructing stones noted in the mid to lower pole of the right kidney. A 15 mm curvilinear calcification in the interpolar right kidney along the wall of the cyst, new since the prior CT, and likely a nonobstructing stone and less likely focal calcification of the cyst wall. No hydronephrosis. There is a 3 cm left renal inferior pole exophytic cyst. There is no hydronephrosis or nephrolithiasis on the left. The visualized  ureters appear unremarkable. The urinary bladder is decompressed around a Foley catheter. Stomach/Bowel: There is moderate amount of stool throughout the colon with a large stool in the rectal vault. There is no bowel obstruction or active inflammation. The appendix is normal. Vascular/Lymphatic: Moderate aortoiliac atherosclerotic disease. The IVC is unremarkable. No portal venous gas. There is no adenopathy. Reproductive: The prostate gland is enlarged measuring 6 cm in transverse axial diameter. The seminal vesicles are symmetric. Other: None Musculoskeletal: Osteopenia with degenerative changes of the spine. L4-S1 posterior fusion. No acute osseous pathology. Old T12 and L1 compression fractures. IMPRESSION: 1. Nonobstructing right renal stones. No hydronephrosis or obstructing stone. 2. Moderate colonic stool burden. No bowel obstruction. Normal appendix. 3. Aortic Atherosclerosis (ICD10-I70.0). Electronically Signed   By: Anner Crete M.D.   On: 04/29/2020 21:45        Scheduled Meds: . ARIPiprazole  5 mg Oral Daily  . Chlorhexidine Gluconate Cloth  6 each Topical Daily  . cholecalciferol  1,000 Units Oral Daily  . diltiazem  120 mg Oral QPM  . enoxaparin (LOVENOX) injection  40 mg Subcutaneous Q24H  .  influenza vaccine adjuvanted  0.5 mL Intramuscular Tomorrow-1000  . mirabegron ER  50 mg Oral Daily  . pneumococcal 23 valent vaccine  0.5 mL Intramuscular Tomorrow-1000  . polyethylene glycol  17 g Oral BID  . senna-docusate  1 tablet Oral BID  . tamsulosin  0.4 mg Oral QPM  . traZODone  50 mg Oral QHS  . venlafaxine XR  75 mg Oral BID  . vitamin B-12  1,000 mcg Oral Daily   Continuous Infusions:   LOS: 0 days    Georgette Shell, MD 05/01/2020, 10:27 AM

## 2020-05-02 ENCOUNTER — Other Ambulatory Visit: Payer: Self-pay | Admitting: Adult Health

## 2020-05-02 DIAGNOSIS — R279 Unspecified lack of coordination: Secondary | ICD-10-CM | POA: Diagnosis not present

## 2020-05-02 DIAGNOSIS — M545 Low back pain, unspecified: Secondary | ICD-10-CM | POA: Diagnosis not present

## 2020-05-02 DIAGNOSIS — R310 Gross hematuria: Secondary | ICD-10-CM | POA: Diagnosis not present

## 2020-05-02 DIAGNOSIS — N401 Enlarged prostate with lower urinary tract symptoms: Secondary | ICD-10-CM | POA: Diagnosis not present

## 2020-05-02 DIAGNOSIS — M6281 Muscle weakness (generalized): Secondary | ICD-10-CM | POA: Diagnosis not present

## 2020-05-02 DIAGNOSIS — R2681 Unsteadiness on feet: Secondary | ICD-10-CM | POA: Diagnosis not present

## 2020-05-02 DIAGNOSIS — J069 Acute upper respiratory infection, unspecified: Secondary | ICD-10-CM | POA: Diagnosis not present

## 2020-05-02 DIAGNOSIS — R2689 Other abnormalities of gait and mobility: Secondary | ICD-10-CM | POA: Diagnosis not present

## 2020-05-02 DIAGNOSIS — G629 Polyneuropathy, unspecified: Secondary | ICD-10-CM | POA: Diagnosis not present

## 2020-05-02 DIAGNOSIS — Z79899 Other long term (current) drug therapy: Secondary | ICD-10-CM | POA: Diagnosis not present

## 2020-05-02 DIAGNOSIS — Z466 Encounter for fitting and adjustment of urinary device: Secondary | ICD-10-CM | POA: Diagnosis not present

## 2020-05-02 DIAGNOSIS — N200 Calculus of kidney: Secondary | ICD-10-CM | POA: Diagnosis not present

## 2020-05-02 DIAGNOSIS — N3289 Other specified disorders of bladder: Secondary | ICD-10-CM | POA: Diagnosis not present

## 2020-05-02 DIAGNOSIS — Z9181 History of falling: Secondary | ICD-10-CM | POA: Diagnosis not present

## 2020-05-02 DIAGNOSIS — Z20822 Contact with and (suspected) exposure to covid-19: Secondary | ICD-10-CM | POA: Diagnosis not present

## 2020-05-02 DIAGNOSIS — N179 Acute kidney failure, unspecified: Secondary | ICD-10-CM | POA: Diagnosis not present

## 2020-05-02 DIAGNOSIS — I700 Atherosclerosis of aorta: Secondary | ICD-10-CM | POA: Diagnosis not present

## 2020-05-02 DIAGNOSIS — R338 Other retention of urine: Secondary | ICD-10-CM | POA: Diagnosis not present

## 2020-05-02 DIAGNOSIS — M5136 Other intervertebral disc degeneration, lumbar region: Secondary | ICD-10-CM | POA: Diagnosis not present

## 2020-05-02 DIAGNOSIS — R339 Retention of urine, unspecified: Secondary | ICD-10-CM | POA: Diagnosis not present

## 2020-05-02 DIAGNOSIS — L89152 Pressure ulcer of sacral region, stage 2: Secondary | ICD-10-CM | POA: Diagnosis not present

## 2020-05-02 DIAGNOSIS — G4700 Insomnia, unspecified: Secondary | ICD-10-CM | POA: Diagnosis not present

## 2020-05-02 DIAGNOSIS — K5903 Drug induced constipation: Secondary | ICD-10-CM | POA: Diagnosis not present

## 2020-05-02 DIAGNOSIS — Z741 Need for assistance with personal care: Secondary | ICD-10-CM | POA: Diagnosis not present

## 2020-05-02 DIAGNOSIS — F5101 Primary insomnia: Secondary | ICD-10-CM | POA: Diagnosis not present

## 2020-05-02 DIAGNOSIS — R531 Weakness: Secondary | ICD-10-CM | POA: Diagnosis not present

## 2020-05-02 DIAGNOSIS — I10 Essential (primary) hypertension: Secondary | ICD-10-CM | POA: Diagnosis not present

## 2020-05-02 DIAGNOSIS — R5381 Other malaise: Secondary | ICD-10-CM | POA: Diagnosis not present

## 2020-05-02 DIAGNOSIS — L89151 Pressure ulcer of sacral region, stage 1: Secondary | ICD-10-CM | POA: Diagnosis not present

## 2020-05-02 DIAGNOSIS — K5900 Constipation, unspecified: Secondary | ICD-10-CM | POA: Diagnosis not present

## 2020-05-02 DIAGNOSIS — E782 Mixed hyperlipidemia: Secondary | ICD-10-CM | POA: Diagnosis not present

## 2020-05-02 DIAGNOSIS — R627 Adult failure to thrive: Secondary | ICD-10-CM | POA: Diagnosis not present

## 2020-05-02 DIAGNOSIS — Z743 Need for continuous supervision: Secondary | ICD-10-CM | POA: Diagnosis not present

## 2020-05-02 DIAGNOSIS — G894 Chronic pain syndrome: Secondary | ICD-10-CM | POA: Diagnosis not present

## 2020-05-02 MED ORDER — OXYCODONE HCL 15 MG PO TABS: 15.0000 mg | ORAL_TABLET | ORAL | 0 refills | Status: DC | PRN

## 2020-05-02 MED ORDER — POLYETHYLENE GLYCOL 3350 17 G PO PACK: 17.0000 g | PACK | Freq: Two times a day (BID) | ORAL | 0 refills | Status: DC

## 2020-05-02 MED ORDER — DIAZEPAM 10 MG PO TABS: 10.0000 mg | ORAL_TABLET | Freq: Two times a day (BID) | ORAL | 0 refills | Status: DC | PRN

## 2020-05-02 MED ORDER — SENNOSIDES-DOCUSATE SODIUM 8.6-50 MG PO TABS: 1.0000 | ORAL_TABLET | Freq: Two times a day (BID) | ORAL | Status: DC

## 2020-05-02 NOTE — Progress Notes (Signed)
Occupational Therapy Treatment Patient Details Name: Luke Foster MRN: [id number] DOB: 1950/04/15 Today's Date: 05/02/2020    History of present illness 70 y.o. male, with PMH of hypertension, urine retention, chronic pain, "catastrophic nerve damage" who presented to the ER on 04/29/2020 with urinary retention, fall, generalized weakness.   OT comments  Patient much improved with mobility this session. Patient was premedicated and reporting minimal pain. Able to sit up at edge of bed with min G assist for safety. With bed height elevated and cues for hand placement patient able to power up to standing with min A. Requires increased time static standing with walker to obtain balance. Able to tolerate standing ~30 seconds for perianal care before transferring to recliner with min A. Patient reports feeling much better today than previous session with therapy. Cont with POC.   Follow Up Recommendations  SNF    Equipment Recommendations  None recommended by OT       Precautions / Restrictions Precautions Precautions: Fall Precaution Comments: 5-6 falls within past 6 months Restrictions Weight Bearing Restrictions: No       Mobility Bed Mobility Overal bed mobility: Needs Assistance Bed Mobility: Supine to Sit     Supine to sit: Min guard;HOB elevated     General bed mobility comments: increased time    Transfers Overall transfer level: Needs assistance Equipment used: Rolling walker (2 wheeled) Transfers: Sit to/from Stand Sit to Stand: Min assist;From elevated surface         General transfer comment: please see toilet transfer in ADL section    Balance Overall balance assessment: Needs assistance Sitting-balance support: Feet supported Sitting balance-Leahy Scale: Good     Standing balance support: Bilateral upper extremity supported Standing balance-Leahy Scale: Poor Standing balance comment: relies on BUE support + external support                            ADL either performed or assessed with clinical judgement   ADL Overall ADL's : Needs assistance/impaired     Grooming: Wash/dry face;Set up;Sitting   Upper Body Bathing: Set up;Sitting           Lower Body Dressing: Total assistance;Sitting/lateral leans Lower Body Dressing Details (indicate cue type and reason): to don socks, patient does not wear at baseline, wears slide on shoes Toilet Transfer: Minimal assistance;Ambulation;RW Toilet Transfer Details (indicate cue type and reason): with bed height elevated, increased time and cues for hand placement patient able to stand from EOB. Once steady patient min A to take ~10 steps over to recliner. much improved from previous session Toileting- Clothing Manipulation and Hygiene: Total assistance;Sit to/from stand Toileting - Clothing Manipulation Details (indicate cue type and reason): still reliant on UEs on walker     Functional mobility during ADLs: Minimal assistance;Rolling walker General ADL Comments: much improved activity tolerance and stability this session needing only 1 assist to chair, also with less pain this session               Cognition Arousal/Alertness: Awake/alert Behavior During Therapy: WFL for tasks assessed/performed Overall Cognitive Status: Within Functional Limits for tasks assessed                                                     Pertinent Vitals/ Pain  Pain Assessment: Faces Faces Pain Scale: Hurts a little bit Pain Location: feet when donning socks Pain Descriptors / Indicators: Burning Pain Intervention(s): Premedicated before session         Frequency  Min 2X/week        Progress Toward Goals  OT Goals(current goals can now be found in the care plan section)  Progress towards OT goals: Progressing toward goals  Acute Rehab OT Goals Patient Stated Goal: decrease pain, be able to walk OT Goal Formulation: With patient Time For Goal  Achievement: 05/14/20 Potential to Achieve Goals: Good ADL Goals Pt Will Perform Lower Body Dressing: with min assist;sit to/from stand;sitting/lateral leans (A/E as needed) Pt Will Transfer to Toilet: with min assist;ambulating;bedside commode (walker) Pt Will Perform Toileting - Clothing Manipulation and hygiene: with min assist;sit to/from stand;sitting/lateral leans Additional ADL Goal #1: Patient will tolerate standing with rolling walker for 6 minutes at min A level in order to participate in self care tasks.  Plan Discharge plan remains appropriate       AM-PAC OT "6 Clicks" Daily Activity     Outcome Measure   Help from another person eating meals?: None Help from another person taking care of personal grooming?: A Little Help from another person toileting, which includes using toliet, bedpan, or urinal?: A Lot Help from another person bathing (including washing, rinsing, drying)?: A Lot Help from another person to put on and taking off regular upper body clothing?: A Little Help from another person to put on and taking off regular lower body clothing?: Total 6 Click Score: 15    End of Session Equipment Utilized During Treatment: Gait belt;Rolling walker  OT Visit Diagnosis: Unsteadiness on feet (R26.81);Other abnormalities of gait and mobility (R26.89);Muscle weakness (generalized) (M62.81);History of falling (Z91.81);Pain Pain - Right/Left:  (bilateral) Pain - part of body: Ankle and joints of foot   Activity Tolerance Patient tolerated treatment well   Patient Left in chair;with call bell/phone within reach;with chair alarm set   Nurse Communication Mobility status        Time: 1962-2297 OT Time Calculation (min): 19 min  Charges: OT General Charges $OT Visit: 1 Visit OT Treatments $Self Care/Home Management : 8-22 mins  Delbert Phenix OT OT pager: [phone]   Rosemary Holms 05/02/2020, 11:53 AM

## 2020-05-02 NOTE — Care Management Obs Status (Signed)
[facility] NOTIFICATION   Patient Details  Name: Luke Foster MRN: [id number] Date of Birth: 03-Dec-1950   Medicare Observation Status Notification Given:  Yes    Lynnell Catalan, RN 05/02/2020, 1:34 PM

## 2020-05-02 NOTE — Discharge Summary (Signed)
Physician Discharge Summary  ARVILLE POSTLEWAITE JJH:[id number] DOB: 29-Jun-1950 DOA: 04/29/2020  PCP: [name], MD  Admit date: 04/29/2020 Discharge date: 05/02/2020  Admitted From: Home Disposition: Nursing home Recommendations for Outpatient Follow-up:  1. Follow up with PCP in 1-2 weeks 2. Please obtain BMP/CBC in one week Please follow up with [facility] in 2 weeks Home Health none Equipment/Devices none  Discharge Condition: Stable CODE STATUS full code Diet recommendation cardiac Brief/Interim Summary:70 year old male with history of BPH, catastrophic nerve damage on narcotics the patient admitted with acute urinary retention.  He is found to have heavy stool burden.  He lives alone.  He had Foley catheter placed in the ED.  He is on Flomax.  Due to generalized weakness urinary retention patient was admitted for observation.  He also had CT of head as he had a fall prior to admission and hit his head.  CT head did not reveal any acute findings.  CT of the abdomen showed nonobstructing nephrolithiasis, no hydronephrosis moderate stool burden.  CT of the head no acute findings. In the ED patient was given Rocephin lidocaine patch narcotics and the Foley catheter was placed.  Discharge Diagnoses:  Active Problems:   Chronic pain syndrome   Uncomplicated opioid dependence ([place])   Acute urinary retention   Generalized weakness   Constipation   Nephrolithiasis   Pressure injury of skin  #1  Failure to thrive/generalized weakness-patient with history of catastrophic nerve damage on chronic narcotics admitted with acute urinary retention which was thought to be multifactorial due to moderate constipation limited mobility history of BPH and being on chronic narcotics. He was seen by PT and OT and recommending SNF.  Patient lives alone and is not able to care for himself.  He had a previous episode similar to this time admitted to the hospital with the same complaints.  #2  constipation-we will need to continue MiraLAX Dulcolax and Senokot since he is on chronic narcotics and not very mobile.    #3 urinary retention Foley placed in the ED. continue his home medications including Flomax.  Follow-up with [facility] in 2 weeks.   #4 AKI resolved Baseline creatinine is around 1.  On admission his creatinine was 1.4.  On the day of discharge his creatinine was 1.15.  #5 chronic pain unfortunately he is in severe pain that he needs to take narcotics daily for pain control.  However this is causing constipation and possibly contributing to urinary retention.  Continue daily stool softeners and as needed laxatives.  #6 stage II sacral decubitus present on admission.  # 7  Severe deconditioning-patient is an unsafe discharge to home as he is not able to take care of himself, recurrent falls now with a new Foley.  Pressure Injury 04/30/20 Sacrum Medial;Lower Stage 2 -  Partial thickness loss of dermis presenting as a shallow open injury with a red, pink wound bed without slough. (Active)  04/30/20 1000  Location: Sacrum  Location Orientation: Medial;Lower  Staging: Stage 2 -  Partial thickness loss of dermis presenting as a shallow open injury with a red, pink wound bed without slough.  Wound Description (Comments):   Present on Admission: Yes    Estimated body mass index is 26.63 kg/m as calculated from the following:   Height as of this encounter: 5' 9.5" (1.765 m).   Weight as of this encounter: 83 kg.  Discharge Instructions  Discharge Instructions    Diet - low sodium heart healthy   Complete by:  As directed    Discharge wound care:   Complete by: As directed    Stage 2 sacrum see orders   Increase activity slowly   Complete by: As directed    No wound care   Complete by: As directed      Allergies as of 05/02/2020      Reactions   Erythromycin Itching      Medication List    STOP taking these medications   enalapril 5 MG  tablet Commonly known as: VASOTEC     TAKE these medications   ARIPiprazole 5 MG tablet Commonly known as: ABILIFY Take 1 tablet (5 mg total) by mouth daily.   cholecalciferol 25 MCG (1000 UNIT) tablet Commonly known as: VITAMIN D3 Take 1,000 Units by mouth daily.   diazepam 10 MG tablet Commonly known as: VALIUM Take 1 tablet (10 mg total) by mouth 2 (two) times daily as needed (restless legs). What changed:   reasons to take this  additional instructions   diltiazem 120 MG 24 hr capsule Commonly known as: CARDIZEM CD Take 120 mg by mouth every evening.   docusate sodium 100 MG capsule Commonly known as: COLACE Take 100 mg by mouth in the morning, at noon, and at bedtime.   mirabegron ER 50 MG Tb24 tablet Commonly known as: MYRBETRIQ Take 50 mg by mouth daily.   oxyCODONE 15 MG immediate release tablet Commonly known as: ROXICODONE Take 1 tablet (15 mg total) by mouth every 4 (four) hours as needed for severe pain. What changed:   when to take this  reasons to take this   polyethylene glycol 17 g packet Commonly known as: MIRALAX / GLYCOLAX Take 17 g by mouth 2 (two) times daily.   senna-docusate 8.6-50 MG tablet Commonly known as: Senokot-S Take 1 tablet by mouth 2 (two) times daily.   tamsulosin 0.4 MG Caps capsule Commonly known as: FLOMAX Take 0.4 mg by mouth every evening.   tolterodine 1 MG tablet Commonly known as: DETROL Take 1 mg by mouth 2 (two) times daily.   traZODone 50 MG tablet Commonly known as: DESYREL Take 1 tablet (50 mg total) by mouth at bedtime.   venlafaxine XR 75 MG 24 hr capsule Commonly known as: EFFEXOR-XR Take 75 mg by mouth in the morning and at bedtime.   vitamin B-12 500 MCG tablet Commonly known as: CYANOCOBALAMIN Take 1 tablet (500 mcg total) by mouth daily. What changed:   how much to take  when to take this            Discharge Care Instructions  (From admission, onward)         Start     Ordered    05/02/20 0000  Discharge wound care:       Comments: Stage 2 sacrum see orders   05/02/20 6295          Contact information for follow-up providers    [name], MD Follow up.   Specialty: Internal Medicine Contact information: [address] [place] [address] [phone]        [facility] Follow up.   Contact information: [place] [phone]           Contact information for after-discharge care    Destination    [facility] Preferred SNF .   Service: Skilled Nursing Contact information: [address] [place] [phone]  Allergies  Allergen Reactions  . Erythromycin Itching    Consultations: None  Procedures/Studies: CT Head Wo Contrast  Result Date: 04/29/2020 CLINICAL DATA:  Fall, hit left side of head EXAM: CT HEAD WITHOUT CONTRAST TECHNIQUE: Contiguous axial images were obtained from the base of the skull through the vertex without intravenous contrast. COMPARISON:  03/07/2019 FINDINGS: Brain: There is atrophy and chronic small vessel disease changes. No acute intracranial abnormality. Specifically, no hemorrhage, hydrocephalus, mass lesion, acute infarction, or significant intracranial injury. Vascular: No hyperdense vessel or unexpected calcification. Skull: No acute calvarial abnormality. Sinuses/Orbits: No acute findings Other: None IMPRESSION: Atrophy, chronic microvascular disease. No acute intracranial abnormality. Electronically Signed   By: Rolm Baptise M.D.   On: 04/29/2020 23:36   CT Renal Stone Study  Result Date: 04/29/2020 CLINICAL DATA:  70 year old male with flank pain. Concern for kidney stone. EXAM: CT ABDOMEN AND PELVIS WITHOUT CONTRAST TECHNIQUE: Multidetector CT imaging of the abdomen and pelvis was performed following the standard protocol without IV contrast. COMPARISON:  CT abdomen  pelvis dated 03/07/2019. FINDINGS: Evaluation of this exam is limited in the absence of intravenous contrast. Lower chest: The visualized lung bases are clear. Coronary vascular calcifications noted. No intra-abdominal free air or free fluid. Hepatobiliary: The liver is unremarkable. The gallbladder is distended. No calcified gallstone or pericholecystic fluid. Pancreas: Unremarkable. No pancreatic ductal dilatation or surrounding inflammatory changes. Spleen: Normal in size without focal abnormality. Adrenals/Urinary Tract: The adrenal glands are unremarkable. There is a 10 cm right renal cyst. Several nonobstructing stones noted in the mid to lower pole of the right kidney. A 15 mm curvilinear calcification in the interpolar right kidney along the wall of the cyst, new since the prior CT, and likely a nonobstructing stone and less likely focal calcification of the cyst wall. No hydronephrosis. There is a 3 cm left renal inferior pole exophytic cyst. There is no hydronephrosis or nephrolithiasis on the left. The visualized ureters appear unremarkable. The urinary bladder is decompressed around a Foley catheter. Stomach/Bowel: There is moderate amount of stool throughout the colon with a large stool in the rectal vault. There is no bowel obstruction or active inflammation. The appendix is normal. Vascular/Lymphatic: Moderate aortoiliac atherosclerotic disease. The IVC is unremarkable. No portal venous gas. There is no adenopathy. Reproductive: The prostate gland is enlarged measuring 6 cm in transverse axial diameter. The seminal vesicles are symmetric. Other: None Musculoskeletal: Osteopenia with degenerative changes of the spine. L4-S1 posterior fusion. No acute osseous pathology. Old T12 and L1 compression fractures. IMPRESSION: 1. Nonobstructing right renal stones. No hydronephrosis or obstructing stone. 2. Moderate colonic stool burden. No bowel obstruction. Normal appendix. 3. Aortic Atherosclerosis  (ICD10-I70.0). Electronically Signed   By: Anner Crete M.D.   On: 04/29/2020 21:45    (Echo, Carotid, EGD, Colonoscopy, ERCP)    Subjective: Patient resting in bed anxious to go to rehab he has no new complaints  Discharge Exam: Vitals:   05/01/20 2042 05/02/20 0522  BP: 107/65 108/66  Pulse: 76 75  Resp: 16 16  Temp: 98 F (36.7 C) 97.9 F (36.6 C)  SpO2: 96% 96%   Vitals:   05/01/20 0437 05/01/20 1349 05/01/20 2042 05/02/20 0522  BP: 125/81 112/65 107/65 108/66  Pulse: 67 72 76 75  Resp: 16 17 16 16   Temp: 98.1 F (36.7 C) 98.4 F (36.9 C) 98 F (36.7 C) 97.9 F (36.6 C)  TempSrc: Oral Oral Oral Oral  SpO2: 98% 98% 96% 96%  Weight:  Height:        General: Pt is alert, awake, not in acute distress Cardiovascular: RRR, S1/S2 +, no rubs, no gallops Respiratory: CTA bilaterally, no wheezing, no rhonchi Abdominal: Soft, NT, ND, bowel sounds + Extremities: no edema, no cyanosis    The results of significant diagnostics from this hospitalization (including imaging, microbiology, ancillary and laboratory) are listed below for reference.     Microbiology: Recent Results (from the past 240 hour(s))  Urine Culture     Status: None   Collection Time: 04/29/20  8:28 PM   Specimen: Urine, Catheterized  Result Value Ref Range Status   Specimen Description   Final    URINE, CATHETERIZED Performed at [facility] [address], [place], [place] [address]    Special Requests   Final    Normal Performed at [facility], [place] [address] [place], [place] [address]    Culture   Final    NO GROWTH Performed at [facility], [place] [address], [place], [place] [address]    Report Status 05/01/2020 FINAL  Final  SARS CORONAVIRUS 2 (TAT 6-24 HRS) Nasopharyngeal Nasopharyngeal Swab     Status: None   Collection Time: 04/30/20  4:46 AM   Specimen: Nasopharyngeal Swab  Result Value Ref Range Status   SARS Coronavirus 2  NEGATIVE NEGATIVE Final    Comment: (NOTE) SARS-CoV-2 target nucleic acids are NOT DETECTED.  The SARS-CoV-2 RNA is generally detectable in upper and lower respiratory specimens during the acute phase of infection. Negative results do not preclude SARS-CoV-2 infection, do not rule out co-infections with other pathogens, and should not be used as the sole basis for treatment or other patient management decisions. Negative results must be combined with clinical observations, patient history, and epidemiological information. The expected result is Negative.  Fact Sheet for Patients: SugarRoll.be  Fact Sheet for Healthcare Providers: https://www.woods-.com/  This test is not yet approved or cleared by the [place] FDA and  has been authorized for detection and/or diagnosis of SARS-CoV-2 by FDA under an Emergency Use Authorization (EUA). This EUA will remain  in effect (meaning this test can be used) for the duration of the COVID-19 declaration under Se ction 564(b)(1) of the Act, 21 U.S.C. section 360bbb-3(b)(1), unless the authorization is terminated or revoked sooner.  Performed at [facility], [place] [address], [place],  [address]      Labs: BNP (last 3 results) No results for input(s): BNP in the last 8760 hours. Basic Metabolic Panel: Recent Labs  Lab 04/29/20 2015 04/30/20 0644 05/01/20 0531  NA 138 139 141  K 4.2 3.6 3.8  CL 102 106 108  CO2 23 23 24   GLUCOSE 144* 101* 98  BUN 16 16 14   CREATININE 1.40* 1.18 1.15  CALCIUM 9.2 8.6* 8.8*  MG  --  2.1  --   PHOS  --  2.5  --    Liver Function Tests: Recent Labs  Lab 04/30/20 0644  AST 14*  ALT 9  ALKPHOS 85  BILITOT 0.9  PROT 6.5  ALBUMIN 3.6   No results for input(s): LIPASE, AMYLASE in the last 168 hours. No results for input(s): AMMONIA in the last 168 hours. CBC: Recent Labs  Lab 04/29/20 2015 04/30/20 0644  WBC 10.5 10.0   NEUTROABS 9.7*  --   HGB 15.6 13.4  HCT 48.2 41.0  MCV 90.3 91.1  PLT 195 190   Cardiac Enzymes: No results for input(s):  CKTOTAL, CKMB, CKMBINDEX, TROPONINI in the last 168 hours. BNP: Invalid input(s): POCBNP CBG: No results for input(s): GLUCAP in the last 168 hours. D-Dimer No results for input(s): DDIMER in the last 72 hours. Hgb A1c No results for input(s): HGBA1C in the last 72 hours. Lipid Profile No results for input(s): CHOL, HDL, LDLCALC, TRIG, CHOLHDL, LDLDIRECT in the last 72 hours. Thyroid function studies Recent Labs    05/01/20 0531  TSH 0.427   Anemia work up No results for input(s): VITAMINB12, FOLATE, FERRITIN, TIBC, IRON, RETICCTPCT in the last 72 hours. Urinalysis    Component Value Date/Time   COLORURINE YELLOW 04/29/2020 2008   APPEARANCEUR HAZY (A) 04/29/2020 2008   LABSPEC 1.014 04/29/2020 2008   PHURINE 5.0 04/29/2020 2008   GLUCOSEU NEGATIVE 04/29/2020 2008   HGBUR SMALL (A) 04/29/2020 2008   BILIRUBINUR NEGATIVE 04/29/2020 2008   KETONESUR 20 (A) 04/29/2020 2008   PROTEINUR NEGATIVE 04/29/2020 2008   UROBILINOGEN 1.0 08/16/2009 1138   NITRITE NEGATIVE 04/29/2020 2008   LEUKOCYTESUR NEGATIVE 04/29/2020 2008   Sepsis Labs Invalid input(s): PROCALCITONIN,  WBC,  LACTICIDVEN Microbiology Recent Results (from the past 240 hour(s))  Urine Culture     Status: None   Collection Time: 04/29/20  8:28 PM   Specimen: Urine, Catheterized  Result Value Ref Range Status   Specimen Description   Final    URINE, CATHETERIZED Performed at [facility], [place] [address] [place], [place] [address]    Special Requests   Final    Normal Performed at [facility], [place] [address] [place], [place] [address]    Culture   Final    NO GROWTH Performed at [facility], [place] [address], [place], [place] [address]    Report Status 05/01/2020 FINAL  Final  SARS CORONAVIRUS 2 (TAT 6-24 HRS) Nasopharyngeal  Nasopharyngeal Swab     Status: None   Collection Time: 04/30/20  4:46 AM   Specimen: Nasopharyngeal Swab  Result Value Ref Range Status   SARS Coronavirus 2 NEGATIVE NEGATIVE Final    Comment: (NOTE) SARS-CoV-2 target nucleic acids are NOT DETECTED.  The SARS-CoV-2 RNA is generally detectable in upper and lower respiratory specimens during the acute phase of infection. Negative results do not preclude SARS-CoV-2 infection, do not rule out co-infections with other pathogens, and should not be used as the sole basis for treatment or other patient management decisions. Negative results must be combined with clinical observations, patient history, and epidemiological information. The expected result is Negative.  Fact Sheet for Patients: SugarRoll.be  Fact Sheet for Healthcare Providers: https://www.woods-[place].com/  This test is not yet approved or cleared by the [place] FDA and  has been authorized for detection and/or diagnosis of SARS-CoV-2 by FDA under an Emergency Use Authorization (EUA). This EUA will remain  in effect (meaning this test can be used) for the duration of the COVID-19 declaration under Se ction 564(b)(1) of the Act, 21 U.S.C. section 360bbb-3(b)(1), unless the authorization is terminated or revoked sooner.  Performed at [facility], [place] [address] [place], [place] [address]      Time coordinating discharge: Over 38 minutes  SIGNED:   Georgette Shell, MD  [facility] 05/02/2020, 9:36 AM

## 2020-05-02 NOTE — Progress Notes (Signed)
Pt discharged to [facility] via [facility]. Report called to Cassandra RN at facility.

## 2020-05-02 NOTE — TOC Transition Note (Signed)
Transition of Care [facility]) - CM/SW Discharge Note   Patient Details  Name: Luke Foster MRN: [id number] Date of Birth: 04-Apr-1950  [facility] CM/SW Contact:  Lynnell Catalan, RN Phone Number: 05/02/2020, 1:30 PM   Clinical Narrative:    Spoke with brother Konrad Dolores for SNF bed choice. [name] and pt choose [facility]. Kitty at [facility] alerted of bed acceptance. [facility] to transport [facility]. RN to call report to [phone].   Final next level of care: Skilled Nursing Facility Barriers to Discharge: Continued Medical Work up   Patient Goals and CMS Choice Patient states their goals for this hospitalization and ongoing recovery are:: To get better      Discharge Plan and Services   Discharge Planning Services: CM Consult              Readmission Risk Interventions No flowsheet data found.

## 2020-05-03 ENCOUNTER — Encounter: Payer: Self-pay | Admitting: Adult Health

## 2020-05-03 ENCOUNTER — Non-Acute Institutional Stay (SKILLED_NURSING_FACILITY): Payer: Medicare Other | Admitting: Adult Health

## 2020-05-03 DIAGNOSIS — G894 Chronic pain syndrome: Secondary | ICD-10-CM | POA: Diagnosis not present

## 2020-05-03 DIAGNOSIS — R531 Weakness: Secondary | ICD-10-CM

## 2020-05-03 DIAGNOSIS — R338 Other retention of urine: Secondary | ICD-10-CM | POA: Diagnosis not present

## 2020-05-03 DIAGNOSIS — K5903 Drug induced constipation: Secondary | ICD-10-CM

## 2020-05-03 DIAGNOSIS — I10 Essential (primary) hypertension: Secondary | ICD-10-CM

## 2020-05-03 NOTE — Progress Notes (Signed)
Location:  [facility] Room Number: 211 A Place of Service:  SNF (31) Provider:  Durenda Age, DNP, FNP-BC  Patient Care Team: [name], MD as PCP - General (Internal Medicine) Alda Berthold, DO as Consulting Physician (Neurology)  Extended Emergency Contact Information Primary Emergency Contact: Lanius,[name] Address: [address]          [place], [place] [address] [place] Phone: [phone] Mobile Phone: [phone] Relation: Brother  Code Status:  Full Code  Goals of care: Advanced Directive information Advanced Directives 04/30/2020  Does Patient Have a Medical Advance Directive? Yes  Type of Advance Directive Living will  Does patient want to make changes to medical advance directive? No - Patient declined  Copy of [place] in Chart? -  Would patient like information on creating a medical advance directive? -  Pre-existing out of facility DNR order (yellow form or pink MOST form) -     Chief Complaint  Patient presents with  . Acute Visit    Hospitalization follow-up    HPI:  Pt is a 70 y.o. male who was admitted to [place] [facility] on 05/02/20 post [facility] admission 04/29/20 to 05/02/20. 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 He was admitted to the hospital with urinary retention requiring foley catheter insertion. He takes Flomax. He was found to have heavy stool burden. He had a fall prior to hospitalization. CT head did not reveal any acute findings. CT of the abdomen showed non obstructing nephrolithiasis, no hydronephrosis and moderate stool burden.    He was seen in the room today. He stated that his lower back pain radiates to his right leg. He is currently taking Oxy-IR 15 mg every 4 hours PRN. He agreed to decrease Oxy IR to every 6 hours PRN.   Past Medical History:  Diagnosis Date  . Arthritis   . Chronic back pain   . Chronic pain  1999   Bilateral feet (R >L)  . Difficult or painful urination   . High blood pressure   . History of kidney stones    H/O  . Nerve damage   . Pain management   . Paresthesia of foot, bilateral   . Sleep apnea    USES CPAP  . Weakness of both legs    Past Surgical History:  Procedure Laterality Date  . arm surgery    . BACK SURGERY     X2  . FACIAL COSMETIC SURGERY    . HERNIA REPAIR    . IR US GUIDE BX ASP/DRAIN  10/19/2017  . KIDNEY STONE SURGERY    . LUMBAR SPINAL CORD SIMULATOR LEAD REMOVAL N/A 03/01/2019   Procedure: LUMBAR SPINAL CORD SIMULATOR LEAD REMOVAL;  Surgeon: Meade Maw, MD;  Location: [facility] ORS;  Service: Neurosurgery;  Laterality: N/A;  . MASTECTOMY Left   . SPINAL CORD STIMULATOR REMOVAL N/A 03/01/2019   Procedure: LUMBAR SPINAL CORD STIMULATOR REMOVAL;  Surgeon: Meade Maw, MD;  Location: [facility] ORS;  Service: Neurosurgery;  Laterality: N/A;    Allergies  Allergen Reactions  . Erythromycin Itching    Outpatient Encounter Medications as of 05/03/2020  Medication Sig  . ARIPiprazole (ABILIFY) 5 MG tablet Take 1 tablet (5 mg total) by mouth daily.  . cholecalciferol (VITAMIN D3) 25 MCG (1000 UNIT) tablet Take 1,000 Units by mouth daily.  . diazepam (VALIUM) 10 MG tablet Take 1 tablet (10 mg total) by mouth 2 (  two) times daily as needed (restless legs).  Marland Kitchen diltiazem (CARDIZEM CD) 120 MG 24 hr capsule Take 120 mg by mouth every evening.   . docusate sodium (COLACE) 100 MG capsule Take 100 mg by mouth in the morning, at noon, and at bedtime.  . mirabegron ER (MYRBETRIQ) 50 MG TB24 tablet Take 50 mg by mouth daily.  Marland Kitchen oxyCODONE (ROXICODONE) 15 MG immediate release tablet Take 1 tablet (15 mg total) by mouth every 4 (four) hours as needed.  . polyethylene glycol (MIRALAX / GLYCOLAX) 17 g packet Take 17 g by mouth 2 (two) times daily.  Marland Kitchen senna-docusate (SENOKOT-S) 8.6-50 MG tablet Take 1 tablet by mouth 2 (two) times daily.  . tamsulosin (FLOMAX) 0.4 MG  CAPS capsule Take 0.4 mg by mouth every evening.   . tolterodine (DETROL) 1 MG tablet Take 1 mg by mouth 2 (two) times daily.  . traZODone (DESYREL) 50 MG tablet Take 1 tablet (50 mg total) by mouth at bedtime.  Marland Kitchen venlafaxine XR (EFFEXOR-XR) 75 MG 24 hr capsule Take 75 mg by mouth in the morning and at bedtime.  . vitamin B-12 (CYANOCOBALAMIN) 500 MCG tablet Take 1 tablet (500 mcg total) by mouth daily. (Patient taking differently: Take 1,000 mcg by mouth in the morning and at bedtime.)   No facility-administered encounter medications on file as of 05/03/2020.    Review of Systems  GENERAL: No change in appetite, no fatigue, no weight changes, no fever, chills or weakness MOUTH and THROAT: Denies oral discomfort, gingival pain or bleeding RESPIRATORY: no cough, SOB, DOE, wheezing, hemoptysis CARDIAC: No chest pain, edema or palpitations GI: No abdominal pain, diarrhea, constipation, heart burn, nausea or vomiting GU: Denies dysuria or discharge NEUROLOGICAL: Denies dizziness, syncope, numbness, or headache PSYCHIATRIC: Denies feelings of depression or anxiety. No report of hallucinations, insomnia, paranoia, or agitation   Immunization History  Administered Date(s) Administered  . Influenza-Unspecified 12/10/2017  . PFIZER(Purple Top)SARS-COV-2 Vaccination 04/02/2019, 04/25/2019   Pertinent  Health Maintenance Due  Topic Date Due  . COLONOSCOPY (Pts 45-68yrs Insurance coverage will need to be confirmed)  Never done  . PNA vac Low Risk Adult (1 of 2 - PCV13) Never done  . INFLUENZA VACCINE  09/10/2019   Fall Risk  12/02/2018 11/30/2018  Falls in the past year? 0 1  Number falls in past yr: 0 0  Injury with Fall? 0 0     Vitals:   05/03/20 1300  BP: 126/78  Pulse: 66  Resp: 17  Temp: (!) 97.3 F (36.3 C)  Weight: 182 lb (82.6 kg)  Height: 5\' 9"  (1.753 m)   Body mass index is 26.88 kg/m.  Physical Exam  GENERAL APPEARANCE: Well nourished. In no acute distress. Normal  body habitus SKIN:  Coccyx pressure wound stage II MOUTH and THROAT: Lips are without lesions. Oral mucosa is moist and without lesions.  RESPIRATORY: Breathing is even & unlabored, BS CTAB CARDIAC: RRR, no murmur,no extra heart sounds, no edema GI: Abdomen soft, normal BS, no masses, no tenderness GU:  Foley catheter fr 16, draining to urine bag EXTREMITIES:  Able to move X 4 extremities NEUROLOGICAL: There is no tremor. Speech is clear. Alert and oriented X 3. PSYCHIATRIC:  Affect and behavior are appropriate  Labs reviewed: Recent Labs    04/29/20 2015 04/30/20 0644 05/01/20 0531  NA 138 139 141  K 4.2 3.6 3.8  CL 102 106 108  CO2 23 23 24   GLUCOSE 144* 101* 98  BUN 16 16  14  CREATININE 1.40* 1.18 1.15  CALCIUM 9.2 8.6* 8.8*  MG  --  2.1  --   PHOS  --  2.5  --    Recent Labs    04/30/20 0644  AST 14*  ALT 9  ALKPHOS 85  BILITOT 0.9  PROT 6.5  ALBUMIN 3.6   Recent Labs    04/29/20 2015 04/30/20 0644  WBC 10.5 10.0  NEUTROABS 9.7*  --   HGB 15.6 13.4  HCT 48.2 41.0  MCV 90.3 91.1  PLT 195 190   Lab Results  Component Value Date   TSH 0.427 05/01/2020    Significant Diagnostic Results in last 30 days:  CT Head Wo Contrast  Result Date: 04/29/2020 CLINICAL DATA:  Fall, hit left side of head EXAM: CT HEAD WITHOUT CONTRAST TECHNIQUE: Contiguous axial images were obtained from the base of the skull through the vertex without intravenous contrast. COMPARISON:  03/07/2019 FINDINGS: Brain: There is atrophy and chronic small vessel disease changes. No acute intracranial abnormality. Specifically, no hemorrhage, hydrocephalus, mass lesion, acute infarction, or significant intracranial injury. Vascular: No hyperdense vessel or unexpected calcification. Skull: No acute calvarial abnormality. Sinuses/Orbits: No acute findings Other: None IMPRESSION: Atrophy, chronic microvascular disease. No acute intracranial abnormality. Electronically Signed   By: Rolm Baptise M.D.    On: 04/29/2020 23:36   CT Renal Stone Study  Result Date: 04/29/2020 CLINICAL DATA:  70 year old male with flank pain. Concern for kidney stone. EXAM: CT ABDOMEN AND PELVIS WITHOUT CONTRAST TECHNIQUE: Multidetector CT imaging of the abdomen and pelvis was performed following the standard protocol without IV contrast. COMPARISON:  CT abdomen pelvis dated 03/07/2019. FINDINGS: Evaluation of this exam is limited in the absence of intravenous contrast. Lower chest: The visualized lung bases are clear. Coronary vascular calcifications noted. No intra-abdominal free air or free fluid. Hepatobiliary: The liver is unremarkable. The gallbladder is distended. No calcified gallstone or pericholecystic fluid. Pancreas: Unremarkable. No pancreatic ductal dilatation or surrounding inflammatory changes. Spleen: Normal in size without focal abnormality. Adrenals/Urinary Tract: The adrenal glands are unremarkable. There is a 10 cm right renal cyst. Several nonobstructing stones noted in the mid to lower pole of the right kidney. A 15 mm curvilinear calcification in the interpolar right kidney along the wall of the cyst, new since the prior CT, and likely a nonobstructing stone and less likely focal calcification of the cyst wall. No hydronephrosis. There is a 3 cm left renal inferior pole exophytic cyst. There is no hydronephrosis or nephrolithiasis on the left. The visualized ureters appear unremarkable. The urinary bladder is decompressed around a Foley catheter. Stomach/Bowel: There is moderate amount of stool throughout the colon with a large stool in the rectal vault. There is no bowel obstruction or active inflammation. The appendix is normal. Vascular/Lymphatic: Moderate aortoiliac atherosclerotic disease. The IVC is unremarkable. No portal venous gas. There is no adenopathy. Reproductive: The prostate gland is enlarged measuring 6 cm in transverse axial diameter. The seminal vesicles are symmetric. Other: None  Musculoskeletal: Osteopenia with degenerative changes of the spine. L4-S1 posterior fusion. No acute osseous pathology. Old T12 and L1 compression fractures. IMPRESSION: 1. Nonobstructing right renal stones. No hydronephrosis or obstructing stone. 2. Moderate colonic stool burden. No bowel obstruction. Normal appendix. 3. Aortic Atherosclerosis (ICD10-I70.0). Electronically Signed   By: Anner Crete M.D.   On: 04/29/2020 21:45    Assessment/Plan  1. Acute urinary retention -   Currently has foley catheter -   Continue Flomax -   Follow-  up with urology in 2 weeks  2. Drug-induced constipation -   Currently on PRN Oxy IR which will be decreased in frequency -    Continue Docusate, Senna-S and Miralax  3. Chronic pain syndrome -  Stable -   Will decrease Oxy IR 15 mg from every 4 hours PRN to every 6 hours PRN  4. Primary hypertension -  Stable, continue Diltiazem 24H ER  5.  Generalized weakness  -  For PT and OT, for therapeutic strengthening exercises -  Fall precaution   Family/ staff Communication:  Discussed plan of care with resident and charge nurse.  Labs/tests ordered:  None  Goals of care:   Short-term care   Durenda Age, DNP, MSN, FNP-BC [facility] and [facility] [phone] (Monday-Friday 8:00 a.m. - 5:00 p.m.) [phone] (after hours)

## 2020-05-06 ENCOUNTER — Non-Acute Institutional Stay (SKILLED_NURSING_FACILITY): Payer: Medicare Other | Admitting: Internal Medicine

## 2020-05-06 ENCOUNTER — Encounter: Payer: Self-pay | Admitting: Internal Medicine

## 2020-05-06 DIAGNOSIS — G894 Chronic pain syndrome: Secondary | ICD-10-CM | POA: Diagnosis not present

## 2020-05-06 DIAGNOSIS — K5903 Drug induced constipation: Secondary | ICD-10-CM

## 2020-05-06 DIAGNOSIS — R338 Other retention of urine: Secondary | ICD-10-CM

## 2020-05-06 DIAGNOSIS — L89151 Pressure ulcer of sacral region, stage 1: Secondary | ICD-10-CM

## 2020-05-06 NOTE — Assessment & Plan Note (Addendum)
Subjectively improved. Continue stool regimen with MiraLAX, Dulcolax, Senokot at SNF.

## 2020-05-06 NOTE — Patient Instructions (Signed)
See assessment and plan under each diagnosis in the problem list and acutely for this visit 

## 2020-05-06 NOTE — Assessment & Plan Note (Addendum)
[facility] will follow with the patient approximately 2 weeks post discharge.

## 2020-05-06 NOTE — Progress Notes (Signed)
NURSING HOME LOCATION:  [facility] ROOM NUMBER:  [id number] CODE STATUS:  Full Code  PCP:  [name] MD  This is a comprehensive admission note to this SNFperformed on this date less than 30 days from date of admission. Included are preadmission medical/surgical history; reconciled medication list; family history; social history and comprehensive review of systems.  Corrections and additions to the records were documented. Comprehensive physical exam was also performed. Additionally a clinical summary was entered for each active diagnosis pertinent to this admission in the Problem List to enhance continuity of care.  HPI: Patient was hospitalized 3/21-3/24/2022 with acute urinary retention in the context of history of BPH, history of catastrophic nerve damage, and narcotic use for chronic pain.  Heavy stool burden was documented.  Foley was placed in the ED and Flomax continued. Constipation regimen was continued with MiraLAX, Dulcolax, and Senokot. He was admitted for observation because of generalized weakness. CT scan of the head was performed as there was a history of a fall PTA in which he struck his head.  This did not reveal any acute process. In the ED Rocephin and lidocaine patch were initiated. He did develop AKI with a creatinine of 1.4; with treatment creatinine was 1.15 prior to discharge. Stage II sacral decubitus was present on admission. PT & OT recommended SNF placement as patient lives alone and cannot provide adequate self-care and is falling frequently. [facility] will see the patient in approximately 2 weeks post discharge.  Past medical and surgical history: Includes sleep apnea, history of nephrolithiasis, essential hypertension, and posttraumatic nerve damage X 2. He has had surgery for nephrolithiasis as well as back surgery on 2 occasions.  Lumbar spinal cord stimulator and been placed in the past.  He also has a history of left  mastectomy.  Social history: Nondrinker; former 20-pack-year smoker.  Family history: Reviewed   Review of systems: Constipation has improved with the bowel regimen.  Foley is functioning well. He describes chronic neuropathy in the feet as if wearing shoes "2 sizes too small & tightly laced". He cannot remember his Psychiatrist's name but the multiple psychotropic drugs are prescribed by a mental health specialist for chronic depression.  Constitutional: No fever, significant weight change, fatigue  Eyes: No redness, discharge, pain, vision change ENT/mouth: No nasal congestion, purulent discharge, earache, change in hearing, sore throat  Cardiovascular: No chest pain, palpitations, paroxysmal nocturnal dyspnea, edema  Respiratory: No cough, sputum production, hemoptysis Gastrointestinal: No heartburn, dysphagia, abdominal pain, nausea /vomiting, rectal bleeding, melena Genitourinary: No dysuria, hematuria, pyuria Musculoskeletal: No joint stiffness, joint swelling Neurologic: No dizziness, headache, syncope, seizures Psychiatric: No significant  insomnia, anorexia Endocrine: No change in hair/skin/nails, excessive thirst, excessive hunger, excessive urination  Hematologic/lymphatic: No significant bruising, lymphadenopathy, abnormal bleeding Allergy/immunology: No itchy/watery eyes, significant sneezing, urticaria, angioedema  Physical exam:  Pertinent or positive findings: Hair is disheveled.  He has a thin mustache.  Eyebrows are virtually absent.  Left nasolabial fold is decreased.  He has diffuse irregular facial rosacea dermatitis.  First heart sound slightly accentuated.  Posterior tibial pulses are stronger than the dorsalis pedis pulses.  Foley catheter is present. The left lower extremity is stronger than the right lower extremity.  The upper extremities appear to have equal strength to opposition.  The left foot tends to be inverted at rest but he can move it laterally and  medially.  General appearance: Adequately nourished; no acute distress, increased work of breathing is present.  Lymphatic: No lymphadenopathy about the head, neck, axilla. Eyes: No conjunctival inflammation or lid edema is present. There is no scleral icterus. Ears:  External ear exam shows no significant lesions or deformities.   Nose:  External nasal examination shows no deformity or inflammation. Nasal mucosa are pink and moist without lesions, exudates Oral exam: Lips and gums are healthy appearing.There is no oropharyngeal erythema or exudate. Neck:  No thyromegaly, masses, tenderness noted.    Heart:  Normal rate and regular rhythm.  S2 normal without gallop, murmur, click, rub.  Lungs: Chest clear to auscultation without wheezes, rhonchi, rales, rubs. Abdomen: Bowel sounds are normal.  Abdomen is soft and nontender with no organomegaly, hernias, masses. GU: Deferred  Extremities:  No cyanosis, clubbing, edema. Neurologic exam: Balance, Rhomberg, finger to nose testing could not be completed due to clinical state Skin: Warm & dry w/o tenting. No significant lesions   See clinical summary under each active problem in the Problem List with associated updated therapeutic plan

## 2020-05-06 NOTE — Assessment & Plan Note (Addendum)
Opioids have been decreased to every 6 hours as needed.  The patient is in agreement with this as he understands the complications of the chronic opioid administration . This is the second such severe ADE necessitating hospitalization.

## 2020-05-06 NOTE — Assessment & Plan Note (Addendum)
[facility] Nurse states that the right coccyx lesion is small and superficial She will continue to monitor at [facility]

## 2020-05-07 ENCOUNTER — Encounter: Payer: Self-pay | Admitting: Internal Medicine

## 2020-05-08 DIAGNOSIS — I10 Essential (primary) hypertension: Secondary | ICD-10-CM | POA: Diagnosis not present

## 2020-05-09 ENCOUNTER — Other Ambulatory Visit: Payer: Self-pay | Admitting: Adult Health

## 2020-05-09 ENCOUNTER — Non-Acute Institutional Stay (SKILLED_NURSING_FACILITY): Payer: Medicare Other | Admitting: Adult Health

## 2020-05-09 DIAGNOSIS — R310 Gross hematuria: Secondary | ICD-10-CM | POA: Diagnosis not present

## 2020-05-09 MED ORDER — OXYCODONE HCL 15 MG PO TABS
15.0000 mg | ORAL_TABLET | Freq: Four times a day (QID) | ORAL | 0 refills | Status: DC | PRN
Start: 2020-05-09 — End: 2020-05-17

## 2020-05-10 ENCOUNTER — Encounter: Payer: Self-pay | Admitting: Adult Health

## 2020-05-10 DIAGNOSIS — R310 Gross hematuria: Secondary | ICD-10-CM | POA: Diagnosis not present

## 2020-05-10 DIAGNOSIS — R338 Other retention of urine: Secondary | ICD-10-CM | POA: Diagnosis not present

## 2020-05-10 NOTE — Progress Notes (Signed)
Location:  [facility] Room Number: 211/A Place of Service:  SNF (31) Provider:  Durenda Age, DNP, FNP-BC  Patient Care Team: [name], MD as PCP - General (Internal Medicine) Alda Berthold, DO as Consulting Physician (Neurology)  Extended Emergency Contact Information Primary Emergency Contact: Larcom,[name] Address: [address]          [place], [place] [address] [place] Phone: [phone] Mobile Phone: [phone] Relation: Brother  Code Status:  Full Code  Goals of care: Advanced Directive information Advanced Directives 05/10/2020  Does Patient Have a Medical Advance Directive? Yes  Type of Paramedic of [place];Living will  Does patient want to make changes to medical advance directive? No - Patient declined  Copy of [place] in Chart? Yes - validated most recent copy scanned in chart (See row information)  Would patient like information on creating a medical advance directive? -  Pre-existing out of facility DNR order (yellow form or pink MOST form) -     Chief Complaint  Patient presents with  . Acute Visit    Hematuria (BLOOD in Urine)    HPI:  Pt is a 70 y.o. male seen today for hematuria. He is a short-term care resident of [facility]. He has a chronic indwelling catheter due to urinary retention. He was noted to have bright red urine on draining on his foley catheter to urine bag. He denies abdominal pain.  He is on Flomax.  CT of the abdomen showed nonobstructing nephrolithiasis, no hydronephrosis and moderate stool burden.  Past Medical History:  Diagnosis Date  . Arthritis   . Chronic back pain   . Chronic pain 1999   Bilateral feet (R >L)  . Difficult or painful urination   . High blood pressure   . History of kidney stones    H/O  . Nerve damage   . Pain management   . Paresthesia of foot, bilateral   . Sleep apnea     USES CPAP  . Weakness of both legs    Past Surgical History:  Procedure Laterality Date  . arm surgery    . BACK SURGERY     X2  . FACIAL COSMETIC SURGERY    . HERNIA REPAIR    . IR US GUIDE BX ASP/DRAIN  10/19/2017  . KIDNEY STONE SURGERY    . LUMBAR SPINAL CORD SIMULATOR LEAD REMOVAL N/A 03/01/2019   Procedure: LUMBAR SPINAL CORD SIMULATOR LEAD REMOVAL;  Surgeon: Meade Maw, MD;  Location: [facility] ORS;  Service: Neurosurgery;  Laterality: N/A;  . MASTECTOMY Left   . SPINAL CORD STIMULATOR REMOVAL N/A 03/01/2019   Procedure: LUMBAR SPINAL CORD STIMULATOR REMOVAL;  Surgeon: Meade Maw, MD;  Location: [facility] ORS;  Service: Neurosurgery;  Laterality: N/A;    Allergies  Allergen Reactions  . Erythromycin Itching    Outpatient Encounter Medications as of 05/09/2020  Medication Sig  . ARIPiprazole (ABILIFY) 5 MG tablet Take 1 tablet (5 mg total) by mouth daily.  . cholecalciferol (VITAMIN D3) 25 MCG (1000 UNIT) tablet Take 1,000 Units by mouth daily.  . diazepam (VALIUM) 10 MG tablet Take 1 tablet (10 mg total) by mouth 2 (two) times daily as needed (restless legs).  Marland Kitchen diltiazem (CARDIZEM CD) 120 MG 24 hr capsule Take 120 mg by mouth every evening.   . docusate sodium (COLACE) 100 MG capsule Take 100 mg by mouth in the morning, at noon, and at bedtime.  Marland Kitchen  mirabegron ER (MYRBETRIQ) 50 MG TB24 tablet Take 50 mg by mouth daily.  Marland Kitchen oxyCODONE (ROXICODONE) 15 MG immediate release tablet Take 1 tablet (15 mg total) by mouth every 6 (six) hours as needed for pain.  . polyethylene glycol (MIRALAX / GLYCOLAX) 17 g packet Take 17 g by mouth 2 (two) times daily.  Marland Kitchen senna-docusate (SENOKOT-S) 8.6-50 MG tablet Take 1 tablet by mouth 2 (two) times daily.  . tamsulosin (FLOMAX) 0.4 MG CAPS capsule Take 0.4 mg by mouth every evening.   . tolterodine (DETROL) 1 MG tablet Take 1 mg by mouth 2 (two) times daily.  . traZODone (DESYREL) 50 MG tablet Take 1 tablet (50 mg total) by mouth at  bedtime.  Marland Kitchen venlafaxine XR (EFFEXOR-XR) 75 MG 24 hr capsule Take 75 mg by mouth in the morning and at bedtime.  . vitamin B-12 (CYANOCOBALAMIN) 500 MCG tablet Take 1 tablet (500 mcg total) by mouth daily. (Patient taking differently: Take 1,000 mcg by mouth in the morning and at bedtime.)   No facility-administered encounter medications on file as of 05/09/2020.    Review of Systems  GENERAL: No change in appetite, no fatigue, no weight changes, no fever, chills or weakness MOUTH and THROAT: Denies oral discomfort, gingival pain or bleeding RESPIRATORY: no cough, SOB, DOE, wheezing, hemoptysis CARDIAC: No chest pain, edema or palpitations GI: No abdominal pain, diarrhea, constipation, heart burn, nausea or vomiting NEUROLOGICAL: Denies dizziness, syncope or headache PSYCHIATRIC: Denies feelings of depression or anxiety. No report of hallucinations, insomnia, paranoia, or agitation   Immunization History  Administered Date(s) Administered  . Influenza-Unspecified 12/10/2017  . PFIZER(Purple Top)SARS-COV-2 Vaccination 04/02/2019, 04/25/2019   Pertinent  Health Maintenance Due  Topic Date Due  . COLONOSCOPY (Pts 45-49yrs Insurance coverage will need to be confirmed)  Never done  . PNA vac Low Risk Adult (1 of 2 - PCV13) Never done  . INFLUENZA VACCINE  09/09/2020   Fall Risk  12/02/2018 11/30/2018  Falls in the past year? 0 1  Number falls in past yr: 0 0  Injury with Fall? 0 0     Vitals:   05/10/20 1008  BP: 137/78  Pulse: 76  Resp: 18  Temp: (!) 97 F (36.1 C)  SpO2: 98%  Weight: 179 lb 9.6 oz (81.5 kg)  Height: 5\' 8"  (1.727 m)   Body mass index is 27.31 kg/m.  Physical Exam  GENERAL APPEARANCE: Well nourished. In no acute distress. Normal body habitus SKIN:  Skin is warm and dry.  MOUTH and THROAT: Lips are without lesions. Oral mucosa is moist and without lesions.  RESPIRATORY: Breathing is even & unlabored, BS CTAB CARDIAC: RRR, no murmur,no extra heart  sounds, no edema GI: Abdomen soft, normal BS, no masses, no tenderness GU:  Has foley catheter NEUROLOGICAL: There is no tremor. Speech is clear. Alert and oriented X 3. PSYCHIATRIC:  Affect and behavior are appropriate  Labs reviewed: Recent Labs    04/29/20 2015 04/30/20 0644 05/01/20 0531  NA 138 139 141  K 4.2 3.6 3.8  CL 102 106 108  CO2 23 23 24   GLUCOSE 144* 101* 98  BUN 16 16 14   CREATININE 1.40* 1.18 1.15  CALCIUM 9.2 8.6* 8.8*  MG  --  2.1  --   PHOS  --  2.5  --    Recent Labs    04/30/20 0644  AST 14*  ALT 9  ALKPHOS 85  BILITOT 0.9  PROT 6.5  ALBUMIN 3.6   Recent  Labs    04/29/20 2015 04/30/20 0644  WBC 10.5 10.0  NEUTROABS 9.7*  --   HGB 15.6 13.4  HCT 48.2 41.0  MCV 90.3 91.1  PLT 195 190   Lab Results  Component Value Date   TSH 0.427 05/01/2020    Significant Diagnostic Results in last 30 days:  CT Head Wo Contrast  Result Date: 04/29/2020 CLINICAL DATA:  Fall, hit left side of head EXAM: CT HEAD WITHOUT CONTRAST TECHNIQUE: Contiguous axial images were obtained from the base of the skull through the vertex without intravenous contrast. COMPARISON:  03/07/2019 FINDINGS: Brain: There is atrophy and chronic small vessel disease changes. No acute intracranial abnormality. Specifically, no hemorrhage, hydrocephalus, mass lesion, acute infarction, or significant intracranial injury. Vascular: No hyperdense vessel or unexpected calcification. Skull: No acute calvarial abnormality. Sinuses/Orbits: No acute findings Other: None IMPRESSION: Atrophy, chronic microvascular disease. No acute intracranial abnormality. Electronically Signed   By: Rolm Baptise M.D.   On: 04/29/2020 23:36   CT Renal Stone Study  Result Date: 04/29/2020 CLINICAL DATA:  70 year old male with flank pain. Concern for kidney stone. EXAM: CT ABDOMEN AND PELVIS WITHOUT CONTRAST TECHNIQUE: Multidetector CT imaging of the abdomen and pelvis was performed following the standard protocol  without IV contrast. COMPARISON:  CT abdomen pelvis dated 03/07/2019. FINDINGS: Evaluation of this exam is limited in the absence of intravenous contrast. Lower chest: The visualized lung bases are clear. Coronary vascular calcifications noted. No intra-abdominal free air or free fluid. Hepatobiliary: The liver is unremarkable. The gallbladder is distended. No calcified gallstone or pericholecystic fluid. Pancreas: Unremarkable. No pancreatic ductal dilatation or surrounding inflammatory changes. Spleen: Normal in size without focal abnormality. Adrenals/Urinary Tract: The adrenal glands are unremarkable. There is a 10 cm right renal cyst. Several nonobstructing stones noted in the mid to lower pole of the right kidney. A 15 mm curvilinear calcification in the interpolar right kidney along the wall of the cyst, new since the prior CT, and likely a nonobstructing stone and less likely focal calcification of the cyst wall. No hydronephrosis. There is a 3 cm left renal inferior pole exophytic cyst. There is no hydronephrosis or nephrolithiasis on the left. The visualized ureters appear unremarkable. The urinary bladder is decompressed around a Foley catheter. Stomach/Bowel: There is moderate amount of stool throughout the colon with a large stool in the rectal vault. There is no bowel obstruction or active inflammation. The appendix is normal. Vascular/Lymphatic: Moderate aortoiliac atherosclerotic disease. The IVC is unremarkable. No portal venous gas. There is no adenopathy. Reproductive: The prostate gland is enlarged measuring 6 cm in transverse axial diameter. The seminal vesicles are symmetric. Other: None Musculoskeletal: Osteopenia with degenerative changes of the spine. L4-S1 posterior fusion. No acute osseous pathology. Old T12 and L1 compression fractures. IMPRESSION: 1. Nonobstructing right renal stones. No hydronephrosis or obstructing stone. 2. Moderate colonic stool burden. No bowel obstruction. Normal  appendix. 3. Aortic Atherosclerosis (ICD10-I70.0). Electronically Signed   By: Anner Crete M.D.   On: 04/29/2020 21:45    Assessment/Plan  1. Gross hematuria -  Will get urinalysis with culture and sensitivity to rule out UTI -  CBC to check for drop in hgb/anemia -  Will follow up with urology     Family/ staff Communication:  Discussed plan of care with resident and charge nurse.  Labs/tests ordered:   CBC, urinalysis with culture and sensitivity and BMP  Goals of care:   Short-term care   Durenda Age, DNP, MSN, FNP-BC [facility] [phone] (Monday-Friday 8:00 a.m. - 5:00 p.m.) [phone] (after hours)

## 2020-05-16 ENCOUNTER — Encounter: Payer: Self-pay | Admitting: Adult Health

## 2020-05-16 ENCOUNTER — Non-Acute Institutional Stay (SKILLED_NURSING_FACILITY): Payer: Medicare Other | Admitting: Adult Health

## 2020-05-16 DIAGNOSIS — K5903 Drug induced constipation: Secondary | ICD-10-CM

## 2020-05-16 DIAGNOSIS — R338 Other retention of urine: Secondary | ICD-10-CM

## 2020-05-16 DIAGNOSIS — F329 Major depressive disorder, single episode, unspecified: Secondary | ICD-10-CM

## 2020-05-16 DIAGNOSIS — N401 Enlarged prostate with lower urinary tract symptoms: Secondary | ICD-10-CM

## 2020-05-16 DIAGNOSIS — I10 Essential (primary) hypertension: Secondary | ICD-10-CM

## 2020-05-16 DIAGNOSIS — G894 Chronic pain syndrome: Secondary | ICD-10-CM | POA: Diagnosis not present

## 2020-05-16 NOTE — Progress Notes (Signed)
Location:  [facility] Room Number: 211 A Place of Service:  SNF (31) Provider:  Durenda Age, DNP, FNP-BC  Patient Care Team: [name], MD as PCP - General (Internal Medicine) Alda Berthold, DO as Consulting Physician (Neurology)  Extended Emergency Contact Information Primary Emergency Contact: Sedeno,[name] Address: [address]          [place] [address], [place] [address] [place] Phone: [phone] Mobile Phone: [phone] Relation: Brother  Code Status: Full code  Goals of care: Advanced Directive information Advanced Directives 05/10/2020  Does Patient Have a Medical Advance Directive? Yes  Type of Paramedic of [place];Living will  Does patient want to make changes to medical advance directive? No - Patient declined  Copy of [place] in Chart? Yes - validated most recent copy scanned in chart (See row information)  Would patient like information on creating a medical advance directive? -  Pre-existing out of facility DNR order (yellow form or pink MOST form) -     Chief Complaint  Patient presents with  . Acute Visit    Short-term rehabilitation visit    HPI:  Pt is a 70 y.o. male seen today for short-term rehabilitation visit. He is a short-term care resident of [facility]. He was seen in his room today.  Foley catheter has been discontinued on 05/10/20. He denies urinary retention nor hematuria. He was recently started on Finasteride 5 mg daily together with tamsulosin 0.4 mg every evening for BPH with urinary retention. SBPs ranging from 118-148.  He takes diltiazem 24-hour ER 120 mg 1 capsule every evening for hypertension.  Past Medical History:  Diagnosis Date  . Arthritis   . Chronic back pain   . Chronic pain 1999   Bilateral feet (R >L)  . Difficult or painful urination   . High blood pressure   . History of kidney stones    H/O   . Nerve damage   . Pain management   . Paresthesia of foot, bilateral   . Sleep apnea    USES CPAP  . Weakness of both legs    Past Surgical History:  Procedure Laterality Date  . arm surgery    . BACK SURGERY     X2  . FACIAL COSMETIC SURGERY    . HERNIA REPAIR    . IR US GUIDE BX ASP/DRAIN  10/19/2017  . KIDNEY STONE SURGERY    . LUMBAR SPINAL CORD SIMULATOR LEAD REMOVAL N/A 03/01/2019   Procedure: LUMBAR SPINAL CORD SIMULATOR LEAD REMOVAL;  Surgeon: Meade Maw, MD;  Location: [facility] ORS;  Service: Neurosurgery;  Laterality: N/A;  . MASTECTOMY Left   . SPINAL CORD STIMULATOR REMOVAL N/A 03/01/2019   Procedure: LUMBAR SPINAL CORD STIMULATOR REMOVAL;  Surgeon: Meade Maw, MD;  Location: [facility] ORS;  Service: Neurosurgery;  Laterality: N/A;    Allergies  Allergen Reactions  . Erythromycin Itching    Outpatient Encounter Medications as of 05/16/2020  Medication Sig  . ARIPiprazole (ABILIFY) 5 MG tablet Take 1 tablet (5 mg total) by mouth daily.  . cholecalciferol (VITAMIN D3) 25 MCG (1000 UNIT) tablet Take 1,000 Units by mouth daily.  . diazepam (VALIUM) 10 MG tablet Take 1 tablet (10 mg total) by mouth 2 (two) times daily as needed (restless legs).  Marland Kitchen diltiazem (CARDIZEM CD) 120 MG 24 hr capsule Take 120 mg by mouth every evening.   . docusate sodium (COLACE) 100 MG capsule Take 100  mg by mouth in the morning, at noon, and at bedtime.  . mirabegron ER (MYRBETRIQ) 50 MG TB24 tablet Take 50 mg by mouth daily.  Marland Kitchen oxyCODONE (ROXICODONE) 15 MG immediate release tablet Take 1 tablet (15 mg total) by mouth every 6 (six) hours as needed for pain.  . polyethylene glycol (MIRALAX / GLYCOLAX) 17 g packet Take 17 g by mouth 2 (two) times daily.  Marland Kitchen senna-docusate (SENOKOT-S) 8.6-50 MG tablet Take 1 tablet by mouth 2 (two) times daily.  . tamsulosin (FLOMAX) 0.4 MG CAPS capsule Take 0.4 mg by mouth every evening.   . tolterodine (DETROL) 1 MG tablet Take 1 mg by mouth 2 (two) times  daily.  . traZODone (DESYREL) 50 MG tablet Take 1 tablet (50 mg total) by mouth at bedtime.  Marland Kitchen venlafaxine XR (EFFEXOR-XR) 75 MG 24 hr capsule Take 75 mg by mouth in the morning and at bedtime.  . vitamin B-12 (CYANOCOBALAMIN) 500 MCG tablet Take 1 tablet (500 mcg total) by mouth daily. (Patient taking differently: Take 1,000 mcg by mouth in the morning and at bedtime.)   No facility-administered encounter medications on file as of 05/16/2020.    Review of Systems  GENERAL: No change in appetite, no fatigue, no weight changes, no fever or chills MOUTH and THROAT: Denies oral discomfort, gingival pain or bleeding RESPIRATORY: no cough, SOB, DOE, wheezing, hemoptysis CARDIAC: No chest pain, edema or palpitations GI: No abdominal pain, diarrhea, constipation, heart burn, nausea or vomiting GU: Denies dysuria, frequency, hematuria, incontinence, or discharge NEUROLOGICAL: Denies dizziness, syncope or headache PSYCHIATRIC: Denies feelings of depression or anxiety. No report of hallucinations, insomnia, paranoia, or agitation   Immunization History  Administered Date(s) Administered  . Influenza-Unspecified 12/10/2017  . PFIZER(Purple Top)SARS-COV-2 Vaccination 04/02/2019, 04/25/2019   Pertinent  Health Maintenance Due  Topic Date Due  . COLONOSCOPY (Pts 45-39yrs Insurance coverage will need to be confirmed)  Never done  . PNA vac Low Risk Adult (1 of 2 - PCV13) Never done  . INFLUENZA VACCINE  09/09/2020   Fall Risk  12/02/2018 11/30/2018  Falls in the past year? 0 1  Number falls in past yr: 0 0  Injury with Fall? 0 0     Vitals:   05/16/20 1000  BP: (!) 144/83  Pulse: 96  Resp: 18  Temp: (!) 97.3 F (36.3 C)  Weight: 181 lb 6.4 oz (82.3 kg)  Height: 5\' 8"  (1.727 m)   Body mass index is 27.58 kg/m.  Physical Exam  GENERAL APPEARANCE: Well nourished. In no acute distress.  SKIN:  Skin is warm and dry.  MOUTH and THROAT: Lips are without lesions. Oral mucosa is moist  and without lesions. Tongue is normal in shape, size, and color and without lesions RESPIRATORY: Breathing is even & unlabored, BS CTAB CARDIAC: RRR, no murmur,no extra heart sounds, no edema GI: Abdomen soft, normal BS, no masses, no tenderness EXTREMITIES: Able to move x4 extremities. NEUROLOGICAL: There is no tremor. Speech is clear. Alert and oriented X 3.  Right leg weakness PSYCHIATRIC:  Affect and behavior are appropriate  Labs reviewed: Recent Labs    04/29/20 2015 04/30/20 0644 05/01/20 0531  NA 138 139 141  K 4.2 3.6 3.8  CL 102 106 108  CO2 23 23 24   GLUCOSE 144* 101* 98  BUN 16 16 14   CREATININE 1.40* 1.18 1.15  CALCIUM 9.2 8.6* 8.8*  MG  --  2.1  --   PHOS  --  2.5  --  Recent Labs    04/30/20 0644  AST 14*  ALT 9  ALKPHOS 85  BILITOT 0.9  PROT 6.5  ALBUMIN 3.6   Recent Labs    04/29/20 2015 04/30/20 0644  WBC 10.5 10.0  NEUTROABS 9.7*  --   HGB 15.6 13.4  HCT 48.2 41.0  MCV 90.3 91.1  PLT 195 190   Lab Results  Component Value Date   TSH 0.427 05/01/2020    Significant Diagnostic Results in last 30 days:  CT Head Wo Contrast  Result Date: 04/29/2020 CLINICAL DATA:  Fall, hit left side of head EXAM: CT HEAD WITHOUT CONTRAST TECHNIQUE: Contiguous axial images were obtained from the base of the skull through the vertex without intravenous contrast. COMPARISON:  03/07/2019 FINDINGS: Brain: There is atrophy and chronic small vessel disease changes. No acute intracranial abnormality. Specifically, no hemorrhage, hydrocephalus, mass lesion, acute infarction, or significant intracranial injury. Vascular: No hyperdense vessel or unexpected calcification. Skull: No acute calvarial abnormality. Sinuses/Orbits: No acute findings Other: None IMPRESSION: Atrophy, chronic microvascular disease. No acute intracranial abnormality. Electronically Signed   By: Rolm Baptise M.D.   On: 04/29/2020 23:36   CT Renal Stone Study  Result Date: 04/29/2020 CLINICAL DATA:   70 year old male with flank pain. Concern for kidney stone. EXAM: CT ABDOMEN AND PELVIS WITHOUT CONTRAST TECHNIQUE: Multidetector CT imaging of the abdomen and pelvis was performed following the standard protocol without IV contrast. COMPARISON:  CT abdomen pelvis dated 03/07/2019. FINDINGS: Evaluation of this exam is limited in the absence of intravenous contrast. Lower chest: The visualized lung bases are clear. Coronary vascular calcifications noted. No intra-abdominal free air or free fluid. Hepatobiliary: The liver is unremarkable. The gallbladder is distended. No calcified gallstone or pericholecystic fluid. Pancreas: Unremarkable. No pancreatic ductal dilatation or surrounding inflammatory changes. Spleen: Normal in size without focal abnormality. Adrenals/Urinary Tract: The adrenal glands are unremarkable. There is a 10 cm right renal cyst. Several nonobstructing stones noted in the mid to lower pole of the right kidney. A 15 mm curvilinear calcification in the interpolar right kidney along the wall of the cyst, new since the prior CT, and likely a nonobstructing stone and less likely focal calcification of the cyst wall. No hydronephrosis. There is a 3 cm left renal inferior pole exophytic cyst. There is no hydronephrosis or nephrolithiasis on the left. The visualized ureters appear unremarkable. The urinary bladder is decompressed around a Foley catheter. Stomach/Bowel: There is moderate amount of stool throughout the colon with a large stool in the rectal vault. There is no bowel obstruction or active inflammation. The appendix is normal. Vascular/Lymphatic: Moderate aortoiliac atherosclerotic disease. The IVC is unremarkable. No portal venous gas. There is no adenopathy. Reproductive: The prostate gland is enlarged measuring 6 cm in transverse axial diameter. The seminal vesicles are symmetric. Other: None Musculoskeletal: Osteopenia with degenerative changes of the spine. L4-S1 posterior fusion. No  acute osseous pathology. Old T12 and L1 compression fractures. IMPRESSION: 1. Nonobstructing right renal stones. No hydronephrosis or obstructing stone. 2. Moderate colonic stool burden. No bowel obstruction. Normal appendix. 3. Aortic Atherosclerosis (ICD10-I70.0). Electronically Signed   By: Anner Crete M.D.   On: 04/29/2020 21:45    Assessment/Plan  1. Benign prostatic hyperplasia with urinary retention -  No urinary retention now -  Continue Finasteride and Flomamx  2. Primary hypertension -  BPs stable, continue diltiazem 24-hour  3. Chronic pain syndrome -Stable, continue PRN oxycodone IR  4. Drug-induced constipation -Continue MiraLAX  5.  Major depression,  chronic -  Mood is stable, continue venlafaxine and aripiprazole    Family/ staff Communication: Discussed plan of care with resident and charge nurse.  Labs/tests ordered: None  Goals of care:   Short-term care   Durenda Age, DNP, MSN, FNP-BC [facility] [phone] (Monday-Friday 8:00 a.m. - 5:00 p.m.) [phone] (after hours)

## 2020-05-17 ENCOUNTER — Encounter: Payer: Self-pay | Admitting: Adult Health

## 2020-05-17 ENCOUNTER — Non-Acute Institutional Stay (SKILLED_NURSING_FACILITY): Payer: Medicare Other | Admitting: Adult Health

## 2020-05-17 DIAGNOSIS — R339 Retention of urine, unspecified: Secondary | ICD-10-CM

## 2020-05-17 DIAGNOSIS — N401 Enlarged prostate with lower urinary tract symptoms: Secondary | ICD-10-CM | POA: Diagnosis not present

## 2020-05-17 DIAGNOSIS — F5101 Primary insomnia: Secondary | ICD-10-CM | POA: Diagnosis not present

## 2020-05-17 DIAGNOSIS — N3289 Other specified disorders of bladder: Secondary | ICD-10-CM | POA: Diagnosis not present

## 2020-05-17 DIAGNOSIS — G894 Chronic pain syndrome: Secondary | ICD-10-CM

## 2020-05-17 DIAGNOSIS — I10 Essential (primary) hypertension: Secondary | ICD-10-CM

## 2020-05-17 DIAGNOSIS — G4700 Insomnia, unspecified: Secondary | ICD-10-CM | POA: Diagnosis not present

## 2020-05-17 DIAGNOSIS — K5903 Drug induced constipation: Secondary | ICD-10-CM

## 2020-05-17 DIAGNOSIS — R338 Other retention of urine: Secondary | ICD-10-CM | POA: Diagnosis not present

## 2020-05-17 DIAGNOSIS — F329 Major depressive disorder, single episode, unspecified: Secondary | ICD-10-CM

## 2020-05-17 DIAGNOSIS — F331 Major depressive disorder, recurrent, moderate: Secondary | ICD-10-CM

## 2020-05-17 MED ORDER — MIRABEGRON ER 50 MG PO TB24: 50.0000 mg | ORAL_TABLET | Freq: Every day | ORAL | 0 refills | Status: DC

## 2020-05-17 MED ORDER — TOLTERODINE TARTRATE 1 MG PO TABS: 1.0000 mg | ORAL_TABLET | Freq: Two times a day (BID) | ORAL | 0 refills | Status: DC

## 2020-05-17 MED ORDER — ARIPIPRAZOLE 5 MG PO TABS
5.0000 mg | ORAL_TABLET | Freq: Every day | ORAL | 0 refills | Status: DC
Start: 2020-05-17 — End: 2021-11-21

## 2020-05-17 MED ORDER — FINASTERIDE 5 MG PO TABS: 5.0000 mg | ORAL_TABLET | Freq: Every day | ORAL | 0 refills | Status: DC

## 2020-05-17 MED ORDER — VITAMIN D 25 MCG (1000 UNIT) PO TABS: 1000.0000 [IU] | ORAL_TABLET | Freq: Every day | ORAL | 0 refills | Status: DC

## 2020-05-17 MED ORDER — CYANOCOBALAMIN 500 MCG PO TABS: 500.0000 ug | ORAL_TABLET | Freq: Every day | ORAL | 0 refills | Status: DC

## 2020-05-17 MED ORDER — DILTIAZEM HCL ER COATED BEADS 120 MG PO CP24: 120.0000 mg | ORAL_CAPSULE | Freq: Every evening | ORAL | 0 refills | Status: DC

## 2020-05-17 MED ORDER — POLYETHYLENE GLYCOL 3350 17 G PO PACK: 17.0000 g | PACK | Freq: Two times a day (BID) | ORAL | 3 refills | Status: AC

## 2020-05-17 MED ORDER — TRAZODONE HCL 50 MG PO TABS: 50.0000 mg | ORAL_TABLET | Freq: Every day | ORAL | 0 refills | Status: DC

## 2020-05-17 MED ORDER — DOCUSATE SODIUM 100 MG PO CAPS: 100.0000 mg | ORAL_CAPSULE | Freq: Three times a day (TID) | ORAL | 0 refills | Status: AC

## 2020-05-17 MED ORDER — VENLAFAXINE HCL ER 75 MG PO CP24: 75.0000 mg | ORAL_CAPSULE | Freq: Two times a day (BID) | ORAL | 0 refills | Status: AC

## 2020-05-17 MED ORDER — OXYCODONE HCL 15 MG PO TABS: 15.0000 mg | ORAL_TABLET | Freq: Four times a day (QID) | ORAL | 0 refills | Status: DC | PRN

## 2020-05-17 MED ORDER — TAMSULOSIN HCL 0.4 MG PO CAPS: 0.4000 mg | ORAL_CAPSULE | Freq: Every evening | ORAL | 0 refills | Status: AC

## 2020-05-17 NOTE — Progress Notes (Signed)
Location:  [facility] Room Number: 211/A Place of Service:  SNF (31) Provider:  Durenda Age, DNP, FNP-BC  Patient Care Team: [name], MD as PCP - General (Internal Medicine) Alda Berthold, DO as Consulting Physician (Neurology)  Extended Emergency Contact Information Primary Emergency Contact: Study,[name] Address: [address]          [place], [place] [address] [place] Phone: [phone] Mobile Phone: [phone] Relation: Brother  Code Status:    Full Code  Goals of care: Advanced Directive information Advanced Directives 05/17/2020  Does Patient Have a Medical Advance Directive? Yes  Type of Paramedic of [place];Living will  Does patient want to make changes to medical advance directive? No - Patient declined  Copy of [place] in Chart? Yes - validated most recent copy scanned in chart (See row information)  Would patient like information on creating a medical advance directive? -  Pre-existing out of facility DNR order (yellow form or pink MOST form) -     Chief Complaint  Patient presents with  . Discharge Note    Discharge Visit    HPI:  Pt is a 70 y.o. male who is for discharge on  05/20/20 with Home health PT and OT.  He was admitted to [facility] on 05/02/2020 post [facility] admission 04/29/2020 to 05/02/2020.  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  He was admitted to the hospital with urinary retention requiring Foley catheter insertion.  He was found to have heavy stool burden.  He had a fall prior to hospitalization.  CT head did not reveal any acute findings.  CT of the abdomen showed nonobstructing nephrolithiasis, no hydronephrosis and moderate stool burden.  At [facility], his Foley catheter has now been discontinued./01/22, urology started him on finasteride, in addition to Flomax.   He is now able to urinate without retention or hematuria.  Patient was admitted to this facility for short-term rehabilitation after the patient's recent hospitalization.  Patient has completed SNF rehabilitation and therapy has cleared the patient for discharge.   Past Medical History:  Diagnosis Date  . Arthritis   . Chronic back pain   . Chronic pain 1999   Bilateral feet (R >L)  . Difficult or painful urination   . High blood pressure   . History of kidney stones    H/O  . Nerve damage   . Pain management   . Paresthesia of foot, bilateral   . Sleep apnea    USES CPAP  . Weakness of both legs    Past Surgical History:  Procedure Laterality Date  . arm surgery    . BACK SURGERY     X2  . FACIAL COSMETIC SURGERY    . HERNIA REPAIR    . IR US GUIDE BX ASP/DRAIN  10/19/2017  . KIDNEY STONE SURGERY    . LUMBAR SPINAL CORD SIMULATOR LEAD REMOVAL N/A 03/01/2019   Procedure: LUMBAR SPINAL CORD SIMULATOR LEAD REMOVAL;  Surgeon: Meade Maw, MD;  Location: [facility] ORS;  Service: Neurosurgery;  Laterality: N/A;  . MASTECTOMY Left   . SPINAL CORD STIMULATOR REMOVAL N/A 03/01/2019   Procedure: LUMBAR SPINAL CORD STIMULATOR REMOVAL;  Surgeon: Meade Maw, MD;  Location: [facility] ORS;  Service: Neurosurgery;  Laterality: N/A;    Allergies  Allergen Reactions  . Erythromycin Itching    Outpatient Encounter Medications as of 05/17/2020  Medication  Sig  . ARIPiprazole (ABILIFY) 5 MG tablet Take 1 tablet (5 mg total) by mouth daily.  . cholecalciferol (VITAMIN D3) 25 MCG (1000 UNIT) tablet Take 1,000 Units by mouth daily.  Marland Kitchen diltiazem (CARDIZEM CD) 120 MG 24 hr capsule Take 120 mg by mouth every evening.   . docusate sodium (COLACE) 100 MG capsule Take 100 mg by mouth in the morning, at noon, and at bedtime.  . finasteride (PROSCAR) 5 MG tablet Take 5 mg by mouth daily.  . mirabegron ER (MYRBETRIQ) 50 MG TB24 tablet Take 50 mg by mouth daily.  Marland Kitchen oxyCODONE (ROXICODONE) 15 MG  immediate release tablet Take 1 tablet (15 mg total) by mouth every 6 (six) hours as needed for pain.  . polyethylene glycol (MIRALAX / GLYCOLAX) 17 g packet Take 17 g by mouth 2 (two) times daily.  Marland Kitchen senna-docusate (SENOKOT-S) 8.6-50 MG tablet Take 1 tablet by mouth 2 (two) times daily.  . tamsulosin (FLOMAX) 0.4 MG CAPS capsule Take 0.4 mg by mouth every evening.   . tolterodine (DETROL) 1 MG tablet Take 1 mg by mouth 2 (two) times daily.  . traZODone (DESYREL) 50 MG tablet Take 1 tablet (50 mg total) by mouth at bedtime.  Marland Kitchen venlafaxine XR (EFFEXOR-XR) 75 MG 24 hr capsule Take 75 mg by mouth in the morning and at bedtime.  . vitamin B-12 (CYANOCOBALAMIN) 500 MCG tablet Take 1 tablet (500 mcg total) by mouth daily.  . [DISCONTINUED] diazepam (VALIUM) 10 MG tablet Take 1 tablet (10 mg total) by mouth 2 (two) times daily as needed (restless legs).   No facility-administered encounter medications on file as of 05/17/2020.    Review of Systems  GENERAL: No change in appetite, no fatigue, no weight changes, no fever, chills or weakness MOUTH and THROAT: Denies oral discomfort, gingival pain  RESPIRATORY: no cough, SOB, DOE, wheezing, hemoptysis CARDIAC: No chest pain, edema or palpitations GI: No abdominal pain, diarrhea, constipation, heart burn, nausea or vomiting GU: Denies dysuria, frequency, hematuria, incontinence, or discharge NEUROLOGICAL: Denies dizziness, syncope or headache PSYCHIATRIC: Denies feelings of depression or anxiety. No report of hallucinations, insomnia, paranoia, or agitation   Immunization History  Administered Date(s) Administered  . Influenza-Unspecified 12/10/2017  . PFIZER(Purple Top)SARS-COV-2 Vaccination 04/02/2019, 04/25/2019   Pertinent  Health Maintenance Due  Topic Date Due  . COLONOSCOPY (Pts 45-68yrs Insurance coverage will need to be confirmed)  Never done  . PNA vac Low Risk Adult (1 of 2 - PCV13) Never done  . INFLUENZA VACCINE  09/09/2020   Fall  Risk  12/02/2018 11/30/2018  Falls in the past year? 0 1  Number falls in past yr: 0 0  Injury with Fall? 0 0     Vitals:   05/17/20 1115  BP: 131/85  Pulse: 89  Resp: 20  Temp: (!) 97 F (36.1 C)  SpO2: 98%  Weight: 181 lb 6.4 oz (82.3 kg)  Height: 5\' 8"  (1.727 m)   Body mass index is 27.58 kg/m.  Physical Exam  GENERAL APPEARANCE: Well nourished. In no acute distress.  SKIN:  Skin is warm and dry.  MOUTH and THROAT: Lips are without lesions. Oral mucosa is moist and without lesions.  RESPIRATORY: Breathing is even & unlabored, BS CTAB CARDIAC: RRR, no murmur,no extra heart sounds, no edema GI: Abdomen soft, normal BS, no masses, no tenderness NEUROLOGICAL: There is no tremor. Speech is clear. Alert and oriented X 3. Right leg weakness. PSYCHIATRIC: Affect and behavior are appropriate.   Labs  reviewed: Recent Labs    04/29/20 2015 04/30/20 0644 05/01/20 0531  NA 138 139 141  K 4.2 3.6 3.8  CL 102 106 108  CO2 23 23 24   GLUCOSE 144* 101* 98  BUN 16 16 14   CREATININE 1.40* 1.18 1.15  CALCIUM 9.2 8.6* 8.8*  MG  --  2.1  --   PHOS  --  2.5  --    Recent Labs    04/30/20 0644  AST 14*  ALT 9  ALKPHOS 85  BILITOT 0.9  PROT 6.5  ALBUMIN 3.6   Recent Labs    04/29/20 2015 04/30/20 0644  WBC 10.5 10.0  NEUTROABS 9.7*  --   HGB 15.6 13.4  HCT 48.2 41.0  MCV 90.3 91.1  PLT 195 190   Lab Results  Component Value Date   TSH 0.427 05/01/2020    Significant Diagnostic Results in last 30 days:  CT Head Wo Contrast  Result Date: 04/29/2020 CLINICAL DATA:  Fall, hit left side of head EXAM: CT HEAD WITHOUT CONTRAST TECHNIQUE: Contiguous axial images were obtained from the base of the skull through the vertex without intravenous contrast. COMPARISON:  03/07/2019 FINDINGS: Brain: There is atrophy and chronic small vessel disease changes. No acute intracranial abnormality. Specifically, no hemorrhage, hydrocephalus, mass lesion, acute infarction, or  significant intracranial injury. Vascular: No hyperdense vessel or unexpected calcification. Skull: No acute calvarial abnormality. Sinuses/Orbits: No acute findings Other: None IMPRESSION: Atrophy, chronic microvascular disease. No acute intracranial abnormality. Electronically Signed   By: Rolm Baptise M.D.   On: 04/29/2020 23:36   CT Renal Stone Study  Result Date: 04/29/2020 CLINICAL DATA:  70 year old male with flank pain. Concern for kidney stone. EXAM: CT ABDOMEN AND PELVIS WITHOUT CONTRAST TECHNIQUE: Multidetector CT imaging of the abdomen and pelvis was performed following the standard protocol without IV contrast. COMPARISON:  CT abdomen pelvis dated 03/07/2019. FINDINGS: Evaluation of this exam is limited in the absence of intravenous contrast. Lower chest: The visualized lung bases are clear. Coronary vascular calcifications noted. No intra-abdominal free air or free fluid. Hepatobiliary: The liver is unremarkable. The gallbladder is distended. No calcified gallstone or pericholecystic fluid. Pancreas: Unremarkable. No pancreatic ductal dilatation or surrounding inflammatory changes. Spleen: Normal in size without focal abnormality. Adrenals/Urinary Tract: The adrenal glands are unremarkable. There is a 10 cm right renal cyst. Several nonobstructing stones noted in the mid to lower pole of the right kidney. A 15 mm curvilinear calcification in the interpolar right kidney along the wall of the cyst, new since the prior CT, and likely a nonobstructing stone and less likely focal calcification of the cyst wall. No hydronephrosis. There is a 3 cm left renal inferior pole exophytic cyst. There is no hydronephrosis or nephrolithiasis on the left. The visualized ureters appear unremarkable. The urinary bladder is decompressed around a Foley catheter. Stomach/Bowel: There is moderate amount of stool throughout the colon with a large stool in the rectal vault. There is no bowel obstruction or active  inflammation. The appendix is normal. Vascular/Lymphatic: Moderate aortoiliac atherosclerotic disease. The IVC is unremarkable. No portal venous gas. There is no adenopathy. Reproductive: The prostate gland is enlarged measuring 6 cm in transverse axial diameter. The seminal vesicles are symmetric. Other: None Musculoskeletal: Osteopenia with degenerative changes of the spine. L4-S1 posterior fusion. No acute osseous pathology. Old T12 and L1 compression fractures. IMPRESSION: 1. Nonobstructing right renal stones. No hydronephrosis or obstructing stone. 2. Moderate colonic stool burden. No bowel obstruction. Normal appendix. 3. Aortic  Atherosclerosis (ICD10-I70.0). Electronically Signed   By: Anner Crete M.D.   On: 04/29/2020 21:45    Assessment/Plan  1. Urinary retention -  Foley catheter was discontinued -  Continue Flomax and Finasteride  2. Benign prostatic hyperplasia with urinary retention - finasteride (PROSCAR) 5 MG tablet; Take 1 tablet (5 mg total) by mouth daily.  Dispense: 30 tablet; Refill: 0 - tamsulosin (FLOMAX) 0.4 MG CAPS capsule; Take 1 capsule (0.4 mg total) by mouth every evening.  Dispense: 30 capsule; Refill: 0  3. Primary hypertension - diltiazem (CARDIZEM CD) 120 MG 24 hr capsule; Take 1 capsule (120 mg total) by mouth every evening.  Dispense: 30 capsule; Refill: 0  4. Chronic pain syndrome - oxyCODONE (ROXICODONE) 15 MG immediate release tablet; Take 1 tablet (15 mg total) by mouth every 6 (six) hours as needed for pain.  Dispense: 30 tablet; Refill: 0  5. Major depression, chronic - ARIPiprazole (ABILIFY) 5 MG tablet; Take 1 tablet (5 mg total) by mouth daily.  Dispense: 30 tablet; Refill: 0  6. Drug-induced constipation - polyethylene glycol (MIRALAX / GLYCOLAX) 17 g packet; Take 17 g by mouth 2 (two) times daily.  Dispense: 14 each; Refill: 3 - docusate sodium (COLACE) 100 MG capsule; Take 1 capsule (100 mg total) by mouth in the morning, at noon, and at  bedtime.  Dispense: 90 capsule; Refill: 0  7. Bladder spasm - tolterodine (DETROL) 1 MG tablet; Take 1 tablet (1 mg total) by mouth 2 (two) times daily.  Dispense: 60 tablet; Refill: 0 - mirabegron ER (MYRBETRIQ) 50 MG TB24 tablet; Take 1 tablet (50 mg total) by mouth daily.  Dispense: 30 tablet; Refill: 0  8. Primary insomnia - traZODone (DESYREL) 50 MG tablet; Take 1 tablet (50 mg total) by mouth at bedtime.  Dispense: 30 tablet; Refill: 0  9. Major depressive disorder, recurrent episode, moderate (HCC) - venlafaxine XR (EFFEXOR-XR) 75 MG 24 hr capsule; Take 1 capsule (75 mg total) by mouth in the morning and at bedtime.  Dispense: 60 capsule; Refill: 0 - ARIPiprazole (ABILIFY) 5 MG tablet; Take 1 tablet (5 mg total) by mouth daily.  Dispense: 30 tablet; Refill: 0  10. Insomnia, unspecified type - ARIPiprazole (ABILIFY) 5 MG tablet; Take 1 tablet (5 mg total) by mouth daily.  Dispense: 30 tablet; Refill: 0 - traZODone (DESYREL) 50 MG tablet; Take 1 tablet (50 mg total) by mouth at bedtime.  Dispense: 30 tablet; Refill: 0      I have filled out patient's discharge paperwork and e-prescribed medications.  Patient will have home health PT and OT.  DME provided:  None  Total discharge time: Greater than 30 minutes Greater than 50% was spent in counseling and coordination of care.   Discharge time involved coordination of the discharge process with social worker, nursing staff and therapy department. Medical justification for home health services verified.    Durenda Age, DNP, MSN, FNP-BC [facility] [phone] (Monday-Friday 8:00 a.m. - 5:00 p.m.) [phone] (after hours)

## 2020-05-17 NOTE — Progress Notes (Deleted)
Location:  [facility] Room Number: 211/A Place of Service:  SNF (31) Provider:  Durenda Age, DNP, FNP-BC  Patient Care Team: [name], MD as PCP - General (Internal Medicine) Alda Berthold, DO as Consulting Physician (Neurology)  Extended Emergency Contact Information Primary Emergency Contact: Rodkey,[name] Address: [address]          [place], [place] [address] [place] Phone: [phone] Mobile Phone: [phone] Relation: Brother  Code Status: Full Code   Goals of care: Advanced Directive information Advanced Directives 05/17/2020  Does Patient Have a Medical Advance Directive? Yes  Type of Paramedic of [place];Living will  Does patient want to make changes to medical advance directive? No - Patient declined  Copy of [place] in Chart? Yes - validated most recent copy scanned in chart (See row information)  Would patient like information on creating a medical advance directive? -  Pre-existing out of facility DNR order (yellow form or pink MOST form) -     Chief Complaint  Patient presents with  . Discharge Note    Discharge Visit    HPI:  Pt is a 70 y.o. male seen today for medical management of chronic diseases.     Past Medical History:  Diagnosis Date  . Arthritis   . Chronic back pain   . Chronic pain 1999   Bilateral feet (R >L)  . Difficult or painful urination   . High blood pressure   . History of kidney stones    H/O  . Nerve damage   . Pain management   . Paresthesia of foot, bilateral   . Sleep apnea    USES CPAP  . Weakness of both legs    Past Surgical History:  Procedure Laterality Date  . arm surgery    . BACK SURGERY     X2  . FACIAL COSMETIC SURGERY    . HERNIA REPAIR    . IR US GUIDE BX ASP/DRAIN  10/19/2017  . KIDNEY STONE SURGERY    . LUMBAR SPINAL CORD SIMULATOR LEAD REMOVAL N/A 03/01/2019   Procedure: LUMBAR SPINAL  CORD SIMULATOR LEAD REMOVAL;  Surgeon: Meade Maw, MD;  Location: [facility] ORS;  Service: Neurosurgery;  Laterality: N/A;  . MASTECTOMY Left   . SPINAL CORD STIMULATOR REMOVAL N/A 03/01/2019   Procedure: LUMBAR SPINAL CORD STIMULATOR REMOVAL;  Surgeon: Meade Maw, MD;  Location: [facility] ORS;  Service: Neurosurgery;  Laterality: N/A;    Allergies  Allergen Reactions  . Erythromycin Itching    Outpatient Encounter Medications as of 05/17/2020  Medication Sig  . ARIPiprazole (ABILIFY) 5 MG tablet Take 1 tablet (5 mg total) by mouth daily.  . cholecalciferol (VITAMIN D3) 25 MCG (1000 UNIT) tablet Take 1,000 Units by mouth daily.  Marland Kitchen diltiazem (CARDIZEM CD) 120 MG 24 hr capsule Take 120 mg by mouth every evening.   . docusate sodium (COLACE) 100 MG capsule Take 100 mg by mouth in the morning, at noon, and at bedtime.  . finasteride (PROSCAR) 5 MG tablet Take 5 mg by mouth daily.  . mirabegron ER (MYRBETRIQ) 50 MG TB24 tablet Take 50 mg by mouth daily.  Marland Kitchen oxyCODONE (ROXICODONE) 15 MG immediate release tablet Take 1 tablet (15 mg total) by mouth every 6 (six) hours as needed for pain.  . polyethylene glycol (MIRALAX / GLYCOLAX) 17 g packet Take 17 g by mouth 2 (two) times daily.  Marland Kitchen senna-docusate (SENOKOT-S) 8.6-50 MG tablet  Take 1 tablet by mouth 2 (two) times daily.  . tamsulosin (FLOMAX) 0.4 MG CAPS capsule Take 0.4 mg by mouth every evening.   . tolterodine (DETROL) 1 MG tablet Take 1 mg by mouth 2 (two) times daily.  . traZODone (DESYREL) 50 MG tablet Take 1 tablet (50 mg total) by mouth at bedtime.  Marland Kitchen venlafaxine XR (EFFEXOR-XR) 75 MG 24 hr capsule Take 75 mg by mouth in the morning and at bedtime.  . vitamin B-12 (CYANOCOBALAMIN) 500 MCG tablet Take 1 tablet (500 mcg total) by mouth daily.  . [DISCONTINUED] diazepam (VALIUM) 10 MG tablet Take 1 tablet (10 mg total) by mouth 2 (two) times daily as needed (restless legs).   No facility-administered encounter medications on file as of  05/17/2020.    Review of Systems  GENERAL: No change in appetite, no fatigue, no weight changes, no fever, chills or weakness SKIN: Denies rash, itching, wounds, ulcer sores, or nail abnormalities EYES: Denies change in vision, dry eyes, eye pain, itching or discharge EARS: Denies change in hearing, ringing in ears, or earache NOSE: Denies nasal congestion or epistaxis MOUTH and THROAT: Denies oral discomfort, gingival pain or bleeding, pain from teeth or hoarseness   RESPIRATORY: no cough, SOB, DOE, wheezing, hemoptysis CARDIAC: No chest pain, edema or palpitations GI: No abdominal pain, diarrhea, constipation, heart burn, nausea or vomiting GU: Denies dysuria, frequency, hematuria, incontinence, or discharge MUSCULOSKELETAL: Denies joint pain, muscle pain, back pain, restricted movement, or unusual weakness CIRCULATION: Denies claudication, edema of legs, varicosities, or cold extremities NEUROLOGICAL: Denies dizziness, syncope, numbness, or headache PSYCHIATRIC: Denies feelings of depression or anxiety. No report of hallucinations, insomnia, paranoia, or agitation ENDOCRINE: Denies polyphagia, polyuria, polydipsia, heat or cold intolerance HEME/LYMPH: Denies excessive bruising, petechia, enlarged lymph nodes, or bleeding problems IMMUNOLOGIC: Denies history of frequent infections, AIDS, or use of immunosuppressive agents   Immunization History  Administered Date(s) Administered  . Influenza-Unspecified 12/10/2017  . PFIZER(Purple Top)SARS-COV-2 Vaccination 04/02/2019, 04/25/2019   Pertinent  Health Maintenance Due  Topic Date Due  . COLONOSCOPY (Pts 45-41yrs Insurance coverage will need to be confirmed)  Never done  . PNA vac Low Risk Adult (1 of 2 - PCV13) Never done  . INFLUENZA VACCINE  09/09/2020   Fall Risk  12/02/2018 11/30/2018  Falls in the past year? 0 1  Number falls in past yr: 0 0  Injury with Fall? 0 0     Vitals:   05/17/20 1115  BP: 131/85  Pulse: 89   Resp: 20  Temp: (!) 97 F (36.1 C)  SpO2: 98%  Weight: 181 lb 6.4 oz (82.3 kg)  Height: 5\' 8"  (1.727 m)   Body mass index is 27.58 kg/m.  Physical Exam  GENERAL APPEARANCE: Well nourished. In no acute distress. Normal body habitus SKIN:  Skin is warm and dry. There are no suspicious lesions or rash HEAD: Normal in size and contour. No evidence of trauma EYES: Lids open and close normally. No blepharitis, entropion or ectropion. PERRL. Conjunctivae are clear and sclerae are white. Lenses are without opacity EARS: Pinnae are normal. Patient hears normal voice tunes of the examiner MOUTH and THROAT: Lips are without lesions. Oral mucosa is moist and without lesions. Tongue is normal in shape, size, and color and without lesions NECK: supple, trachea midline, no neck masses, no thyroid tenderness, no thyromegaly LYMPHATICS: No LAN in the neck, no supraclavicular LAN RESPIRATORY: Breathing is even & unlabored, BS CTAB CARDIAC: RRR, no murmur,no extra heart sounds, no  edema GI: Abdomen soft, normal BS, no masses, no tenderness, no hepatomegaly, no splenomegaly MUSCULOSKELETAL: No deformities. Movement at each extremity is full and painless. Strength is 5/5 at each extremity. Back is without kyphosis or scoliosis CIRCULATION: Pedal pulses are 2+. There is no edema of the legs, ankles and feet NEUROLOGICAL: There is no tremor. Speech is clear PSYCHIATRIC: Alert and oriented X 3. Affect and behavior are appropriate  Labs reviewed: Recent Labs    04/29/20 2015 04/30/20 0644 05/01/20 0531  NA 138 139 141  K 4.2 3.6 3.8  CL 102 106 108  CO2 23 23 24   GLUCOSE 144* 101* 98  BUN 16 16 14   CREATININE 1.40* 1.18 1.15  CALCIUM 9.2 8.6* 8.8*  MG  --  2.1  --   PHOS  --  2.5  --    Recent Labs    04/30/20 0644  AST 14*  ALT 9  ALKPHOS 85  BILITOT 0.9  PROT 6.5  ALBUMIN 3.6   Recent Labs    04/29/20 2015 04/30/20 0644  WBC 10.5 10.0  NEUTROABS 9.7*  --   HGB 15.6 13.4  HCT  48.2 41.0  MCV 90.3 91.1  PLT 195 190   Lab Results  Component Value Date   TSH 0.427 05/01/2020   No results found for: HGBA1C No results found for: CHOL, HDL, LDLCALC, LDLDIRECT, TRIG, CHOLHDL  Significant Diagnostic Results in last 30 days:  CT Head Wo Contrast  Result Date: 04/29/2020 CLINICAL DATA:  Fall, hit left side of head EXAM: CT HEAD WITHOUT CONTRAST TECHNIQUE: Contiguous axial images were obtained from the base of the skull through the vertex without intravenous contrast. COMPARISON:  03/07/2019 FINDINGS: Brain: There is atrophy and chronic small vessel disease changes. No acute intracranial abnormality. Specifically, no hemorrhage, hydrocephalus, mass lesion, acute infarction, or significant intracranial injury. Vascular: No hyperdense vessel or unexpected calcification. Skull: No acute calvarial abnormality. Sinuses/Orbits: No acute findings Other: None IMPRESSION: Atrophy, chronic microvascular disease. No acute intracranial abnormality. Electronically Signed   By: Rolm Baptise M.D.   On: 04/29/2020 23:36   CT Renal Stone Study  Result Date: 04/29/2020 CLINICAL DATA:  70 year old male with flank pain. Concern for kidney stone. EXAM: CT ABDOMEN AND PELVIS WITHOUT CONTRAST TECHNIQUE: Multidetector CT imaging of the abdomen and pelvis was performed following the standard protocol without IV contrast. COMPARISON:  CT abdomen pelvis dated 03/07/2019. FINDINGS: Evaluation of this exam is limited in the absence of intravenous contrast. Lower chest: The visualized lung bases are clear. Coronary vascular calcifications noted. No intra-abdominal free air or free fluid. Hepatobiliary: The liver is unremarkable. The gallbladder is distended. No calcified gallstone or pericholecystic fluid. Pancreas: Unremarkable. No pancreatic ductal dilatation or surrounding inflammatory changes. Spleen: Normal in size without focal abnormality. Adrenals/Urinary Tract: The adrenal glands are unremarkable.  There is a 10 cm right renal cyst. Several nonobstructing stones noted in the mid to lower pole of the right kidney. A 15 mm curvilinear calcification in the interpolar right kidney along the wall of the cyst, new since the prior CT, and likely a nonobstructing stone and less likely focal calcification of the cyst wall. No hydronephrosis. There is a 3 cm left renal inferior pole exophytic cyst. There is no hydronephrosis or nephrolithiasis on the left. The visualized ureters appear unremarkable. The urinary bladder is decompressed around a Foley catheter. Stomach/Bowel: There is moderate amount of stool throughout the colon with a large stool in the rectal vault. There is no bowel obstruction  or active inflammation. The appendix is normal. Vascular/Lymphatic: Moderate aortoiliac atherosclerotic disease. The IVC is unremarkable. No portal venous gas. There is no adenopathy. Reproductive: The prostate gland is enlarged measuring 6 cm in transverse axial diameter. The seminal vesicles are symmetric. Other: None Musculoskeletal: Osteopenia with degenerative changes of the spine. L4-S1 posterior fusion. No acute osseous pathology. Old T12 and L1 compression fractures. IMPRESSION: 1. Nonobstructing right renal stones. No hydronephrosis or obstructing stone. 2. Moderate colonic stool burden. No bowel obstruction. Normal appendix. 3. Aortic Atherosclerosis (ICD10-I70.0). Electronically Signed   By: Anner Crete M.D.   On: 04/29/2020 21:45    Assessment/Plan    Family/ staff Communication:   Labs/tests ordered:    Goals of care:      Durenda Age, DNP, MSN, FNP-BC [facility] [phone] (Monday-Friday 8:00 a.m. - 5:00 p.m.) [phone] (after hours)

## 2020-05-22 DIAGNOSIS — M5136 Other intervertebral disc degeneration, lumbar region: Secondary | ICD-10-CM | POA: Diagnosis not present

## 2020-05-22 DIAGNOSIS — G894 Chronic pain syndrome: Secondary | ICD-10-CM | POA: Diagnosis not present

## 2020-05-23 DIAGNOSIS — G2581 Restless legs syndrome: Secondary | ICD-10-CM | POA: Diagnosis not present

## 2020-05-23 DIAGNOSIS — M1991 Primary osteoarthritis, unspecified site: Secondary | ICD-10-CM | POA: Diagnosis not present

## 2020-05-23 DIAGNOSIS — R600 Localized edema: Secondary | ICD-10-CM | POA: Diagnosis not present

## 2020-05-23 DIAGNOSIS — I10 Essential (primary) hypertension: Secondary | ICD-10-CM | POA: Diagnosis not present

## 2020-05-23 DIAGNOSIS — R202 Paresthesia of skin: Secondary | ICD-10-CM | POA: Diagnosis not present

## 2020-05-23 DIAGNOSIS — F32A Depression, unspecified: Secondary | ICD-10-CM | POA: Diagnosis not present

## 2020-05-23 DIAGNOSIS — M21371 Foot drop, right foot: Secondary | ICD-10-CM | POA: Diagnosis not present

## 2020-05-23 DIAGNOSIS — Z79891 Long term (current) use of opiate analgesic: Secondary | ICD-10-CM | POA: Diagnosis not present

## 2020-05-23 DIAGNOSIS — R338 Other retention of urine: Secondary | ICD-10-CM | POA: Diagnosis not present

## 2020-05-23 DIAGNOSIS — G894 Chronic pain syndrome: Secondary | ICD-10-CM | POA: Diagnosis not present

## 2020-05-23 DIAGNOSIS — Z9181 History of falling: Secondary | ICD-10-CM | POA: Diagnosis not present

## 2020-05-23 DIAGNOSIS — E785 Hyperlipidemia, unspecified: Secondary | ICD-10-CM | POA: Diagnosis not present

## 2020-05-23 DIAGNOSIS — G4730 Sleep apnea, unspecified: Secondary | ICD-10-CM | POA: Diagnosis not present

## 2020-05-23 DIAGNOSIS — G629 Polyneuropathy, unspecified: Secondary | ICD-10-CM | POA: Diagnosis not present

## 2020-05-28 DIAGNOSIS — R600 Localized edema: Secondary | ICD-10-CM | POA: Diagnosis not present

## 2020-05-28 DIAGNOSIS — Z9181 History of falling: Secondary | ICD-10-CM | POA: Diagnosis not present

## 2020-05-28 DIAGNOSIS — G4730 Sleep apnea, unspecified: Secondary | ICD-10-CM | POA: Diagnosis not present

## 2020-05-28 DIAGNOSIS — M21371 Foot drop, right foot: Secondary | ICD-10-CM | POA: Diagnosis not present

## 2020-05-28 DIAGNOSIS — F32A Depression, unspecified: Secondary | ICD-10-CM | POA: Diagnosis not present

## 2020-05-28 DIAGNOSIS — G2581 Restless legs syndrome: Secondary | ICD-10-CM | POA: Diagnosis not present

## 2020-05-28 DIAGNOSIS — I10 Essential (primary) hypertension: Secondary | ICD-10-CM | POA: Diagnosis not present

## 2020-05-28 DIAGNOSIS — E785 Hyperlipidemia, unspecified: Secondary | ICD-10-CM | POA: Diagnosis not present

## 2020-05-28 DIAGNOSIS — G629 Polyneuropathy, unspecified: Secondary | ICD-10-CM | POA: Diagnosis not present

## 2020-05-28 DIAGNOSIS — G894 Chronic pain syndrome: Secondary | ICD-10-CM | POA: Diagnosis not present

## 2020-05-28 DIAGNOSIS — R202 Paresthesia of skin: Secondary | ICD-10-CM | POA: Diagnosis not present

## 2020-05-28 DIAGNOSIS — Z79891 Long term (current) use of opiate analgesic: Secondary | ICD-10-CM | POA: Diagnosis not present

## 2020-05-28 DIAGNOSIS — R338 Other retention of urine: Secondary | ICD-10-CM | POA: Diagnosis not present

## 2020-05-28 DIAGNOSIS — M1991 Primary osteoarthritis, unspecified site: Secondary | ICD-10-CM | POA: Diagnosis not present

## 2020-05-31 DIAGNOSIS — R600 Localized edema: Secondary | ICD-10-CM | POA: Diagnosis not present

## 2020-05-31 DIAGNOSIS — G629 Polyneuropathy, unspecified: Secondary | ICD-10-CM | POA: Diagnosis not present

## 2020-05-31 DIAGNOSIS — G894 Chronic pain syndrome: Secondary | ICD-10-CM | POA: Diagnosis not present

## 2020-05-31 DIAGNOSIS — M21371 Foot drop, right foot: Secondary | ICD-10-CM | POA: Diagnosis not present

## 2020-05-31 DIAGNOSIS — G4730 Sleep apnea, unspecified: Secondary | ICD-10-CM | POA: Diagnosis not present

## 2020-05-31 DIAGNOSIS — R338 Other retention of urine: Secondary | ICD-10-CM | POA: Diagnosis not present

## 2020-05-31 DIAGNOSIS — Z9181 History of falling: Secondary | ICD-10-CM | POA: Diagnosis not present

## 2020-05-31 DIAGNOSIS — F32A Depression, unspecified: Secondary | ICD-10-CM | POA: Diagnosis not present

## 2020-05-31 DIAGNOSIS — I10 Essential (primary) hypertension: Secondary | ICD-10-CM | POA: Diagnosis not present

## 2020-05-31 DIAGNOSIS — R202 Paresthesia of skin: Secondary | ICD-10-CM | POA: Diagnosis not present

## 2020-05-31 DIAGNOSIS — Z79891 Long term (current) use of opiate analgesic: Secondary | ICD-10-CM | POA: Diagnosis not present

## 2020-05-31 DIAGNOSIS — G2581 Restless legs syndrome: Secondary | ICD-10-CM | POA: Diagnosis not present

## 2020-05-31 DIAGNOSIS — M1991 Primary osteoarthritis, unspecified site: Secondary | ICD-10-CM | POA: Diagnosis not present

## 2020-05-31 DIAGNOSIS — E785 Hyperlipidemia, unspecified: Secondary | ICD-10-CM | POA: Diagnosis not present

## 2020-06-03 DIAGNOSIS — F32A Depression, unspecified: Secondary | ICD-10-CM | POA: Diagnosis not present

## 2020-06-03 DIAGNOSIS — G894 Chronic pain syndrome: Secondary | ICD-10-CM | POA: Diagnosis not present

## 2020-06-03 DIAGNOSIS — M1991 Primary osteoarthritis, unspecified site: Secondary | ICD-10-CM | POA: Diagnosis not present

## 2020-06-03 DIAGNOSIS — R202 Paresthesia of skin: Secondary | ICD-10-CM | POA: Diagnosis not present

## 2020-06-03 DIAGNOSIS — G629 Polyneuropathy, unspecified: Secondary | ICD-10-CM | POA: Diagnosis not present

## 2020-06-03 DIAGNOSIS — Z79891 Long term (current) use of opiate analgesic: Secondary | ICD-10-CM | POA: Diagnosis not present

## 2020-06-03 DIAGNOSIS — G2581 Restless legs syndrome: Secondary | ICD-10-CM | POA: Diagnosis not present

## 2020-06-03 DIAGNOSIS — Z9181 History of falling: Secondary | ICD-10-CM | POA: Diagnosis not present

## 2020-06-03 DIAGNOSIS — R600 Localized edema: Secondary | ICD-10-CM | POA: Diagnosis not present

## 2020-06-03 DIAGNOSIS — E785 Hyperlipidemia, unspecified: Secondary | ICD-10-CM | POA: Diagnosis not present

## 2020-06-03 DIAGNOSIS — M21371 Foot drop, right foot: Secondary | ICD-10-CM | POA: Diagnosis not present

## 2020-06-03 DIAGNOSIS — I10 Essential (primary) hypertension: Secondary | ICD-10-CM | POA: Diagnosis not present

## 2020-06-03 DIAGNOSIS — G4730 Sleep apnea, unspecified: Secondary | ICD-10-CM | POA: Diagnosis not present

## 2020-06-03 DIAGNOSIS — R338 Other retention of urine: Secondary | ICD-10-CM | POA: Diagnosis not present

## 2020-06-07 DIAGNOSIS — R338 Other retention of urine: Secondary | ICD-10-CM | POA: Diagnosis not present

## 2020-06-07 DIAGNOSIS — M21371 Foot drop, right foot: Secondary | ICD-10-CM | POA: Diagnosis not present

## 2020-06-07 DIAGNOSIS — F32A Depression, unspecified: Secondary | ICD-10-CM | POA: Diagnosis not present

## 2020-06-07 DIAGNOSIS — I10 Essential (primary) hypertension: Secondary | ICD-10-CM | POA: Diagnosis not present

## 2020-06-07 DIAGNOSIS — E785 Hyperlipidemia, unspecified: Secondary | ICD-10-CM | POA: Diagnosis not present

## 2020-06-07 DIAGNOSIS — G2581 Restless legs syndrome: Secondary | ICD-10-CM | POA: Diagnosis not present

## 2020-06-07 DIAGNOSIS — G629 Polyneuropathy, unspecified: Secondary | ICD-10-CM | POA: Diagnosis not present

## 2020-06-07 DIAGNOSIS — Z79891 Long term (current) use of opiate analgesic: Secondary | ICD-10-CM | POA: Diagnosis not present

## 2020-06-07 DIAGNOSIS — M1991 Primary osteoarthritis, unspecified site: Secondary | ICD-10-CM | POA: Diagnosis not present

## 2020-06-07 DIAGNOSIS — R202 Paresthesia of skin: Secondary | ICD-10-CM | POA: Diagnosis not present

## 2020-06-07 DIAGNOSIS — G4730 Sleep apnea, unspecified: Secondary | ICD-10-CM | POA: Diagnosis not present

## 2020-06-07 DIAGNOSIS — Z9181 History of falling: Secondary | ICD-10-CM | POA: Diagnosis not present

## 2020-06-07 DIAGNOSIS — G894 Chronic pain syndrome: Secondary | ICD-10-CM | POA: Diagnosis not present

## 2020-06-07 DIAGNOSIS — R600 Localized edema: Secondary | ICD-10-CM | POA: Diagnosis not present

## 2020-06-10 DIAGNOSIS — G629 Polyneuropathy, unspecified: Secondary | ICD-10-CM | POA: Diagnosis not present

## 2020-06-10 DIAGNOSIS — R600 Localized edema: Secondary | ICD-10-CM | POA: Diagnosis not present

## 2020-06-10 DIAGNOSIS — F32A Depression, unspecified: Secondary | ICD-10-CM | POA: Diagnosis not present

## 2020-06-10 DIAGNOSIS — G4730 Sleep apnea, unspecified: Secondary | ICD-10-CM | POA: Diagnosis not present

## 2020-06-10 DIAGNOSIS — G2581 Restless legs syndrome: Secondary | ICD-10-CM | POA: Diagnosis not present

## 2020-06-10 DIAGNOSIS — I10 Essential (primary) hypertension: Secondary | ICD-10-CM | POA: Diagnosis not present

## 2020-06-10 DIAGNOSIS — R338 Other retention of urine: Secondary | ICD-10-CM | POA: Diagnosis not present

## 2020-06-10 DIAGNOSIS — R202 Paresthesia of skin: Secondary | ICD-10-CM | POA: Diagnosis not present

## 2020-06-10 DIAGNOSIS — M21371 Foot drop, right foot: Secondary | ICD-10-CM | POA: Diagnosis not present

## 2020-06-10 DIAGNOSIS — Z9181 History of falling: Secondary | ICD-10-CM | POA: Diagnosis not present

## 2020-06-10 DIAGNOSIS — Z79891 Long term (current) use of opiate analgesic: Secondary | ICD-10-CM | POA: Diagnosis not present

## 2020-06-10 DIAGNOSIS — G894 Chronic pain syndrome: Secondary | ICD-10-CM | POA: Diagnosis not present

## 2020-06-10 DIAGNOSIS — E785 Hyperlipidemia, unspecified: Secondary | ICD-10-CM | POA: Diagnosis not present

## 2020-06-10 DIAGNOSIS — M1991 Primary osteoarthritis, unspecified site: Secondary | ICD-10-CM | POA: Diagnosis not present

## 2020-06-12 DIAGNOSIS — R3915 Urgency of urination: Secondary | ICD-10-CM | POA: Diagnosis not present

## 2020-06-12 DIAGNOSIS — R3914 Feeling of incomplete bladder emptying: Secondary | ICD-10-CM | POA: Diagnosis not present

## 2020-06-12 DIAGNOSIS — N3000 Acute cystitis without hematuria: Secondary | ICD-10-CM | POA: Diagnosis not present

## 2020-06-13 DIAGNOSIS — R300 Dysuria: Secondary | ICD-10-CM | POA: Diagnosis not present

## 2020-06-13 DIAGNOSIS — G4730 Sleep apnea, unspecified: Secondary | ICD-10-CM | POA: Diagnosis not present

## 2020-06-13 DIAGNOSIS — Z9181 History of falling: Secondary | ICD-10-CM | POA: Diagnosis not present

## 2020-06-13 DIAGNOSIS — G894 Chronic pain syndrome: Secondary | ICD-10-CM | POA: Diagnosis not present

## 2020-06-13 DIAGNOSIS — R600 Localized edema: Secondary | ICD-10-CM | POA: Diagnosis not present

## 2020-06-13 DIAGNOSIS — G2581 Restless legs syndrome: Secondary | ICD-10-CM | POA: Diagnosis not present

## 2020-06-13 DIAGNOSIS — E785 Hyperlipidemia, unspecified: Secondary | ICD-10-CM | POA: Diagnosis not present

## 2020-06-13 DIAGNOSIS — W19XXXA Unspecified fall, initial encounter: Secondary | ICD-10-CM | POA: Diagnosis not present

## 2020-06-13 DIAGNOSIS — Z79891 Long term (current) use of opiate analgesic: Secondary | ICD-10-CM | POA: Diagnosis not present

## 2020-06-13 DIAGNOSIS — I10 Essential (primary) hypertension: Secondary | ICD-10-CM | POA: Diagnosis not present

## 2020-06-13 DIAGNOSIS — M21371 Foot drop, right foot: Secondary | ICD-10-CM | POA: Diagnosis not present

## 2020-06-13 DIAGNOSIS — M1991 Primary osteoarthritis, unspecified site: Secondary | ICD-10-CM | POA: Diagnosis not present

## 2020-06-13 DIAGNOSIS — R69 Illness, unspecified: Secondary | ICD-10-CM | POA: Diagnosis not present

## 2020-06-13 DIAGNOSIS — G629 Polyneuropathy, unspecified: Secondary | ICD-10-CM | POA: Diagnosis not present

## 2020-06-13 DIAGNOSIS — F32A Depression, unspecified: Secondary | ICD-10-CM | POA: Diagnosis not present

## 2020-06-13 DIAGNOSIS — R338 Other retention of urine: Secondary | ICD-10-CM | POA: Diagnosis not present

## 2020-06-13 DIAGNOSIS — R202 Paresthesia of skin: Secondary | ICD-10-CM | POA: Diagnosis not present

## 2020-06-14 DIAGNOSIS — G2581 Restless legs syndrome: Secondary | ICD-10-CM | POA: Diagnosis not present

## 2020-06-14 DIAGNOSIS — G4730 Sleep apnea, unspecified: Secondary | ICD-10-CM | POA: Diagnosis not present

## 2020-06-14 DIAGNOSIS — Z79891 Long term (current) use of opiate analgesic: Secondary | ICD-10-CM | POA: Diagnosis not present

## 2020-06-14 DIAGNOSIS — M1991 Primary osteoarthritis, unspecified site: Secondary | ICD-10-CM | POA: Diagnosis not present

## 2020-06-14 DIAGNOSIS — I10 Essential (primary) hypertension: Secondary | ICD-10-CM | POA: Diagnosis not present

## 2020-06-14 DIAGNOSIS — Z9181 History of falling: Secondary | ICD-10-CM | POA: Diagnosis not present

## 2020-06-14 DIAGNOSIS — G894 Chronic pain syndrome: Secondary | ICD-10-CM | POA: Diagnosis not present

## 2020-06-14 DIAGNOSIS — R202 Paresthesia of skin: Secondary | ICD-10-CM | POA: Diagnosis not present

## 2020-06-14 DIAGNOSIS — R338 Other retention of urine: Secondary | ICD-10-CM | POA: Diagnosis not present

## 2020-06-14 DIAGNOSIS — R600 Localized edema: Secondary | ICD-10-CM | POA: Diagnosis not present

## 2020-06-14 DIAGNOSIS — F32A Depression, unspecified: Secondary | ICD-10-CM | POA: Diagnosis not present

## 2020-06-14 DIAGNOSIS — E785 Hyperlipidemia, unspecified: Secondary | ICD-10-CM | POA: Diagnosis not present

## 2020-06-14 DIAGNOSIS — G629 Polyneuropathy, unspecified: Secondary | ICD-10-CM | POA: Diagnosis not present

## 2020-06-14 DIAGNOSIS — M21371 Foot drop, right foot: Secondary | ICD-10-CM | POA: Diagnosis not present

## 2020-06-17 ENCOUNTER — Emergency Department (HOSPITAL_COMMUNITY): Payer: Medicare Other

## 2020-06-17 ENCOUNTER — Other Ambulatory Visit: Payer: Self-pay

## 2020-06-17 ENCOUNTER — Encounter (HOSPITAL_COMMUNITY): Payer: Self-pay

## 2020-06-17 ENCOUNTER — Emergency Department (HOSPITAL_COMMUNITY)
Admission: EM | Admit: 2020-06-17 | Discharge: 2020-06-17 | Disposition: A | Discharge status: Home / Self Care | Payer: Medicare Other | Attending: Emergency Medicine | Admitting: Emergency Medicine

## 2020-06-17 ENCOUNTER — Emergency Department (HOSPITAL_BASED_OUTPATIENT_CLINIC_OR_DEPARTMENT_OTHER): Payer: Medicare Other

## 2020-06-17 DIAGNOSIS — M7989 Other specified soft tissue disorders: Secondary | ICD-10-CM

## 2020-06-17 DIAGNOSIS — R600 Localized edema: Secondary | ICD-10-CM | POA: Insufficient documentation

## 2020-06-17 DIAGNOSIS — Z87891 Personal history of nicotine dependence: Secondary | ICD-10-CM | POA: Insufficient documentation

## 2020-06-17 DIAGNOSIS — I10 Essential (primary) hypertension: Secondary | ICD-10-CM | POA: Insufficient documentation

## 2020-06-17 DIAGNOSIS — R2243 Localized swelling, mass and lump, lower limb, bilateral: Secondary | ICD-10-CM | POA: Insufficient documentation

## 2020-06-17 DIAGNOSIS — M545 Low back pain, unspecified: Secondary | ICD-10-CM | POA: Insufficient documentation

## 2020-06-17 DIAGNOSIS — G8929 Other chronic pain: Secondary | ICD-10-CM | POA: Diagnosis not present

## 2020-06-17 DIAGNOSIS — R339 Retention of urine, unspecified: Secondary | ICD-10-CM | POA: Diagnosis not present

## 2020-06-17 DIAGNOSIS — W19XXXA Unspecified fall, initial encounter: Secondary | ICD-10-CM | POA: Insufficient documentation

## 2020-06-17 DIAGNOSIS — R531 Weakness: Secondary | ICD-10-CM | POA: Insufficient documentation

## 2020-06-17 HISTORY — DX: Disorder of kidney and ureter, unspecified: N28.9

## 2020-06-17 LAB — COMPREHENSIVE METABOLIC PANEL
ALT: 10 U/L (ref 0–44)
AST: 16 U/L (ref 15–41)
Albumin: 4.4 g/dL (ref 3.5–5.0)
Alkaline Phosphatase: 86 U/L (ref 38–126)
Anion gap: 10 (ref 5–15)
BUN: 19 mg/dL (ref 8–23)
CO2: 28 mmol/L (ref 22–32)
Calcium: 10.1 mg/dL (ref 8.9–10.3)
Chloride: 104 mmol/L (ref 98–111)
Creatinine, Ser: 1.2 mg/dL (ref 0.61–1.24)
GFR, Estimated: >60 mL/min (ref >60)
Glucose, Bld: 109 mg/dL — ABNORMAL HIGH (ref 70–99)
Potassium: 4.3 mmol/L (ref 3.5–5.1)
Sodium: 142 mmol/L (ref 135–145)
Total Bilirubin: 0.7 mg/dL (ref 0.3–1.2)
Total Protein: 8.4 g/dL — ABNORMAL HIGH (ref 6.5–8.1)

## 2020-06-17 LAB — CBC WITH DIFFERENTIAL/PLATELET
Abs Immature Granulocytes: 0.02 10*3/uL (ref 0.00–0.07)
Basophils Absolute: 0 10*3/uL (ref 0.0–0.1)
Basophils Relative: 1 %
Eosinophils Absolute: 0.1 10*3/uL (ref 0.0–0.5)
Eosinophils Relative: 2 %
HCT: 45 % (ref 39.0–52.0)
Hemoglobin: 14.1 g/dL (ref 13.0–17.0)
Immature Granulocytes: 0 %
Lymphocytes Relative: 19 %
Lymphs Abs: 1.1 10*3/uL (ref 0.7–4.0)
MCH: 28.4 pg (ref 26.0–34.0)
MCHC: 31.3 g/dL (ref 30.0–36.0)
MCV: 90.7 fL (ref 80.0–100.0)
Monocytes Absolute: 0.3 10*3/uL (ref 0.1–1.0)
Monocytes Relative: 5 %
Neutro Abs: 4.3 10*3/uL (ref 1.7–7.7)
Neutrophils Relative %: 73 %
Platelets: 208 10*3/uL (ref 150–400)
RBC: 4.96 MIL/uL (ref 4.22–5.81)
RDW: 12.6 % (ref 11.5–15.5)
WBC: 5.9 10*3/uL (ref 4.0–10.5)
nRBC: 0 % (ref 0.0–0.2)

## 2020-06-17 LAB — URINALYSIS, ROUTINE W REFLEX MICROSCOPIC
Bilirubin Urine: NEGATIVE
Glucose, UA: NEGATIVE mg/dL
Hgb urine dipstick: NEGATIVE
Ketones, ur: 5 mg/dL — AB
Leukocytes,Ua: NEGATIVE
Nitrite: NEGATIVE
Protein, ur: NEGATIVE mg/dL
Specific Gravity, Urine: 1.013 (ref 1.005–1.030)
pH: 5 (ref 5.0–8.0)

## 2020-06-17 LAB — BRAIN NATRIURETIC PEPTIDE: B Natriuretic Peptide: 31.2 pg/mL (ref 0.0–100.0)

## 2020-06-17 NOTE — Progress Notes (Signed)
BLE venous duplex has been completed.  Preliminary results given to Benedetto Goad, Christine at 1248.  Results can be found under chart review under CV PROC. 06/17/2020 1:03 PM Cartez Mogle RVT, RDMS

## 2020-06-17 NOTE — Discharge Planning (Signed)
Pt currently active with [facility] for Home Health services as confirmed by [facility] with Perrin Smack of [place] and Legrand Como of [facility].

## 2020-06-17 NOTE — ED Provider Notes (Signed)
[facility] DEPT Provider Note   CSN: [id number] Arrival date & time: 06/17/20  1029     History Chief Complaint  Patient presents with  . Fall  . Leg Swelling    Luke Foster is a 70 y.o. male.  HPI Patient presents for primary reason of weakness in the legs which is gradually worse over several months.  Also he fell several days ago.  In the last week he has had increasing swelling in the legs, much greater than usual.  Patient is debilitated from chronic back pain.  He is currently taking antibiotics for UTI, and apparently has recurrent UTIs.  No recent fever.  He is not having cough or chest pain.  He is here with his brother who assists him closely, with care needs, and is his POA.  He also gives most of the history.  There are no other known active modifying factors.  Past Medical History:  Diagnosis Date  . Arthritis   . Chronic back pain   . Chronic pain 1999   Bilateral feet (R >L)  . Difficult or painful urination   . High blood pressure   . History of kidney stones    H/O  . Nerve damage   . Pain management   . Paresthesia of foot, bilateral   . Renal disorder   . Sleep apnea    USES CPAP  . Weakness of both legs     Patient Active Problem List   Diagnosis Date Noted  . Generalized weakness 04/30/2020  . Constipation 04/30/2020  . Nephrolithiasis 04/30/2020  . Pressure injury of skin 04/30/2020  . Acute urinary retention 04/29/2020  . Pain in limb 05/09/2019  . Acute metabolic encephalopathy 54/62/7035  . Bilateral hydronephrosis 03/07/2019  . Altered mental status 03/07/2019  . Hypertension   . Abnormal ECG 02/28/2019  . Atherosclerosis of abdominal aorta ([place]) 02/28/2019  . Benign essential HTN 02/28/2019  . Hyperlipidemia, mixed 02/28/2019  . Neuropathy 01/21/2017  . Pain in right foot 01/21/2017  . Pain in left foot 01/21/2017  . Chronic pain syndrome 01/21/2017  . Post laminectomy syndrome 01/21/2017  . Status  post insertion of spinal cord stimulator 01/21/2017  . Uncomplicated opioid dependence ([place]) 01/21/2017  . Chronic bilateral low back pain without sciatica 11/27/2015  . Degenerative disc disease, lumbar 06/17/2015  . Encounter for therapeutic drug monitoring 06/17/2015  . Nervous system device, implant, or graft complication, initial encounter 04/06/2014  . Lumbosacral radiculopathy 12/09/2012    Past Surgical History:  Procedure Laterality Date  . arm surgery    . BACK SURGERY     X2  . FACIAL COSMETIC SURGERY    . HERNIA REPAIR    . IR US GUIDE BX ASP/DRAIN  10/19/2017  . KIDNEY STONE SURGERY    . LUMBAR SPINAL CORD SIMULATOR LEAD REMOVAL N/A 03/01/2019   Procedure: LUMBAR SPINAL CORD SIMULATOR LEAD REMOVAL;  Surgeon: Meade Maw, MD;  Location: [facility] ORS;  Service: Neurosurgery;  Laterality: N/A;  . MASTECTOMY Left   . SPINAL CORD STIMULATOR REMOVAL N/A 03/01/2019   Procedure: LUMBAR SPINAL CORD STIMULATOR REMOVAL;  Surgeon: Meade Maw, MD;  Location: [facility] ORS;  Service: Neurosurgery;  Laterality: N/A;       Family History  Problem Relation Age of Onset  . Heart disease Father        Living, 75  . Breast cancer Mother        Living, 1  . Hypercholesterolemia Mother   .  Healthy Brother   . Healthy Sister     Social History   Tobacco Use  . Smoking status: Former Smoker    Packs/day: 2.00    Years: 10.00    Pack years: 20.00    Types: Cigarettes    Quit date: 05/21/1991    Years since quitting: 29.0  . Smokeless tobacco: Former Systems developer    Quit date: 06/08/1991  Vaping Use  . Vaping Use: Never used  Substance Use Topics  . Alcohol use: No  . Drug use: No    Home Medications Prior to Admission medications   Medication Sig Start Date End Date Taking? Authorizing Provider  ARIPiprazole (ABILIFY) 5 MG tablet Take 1 tablet (5 mg total) by mouth daily. 05/17/20   Medina-Vargas, Monina C, NP  cholecalciferol (VITAMIN D3) 25 MCG (1000 UNIT) tablet Take 1  tablet (1,000 Units total) by mouth daily. 05/17/20   Medina-Vargas, Monina C, NP  diltiazem (CARDIZEM CD) 120 MG 24 hr capsule Take 1 capsule (120 mg total) by mouth every evening. 05/17/20   Medina-Vargas, Monina C, NP  docusate sodium (COLACE) 100 MG capsule Take 1 capsule (100 mg total) by mouth in the morning, at noon, and at bedtime. 05/17/20   Medina-Vargas, Monina C, NP  finasteride (PROSCAR) 5 MG tablet Take 1 tablet (5 mg total) by mouth daily. 05/17/20   Medina-Vargas, Monina C, NP  mirabegron ER (MYRBETRIQ) 50 MG TB24 tablet Take 1 tablet (50 mg total) by mouth daily. 05/17/20   Medina-Vargas, Monina C, NP  oxyCODONE (ROXICODONE) 15 MG immediate release tablet Take 1 tablet (15 mg total) by mouth every 6 (six) hours as needed for pain. 05/17/20   Medina-Vargas, Monina C, NP  polyethylene glycol (MIRALAX / GLYCOLAX) 17 g packet Take 17 g by mouth 2 (two) times daily. 05/17/20   Medina-Vargas, Monina C, NP  senna-docusate (SENOKOT-S) 8.6-50 MG tablet Take 1 tablet by mouth 2 (two) times daily. 05/02/20   Georgette Shell, MD  tamsulosin (FLOMAX) 0.4 MG CAPS capsule Take 1 capsule (0.4 mg total) by mouth every evening. 05/17/20   Medina-Vargas, Monina C, NP  tolterodine (DETROL) 1 MG tablet Take 1 tablet (1 mg total) by mouth 2 (two) times daily. 05/17/20   Medina-Vargas, Monina C, NP  traZODone (DESYREL) 50 MG tablet Take 1 tablet (50 mg total) by mouth at bedtime. 05/17/20   Medina-Vargas, Monina C, NP  venlafaxine XR (EFFEXOR-XR) 75 MG 24 hr capsule Take 1 capsule (75 mg total) by mouth in the morning and at bedtime. 05/17/20   Medina-Vargas, Monina C, NP  vitamin B-12 (CYANOCOBALAMIN) 500 MCG tablet Take 1 tablet (500 mcg total) by mouth daily. 05/17/20   Medina-Vargas, Monina C, NP    Allergies    Erythromycin  Review of Systems   Review of Systems  All other systems reviewed and are negative.   Physical Exam Updated Vital Signs BP (!) 144/85   Pulse 61   Temp 98 F (36.7 C) (Oral)   Resp 20    Ht 5\' 8"  (1.727 m)   Wt 79.4 kg   SpO2 98%   BMI 26.61 kg/m   Physical Exam Vitals and nursing note reviewed.  Constitutional:      General: He is not in acute distress.    Appearance: He is well-developed. He is ill-appearing. He is not toxic-appearing or diaphoretic.     Comments: Frail  HENT:     Head: Normocephalic and atraumatic.     Right Ear: External  ear normal.     Left Ear: External ear normal.  Eyes:     Conjunctiva/sclera: Conjunctivae normal.     Pupils: Pupils are equal, round, and reactive to light.  Neck:     Trachea: Phonation normal.  Cardiovascular:     Rate and Rhythm: Normal rate and regular rhythm.     Heart sounds: Normal heart sounds.  Pulmonary:     Effort: Pulmonary effort is normal.     Breath sounds: Normal breath sounds.  Abdominal:     General: There is no distension.     Palpations: Abdomen is soft.     Tenderness: There is no abdominal tenderness.  Musculoskeletal:        General: No tenderness. Normal range of motion.     Cervical back: Normal range of motion and neck supple.     Right lower leg: Edema present.     Left lower leg: Edema present.     Comments: 3+ bilateral lower extremity edema  Skin:    General: Skin is warm and dry.     Comments: Stage I pressure sore left upper buttock with mild surrounding erythema.  Area involved is around 10 cm total.  Neurological:     Mental Status: He is alert and oriented to person, place, and time.     Cranial Nerves: No cranial nerve deficit.     Motor: No abnormal muscle tone.     Coordination: Coordination normal.  Psychiatric:        Mood and Affect: Mood normal.        Behavior: Behavior normal.     ED Results / Procedures / Treatments   Labs (all labs ordered are listed, but only abnormal results are displayed) Labs Reviewed  COMPREHENSIVE METABOLIC PANEL - Abnormal; Notable for the following components:      Result Value   Glucose, Bld 109 (*)    Total Protein 8.4 (*)    All  other components within normal limits  URINALYSIS, ROUTINE W REFLEX MICROSCOPIC - Abnormal; Notable for the following components:   Ketones, ur 5 (*)    All other components within normal limits  URINE CULTURE  CBC WITH DIFFERENTIAL/PLATELET  BRAIN NATRIURETIC PEPTIDE    EKG EKG Interpretation  Date/Time:  Monday Jun 17 2020 11:36:46 EDT Ventricular Rate:  64 PR Interval:  154 QRS Duration: 140 QT Interval:  450 QTC Calculation: 465 R Axis:   51 Text Interpretation: Sinus rhythm Right bundle branch block Lateral infarct, age indeterminate Baseline wander in lead(s) II III aVF V5 V6 since last tracing no significant change Confirmed by Daleen Bo [id number]) on 06/17/2020 1:21:56 PM   Radiology DG Chest Port 1 View  Result Date: 06/17/2020 CLINICAL DATA:  Bilateral lower extremity swelling and weakness, right greater than left. History of recent UTI. EXAM: PORTABLE CHEST 1 VIEW COMPARISON:  04/26/2019 chest radiograph. FINDINGS: Stable cardiomediastinal silhouette with normal heart size. No pneumothorax. No pleural effusion. Lungs appear clear, with no acute consolidative airspace disease and no pulmonary edema. IMPRESSION: No active disease. Electronically Signed   By: Ilona Sorrel M.D.   On: 06/17/2020 13:31   VAS [place] LOWER EXTREMITY VENOUS (DVT) ([facility] and [facility] 7a-7p)  Result Date: 06/17/2020  Lower Venous DVT Study Patient Name:  JACOBE STUDY  Date of Exam:   06/17/2020 Medical Rec #: [id number]      Accession #:    [id number] Date of Birth: [birth year]      Patient Gender: M Patient Age:  70Y Exam Location:  [facility] Procedure:      VAS [place] LOWER EXTREMITY VENOUS (DVT) Referring Phys: [id number] --------------------------------------------------------------------------------  Indications: Swelling.  Risk Factors: RLE venous reflux. Comparison Study: Previous exam 05/2017 - SVT in RLE GSV (calf) Performing Technologist: Rogelia Rohrer RVT, RDMS  Examination Guidelines: A complete  evaluation includes B-mode imaging, spectral Doppler, color Doppler, and power Doppler as needed of all accessible portions of each vessel. Bilateral testing is considered an integral part of a complete examination. Limited examinations for reoccurring indications may be performed as noted. The reflux portion of the exam is performed with the patient in reverse Trendelenburg.  +---------+---------------+---------+-----------+----------+-------------------+ RIGHT    CompressibilityPhasicitySpontaneityPropertiesThrombus Aging      +---------+---------------+---------+-----------+----------+-------------------+ CFV      Full           Yes      Yes                                      +---------+---------------+---------+-----------+----------+-------------------+ SFJ      Full                                                             +---------+---------------+---------+-----------+----------+-------------------+ FV Prox  Full           Yes      Yes                                      +---------+---------------+---------+-----------+----------+-------------------+ FV Mid   Full           Yes      Yes                                      +---------+---------------+---------+-----------+----------+-------------------+ FV DistalFull           Yes      Yes                                      +---------+---------------+---------+-----------+----------+-------------------+ PFV      Full                                                             +---------+---------------+---------+-----------+----------+-------------------+ POP      Full           Yes      Yes                                      +---------+---------------+---------+-----------+----------+-------------------+ PTV      Full  Not well visualized +---------+---------------+---------+-----------+----------+-------------------+ PERO     Full                                          Not well visualized +---------+---------------+---------+-----------+----------+-------------------+   +---------+---------------+---------+-----------+----------+--------------+ LEFT     CompressibilityPhasicitySpontaneityPropertiesThrombus Aging +---------+---------------+---------+-----------+----------+--------------+ CFV      Full           Yes      Yes                                 +---------+---------------+---------+-----------+----------+--------------+ SFJ      Full                                                        +---------+---------------+---------+-----------+----------+--------------+ FV Prox  Full           Yes      Yes                                 +---------+---------------+---------+-----------+----------+--------------+ FV Mid   Full           Yes      Yes                                 +---------+---------------+---------+-----------+----------+--------------+ FV DistalFull           Yes      Yes                                 +---------+---------------+---------+-----------+----------+--------------+ PFV      Full                                                        +---------+---------------+---------+-----------+----------+--------------+ POP      Full           Yes      Yes                                 +---------+---------------+---------+-----------+----------+--------------+ PTV      Full                                                        +---------+---------------+---------+-----------+----------+--------------+ PERO     Full                                                        +---------+---------------+---------+-----------+----------+--------------+     Summary: BILATERAL: - No  evidence of deep vein thrombosis seen in the lower extremities, bilaterally. - No evidence of superficial venous thrombosis in the lower extremities, bilaterally. -No evidence of popliteal  cyst, bilaterally.   *See table(s) above for measurements and observations. Electronically signed by Servando Snare MD on 06/17/2020 at 2:57:05 PM.    Final     Procedures .Critical Care Performed by: Daleen Bo, MD Authorized by: Daleen Bo, MD   Critical care provider statement:    Critical care time (minutes):  35   Critical care start time:  06/17/2020 11:40 AM   Critical care end time:  06/17/2020 3:30 PM   Critical care time was exclusive of:  Separately billable procedures and treating other patients   Critical care was time spent personally by me on the following activities:  Blood draw for specimens, development of treatment plan with patient or surrogate, discussions with consultants, evaluation of patient's response to treatment, examination of patient, obtaining history from patient or surrogate, ordering and performing treatments and interventions, ordering and review of laboratory studies, pulse oximetry, re-evaluation of patient's condition, review of old charts and ordering and review of radiographic studies     Medications Ordered in ED Medications - No data to display  ED Course  I have reviewed the triage vital signs and the nursing notes.  Pertinent labs & imaging results that were available during my care of the patient were reviewed by me and considered in my medical decision making (see chart for details).    MDM Rules/Calculators/A&P                           Patient Vitals for the past 24 hrs:  BP Temp Temp src Pulse Resp SpO2 Height Weight  06/17/20 1500 (!) 144/85 -- -- 61 20 98 % -- --  06/17/20 1430 139/84 -- -- (!) 57 (!) 21 100 % -- --  06/17/20 1330 138/83 -- -- (!) 58 18 98 % -- --  06/17/20 1230 132/79 -- -- 60 18 100 % -- --  06/17/20 1055 -- -- -- -- -- -- 5\' 8"  (1.727 m) 79.4 kg  06/17/20 1035 121/87 98 F (36.7 C) Oral 76 16 100 % -- --    3:30 PM Reevaluation with update and discussion. After initial assessment and treatment, an updated  evaluation reveals at this time he is calm and comfortable and expresses desire to go home.  Brother is with him and will assist the patient.  Findings discussed and questions answered. Daleen Bo   Medical Decision Making:  This patient is presenting for evaluation of weakness and leg swelling, which does require a range of treatment options, and is a complaint that involves a high risk of morbidity and mortality. The differential diagnoses include acute infection, metabolic disorder, DVT, acute intra-abdominal process, progressive spine disorder. I decided to review old records, and in summary elderly male, debilitated from chronic back pain, recently nursing home but at home currently.  He has chronic pain, major depression, recurrent UTIs and bladder spasm, is typically able to urinate on his own, and has a history of constipation.  He is presenting for aggressive symptoms.  I obtained additional historical information from his brother at the bedside.  Clinical Laboratory Tests Ordered, included CBC, Metabolic panel, Urinalysis and BNP. Review indicates essentially normal findings. Radiologic Tests Ordered, included lower extremity venous ultrasound, chest x-ray.  I independently Visualized: Radiographic images, which show no acute abnormalities   I ordered  and Reviewed the urinary bladder scan medicine Test.  Greater than 400 cc urine in the urinary bladder, consistent with urinary retention  Critical Interventions-clinical evaluation, laboratory testing, radiography, bladder scan, Foley catheterization, observation, reassessment  After These Interventions, the Patient was reevaluated and was found with urinary retention contributing to discomfort and likely peripheral edema.  No acute renal failure.  Patient is debilitated, due to chronic back and leg pain.  Doubt new spinal myelopathy.  Patient is currently being treated for UTI and followed closely by urology.  Will discharge with Foley  catheter, with instructions to follow-up with urology in 1 week for possible catheter removal and voiding trial.  Doubt sepsis.  Stable for patient management.  Will advance in-home services with PT assessment treatment for peripheral edema to encourage body positioning and possibly secondary treatment methods for peripheral edema.  Consult social work for assistance with initiation of home health orders which have been entered.  CRITICAL CARE-yes Performed by: Daleen Bo  Nursing Notes Reviewed/ Care Coordinated Applicable Imaging Reviewed Interpretation of Laboratory Data incorporated into ED treatment  The patient appears reasonably screened and/or stabilized for discharge and I doubt any other medical condition or other [facility] requiring further screening, evaluation, or treatment in the ED at this time prior to discharge.  Plan: Home Medications-continue usual; Home Treatments-elevate legs to encourage venous return; return here if the recommended treatment, does not improve the symptoms; Recommended follow up-urology 1 week, PCP, as needed     Final Clinical Impression(s) / ED Diagnoses Final diagnoses:  Urinary retention  Leg swelling  Weakness    Rx / DC Orders ED Discharge Orders    None       Daleen Bo, MD 06/18/20 1242

## 2020-06-17 NOTE — ED Triage Notes (Signed)
Patient c/o bilateral leg swelling x 3 days. Patient's brother reports that the patient has had frequent falls this weekend.  Patient's brother reports that the patient ws diagnosed with a UTI 5 days and has been on antibiotics for the same.  Patient has weakness of the right leg. Patient went to rehab and strength improved.  the brother states in  1 1/2 weeks the same weakness that he had prior to rehab had returned.

## 2020-06-17 NOTE — Discharge Instructions (Signed)
Hopefully the Foley catheter will improve the swelling you are having in your legs.  Also make sure you are elevating your legs above your heart with your body is straight as possible to improve venous return to your heart.  We have asked physical therapy to help assess and treat your leg swelling and weakness.  It would probably be helpful to see Dr. Riley Kill as well.  Continue taking your usual medications.

## 2020-06-17 NOTE — Progress Notes (Signed)
CSW spoke with [place] at [facility] who confirmed patient was discharged with home health with [facility]. CSW also left a message with patients brother to notify him. Patient still active with [facility].

## 2020-06-17 NOTE — ED Provider Notes (Signed)
Emergency Medicine Provider Triage Evaluation Note  Luke Foster , a 70 y.o. male  was evaluated in triage.  Pt complains of bilateral lower extremity swelling and weakness in his legs.  Symptoms worse on the right than left.  Has been in rehab previously for lower extremity weakness.  Was diagnosed with a UTI 5 days ago and started on antibiotics.  Review of Systems  Positive: Leg swelling, weakness Negative: Chest pain, shortness of breath, fever  Physical Exam  BP 121/87 (BP Location: Right Arm)   Pulse 76   Temp 98 F (36.7 C) (Oral)   Resp 16   Ht 5\' 8"  (1.727 m)   Wt 79.4 kg   SpO2 100%   BMI 26.61 kg/m  Gen:   Awake, no distress   Resp:  Normal effort  MSK:   Moves extremities without difficulty  Other:  Swelling of bilateral lower extremities, worse on right than left  Medical Decision Making  Medically screening exam initiated at 11:32 AM.  Appropriate orders placed.  Luke Foster was informed that the remainder of the evaluation will be completed by another provider, this initial triage assessment does not replace that evaluation, and the importance of remaining in the ED until their evaluation is complete.     Jacqlyn Larsen, PA-C 06/17/20 1136    Valarie Merino, MD 06/19/20 [phone]

## 2020-06-17 NOTE — ED Notes (Signed)
Pt placed on purewick 

## 2020-06-18 LAB — URINE CULTURE: Culture: NO GROWTH

## 2020-06-19 DIAGNOSIS — R600 Localized edema: Secondary | ICD-10-CM | POA: Diagnosis not present

## 2020-06-19 DIAGNOSIS — R202 Paresthesia of skin: Secondary | ICD-10-CM | POA: Diagnosis not present

## 2020-06-19 DIAGNOSIS — Z79891 Long term (current) use of opiate analgesic: Secondary | ICD-10-CM | POA: Diagnosis not present

## 2020-06-19 DIAGNOSIS — M21371 Foot drop, right foot: Secondary | ICD-10-CM | POA: Diagnosis not present

## 2020-06-19 DIAGNOSIS — R338 Other retention of urine: Secondary | ICD-10-CM | POA: Diagnosis not present

## 2020-06-19 DIAGNOSIS — G629 Polyneuropathy, unspecified: Secondary | ICD-10-CM | POA: Diagnosis not present

## 2020-06-19 DIAGNOSIS — M1991 Primary osteoarthritis, unspecified site: Secondary | ICD-10-CM | POA: Diagnosis not present

## 2020-06-19 DIAGNOSIS — E785 Hyperlipidemia, unspecified: Secondary | ICD-10-CM | POA: Diagnosis not present

## 2020-06-19 DIAGNOSIS — F32A Depression, unspecified: Secondary | ICD-10-CM | POA: Diagnosis not present

## 2020-06-19 DIAGNOSIS — Z9181 History of falling: Secondary | ICD-10-CM | POA: Diagnosis not present

## 2020-06-19 DIAGNOSIS — G2581 Restless legs syndrome: Secondary | ICD-10-CM | POA: Diagnosis not present

## 2020-06-19 DIAGNOSIS — G894 Chronic pain syndrome: Secondary | ICD-10-CM | POA: Diagnosis not present

## 2020-06-19 DIAGNOSIS — I10 Essential (primary) hypertension: Secondary | ICD-10-CM | POA: Diagnosis not present

## 2020-06-19 DIAGNOSIS — G4730 Sleep apnea, unspecified: Secondary | ICD-10-CM | POA: Diagnosis not present

## 2020-06-20 DIAGNOSIS — R600 Localized edema: Secondary | ICD-10-CM | POA: Diagnosis not present

## 2020-06-20 DIAGNOSIS — M6281 Muscle weakness (generalized): Secondary | ICD-10-CM | POA: Diagnosis not present

## 2020-06-21 DIAGNOSIS — G2581 Restless legs syndrome: Secondary | ICD-10-CM | POA: Diagnosis not present

## 2020-06-21 DIAGNOSIS — Z9181 History of falling: Secondary | ICD-10-CM | POA: Diagnosis not present

## 2020-06-21 DIAGNOSIS — G4730 Sleep apnea, unspecified: Secondary | ICD-10-CM | POA: Diagnosis not present

## 2020-06-21 DIAGNOSIS — M21371 Foot drop, right foot: Secondary | ICD-10-CM | POA: Diagnosis not present

## 2020-06-21 DIAGNOSIS — R338 Other retention of urine: Secondary | ICD-10-CM | POA: Diagnosis not present

## 2020-06-21 DIAGNOSIS — Z79891 Long term (current) use of opiate analgesic: Secondary | ICD-10-CM | POA: Diagnosis not present

## 2020-06-21 DIAGNOSIS — I10 Essential (primary) hypertension: Secondary | ICD-10-CM | POA: Diagnosis not present

## 2020-06-21 DIAGNOSIS — F32A Depression, unspecified: Secondary | ICD-10-CM | POA: Diagnosis not present

## 2020-06-21 DIAGNOSIS — M1991 Primary osteoarthritis, unspecified site: Secondary | ICD-10-CM | POA: Diagnosis not present

## 2020-06-21 DIAGNOSIS — E785 Hyperlipidemia, unspecified: Secondary | ICD-10-CM | POA: Diagnosis not present

## 2020-06-21 DIAGNOSIS — G629 Polyneuropathy, unspecified: Secondary | ICD-10-CM | POA: Diagnosis not present

## 2020-06-21 DIAGNOSIS — R600 Localized edema: Secondary | ICD-10-CM | POA: Diagnosis not present

## 2020-06-21 DIAGNOSIS — G894 Chronic pain syndrome: Secondary | ICD-10-CM | POA: Diagnosis not present

## 2020-06-21 DIAGNOSIS — R202 Paresthesia of skin: Secondary | ICD-10-CM | POA: Diagnosis not present

## 2020-06-24 DIAGNOSIS — R338 Other retention of urine: Secondary | ICD-10-CM | POA: Diagnosis not present

## 2020-06-24 DIAGNOSIS — E785 Hyperlipidemia, unspecified: Secondary | ICD-10-CM | POA: Diagnosis not present

## 2020-06-24 DIAGNOSIS — R202 Paresthesia of skin: Secondary | ICD-10-CM | POA: Diagnosis not present

## 2020-06-24 DIAGNOSIS — G2581 Restless legs syndrome: Secondary | ICD-10-CM | POA: Diagnosis not present

## 2020-06-24 DIAGNOSIS — G629 Polyneuropathy, unspecified: Secondary | ICD-10-CM | POA: Diagnosis not present

## 2020-06-24 DIAGNOSIS — M1991 Primary osteoarthritis, unspecified site: Secondary | ICD-10-CM | POA: Diagnosis not present

## 2020-06-24 DIAGNOSIS — Z9181 History of falling: Secondary | ICD-10-CM | POA: Diagnosis not present

## 2020-06-24 DIAGNOSIS — G894 Chronic pain syndrome: Secondary | ICD-10-CM | POA: Diagnosis not present

## 2020-06-24 DIAGNOSIS — G4730 Sleep apnea, unspecified: Secondary | ICD-10-CM | POA: Diagnosis not present

## 2020-06-24 DIAGNOSIS — M21371 Foot drop, right foot: Secondary | ICD-10-CM | POA: Diagnosis not present

## 2020-06-24 DIAGNOSIS — I10 Essential (primary) hypertension: Secondary | ICD-10-CM | POA: Diagnosis not present

## 2020-06-24 DIAGNOSIS — F32A Depression, unspecified: Secondary | ICD-10-CM | POA: Diagnosis not present

## 2020-06-24 DIAGNOSIS — R600 Localized edema: Secondary | ICD-10-CM | POA: Diagnosis not present

## 2020-06-24 DIAGNOSIS — Z79891 Long term (current) use of opiate analgesic: Secondary | ICD-10-CM | POA: Diagnosis not present

## 2020-06-25 DIAGNOSIS — Z681 Body mass index (BMI) 19 or less, adult: Secondary | ICD-10-CM | POA: Diagnosis not present

## 2020-06-25 DIAGNOSIS — M1991 Primary osteoarthritis, unspecified site: Secondary | ICD-10-CM | POA: Diagnosis not present

## 2020-06-25 DIAGNOSIS — G2581 Restless legs syndrome: Secondary | ICD-10-CM | POA: Diagnosis not present

## 2020-06-25 DIAGNOSIS — R600 Localized edema: Secondary | ICD-10-CM | POA: Diagnosis not present

## 2020-06-25 DIAGNOSIS — E538 Deficiency of other specified B group vitamins: Secondary | ICD-10-CM | POA: Diagnosis not present

## 2020-06-25 DIAGNOSIS — G4730 Sleep apnea, unspecified: Secondary | ICD-10-CM | POA: Diagnosis not present

## 2020-06-25 DIAGNOSIS — R338 Other retention of urine: Secondary | ICD-10-CM | POA: Diagnosis not present

## 2020-06-25 DIAGNOSIS — E559 Vitamin D deficiency, unspecified: Secondary | ICD-10-CM | POA: Diagnosis not present

## 2020-06-25 DIAGNOSIS — Z79891 Long term (current) use of opiate analgesic: Secondary | ICD-10-CM | POA: Diagnosis not present

## 2020-06-25 DIAGNOSIS — E785 Hyperlipidemia, unspecified: Secondary | ICD-10-CM | POA: Diagnosis not present

## 2020-06-25 DIAGNOSIS — M21371 Foot drop, right foot: Secondary | ICD-10-CM | POA: Diagnosis not present

## 2020-06-25 DIAGNOSIS — F32A Depression, unspecified: Secondary | ICD-10-CM | POA: Diagnosis not present

## 2020-06-25 DIAGNOSIS — G894 Chronic pain syndrome: Secondary | ICD-10-CM | POA: Diagnosis not present

## 2020-06-25 DIAGNOSIS — R202 Paresthesia of skin: Secondary | ICD-10-CM | POA: Diagnosis not present

## 2020-06-25 DIAGNOSIS — G629 Polyneuropathy, unspecified: Secondary | ICD-10-CM | POA: Diagnosis not present

## 2020-06-25 DIAGNOSIS — I10 Essential (primary) hypertension: Secondary | ICD-10-CM | POA: Diagnosis not present

## 2020-06-25 DIAGNOSIS — Z9181 History of falling: Secondary | ICD-10-CM | POA: Diagnosis not present

## 2020-06-25 DIAGNOSIS — R531 Weakness: Secondary | ICD-10-CM | POA: Diagnosis not present

## 2020-06-25 DIAGNOSIS — Z79899 Other long term (current) drug therapy: Secondary | ICD-10-CM | POA: Diagnosis not present

## 2020-06-27 DIAGNOSIS — K5900 Constipation, unspecified: Secondary | ICD-10-CM | POA: Diagnosis not present

## 2020-06-27 DIAGNOSIS — Z79891 Long term (current) use of opiate analgesic: Secondary | ICD-10-CM | POA: Diagnosis not present

## 2020-06-27 DIAGNOSIS — M21371 Foot drop, right foot: Secondary | ICD-10-CM | POA: Diagnosis not present

## 2020-06-27 DIAGNOSIS — I10 Essential (primary) hypertension: Secondary | ICD-10-CM | POA: Diagnosis not present

## 2020-06-27 DIAGNOSIS — L89322 Pressure ulcer of left buttock, stage 2: Secondary | ICD-10-CM | POA: Diagnosis not present

## 2020-06-27 DIAGNOSIS — G629 Polyneuropathy, unspecified: Secondary | ICD-10-CM | POA: Diagnosis not present

## 2020-06-27 DIAGNOSIS — G4730 Sleep apnea, unspecified: Secondary | ICD-10-CM | POA: Diagnosis not present

## 2020-06-27 DIAGNOSIS — Z87442 Personal history of urinary calculi: Secondary | ICD-10-CM | POA: Diagnosis not present

## 2020-06-27 DIAGNOSIS — R338 Other retention of urine: Secondary | ICD-10-CM | POA: Diagnosis not present

## 2020-06-27 DIAGNOSIS — I700 Atherosclerosis of aorta: Secondary | ICD-10-CM | POA: Diagnosis not present

## 2020-06-27 DIAGNOSIS — Z466 Encounter for fitting and adjustment of urinary device: Secondary | ICD-10-CM | POA: Diagnosis not present

## 2020-06-27 DIAGNOSIS — G2581 Restless legs syndrome: Secondary | ICD-10-CM | POA: Diagnosis not present

## 2020-06-27 DIAGNOSIS — G894 Chronic pain syndrome: Secondary | ICD-10-CM | POA: Diagnosis not present

## 2020-06-27 DIAGNOSIS — R202 Paresthesia of skin: Secondary | ICD-10-CM | POA: Diagnosis not present

## 2020-06-27 DIAGNOSIS — Z9181 History of falling: Secondary | ICD-10-CM | POA: Diagnosis not present

## 2020-06-27 DIAGNOSIS — F32A Depression, unspecified: Secondary | ICD-10-CM | POA: Diagnosis not present

## 2020-06-27 DIAGNOSIS — M1991 Primary osteoarthritis, unspecified site: Secondary | ICD-10-CM | POA: Diagnosis not present

## 2020-06-27 DIAGNOSIS — E782 Mixed hyperlipidemia: Secondary | ICD-10-CM | POA: Diagnosis not present

## 2020-06-28 DIAGNOSIS — R2689 Other abnormalities of gait and mobility: Secondary | ICD-10-CM | POA: Diagnosis not present

## 2020-06-28 DIAGNOSIS — M6281 Muscle weakness (generalized): Secondary | ICD-10-CM | POA: Diagnosis not present

## 2020-06-28 DIAGNOSIS — G9341 Metabolic encephalopathy: Secondary | ICD-10-CM | POA: Diagnosis not present

## 2020-06-28 DIAGNOSIS — R400 Somnolence: Secondary | ICD-10-CM | POA: Diagnosis not present

## 2020-06-28 DIAGNOSIS — I10 Essential (primary) hypertension: Secondary | ICD-10-CM | POA: Diagnosis not present

## 2020-06-28 DIAGNOSIS — G4730 Sleep apnea, unspecified: Secondary | ICD-10-CM | POA: Diagnosis not present

## 2020-06-28 DIAGNOSIS — M21371 Foot drop, right foot: Secondary | ICD-10-CM | POA: Diagnosis not present

## 2020-06-28 DIAGNOSIS — R339 Retention of urine, unspecified: Secondary | ICD-10-CM | POA: Diagnosis not present

## 2020-06-28 DIAGNOSIS — R279 Unspecified lack of coordination: Secondary | ICD-10-CM | POA: Diagnosis not present

## 2020-06-28 DIAGNOSIS — G8929 Other chronic pain: Secondary | ICD-10-CM | POA: Diagnosis not present

## 2020-06-28 DIAGNOSIS — R2681 Unsteadiness on feet: Secondary | ICD-10-CM | POA: Diagnosis not present

## 2020-06-28 DIAGNOSIS — T85112D Breakdown (mechanical) of implanted electronic neurostimulator (electrode) of spinal cord, subsequent encounter: Secondary | ICD-10-CM | POA: Diagnosis not present

## 2020-06-30 DIAGNOSIS — R279 Unspecified lack of coordination: Secondary | ICD-10-CM | POA: Diagnosis not present

## 2020-06-30 DIAGNOSIS — G8929 Other chronic pain: Secondary | ICD-10-CM | POA: Diagnosis not present

## 2020-06-30 DIAGNOSIS — M6281 Muscle weakness (generalized): Secondary | ICD-10-CM | POA: Diagnosis not present

## 2020-06-30 DIAGNOSIS — T85112D Breakdown (mechanical) of implanted electronic neurostimulator (electrode) of spinal cord, subsequent encounter: Secondary | ICD-10-CM | POA: Diagnosis not present

## 2020-06-30 DIAGNOSIS — R339 Retention of urine, unspecified: Secondary | ICD-10-CM | POA: Diagnosis not present

## 2020-06-30 DIAGNOSIS — R2689 Other abnormalities of gait and mobility: Secondary | ICD-10-CM | POA: Diagnosis not present

## 2020-06-30 DIAGNOSIS — M21371 Foot drop, right foot: Secondary | ICD-10-CM | POA: Diagnosis not present

## 2020-06-30 DIAGNOSIS — G9341 Metabolic encephalopathy: Secondary | ICD-10-CM | POA: Diagnosis not present

## 2020-06-30 DIAGNOSIS — R400 Somnolence: Secondary | ICD-10-CM | POA: Diagnosis not present

## 2020-06-30 DIAGNOSIS — I10 Essential (primary) hypertension: Secondary | ICD-10-CM | POA: Diagnosis not present

## 2020-06-30 DIAGNOSIS — R2681 Unsteadiness on feet: Secondary | ICD-10-CM | POA: Diagnosis not present

## 2020-06-30 DIAGNOSIS — G4730 Sleep apnea, unspecified: Secondary | ICD-10-CM | POA: Diagnosis not present

## 2020-07-01 DIAGNOSIS — R339 Retention of urine, unspecified: Secondary | ICD-10-CM | POA: Diagnosis not present

## 2020-07-01 DIAGNOSIS — R2681 Unsteadiness on feet: Secondary | ICD-10-CM | POA: Diagnosis not present

## 2020-07-01 DIAGNOSIS — I10 Essential (primary) hypertension: Secondary | ICD-10-CM | POA: Diagnosis not present

## 2020-07-01 DIAGNOSIS — G8929 Other chronic pain: Secondary | ICD-10-CM | POA: Diagnosis not present

## 2020-07-01 DIAGNOSIS — G9341 Metabolic encephalopathy: Secondary | ICD-10-CM | POA: Diagnosis not present

## 2020-07-01 DIAGNOSIS — M6281 Muscle weakness (generalized): Secondary | ICD-10-CM | POA: Diagnosis not present

## 2020-07-01 DIAGNOSIS — R279 Unspecified lack of coordination: Secondary | ICD-10-CM | POA: Diagnosis not present

## 2020-07-01 DIAGNOSIS — G4730 Sleep apnea, unspecified: Secondary | ICD-10-CM | POA: Diagnosis not present

## 2020-07-01 DIAGNOSIS — N390 Urinary tract infection, site not specified: Secondary | ICD-10-CM | POA: Diagnosis not present

## 2020-07-01 DIAGNOSIS — M21371 Foot drop, right foot: Secondary | ICD-10-CM | POA: Diagnosis not present

## 2020-07-01 DIAGNOSIS — R400 Somnolence: Secondary | ICD-10-CM | POA: Diagnosis not present

## 2020-07-01 DIAGNOSIS — T85112D Breakdown (mechanical) of implanted electronic neurostimulator (electrode) of spinal cord, subsequent encounter: Secondary | ICD-10-CM | POA: Diagnosis not present

## 2020-07-01 DIAGNOSIS — R2689 Other abnormalities of gait and mobility: Secondary | ICD-10-CM | POA: Diagnosis not present

## 2020-07-01 DIAGNOSIS — R5381 Other malaise: Secondary | ICD-10-CM | POA: Diagnosis not present

## 2020-07-02 DIAGNOSIS — R339 Retention of urine, unspecified: Secondary | ICD-10-CM | POA: Diagnosis not present

## 2020-07-02 DIAGNOSIS — N390 Urinary tract infection, site not specified: Secondary | ICD-10-CM | POA: Diagnosis not present

## 2020-07-02 DIAGNOSIS — M5416 Radiculopathy, lumbar region: Secondary | ICD-10-CM | POA: Diagnosis not present

## 2020-07-02 DIAGNOSIS — I700 Atherosclerosis of aorta: Secondary | ICD-10-CM | POA: Diagnosis not present

## 2020-07-02 DIAGNOSIS — G8929 Other chronic pain: Secondary | ICD-10-CM | POA: Diagnosis not present

## 2020-07-02 DIAGNOSIS — R5381 Other malaise: Secondary | ICD-10-CM | POA: Diagnosis not present

## 2020-07-02 DIAGNOSIS — N319 Neuromuscular dysfunction of bladder, unspecified: Secondary | ICD-10-CM | POA: Diagnosis not present

## 2020-07-02 DIAGNOSIS — I10 Essential (primary) hypertension: Secondary | ICD-10-CM | POA: Diagnosis not present

## 2020-07-02 DIAGNOSIS — M1990 Unspecified osteoarthritis, unspecified site: Secondary | ICD-10-CM | POA: Diagnosis not present

## 2020-07-02 DIAGNOSIS — R2681 Unsteadiness on feet: Secondary | ICD-10-CM | POA: Diagnosis not present

## 2020-07-02 DIAGNOSIS — M6281 Muscle weakness (generalized): Secondary | ICD-10-CM | POA: Diagnosis not present

## 2020-07-02 DIAGNOSIS — E782 Mixed hyperlipidemia: Secondary | ICD-10-CM | POA: Diagnosis not present

## 2020-07-03 DIAGNOSIS — R339 Retention of urine, unspecified: Secondary | ICD-10-CM | POA: Diagnosis not present

## 2020-07-03 DIAGNOSIS — M6281 Muscle weakness (generalized): Secondary | ICD-10-CM | POA: Diagnosis not present

## 2020-07-03 DIAGNOSIS — M21371 Foot drop, right foot: Secondary | ICD-10-CM | POA: Diagnosis not present

## 2020-07-03 DIAGNOSIS — R2689 Other abnormalities of gait and mobility: Secondary | ICD-10-CM | POA: Diagnosis not present

## 2020-07-03 DIAGNOSIS — R2681 Unsteadiness on feet: Secondary | ICD-10-CM | POA: Diagnosis not present

## 2020-07-03 DIAGNOSIS — G9341 Metabolic encephalopathy: Secondary | ICD-10-CM | POA: Diagnosis not present

## 2020-07-03 DIAGNOSIS — R279 Unspecified lack of coordination: Secondary | ICD-10-CM | POA: Diagnosis not present

## 2020-07-03 DIAGNOSIS — G4730 Sleep apnea, unspecified: Secondary | ICD-10-CM | POA: Diagnosis not present

## 2020-07-03 DIAGNOSIS — R400 Somnolence: Secondary | ICD-10-CM | POA: Diagnosis not present

## 2020-07-03 DIAGNOSIS — T85112D Breakdown (mechanical) of implanted electronic neurostimulator (electrode) of spinal cord, subsequent encounter: Secondary | ICD-10-CM | POA: Diagnosis not present

## 2020-07-03 DIAGNOSIS — I10 Essential (primary) hypertension: Secondary | ICD-10-CM | POA: Diagnosis not present

## 2020-07-03 DIAGNOSIS — G8929 Other chronic pain: Secondary | ICD-10-CM | POA: Diagnosis not present

## 2020-07-04 DIAGNOSIS — T85112D Breakdown (mechanical) of implanted electronic neurostimulator (electrode) of spinal cord, subsequent encounter: Secondary | ICD-10-CM | POA: Diagnosis not present

## 2020-07-04 DIAGNOSIS — I10 Essential (primary) hypertension: Secondary | ICD-10-CM | POA: Diagnosis not present

## 2020-07-04 DIAGNOSIS — G8929 Other chronic pain: Secondary | ICD-10-CM | POA: Diagnosis not present

## 2020-07-04 DIAGNOSIS — R400 Somnolence: Secondary | ICD-10-CM | POA: Diagnosis not present

## 2020-07-04 DIAGNOSIS — M21371 Foot drop, right foot: Secondary | ICD-10-CM | POA: Diagnosis not present

## 2020-07-04 DIAGNOSIS — R279 Unspecified lack of coordination: Secondary | ICD-10-CM | POA: Diagnosis not present

## 2020-07-04 DIAGNOSIS — R339 Retention of urine, unspecified: Secondary | ICD-10-CM | POA: Diagnosis not present

## 2020-07-04 DIAGNOSIS — G4730 Sleep apnea, unspecified: Secondary | ICD-10-CM | POA: Diagnosis not present

## 2020-07-04 DIAGNOSIS — M6281 Muscle weakness (generalized): Secondary | ICD-10-CM | POA: Diagnosis not present

## 2020-07-04 DIAGNOSIS — G9341 Metabolic encephalopathy: Secondary | ICD-10-CM | POA: Diagnosis not present

## 2020-07-04 DIAGNOSIS — R2681 Unsteadiness on feet: Secondary | ICD-10-CM | POA: Diagnosis not present

## 2020-07-04 DIAGNOSIS — R2689 Other abnormalities of gait and mobility: Secondary | ICD-10-CM | POA: Diagnosis not present

## 2020-07-05 DIAGNOSIS — M6281 Muscle weakness (generalized): Secondary | ICD-10-CM | POA: Diagnosis not present

## 2020-07-05 DIAGNOSIS — R2681 Unsteadiness on feet: Secondary | ICD-10-CM | POA: Diagnosis not present

## 2020-07-05 DIAGNOSIS — R339 Retention of urine, unspecified: Secondary | ICD-10-CM | POA: Diagnosis not present

## 2020-07-05 DIAGNOSIS — G8929 Other chronic pain: Secondary | ICD-10-CM | POA: Diagnosis not present

## 2020-07-05 DIAGNOSIS — I10 Essential (primary) hypertension: Secondary | ICD-10-CM | POA: Diagnosis not present

## 2020-07-05 DIAGNOSIS — R279 Unspecified lack of coordination: Secondary | ICD-10-CM | POA: Diagnosis not present

## 2020-07-05 DIAGNOSIS — G4730 Sleep apnea, unspecified: Secondary | ICD-10-CM | POA: Diagnosis not present

## 2020-07-05 DIAGNOSIS — M21371 Foot drop, right foot: Secondary | ICD-10-CM | POA: Diagnosis not present

## 2020-07-05 DIAGNOSIS — R2689 Other abnormalities of gait and mobility: Secondary | ICD-10-CM | POA: Diagnosis not present

## 2020-07-05 DIAGNOSIS — G9341 Metabolic encephalopathy: Secondary | ICD-10-CM | POA: Diagnosis not present

## 2020-07-05 DIAGNOSIS — T85112D Breakdown (mechanical) of implanted electronic neurostimulator (electrode) of spinal cord, subsequent encounter: Secondary | ICD-10-CM | POA: Diagnosis not present

## 2020-07-05 DIAGNOSIS — R400 Somnolence: Secondary | ICD-10-CM | POA: Diagnosis not present

## 2020-07-06 DIAGNOSIS — R339 Retention of urine, unspecified: Secondary | ICD-10-CM | POA: Diagnosis not present

## 2020-07-06 DIAGNOSIS — G8929 Other chronic pain: Secondary | ICD-10-CM | POA: Diagnosis not present

## 2020-07-06 DIAGNOSIS — I10 Essential (primary) hypertension: Secondary | ICD-10-CM | POA: Diagnosis not present

## 2020-07-06 DIAGNOSIS — T85112D Breakdown (mechanical) of implanted electronic neurostimulator (electrode) of spinal cord, subsequent encounter: Secondary | ICD-10-CM | POA: Diagnosis not present

## 2020-07-06 DIAGNOSIS — R279 Unspecified lack of coordination: Secondary | ICD-10-CM | POA: Diagnosis not present

## 2020-07-06 DIAGNOSIS — M21371 Foot drop, right foot: Secondary | ICD-10-CM | POA: Diagnosis not present

## 2020-07-06 DIAGNOSIS — R400 Somnolence: Secondary | ICD-10-CM | POA: Diagnosis not present

## 2020-07-06 DIAGNOSIS — R2681 Unsteadiness on feet: Secondary | ICD-10-CM | POA: Diagnosis not present

## 2020-07-06 DIAGNOSIS — R2689 Other abnormalities of gait and mobility: Secondary | ICD-10-CM | POA: Diagnosis not present

## 2020-07-06 DIAGNOSIS — G4730 Sleep apnea, unspecified: Secondary | ICD-10-CM | POA: Diagnosis not present

## 2020-07-06 DIAGNOSIS — M6281 Muscle weakness (generalized): Secondary | ICD-10-CM | POA: Diagnosis not present

## 2020-07-06 DIAGNOSIS — G9341 Metabolic encephalopathy: Secondary | ICD-10-CM | POA: Diagnosis not present

## 2020-07-09 DIAGNOSIS — R2681 Unsteadiness on feet: Secondary | ICD-10-CM | POA: Diagnosis not present

## 2020-07-09 DIAGNOSIS — M6281 Muscle weakness (generalized): Secondary | ICD-10-CM | POA: Diagnosis not present

## 2020-07-09 DIAGNOSIS — M21371 Foot drop, right foot: Secondary | ICD-10-CM | POA: Diagnosis not present

## 2020-07-09 DIAGNOSIS — R279 Unspecified lack of coordination: Secondary | ICD-10-CM | POA: Diagnosis not present

## 2020-07-09 DIAGNOSIS — R2689 Other abnormalities of gait and mobility: Secondary | ICD-10-CM | POA: Diagnosis not present

## 2020-07-09 DIAGNOSIS — G8929 Other chronic pain: Secondary | ICD-10-CM | POA: Diagnosis not present

## 2020-07-09 DIAGNOSIS — T85112D Breakdown (mechanical) of implanted electronic neurostimulator (electrode) of spinal cord, subsequent encounter: Secondary | ICD-10-CM | POA: Diagnosis not present

## 2020-07-09 DIAGNOSIS — R400 Somnolence: Secondary | ICD-10-CM | POA: Diagnosis not present

## 2020-07-09 DIAGNOSIS — G4730 Sleep apnea, unspecified: Secondary | ICD-10-CM | POA: Diagnosis not present

## 2020-07-09 DIAGNOSIS — I10 Essential (primary) hypertension: Secondary | ICD-10-CM | POA: Diagnosis not present

## 2020-07-09 DIAGNOSIS — G9341 Metabolic encephalopathy: Secondary | ICD-10-CM | POA: Diagnosis not present

## 2020-07-09 DIAGNOSIS — R339 Retention of urine, unspecified: Secondary | ICD-10-CM | POA: Diagnosis not present

## 2020-07-10 DIAGNOSIS — R251 Tremor, unspecified: Secondary | ICD-10-CM | POA: Diagnosis not present

## 2020-07-10 DIAGNOSIS — R627 Adult failure to thrive: Secondary | ICD-10-CM | POA: Diagnosis not present

## 2020-07-10 DIAGNOSIS — Z9189 Other specified personal risk factors, not elsewhere classified: Secondary | ICD-10-CM | POA: Diagnosis not present

## 2020-07-12 DIAGNOSIS — Z79891 Long term (current) use of opiate analgesic: Secondary | ICD-10-CM | POA: Diagnosis not present

## 2020-07-12 DIAGNOSIS — F32A Depression, unspecified: Secondary | ICD-10-CM | POA: Diagnosis not present

## 2020-07-12 DIAGNOSIS — R338 Other retention of urine: Secondary | ICD-10-CM | POA: Diagnosis not present

## 2020-07-12 DIAGNOSIS — Z9181 History of falling: Secondary | ICD-10-CM | POA: Diagnosis not present

## 2020-07-12 DIAGNOSIS — R202 Paresthesia of skin: Secondary | ICD-10-CM | POA: Diagnosis not present

## 2020-07-12 DIAGNOSIS — E782 Mixed hyperlipidemia: Secondary | ICD-10-CM | POA: Diagnosis not present

## 2020-07-12 DIAGNOSIS — G4730 Sleep apnea, unspecified: Secondary | ICD-10-CM | POA: Diagnosis not present

## 2020-07-12 DIAGNOSIS — L89322 Pressure ulcer of left buttock, stage 2: Secondary | ICD-10-CM | POA: Diagnosis not present

## 2020-07-12 DIAGNOSIS — I700 Atherosclerosis of aorta: Secondary | ICD-10-CM | POA: Diagnosis not present

## 2020-07-12 DIAGNOSIS — K5900 Constipation, unspecified: Secondary | ICD-10-CM | POA: Diagnosis not present

## 2020-07-12 DIAGNOSIS — G629 Polyneuropathy, unspecified: Secondary | ICD-10-CM | POA: Diagnosis not present

## 2020-07-12 DIAGNOSIS — G894 Chronic pain syndrome: Secondary | ICD-10-CM | POA: Diagnosis not present

## 2020-07-12 DIAGNOSIS — M1991 Primary osteoarthritis, unspecified site: Secondary | ICD-10-CM | POA: Diagnosis not present

## 2020-07-12 DIAGNOSIS — G2581 Restless legs syndrome: Secondary | ICD-10-CM | POA: Diagnosis not present

## 2020-07-12 DIAGNOSIS — M21371 Foot drop, right foot: Secondary | ICD-10-CM | POA: Diagnosis not present

## 2020-07-12 DIAGNOSIS — Z466 Encounter for fitting and adjustment of urinary device: Secondary | ICD-10-CM | POA: Diagnosis not present

## 2020-07-12 DIAGNOSIS — I10 Essential (primary) hypertension: Secondary | ICD-10-CM | POA: Diagnosis not present

## 2020-07-12 DIAGNOSIS — Z87442 Personal history of urinary calculi: Secondary | ICD-10-CM | POA: Diagnosis not present

## 2020-07-16 DIAGNOSIS — R202 Paresthesia of skin: Secondary | ICD-10-CM | POA: Diagnosis not present

## 2020-07-16 DIAGNOSIS — G2581 Restless legs syndrome: Secondary | ICD-10-CM | POA: Diagnosis not present

## 2020-07-16 DIAGNOSIS — L89322 Pressure ulcer of left buttock, stage 2: Secondary | ICD-10-CM | POA: Diagnosis not present

## 2020-07-16 DIAGNOSIS — R338 Other retention of urine: Secondary | ICD-10-CM | POA: Diagnosis not present

## 2020-07-16 DIAGNOSIS — Z9181 History of falling: Secondary | ICD-10-CM | POA: Diagnosis not present

## 2020-07-16 DIAGNOSIS — G894 Chronic pain syndrome: Secondary | ICD-10-CM | POA: Diagnosis not present

## 2020-07-16 DIAGNOSIS — Z466 Encounter for fitting and adjustment of urinary device: Secondary | ICD-10-CM | POA: Diagnosis not present

## 2020-07-16 DIAGNOSIS — M1991 Primary osteoarthritis, unspecified site: Secondary | ICD-10-CM | POA: Diagnosis not present

## 2020-07-16 DIAGNOSIS — I700 Atherosclerosis of aorta: Secondary | ICD-10-CM | POA: Diagnosis not present

## 2020-07-16 DIAGNOSIS — E782 Mixed hyperlipidemia: Secondary | ICD-10-CM | POA: Diagnosis not present

## 2020-07-16 DIAGNOSIS — M21371 Foot drop, right foot: Secondary | ICD-10-CM | POA: Diagnosis not present

## 2020-07-16 DIAGNOSIS — I10 Essential (primary) hypertension: Secondary | ICD-10-CM | POA: Diagnosis not present

## 2020-07-16 DIAGNOSIS — K5900 Constipation, unspecified: Secondary | ICD-10-CM | POA: Diagnosis not present

## 2020-07-16 DIAGNOSIS — G629 Polyneuropathy, unspecified: Secondary | ICD-10-CM | POA: Diagnosis not present

## 2020-07-16 DIAGNOSIS — F32A Depression, unspecified: Secondary | ICD-10-CM | POA: Diagnosis not present

## 2020-07-16 DIAGNOSIS — Z79891 Long term (current) use of opiate analgesic: Secondary | ICD-10-CM | POA: Diagnosis not present

## 2020-07-16 DIAGNOSIS — Z87442 Personal history of urinary calculi: Secondary | ICD-10-CM | POA: Diagnosis not present

## 2020-07-16 DIAGNOSIS — G4730 Sleep apnea, unspecified: Secondary | ICD-10-CM | POA: Diagnosis not present

## 2020-07-17 DIAGNOSIS — I700 Atherosclerosis of aorta: Secondary | ICD-10-CM | POA: Diagnosis not present

## 2020-07-17 DIAGNOSIS — G2581 Restless legs syndrome: Secondary | ICD-10-CM | POA: Diagnosis not present

## 2020-07-17 DIAGNOSIS — R202 Paresthesia of skin: Secondary | ICD-10-CM | POA: Diagnosis not present

## 2020-07-17 DIAGNOSIS — E782 Mixed hyperlipidemia: Secondary | ICD-10-CM | POA: Diagnosis not present

## 2020-07-17 DIAGNOSIS — Z87442 Personal history of urinary calculi: Secondary | ICD-10-CM | POA: Diagnosis not present

## 2020-07-17 DIAGNOSIS — G629 Polyneuropathy, unspecified: Secondary | ICD-10-CM | POA: Diagnosis not present

## 2020-07-17 DIAGNOSIS — G4730 Sleep apnea, unspecified: Secondary | ICD-10-CM | POA: Diagnosis not present

## 2020-07-17 DIAGNOSIS — I10 Essential (primary) hypertension: Secondary | ICD-10-CM | POA: Diagnosis not present

## 2020-07-17 DIAGNOSIS — L89322 Pressure ulcer of left buttock, stage 2: Secondary | ICD-10-CM | POA: Diagnosis not present

## 2020-07-17 DIAGNOSIS — K5900 Constipation, unspecified: Secondary | ICD-10-CM | POA: Diagnosis not present

## 2020-07-17 DIAGNOSIS — M21371 Foot drop, right foot: Secondary | ICD-10-CM | POA: Diagnosis not present

## 2020-07-17 DIAGNOSIS — Z79891 Long term (current) use of opiate analgesic: Secondary | ICD-10-CM | POA: Diagnosis not present

## 2020-07-17 DIAGNOSIS — R338 Other retention of urine: Secondary | ICD-10-CM | POA: Diagnosis not present

## 2020-07-17 DIAGNOSIS — M1991 Primary osteoarthritis, unspecified site: Secondary | ICD-10-CM | POA: Diagnosis not present

## 2020-07-17 DIAGNOSIS — Z466 Encounter for fitting and adjustment of urinary device: Secondary | ICD-10-CM | POA: Diagnosis not present

## 2020-07-17 DIAGNOSIS — F32A Depression, unspecified: Secondary | ICD-10-CM | POA: Diagnosis not present

## 2020-07-17 DIAGNOSIS — Z9181 History of falling: Secondary | ICD-10-CM | POA: Diagnosis not present

## 2020-07-17 DIAGNOSIS — G894 Chronic pain syndrome: Secondary | ICD-10-CM | POA: Diagnosis not present

## 2020-07-19 DIAGNOSIS — Z466 Encounter for fitting and adjustment of urinary device: Secondary | ICD-10-CM | POA: Diagnosis not present

## 2020-07-19 DIAGNOSIS — R338 Other retention of urine: Secondary | ICD-10-CM | POA: Diagnosis not present

## 2020-07-22 ENCOUNTER — Other Ambulatory Visit: Payer: Self-pay | Admitting: Adult Health

## 2020-07-22 DIAGNOSIS — M21371 Foot drop, right foot: Secondary | ICD-10-CM | POA: Diagnosis not present

## 2020-07-22 DIAGNOSIS — N401 Enlarged prostate with lower urinary tract symptoms: Secondary | ICD-10-CM

## 2020-07-22 DIAGNOSIS — M1991 Primary osteoarthritis, unspecified site: Secondary | ICD-10-CM | POA: Diagnosis not present

## 2020-07-22 DIAGNOSIS — Z9181 History of falling: Secondary | ICD-10-CM | POA: Diagnosis not present

## 2020-07-22 DIAGNOSIS — I10 Essential (primary) hypertension: Secondary | ICD-10-CM | POA: Diagnosis not present

## 2020-07-22 DIAGNOSIS — I700 Atherosclerosis of aorta: Secondary | ICD-10-CM | POA: Diagnosis not present

## 2020-07-22 DIAGNOSIS — G894 Chronic pain syndrome: Secondary | ICD-10-CM | POA: Diagnosis not present

## 2020-07-22 DIAGNOSIS — G4730 Sleep apnea, unspecified: Secondary | ICD-10-CM | POA: Diagnosis not present

## 2020-07-22 DIAGNOSIS — Z87442 Personal history of urinary calculi: Secondary | ICD-10-CM | POA: Diagnosis not present

## 2020-07-22 DIAGNOSIS — K5900 Constipation, unspecified: Secondary | ICD-10-CM | POA: Diagnosis not present

## 2020-07-22 DIAGNOSIS — R202 Paresthesia of skin: Secondary | ICD-10-CM | POA: Diagnosis not present

## 2020-07-22 DIAGNOSIS — F32A Depression, unspecified: Secondary | ICD-10-CM | POA: Diagnosis not present

## 2020-07-22 DIAGNOSIS — Z466 Encounter for fitting and adjustment of urinary device: Secondary | ICD-10-CM | POA: Diagnosis not present

## 2020-07-22 DIAGNOSIS — G2581 Restless legs syndrome: Secondary | ICD-10-CM | POA: Diagnosis not present

## 2020-07-22 DIAGNOSIS — Z79891 Long term (current) use of opiate analgesic: Secondary | ICD-10-CM | POA: Diagnosis not present

## 2020-07-22 DIAGNOSIS — R338 Other retention of urine: Secondary | ICD-10-CM | POA: Diagnosis not present

## 2020-07-22 DIAGNOSIS — L89322 Pressure ulcer of left buttock, stage 2: Secondary | ICD-10-CM | POA: Diagnosis not present

## 2020-07-22 DIAGNOSIS — E782 Mixed hyperlipidemia: Secondary | ICD-10-CM | POA: Diagnosis not present

## 2020-07-22 DIAGNOSIS — G629 Polyneuropathy, unspecified: Secondary | ICD-10-CM | POA: Diagnosis not present

## 2020-07-24 DIAGNOSIS — F32A Depression, unspecified: Secondary | ICD-10-CM | POA: Diagnosis not present

## 2020-07-24 DIAGNOSIS — Z87442 Personal history of urinary calculi: Secondary | ICD-10-CM | POA: Diagnosis not present

## 2020-07-24 DIAGNOSIS — K5900 Constipation, unspecified: Secondary | ICD-10-CM | POA: Diagnosis not present

## 2020-07-24 DIAGNOSIS — G894 Chronic pain syndrome: Secondary | ICD-10-CM | POA: Diagnosis not present

## 2020-07-24 DIAGNOSIS — L89322 Pressure ulcer of left buttock, stage 2: Secondary | ICD-10-CM | POA: Diagnosis not present

## 2020-07-24 DIAGNOSIS — I700 Atherosclerosis of aorta: Secondary | ICD-10-CM | POA: Diagnosis not present

## 2020-07-24 DIAGNOSIS — Z466 Encounter for fitting and adjustment of urinary device: Secondary | ICD-10-CM | POA: Diagnosis not present

## 2020-07-24 DIAGNOSIS — G2581 Restless legs syndrome: Secondary | ICD-10-CM | POA: Diagnosis not present

## 2020-07-24 DIAGNOSIS — M1991 Primary osteoarthritis, unspecified site: Secondary | ICD-10-CM | POA: Diagnosis not present

## 2020-07-24 DIAGNOSIS — R338 Other retention of urine: Secondary | ICD-10-CM | POA: Diagnosis not present

## 2020-07-24 DIAGNOSIS — R202 Paresthesia of skin: Secondary | ICD-10-CM | POA: Diagnosis not present

## 2020-07-24 DIAGNOSIS — Z9181 History of falling: Secondary | ICD-10-CM | POA: Diagnosis not present

## 2020-07-24 DIAGNOSIS — Z79891 Long term (current) use of opiate analgesic: Secondary | ICD-10-CM | POA: Diagnosis not present

## 2020-07-24 DIAGNOSIS — G629 Polyneuropathy, unspecified: Secondary | ICD-10-CM | POA: Diagnosis not present

## 2020-07-24 DIAGNOSIS — M21371 Foot drop, right foot: Secondary | ICD-10-CM | POA: Diagnosis not present

## 2020-07-24 DIAGNOSIS — G4730 Sleep apnea, unspecified: Secondary | ICD-10-CM | POA: Diagnosis not present

## 2020-07-24 DIAGNOSIS — E782 Mixed hyperlipidemia: Secondary | ICD-10-CM | POA: Diagnosis not present

## 2020-07-24 DIAGNOSIS — I10 Essential (primary) hypertension: Secondary | ICD-10-CM | POA: Diagnosis not present

## 2020-07-25 DIAGNOSIS — G894 Chronic pain syndrome: Secondary | ICD-10-CM | POA: Diagnosis not present

## 2020-07-25 DIAGNOSIS — Z87442 Personal history of urinary calculi: Secondary | ICD-10-CM | POA: Diagnosis not present

## 2020-07-25 DIAGNOSIS — L89322 Pressure ulcer of left buttock, stage 2: Secondary | ICD-10-CM | POA: Diagnosis not present

## 2020-07-25 DIAGNOSIS — R338 Other retention of urine: Secondary | ICD-10-CM | POA: Diagnosis not present

## 2020-07-25 DIAGNOSIS — R202 Paresthesia of skin: Secondary | ICD-10-CM | POA: Diagnosis not present

## 2020-07-25 DIAGNOSIS — Z79891 Long term (current) use of opiate analgesic: Secondary | ICD-10-CM | POA: Diagnosis not present

## 2020-07-25 DIAGNOSIS — Z9181 History of falling: Secondary | ICD-10-CM | POA: Diagnosis not present

## 2020-07-25 DIAGNOSIS — K5900 Constipation, unspecified: Secondary | ICD-10-CM | POA: Diagnosis not present

## 2020-07-25 DIAGNOSIS — G4730 Sleep apnea, unspecified: Secondary | ICD-10-CM | POA: Diagnosis not present

## 2020-07-25 DIAGNOSIS — E782 Mixed hyperlipidemia: Secondary | ICD-10-CM | POA: Diagnosis not present

## 2020-07-25 DIAGNOSIS — F32A Depression, unspecified: Secondary | ICD-10-CM | POA: Diagnosis not present

## 2020-07-25 DIAGNOSIS — G2581 Restless legs syndrome: Secondary | ICD-10-CM | POA: Diagnosis not present

## 2020-07-25 DIAGNOSIS — M21371 Foot drop, right foot: Secondary | ICD-10-CM | POA: Diagnosis not present

## 2020-07-25 DIAGNOSIS — I10 Essential (primary) hypertension: Secondary | ICD-10-CM | POA: Diagnosis not present

## 2020-07-25 DIAGNOSIS — Z466 Encounter for fitting and adjustment of urinary device: Secondary | ICD-10-CM | POA: Diagnosis not present

## 2020-07-25 DIAGNOSIS — I700 Atherosclerosis of aorta: Secondary | ICD-10-CM | POA: Diagnosis not present

## 2020-07-25 DIAGNOSIS — G629 Polyneuropathy, unspecified: Secondary | ICD-10-CM | POA: Diagnosis not present

## 2020-07-25 DIAGNOSIS — M1991 Primary osteoarthritis, unspecified site: Secondary | ICD-10-CM | POA: Diagnosis not present

## 2020-07-26 DIAGNOSIS — G2581 Restless legs syndrome: Secondary | ICD-10-CM | POA: Diagnosis not present

## 2020-07-26 DIAGNOSIS — G894 Chronic pain syndrome: Secondary | ICD-10-CM | POA: Diagnosis not present

## 2020-07-26 DIAGNOSIS — G629 Polyneuropathy, unspecified: Secondary | ICD-10-CM | POA: Diagnosis not present

## 2020-07-26 DIAGNOSIS — M1991 Primary osteoarthritis, unspecified site: Secondary | ICD-10-CM | POA: Diagnosis not present

## 2020-07-26 DIAGNOSIS — M21371 Foot drop, right foot: Secondary | ICD-10-CM | POA: Diagnosis not present

## 2020-07-26 DIAGNOSIS — F32A Depression, unspecified: Secondary | ICD-10-CM | POA: Diagnosis not present

## 2020-07-26 DIAGNOSIS — I700 Atherosclerosis of aorta: Secondary | ICD-10-CM | POA: Diagnosis not present

## 2020-07-26 DIAGNOSIS — Z87442 Personal history of urinary calculi: Secondary | ICD-10-CM | POA: Diagnosis not present

## 2020-07-26 DIAGNOSIS — I10 Essential (primary) hypertension: Secondary | ICD-10-CM | POA: Diagnosis not present

## 2020-07-26 DIAGNOSIS — Z466 Encounter for fitting and adjustment of urinary device: Secondary | ICD-10-CM | POA: Diagnosis not present

## 2020-07-26 DIAGNOSIS — L89322 Pressure ulcer of left buttock, stage 2: Secondary | ICD-10-CM | POA: Diagnosis not present

## 2020-07-26 DIAGNOSIS — Z79891 Long term (current) use of opiate analgesic: Secondary | ICD-10-CM | POA: Diagnosis not present

## 2020-07-26 DIAGNOSIS — R202 Paresthesia of skin: Secondary | ICD-10-CM | POA: Diagnosis not present

## 2020-07-26 DIAGNOSIS — R338 Other retention of urine: Secondary | ICD-10-CM | POA: Diagnosis not present

## 2020-07-26 DIAGNOSIS — G4730 Sleep apnea, unspecified: Secondary | ICD-10-CM | POA: Diagnosis not present

## 2020-07-26 DIAGNOSIS — K5900 Constipation, unspecified: Secondary | ICD-10-CM | POA: Diagnosis not present

## 2020-07-26 DIAGNOSIS — E782 Mixed hyperlipidemia: Secondary | ICD-10-CM | POA: Diagnosis not present

## 2020-07-26 DIAGNOSIS — Z9181 History of falling: Secondary | ICD-10-CM | POA: Diagnosis not present

## 2020-07-27 DIAGNOSIS — R338 Other retention of urine: Secondary | ICD-10-CM | POA: Diagnosis not present

## 2020-07-27 DIAGNOSIS — I10 Essential (primary) hypertension: Secondary | ICD-10-CM | POA: Diagnosis not present

## 2020-07-27 DIAGNOSIS — Z9181 History of falling: Secondary | ICD-10-CM | POA: Diagnosis not present

## 2020-07-27 DIAGNOSIS — G629 Polyneuropathy, unspecified: Secondary | ICD-10-CM | POA: Diagnosis not present

## 2020-07-27 DIAGNOSIS — L89322 Pressure ulcer of left buttock, stage 2: Secondary | ICD-10-CM | POA: Diagnosis not present

## 2020-07-27 DIAGNOSIS — M1991 Primary osteoarthritis, unspecified site: Secondary | ICD-10-CM | POA: Diagnosis not present

## 2020-07-27 DIAGNOSIS — I700 Atherosclerosis of aorta: Secondary | ICD-10-CM | POA: Diagnosis not present

## 2020-07-27 DIAGNOSIS — E782 Mixed hyperlipidemia: Secondary | ICD-10-CM | POA: Diagnosis not present

## 2020-07-27 DIAGNOSIS — G4730 Sleep apnea, unspecified: Secondary | ICD-10-CM | POA: Diagnosis not present

## 2020-07-27 DIAGNOSIS — Z79891 Long term (current) use of opiate analgesic: Secondary | ICD-10-CM | POA: Diagnosis not present

## 2020-07-27 DIAGNOSIS — M21371 Foot drop, right foot: Secondary | ICD-10-CM | POA: Diagnosis not present

## 2020-07-27 DIAGNOSIS — G2581 Restless legs syndrome: Secondary | ICD-10-CM | POA: Diagnosis not present

## 2020-07-27 DIAGNOSIS — K5900 Constipation, unspecified: Secondary | ICD-10-CM | POA: Diagnosis not present

## 2020-07-27 DIAGNOSIS — Z87442 Personal history of urinary calculi: Secondary | ICD-10-CM | POA: Diagnosis not present

## 2020-07-27 DIAGNOSIS — G894 Chronic pain syndrome: Secondary | ICD-10-CM | POA: Diagnosis not present

## 2020-07-27 DIAGNOSIS — R202 Paresthesia of skin: Secondary | ICD-10-CM | POA: Diagnosis not present

## 2020-07-27 DIAGNOSIS — Z466 Encounter for fitting and adjustment of urinary device: Secondary | ICD-10-CM | POA: Diagnosis not present

## 2020-07-27 DIAGNOSIS — F32A Depression, unspecified: Secondary | ICD-10-CM | POA: Diagnosis not present

## 2020-07-29 ENCOUNTER — Other Ambulatory Visit: Payer: Self-pay

## 2020-07-29 ENCOUNTER — Encounter (HOSPITAL_COMMUNITY): Payer: Self-pay | Admitting: *Deleted

## 2020-07-29 ENCOUNTER — Emergency Department (HOSPITAL_COMMUNITY)
Admission: EM | Admit: 2020-07-29 | Discharge: 2020-07-29 | Disposition: A | Discharge status: Home / Self Care | Payer: Medicare Other | Attending: Emergency Medicine | Admitting: Emergency Medicine

## 2020-07-29 DIAGNOSIS — I10 Essential (primary) hypertension: Secondary | ICD-10-CM | POA: Insufficient documentation

## 2020-07-29 DIAGNOSIS — Z79899 Other long term (current) drug therapy: Secondary | ICD-10-CM | POA: Insufficient documentation

## 2020-07-29 DIAGNOSIS — L89322 Pressure ulcer of left buttock, stage 2: Secondary | ICD-10-CM | POA: Diagnosis not present

## 2020-07-29 DIAGNOSIS — R310 Gross hematuria: Secondary | ICD-10-CM

## 2020-07-29 DIAGNOSIS — T839XXA Unspecified complication of genitourinary prosthetic device, implant and graft, initial encounter: Secondary | ICD-10-CM

## 2020-07-29 DIAGNOSIS — G629 Polyneuropathy, unspecified: Secondary | ICD-10-CM | POA: Diagnosis not present

## 2020-07-29 DIAGNOSIS — Z87442 Personal history of urinary calculi: Secondary | ICD-10-CM | POA: Diagnosis not present

## 2020-07-29 DIAGNOSIS — T83098A Other mechanical complication of other indwelling urethral catheter, initial encounter: Secondary | ICD-10-CM | POA: Diagnosis not present

## 2020-07-29 DIAGNOSIS — E782 Mixed hyperlipidemia: Secondary | ICD-10-CM | POA: Diagnosis not present

## 2020-07-29 DIAGNOSIS — M21371 Foot drop, right foot: Secondary | ICD-10-CM | POA: Diagnosis not present

## 2020-07-29 DIAGNOSIS — Y846 Urinary catheterization as the cause of abnormal reaction of the patient, or of later complication, without mention of misadventure at the time of the procedure: Secondary | ICD-10-CM | POA: Insufficient documentation

## 2020-07-29 DIAGNOSIS — I700 Atherosclerosis of aorta: Secondary | ICD-10-CM | POA: Diagnosis not present

## 2020-07-29 DIAGNOSIS — Z466 Encounter for fitting and adjustment of urinary device: Secondary | ICD-10-CM | POA: Diagnosis not present

## 2020-07-29 DIAGNOSIS — Z79891 Long term (current) use of opiate analgesic: Secondary | ICD-10-CM | POA: Diagnosis not present

## 2020-07-29 DIAGNOSIS — Z87891 Personal history of nicotine dependence: Secondary | ICD-10-CM | POA: Diagnosis not present

## 2020-07-29 DIAGNOSIS — R52 Pain, unspecified: Secondary | ICD-10-CM | POA: Diagnosis not present

## 2020-07-29 DIAGNOSIS — Z743 Need for continuous supervision: Secondary | ICD-10-CM | POA: Diagnosis not present

## 2020-07-29 DIAGNOSIS — G894 Chronic pain syndrome: Secondary | ICD-10-CM | POA: Diagnosis not present

## 2020-07-29 DIAGNOSIS — R338 Other retention of urine: Secondary | ICD-10-CM | POA: Diagnosis not present

## 2020-07-29 DIAGNOSIS — G2581 Restless legs syndrome: Secondary | ICD-10-CM | POA: Diagnosis not present

## 2020-07-29 DIAGNOSIS — F32A Depression, unspecified: Secondary | ICD-10-CM | POA: Diagnosis not present

## 2020-07-29 DIAGNOSIS — Z9181 History of falling: Secondary | ICD-10-CM | POA: Diagnosis not present

## 2020-07-29 DIAGNOSIS — M1991 Primary osteoarthritis, unspecified site: Secondary | ICD-10-CM | POA: Diagnosis not present

## 2020-07-29 DIAGNOSIS — T83091A Other mechanical complication of indwelling urethral catheter, initial encounter: Secondary | ICD-10-CM | POA: Insufficient documentation

## 2020-07-29 DIAGNOSIS — K5900 Constipation, unspecified: Secondary | ICD-10-CM | POA: Diagnosis not present

## 2020-07-29 DIAGNOSIS — R319 Hematuria, unspecified: Secondary | ICD-10-CM | POA: Diagnosis not present

## 2020-07-29 DIAGNOSIS — R202 Paresthesia of skin: Secondary | ICD-10-CM | POA: Diagnosis not present

## 2020-07-29 DIAGNOSIS — G4730 Sleep apnea, unspecified: Secondary | ICD-10-CM | POA: Diagnosis not present

## 2020-07-29 LAB — COMPREHENSIVE METABOLIC PANEL
ALT: 19 U/L (ref 0–44)
AST: 19 U/L (ref 15–41)
Albumin: 3.4 g/dL — ABNORMAL LOW (ref 3.5–5.0)
Alkaline Phosphatase: 93 U/L (ref 38–126)
Anion gap: 9 (ref 5–15)
BUN: 22 mg/dL (ref 8–23)
CO2: 29 mmol/L (ref 22–32)
Calcium: 9.2 mg/dL (ref 8.9–10.3)
Chloride: 97 mmol/L — ABNORMAL LOW (ref 98–111)
Creatinine, Ser: 1.08 mg/dL (ref 0.61–1.24)
GFR, Estimated: >60 mL/min (ref >60)
Glucose, Bld: 102 mg/dL — ABNORMAL HIGH (ref 70–99)
Potassium: 4.3 mmol/L (ref 3.5–5.1)
Sodium: 135 mmol/L (ref 135–145)
Total Bilirubin: 0.7 mg/dL (ref 0.3–1.2)
Total Protein: 6.9 g/dL (ref 6.5–8.1)

## 2020-07-29 LAB — CBC WITH DIFFERENTIAL/PLATELET
Abs Immature Granulocytes: 0.04 10*3/uL (ref 0.00–0.07)
Basophils Absolute: 0 10*3/uL (ref 0.0–0.1)
Basophils Relative: 1 %
Eosinophils Absolute: 0.1 10*3/uL (ref 0.0–0.5)
Eosinophils Relative: 1 %
HCT: 40.6 % (ref 39.0–52.0)
Hemoglobin: 13.3 g/dL (ref 13.0–17.0)
Immature Granulocytes: 1 %
Lymphocytes Relative: 13 %
Lymphs Abs: 1.1 10*3/uL (ref 0.7–4.0)
MCH: 29.8 pg (ref 26.0–34.0)
MCHC: 32.8 g/dL (ref 30.0–36.0)
MCV: 90.8 fL (ref 80.0–100.0)
Monocytes Absolute: 0.7 10*3/uL (ref 0.1–1.0)
Monocytes Relative: 9 %
Neutro Abs: 6.2 10*3/uL (ref 1.7–7.7)
Neutrophils Relative %: 75 %
Platelets: 214 10*3/uL (ref 150–400)
RBC: 4.47 MIL/uL (ref 4.22–5.81)
RDW: 12.5 % (ref 11.5–15.5)
WBC: 8.1 10*3/uL (ref 4.0–10.5)
nRBC: 0 % (ref 0.0–0.2)

## 2020-07-29 LAB — PROTIME-INR
INR: 1.1 (ref 0.8–1.2)
Prothrombin Time: 13.8 seconds (ref 11.4–15.2)

## 2020-07-29 LAB — URINALYSIS, ROUTINE W REFLEX MICROSCOPIC
Bilirubin Urine: NEGATIVE
Glucose, UA: NEGATIVE mg/dL
Ketones, ur: 5 mg/dL — AB
Nitrite: NEGATIVE
Protein, ur: 100 mg/dL — AB
RBC / HPF: >50 RBC/hpf — ABNORMAL HIGH (ref 0–5)
Specific Gravity, Urine: 1.012 (ref 1.005–1.030)
pH: 6 (ref 5.0–8.0)

## 2020-07-29 MED ORDER — SODIUM CHLORIDE 0.9 % IV BOLUS
1000.0000 mL | Freq: Once | INTRAVENOUS | Status: AC
  Administered 2020-07-29: 1000 mL via INTRAVENOUS

## 2020-07-29 NOTE — ED Notes (Signed)
New catheter placed but stlll leaking. EDP made aware and to try bigger catheter.

## 2020-07-29 NOTE — ED Provider Notes (Signed)
[facility] Provider Note   CSN: [id number] Arrival date & time: 07/29/20  1748     History No chief complaint on file.   ZOLTON DOWSON is a 70 y.o. male.  HPI 70 year old male presents with hematuria.  Started yesterday.  Has a Foley catheter for urinary retention.  The urine output has otherwise been normal.  No dysuria, abdominal or back pain.  No fevers.  He otherwise feels well.  He is not on blood thinners.  Past Medical History:  Diagnosis Date   Arthritis    Chronic back pain    Chronic pain 1999   Bilateral feet (R >L)   Difficult or painful urination    High blood pressure    History of kidney stones    H/O   Nerve damage    Pain management    Paresthesia of foot, bilateral    Renal disorder    Sleep apnea    USES CPAP   Weakness of both legs     Patient Active Problem List   Diagnosis Date Noted   Generalized weakness 04/30/2020   Constipation 04/30/2020   Nephrolithiasis 04/30/2020   Pressure injury of skin 04/30/2020   Acute urinary retention 04/29/2020   Pain in limb 62/69/4854   Acute metabolic encephalopathy 62/70/3500   Bilateral hydronephrosis 03/07/2019   Altered mental status 03/07/2019   Hypertension    Abnormal ECG 02/28/2019   Atherosclerosis of abdominal aorta ([place]) 02/28/2019   Benign essential HTN 02/28/2019   Hyperlipidemia, mixed 02/28/2019   Neuropathy 01/21/2017   Pain in right foot 01/21/2017   Pain in left foot 01/21/2017   Chronic pain syndrome 01/21/2017   Post laminectomy syndrome 01/21/2017   Status post insertion of spinal cord stimulator 93/81/8299   Uncomplicated opioid dependence ([place]) 01/21/2017   Chronic bilateral low back pain without sciatica 11/27/2015   Degenerative disc disease, lumbar 06/17/2015   Encounter for therapeutic drug monitoring 06/17/2015   Nervous system device, implant, or graft complication, initial encounter 04/06/2014   Lumbosacral radiculopathy 12/09/2012    Past  Surgical History:  Procedure Laterality Date   arm surgery     BACK SURGERY     X2   FACIAL COSMETIC SURGERY     HERNIA REPAIR     IR US GUIDE BX ASP/DRAIN  10/19/2017   KIDNEY STONE SURGERY     LUMBAR SPINAL CORD SIMULATOR LEAD REMOVAL N/A 03/01/2019   Procedure: LUMBAR SPINAL CORD SIMULATOR LEAD REMOVAL;  Surgeon: Meade Maw, MD;  Location: [facility] ORS;  Service: Neurosurgery;  Laterality: N/A;   MASTECTOMY Left    SPINAL CORD STIMULATOR REMOVAL N/A 03/01/2019   Procedure: LUMBAR SPINAL CORD STIMULATOR REMOVAL;  Surgeon: Meade Maw, MD;  Location: [facility] ORS;  Service: Neurosurgery;  Laterality: N/A;       Family History  Problem Relation Age of Onset   Heart disease Father        Living, 23   Breast cancer Mother        Living, 24   Hypercholesterolemia Mother    Healthy Brother    Healthy Sister     Social History   Tobacco Use   Smoking status: Former    Packs/day: 2.00    Years: 10.00    Pack years: 20.00    Types: Cigarettes    Quit date: 05/21/1991    Years since quitting: 29.2   Smokeless tobacco: Former    Quit date: 06/08/1991  Vaping Use   Vaping Use:  Never used  Substance Use Topics   Alcohol use: No   Drug use: No    Home Medications Prior to Admission medications   Medication Sig Start Date End Date Taking? Authorizing Provider  ARIPiprazole (ABILIFY) 5 MG tablet Take 1 tablet (5 mg total) by mouth daily. 05/17/20   Medina-Vargas, Monina C, NP  cholecalciferol (VITAMIN D3) 25 MCG (1000 UNIT) tablet Take 1 tablet (1,000 Units total) by mouth daily. 05/17/20   Medina-Vargas, Monina C, NP  diltiazem (CARDIZEM CD) 120 MG 24 hr capsule Take 1 capsule (120 mg total) by mouth every evening. 05/17/20   Medina-Vargas, Monina C, NP  docusate sodium (COLACE) 100 MG capsule Take 1 capsule (100 mg total) by mouth in the morning, at noon, and at bedtime. 05/17/20   Medina-Vargas, Monina C, NP  finasteride (PROSCAR) 5 MG tablet Take 1 tablet (5 mg total) by mouth  daily. 05/17/20   Medina-Vargas, Monina C, NP  mirabegron ER (MYRBETRIQ) 50 MG TB24 tablet Take 1 tablet (50 mg total) by mouth daily. 05/17/20   Medina-Vargas, Monina C, NP  oxyCODONE (ROXICODONE) 15 MG immediate release tablet Take 1 tablet (15 mg total) by mouth every 6 (six) hours as needed for pain. 05/17/20   Medina-Vargas, Monina C, NP  polyethylene glycol (MIRALAX / GLYCOLAX) 17 g packet Take 17 g by mouth 2 (two) times daily. 05/17/20   Medina-Vargas, Monina C, NP  senna-docusate (SENOKOT-S) 8.6-50 MG tablet Take 1 tablet by mouth 2 (two) times daily. 05/02/20   Georgette Shell, MD  tamsulosin (FLOMAX) 0.4 MG CAPS capsule Take 1 capsule (0.4 mg total) by mouth every evening. 05/17/20   Medina-Vargas, Monina C, NP  tolterodine (DETROL) 1 MG tablet Take 1 tablet (1 mg total) by mouth 2 (two) times daily. 05/17/20   Medina-Vargas, Monina C, NP  traZODone (DESYREL) 50 MG tablet Take 1 tablet (50 mg total) by mouth at bedtime. 05/17/20   Medina-Vargas, Monina C, NP  venlafaxine XR (EFFEXOR-XR) 75 MG 24 hr capsule Take 1 capsule (75 mg total) by mouth in the morning and at bedtime. 05/17/20   Medina-Vargas, Monina C, NP  vitamin B-12 (CYANOCOBALAMIN) 500 MCG tablet Take 1 tablet (500 mcg total) by mouth daily. 05/17/20   Medina-Vargas, Monina C, NP    Allergies    Erythromycin  Review of Systems   Review of Systems  Constitutional:  Negative for fever.  Gastrointestinal:  Negative for abdominal pain.  Genitourinary:  Positive for hematuria. Negative for decreased urine volume, difficulty urinating, dysuria and flank pain.  Musculoskeletal:  Negative for back pain.  All other systems reviewed and are negative.  Physical Exam Updated Vital Signs BP 114/77   Pulse 69   Temp 98.2 F (36.8 C) (Oral)   Resp 18   Ht 5\' 8"  (1.727 m)   Wt 85.7 kg   SpO2 97%   BMI 28.74 kg/m   Physical Exam Vitals and nursing note reviewed.  Constitutional:      General: He is not in acute distress.    Appearance:  He is well-developed. He is not ill-appearing or diaphoretic.  HENT:     Head: Normocephalic and atraumatic.     Right Ear: External ear normal.     Left Ear: External ear normal.     Nose: Nose normal.  Eyes:     General:        Right eye: No discharge.        Left eye: No discharge.  Cardiovascular:  Rate and Rhythm: Normal rate and regular rhythm.     Heart sounds: Normal heart sounds.  Pulmonary:     Effort: Pulmonary effort is normal.     Breath sounds: Normal breath sounds.  Abdominal:     General: There is no distension.     Palpations: Abdomen is soft.     Tenderness: There is no abdominal tenderness.  Genitourinary:    Comments: Blood in foley/tubing Musculoskeletal:     Cervical back: Neck supple.  Skin:    General: Skin is warm and dry.  Neurological:     Mental Status: He is alert.  Psychiatric:        Mood and Affect: Mood is not anxious.    ED Results / Procedures / Treatments   Labs (all labs ordered are listed, but only abnormal results are displayed) Labs Reviewed  COMPREHENSIVE METABOLIC PANEL - Abnormal; Notable for the following components:      Result Value   Chloride 97 (*)    Glucose, Bld 102 (*)    Albumin 3.4 (*)    All other components within normal limits  URINALYSIS, ROUTINE W REFLEX MICROSCOPIC - Abnormal; Notable for the following components:   Color, Urine AMBER (*)    APPearance HAZY (*)    Hgb urine dipstick LARGE (*)    Ketones, ur 5 (*)    Protein, ur 100 (*)    Leukocytes,Ua TRACE (*)    RBC / HPF >50 (*)    Bacteria, UA RARE (*)    All other components within normal limits  URINE CULTURE  CBC WITH DIFFERENTIAL/PLATELET  PROTIME-INR    EKG None  Radiology No results found.  Procedures Procedures   Medications Ordered in ED Medications  sodium chloride 0.9 % bolus 1,000 mL (0 mLs Intravenous Stopped 07/29/20 2119)    ED Course  I have reviewed the triage vital signs and the nursing notes.  Pertinent labs &  imaging results that were available during my care of the patient were reviewed by me and considered in my medical decision making (see chart for details).    MDM Rules/Calculators/A&P                          Patient appears to be having urine and blood come out around the catheter.  Foley catheter was changed and this is still going on.  He was then upsized and this problem has now been resolved.  Still has hematuria of unclear etiology.  He has no back/flank pain so I think stone is unlikely.  His vitals are stable and labs are stable.  There is no obvious UTI but with his complex urologic history I will also send for culture.  I do not think immediate CT scanning is needed but he can follow-up with his urologist. Final Clinical Impression(s) / ED Diagnoses Final diagnoses:  Gross hematuria  Foley catheter problem, initial encounter [facility])    Rx / DC Orders ED Discharge Orders     None        Sherwood Gambler, MD 07/29/20 2321

## 2020-07-29 NOTE — ED Notes (Signed)
Catheter irrigated and saline coming back out around catheter tube. EDP made aware and new orders to follow.

## 2020-07-29 NOTE — ED Triage Notes (Signed)
Pt brought in by [facility] for c/o blood in urine that started yesterday; pt has a catheter due to sepsis in the past

## 2020-07-30 DIAGNOSIS — I700 Atherosclerosis of aorta: Secondary | ICD-10-CM | POA: Diagnosis not present

## 2020-07-30 DIAGNOSIS — G4730 Sleep apnea, unspecified: Secondary | ICD-10-CM | POA: Diagnosis not present

## 2020-07-30 DIAGNOSIS — G2581 Restless legs syndrome: Secondary | ICD-10-CM | POA: Diagnosis not present

## 2020-07-30 DIAGNOSIS — Z79891 Long term (current) use of opiate analgesic: Secondary | ICD-10-CM | POA: Diagnosis not present

## 2020-07-30 DIAGNOSIS — Z9181 History of falling: Secondary | ICD-10-CM | POA: Diagnosis not present

## 2020-07-30 DIAGNOSIS — E782 Mixed hyperlipidemia: Secondary | ICD-10-CM | POA: Diagnosis not present

## 2020-07-30 DIAGNOSIS — M21371 Foot drop, right foot: Secondary | ICD-10-CM | POA: Diagnosis not present

## 2020-07-30 DIAGNOSIS — M1991 Primary osteoarthritis, unspecified site: Secondary | ICD-10-CM | POA: Diagnosis not present

## 2020-07-30 DIAGNOSIS — R202 Paresthesia of skin: Secondary | ICD-10-CM | POA: Diagnosis not present

## 2020-07-30 DIAGNOSIS — G894 Chronic pain syndrome: Secondary | ICD-10-CM | POA: Diagnosis not present

## 2020-07-30 DIAGNOSIS — I10 Essential (primary) hypertension: Secondary | ICD-10-CM | POA: Diagnosis not present

## 2020-07-30 DIAGNOSIS — L89322 Pressure ulcer of left buttock, stage 2: Secondary | ICD-10-CM | POA: Diagnosis not present

## 2020-07-30 DIAGNOSIS — F32A Depression, unspecified: Secondary | ICD-10-CM | POA: Diagnosis not present

## 2020-07-30 DIAGNOSIS — Z466 Encounter for fitting and adjustment of urinary device: Secondary | ICD-10-CM | POA: Diagnosis not present

## 2020-07-30 DIAGNOSIS — R338 Other retention of urine: Secondary | ICD-10-CM | POA: Diagnosis not present

## 2020-07-30 DIAGNOSIS — G629 Polyneuropathy, unspecified: Secondary | ICD-10-CM | POA: Diagnosis not present

## 2020-07-30 DIAGNOSIS — Z87442 Personal history of urinary calculi: Secondary | ICD-10-CM | POA: Diagnosis not present

## 2020-07-30 DIAGNOSIS — K5900 Constipation, unspecified: Secondary | ICD-10-CM | POA: Diagnosis not present

## 2020-07-31 DIAGNOSIS — I10 Essential (primary) hypertension: Secondary | ICD-10-CM | POA: Diagnosis not present

## 2020-07-31 DIAGNOSIS — F32A Depression, unspecified: Secondary | ICD-10-CM | POA: Diagnosis not present

## 2020-07-31 DIAGNOSIS — E782 Mixed hyperlipidemia: Secondary | ICD-10-CM | POA: Diagnosis not present

## 2020-07-31 DIAGNOSIS — G2581 Restless legs syndrome: Secondary | ICD-10-CM | POA: Diagnosis not present

## 2020-07-31 DIAGNOSIS — Z79891 Long term (current) use of opiate analgesic: Secondary | ICD-10-CM | POA: Diagnosis not present

## 2020-07-31 DIAGNOSIS — Z87442 Personal history of urinary calculi: Secondary | ICD-10-CM | POA: Diagnosis not present

## 2020-07-31 DIAGNOSIS — Z466 Encounter for fitting and adjustment of urinary device: Secondary | ICD-10-CM | POA: Diagnosis not present

## 2020-07-31 DIAGNOSIS — L89322 Pressure ulcer of left buttock, stage 2: Secondary | ICD-10-CM | POA: Diagnosis not present

## 2020-07-31 DIAGNOSIS — M21371 Foot drop, right foot: Secondary | ICD-10-CM | POA: Diagnosis not present

## 2020-07-31 DIAGNOSIS — G629 Polyneuropathy, unspecified: Secondary | ICD-10-CM | POA: Diagnosis not present

## 2020-07-31 DIAGNOSIS — G894 Chronic pain syndrome: Secondary | ICD-10-CM | POA: Diagnosis not present

## 2020-07-31 DIAGNOSIS — R338 Other retention of urine: Secondary | ICD-10-CM | POA: Diagnosis not present

## 2020-07-31 DIAGNOSIS — K5900 Constipation, unspecified: Secondary | ICD-10-CM | POA: Diagnosis not present

## 2020-07-31 DIAGNOSIS — I700 Atherosclerosis of aorta: Secondary | ICD-10-CM | POA: Diagnosis not present

## 2020-07-31 DIAGNOSIS — G4730 Sleep apnea, unspecified: Secondary | ICD-10-CM | POA: Diagnosis not present

## 2020-07-31 DIAGNOSIS — Z9181 History of falling: Secondary | ICD-10-CM | POA: Diagnosis not present

## 2020-07-31 DIAGNOSIS — R202 Paresthesia of skin: Secondary | ICD-10-CM | POA: Diagnosis not present

## 2020-07-31 DIAGNOSIS — R0601 Orthopnea: Secondary | ICD-10-CM | POA: Diagnosis not present

## 2020-07-31 DIAGNOSIS — Z7409 Other reduced mobility: Secondary | ICD-10-CM | POA: Diagnosis not present

## 2020-07-31 DIAGNOSIS — M1991 Primary osteoarthritis, unspecified site: Secondary | ICD-10-CM | POA: Diagnosis not present

## 2020-08-01 DIAGNOSIS — G2581 Restless legs syndrome: Secondary | ICD-10-CM | POA: Diagnosis not present

## 2020-08-01 DIAGNOSIS — G4730 Sleep apnea, unspecified: Secondary | ICD-10-CM | POA: Diagnosis not present

## 2020-08-01 DIAGNOSIS — M21371 Foot drop, right foot: Secondary | ICD-10-CM | POA: Diagnosis not present

## 2020-08-01 DIAGNOSIS — K5900 Constipation, unspecified: Secondary | ICD-10-CM | POA: Diagnosis not present

## 2020-08-01 DIAGNOSIS — I10 Essential (primary) hypertension: Secondary | ICD-10-CM | POA: Diagnosis not present

## 2020-08-01 DIAGNOSIS — E782 Mixed hyperlipidemia: Secondary | ICD-10-CM | POA: Diagnosis not present

## 2020-08-01 DIAGNOSIS — F32A Depression, unspecified: Secondary | ICD-10-CM | POA: Diagnosis not present

## 2020-08-01 DIAGNOSIS — I700 Atherosclerosis of aorta: Secondary | ICD-10-CM | POA: Diagnosis not present

## 2020-08-01 DIAGNOSIS — Z9181 History of falling: Secondary | ICD-10-CM | POA: Diagnosis not present

## 2020-08-01 DIAGNOSIS — R202 Paresthesia of skin: Secondary | ICD-10-CM | POA: Diagnosis not present

## 2020-08-01 DIAGNOSIS — L89322 Pressure ulcer of left buttock, stage 2: Secondary | ICD-10-CM | POA: Diagnosis not present

## 2020-08-01 DIAGNOSIS — R338 Other retention of urine: Secondary | ICD-10-CM | POA: Diagnosis not present

## 2020-08-01 DIAGNOSIS — Z87442 Personal history of urinary calculi: Secondary | ICD-10-CM | POA: Diagnosis not present

## 2020-08-01 DIAGNOSIS — G894 Chronic pain syndrome: Secondary | ICD-10-CM | POA: Diagnosis not present

## 2020-08-01 DIAGNOSIS — Z466 Encounter for fitting and adjustment of urinary device: Secondary | ICD-10-CM | POA: Diagnosis not present

## 2020-08-01 DIAGNOSIS — M1991 Primary osteoarthritis, unspecified site: Secondary | ICD-10-CM | POA: Diagnosis not present

## 2020-08-01 DIAGNOSIS — Z79891 Long term (current) use of opiate analgesic: Secondary | ICD-10-CM | POA: Diagnosis not present

## 2020-08-01 DIAGNOSIS — G629 Polyneuropathy, unspecified: Secondary | ICD-10-CM | POA: Diagnosis not present

## 2020-08-01 LAB — URINE CULTURE: Culture: 70000 — AB

## 2020-08-02 ENCOUNTER — Telehealth: Payer: Self-pay | Admitting: *Deleted

## 2020-08-02 NOTE — Telephone Encounter (Signed)
Post ED Visit - Positive Culture Follow-up  Culture report reviewed by antimicrobial stewardship pharmacist: [place] Team []  Elenor Quinones, Pharm.D. []  Heide Guile, Pharm.D., BCPS AQ-ID []  Parks Neptune, Pharm.D., BCPS []  Alycia Rossetti, Pharm.D., BCPS []  [place], Pharm.D., BCPS, AAHIVP []  Legrand Como, Pharm.D., BCPS, AAHIVP []  Salome Arnt, PharmD, BCPS []  Johnnette Gourd, PharmD, BCPS []  Hughes Better, PharmD, BCPS []  Leeroy Cha, PharmD []  Laqueta Linden, PharmD, BCPS []  Albertina Parr, PharmD  [place] Team []  Leodis Sias, PharmD []  Lindell Spar, PharmD []  Royetta Asal, PharmD []  Graylin Shiver, Rph []  Rema Fendt) Glennon Mac, PharmD []  Arlyn Dunning, PharmD []  Netta Cedars, PharmD []  Dia Sitter, PharmD []  Leone Haven, PharmD []  Gretta Arab, PharmD []  Theodis Shove, PharmD []  Peggyann Juba, PharmD []  Reuel Boom, PharmD   Positive urine culture No leukocytosis, afebrile and no further patient follow-up is required at this time.Mercy Riding, Pharm D  Harlon Flor Wortham 08/02/2020, 11:04 AM

## 2020-08-05 DIAGNOSIS — K5900 Constipation, unspecified: Secondary | ICD-10-CM | POA: Diagnosis not present

## 2020-08-05 DIAGNOSIS — Z87442 Personal history of urinary calculi: Secondary | ICD-10-CM | POA: Diagnosis not present

## 2020-08-05 DIAGNOSIS — Z9181 History of falling: Secondary | ICD-10-CM | POA: Diagnosis not present

## 2020-08-05 DIAGNOSIS — Z79891 Long term (current) use of opiate analgesic: Secondary | ICD-10-CM | POA: Diagnosis not present

## 2020-08-05 DIAGNOSIS — I10 Essential (primary) hypertension: Secondary | ICD-10-CM | POA: Diagnosis not present

## 2020-08-05 DIAGNOSIS — L89322 Pressure ulcer of left buttock, stage 2: Secondary | ICD-10-CM | POA: Diagnosis not present

## 2020-08-05 DIAGNOSIS — I700 Atherosclerosis of aorta: Secondary | ICD-10-CM | POA: Diagnosis not present

## 2020-08-05 DIAGNOSIS — G2581 Restless legs syndrome: Secondary | ICD-10-CM | POA: Diagnosis not present

## 2020-08-05 DIAGNOSIS — Z466 Encounter for fitting and adjustment of urinary device: Secondary | ICD-10-CM | POA: Diagnosis not present

## 2020-08-05 DIAGNOSIS — M21371 Foot drop, right foot: Secondary | ICD-10-CM | POA: Diagnosis not present

## 2020-08-05 DIAGNOSIS — G629 Polyneuropathy, unspecified: Secondary | ICD-10-CM | POA: Diagnosis not present

## 2020-08-05 DIAGNOSIS — F32A Depression, unspecified: Secondary | ICD-10-CM | POA: Diagnosis not present

## 2020-08-05 DIAGNOSIS — E782 Mixed hyperlipidemia: Secondary | ICD-10-CM | POA: Diagnosis not present

## 2020-08-05 DIAGNOSIS — R338 Other retention of urine: Secondary | ICD-10-CM | POA: Diagnosis not present

## 2020-08-05 DIAGNOSIS — R202 Paresthesia of skin: Secondary | ICD-10-CM | POA: Diagnosis not present

## 2020-08-05 DIAGNOSIS — G894 Chronic pain syndrome: Secondary | ICD-10-CM | POA: Diagnosis not present

## 2020-08-05 DIAGNOSIS — G4730 Sleep apnea, unspecified: Secondary | ICD-10-CM | POA: Diagnosis not present

## 2020-08-05 DIAGNOSIS — M1991 Primary osteoarthritis, unspecified site: Secondary | ICD-10-CM | POA: Diagnosis not present

## 2020-08-06 DIAGNOSIS — R338 Other retention of urine: Secondary | ICD-10-CM | POA: Diagnosis not present

## 2020-08-06 DIAGNOSIS — K5900 Constipation, unspecified: Secondary | ICD-10-CM | POA: Diagnosis not present

## 2020-08-06 DIAGNOSIS — L89322 Pressure ulcer of left buttock, stage 2: Secondary | ICD-10-CM | POA: Diagnosis not present

## 2020-08-06 DIAGNOSIS — M1991 Primary osteoarthritis, unspecified site: Secondary | ICD-10-CM | POA: Diagnosis not present

## 2020-08-06 DIAGNOSIS — G2581 Restless legs syndrome: Secondary | ICD-10-CM | POA: Diagnosis not present

## 2020-08-06 DIAGNOSIS — Z79891 Long term (current) use of opiate analgesic: Secondary | ICD-10-CM | POA: Diagnosis not present

## 2020-08-06 DIAGNOSIS — R202 Paresthesia of skin: Secondary | ICD-10-CM | POA: Diagnosis not present

## 2020-08-06 DIAGNOSIS — G4730 Sleep apnea, unspecified: Secondary | ICD-10-CM | POA: Diagnosis not present

## 2020-08-06 DIAGNOSIS — I700 Atherosclerosis of aorta: Secondary | ICD-10-CM | POA: Diagnosis not present

## 2020-08-06 DIAGNOSIS — I10 Essential (primary) hypertension: Secondary | ICD-10-CM | POA: Diagnosis not present

## 2020-08-06 DIAGNOSIS — G894 Chronic pain syndrome: Secondary | ICD-10-CM | POA: Diagnosis not present

## 2020-08-06 DIAGNOSIS — Z9181 History of falling: Secondary | ICD-10-CM | POA: Diagnosis not present

## 2020-08-06 DIAGNOSIS — Z466 Encounter for fitting and adjustment of urinary device: Secondary | ICD-10-CM | POA: Diagnosis not present

## 2020-08-06 DIAGNOSIS — Z87442 Personal history of urinary calculi: Secondary | ICD-10-CM | POA: Diagnosis not present

## 2020-08-06 DIAGNOSIS — M21371 Foot drop, right foot: Secondary | ICD-10-CM | POA: Diagnosis not present

## 2020-08-06 DIAGNOSIS — E782 Mixed hyperlipidemia: Secondary | ICD-10-CM | POA: Diagnosis not present

## 2020-08-06 DIAGNOSIS — G629 Polyneuropathy, unspecified: Secondary | ICD-10-CM | POA: Diagnosis not present

## 2020-08-06 DIAGNOSIS — F32A Depression, unspecified: Secondary | ICD-10-CM | POA: Diagnosis not present

## 2020-08-07 DIAGNOSIS — R338 Other retention of urine: Secondary | ICD-10-CM | POA: Diagnosis not present

## 2020-08-07 DIAGNOSIS — I10 Essential (primary) hypertension: Secondary | ICD-10-CM | POA: Diagnosis not present

## 2020-08-07 DIAGNOSIS — Z87442 Personal history of urinary calculi: Secondary | ICD-10-CM | POA: Diagnosis not present

## 2020-08-07 DIAGNOSIS — K5900 Constipation, unspecified: Secondary | ICD-10-CM | POA: Diagnosis not present

## 2020-08-07 DIAGNOSIS — Z79891 Long term (current) use of opiate analgesic: Secondary | ICD-10-CM | POA: Diagnosis not present

## 2020-08-07 DIAGNOSIS — M21371 Foot drop, right foot: Secondary | ICD-10-CM | POA: Diagnosis not present

## 2020-08-07 DIAGNOSIS — Z9181 History of falling: Secondary | ICD-10-CM | POA: Diagnosis not present

## 2020-08-07 DIAGNOSIS — E782 Mixed hyperlipidemia: Secondary | ICD-10-CM | POA: Diagnosis not present

## 2020-08-07 DIAGNOSIS — L89322 Pressure ulcer of left buttock, stage 2: Secondary | ICD-10-CM | POA: Diagnosis not present

## 2020-08-07 DIAGNOSIS — Z466 Encounter for fitting and adjustment of urinary device: Secondary | ICD-10-CM | POA: Diagnosis not present

## 2020-08-07 DIAGNOSIS — I700 Atherosclerosis of aorta: Secondary | ICD-10-CM | POA: Diagnosis not present

## 2020-08-07 DIAGNOSIS — R202 Paresthesia of skin: Secondary | ICD-10-CM | POA: Diagnosis not present

## 2020-08-07 DIAGNOSIS — G2581 Restless legs syndrome: Secondary | ICD-10-CM | POA: Diagnosis not present

## 2020-08-07 DIAGNOSIS — G4730 Sleep apnea, unspecified: Secondary | ICD-10-CM | POA: Diagnosis not present

## 2020-08-07 DIAGNOSIS — G894 Chronic pain syndrome: Secondary | ICD-10-CM | POA: Diagnosis not present

## 2020-08-07 DIAGNOSIS — M1991 Primary osteoarthritis, unspecified site: Secondary | ICD-10-CM | POA: Diagnosis not present

## 2020-08-07 DIAGNOSIS — F32A Depression, unspecified: Secondary | ICD-10-CM | POA: Diagnosis not present

## 2020-08-07 DIAGNOSIS — G629 Polyneuropathy, unspecified: Secondary | ICD-10-CM | POA: Diagnosis not present

## 2020-08-08 DIAGNOSIS — M1991 Primary osteoarthritis, unspecified site: Secondary | ICD-10-CM | POA: Diagnosis not present

## 2020-08-08 DIAGNOSIS — M21371 Foot drop, right foot: Secondary | ICD-10-CM | POA: Diagnosis not present

## 2020-08-08 DIAGNOSIS — I700 Atherosclerosis of aorta: Secondary | ICD-10-CM | POA: Diagnosis not present

## 2020-08-08 DIAGNOSIS — G4730 Sleep apnea, unspecified: Secondary | ICD-10-CM | POA: Diagnosis not present

## 2020-08-08 DIAGNOSIS — Z79891 Long term (current) use of opiate analgesic: Secondary | ICD-10-CM | POA: Diagnosis not present

## 2020-08-08 DIAGNOSIS — Z466 Encounter for fitting and adjustment of urinary device: Secondary | ICD-10-CM | POA: Diagnosis not present

## 2020-08-08 DIAGNOSIS — I10 Essential (primary) hypertension: Secondary | ICD-10-CM | POA: Diagnosis not present

## 2020-08-08 DIAGNOSIS — Z9181 History of falling: Secondary | ICD-10-CM | POA: Diagnosis not present

## 2020-08-08 DIAGNOSIS — K5900 Constipation, unspecified: Secondary | ICD-10-CM | POA: Diagnosis not present

## 2020-08-08 DIAGNOSIS — G629 Polyneuropathy, unspecified: Secondary | ICD-10-CM | POA: Diagnosis not present

## 2020-08-08 DIAGNOSIS — G2581 Restless legs syndrome: Secondary | ICD-10-CM | POA: Diagnosis not present

## 2020-08-08 DIAGNOSIS — G894 Chronic pain syndrome: Secondary | ICD-10-CM | POA: Diagnosis not present

## 2020-08-08 DIAGNOSIS — E782 Mixed hyperlipidemia: Secondary | ICD-10-CM | POA: Diagnosis not present

## 2020-08-08 DIAGNOSIS — Z87442 Personal history of urinary calculi: Secondary | ICD-10-CM | POA: Diagnosis not present

## 2020-08-08 DIAGNOSIS — F32A Depression, unspecified: Secondary | ICD-10-CM | POA: Diagnosis not present

## 2020-08-08 DIAGNOSIS — R202 Paresthesia of skin: Secondary | ICD-10-CM | POA: Diagnosis not present

## 2020-08-08 DIAGNOSIS — R338 Other retention of urine: Secondary | ICD-10-CM | POA: Diagnosis not present

## 2020-08-08 DIAGNOSIS — L89322 Pressure ulcer of left buttock, stage 2: Secondary | ICD-10-CM | POA: Diagnosis not present

## 2020-08-09 DIAGNOSIS — I700 Atherosclerosis of aorta: Secondary | ICD-10-CM | POA: Diagnosis not present

## 2020-08-09 DIAGNOSIS — Z87442 Personal history of urinary calculi: Secondary | ICD-10-CM | POA: Diagnosis not present

## 2020-08-09 DIAGNOSIS — G629 Polyneuropathy, unspecified: Secondary | ICD-10-CM | POA: Diagnosis not present

## 2020-08-09 DIAGNOSIS — M1991 Primary osteoarthritis, unspecified site: Secondary | ICD-10-CM | POA: Diagnosis not present

## 2020-08-09 DIAGNOSIS — G894 Chronic pain syndrome: Secondary | ICD-10-CM | POA: Diagnosis not present

## 2020-08-09 DIAGNOSIS — F32A Depression, unspecified: Secondary | ICD-10-CM | POA: Diagnosis not present

## 2020-08-09 DIAGNOSIS — R338 Other retention of urine: Secondary | ICD-10-CM | POA: Diagnosis not present

## 2020-08-09 DIAGNOSIS — Z9181 History of falling: Secondary | ICD-10-CM | POA: Diagnosis not present

## 2020-08-09 DIAGNOSIS — G2581 Restless legs syndrome: Secondary | ICD-10-CM | POA: Diagnosis not present

## 2020-08-09 DIAGNOSIS — L89322 Pressure ulcer of left buttock, stage 2: Secondary | ICD-10-CM | POA: Diagnosis not present

## 2020-08-09 DIAGNOSIS — Z79891 Long term (current) use of opiate analgesic: Secondary | ICD-10-CM | POA: Diagnosis not present

## 2020-08-09 DIAGNOSIS — G4730 Sleep apnea, unspecified: Secondary | ICD-10-CM | POA: Diagnosis not present

## 2020-08-09 DIAGNOSIS — Z466 Encounter for fitting and adjustment of urinary device: Secondary | ICD-10-CM | POA: Diagnosis not present

## 2020-08-09 DIAGNOSIS — M21371 Foot drop, right foot: Secondary | ICD-10-CM | POA: Diagnosis not present

## 2020-08-09 DIAGNOSIS — K5900 Constipation, unspecified: Secondary | ICD-10-CM | POA: Diagnosis not present

## 2020-08-09 DIAGNOSIS — E782 Mixed hyperlipidemia: Secondary | ICD-10-CM | POA: Diagnosis not present

## 2020-08-09 DIAGNOSIS — I10 Essential (primary) hypertension: Secondary | ICD-10-CM | POA: Diagnosis not present

## 2020-08-09 DIAGNOSIS — R202 Paresthesia of skin: Secondary | ICD-10-CM | POA: Diagnosis not present

## 2020-08-13 DIAGNOSIS — E782 Mixed hyperlipidemia: Secondary | ICD-10-CM | POA: Diagnosis not present

## 2020-08-13 DIAGNOSIS — M21371 Foot drop, right foot: Secondary | ICD-10-CM | POA: Diagnosis not present

## 2020-08-13 DIAGNOSIS — G2581 Restless legs syndrome: Secondary | ICD-10-CM | POA: Diagnosis not present

## 2020-08-13 DIAGNOSIS — L89322 Pressure ulcer of left buttock, stage 2: Secondary | ICD-10-CM | POA: Diagnosis not present

## 2020-08-13 DIAGNOSIS — Z9181 History of falling: Secondary | ICD-10-CM | POA: Diagnosis not present

## 2020-08-13 DIAGNOSIS — M5136 Other intervertebral disc degeneration, lumbar region: Secondary | ICD-10-CM | POA: Diagnosis not present

## 2020-08-13 DIAGNOSIS — R202 Paresthesia of skin: Secondary | ICD-10-CM | POA: Diagnosis not present

## 2020-08-13 DIAGNOSIS — I10 Essential (primary) hypertension: Secondary | ICD-10-CM | POA: Diagnosis not present

## 2020-08-13 DIAGNOSIS — Z87442 Personal history of urinary calculi: Secondary | ICD-10-CM | POA: Diagnosis not present

## 2020-08-13 DIAGNOSIS — G4730 Sleep apnea, unspecified: Secondary | ICD-10-CM | POA: Diagnosis not present

## 2020-08-13 DIAGNOSIS — G629 Polyneuropathy, unspecified: Secondary | ICD-10-CM | POA: Diagnosis not present

## 2020-08-13 DIAGNOSIS — I700 Atherosclerosis of aorta: Secondary | ICD-10-CM | POA: Diagnosis not present

## 2020-08-13 DIAGNOSIS — R338 Other retention of urine: Secondary | ICD-10-CM | POA: Diagnosis not present

## 2020-08-13 DIAGNOSIS — F32A Depression, unspecified: Secondary | ICD-10-CM | POA: Diagnosis not present

## 2020-08-13 DIAGNOSIS — G894 Chronic pain syndrome: Secondary | ICD-10-CM | POA: Diagnosis not present

## 2020-08-13 DIAGNOSIS — K5900 Constipation, unspecified: Secondary | ICD-10-CM | POA: Diagnosis not present

## 2020-08-13 DIAGNOSIS — M1991 Primary osteoarthritis, unspecified site: Secondary | ICD-10-CM | POA: Diagnosis not present

## 2020-08-13 DIAGNOSIS — Z466 Encounter for fitting and adjustment of urinary device: Secondary | ICD-10-CM | POA: Diagnosis not present

## 2020-08-13 DIAGNOSIS — Z79891 Long term (current) use of opiate analgesic: Secondary | ICD-10-CM | POA: Diagnosis not present

## 2020-08-14 DIAGNOSIS — G4730 Sleep apnea, unspecified: Secondary | ICD-10-CM | POA: Diagnosis not present

## 2020-08-14 DIAGNOSIS — R202 Paresthesia of skin: Secondary | ICD-10-CM | POA: Diagnosis not present

## 2020-08-14 DIAGNOSIS — K5900 Constipation, unspecified: Secondary | ICD-10-CM | POA: Diagnosis not present

## 2020-08-14 DIAGNOSIS — I10 Essential (primary) hypertension: Secondary | ICD-10-CM | POA: Diagnosis not present

## 2020-08-14 DIAGNOSIS — Z87442 Personal history of urinary calculi: Secondary | ICD-10-CM | POA: Diagnosis not present

## 2020-08-14 DIAGNOSIS — E782 Mixed hyperlipidemia: Secondary | ICD-10-CM | POA: Diagnosis not present

## 2020-08-14 DIAGNOSIS — Z466 Encounter for fitting and adjustment of urinary device: Secondary | ICD-10-CM | POA: Diagnosis not present

## 2020-08-14 DIAGNOSIS — Z79891 Long term (current) use of opiate analgesic: Secondary | ICD-10-CM | POA: Diagnosis not present

## 2020-08-14 DIAGNOSIS — F32A Depression, unspecified: Secondary | ICD-10-CM | POA: Diagnosis not present

## 2020-08-14 DIAGNOSIS — Z9181 History of falling: Secondary | ICD-10-CM | POA: Diagnosis not present

## 2020-08-14 DIAGNOSIS — I700 Atherosclerosis of aorta: Secondary | ICD-10-CM | POA: Diagnosis not present

## 2020-08-14 DIAGNOSIS — G894 Chronic pain syndrome: Secondary | ICD-10-CM | POA: Diagnosis not present

## 2020-08-14 DIAGNOSIS — M21371 Foot drop, right foot: Secondary | ICD-10-CM | POA: Diagnosis not present

## 2020-08-14 DIAGNOSIS — R338 Other retention of urine: Secondary | ICD-10-CM | POA: Diagnosis not present

## 2020-08-14 DIAGNOSIS — M1991 Primary osteoarthritis, unspecified site: Secondary | ICD-10-CM | POA: Diagnosis not present

## 2020-08-14 DIAGNOSIS — G629 Polyneuropathy, unspecified: Secondary | ICD-10-CM | POA: Diagnosis not present

## 2020-08-14 DIAGNOSIS — L89322 Pressure ulcer of left buttock, stage 2: Secondary | ICD-10-CM | POA: Diagnosis not present

## 2020-08-14 DIAGNOSIS — G2581 Restless legs syndrome: Secondary | ICD-10-CM | POA: Diagnosis not present

## 2020-08-15 DIAGNOSIS — R338 Other retention of urine: Secondary | ICD-10-CM | POA: Diagnosis not present

## 2020-08-15 DIAGNOSIS — M21371 Foot drop, right foot: Secondary | ICD-10-CM | POA: Diagnosis not present

## 2020-08-15 DIAGNOSIS — M1991 Primary osteoarthritis, unspecified site: Secondary | ICD-10-CM | POA: Diagnosis not present

## 2020-08-15 DIAGNOSIS — G894 Chronic pain syndrome: Secondary | ICD-10-CM | POA: Diagnosis not present

## 2020-08-15 DIAGNOSIS — K5900 Constipation, unspecified: Secondary | ICD-10-CM | POA: Diagnosis not present

## 2020-08-15 DIAGNOSIS — I10 Essential (primary) hypertension: Secondary | ICD-10-CM | POA: Diagnosis not present

## 2020-08-15 DIAGNOSIS — Z87442 Personal history of urinary calculi: Secondary | ICD-10-CM | POA: Diagnosis not present

## 2020-08-15 DIAGNOSIS — Z79891 Long term (current) use of opiate analgesic: Secondary | ICD-10-CM | POA: Diagnosis not present

## 2020-08-15 DIAGNOSIS — L89322 Pressure ulcer of left buttock, stage 2: Secondary | ICD-10-CM | POA: Diagnosis not present

## 2020-08-15 DIAGNOSIS — R202 Paresthesia of skin: Secondary | ICD-10-CM | POA: Diagnosis not present

## 2020-08-15 DIAGNOSIS — G4730 Sleep apnea, unspecified: Secondary | ICD-10-CM | POA: Diagnosis not present

## 2020-08-15 DIAGNOSIS — E782 Mixed hyperlipidemia: Secondary | ICD-10-CM | POA: Diagnosis not present

## 2020-08-15 DIAGNOSIS — I700 Atherosclerosis of aorta: Secondary | ICD-10-CM | POA: Diagnosis not present

## 2020-08-15 DIAGNOSIS — Z466 Encounter for fitting and adjustment of urinary device: Secondary | ICD-10-CM | POA: Diagnosis not present

## 2020-08-15 DIAGNOSIS — Z9181 History of falling: Secondary | ICD-10-CM | POA: Diagnosis not present

## 2020-08-15 DIAGNOSIS — G629 Polyneuropathy, unspecified: Secondary | ICD-10-CM | POA: Diagnosis not present

## 2020-08-15 DIAGNOSIS — G2581 Restless legs syndrome: Secondary | ICD-10-CM | POA: Diagnosis not present

## 2020-08-15 DIAGNOSIS — F32A Depression, unspecified: Secondary | ICD-10-CM | POA: Diagnosis not present

## 2020-08-16 DIAGNOSIS — I700 Atherosclerosis of aorta: Secondary | ICD-10-CM | POA: Diagnosis not present

## 2020-08-16 DIAGNOSIS — M21371 Foot drop, right foot: Secondary | ICD-10-CM | POA: Diagnosis not present

## 2020-08-16 DIAGNOSIS — G4730 Sleep apnea, unspecified: Secondary | ICD-10-CM | POA: Diagnosis not present

## 2020-08-16 DIAGNOSIS — F32A Depression, unspecified: Secondary | ICD-10-CM | POA: Diagnosis not present

## 2020-08-16 DIAGNOSIS — K5900 Constipation, unspecified: Secondary | ICD-10-CM | POA: Diagnosis not present

## 2020-08-16 DIAGNOSIS — Z79891 Long term (current) use of opiate analgesic: Secondary | ICD-10-CM | POA: Diagnosis not present

## 2020-08-16 DIAGNOSIS — L89322 Pressure ulcer of left buttock, stage 2: Secondary | ICD-10-CM | POA: Diagnosis not present

## 2020-08-16 DIAGNOSIS — G2581 Restless legs syndrome: Secondary | ICD-10-CM | POA: Diagnosis not present

## 2020-08-16 DIAGNOSIS — M1991 Primary osteoarthritis, unspecified site: Secondary | ICD-10-CM | POA: Diagnosis not present

## 2020-08-16 DIAGNOSIS — Z9181 History of falling: Secondary | ICD-10-CM | POA: Diagnosis not present

## 2020-08-16 DIAGNOSIS — R338 Other retention of urine: Secondary | ICD-10-CM | POA: Diagnosis not present

## 2020-08-16 DIAGNOSIS — G629 Polyneuropathy, unspecified: Secondary | ICD-10-CM | POA: Diagnosis not present

## 2020-08-16 DIAGNOSIS — R202 Paresthesia of skin: Secondary | ICD-10-CM | POA: Diagnosis not present

## 2020-08-16 DIAGNOSIS — Z87442 Personal history of urinary calculi: Secondary | ICD-10-CM | POA: Diagnosis not present

## 2020-08-16 DIAGNOSIS — G894 Chronic pain syndrome: Secondary | ICD-10-CM | POA: Diagnosis not present

## 2020-08-16 DIAGNOSIS — I10 Essential (primary) hypertension: Secondary | ICD-10-CM | POA: Diagnosis not present

## 2020-08-16 DIAGNOSIS — E782 Mixed hyperlipidemia: Secondary | ICD-10-CM | POA: Diagnosis not present

## 2020-08-16 DIAGNOSIS — Z466 Encounter for fitting and adjustment of urinary device: Secondary | ICD-10-CM | POA: Diagnosis not present

## 2020-08-18 DIAGNOSIS — G629 Polyneuropathy, unspecified: Secondary | ICD-10-CM | POA: Diagnosis not present

## 2020-08-18 DIAGNOSIS — G2581 Restless legs syndrome: Secondary | ICD-10-CM | POA: Diagnosis not present

## 2020-08-18 DIAGNOSIS — R338 Other retention of urine: Secondary | ICD-10-CM | POA: Diagnosis not present

## 2020-08-18 DIAGNOSIS — R202 Paresthesia of skin: Secondary | ICD-10-CM | POA: Diagnosis not present

## 2020-08-18 DIAGNOSIS — G894 Chronic pain syndrome: Secondary | ICD-10-CM | POA: Diagnosis not present

## 2020-08-18 DIAGNOSIS — M21371 Foot drop, right foot: Secondary | ICD-10-CM | POA: Diagnosis not present

## 2020-08-18 DIAGNOSIS — G4730 Sleep apnea, unspecified: Secondary | ICD-10-CM | POA: Diagnosis not present

## 2020-08-18 DIAGNOSIS — Z79891 Long term (current) use of opiate analgesic: Secondary | ICD-10-CM | POA: Diagnosis not present

## 2020-08-18 DIAGNOSIS — E782 Mixed hyperlipidemia: Secondary | ICD-10-CM | POA: Diagnosis not present

## 2020-08-18 DIAGNOSIS — I10 Essential (primary) hypertension: Secondary | ICD-10-CM | POA: Diagnosis not present

## 2020-08-18 DIAGNOSIS — F32A Depression, unspecified: Secondary | ICD-10-CM | POA: Diagnosis not present

## 2020-08-18 DIAGNOSIS — Z87442 Personal history of urinary calculi: Secondary | ICD-10-CM | POA: Diagnosis not present

## 2020-08-18 DIAGNOSIS — Z466 Encounter for fitting and adjustment of urinary device: Secondary | ICD-10-CM | POA: Diagnosis not present

## 2020-08-18 DIAGNOSIS — L89322 Pressure ulcer of left buttock, stage 2: Secondary | ICD-10-CM | POA: Diagnosis not present

## 2020-08-18 DIAGNOSIS — I700 Atherosclerosis of aorta: Secondary | ICD-10-CM | POA: Diagnosis not present

## 2020-08-18 DIAGNOSIS — M1991 Primary osteoarthritis, unspecified site: Secondary | ICD-10-CM | POA: Diagnosis not present

## 2020-08-18 DIAGNOSIS — Z9181 History of falling: Secondary | ICD-10-CM | POA: Diagnosis not present

## 2020-08-18 DIAGNOSIS — K5900 Constipation, unspecified: Secondary | ICD-10-CM | POA: Diagnosis not present

## 2020-08-19 DIAGNOSIS — M1991 Primary osteoarthritis, unspecified site: Secondary | ICD-10-CM | POA: Diagnosis not present

## 2020-08-19 DIAGNOSIS — M21371 Foot drop, right foot: Secondary | ICD-10-CM | POA: Diagnosis not present

## 2020-08-19 DIAGNOSIS — G4730 Sleep apnea, unspecified: Secondary | ICD-10-CM | POA: Diagnosis not present

## 2020-08-19 DIAGNOSIS — G894 Chronic pain syndrome: Secondary | ICD-10-CM | POA: Diagnosis not present

## 2020-08-19 DIAGNOSIS — R202 Paresthesia of skin: Secondary | ICD-10-CM | POA: Diagnosis not present

## 2020-08-19 DIAGNOSIS — Z79891 Long term (current) use of opiate analgesic: Secondary | ICD-10-CM | POA: Diagnosis not present

## 2020-08-19 DIAGNOSIS — F32A Depression, unspecified: Secondary | ICD-10-CM | POA: Diagnosis not present

## 2020-08-19 DIAGNOSIS — I10 Essential (primary) hypertension: Secondary | ICD-10-CM | POA: Diagnosis not present

## 2020-08-19 DIAGNOSIS — K5900 Constipation, unspecified: Secondary | ICD-10-CM | POA: Diagnosis not present

## 2020-08-19 DIAGNOSIS — G2581 Restless legs syndrome: Secondary | ICD-10-CM | POA: Diagnosis not present

## 2020-08-19 DIAGNOSIS — L89322 Pressure ulcer of left buttock, stage 2: Secondary | ICD-10-CM | POA: Diagnosis not present

## 2020-08-19 DIAGNOSIS — R338 Other retention of urine: Secondary | ICD-10-CM | POA: Diagnosis not present

## 2020-08-19 DIAGNOSIS — Z87442 Personal history of urinary calculi: Secondary | ICD-10-CM | POA: Diagnosis not present

## 2020-08-19 DIAGNOSIS — Z466 Encounter for fitting and adjustment of urinary device: Secondary | ICD-10-CM | POA: Diagnosis not present

## 2020-08-19 DIAGNOSIS — I700 Atherosclerosis of aorta: Secondary | ICD-10-CM | POA: Diagnosis not present

## 2020-08-19 DIAGNOSIS — G629 Polyneuropathy, unspecified: Secondary | ICD-10-CM | POA: Diagnosis not present

## 2020-08-19 DIAGNOSIS — Z9181 History of falling: Secondary | ICD-10-CM | POA: Diagnosis not present

## 2020-08-19 DIAGNOSIS — E782 Mixed hyperlipidemia: Secondary | ICD-10-CM | POA: Diagnosis not present

## 2020-08-20 DIAGNOSIS — M21371 Foot drop, right foot: Secondary | ICD-10-CM | POA: Diagnosis not present

## 2020-08-20 DIAGNOSIS — G2581 Restless legs syndrome: Secondary | ICD-10-CM | POA: Diagnosis not present

## 2020-08-20 DIAGNOSIS — Z9181 History of falling: Secondary | ICD-10-CM | POA: Diagnosis not present

## 2020-08-20 DIAGNOSIS — M1991 Primary osteoarthritis, unspecified site: Secondary | ICD-10-CM | POA: Diagnosis not present

## 2020-08-20 DIAGNOSIS — I10 Essential (primary) hypertension: Secondary | ICD-10-CM | POA: Diagnosis not present

## 2020-08-20 DIAGNOSIS — G894 Chronic pain syndrome: Secondary | ICD-10-CM | POA: Diagnosis not present

## 2020-08-20 DIAGNOSIS — R202 Paresthesia of skin: Secondary | ICD-10-CM | POA: Diagnosis not present

## 2020-08-20 DIAGNOSIS — Z466 Encounter for fitting and adjustment of urinary device: Secondary | ICD-10-CM | POA: Diagnosis not present

## 2020-08-20 DIAGNOSIS — E782 Mixed hyperlipidemia: Secondary | ICD-10-CM | POA: Diagnosis not present

## 2020-08-20 DIAGNOSIS — L89322 Pressure ulcer of left buttock, stage 2: Secondary | ICD-10-CM | POA: Diagnosis not present

## 2020-08-20 DIAGNOSIS — I700 Atherosclerosis of aorta: Secondary | ICD-10-CM | POA: Diagnosis not present

## 2020-08-20 DIAGNOSIS — G629 Polyneuropathy, unspecified: Secondary | ICD-10-CM | POA: Diagnosis not present

## 2020-08-20 DIAGNOSIS — R338 Other retention of urine: Secondary | ICD-10-CM | POA: Diagnosis not present

## 2020-08-20 DIAGNOSIS — G4730 Sleep apnea, unspecified: Secondary | ICD-10-CM | POA: Diagnosis not present

## 2020-08-20 DIAGNOSIS — Z79891 Long term (current) use of opiate analgesic: Secondary | ICD-10-CM | POA: Diagnosis not present

## 2020-08-20 DIAGNOSIS — Z87442 Personal history of urinary calculi: Secondary | ICD-10-CM | POA: Diagnosis not present

## 2020-08-20 DIAGNOSIS — F32A Depression, unspecified: Secondary | ICD-10-CM | POA: Diagnosis not present

## 2020-08-20 DIAGNOSIS — K5900 Constipation, unspecified: Secondary | ICD-10-CM | POA: Diagnosis not present

## 2020-08-21 DIAGNOSIS — Z79891 Long term (current) use of opiate analgesic: Secondary | ICD-10-CM | POA: Diagnosis not present

## 2020-08-21 DIAGNOSIS — I10 Essential (primary) hypertension: Secondary | ICD-10-CM | POA: Diagnosis not present

## 2020-08-21 DIAGNOSIS — G894 Chronic pain syndrome: Secondary | ICD-10-CM | POA: Diagnosis not present

## 2020-08-21 DIAGNOSIS — Z466 Encounter for fitting and adjustment of urinary device: Secondary | ICD-10-CM | POA: Diagnosis not present

## 2020-08-21 DIAGNOSIS — R202 Paresthesia of skin: Secondary | ICD-10-CM | POA: Diagnosis not present

## 2020-08-21 DIAGNOSIS — G4730 Sleep apnea, unspecified: Secondary | ICD-10-CM | POA: Diagnosis not present

## 2020-08-21 DIAGNOSIS — Z87442 Personal history of urinary calculi: Secondary | ICD-10-CM | POA: Diagnosis not present

## 2020-08-21 DIAGNOSIS — E782 Mixed hyperlipidemia: Secondary | ICD-10-CM | POA: Diagnosis not present

## 2020-08-21 DIAGNOSIS — G629 Polyneuropathy, unspecified: Secondary | ICD-10-CM | POA: Diagnosis not present

## 2020-08-21 DIAGNOSIS — M21371 Foot drop, right foot: Secondary | ICD-10-CM | POA: Diagnosis not present

## 2020-08-21 DIAGNOSIS — K5900 Constipation, unspecified: Secondary | ICD-10-CM | POA: Diagnosis not present

## 2020-08-21 DIAGNOSIS — G2581 Restless legs syndrome: Secondary | ICD-10-CM | POA: Diagnosis not present

## 2020-08-21 DIAGNOSIS — M1991 Primary osteoarthritis, unspecified site: Secondary | ICD-10-CM | POA: Diagnosis not present

## 2020-08-21 DIAGNOSIS — F32A Depression, unspecified: Secondary | ICD-10-CM | POA: Diagnosis not present

## 2020-08-21 DIAGNOSIS — Z9181 History of falling: Secondary | ICD-10-CM | POA: Diagnosis not present

## 2020-08-21 DIAGNOSIS — R338 Other retention of urine: Secondary | ICD-10-CM | POA: Diagnosis not present

## 2020-08-21 DIAGNOSIS — L89322 Pressure ulcer of left buttock, stage 2: Secondary | ICD-10-CM | POA: Diagnosis not present

## 2020-08-21 DIAGNOSIS — I700 Atherosclerosis of aorta: Secondary | ICD-10-CM | POA: Diagnosis not present

## 2020-08-23 DIAGNOSIS — G2581 Restless legs syndrome: Secondary | ICD-10-CM | POA: Diagnosis not present

## 2020-08-23 DIAGNOSIS — G4730 Sleep apnea, unspecified: Secondary | ICD-10-CM | POA: Diagnosis not present

## 2020-08-23 DIAGNOSIS — I10 Essential (primary) hypertension: Secondary | ICD-10-CM | POA: Diagnosis not present

## 2020-08-23 DIAGNOSIS — Z466 Encounter for fitting and adjustment of urinary device: Secondary | ICD-10-CM | POA: Diagnosis not present

## 2020-08-23 DIAGNOSIS — I700 Atherosclerosis of aorta: Secondary | ICD-10-CM | POA: Diagnosis not present

## 2020-08-23 DIAGNOSIS — E782 Mixed hyperlipidemia: Secondary | ICD-10-CM | POA: Diagnosis not present

## 2020-08-23 DIAGNOSIS — R338 Other retention of urine: Secondary | ICD-10-CM | POA: Diagnosis not present

## 2020-08-23 DIAGNOSIS — G629 Polyneuropathy, unspecified: Secondary | ICD-10-CM | POA: Diagnosis not present

## 2020-08-23 DIAGNOSIS — Z79891 Long term (current) use of opiate analgesic: Secondary | ICD-10-CM | POA: Diagnosis not present

## 2020-08-23 DIAGNOSIS — R202 Paresthesia of skin: Secondary | ICD-10-CM | POA: Diagnosis not present

## 2020-08-23 DIAGNOSIS — K5900 Constipation, unspecified: Secondary | ICD-10-CM | POA: Diagnosis not present

## 2020-08-23 DIAGNOSIS — F32A Depression, unspecified: Secondary | ICD-10-CM | POA: Diagnosis not present

## 2020-08-23 DIAGNOSIS — M21371 Foot drop, right foot: Secondary | ICD-10-CM | POA: Diagnosis not present

## 2020-08-23 DIAGNOSIS — Z87442 Personal history of urinary calculi: Secondary | ICD-10-CM | POA: Diagnosis not present

## 2020-08-23 DIAGNOSIS — L89322 Pressure ulcer of left buttock, stage 2: Secondary | ICD-10-CM | POA: Diagnosis not present

## 2020-08-23 DIAGNOSIS — Z9181 History of falling: Secondary | ICD-10-CM | POA: Diagnosis not present

## 2020-08-23 DIAGNOSIS — M1991 Primary osteoarthritis, unspecified site: Secondary | ICD-10-CM | POA: Diagnosis not present

## 2020-08-23 DIAGNOSIS — G894 Chronic pain syndrome: Secondary | ICD-10-CM | POA: Diagnosis not present

## 2020-08-24 DIAGNOSIS — Z87442 Personal history of urinary calculi: Secondary | ICD-10-CM | POA: Diagnosis not present

## 2020-08-24 DIAGNOSIS — K5900 Constipation, unspecified: Secondary | ICD-10-CM | POA: Diagnosis not present

## 2020-08-24 DIAGNOSIS — M1991 Primary osteoarthritis, unspecified site: Secondary | ICD-10-CM | POA: Diagnosis not present

## 2020-08-24 DIAGNOSIS — E782 Mixed hyperlipidemia: Secondary | ICD-10-CM | POA: Diagnosis not present

## 2020-08-24 DIAGNOSIS — M21371 Foot drop, right foot: Secondary | ICD-10-CM | POA: Diagnosis not present

## 2020-08-24 DIAGNOSIS — Z79891 Long term (current) use of opiate analgesic: Secondary | ICD-10-CM | POA: Diagnosis not present

## 2020-08-24 DIAGNOSIS — F32A Depression, unspecified: Secondary | ICD-10-CM | POA: Diagnosis not present

## 2020-08-24 DIAGNOSIS — G629 Polyneuropathy, unspecified: Secondary | ICD-10-CM | POA: Diagnosis not present

## 2020-08-24 DIAGNOSIS — R338 Other retention of urine: Secondary | ICD-10-CM | POA: Diagnosis not present

## 2020-08-24 DIAGNOSIS — G894 Chronic pain syndrome: Secondary | ICD-10-CM | POA: Diagnosis not present

## 2020-08-24 DIAGNOSIS — Z466 Encounter for fitting and adjustment of urinary device: Secondary | ICD-10-CM | POA: Diagnosis not present

## 2020-08-24 DIAGNOSIS — R202 Paresthesia of skin: Secondary | ICD-10-CM | POA: Diagnosis not present

## 2020-08-24 DIAGNOSIS — G2581 Restless legs syndrome: Secondary | ICD-10-CM | POA: Diagnosis not present

## 2020-08-24 DIAGNOSIS — I10 Essential (primary) hypertension: Secondary | ICD-10-CM | POA: Diagnosis not present

## 2020-08-24 DIAGNOSIS — Z9181 History of falling: Secondary | ICD-10-CM | POA: Diagnosis not present

## 2020-08-24 DIAGNOSIS — I700 Atherosclerosis of aorta: Secondary | ICD-10-CM | POA: Diagnosis not present

## 2020-08-24 DIAGNOSIS — G4730 Sleep apnea, unspecified: Secondary | ICD-10-CM | POA: Diagnosis not present

## 2020-08-24 DIAGNOSIS — L89322 Pressure ulcer of left buttock, stage 2: Secondary | ICD-10-CM | POA: Diagnosis not present

## 2020-08-26 DIAGNOSIS — T83098D Other mechanical complication of other indwelling urethral catheter, subsequent encounter: Secondary | ICD-10-CM | POA: Diagnosis not present

## 2020-08-26 DIAGNOSIS — R338 Other retention of urine: Secondary | ICD-10-CM | POA: Diagnosis not present

## 2020-08-28 DIAGNOSIS — I700 Atherosclerosis of aorta: Secondary | ICD-10-CM | POA: Diagnosis not present

## 2020-08-28 DIAGNOSIS — Z9181 History of falling: Secondary | ICD-10-CM | POA: Diagnosis not present

## 2020-08-28 DIAGNOSIS — E782 Mixed hyperlipidemia: Secondary | ICD-10-CM | POA: Diagnosis not present

## 2020-08-28 DIAGNOSIS — G2581 Restless legs syndrome: Secondary | ICD-10-CM | POA: Diagnosis not present

## 2020-08-28 DIAGNOSIS — I10 Essential (primary) hypertension: Secondary | ICD-10-CM | POA: Diagnosis not present

## 2020-08-28 DIAGNOSIS — Z79891 Long term (current) use of opiate analgesic: Secondary | ICD-10-CM | POA: Diagnosis not present

## 2020-08-28 DIAGNOSIS — M21371 Foot drop, right foot: Secondary | ICD-10-CM | POA: Diagnosis not present

## 2020-08-28 DIAGNOSIS — N390 Urinary tract infection, site not specified: Secondary | ICD-10-CM | POA: Diagnosis not present

## 2020-08-28 DIAGNOSIS — G4730 Sleep apnea, unspecified: Secondary | ICD-10-CM | POA: Diagnosis not present

## 2020-08-28 DIAGNOSIS — M1991 Primary osteoarthritis, unspecified site: Secondary | ICD-10-CM | POA: Diagnosis not present

## 2020-08-28 DIAGNOSIS — Z466 Encounter for fitting and adjustment of urinary device: Secondary | ICD-10-CM | POA: Diagnosis not present

## 2020-08-28 DIAGNOSIS — Z87442 Personal history of urinary calculi: Secondary | ICD-10-CM | POA: Diagnosis not present

## 2020-08-28 DIAGNOSIS — R338 Other retention of urine: Secondary | ICD-10-CM | POA: Diagnosis not present

## 2020-08-28 DIAGNOSIS — G894 Chronic pain syndrome: Secondary | ICD-10-CM | POA: Diagnosis not present

## 2020-08-28 DIAGNOSIS — N3281 Overactive bladder: Secondary | ICD-10-CM | POA: Diagnosis not present

## 2020-08-28 DIAGNOSIS — G629 Polyneuropathy, unspecified: Secondary | ICD-10-CM | POA: Diagnosis not present

## 2020-08-29 DIAGNOSIS — G894 Chronic pain syndrome: Secondary | ICD-10-CM | POA: Diagnosis not present

## 2020-08-29 DIAGNOSIS — G2581 Restless legs syndrome: Secondary | ICD-10-CM | POA: Diagnosis not present

## 2020-08-29 DIAGNOSIS — Z9181 History of falling: Secondary | ICD-10-CM | POA: Diagnosis not present

## 2020-08-29 DIAGNOSIS — N390 Urinary tract infection, site not specified: Secondary | ICD-10-CM | POA: Diagnosis not present

## 2020-08-29 DIAGNOSIS — Z87442 Personal history of urinary calculi: Secondary | ICD-10-CM | POA: Diagnosis not present

## 2020-08-29 DIAGNOSIS — N3281 Overactive bladder: Secondary | ICD-10-CM | POA: Diagnosis not present

## 2020-08-29 DIAGNOSIS — I700 Atherosclerosis of aorta: Secondary | ICD-10-CM | POA: Diagnosis not present

## 2020-08-29 DIAGNOSIS — Z466 Encounter for fitting and adjustment of urinary device: Secondary | ICD-10-CM | POA: Diagnosis not present

## 2020-08-29 DIAGNOSIS — I10 Essential (primary) hypertension: Secondary | ICD-10-CM | POA: Diagnosis not present

## 2020-08-29 DIAGNOSIS — R338 Other retention of urine: Secondary | ICD-10-CM | POA: Diagnosis not present

## 2020-08-29 DIAGNOSIS — M21371 Foot drop, right foot: Secondary | ICD-10-CM | POA: Diagnosis not present

## 2020-08-29 DIAGNOSIS — M1991 Primary osteoarthritis, unspecified site: Secondary | ICD-10-CM | POA: Diagnosis not present

## 2020-08-29 DIAGNOSIS — E782 Mixed hyperlipidemia: Secondary | ICD-10-CM | POA: Diagnosis not present

## 2020-08-29 DIAGNOSIS — G629 Polyneuropathy, unspecified: Secondary | ICD-10-CM | POA: Diagnosis not present

## 2020-08-29 DIAGNOSIS — G4730 Sleep apnea, unspecified: Secondary | ICD-10-CM | POA: Diagnosis not present

## 2020-08-29 DIAGNOSIS — Z79891 Long term (current) use of opiate analgesic: Secondary | ICD-10-CM | POA: Diagnosis not present

## 2020-08-30 DIAGNOSIS — Z79891 Long term (current) use of opiate analgesic: Secondary | ICD-10-CM | POA: Diagnosis not present

## 2020-08-30 DIAGNOSIS — I10 Essential (primary) hypertension: Secondary | ICD-10-CM | POA: Diagnosis not present

## 2020-08-30 DIAGNOSIS — E782 Mixed hyperlipidemia: Secondary | ICD-10-CM | POA: Diagnosis not present

## 2020-08-30 DIAGNOSIS — R0601 Orthopnea: Secondary | ICD-10-CM | POA: Diagnosis not present

## 2020-08-30 DIAGNOSIS — Z87442 Personal history of urinary calculi: Secondary | ICD-10-CM | POA: Diagnosis not present

## 2020-08-30 DIAGNOSIS — Z9181 History of falling: Secondary | ICD-10-CM | POA: Diagnosis not present

## 2020-08-30 DIAGNOSIS — N390 Urinary tract infection, site not specified: Secondary | ICD-10-CM | POA: Diagnosis not present

## 2020-08-30 DIAGNOSIS — G4730 Sleep apnea, unspecified: Secondary | ICD-10-CM | POA: Diagnosis not present

## 2020-08-30 DIAGNOSIS — G629 Polyneuropathy, unspecified: Secondary | ICD-10-CM | POA: Diagnosis not present

## 2020-08-30 DIAGNOSIS — Z466 Encounter for fitting and adjustment of urinary device: Secondary | ICD-10-CM | POA: Diagnosis not present

## 2020-08-30 DIAGNOSIS — M21371 Foot drop, right foot: Secondary | ICD-10-CM | POA: Diagnosis not present

## 2020-08-30 DIAGNOSIS — Z7409 Other reduced mobility: Secondary | ICD-10-CM | POA: Diagnosis not present

## 2020-08-30 DIAGNOSIS — M1991 Primary osteoarthritis, unspecified site: Secondary | ICD-10-CM | POA: Diagnosis not present

## 2020-08-30 DIAGNOSIS — I700 Atherosclerosis of aorta: Secondary | ICD-10-CM | POA: Diagnosis not present

## 2020-08-30 DIAGNOSIS — N3281 Overactive bladder: Secondary | ICD-10-CM | POA: Diagnosis not present

## 2020-08-30 DIAGNOSIS — G2581 Restless legs syndrome: Secondary | ICD-10-CM | POA: Diagnosis not present

## 2020-08-30 DIAGNOSIS — R338 Other retention of urine: Secondary | ICD-10-CM | POA: Diagnosis not present

## 2020-08-30 DIAGNOSIS — G894 Chronic pain syndrome: Secondary | ICD-10-CM | POA: Diagnosis not present

## 2020-09-02 ENCOUNTER — Emergency Department (HOSPITAL_COMMUNITY)
Admission: EM | Admit: 2020-09-02 | Discharge: 2020-09-02 | Disposition: A | Discharge status: Home / Self Care | Payer: Medicare Other | Attending: Emergency Medicine | Admitting: Emergency Medicine

## 2020-09-02 ENCOUNTER — Other Ambulatory Visit: Payer: Self-pay

## 2020-09-02 ENCOUNTER — Encounter (HOSPITAL_COMMUNITY): Payer: Self-pay | Admitting: *Deleted

## 2020-09-02 DIAGNOSIS — Z743 Need for continuous supervision: Secondary | ICD-10-CM | POA: Diagnosis not present

## 2020-09-02 DIAGNOSIS — I10 Essential (primary) hypertension: Secondary | ICD-10-CM | POA: Diagnosis not present

## 2020-09-02 DIAGNOSIS — T83098A Other mechanical complication of other indwelling urethral catheter, initial encounter: Secondary | ICD-10-CM | POA: Diagnosis not present

## 2020-09-02 DIAGNOSIS — T839XXA Unspecified complication of genitourinary prosthetic device, implant and graft, initial encounter: Secondary | ICD-10-CM

## 2020-09-02 DIAGNOSIS — Z79899 Other long term (current) drug therapy: Secondary | ICD-10-CM | POA: Insufficient documentation

## 2020-09-02 DIAGNOSIS — R339 Retention of urine, unspecified: Secondary | ICD-10-CM | POA: Diagnosis not present

## 2020-09-02 DIAGNOSIS — Z87891 Personal history of nicotine dependence: Secondary | ICD-10-CM | POA: Diagnosis not present

## 2020-09-02 DIAGNOSIS — N390 Urinary tract infection, site not specified: Secondary | ICD-10-CM | POA: Diagnosis not present

## 2020-09-02 DIAGNOSIS — Z466 Encounter for fitting and adjustment of urinary device: Secondary | ICD-10-CM | POA: Diagnosis not present

## 2020-09-02 DIAGNOSIS — R5381 Other malaise: Secondary | ICD-10-CM | POA: Diagnosis not present

## 2020-09-02 DIAGNOSIS — R338 Other retention of urine: Secondary | ICD-10-CM | POA: Diagnosis not present

## 2020-09-02 NOTE — ED Notes (Signed)
Patient's bladder scanned at this time. Patient had approximately 175m of fluid in bladder prior to catheter removal and reinsertion with new catheter.

## 2020-09-02 NOTE — ED Notes (Signed)
Brother tommy called for patient discharge and states he will be here in 20-30 minutes to pick up patient.

## 2020-09-02 NOTE — ED Provider Notes (Signed)
[facility] EMERGENCY DEPARTMENT Provider Note   CSN: [id number] Arrival date & time: 09/02/20  1340     History Chief Complaint  Patient presents with   Urinary Retention    Luke Foster is a 70 y.o. male with a history of acute recurrent urinary retention, hypertension, hyperlipidemia, chronic low back pain.  Patient presents to the emergency department with a chief complaint of leaking urinary catheter.  Patient reports that he has had a catheter for the last few months due to nerve damage precluding him from ambulating effectively.  Patient states that over the last 2 days he has had leaking from catheter at meatus.   Patient denies any fevers, chills, dysuria, genital swelling, general tenderness, hematuria, urinary frequency, abdominal pain, nausea, vomiting.  Patient states that his urologist name is Dr. Gloriann Loan.  HPI     Past Medical History:  Diagnosis Date   Arthritis    Chronic back pain    Chronic pain 1999   Bilateral feet (R >L)   Difficult or painful urination    High blood pressure    History of kidney stones    H/O   Nerve damage    Pain management    Paresthesia of foot, bilateral    Renal disorder    Sleep apnea    USES CPAP   Weakness of both legs     Patient Active Problem List   Diagnosis Date Noted   Generalized weakness 04/30/2020   Constipation 04/30/2020   Nephrolithiasis 04/30/2020   Pressure injury of skin 04/30/2020   Acute urinary retention 04/29/2020   Pain in limb [id number]   Acute metabolic encephalopathy [id number]   Bilateral hydronephrosis 03/07/2019   Altered mental status 03/07/2019   Hypertension    Abnormal ECG 02/28/2019   Atherosclerosis of abdominal aorta ([place]) 02/28/2019   Benign essential HTN 02/28/2019   Hyperlipidemia, mixed 02/28/2019   Neuropathy 01/21/2017   Pain in right foot 01/21/2017   Pain in left foot 01/21/2017   Chronic pain syndrome 01/21/2017   Post laminectomy syndrome 01/21/2017   Status post  insertion of spinal cord stimulator [id number]   Uncomplicated opioid dependence ([place]) 01/21/2017   Chronic bilateral low back pain without sciatica 11/27/2015   Degenerative disc disease, lumbar 06/17/2015   Encounter for therapeutic drug monitoring 06/17/2015   Nervous system device, implant, or graft complication, initial encounter 04/06/2014   Lumbosacral radiculopathy 12/09/2012    Past Surgical History:  Procedure Laterality Date   arm surgery     BACK SURGERY     X2   FACIAL COSMETIC SURGERY     HERNIA REPAIR     IR US GUIDE BX ASP/DRAIN  10/19/2017   KIDNEY STONE SURGERY     LUMBAR SPINAL CORD SIMULATOR LEAD REMOVAL N/A 03/01/2019   Procedure: LUMBAR SPINAL CORD SIMULATOR LEAD REMOVAL;  Surgeon: Meade Maw, MD;  Location: [facility] ORS;  Service: Neurosurgery;  Laterality: N/A;   MASTECTOMY Left    SPINAL CORD STIMULATOR REMOVAL N/A 03/01/2019   Procedure: LUMBAR SPINAL CORD STIMULATOR REMOVAL;  Surgeon: Meade Maw, MD;  Location: [facility] ORS;  Service: Neurosurgery;  Laterality: N/A;       Family History  Problem Relation Age of Onset   Heart disease Father        Living, 57   Breast cancer Mother        Living, 21   Hypercholesterolemia Mother    Healthy Brother    Healthy Sister     Social History  Tobacco Use   Smoking status: Former    Packs/day: 2.00    Years: 10.00    Pack years: 20.00    Types: Cigarettes    Quit date: 05/21/1991    Years since quitting: 29.3   Smokeless tobacco: Former    Quit date: 06/08/1991  Vaping Use   Vaping Use: Never used  Substance Use Topics   Alcohol use: No   Drug use: No    Home Medications Prior to Admission medications   Medication Sig Start Date End Date Taking? Authorizing Provider  ARIPiprazole (ABILIFY) 5 MG tablet Take 1 tablet (5 mg total) by mouth daily. 05/17/20   Medina-Vargas, Monina C, NP  cholecalciferol (VITAMIN D3) 25 MCG (1000 UNIT) tablet Take 1 tablet (1,000 Units total) by mouth daily.  05/17/20   Medina-Vargas, Monina C, NP  diltiazem (CARDIZEM CD) 120 MG 24 hr capsule Take 1 capsule (120 mg total) by mouth every evening. 05/17/20   Medina-Vargas, Monina C, NP  docusate sodium (COLACE) 100 MG capsule Take 1 capsule (100 mg total) by mouth in the morning, at noon, and at bedtime. 05/17/20   Medina-Vargas, Monina C, NP  finasteride (PROSCAR) 5 MG tablet Take 1 tablet (5 mg total) by mouth daily. 05/17/20   Medina-Vargas, Monina C, NP  mirabegron ER (MYRBETRIQ) 50 MG TB24 tablet Take 1 tablet (50 mg total) by mouth daily. 05/17/20   Medina-Vargas, Monina C, NP  oxyCODONE (ROXICODONE) 15 MG immediate release tablet Take 1 tablet (15 mg total) by mouth every 6 (six) hours as needed for pain. 05/17/20   Medina-Vargas, Monina C, NP  polyethylene glycol (MIRALAX / GLYCOLAX) 17 g packet Take 17 g by mouth 2 (two) times daily. 05/17/20   Medina-Vargas, Monina C, NP  senna-docusate (SENOKOT-S) 8.6-50 MG tablet Take 1 tablet by mouth 2 (two) times daily. 05/02/20   Georgette Shell, MD  tamsulosin (FLOMAX) 0.4 MG CAPS capsule Take 1 capsule (0.4 mg total) by mouth every evening. 05/17/20   Medina-Vargas, Monina C, NP  tolterodine (DETROL) 1 MG tablet Take 1 tablet (1 mg total) by mouth 2 (two) times daily. 05/17/20   Medina-Vargas, Monina C, NP  traZODone (DESYREL) 50 MG tablet Take 1 tablet (50 mg total) by mouth at bedtime. 05/17/20   Medina-Vargas, Monina C, NP  venlafaxine XR (EFFEXOR-XR) 75 MG 24 hr capsule Take 1 capsule (75 mg total) by mouth in the morning and at bedtime. 05/17/20   Medina-Vargas, Monina C, NP  vitamin B-12 (CYANOCOBALAMIN) 500 MCG tablet Take 1 tablet (500 mcg total) by mouth daily. 05/17/20   Medina-Vargas, Monina C, NP    Allergies    Erythromycin  Review of Systems   Review of Systems  Constitutional:  Negative for chills and fever.  Eyes:  Negative for visual disturbance.  Gastrointestinal:  Negative for abdominal pain, nausea and vomiting.  Genitourinary:  Negative for  difficulty urinating, dysuria, flank pain, frequency, hematuria, penile discharge, penile pain, penile swelling, scrotal swelling and testicular pain.  Skin:  Negative for color change, pallor, rash and wound.  Psychiatric/Behavioral:  Negative for confusion.    Physical Exam Updated Vital Signs BP 107/73   Pulse (!) 105   Temp 99.1 F (37.3 C) (Oral)   Resp 18   SpO2 100%   Physical Exam Vitals and nursing note reviewed. Exam conducted with a chaperone present (Male RN present as chaperone).  Constitutional:      General: He is not in acute distress.    Appearance:  He is not ill-appearing, toxic-appearing or diaphoretic.  HENT:     Head: Normocephalic.  Eyes:     General: No scleral icterus.       Right eye: No discharge.        Left eye: No discharge.  Cardiovascular:     Rate and Rhythm: Normal rate.  Pulmonary:     Effort: Pulmonary effort is normal.  Abdominal:     General: There is no distension. There are no signs of injury.     Palpations: Abdomen is soft. There is no mass or pulsatile mass.     Tenderness: There is no abdominal tenderness. There is no guarding or rebound.     Hernia: There is no hernia in the umbilical area or ventral area.  Genitourinary:    Penis: Uncircumcised. No phimosis, paraphimosis, hypospadias, erythema, tenderness, discharge, swelling or lesions.      Testes: Normal. Cremasteric reflex is present.        Right: Mass, tenderness, swelling, testicular hydrocele or varicocele not present. Right testis is descended. Cremasteric reflex is present.         Left: Mass, tenderness, swelling, testicular hydrocele or varicocele not present. Left testis is descended. Cremasteric reflex is present.      Epididymis:     Right: Not inflamed or enlarged. No mass or tenderness.     Left: Not inflamed or enlarged. No mass or tenderness.     Tanner stage (genital): 5.     Comments: Urinary catheter in place, urinary meatus appears slightly  widened Skin:    General: Skin is warm and dry.  Neurological:     General: No focal deficit present.     Mental Status: He is alert.  Psychiatric:        Behavior: Behavior is cooperative.    ED Results / Procedures / Treatments   Labs (all labs ordered are listed, but only abnormal results are displayed) Labs Reviewed - No data to display  EKG None  Radiology No results found.  Procedures Procedures   Medications Ordered in ED Medications - No data to display  ED Course  I have reviewed the triage vital signs and the nursing notes.  Pertinent labs & imaging results that were available during my care of the patient were reviewed by me and considered in my medical decision making (see chart for details).    MDM Rules/Calculators/A&P                           Alert 70 year old male no acute stress, nontoxic-appearing.  Patient presents  with a chief complaint of leaking urinary catheter.  Catheter has been leaking at meatus for 2 days.  Will obtain bladder scan to evaluate for urinary retention.  GU exam shows urinary catheter in place, urinary meatus appears slightly widened.  Patient retracted and replaced his own foreskin without complication.  Current catheter size 16 [place], will replace with larger catheter size.    Bladder scan shows 114 mL.  Urinary catheter changed without complication.  Urine noted and collection bag after catheter was changed.  Patient will follow-up with urologist Dr. Gloriann Loan.  Patient given strict return precautions.  Patient expressed understanding of all instructions and is agreeable with plan.   Final Clinical Impression(s) / ED Diagnoses Final diagnoses:  Complication of Foley catheter, initial encounter [facility])    Rx / DC Orders ED Discharge Orders     None  Dyann Ruddle 09/02/20 2142    Hayden Rasmussen, MD 09/03/20 [phone]

## 2020-09-02 NOTE — Discharge Instructions (Addendum)
You came to the emergency department today due to a problem with your Foley catheter.  Your Foley catheter was replaced.  Please follow-up with your urologist if you continue to have issues with your catheter.  Get help right away if: You have redness, swelling, or pain where the catheter enters your body. You have fluid, blood, pus, or a bad smell coming from the area where the catheter enters your body. The area where the catheter enters your body feels warm to the touch. You have a fever. You have pain in your abdomen, legs, lower back, or bladder. You see blood in the catheter. Your urine is pink or red. You have nausea, vomiting, or chills. Your urine is not draining into the bag. Your catheter gets pulled out.

## 2020-09-02 NOTE — ED Triage Notes (Signed)
Patient states his catheter is leaking, placed several weeks ago

## 2020-09-03 DIAGNOSIS — I10 Essential (primary) hypertension: Secondary | ICD-10-CM | POA: Diagnosis not present

## 2020-09-03 DIAGNOSIS — N3281 Overactive bladder: Secondary | ICD-10-CM | POA: Diagnosis not present

## 2020-09-03 DIAGNOSIS — M21371 Foot drop, right foot: Secondary | ICD-10-CM | POA: Diagnosis not present

## 2020-09-03 DIAGNOSIS — M1991 Primary osteoarthritis, unspecified site: Secondary | ICD-10-CM | POA: Diagnosis not present

## 2020-09-03 DIAGNOSIS — Z87442 Personal history of urinary calculi: Secondary | ICD-10-CM | POA: Diagnosis not present

## 2020-09-03 DIAGNOSIS — R338 Other retention of urine: Secondary | ICD-10-CM | POA: Diagnosis not present

## 2020-09-03 DIAGNOSIS — G2581 Restless legs syndrome: Secondary | ICD-10-CM | POA: Diagnosis not present

## 2020-09-03 DIAGNOSIS — E782 Mixed hyperlipidemia: Secondary | ICD-10-CM | POA: Diagnosis not present

## 2020-09-03 DIAGNOSIS — Z79891 Long term (current) use of opiate analgesic: Secondary | ICD-10-CM | POA: Diagnosis not present

## 2020-09-03 DIAGNOSIS — G4730 Sleep apnea, unspecified: Secondary | ICD-10-CM | POA: Diagnosis not present

## 2020-09-03 DIAGNOSIS — Z9181 History of falling: Secondary | ICD-10-CM | POA: Diagnosis not present

## 2020-09-03 DIAGNOSIS — N390 Urinary tract infection, site not specified: Secondary | ICD-10-CM | POA: Diagnosis not present

## 2020-09-03 DIAGNOSIS — Z466 Encounter for fitting and adjustment of urinary device: Secondary | ICD-10-CM | POA: Diagnosis not present

## 2020-09-03 DIAGNOSIS — G629 Polyneuropathy, unspecified: Secondary | ICD-10-CM | POA: Diagnosis not present

## 2020-09-03 DIAGNOSIS — I700 Atherosclerosis of aorta: Secondary | ICD-10-CM | POA: Diagnosis not present

## 2020-09-03 DIAGNOSIS — G894 Chronic pain syndrome: Secondary | ICD-10-CM | POA: Diagnosis not present

## 2020-09-04 DIAGNOSIS — Z79891 Long term (current) use of opiate analgesic: Secondary | ICD-10-CM | POA: Diagnosis not present

## 2020-09-04 DIAGNOSIS — G894 Chronic pain syndrome: Secondary | ICD-10-CM | POA: Diagnosis not present

## 2020-09-04 DIAGNOSIS — G629 Polyneuropathy, unspecified: Secondary | ICD-10-CM | POA: Diagnosis not present

## 2020-09-04 DIAGNOSIS — M21371 Foot drop, right foot: Secondary | ICD-10-CM | POA: Diagnosis not present

## 2020-09-04 DIAGNOSIS — E782 Mixed hyperlipidemia: Secondary | ICD-10-CM | POA: Diagnosis not present

## 2020-09-04 DIAGNOSIS — Z466 Encounter for fitting and adjustment of urinary device: Secondary | ICD-10-CM | POA: Diagnosis not present

## 2020-09-04 DIAGNOSIS — I10 Essential (primary) hypertension: Secondary | ICD-10-CM | POA: Diagnosis not present

## 2020-09-04 DIAGNOSIS — N390 Urinary tract infection, site not specified: Secondary | ICD-10-CM | POA: Diagnosis not present

## 2020-09-04 DIAGNOSIS — R338 Other retention of urine: Secondary | ICD-10-CM | POA: Diagnosis not present

## 2020-09-04 DIAGNOSIS — Z87442 Personal history of urinary calculi: Secondary | ICD-10-CM | POA: Diagnosis not present

## 2020-09-04 DIAGNOSIS — Z9181 History of falling: Secondary | ICD-10-CM | POA: Diagnosis not present

## 2020-09-04 DIAGNOSIS — I700 Atherosclerosis of aorta: Secondary | ICD-10-CM | POA: Diagnosis not present

## 2020-09-04 DIAGNOSIS — G4730 Sleep apnea, unspecified: Secondary | ICD-10-CM | POA: Diagnosis not present

## 2020-09-04 DIAGNOSIS — N3281 Overactive bladder: Secondary | ICD-10-CM | POA: Diagnosis not present

## 2020-09-04 DIAGNOSIS — G2581 Restless legs syndrome: Secondary | ICD-10-CM | POA: Diagnosis not present

## 2020-09-04 DIAGNOSIS — M1991 Primary osteoarthritis, unspecified site: Secondary | ICD-10-CM | POA: Diagnosis not present

## 2020-09-05 DIAGNOSIS — G4730 Sleep apnea, unspecified: Secondary | ICD-10-CM | POA: Diagnosis not present

## 2020-09-05 DIAGNOSIS — Z87442 Personal history of urinary calculi: Secondary | ICD-10-CM | POA: Diagnosis not present

## 2020-09-05 DIAGNOSIS — M21371 Foot drop, right foot: Secondary | ICD-10-CM | POA: Diagnosis not present

## 2020-09-05 DIAGNOSIS — G629 Polyneuropathy, unspecified: Secondary | ICD-10-CM | POA: Diagnosis not present

## 2020-09-05 DIAGNOSIS — N3281 Overactive bladder: Secondary | ICD-10-CM | POA: Diagnosis not present

## 2020-09-05 DIAGNOSIS — R338 Other retention of urine: Secondary | ICD-10-CM | POA: Diagnosis not present

## 2020-09-05 DIAGNOSIS — G894 Chronic pain syndrome: Secondary | ICD-10-CM | POA: Diagnosis not present

## 2020-09-05 DIAGNOSIS — I10 Essential (primary) hypertension: Secondary | ICD-10-CM | POA: Diagnosis not present

## 2020-09-05 DIAGNOSIS — Z79891 Long term (current) use of opiate analgesic: Secondary | ICD-10-CM | POA: Diagnosis not present

## 2020-09-05 DIAGNOSIS — M1991 Primary osteoarthritis, unspecified site: Secondary | ICD-10-CM | POA: Diagnosis not present

## 2020-09-05 DIAGNOSIS — N390 Urinary tract infection, site not specified: Secondary | ICD-10-CM | POA: Diagnosis not present

## 2020-09-05 DIAGNOSIS — Z9181 History of falling: Secondary | ICD-10-CM | POA: Diagnosis not present

## 2020-09-05 DIAGNOSIS — I700 Atherosclerosis of aorta: Secondary | ICD-10-CM | POA: Diagnosis not present

## 2020-09-05 DIAGNOSIS — G2581 Restless legs syndrome: Secondary | ICD-10-CM | POA: Diagnosis not present

## 2020-09-05 DIAGNOSIS — Z466 Encounter for fitting and adjustment of urinary device: Secondary | ICD-10-CM | POA: Diagnosis not present

## 2020-09-05 DIAGNOSIS — E782 Mixed hyperlipidemia: Secondary | ICD-10-CM | POA: Diagnosis not present

## 2020-09-06 DIAGNOSIS — I10 Essential (primary) hypertension: Secondary | ICD-10-CM | POA: Diagnosis not present

## 2020-09-06 DIAGNOSIS — G894 Chronic pain syndrome: Secondary | ICD-10-CM | POA: Diagnosis not present

## 2020-09-06 DIAGNOSIS — N390 Urinary tract infection, site not specified: Secondary | ICD-10-CM | POA: Diagnosis not present

## 2020-09-06 DIAGNOSIS — E782 Mixed hyperlipidemia: Secondary | ICD-10-CM | POA: Diagnosis not present

## 2020-09-06 DIAGNOSIS — Z79891 Long term (current) use of opiate analgesic: Secondary | ICD-10-CM | POA: Diagnosis not present

## 2020-09-06 DIAGNOSIS — I700 Atherosclerosis of aorta: Secondary | ICD-10-CM | POA: Diagnosis not present

## 2020-09-06 DIAGNOSIS — G629 Polyneuropathy, unspecified: Secondary | ICD-10-CM | POA: Diagnosis not present

## 2020-09-06 DIAGNOSIS — G2581 Restless legs syndrome: Secondary | ICD-10-CM | POA: Diagnosis not present

## 2020-09-06 DIAGNOSIS — Z9181 History of falling: Secondary | ICD-10-CM | POA: Diagnosis not present

## 2020-09-06 DIAGNOSIS — Z87442 Personal history of urinary calculi: Secondary | ICD-10-CM | POA: Diagnosis not present

## 2020-09-06 DIAGNOSIS — R338 Other retention of urine: Secondary | ICD-10-CM | POA: Diagnosis not present

## 2020-09-06 DIAGNOSIS — G4730 Sleep apnea, unspecified: Secondary | ICD-10-CM | POA: Diagnosis not present

## 2020-09-06 DIAGNOSIS — M21371 Foot drop, right foot: Secondary | ICD-10-CM | POA: Diagnosis not present

## 2020-09-06 DIAGNOSIS — M1991 Primary osteoarthritis, unspecified site: Secondary | ICD-10-CM | POA: Diagnosis not present

## 2020-09-06 DIAGNOSIS — Z466 Encounter for fitting and adjustment of urinary device: Secondary | ICD-10-CM | POA: Diagnosis not present

## 2020-09-06 DIAGNOSIS — N3281 Overactive bladder: Secondary | ICD-10-CM | POA: Diagnosis not present

## 2020-09-09 DIAGNOSIS — N3281 Overactive bladder: Secondary | ICD-10-CM | POA: Diagnosis not present

## 2020-09-09 DIAGNOSIS — M1991 Primary osteoarthritis, unspecified site: Secondary | ICD-10-CM | POA: Diagnosis not present

## 2020-09-09 DIAGNOSIS — G2581 Restless legs syndrome: Secondary | ICD-10-CM | POA: Diagnosis not present

## 2020-09-09 DIAGNOSIS — N390 Urinary tract infection, site not specified: Secondary | ICD-10-CM | POA: Diagnosis not present

## 2020-09-09 DIAGNOSIS — G4730 Sleep apnea, unspecified: Secondary | ICD-10-CM | POA: Diagnosis not present

## 2020-09-09 DIAGNOSIS — G894 Chronic pain syndrome: Secondary | ICD-10-CM | POA: Diagnosis not present

## 2020-09-09 DIAGNOSIS — M21371 Foot drop, right foot: Secondary | ICD-10-CM | POA: Diagnosis not present

## 2020-09-09 DIAGNOSIS — Z87442 Personal history of urinary calculi: Secondary | ICD-10-CM | POA: Diagnosis not present

## 2020-09-09 DIAGNOSIS — G629 Polyneuropathy, unspecified: Secondary | ICD-10-CM | POA: Diagnosis not present

## 2020-09-09 DIAGNOSIS — I10 Essential (primary) hypertension: Secondary | ICD-10-CM | POA: Diagnosis not present

## 2020-09-09 DIAGNOSIS — Z9181 History of falling: Secondary | ICD-10-CM | POA: Diagnosis not present

## 2020-09-09 DIAGNOSIS — Z466 Encounter for fitting and adjustment of urinary device: Secondary | ICD-10-CM | POA: Diagnosis not present

## 2020-09-09 DIAGNOSIS — Z79891 Long term (current) use of opiate analgesic: Secondary | ICD-10-CM | POA: Diagnosis not present

## 2020-09-09 DIAGNOSIS — R338 Other retention of urine: Secondary | ICD-10-CM | POA: Diagnosis not present

## 2020-09-09 DIAGNOSIS — E782 Mixed hyperlipidemia: Secondary | ICD-10-CM | POA: Diagnosis not present

## 2020-09-09 DIAGNOSIS — I700 Atherosclerosis of aorta: Secondary | ICD-10-CM | POA: Diagnosis not present

## 2020-09-11 DIAGNOSIS — I700 Atherosclerosis of aorta: Secondary | ICD-10-CM | POA: Diagnosis not present

## 2020-09-11 DIAGNOSIS — N3281 Overactive bladder: Secondary | ICD-10-CM | POA: Diagnosis not present

## 2020-09-11 DIAGNOSIS — G629 Polyneuropathy, unspecified: Secondary | ICD-10-CM | POA: Diagnosis not present

## 2020-09-11 DIAGNOSIS — I10 Essential (primary) hypertension: Secondary | ICD-10-CM | POA: Diagnosis not present

## 2020-09-11 DIAGNOSIS — N390 Urinary tract infection, site not specified: Secondary | ICD-10-CM | POA: Diagnosis not present

## 2020-09-11 DIAGNOSIS — G4730 Sleep apnea, unspecified: Secondary | ICD-10-CM | POA: Diagnosis not present

## 2020-09-11 DIAGNOSIS — Z9181 History of falling: Secondary | ICD-10-CM | POA: Diagnosis not present

## 2020-09-11 DIAGNOSIS — R338 Other retention of urine: Secondary | ICD-10-CM | POA: Diagnosis not present

## 2020-09-11 DIAGNOSIS — Z79891 Long term (current) use of opiate analgesic: Secondary | ICD-10-CM | POA: Diagnosis not present

## 2020-09-11 DIAGNOSIS — G894 Chronic pain syndrome: Secondary | ICD-10-CM | POA: Diagnosis not present

## 2020-09-11 DIAGNOSIS — M1991 Primary osteoarthritis, unspecified site: Secondary | ICD-10-CM | POA: Diagnosis not present

## 2020-09-11 DIAGNOSIS — Z466 Encounter for fitting and adjustment of urinary device: Secondary | ICD-10-CM | POA: Diagnosis not present

## 2020-09-11 DIAGNOSIS — Z87442 Personal history of urinary calculi: Secondary | ICD-10-CM | POA: Diagnosis not present

## 2020-09-11 DIAGNOSIS — E782 Mixed hyperlipidemia: Secondary | ICD-10-CM | POA: Diagnosis not present

## 2020-09-11 DIAGNOSIS — G2581 Restless legs syndrome: Secondary | ICD-10-CM | POA: Diagnosis not present

## 2020-09-11 DIAGNOSIS — M21371 Foot drop, right foot: Secondary | ICD-10-CM | POA: Diagnosis not present

## 2020-09-12 DIAGNOSIS — Z9181 History of falling: Secondary | ICD-10-CM | POA: Diagnosis not present

## 2020-09-12 DIAGNOSIS — M21371 Foot drop, right foot: Secondary | ICD-10-CM | POA: Diagnosis not present

## 2020-09-12 DIAGNOSIS — G4730 Sleep apnea, unspecified: Secondary | ICD-10-CM | POA: Diagnosis not present

## 2020-09-12 DIAGNOSIS — N3281 Overactive bladder: Secondary | ICD-10-CM | POA: Diagnosis not present

## 2020-09-12 DIAGNOSIS — Z466 Encounter for fitting and adjustment of urinary device: Secondary | ICD-10-CM | POA: Diagnosis not present

## 2020-09-12 DIAGNOSIS — R338 Other retention of urine: Secondary | ICD-10-CM | POA: Diagnosis not present

## 2020-09-12 DIAGNOSIS — G894 Chronic pain syndrome: Secondary | ICD-10-CM | POA: Diagnosis not present

## 2020-09-12 DIAGNOSIS — Z79891 Long term (current) use of opiate analgesic: Secondary | ICD-10-CM | POA: Diagnosis not present

## 2020-09-12 DIAGNOSIS — I10 Essential (primary) hypertension: Secondary | ICD-10-CM | POA: Diagnosis not present

## 2020-09-12 DIAGNOSIS — G629 Polyneuropathy, unspecified: Secondary | ICD-10-CM | POA: Diagnosis not present

## 2020-09-12 DIAGNOSIS — M1991 Primary osteoarthritis, unspecified site: Secondary | ICD-10-CM | POA: Diagnosis not present

## 2020-09-12 DIAGNOSIS — Z87442 Personal history of urinary calculi: Secondary | ICD-10-CM | POA: Diagnosis not present

## 2020-09-12 DIAGNOSIS — I700 Atherosclerosis of aorta: Secondary | ICD-10-CM | POA: Diagnosis not present

## 2020-09-12 DIAGNOSIS — E782 Mixed hyperlipidemia: Secondary | ICD-10-CM | POA: Diagnosis not present

## 2020-09-12 DIAGNOSIS — G2581 Restless legs syndrome: Secondary | ICD-10-CM | POA: Diagnosis not present

## 2020-09-12 DIAGNOSIS — N390 Urinary tract infection, site not specified: Secondary | ICD-10-CM | POA: Diagnosis not present

## 2020-09-13 DIAGNOSIS — R338 Other retention of urine: Secondary | ICD-10-CM | POA: Diagnosis not present

## 2020-09-13 DIAGNOSIS — Z9181 History of falling: Secondary | ICD-10-CM | POA: Diagnosis not present

## 2020-09-13 DIAGNOSIS — Z466 Encounter for fitting and adjustment of urinary device: Secondary | ICD-10-CM | POA: Diagnosis not present

## 2020-09-13 DIAGNOSIS — Z79891 Long term (current) use of opiate analgesic: Secondary | ICD-10-CM | POA: Diagnosis not present

## 2020-09-13 DIAGNOSIS — G629 Polyneuropathy, unspecified: Secondary | ICD-10-CM | POA: Diagnosis not present

## 2020-09-13 DIAGNOSIS — M21371 Foot drop, right foot: Secondary | ICD-10-CM | POA: Diagnosis not present

## 2020-09-13 DIAGNOSIS — N390 Urinary tract infection, site not specified: Secondary | ICD-10-CM | POA: Diagnosis not present

## 2020-09-13 DIAGNOSIS — G4730 Sleep apnea, unspecified: Secondary | ICD-10-CM | POA: Diagnosis not present

## 2020-09-13 DIAGNOSIS — M1991 Primary osteoarthritis, unspecified site: Secondary | ICD-10-CM | POA: Diagnosis not present

## 2020-09-13 DIAGNOSIS — Z87442 Personal history of urinary calculi: Secondary | ICD-10-CM | POA: Diagnosis not present

## 2020-09-13 DIAGNOSIS — E782 Mixed hyperlipidemia: Secondary | ICD-10-CM | POA: Diagnosis not present

## 2020-09-13 DIAGNOSIS — I10 Essential (primary) hypertension: Secondary | ICD-10-CM | POA: Diagnosis not present

## 2020-09-13 DIAGNOSIS — G2581 Restless legs syndrome: Secondary | ICD-10-CM | POA: Diagnosis not present

## 2020-09-13 DIAGNOSIS — G894 Chronic pain syndrome: Secondary | ICD-10-CM | POA: Diagnosis not present

## 2020-09-13 DIAGNOSIS — I700 Atherosclerosis of aorta: Secondary | ICD-10-CM | POA: Diagnosis not present

## 2020-09-13 DIAGNOSIS — N3281 Overactive bladder: Secondary | ICD-10-CM | POA: Diagnosis not present

## 2020-09-16 DIAGNOSIS — Z466 Encounter for fitting and adjustment of urinary device: Secondary | ICD-10-CM | POA: Diagnosis not present

## 2020-09-16 DIAGNOSIS — M1991 Primary osteoarthritis, unspecified site: Secondary | ICD-10-CM | POA: Diagnosis not present

## 2020-09-16 DIAGNOSIS — Z79891 Long term (current) use of opiate analgesic: Secondary | ICD-10-CM | POA: Diagnosis not present

## 2020-09-16 DIAGNOSIS — R338 Other retention of urine: Secondary | ICD-10-CM | POA: Diagnosis not present

## 2020-09-16 DIAGNOSIS — M21371 Foot drop, right foot: Secondary | ICD-10-CM | POA: Diagnosis not present

## 2020-09-16 DIAGNOSIS — G894 Chronic pain syndrome: Secondary | ICD-10-CM | POA: Diagnosis not present

## 2020-09-16 DIAGNOSIS — G4730 Sleep apnea, unspecified: Secondary | ICD-10-CM | POA: Diagnosis not present

## 2020-09-16 DIAGNOSIS — N390 Urinary tract infection, site not specified: Secondary | ICD-10-CM | POA: Diagnosis not present

## 2020-09-16 DIAGNOSIS — G2581 Restless legs syndrome: Secondary | ICD-10-CM | POA: Diagnosis not present

## 2020-09-16 DIAGNOSIS — Z9181 History of falling: Secondary | ICD-10-CM | POA: Diagnosis not present

## 2020-09-16 DIAGNOSIS — I10 Essential (primary) hypertension: Secondary | ICD-10-CM | POA: Diagnosis not present

## 2020-09-16 DIAGNOSIS — G629 Polyneuropathy, unspecified: Secondary | ICD-10-CM | POA: Diagnosis not present

## 2020-09-16 DIAGNOSIS — I700 Atherosclerosis of aorta: Secondary | ICD-10-CM | POA: Diagnosis not present

## 2020-09-16 DIAGNOSIS — Z87442 Personal history of urinary calculi: Secondary | ICD-10-CM | POA: Diagnosis not present

## 2020-09-16 DIAGNOSIS — E782 Mixed hyperlipidemia: Secondary | ICD-10-CM | POA: Diagnosis not present

## 2020-09-16 DIAGNOSIS — N3281 Overactive bladder: Secondary | ICD-10-CM | POA: Diagnosis not present

## 2020-09-18 DIAGNOSIS — Z79891 Long term (current) use of opiate analgesic: Secondary | ICD-10-CM | POA: Diagnosis not present

## 2020-09-18 DIAGNOSIS — Z466 Encounter for fitting and adjustment of urinary device: Secondary | ICD-10-CM | POA: Diagnosis not present

## 2020-09-18 DIAGNOSIS — I700 Atherosclerosis of aorta: Secondary | ICD-10-CM | POA: Diagnosis not present

## 2020-09-18 DIAGNOSIS — Z87442 Personal history of urinary calculi: Secondary | ICD-10-CM | POA: Diagnosis not present

## 2020-09-18 DIAGNOSIS — G2581 Restless legs syndrome: Secondary | ICD-10-CM | POA: Diagnosis not present

## 2020-09-18 DIAGNOSIS — R338 Other retention of urine: Secondary | ICD-10-CM | POA: Diagnosis not present

## 2020-09-18 DIAGNOSIS — Z9181 History of falling: Secondary | ICD-10-CM | POA: Diagnosis not present

## 2020-09-18 DIAGNOSIS — G629 Polyneuropathy, unspecified: Secondary | ICD-10-CM | POA: Diagnosis not present

## 2020-09-18 DIAGNOSIS — E782 Mixed hyperlipidemia: Secondary | ICD-10-CM | POA: Diagnosis not present

## 2020-09-18 DIAGNOSIS — N390 Urinary tract infection, site not specified: Secondary | ICD-10-CM | POA: Diagnosis not present

## 2020-09-18 DIAGNOSIS — G4730 Sleep apnea, unspecified: Secondary | ICD-10-CM | POA: Diagnosis not present

## 2020-09-18 DIAGNOSIS — M1991 Primary osteoarthritis, unspecified site: Secondary | ICD-10-CM | POA: Diagnosis not present

## 2020-09-18 DIAGNOSIS — I10 Essential (primary) hypertension: Secondary | ICD-10-CM | POA: Diagnosis not present

## 2020-09-18 DIAGNOSIS — M21371 Foot drop, right foot: Secondary | ICD-10-CM | POA: Diagnosis not present

## 2020-09-18 DIAGNOSIS — N3281 Overactive bladder: Secondary | ICD-10-CM | POA: Diagnosis not present

## 2020-09-18 DIAGNOSIS — G894 Chronic pain syndrome: Secondary | ICD-10-CM | POA: Diagnosis not present

## 2020-09-19 DIAGNOSIS — I10 Essential (primary) hypertension: Secondary | ICD-10-CM | POA: Diagnosis not present

## 2020-09-19 DIAGNOSIS — G2581 Restless legs syndrome: Secondary | ICD-10-CM | POA: Diagnosis not present

## 2020-09-19 DIAGNOSIS — N390 Urinary tract infection, site not specified: Secondary | ICD-10-CM | POA: Diagnosis not present

## 2020-09-19 DIAGNOSIS — Z79891 Long term (current) use of opiate analgesic: Secondary | ICD-10-CM | POA: Diagnosis not present

## 2020-09-19 DIAGNOSIS — Z87442 Personal history of urinary calculi: Secondary | ICD-10-CM | POA: Diagnosis not present

## 2020-09-19 DIAGNOSIS — I700 Atherosclerosis of aorta: Secondary | ICD-10-CM | POA: Diagnosis not present

## 2020-09-19 DIAGNOSIS — Z466 Encounter for fitting and adjustment of urinary device: Secondary | ICD-10-CM | POA: Diagnosis not present

## 2020-09-19 DIAGNOSIS — G4730 Sleep apnea, unspecified: Secondary | ICD-10-CM | POA: Diagnosis not present

## 2020-09-19 DIAGNOSIS — M21371 Foot drop, right foot: Secondary | ICD-10-CM | POA: Diagnosis not present

## 2020-09-19 DIAGNOSIS — E782 Mixed hyperlipidemia: Secondary | ICD-10-CM | POA: Diagnosis not present

## 2020-09-19 DIAGNOSIS — Z9181 History of falling: Secondary | ICD-10-CM | POA: Diagnosis not present

## 2020-09-19 DIAGNOSIS — M1991 Primary osteoarthritis, unspecified site: Secondary | ICD-10-CM | POA: Diagnosis not present

## 2020-09-19 DIAGNOSIS — R338 Other retention of urine: Secondary | ICD-10-CM | POA: Diagnosis not present

## 2020-09-19 DIAGNOSIS — N3281 Overactive bladder: Secondary | ICD-10-CM | POA: Diagnosis not present

## 2020-09-19 DIAGNOSIS — G629 Polyneuropathy, unspecified: Secondary | ICD-10-CM | POA: Diagnosis not present

## 2020-09-19 DIAGNOSIS — G894 Chronic pain syndrome: Secondary | ICD-10-CM | POA: Diagnosis not present

## 2020-09-20 DIAGNOSIS — M21371 Foot drop, right foot: Secondary | ICD-10-CM | POA: Diagnosis not present

## 2020-09-20 DIAGNOSIS — Z79891 Long term (current) use of opiate analgesic: Secondary | ICD-10-CM | POA: Diagnosis not present

## 2020-09-20 DIAGNOSIS — G4730 Sleep apnea, unspecified: Secondary | ICD-10-CM | POA: Diagnosis not present

## 2020-09-20 DIAGNOSIS — N3281 Overactive bladder: Secondary | ICD-10-CM | POA: Diagnosis not present

## 2020-09-20 DIAGNOSIS — E782 Mixed hyperlipidemia: Secondary | ICD-10-CM | POA: Diagnosis not present

## 2020-09-20 DIAGNOSIS — Z466 Encounter for fitting and adjustment of urinary device: Secondary | ICD-10-CM | POA: Diagnosis not present

## 2020-09-20 DIAGNOSIS — I10 Essential (primary) hypertension: Secondary | ICD-10-CM | POA: Diagnosis not present

## 2020-09-20 DIAGNOSIS — Z9181 History of falling: Secondary | ICD-10-CM | POA: Diagnosis not present

## 2020-09-20 DIAGNOSIS — I700 Atherosclerosis of aorta: Secondary | ICD-10-CM | POA: Diagnosis not present

## 2020-09-20 DIAGNOSIS — M1991 Primary osteoarthritis, unspecified site: Secondary | ICD-10-CM | POA: Diagnosis not present

## 2020-09-20 DIAGNOSIS — G894 Chronic pain syndrome: Secondary | ICD-10-CM | POA: Diagnosis not present

## 2020-09-20 DIAGNOSIS — G2581 Restless legs syndrome: Secondary | ICD-10-CM | POA: Diagnosis not present

## 2020-09-20 DIAGNOSIS — N390 Urinary tract infection, site not specified: Secondary | ICD-10-CM | POA: Diagnosis not present

## 2020-09-20 DIAGNOSIS — G629 Polyneuropathy, unspecified: Secondary | ICD-10-CM | POA: Diagnosis not present

## 2020-09-20 DIAGNOSIS — R338 Other retention of urine: Secondary | ICD-10-CM | POA: Diagnosis not present

## 2020-09-20 DIAGNOSIS — Z87442 Personal history of urinary calculi: Secondary | ICD-10-CM | POA: Diagnosis not present

## 2020-09-23 DIAGNOSIS — Z79891 Long term (current) use of opiate analgesic: Secondary | ICD-10-CM | POA: Diagnosis not present

## 2020-09-23 DIAGNOSIS — E782 Mixed hyperlipidemia: Secondary | ICD-10-CM | POA: Diagnosis not present

## 2020-09-23 DIAGNOSIS — N3281 Overactive bladder: Secondary | ICD-10-CM | POA: Diagnosis not present

## 2020-09-23 DIAGNOSIS — G4730 Sleep apnea, unspecified: Secondary | ICD-10-CM | POA: Diagnosis not present

## 2020-09-23 DIAGNOSIS — M21371 Foot drop, right foot: Secondary | ICD-10-CM | POA: Diagnosis not present

## 2020-09-23 DIAGNOSIS — Z9181 History of falling: Secondary | ICD-10-CM | POA: Diagnosis not present

## 2020-09-23 DIAGNOSIS — G894 Chronic pain syndrome: Secondary | ICD-10-CM | POA: Diagnosis not present

## 2020-09-23 DIAGNOSIS — G629 Polyneuropathy, unspecified: Secondary | ICD-10-CM | POA: Diagnosis not present

## 2020-09-23 DIAGNOSIS — G2581 Restless legs syndrome: Secondary | ICD-10-CM | POA: Diagnosis not present

## 2020-09-23 DIAGNOSIS — I700 Atherosclerosis of aorta: Secondary | ICD-10-CM | POA: Diagnosis not present

## 2020-09-23 DIAGNOSIS — R338 Other retention of urine: Secondary | ICD-10-CM | POA: Diagnosis not present

## 2020-09-23 DIAGNOSIS — I10 Essential (primary) hypertension: Secondary | ICD-10-CM | POA: Diagnosis not present

## 2020-09-23 DIAGNOSIS — Z87442 Personal history of urinary calculi: Secondary | ICD-10-CM | POA: Diagnosis not present

## 2020-09-23 DIAGNOSIS — M1991 Primary osteoarthritis, unspecified site: Secondary | ICD-10-CM | POA: Diagnosis not present

## 2020-09-23 DIAGNOSIS — Z466 Encounter for fitting and adjustment of urinary device: Secondary | ICD-10-CM | POA: Diagnosis not present

## 2020-09-23 DIAGNOSIS — N390 Urinary tract infection, site not specified: Secondary | ICD-10-CM | POA: Diagnosis not present

## 2020-09-24 DIAGNOSIS — M5136 Other intervertebral disc degeneration, lumbar region: Secondary | ICD-10-CM | POA: Diagnosis not present

## 2020-09-24 DIAGNOSIS — G894 Chronic pain syndrome: Secondary | ICD-10-CM | POA: Diagnosis not present

## 2020-09-26 DIAGNOSIS — N3281 Overactive bladder: Secondary | ICD-10-CM | POA: Diagnosis not present

## 2020-09-26 DIAGNOSIS — G894 Chronic pain syndrome: Secondary | ICD-10-CM | POA: Diagnosis not present

## 2020-09-26 DIAGNOSIS — R338 Other retention of urine: Secondary | ICD-10-CM | POA: Diagnosis not present

## 2020-09-26 DIAGNOSIS — M1991 Primary osteoarthritis, unspecified site: Secondary | ICD-10-CM | POA: Diagnosis not present

## 2020-09-26 DIAGNOSIS — G2581 Restless legs syndrome: Secondary | ICD-10-CM | POA: Diagnosis not present

## 2020-09-26 DIAGNOSIS — I10 Essential (primary) hypertension: Secondary | ICD-10-CM | POA: Diagnosis not present

## 2020-09-26 DIAGNOSIS — Z466 Encounter for fitting and adjustment of urinary device: Secondary | ICD-10-CM | POA: Diagnosis not present

## 2020-09-26 DIAGNOSIS — E782 Mixed hyperlipidemia: Secondary | ICD-10-CM | POA: Diagnosis not present

## 2020-09-26 DIAGNOSIS — G629 Polyneuropathy, unspecified: Secondary | ICD-10-CM | POA: Diagnosis not present

## 2020-09-26 DIAGNOSIS — I700 Atherosclerosis of aorta: Secondary | ICD-10-CM | POA: Diagnosis not present

## 2020-09-26 DIAGNOSIS — G4730 Sleep apnea, unspecified: Secondary | ICD-10-CM | POA: Diagnosis not present

## 2020-09-26 DIAGNOSIS — Z79891 Long term (current) use of opiate analgesic: Secondary | ICD-10-CM | POA: Diagnosis not present

## 2020-09-26 DIAGNOSIS — Z9181 History of falling: Secondary | ICD-10-CM | POA: Diagnosis not present

## 2020-09-26 DIAGNOSIS — Z87442 Personal history of urinary calculi: Secondary | ICD-10-CM | POA: Diagnosis not present

## 2020-09-26 DIAGNOSIS — M21371 Foot drop, right foot: Secondary | ICD-10-CM | POA: Diagnosis not present

## 2020-09-26 DIAGNOSIS — N390 Urinary tract infection, site not specified: Secondary | ICD-10-CM | POA: Diagnosis not present

## 2020-09-27 DIAGNOSIS — I700 Atherosclerosis of aorta: Secondary | ICD-10-CM | POA: Diagnosis not present

## 2020-09-27 DIAGNOSIS — R338 Other retention of urine: Secondary | ICD-10-CM | POA: Diagnosis not present

## 2020-09-27 DIAGNOSIS — N390 Urinary tract infection, site not specified: Secondary | ICD-10-CM | POA: Diagnosis not present

## 2020-09-27 DIAGNOSIS — N3281 Overactive bladder: Secondary | ICD-10-CM | POA: Diagnosis not present

## 2020-09-27 DIAGNOSIS — Z79891 Long term (current) use of opiate analgesic: Secondary | ICD-10-CM | POA: Diagnosis not present

## 2020-09-27 DIAGNOSIS — I10 Essential (primary) hypertension: Secondary | ICD-10-CM | POA: Diagnosis not present

## 2020-09-27 DIAGNOSIS — Z466 Encounter for fitting and adjustment of urinary device: Secondary | ICD-10-CM | POA: Diagnosis not present

## 2020-09-27 DIAGNOSIS — M1991 Primary osteoarthritis, unspecified site: Secondary | ICD-10-CM | POA: Diagnosis not present

## 2020-09-27 DIAGNOSIS — G894 Chronic pain syndrome: Secondary | ICD-10-CM | POA: Diagnosis not present

## 2020-09-27 DIAGNOSIS — E782 Mixed hyperlipidemia: Secondary | ICD-10-CM | POA: Diagnosis not present

## 2020-09-27 DIAGNOSIS — G629 Polyneuropathy, unspecified: Secondary | ICD-10-CM | POA: Diagnosis not present

## 2020-09-27 DIAGNOSIS — Z87442 Personal history of urinary calculi: Secondary | ICD-10-CM | POA: Diagnosis not present

## 2020-09-27 DIAGNOSIS — M21371 Foot drop, right foot: Secondary | ICD-10-CM | POA: Diagnosis not present

## 2020-09-27 DIAGNOSIS — G2581 Restless legs syndrome: Secondary | ICD-10-CM | POA: Diagnosis not present

## 2020-09-27 DIAGNOSIS — Z9181 History of falling: Secondary | ICD-10-CM | POA: Diagnosis not present

## 2020-09-27 DIAGNOSIS — G4730 Sleep apnea, unspecified: Secondary | ICD-10-CM | POA: Diagnosis not present

## 2020-09-30 DIAGNOSIS — M1991 Primary osteoarthritis, unspecified site: Secondary | ICD-10-CM | POA: Diagnosis not present

## 2020-09-30 DIAGNOSIS — E782 Mixed hyperlipidemia: Secondary | ICD-10-CM | POA: Diagnosis not present

## 2020-09-30 DIAGNOSIS — G2581 Restless legs syndrome: Secondary | ICD-10-CM | POA: Diagnosis not present

## 2020-09-30 DIAGNOSIS — Z87442 Personal history of urinary calculi: Secondary | ICD-10-CM | POA: Diagnosis not present

## 2020-09-30 DIAGNOSIS — R0601 Orthopnea: Secondary | ICD-10-CM | POA: Diagnosis not present

## 2020-09-30 DIAGNOSIS — I700 Atherosclerosis of aorta: Secondary | ICD-10-CM | POA: Diagnosis not present

## 2020-09-30 DIAGNOSIS — M21371 Foot drop, right foot: Secondary | ICD-10-CM | POA: Diagnosis not present

## 2020-09-30 DIAGNOSIS — Z9181 History of falling: Secondary | ICD-10-CM | POA: Diagnosis not present

## 2020-09-30 DIAGNOSIS — Z79891 Long term (current) use of opiate analgesic: Secondary | ICD-10-CM | POA: Diagnosis not present

## 2020-09-30 DIAGNOSIS — N390 Urinary tract infection, site not specified: Secondary | ICD-10-CM | POA: Diagnosis not present

## 2020-09-30 DIAGNOSIS — G629 Polyneuropathy, unspecified: Secondary | ICD-10-CM | POA: Diagnosis not present

## 2020-09-30 DIAGNOSIS — G4730 Sleep apnea, unspecified: Secondary | ICD-10-CM | POA: Diagnosis not present

## 2020-09-30 DIAGNOSIS — I10 Essential (primary) hypertension: Secondary | ICD-10-CM | POA: Diagnosis not present

## 2020-09-30 DIAGNOSIS — N3281 Overactive bladder: Secondary | ICD-10-CM | POA: Diagnosis not present

## 2020-09-30 DIAGNOSIS — R338 Other retention of urine: Secondary | ICD-10-CM | POA: Diagnosis not present

## 2020-09-30 DIAGNOSIS — Z466 Encounter for fitting and adjustment of urinary device: Secondary | ICD-10-CM | POA: Diagnosis not present

## 2020-09-30 DIAGNOSIS — G894 Chronic pain syndrome: Secondary | ICD-10-CM | POA: Diagnosis not present

## 2020-09-30 DIAGNOSIS — Z7409 Other reduced mobility: Secondary | ICD-10-CM | POA: Diagnosis not present

## 2020-10-03 DIAGNOSIS — Z87442 Personal history of urinary calculi: Secondary | ICD-10-CM | POA: Diagnosis not present

## 2020-10-03 DIAGNOSIS — Z466 Encounter for fitting and adjustment of urinary device: Secondary | ICD-10-CM | POA: Diagnosis not present

## 2020-10-03 DIAGNOSIS — R338 Other retention of urine: Secondary | ICD-10-CM | POA: Diagnosis not present

## 2020-10-03 DIAGNOSIS — G894 Chronic pain syndrome: Secondary | ICD-10-CM | POA: Diagnosis not present

## 2020-10-03 DIAGNOSIS — G4730 Sleep apnea, unspecified: Secondary | ICD-10-CM | POA: Diagnosis not present

## 2020-10-03 DIAGNOSIS — G2581 Restless legs syndrome: Secondary | ICD-10-CM | POA: Diagnosis not present

## 2020-10-03 DIAGNOSIS — Z9181 History of falling: Secondary | ICD-10-CM | POA: Diagnosis not present

## 2020-10-03 DIAGNOSIS — Z79891 Long term (current) use of opiate analgesic: Secondary | ICD-10-CM | POA: Diagnosis not present

## 2020-10-03 DIAGNOSIS — E782 Mixed hyperlipidemia: Secondary | ICD-10-CM | POA: Diagnosis not present

## 2020-10-03 DIAGNOSIS — M1991 Primary osteoarthritis, unspecified site: Secondary | ICD-10-CM | POA: Diagnosis not present

## 2020-10-03 DIAGNOSIS — I700 Atherosclerosis of aorta: Secondary | ICD-10-CM | POA: Diagnosis not present

## 2020-10-03 DIAGNOSIS — M21371 Foot drop, right foot: Secondary | ICD-10-CM | POA: Diagnosis not present

## 2020-10-03 DIAGNOSIS — N3281 Overactive bladder: Secondary | ICD-10-CM | POA: Diagnosis not present

## 2020-10-03 DIAGNOSIS — G629 Polyneuropathy, unspecified: Secondary | ICD-10-CM | POA: Diagnosis not present

## 2020-10-03 DIAGNOSIS — N390 Urinary tract infection, site not specified: Secondary | ICD-10-CM | POA: Diagnosis not present

## 2020-10-03 DIAGNOSIS — I10 Essential (primary) hypertension: Secondary | ICD-10-CM | POA: Diagnosis not present

## 2020-10-04 DIAGNOSIS — G4730 Sleep apnea, unspecified: Secondary | ICD-10-CM | POA: Diagnosis not present

## 2020-10-04 DIAGNOSIS — Z79891 Long term (current) use of opiate analgesic: Secondary | ICD-10-CM | POA: Diagnosis not present

## 2020-10-04 DIAGNOSIS — I700 Atherosclerosis of aorta: Secondary | ICD-10-CM | POA: Diagnosis not present

## 2020-10-04 DIAGNOSIS — G629 Polyneuropathy, unspecified: Secondary | ICD-10-CM | POA: Diagnosis not present

## 2020-10-04 DIAGNOSIS — E782 Mixed hyperlipidemia: Secondary | ICD-10-CM | POA: Diagnosis not present

## 2020-10-04 DIAGNOSIS — M1991 Primary osteoarthritis, unspecified site: Secondary | ICD-10-CM | POA: Diagnosis not present

## 2020-10-04 DIAGNOSIS — N390 Urinary tract infection, site not specified: Secondary | ICD-10-CM | POA: Diagnosis not present

## 2020-10-04 DIAGNOSIS — G2581 Restless legs syndrome: Secondary | ICD-10-CM | POA: Diagnosis not present

## 2020-10-04 DIAGNOSIS — I10 Essential (primary) hypertension: Secondary | ICD-10-CM | POA: Diagnosis not present

## 2020-10-04 DIAGNOSIS — Z466 Encounter for fitting and adjustment of urinary device: Secondary | ICD-10-CM | POA: Diagnosis not present

## 2020-10-04 DIAGNOSIS — Z9181 History of falling: Secondary | ICD-10-CM | POA: Diagnosis not present

## 2020-10-04 DIAGNOSIS — M21371 Foot drop, right foot: Secondary | ICD-10-CM | POA: Diagnosis not present

## 2020-10-04 DIAGNOSIS — Z87442 Personal history of urinary calculi: Secondary | ICD-10-CM | POA: Diagnosis not present

## 2020-10-04 DIAGNOSIS — G894 Chronic pain syndrome: Secondary | ICD-10-CM | POA: Diagnosis not present

## 2020-10-04 DIAGNOSIS — N3281 Overactive bladder: Secondary | ICD-10-CM | POA: Diagnosis not present

## 2020-10-04 DIAGNOSIS — R338 Other retention of urine: Secondary | ICD-10-CM | POA: Diagnosis not present

## 2020-10-07 DIAGNOSIS — Z87442 Personal history of urinary calculi: Secondary | ICD-10-CM | POA: Diagnosis not present

## 2020-10-07 DIAGNOSIS — E782 Mixed hyperlipidemia: Secondary | ICD-10-CM | POA: Diagnosis not present

## 2020-10-07 DIAGNOSIS — I10 Essential (primary) hypertension: Secondary | ICD-10-CM | POA: Diagnosis not present

## 2020-10-07 DIAGNOSIS — Z79891 Long term (current) use of opiate analgesic: Secondary | ICD-10-CM | POA: Diagnosis not present

## 2020-10-07 DIAGNOSIS — G629 Polyneuropathy, unspecified: Secondary | ICD-10-CM | POA: Diagnosis not present

## 2020-10-07 DIAGNOSIS — N390 Urinary tract infection, site not specified: Secondary | ICD-10-CM | POA: Diagnosis not present

## 2020-10-07 DIAGNOSIS — N3281 Overactive bladder: Secondary | ICD-10-CM | POA: Diagnosis not present

## 2020-10-07 DIAGNOSIS — Z9181 History of falling: Secondary | ICD-10-CM | POA: Diagnosis not present

## 2020-10-07 DIAGNOSIS — R338 Other retention of urine: Secondary | ICD-10-CM | POA: Diagnosis not present

## 2020-10-07 DIAGNOSIS — M21371 Foot drop, right foot: Secondary | ICD-10-CM | POA: Diagnosis not present

## 2020-10-07 DIAGNOSIS — M1991 Primary osteoarthritis, unspecified site: Secondary | ICD-10-CM | POA: Diagnosis not present

## 2020-10-07 DIAGNOSIS — Z466 Encounter for fitting and adjustment of urinary device: Secondary | ICD-10-CM | POA: Diagnosis not present

## 2020-10-07 DIAGNOSIS — G2581 Restless legs syndrome: Secondary | ICD-10-CM | POA: Diagnosis not present

## 2020-10-07 DIAGNOSIS — G894 Chronic pain syndrome: Secondary | ICD-10-CM | POA: Diagnosis not present

## 2020-10-07 DIAGNOSIS — G4730 Sleep apnea, unspecified: Secondary | ICD-10-CM | POA: Diagnosis not present

## 2020-10-07 DIAGNOSIS — I700 Atherosclerosis of aorta: Secondary | ICD-10-CM | POA: Diagnosis not present

## 2020-10-08 DIAGNOSIS — N3281 Overactive bladder: Secondary | ICD-10-CM | POA: Diagnosis not present

## 2020-10-08 DIAGNOSIS — Z466 Encounter for fitting and adjustment of urinary device: Secondary | ICD-10-CM | POA: Diagnosis not present

## 2020-10-08 DIAGNOSIS — G4730 Sleep apnea, unspecified: Secondary | ICD-10-CM | POA: Diagnosis not present

## 2020-10-08 DIAGNOSIS — G629 Polyneuropathy, unspecified: Secondary | ICD-10-CM | POA: Diagnosis not present

## 2020-10-08 DIAGNOSIS — N390 Urinary tract infection, site not specified: Secondary | ICD-10-CM | POA: Diagnosis not present

## 2020-10-08 DIAGNOSIS — G894 Chronic pain syndrome: Secondary | ICD-10-CM | POA: Diagnosis not present

## 2020-10-08 DIAGNOSIS — Z9181 History of falling: Secondary | ICD-10-CM | POA: Diagnosis not present

## 2020-10-08 DIAGNOSIS — I10 Essential (primary) hypertension: Secondary | ICD-10-CM | POA: Diagnosis not present

## 2020-10-08 DIAGNOSIS — Z79891 Long term (current) use of opiate analgesic: Secondary | ICD-10-CM | POA: Diagnosis not present

## 2020-10-08 DIAGNOSIS — M1991 Primary osteoarthritis, unspecified site: Secondary | ICD-10-CM | POA: Diagnosis not present

## 2020-10-08 DIAGNOSIS — I700 Atherosclerosis of aorta: Secondary | ICD-10-CM | POA: Diagnosis not present

## 2020-10-08 DIAGNOSIS — G2581 Restless legs syndrome: Secondary | ICD-10-CM | POA: Diagnosis not present

## 2020-10-08 DIAGNOSIS — E782 Mixed hyperlipidemia: Secondary | ICD-10-CM | POA: Diagnosis not present

## 2020-10-08 DIAGNOSIS — Z87442 Personal history of urinary calculi: Secondary | ICD-10-CM | POA: Diagnosis not present

## 2020-10-08 DIAGNOSIS — R338 Other retention of urine: Secondary | ICD-10-CM | POA: Diagnosis not present

## 2020-10-08 DIAGNOSIS — M21371 Foot drop, right foot: Secondary | ICD-10-CM | POA: Diagnosis not present

## 2020-10-09 DIAGNOSIS — M21371 Foot drop, right foot: Secondary | ICD-10-CM | POA: Diagnosis not present

## 2020-10-09 DIAGNOSIS — R338 Other retention of urine: Secondary | ICD-10-CM | POA: Diagnosis not present

## 2020-10-09 DIAGNOSIS — G894 Chronic pain syndrome: Secondary | ICD-10-CM | POA: Diagnosis not present

## 2020-10-09 DIAGNOSIS — Z466 Encounter for fitting and adjustment of urinary device: Secondary | ICD-10-CM | POA: Diagnosis not present

## 2020-10-09 DIAGNOSIS — I700 Atherosclerosis of aorta: Secondary | ICD-10-CM | POA: Diagnosis not present

## 2020-10-09 DIAGNOSIS — M1991 Primary osteoarthritis, unspecified site: Secondary | ICD-10-CM | POA: Diagnosis not present

## 2020-10-09 DIAGNOSIS — I10 Essential (primary) hypertension: Secondary | ICD-10-CM | POA: Diagnosis not present

## 2020-10-09 DIAGNOSIS — G4730 Sleep apnea, unspecified: Secondary | ICD-10-CM | POA: Diagnosis not present

## 2020-10-09 DIAGNOSIS — Z87442 Personal history of urinary calculi: Secondary | ICD-10-CM | POA: Diagnosis not present

## 2020-10-09 DIAGNOSIS — N390 Urinary tract infection, site not specified: Secondary | ICD-10-CM | POA: Diagnosis not present

## 2020-10-09 DIAGNOSIS — G2581 Restless legs syndrome: Secondary | ICD-10-CM | POA: Diagnosis not present

## 2020-10-09 DIAGNOSIS — N3281 Overactive bladder: Secondary | ICD-10-CM | POA: Diagnosis not present

## 2020-10-09 DIAGNOSIS — G629 Polyneuropathy, unspecified: Secondary | ICD-10-CM | POA: Diagnosis not present

## 2020-10-09 DIAGNOSIS — Z9181 History of falling: Secondary | ICD-10-CM | POA: Diagnosis not present

## 2020-10-09 DIAGNOSIS — E782 Mixed hyperlipidemia: Secondary | ICD-10-CM | POA: Diagnosis not present

## 2020-10-09 DIAGNOSIS — Z79891 Long term (current) use of opiate analgesic: Secondary | ICD-10-CM | POA: Diagnosis not present

## 2020-10-11 DIAGNOSIS — I10 Essential (primary) hypertension: Secondary | ICD-10-CM | POA: Diagnosis not present

## 2020-10-11 DIAGNOSIS — G4730 Sleep apnea, unspecified: Secondary | ICD-10-CM | POA: Diagnosis not present

## 2020-10-11 DIAGNOSIS — R338 Other retention of urine: Secondary | ICD-10-CM | POA: Diagnosis not present

## 2020-10-11 DIAGNOSIS — I700 Atherosclerosis of aorta: Secondary | ICD-10-CM | POA: Diagnosis not present

## 2020-10-11 DIAGNOSIS — N390 Urinary tract infection, site not specified: Secondary | ICD-10-CM | POA: Diagnosis not present

## 2020-10-11 DIAGNOSIS — E782 Mixed hyperlipidemia: Secondary | ICD-10-CM | POA: Diagnosis not present

## 2020-10-11 DIAGNOSIS — Z87442 Personal history of urinary calculi: Secondary | ICD-10-CM | POA: Diagnosis not present

## 2020-10-11 DIAGNOSIS — G2581 Restless legs syndrome: Secondary | ICD-10-CM | POA: Diagnosis not present

## 2020-10-11 DIAGNOSIS — M21371 Foot drop, right foot: Secondary | ICD-10-CM | POA: Diagnosis not present

## 2020-10-11 DIAGNOSIS — Z466 Encounter for fitting and adjustment of urinary device: Secondary | ICD-10-CM | POA: Diagnosis not present

## 2020-10-11 DIAGNOSIS — Z79891 Long term (current) use of opiate analgesic: Secondary | ICD-10-CM | POA: Diagnosis not present

## 2020-10-11 DIAGNOSIS — M1991 Primary osteoarthritis, unspecified site: Secondary | ICD-10-CM | POA: Diagnosis not present

## 2020-10-11 DIAGNOSIS — Z9181 History of falling: Secondary | ICD-10-CM | POA: Diagnosis not present

## 2020-10-11 DIAGNOSIS — N3281 Overactive bladder: Secondary | ICD-10-CM | POA: Diagnosis not present

## 2020-10-11 DIAGNOSIS — G629 Polyneuropathy, unspecified: Secondary | ICD-10-CM | POA: Diagnosis not present

## 2020-10-11 DIAGNOSIS — G894 Chronic pain syndrome: Secondary | ICD-10-CM | POA: Diagnosis not present

## 2020-10-15 DIAGNOSIS — R338 Other retention of urine: Secondary | ICD-10-CM | POA: Diagnosis not present

## 2020-10-15 DIAGNOSIS — Z87442 Personal history of urinary calculi: Secondary | ICD-10-CM | POA: Diagnosis not present

## 2020-10-15 DIAGNOSIS — Z466 Encounter for fitting and adjustment of urinary device: Secondary | ICD-10-CM | POA: Diagnosis not present

## 2020-10-15 DIAGNOSIS — N390 Urinary tract infection, site not specified: Secondary | ICD-10-CM | POA: Diagnosis not present

## 2020-10-15 DIAGNOSIS — G4730 Sleep apnea, unspecified: Secondary | ICD-10-CM | POA: Diagnosis not present

## 2020-10-15 DIAGNOSIS — E782 Mixed hyperlipidemia: Secondary | ICD-10-CM | POA: Diagnosis not present

## 2020-10-15 DIAGNOSIS — Z79891 Long term (current) use of opiate analgesic: Secondary | ICD-10-CM | POA: Diagnosis not present

## 2020-10-15 DIAGNOSIS — M1991 Primary osteoarthritis, unspecified site: Secondary | ICD-10-CM | POA: Diagnosis not present

## 2020-10-15 DIAGNOSIS — M21371 Foot drop, right foot: Secondary | ICD-10-CM | POA: Diagnosis not present

## 2020-10-15 DIAGNOSIS — G2581 Restless legs syndrome: Secondary | ICD-10-CM | POA: Diagnosis not present

## 2020-10-15 DIAGNOSIS — I10 Essential (primary) hypertension: Secondary | ICD-10-CM | POA: Diagnosis not present

## 2020-10-15 DIAGNOSIS — I700 Atherosclerosis of aorta: Secondary | ICD-10-CM | POA: Diagnosis not present

## 2020-10-15 DIAGNOSIS — N3281 Overactive bladder: Secondary | ICD-10-CM | POA: Diagnosis not present

## 2020-10-15 DIAGNOSIS — G894 Chronic pain syndrome: Secondary | ICD-10-CM | POA: Diagnosis not present

## 2020-10-15 DIAGNOSIS — G629 Polyneuropathy, unspecified: Secondary | ICD-10-CM | POA: Diagnosis not present

## 2020-10-15 DIAGNOSIS — Z9181 History of falling: Secondary | ICD-10-CM | POA: Diagnosis not present

## 2020-10-17 DIAGNOSIS — N390 Urinary tract infection, site not specified: Secondary | ICD-10-CM | POA: Diagnosis not present

## 2020-10-17 DIAGNOSIS — M1991 Primary osteoarthritis, unspecified site: Secondary | ICD-10-CM | POA: Diagnosis not present

## 2020-10-17 DIAGNOSIS — I700 Atherosclerosis of aorta: Secondary | ICD-10-CM | POA: Diagnosis not present

## 2020-10-17 DIAGNOSIS — I10 Essential (primary) hypertension: Secondary | ICD-10-CM | POA: Diagnosis not present

## 2020-10-17 DIAGNOSIS — E782 Mixed hyperlipidemia: Secondary | ICD-10-CM | POA: Diagnosis not present

## 2020-10-17 DIAGNOSIS — N3281 Overactive bladder: Secondary | ICD-10-CM | POA: Diagnosis not present

## 2020-10-17 DIAGNOSIS — Z9181 History of falling: Secondary | ICD-10-CM | POA: Diagnosis not present

## 2020-10-17 DIAGNOSIS — R338 Other retention of urine: Secondary | ICD-10-CM | POA: Diagnosis not present

## 2020-10-17 DIAGNOSIS — Z466 Encounter for fitting and adjustment of urinary device: Secondary | ICD-10-CM | POA: Diagnosis not present

## 2020-10-17 DIAGNOSIS — G2581 Restless legs syndrome: Secondary | ICD-10-CM | POA: Diagnosis not present

## 2020-10-17 DIAGNOSIS — M21371 Foot drop, right foot: Secondary | ICD-10-CM | POA: Diagnosis not present

## 2020-10-17 DIAGNOSIS — G4730 Sleep apnea, unspecified: Secondary | ICD-10-CM | POA: Diagnosis not present

## 2020-10-17 DIAGNOSIS — G894 Chronic pain syndrome: Secondary | ICD-10-CM | POA: Diagnosis not present

## 2020-10-17 DIAGNOSIS — Z87442 Personal history of urinary calculi: Secondary | ICD-10-CM | POA: Diagnosis not present

## 2020-10-17 DIAGNOSIS — Z79891 Long term (current) use of opiate analgesic: Secondary | ICD-10-CM | POA: Diagnosis not present

## 2020-10-17 DIAGNOSIS — G629 Polyneuropathy, unspecified: Secondary | ICD-10-CM | POA: Diagnosis not present

## 2020-10-21 DIAGNOSIS — Z87442 Personal history of urinary calculi: Secondary | ICD-10-CM | POA: Diagnosis not present

## 2020-10-21 DIAGNOSIS — M21371 Foot drop, right foot: Secondary | ICD-10-CM | POA: Diagnosis not present

## 2020-10-21 DIAGNOSIS — G894 Chronic pain syndrome: Secondary | ICD-10-CM | POA: Diagnosis not present

## 2020-10-21 DIAGNOSIS — Z466 Encounter for fitting and adjustment of urinary device: Secondary | ICD-10-CM | POA: Diagnosis not present

## 2020-10-21 DIAGNOSIS — G2581 Restless legs syndrome: Secondary | ICD-10-CM | POA: Diagnosis not present

## 2020-10-21 DIAGNOSIS — G4730 Sleep apnea, unspecified: Secondary | ICD-10-CM | POA: Diagnosis not present

## 2020-10-21 DIAGNOSIS — E782 Mixed hyperlipidemia: Secondary | ICD-10-CM | POA: Diagnosis not present

## 2020-10-21 DIAGNOSIS — I700 Atherosclerosis of aorta: Secondary | ICD-10-CM | POA: Diagnosis not present

## 2020-10-21 DIAGNOSIS — N3281 Overactive bladder: Secondary | ICD-10-CM | POA: Diagnosis not present

## 2020-10-21 DIAGNOSIS — I10 Essential (primary) hypertension: Secondary | ICD-10-CM | POA: Diagnosis not present

## 2020-10-21 DIAGNOSIS — Z79891 Long term (current) use of opiate analgesic: Secondary | ICD-10-CM | POA: Diagnosis not present

## 2020-10-21 DIAGNOSIS — N390 Urinary tract infection, site not specified: Secondary | ICD-10-CM | POA: Diagnosis not present

## 2020-10-21 DIAGNOSIS — Z9181 History of falling: Secondary | ICD-10-CM | POA: Diagnosis not present

## 2020-10-21 DIAGNOSIS — R338 Other retention of urine: Secondary | ICD-10-CM | POA: Diagnosis not present

## 2020-10-21 DIAGNOSIS — M1991 Primary osteoarthritis, unspecified site: Secondary | ICD-10-CM | POA: Diagnosis not present

## 2020-10-21 DIAGNOSIS — G629 Polyneuropathy, unspecified: Secondary | ICD-10-CM | POA: Diagnosis not present

## 2020-10-23 ENCOUNTER — Other Ambulatory Visit: Payer: Self-pay | Admitting: Adult Health

## 2020-10-23 DIAGNOSIS — R338 Other retention of urine: Secondary | ICD-10-CM | POA: Diagnosis not present

## 2020-10-23 DIAGNOSIS — G894 Chronic pain syndrome: Secondary | ICD-10-CM | POA: Diagnosis not present

## 2020-10-23 DIAGNOSIS — Z87442 Personal history of urinary calculi: Secondary | ICD-10-CM | POA: Diagnosis not present

## 2020-10-23 DIAGNOSIS — M21371 Foot drop, right foot: Secondary | ICD-10-CM | POA: Diagnosis not present

## 2020-10-23 DIAGNOSIS — Z9181 History of falling: Secondary | ICD-10-CM | POA: Diagnosis not present

## 2020-10-23 DIAGNOSIS — I700 Atherosclerosis of aorta: Secondary | ICD-10-CM | POA: Diagnosis not present

## 2020-10-23 DIAGNOSIS — N390 Urinary tract infection, site not specified: Secondary | ICD-10-CM | POA: Diagnosis not present

## 2020-10-23 DIAGNOSIS — N401 Enlarged prostate with lower urinary tract symptoms: Secondary | ICD-10-CM

## 2020-10-23 DIAGNOSIS — G4730 Sleep apnea, unspecified: Secondary | ICD-10-CM | POA: Diagnosis not present

## 2020-10-23 DIAGNOSIS — Z466 Encounter for fitting and adjustment of urinary device: Secondary | ICD-10-CM | POA: Diagnosis not present

## 2020-10-23 DIAGNOSIS — E782 Mixed hyperlipidemia: Secondary | ICD-10-CM | POA: Diagnosis not present

## 2020-10-23 DIAGNOSIS — G629 Polyneuropathy, unspecified: Secondary | ICD-10-CM | POA: Diagnosis not present

## 2020-10-23 DIAGNOSIS — G2581 Restless legs syndrome: Secondary | ICD-10-CM | POA: Diagnosis not present

## 2020-10-23 DIAGNOSIS — I10 Essential (primary) hypertension: Secondary | ICD-10-CM | POA: Diagnosis not present

## 2020-10-23 DIAGNOSIS — N3281 Overactive bladder: Secondary | ICD-10-CM | POA: Diagnosis not present

## 2020-10-23 DIAGNOSIS — M1991 Primary osteoarthritis, unspecified site: Secondary | ICD-10-CM | POA: Diagnosis not present

## 2020-10-23 DIAGNOSIS — Z79891 Long term (current) use of opiate analgesic: Secondary | ICD-10-CM | POA: Diagnosis not present

## 2020-10-28 DIAGNOSIS — Z87442 Personal history of urinary calculi: Secondary | ICD-10-CM | POA: Diagnosis not present

## 2020-10-28 DIAGNOSIS — N3281 Overactive bladder: Secondary | ICD-10-CM | POA: Diagnosis not present

## 2020-10-28 DIAGNOSIS — N390 Urinary tract infection, site not specified: Secondary | ICD-10-CM | POA: Diagnosis not present

## 2020-10-28 DIAGNOSIS — Z79891 Long term (current) use of opiate analgesic: Secondary | ICD-10-CM | POA: Diagnosis not present

## 2020-10-28 DIAGNOSIS — G629 Polyneuropathy, unspecified: Secondary | ICD-10-CM | POA: Diagnosis not present

## 2020-10-28 DIAGNOSIS — Z466 Encounter for fitting and adjustment of urinary device: Secondary | ICD-10-CM | POA: Diagnosis not present

## 2020-10-28 DIAGNOSIS — I10 Essential (primary) hypertension: Secondary | ICD-10-CM | POA: Diagnosis not present

## 2020-10-28 DIAGNOSIS — Z9181 History of falling: Secondary | ICD-10-CM | POA: Diagnosis not present

## 2020-10-28 DIAGNOSIS — G4730 Sleep apnea, unspecified: Secondary | ICD-10-CM | POA: Diagnosis not present

## 2020-10-28 DIAGNOSIS — M1991 Primary osteoarthritis, unspecified site: Secondary | ICD-10-CM | POA: Diagnosis not present

## 2020-10-28 DIAGNOSIS — R338 Other retention of urine: Secondary | ICD-10-CM | POA: Diagnosis not present

## 2020-10-28 DIAGNOSIS — E782 Mixed hyperlipidemia: Secondary | ICD-10-CM | POA: Diagnosis not present

## 2020-10-28 DIAGNOSIS — I700 Atherosclerosis of aorta: Secondary | ICD-10-CM | POA: Diagnosis not present

## 2020-10-28 DIAGNOSIS — M21371 Foot drop, right foot: Secondary | ICD-10-CM | POA: Diagnosis not present

## 2020-10-28 DIAGNOSIS — G894 Chronic pain syndrome: Secondary | ICD-10-CM | POA: Diagnosis not present

## 2020-10-28 DIAGNOSIS — G2581 Restless legs syndrome: Secondary | ICD-10-CM | POA: Diagnosis not present

## 2020-10-29 DIAGNOSIS — M21371 Foot drop, right foot: Secondary | ICD-10-CM | POA: Diagnosis not present

## 2020-10-29 DIAGNOSIS — G4730 Sleep apnea, unspecified: Secondary | ICD-10-CM | POA: Diagnosis not present

## 2020-10-29 DIAGNOSIS — R338 Other retention of urine: Secondary | ICD-10-CM | POA: Diagnosis not present

## 2020-10-29 DIAGNOSIS — G894 Chronic pain syndrome: Secondary | ICD-10-CM | POA: Diagnosis not present

## 2020-10-29 DIAGNOSIS — Z87442 Personal history of urinary calculi: Secondary | ICD-10-CM | POA: Diagnosis not present

## 2020-10-29 DIAGNOSIS — Z9181 History of falling: Secondary | ICD-10-CM | POA: Diagnosis not present

## 2020-10-29 DIAGNOSIS — G629 Polyneuropathy, unspecified: Secondary | ICD-10-CM | POA: Diagnosis not present

## 2020-10-29 DIAGNOSIS — E782 Mixed hyperlipidemia: Secondary | ICD-10-CM | POA: Diagnosis not present

## 2020-10-29 DIAGNOSIS — N390 Urinary tract infection, site not specified: Secondary | ICD-10-CM | POA: Diagnosis not present

## 2020-10-29 DIAGNOSIS — I700 Atherosclerosis of aorta: Secondary | ICD-10-CM | POA: Diagnosis not present

## 2020-10-29 DIAGNOSIS — N3281 Overactive bladder: Secondary | ICD-10-CM | POA: Diagnosis not present

## 2020-10-29 DIAGNOSIS — G2581 Restless legs syndrome: Secondary | ICD-10-CM | POA: Diagnosis not present

## 2020-10-29 DIAGNOSIS — M1991 Primary osteoarthritis, unspecified site: Secondary | ICD-10-CM | POA: Diagnosis not present

## 2020-10-29 DIAGNOSIS — Z466 Encounter for fitting and adjustment of urinary device: Secondary | ICD-10-CM | POA: Diagnosis not present

## 2020-10-29 DIAGNOSIS — I10 Essential (primary) hypertension: Secondary | ICD-10-CM | POA: Diagnosis not present

## 2020-10-29 DIAGNOSIS — Z79891 Long term (current) use of opiate analgesic: Secondary | ICD-10-CM | POA: Diagnosis not present

## 2020-10-30 DIAGNOSIS — E782 Mixed hyperlipidemia: Secondary | ICD-10-CM | POA: Diagnosis not present

## 2020-10-30 DIAGNOSIS — G2581 Restless legs syndrome: Secondary | ICD-10-CM | POA: Diagnosis not present

## 2020-10-30 DIAGNOSIS — G4730 Sleep apnea, unspecified: Secondary | ICD-10-CM | POA: Diagnosis not present

## 2020-10-30 DIAGNOSIS — Z79891 Long term (current) use of opiate analgesic: Secondary | ICD-10-CM | POA: Diagnosis not present

## 2020-10-30 DIAGNOSIS — Z466 Encounter for fitting and adjustment of urinary device: Secondary | ICD-10-CM | POA: Diagnosis not present

## 2020-10-30 DIAGNOSIS — M21371 Foot drop, right foot: Secondary | ICD-10-CM | POA: Diagnosis not present

## 2020-10-30 DIAGNOSIS — N3281 Overactive bladder: Secondary | ICD-10-CM | POA: Diagnosis not present

## 2020-10-30 DIAGNOSIS — Z87442 Personal history of urinary calculi: Secondary | ICD-10-CM | POA: Diagnosis not present

## 2020-10-30 DIAGNOSIS — I700 Atherosclerosis of aorta: Secondary | ICD-10-CM | POA: Diagnosis not present

## 2020-10-30 DIAGNOSIS — N390 Urinary tract infection, site not specified: Secondary | ICD-10-CM | POA: Diagnosis not present

## 2020-10-30 DIAGNOSIS — M1991 Primary osteoarthritis, unspecified site: Secondary | ICD-10-CM | POA: Diagnosis not present

## 2020-10-30 DIAGNOSIS — Z9181 History of falling: Secondary | ICD-10-CM | POA: Diagnosis not present

## 2020-10-30 DIAGNOSIS — G629 Polyneuropathy, unspecified: Secondary | ICD-10-CM | POA: Diagnosis not present

## 2020-10-30 DIAGNOSIS — R338 Other retention of urine: Secondary | ICD-10-CM | POA: Diagnosis not present

## 2020-10-30 DIAGNOSIS — G894 Chronic pain syndrome: Secondary | ICD-10-CM | POA: Diagnosis not present

## 2020-10-30 DIAGNOSIS — I10 Essential (primary) hypertension: Secondary | ICD-10-CM | POA: Diagnosis not present

## 2020-10-31 DIAGNOSIS — M21371 Foot drop, right foot: Secondary | ICD-10-CM | POA: Diagnosis not present

## 2020-10-31 DIAGNOSIS — R0601 Orthopnea: Secondary | ICD-10-CM | POA: Diagnosis not present

## 2020-10-31 DIAGNOSIS — G894 Chronic pain syndrome: Secondary | ICD-10-CM | POA: Diagnosis not present

## 2020-10-31 DIAGNOSIS — Z7409 Other reduced mobility: Secondary | ICD-10-CM | POA: Diagnosis not present

## 2020-11-05 DIAGNOSIS — Z87442 Personal history of urinary calculi: Secondary | ICD-10-CM | POA: Diagnosis not present

## 2020-11-05 DIAGNOSIS — M1991 Primary osteoarthritis, unspecified site: Secondary | ICD-10-CM | POA: Diagnosis not present

## 2020-11-05 DIAGNOSIS — N3281 Overactive bladder: Secondary | ICD-10-CM | POA: Diagnosis not present

## 2020-11-05 DIAGNOSIS — R338 Other retention of urine: Secondary | ICD-10-CM | POA: Diagnosis not present

## 2020-11-05 DIAGNOSIS — G629 Polyneuropathy, unspecified: Secondary | ICD-10-CM | POA: Diagnosis not present

## 2020-11-05 DIAGNOSIS — I700 Atherosclerosis of aorta: Secondary | ICD-10-CM | POA: Diagnosis not present

## 2020-11-05 DIAGNOSIS — E782 Mixed hyperlipidemia: Secondary | ICD-10-CM | POA: Diagnosis not present

## 2020-11-05 DIAGNOSIS — M21371 Foot drop, right foot: Secondary | ICD-10-CM | POA: Diagnosis not present

## 2020-11-05 DIAGNOSIS — Z9181 History of falling: Secondary | ICD-10-CM | POA: Diagnosis not present

## 2020-11-05 DIAGNOSIS — Z466 Encounter for fitting and adjustment of urinary device: Secondary | ICD-10-CM | POA: Diagnosis not present

## 2020-11-05 DIAGNOSIS — Z79891 Long term (current) use of opiate analgesic: Secondary | ICD-10-CM | POA: Diagnosis not present

## 2020-11-05 DIAGNOSIS — N390 Urinary tract infection, site not specified: Secondary | ICD-10-CM | POA: Diagnosis not present

## 2020-11-05 DIAGNOSIS — G894 Chronic pain syndrome: Secondary | ICD-10-CM | POA: Diagnosis not present

## 2020-11-05 DIAGNOSIS — G2581 Restless legs syndrome: Secondary | ICD-10-CM | POA: Diagnosis not present

## 2020-11-05 DIAGNOSIS — I10 Essential (primary) hypertension: Secondary | ICD-10-CM | POA: Diagnosis not present

## 2020-11-05 DIAGNOSIS — G4730 Sleep apnea, unspecified: Secondary | ICD-10-CM | POA: Diagnosis not present

## 2020-11-07 DIAGNOSIS — G2581 Restless legs syndrome: Secondary | ICD-10-CM | POA: Diagnosis not present

## 2020-11-07 DIAGNOSIS — Z87442 Personal history of urinary calculi: Secondary | ICD-10-CM | POA: Diagnosis not present

## 2020-11-07 DIAGNOSIS — N3281 Overactive bladder: Secondary | ICD-10-CM | POA: Diagnosis not present

## 2020-11-07 DIAGNOSIS — M21371 Foot drop, right foot: Secondary | ICD-10-CM | POA: Diagnosis not present

## 2020-11-07 DIAGNOSIS — G894 Chronic pain syndrome: Secondary | ICD-10-CM | POA: Diagnosis not present

## 2020-11-07 DIAGNOSIS — N390 Urinary tract infection, site not specified: Secondary | ICD-10-CM | POA: Diagnosis not present

## 2020-11-07 DIAGNOSIS — Z79891 Long term (current) use of opiate analgesic: Secondary | ICD-10-CM | POA: Diagnosis not present

## 2020-11-07 DIAGNOSIS — Z466 Encounter for fitting and adjustment of urinary device: Secondary | ICD-10-CM | POA: Diagnosis not present

## 2020-11-07 DIAGNOSIS — I700 Atherosclerosis of aorta: Secondary | ICD-10-CM | POA: Diagnosis not present

## 2020-11-07 DIAGNOSIS — I10 Essential (primary) hypertension: Secondary | ICD-10-CM | POA: Diagnosis not present

## 2020-11-07 DIAGNOSIS — Z9181 History of falling: Secondary | ICD-10-CM | POA: Diagnosis not present

## 2020-11-07 DIAGNOSIS — G4730 Sleep apnea, unspecified: Secondary | ICD-10-CM | POA: Diagnosis not present

## 2020-11-07 DIAGNOSIS — R338 Other retention of urine: Secondary | ICD-10-CM | POA: Diagnosis not present

## 2020-11-07 DIAGNOSIS — E782 Mixed hyperlipidemia: Secondary | ICD-10-CM | POA: Diagnosis not present

## 2020-11-07 DIAGNOSIS — M1991 Primary osteoarthritis, unspecified site: Secondary | ICD-10-CM | POA: Diagnosis not present

## 2020-11-07 DIAGNOSIS — G629 Polyneuropathy, unspecified: Secondary | ICD-10-CM | POA: Diagnosis not present

## 2020-11-08 DIAGNOSIS — R338 Other retention of urine: Secondary | ICD-10-CM | POA: Diagnosis not present

## 2020-11-11 DIAGNOSIS — M5136 Other intervertebral disc degeneration, lumbar region: Secondary | ICD-10-CM | POA: Diagnosis not present

## 2020-11-11 DIAGNOSIS — G894 Chronic pain syndrome: Secondary | ICD-10-CM | POA: Diagnosis not present

## 2020-11-12 DIAGNOSIS — Z79891 Long term (current) use of opiate analgesic: Secondary | ICD-10-CM | POA: Diagnosis not present

## 2020-11-12 DIAGNOSIS — R338 Other retention of urine: Secondary | ICD-10-CM | POA: Diagnosis not present

## 2020-11-12 DIAGNOSIS — M1991 Primary osteoarthritis, unspecified site: Secondary | ICD-10-CM | POA: Diagnosis not present

## 2020-11-12 DIAGNOSIS — G4730 Sleep apnea, unspecified: Secondary | ICD-10-CM | POA: Diagnosis not present

## 2020-11-12 DIAGNOSIS — N390 Urinary tract infection, site not specified: Secondary | ICD-10-CM | POA: Diagnosis not present

## 2020-11-12 DIAGNOSIS — G894 Chronic pain syndrome: Secondary | ICD-10-CM | POA: Diagnosis not present

## 2020-11-12 DIAGNOSIS — G2581 Restless legs syndrome: Secondary | ICD-10-CM | POA: Diagnosis not present

## 2020-11-12 DIAGNOSIS — I10 Essential (primary) hypertension: Secondary | ICD-10-CM | POA: Diagnosis not present

## 2020-11-12 DIAGNOSIS — I700 Atherosclerosis of aorta: Secondary | ICD-10-CM | POA: Diagnosis not present

## 2020-11-12 DIAGNOSIS — M21371 Foot drop, right foot: Secondary | ICD-10-CM | POA: Diagnosis not present

## 2020-11-12 DIAGNOSIS — Z466 Encounter for fitting and adjustment of urinary device: Secondary | ICD-10-CM | POA: Diagnosis not present

## 2020-11-12 DIAGNOSIS — E782 Mixed hyperlipidemia: Secondary | ICD-10-CM | POA: Diagnosis not present

## 2020-11-12 DIAGNOSIS — Z87442 Personal history of urinary calculi: Secondary | ICD-10-CM | POA: Diagnosis not present

## 2020-11-12 DIAGNOSIS — Z9181 History of falling: Secondary | ICD-10-CM | POA: Diagnosis not present

## 2020-11-12 DIAGNOSIS — G629 Polyneuropathy, unspecified: Secondary | ICD-10-CM | POA: Diagnosis not present

## 2020-11-12 DIAGNOSIS — N3281 Overactive bladder: Secondary | ICD-10-CM | POA: Diagnosis not present

## 2020-11-14 DIAGNOSIS — Z9181 History of falling: Secondary | ICD-10-CM | POA: Diagnosis not present

## 2020-11-14 DIAGNOSIS — G894 Chronic pain syndrome: Secondary | ICD-10-CM | POA: Diagnosis not present

## 2020-11-14 DIAGNOSIS — Z87442 Personal history of urinary calculi: Secondary | ICD-10-CM | POA: Diagnosis not present

## 2020-11-14 DIAGNOSIS — E782 Mixed hyperlipidemia: Secondary | ICD-10-CM | POA: Diagnosis not present

## 2020-11-14 DIAGNOSIS — I10 Essential (primary) hypertension: Secondary | ICD-10-CM | POA: Diagnosis not present

## 2020-11-14 DIAGNOSIS — R338 Other retention of urine: Secondary | ICD-10-CM | POA: Diagnosis not present

## 2020-11-14 DIAGNOSIS — G4730 Sleep apnea, unspecified: Secondary | ICD-10-CM | POA: Diagnosis not present

## 2020-11-14 DIAGNOSIS — I700 Atherosclerosis of aorta: Secondary | ICD-10-CM | POA: Diagnosis not present

## 2020-11-14 DIAGNOSIS — N390 Urinary tract infection, site not specified: Secondary | ICD-10-CM | POA: Diagnosis not present

## 2020-11-14 DIAGNOSIS — G2581 Restless legs syndrome: Secondary | ICD-10-CM | POA: Diagnosis not present

## 2020-11-14 DIAGNOSIS — Z466 Encounter for fitting and adjustment of urinary device: Secondary | ICD-10-CM | POA: Diagnosis not present

## 2020-11-14 DIAGNOSIS — M1991 Primary osteoarthritis, unspecified site: Secondary | ICD-10-CM | POA: Diagnosis not present

## 2020-11-14 DIAGNOSIS — N3281 Overactive bladder: Secondary | ICD-10-CM | POA: Diagnosis not present

## 2020-11-14 DIAGNOSIS — G629 Polyneuropathy, unspecified: Secondary | ICD-10-CM | POA: Diagnosis not present

## 2020-11-14 DIAGNOSIS — M21371 Foot drop, right foot: Secondary | ICD-10-CM | POA: Diagnosis not present

## 2020-11-14 DIAGNOSIS — Z79891 Long term (current) use of opiate analgesic: Secondary | ICD-10-CM | POA: Diagnosis not present

## 2020-11-15 DIAGNOSIS — Z87442 Personal history of urinary calculi: Secondary | ICD-10-CM | POA: Diagnosis not present

## 2020-11-15 DIAGNOSIS — E782 Mixed hyperlipidemia: Secondary | ICD-10-CM | POA: Diagnosis not present

## 2020-11-15 DIAGNOSIS — N390 Urinary tract infection, site not specified: Secondary | ICD-10-CM | POA: Diagnosis not present

## 2020-11-15 DIAGNOSIS — R338 Other retention of urine: Secondary | ICD-10-CM | POA: Diagnosis not present

## 2020-11-15 DIAGNOSIS — I700 Atherosclerosis of aorta: Secondary | ICD-10-CM | POA: Diagnosis not present

## 2020-11-15 DIAGNOSIS — Z79891 Long term (current) use of opiate analgesic: Secondary | ICD-10-CM | POA: Diagnosis not present

## 2020-11-15 DIAGNOSIS — N3281 Overactive bladder: Secondary | ICD-10-CM | POA: Diagnosis not present

## 2020-11-15 DIAGNOSIS — Z9181 History of falling: Secondary | ICD-10-CM | POA: Diagnosis not present

## 2020-11-15 DIAGNOSIS — G894 Chronic pain syndrome: Secondary | ICD-10-CM | POA: Diagnosis not present

## 2020-11-15 DIAGNOSIS — I10 Essential (primary) hypertension: Secondary | ICD-10-CM | POA: Diagnosis not present

## 2020-11-15 DIAGNOSIS — M1991 Primary osteoarthritis, unspecified site: Secondary | ICD-10-CM | POA: Diagnosis not present

## 2020-11-15 DIAGNOSIS — G4730 Sleep apnea, unspecified: Secondary | ICD-10-CM | POA: Diagnosis not present

## 2020-11-15 DIAGNOSIS — M21371 Foot drop, right foot: Secondary | ICD-10-CM | POA: Diagnosis not present

## 2020-11-15 DIAGNOSIS — G2581 Restless legs syndrome: Secondary | ICD-10-CM | POA: Diagnosis not present

## 2020-11-15 DIAGNOSIS — Z466 Encounter for fitting and adjustment of urinary device: Secondary | ICD-10-CM | POA: Diagnosis not present

## 2020-11-15 DIAGNOSIS — G629 Polyneuropathy, unspecified: Secondary | ICD-10-CM | POA: Diagnosis not present

## 2020-11-18 DIAGNOSIS — N3281 Overactive bladder: Secondary | ICD-10-CM | POA: Diagnosis not present

## 2020-11-18 DIAGNOSIS — Z79891 Long term (current) use of opiate analgesic: Secondary | ICD-10-CM | POA: Diagnosis not present

## 2020-11-18 DIAGNOSIS — M21371 Foot drop, right foot: Secondary | ICD-10-CM | POA: Diagnosis not present

## 2020-11-18 DIAGNOSIS — I700 Atherosclerosis of aorta: Secondary | ICD-10-CM | POA: Diagnosis not present

## 2020-11-18 DIAGNOSIS — I10 Essential (primary) hypertension: Secondary | ICD-10-CM | POA: Diagnosis not present

## 2020-11-18 DIAGNOSIS — G4730 Sleep apnea, unspecified: Secondary | ICD-10-CM | POA: Diagnosis not present

## 2020-11-18 DIAGNOSIS — M1991 Primary osteoarthritis, unspecified site: Secondary | ICD-10-CM | POA: Diagnosis not present

## 2020-11-18 DIAGNOSIS — Z87442 Personal history of urinary calculi: Secondary | ICD-10-CM | POA: Diagnosis not present

## 2020-11-18 DIAGNOSIS — G894 Chronic pain syndrome: Secondary | ICD-10-CM | POA: Diagnosis not present

## 2020-11-18 DIAGNOSIS — E782 Mixed hyperlipidemia: Secondary | ICD-10-CM | POA: Diagnosis not present

## 2020-11-18 DIAGNOSIS — Z466 Encounter for fitting and adjustment of urinary device: Secondary | ICD-10-CM | POA: Diagnosis not present

## 2020-11-18 DIAGNOSIS — G2581 Restless legs syndrome: Secondary | ICD-10-CM | POA: Diagnosis not present

## 2020-11-18 DIAGNOSIS — R338 Other retention of urine: Secondary | ICD-10-CM | POA: Diagnosis not present

## 2020-11-18 DIAGNOSIS — Z9181 History of falling: Secondary | ICD-10-CM | POA: Diagnosis not present

## 2020-11-18 DIAGNOSIS — N390 Urinary tract infection, site not specified: Secondary | ICD-10-CM | POA: Diagnosis not present

## 2020-11-18 DIAGNOSIS — G629 Polyneuropathy, unspecified: Secondary | ICD-10-CM | POA: Diagnosis not present

## 2020-11-19 DIAGNOSIS — Z466 Encounter for fitting and adjustment of urinary device: Secondary | ICD-10-CM | POA: Diagnosis not present

## 2020-11-19 DIAGNOSIS — E782 Mixed hyperlipidemia: Secondary | ICD-10-CM | POA: Diagnosis not present

## 2020-11-19 DIAGNOSIS — N3281 Overactive bladder: Secondary | ICD-10-CM | POA: Diagnosis not present

## 2020-11-19 DIAGNOSIS — M1991 Primary osteoarthritis, unspecified site: Secondary | ICD-10-CM | POA: Diagnosis not present

## 2020-11-19 DIAGNOSIS — Z87442 Personal history of urinary calculi: Secondary | ICD-10-CM | POA: Diagnosis not present

## 2020-11-19 DIAGNOSIS — R338 Other retention of urine: Secondary | ICD-10-CM | POA: Diagnosis not present

## 2020-11-19 DIAGNOSIS — G894 Chronic pain syndrome: Secondary | ICD-10-CM | POA: Diagnosis not present

## 2020-11-19 DIAGNOSIS — Z79891 Long term (current) use of opiate analgesic: Secondary | ICD-10-CM | POA: Diagnosis not present

## 2020-11-19 DIAGNOSIS — G629 Polyneuropathy, unspecified: Secondary | ICD-10-CM | POA: Diagnosis not present

## 2020-11-19 DIAGNOSIS — M21371 Foot drop, right foot: Secondary | ICD-10-CM | POA: Diagnosis not present

## 2020-11-19 DIAGNOSIS — I10 Essential (primary) hypertension: Secondary | ICD-10-CM | POA: Diagnosis not present

## 2020-11-19 DIAGNOSIS — G2581 Restless legs syndrome: Secondary | ICD-10-CM | POA: Diagnosis not present

## 2020-11-19 DIAGNOSIS — Z9181 History of falling: Secondary | ICD-10-CM | POA: Diagnosis not present

## 2020-11-19 DIAGNOSIS — N390 Urinary tract infection, site not specified: Secondary | ICD-10-CM | POA: Diagnosis not present

## 2020-11-19 DIAGNOSIS — I700 Atherosclerosis of aorta: Secondary | ICD-10-CM | POA: Diagnosis not present

## 2020-11-19 DIAGNOSIS — G4730 Sleep apnea, unspecified: Secondary | ICD-10-CM | POA: Diagnosis not present

## 2020-11-20 DIAGNOSIS — I10 Essential (primary) hypertension: Secondary | ICD-10-CM | POA: Diagnosis not present

## 2020-11-20 DIAGNOSIS — Z466 Encounter for fitting and adjustment of urinary device: Secondary | ICD-10-CM | POA: Diagnosis not present

## 2020-11-20 DIAGNOSIS — E782 Mixed hyperlipidemia: Secondary | ICD-10-CM | POA: Diagnosis not present

## 2020-11-20 DIAGNOSIS — R338 Other retention of urine: Secondary | ICD-10-CM | POA: Diagnosis not present

## 2020-11-20 DIAGNOSIS — N390 Urinary tract infection, site not specified: Secondary | ICD-10-CM | POA: Diagnosis not present

## 2020-11-20 DIAGNOSIS — G894 Chronic pain syndrome: Secondary | ICD-10-CM | POA: Diagnosis not present

## 2020-11-20 DIAGNOSIS — G629 Polyneuropathy, unspecified: Secondary | ICD-10-CM | POA: Diagnosis not present

## 2020-11-20 DIAGNOSIS — M21371 Foot drop, right foot: Secondary | ICD-10-CM | POA: Diagnosis not present

## 2020-11-20 DIAGNOSIS — N3281 Overactive bladder: Secondary | ICD-10-CM | POA: Diagnosis not present

## 2020-11-20 DIAGNOSIS — M1991 Primary osteoarthritis, unspecified site: Secondary | ICD-10-CM | POA: Diagnosis not present

## 2020-11-20 DIAGNOSIS — I700 Atherosclerosis of aorta: Secondary | ICD-10-CM | POA: Diagnosis not present

## 2020-11-20 DIAGNOSIS — Z79891 Long term (current) use of opiate analgesic: Secondary | ICD-10-CM | POA: Diagnosis not present

## 2020-11-20 DIAGNOSIS — G2581 Restless legs syndrome: Secondary | ICD-10-CM | POA: Diagnosis not present

## 2020-11-20 DIAGNOSIS — Z87442 Personal history of urinary calculi: Secondary | ICD-10-CM | POA: Diagnosis not present

## 2020-11-20 DIAGNOSIS — Z9181 History of falling: Secondary | ICD-10-CM | POA: Diagnosis not present

## 2020-11-20 DIAGNOSIS — G4730 Sleep apnea, unspecified: Secondary | ICD-10-CM | POA: Diagnosis not present

## 2020-11-23 DIAGNOSIS — M1991 Primary osteoarthritis, unspecified site: Secondary | ICD-10-CM | POA: Diagnosis not present

## 2020-11-23 DIAGNOSIS — N390 Urinary tract infection, site not specified: Secondary | ICD-10-CM | POA: Diagnosis not present

## 2020-11-23 DIAGNOSIS — G894 Chronic pain syndrome: Secondary | ICD-10-CM | POA: Diagnosis not present

## 2020-11-23 DIAGNOSIS — Z79891 Long term (current) use of opiate analgesic: Secondary | ICD-10-CM | POA: Diagnosis not present

## 2020-11-23 DIAGNOSIS — M21371 Foot drop, right foot: Secondary | ICD-10-CM | POA: Diagnosis not present

## 2020-11-23 DIAGNOSIS — R338 Other retention of urine: Secondary | ICD-10-CM | POA: Diagnosis not present

## 2020-11-23 DIAGNOSIS — Z87442 Personal history of urinary calculi: Secondary | ICD-10-CM | POA: Diagnosis not present

## 2020-11-23 DIAGNOSIS — E782 Mixed hyperlipidemia: Secondary | ICD-10-CM | POA: Diagnosis not present

## 2020-11-23 DIAGNOSIS — I10 Essential (primary) hypertension: Secondary | ICD-10-CM | POA: Diagnosis not present

## 2020-11-23 DIAGNOSIS — G2581 Restless legs syndrome: Secondary | ICD-10-CM | POA: Diagnosis not present

## 2020-11-23 DIAGNOSIS — I700 Atherosclerosis of aorta: Secondary | ICD-10-CM | POA: Diagnosis not present

## 2020-11-23 DIAGNOSIS — G4730 Sleep apnea, unspecified: Secondary | ICD-10-CM | POA: Diagnosis not present

## 2020-11-23 DIAGNOSIS — Z466 Encounter for fitting and adjustment of urinary device: Secondary | ICD-10-CM | POA: Diagnosis not present

## 2020-11-23 DIAGNOSIS — N3281 Overactive bladder: Secondary | ICD-10-CM | POA: Diagnosis not present

## 2020-11-23 DIAGNOSIS — Z9181 History of falling: Secondary | ICD-10-CM | POA: Diagnosis not present

## 2020-11-23 DIAGNOSIS — G629 Polyneuropathy, unspecified: Secondary | ICD-10-CM | POA: Diagnosis not present

## 2020-11-26 DIAGNOSIS — G2581 Restless legs syndrome: Secondary | ICD-10-CM | POA: Diagnosis not present

## 2020-11-26 DIAGNOSIS — N3281 Overactive bladder: Secondary | ICD-10-CM | POA: Diagnosis not present

## 2020-11-26 DIAGNOSIS — G4730 Sleep apnea, unspecified: Secondary | ICD-10-CM | POA: Diagnosis not present

## 2020-11-26 DIAGNOSIS — G894 Chronic pain syndrome: Secondary | ICD-10-CM | POA: Diagnosis not present

## 2020-11-26 DIAGNOSIS — G629 Polyneuropathy, unspecified: Secondary | ICD-10-CM | POA: Diagnosis not present

## 2020-11-26 DIAGNOSIS — I700 Atherosclerosis of aorta: Secondary | ICD-10-CM | POA: Diagnosis not present

## 2020-11-26 DIAGNOSIS — Z87442 Personal history of urinary calculi: Secondary | ICD-10-CM | POA: Diagnosis not present

## 2020-11-26 DIAGNOSIS — M21371 Foot drop, right foot: Secondary | ICD-10-CM | POA: Diagnosis not present

## 2020-11-26 DIAGNOSIS — Z9181 History of falling: Secondary | ICD-10-CM | POA: Diagnosis not present

## 2020-11-26 DIAGNOSIS — E782 Mixed hyperlipidemia: Secondary | ICD-10-CM | POA: Diagnosis not present

## 2020-11-26 DIAGNOSIS — R3914 Feeling of incomplete bladder emptying: Secondary | ICD-10-CM | POA: Diagnosis not present

## 2020-11-26 DIAGNOSIS — R338 Other retention of urine: Secondary | ICD-10-CM | POA: Diagnosis not present

## 2020-11-26 DIAGNOSIS — M1991 Primary osteoarthritis, unspecified site: Secondary | ICD-10-CM | POA: Diagnosis not present

## 2020-11-26 DIAGNOSIS — Z466 Encounter for fitting and adjustment of urinary device: Secondary | ICD-10-CM | POA: Diagnosis not present

## 2020-11-26 DIAGNOSIS — Z79891 Long term (current) use of opiate analgesic: Secondary | ICD-10-CM | POA: Diagnosis not present

## 2020-11-26 DIAGNOSIS — N390 Urinary tract infection, site not specified: Secondary | ICD-10-CM | POA: Diagnosis not present

## 2020-11-26 DIAGNOSIS — I10 Essential (primary) hypertension: Secondary | ICD-10-CM | POA: Diagnosis not present

## 2020-11-28 DIAGNOSIS — Z466 Encounter for fitting and adjustment of urinary device: Secondary | ICD-10-CM | POA: Diagnosis not present

## 2020-11-28 DIAGNOSIS — G4730 Sleep apnea, unspecified: Secondary | ICD-10-CM | POA: Diagnosis not present

## 2020-11-28 DIAGNOSIS — N3281 Overactive bladder: Secondary | ICD-10-CM | POA: Diagnosis not present

## 2020-11-28 DIAGNOSIS — I700 Atherosclerosis of aorta: Secondary | ICD-10-CM | POA: Diagnosis not present

## 2020-11-28 DIAGNOSIS — G2581 Restless legs syndrome: Secondary | ICD-10-CM | POA: Diagnosis not present

## 2020-11-28 DIAGNOSIS — N390 Urinary tract infection, site not specified: Secondary | ICD-10-CM | POA: Diagnosis not present

## 2020-11-28 DIAGNOSIS — I10 Essential (primary) hypertension: Secondary | ICD-10-CM | POA: Diagnosis not present

## 2020-11-28 DIAGNOSIS — M1991 Primary osteoarthritis, unspecified site: Secondary | ICD-10-CM | POA: Diagnosis not present

## 2020-11-28 DIAGNOSIS — E782 Mixed hyperlipidemia: Secondary | ICD-10-CM | POA: Diagnosis not present

## 2020-11-28 DIAGNOSIS — G894 Chronic pain syndrome: Secondary | ICD-10-CM | POA: Diagnosis not present

## 2020-11-28 DIAGNOSIS — Z9181 History of falling: Secondary | ICD-10-CM | POA: Diagnosis not present

## 2020-11-28 DIAGNOSIS — Z87442 Personal history of urinary calculi: Secondary | ICD-10-CM | POA: Diagnosis not present

## 2020-11-28 DIAGNOSIS — G629 Polyneuropathy, unspecified: Secondary | ICD-10-CM | POA: Diagnosis not present

## 2020-11-28 DIAGNOSIS — Z79891 Long term (current) use of opiate analgesic: Secondary | ICD-10-CM | POA: Diagnosis not present

## 2020-11-28 DIAGNOSIS — R338 Other retention of urine: Secondary | ICD-10-CM | POA: Diagnosis not present

## 2020-11-28 DIAGNOSIS — M21371 Foot drop, right foot: Secondary | ICD-10-CM | POA: Diagnosis not present

## 2020-11-30 DIAGNOSIS — M21371 Foot drop, right foot: Secondary | ICD-10-CM | POA: Diagnosis not present

## 2020-11-30 DIAGNOSIS — Z7409 Other reduced mobility: Secondary | ICD-10-CM | POA: Diagnosis not present

## 2020-11-30 DIAGNOSIS — R0601 Orthopnea: Secondary | ICD-10-CM | POA: Diagnosis not present

## 2020-11-30 DIAGNOSIS — G894 Chronic pain syndrome: Secondary | ICD-10-CM | POA: Diagnosis not present

## 2020-12-03 DIAGNOSIS — N3281 Overactive bladder: Secondary | ICD-10-CM | POA: Diagnosis not present

## 2020-12-03 DIAGNOSIS — I700 Atherosclerosis of aorta: Secondary | ICD-10-CM | POA: Diagnosis not present

## 2020-12-03 DIAGNOSIS — N390 Urinary tract infection, site not specified: Secondary | ICD-10-CM | POA: Diagnosis not present

## 2020-12-03 DIAGNOSIS — R338 Other retention of urine: Secondary | ICD-10-CM | POA: Diagnosis not present

## 2020-12-03 DIAGNOSIS — Z79891 Long term (current) use of opiate analgesic: Secondary | ICD-10-CM | POA: Diagnosis not present

## 2020-12-03 DIAGNOSIS — M1991 Primary osteoarthritis, unspecified site: Secondary | ICD-10-CM | POA: Diagnosis not present

## 2020-12-03 DIAGNOSIS — Z9181 History of falling: Secondary | ICD-10-CM | POA: Diagnosis not present

## 2020-12-03 DIAGNOSIS — Z466 Encounter for fitting and adjustment of urinary device: Secondary | ICD-10-CM | POA: Diagnosis not present

## 2020-12-03 DIAGNOSIS — G2581 Restless legs syndrome: Secondary | ICD-10-CM | POA: Diagnosis not present

## 2020-12-03 DIAGNOSIS — G894 Chronic pain syndrome: Secondary | ICD-10-CM | POA: Diagnosis not present

## 2020-12-03 DIAGNOSIS — I10 Essential (primary) hypertension: Secondary | ICD-10-CM | POA: Diagnosis not present

## 2020-12-03 DIAGNOSIS — G4730 Sleep apnea, unspecified: Secondary | ICD-10-CM | POA: Diagnosis not present

## 2020-12-03 DIAGNOSIS — M21371 Foot drop, right foot: Secondary | ICD-10-CM | POA: Diagnosis not present

## 2020-12-03 DIAGNOSIS — Z87442 Personal history of urinary calculi: Secondary | ICD-10-CM | POA: Diagnosis not present

## 2020-12-03 DIAGNOSIS — E782 Mixed hyperlipidemia: Secondary | ICD-10-CM | POA: Diagnosis not present

## 2020-12-03 DIAGNOSIS — G629 Polyneuropathy, unspecified: Secondary | ICD-10-CM | POA: Diagnosis not present

## 2020-12-04 DIAGNOSIS — I1310 Hypertensive heart and chronic kidney disease without heart failure, with stage 1 through stage 4 chronic kidney disease, or unspecified chronic kidney disease: Secondary | ICD-10-CM | POA: Diagnosis not present

## 2020-12-04 DIAGNOSIS — F32A Depression, unspecified: Secondary | ICD-10-CM | POA: Diagnosis not present

## 2020-12-04 DIAGNOSIS — G4730 Sleep apnea, unspecified: Secondary | ICD-10-CM | POA: Diagnosis not present

## 2020-12-04 DIAGNOSIS — N182 Chronic kidney disease, stage 2 (mild): Secondary | ICD-10-CM | POA: Diagnosis not present

## 2020-12-04 DIAGNOSIS — Q6102 Congenital multiple renal cysts: Secondary | ICD-10-CM | POA: Diagnosis not present

## 2020-12-04 DIAGNOSIS — M1990 Unspecified osteoarthritis, unspecified site: Secondary | ICD-10-CM | POA: Diagnosis not present

## 2020-12-04 DIAGNOSIS — N390 Urinary tract infection, site not specified: Secondary | ICD-10-CM | POA: Diagnosis not present

## 2020-12-04 DIAGNOSIS — R350 Frequency of micturition: Secondary | ICD-10-CM | POA: Diagnosis not present

## 2020-12-04 DIAGNOSIS — Z9181 History of falling: Secondary | ICD-10-CM | POA: Diagnosis not present

## 2020-12-04 DIAGNOSIS — G894 Chronic pain syndrome: Secondary | ICD-10-CM | POA: Diagnosis not present

## 2020-12-04 DIAGNOSIS — Z792 Long term (current) use of antibiotics: Secondary | ICD-10-CM | POA: Diagnosis not present

## 2020-12-04 DIAGNOSIS — M6281 Muscle weakness (generalized): Secondary | ICD-10-CM | POA: Diagnosis not present

## 2020-12-04 DIAGNOSIS — Z87442 Personal history of urinary calculi: Secondary | ICD-10-CM | POA: Diagnosis not present

## 2020-12-04 DIAGNOSIS — M5136 Other intervertebral disc degeneration, lumbar region: Secondary | ICD-10-CM | POA: Diagnosis not present

## 2020-12-04 DIAGNOSIS — Z87891 Personal history of nicotine dependence: Secondary | ICD-10-CM | POA: Diagnosis not present

## 2020-12-04 DIAGNOSIS — M479 Spondylosis, unspecified: Secondary | ICD-10-CM | POA: Diagnosis not present

## 2020-12-04 DIAGNOSIS — E7800 Pure hypercholesterolemia, unspecified: Secondary | ICD-10-CM | POA: Diagnosis not present

## 2020-12-04 DIAGNOSIS — R338 Other retention of urine: Secondary | ICD-10-CM | POA: Diagnosis not present

## 2020-12-06 DIAGNOSIS — M1990 Unspecified osteoarthritis, unspecified site: Secondary | ICD-10-CM | POA: Diagnosis not present

## 2020-12-06 DIAGNOSIS — G894 Chronic pain syndrome: Secondary | ICD-10-CM | POA: Diagnosis not present

## 2020-12-06 DIAGNOSIS — G4730 Sleep apnea, unspecified: Secondary | ICD-10-CM | POA: Diagnosis not present

## 2020-12-06 DIAGNOSIS — Z792 Long term (current) use of antibiotics: Secondary | ICD-10-CM | POA: Diagnosis not present

## 2020-12-06 DIAGNOSIS — Z9181 History of falling: Secondary | ICD-10-CM | POA: Diagnosis not present

## 2020-12-06 DIAGNOSIS — Z87442 Personal history of urinary calculi: Secondary | ICD-10-CM | POA: Diagnosis not present

## 2020-12-06 DIAGNOSIS — R350 Frequency of micturition: Secondary | ICD-10-CM | POA: Diagnosis not present

## 2020-12-06 DIAGNOSIS — N182 Chronic kidney disease, stage 2 (mild): Secondary | ICD-10-CM | POA: Diagnosis not present

## 2020-12-06 DIAGNOSIS — Q6102 Congenital multiple renal cysts: Secondary | ICD-10-CM | POA: Diagnosis not present

## 2020-12-06 DIAGNOSIS — N390 Urinary tract infection, site not specified: Secondary | ICD-10-CM | POA: Diagnosis not present

## 2020-12-06 DIAGNOSIS — I1310 Hypertensive heart and chronic kidney disease without heart failure, with stage 1 through stage 4 chronic kidney disease, or unspecified chronic kidney disease: Secondary | ICD-10-CM | POA: Diagnosis not present

## 2020-12-06 DIAGNOSIS — Z87891 Personal history of nicotine dependence: Secondary | ICD-10-CM | POA: Diagnosis not present

## 2020-12-06 DIAGNOSIS — F32A Depression, unspecified: Secondary | ICD-10-CM | POA: Diagnosis not present

## 2020-12-06 DIAGNOSIS — M6281 Muscle weakness (generalized): Secondary | ICD-10-CM | POA: Diagnosis not present

## 2020-12-06 DIAGNOSIS — M5136 Other intervertebral disc degeneration, lumbar region: Secondary | ICD-10-CM | POA: Diagnosis not present

## 2020-12-06 DIAGNOSIS — M479 Spondylosis, unspecified: Secondary | ICD-10-CM | POA: Diagnosis not present

## 2020-12-06 DIAGNOSIS — E7800 Pure hypercholesterolemia, unspecified: Secondary | ICD-10-CM | POA: Diagnosis not present

## 2020-12-06 DIAGNOSIS — R338 Other retention of urine: Secondary | ICD-10-CM | POA: Diagnosis not present

## 2020-12-09 DIAGNOSIS — N182 Chronic kidney disease, stage 2 (mild): Secondary | ICD-10-CM | POA: Diagnosis not present

## 2020-12-09 DIAGNOSIS — N390 Urinary tract infection, site not specified: Secondary | ICD-10-CM | POA: Diagnosis not present

## 2020-12-09 DIAGNOSIS — M5136 Other intervertebral disc degeneration, lumbar region: Secondary | ICD-10-CM | POA: Diagnosis not present

## 2020-12-09 DIAGNOSIS — M479 Spondylosis, unspecified: Secondary | ICD-10-CM | POA: Diagnosis not present

## 2020-12-09 DIAGNOSIS — G4730 Sleep apnea, unspecified: Secondary | ICD-10-CM | POA: Diagnosis not present

## 2020-12-09 DIAGNOSIS — I1310 Hypertensive heart and chronic kidney disease without heart failure, with stage 1 through stage 4 chronic kidney disease, or unspecified chronic kidney disease: Secondary | ICD-10-CM | POA: Diagnosis not present

## 2020-12-09 DIAGNOSIS — Z792 Long term (current) use of antibiotics: Secondary | ICD-10-CM | POA: Diagnosis not present

## 2020-12-09 DIAGNOSIS — Z9181 History of falling: Secondary | ICD-10-CM | POA: Diagnosis not present

## 2020-12-09 DIAGNOSIS — G894 Chronic pain syndrome: Secondary | ICD-10-CM | POA: Diagnosis not present

## 2020-12-09 DIAGNOSIS — R338 Other retention of urine: Secondary | ICD-10-CM | POA: Diagnosis not present

## 2020-12-09 DIAGNOSIS — Z87442 Personal history of urinary calculi: Secondary | ICD-10-CM | POA: Diagnosis not present

## 2020-12-09 DIAGNOSIS — E7800 Pure hypercholesterolemia, unspecified: Secondary | ICD-10-CM | POA: Diagnosis not present

## 2020-12-09 DIAGNOSIS — F32A Depression, unspecified: Secondary | ICD-10-CM | POA: Diagnosis not present

## 2020-12-09 DIAGNOSIS — Z87891 Personal history of nicotine dependence: Secondary | ICD-10-CM | POA: Diagnosis not present

## 2020-12-09 DIAGNOSIS — M6281 Muscle weakness (generalized): Secondary | ICD-10-CM | POA: Diagnosis not present

## 2020-12-09 DIAGNOSIS — Q6102 Congenital multiple renal cysts: Secondary | ICD-10-CM | POA: Diagnosis not present

## 2020-12-09 DIAGNOSIS — R350 Frequency of micturition: Secondary | ICD-10-CM | POA: Diagnosis not present

## 2020-12-09 DIAGNOSIS — M1990 Unspecified osteoarthritis, unspecified site: Secondary | ICD-10-CM | POA: Diagnosis not present

## 2020-12-11 DIAGNOSIS — Q6102 Congenital multiple renal cysts: Secondary | ICD-10-CM | POA: Diagnosis not present

## 2020-12-11 DIAGNOSIS — R350 Frequency of micturition: Secondary | ICD-10-CM | POA: Diagnosis not present

## 2020-12-11 DIAGNOSIS — G4730 Sleep apnea, unspecified: Secondary | ICD-10-CM | POA: Diagnosis not present

## 2020-12-11 DIAGNOSIS — Z87891 Personal history of nicotine dependence: Secondary | ICD-10-CM | POA: Diagnosis not present

## 2020-12-11 DIAGNOSIS — N182 Chronic kidney disease, stage 2 (mild): Secondary | ICD-10-CM | POA: Diagnosis not present

## 2020-12-11 DIAGNOSIS — M1990 Unspecified osteoarthritis, unspecified site: Secondary | ICD-10-CM | POA: Diagnosis not present

## 2020-12-11 DIAGNOSIS — N390 Urinary tract infection, site not specified: Secondary | ICD-10-CM | POA: Diagnosis not present

## 2020-12-11 DIAGNOSIS — M6281 Muscle weakness (generalized): Secondary | ICD-10-CM | POA: Diagnosis not present

## 2020-12-11 DIAGNOSIS — E7800 Pure hypercholesterolemia, unspecified: Secondary | ICD-10-CM | POA: Diagnosis not present

## 2020-12-11 DIAGNOSIS — I1310 Hypertensive heart and chronic kidney disease without heart failure, with stage 1 through stage 4 chronic kidney disease, or unspecified chronic kidney disease: Secondary | ICD-10-CM | POA: Diagnosis not present

## 2020-12-11 DIAGNOSIS — M5136 Other intervertebral disc degeneration, lumbar region: Secondary | ICD-10-CM | POA: Diagnosis not present

## 2020-12-11 DIAGNOSIS — Z87442 Personal history of urinary calculi: Secondary | ICD-10-CM | POA: Diagnosis not present

## 2020-12-11 DIAGNOSIS — M479 Spondylosis, unspecified: Secondary | ICD-10-CM | POA: Diagnosis not present

## 2020-12-11 DIAGNOSIS — G894 Chronic pain syndrome: Secondary | ICD-10-CM | POA: Diagnosis not present

## 2020-12-11 DIAGNOSIS — F32A Depression, unspecified: Secondary | ICD-10-CM | POA: Diagnosis not present

## 2020-12-11 DIAGNOSIS — R338 Other retention of urine: Secondary | ICD-10-CM | POA: Diagnosis not present

## 2020-12-11 DIAGNOSIS — Z9181 History of falling: Secondary | ICD-10-CM | POA: Diagnosis not present

## 2020-12-11 DIAGNOSIS — Z792 Long term (current) use of antibiotics: Secondary | ICD-10-CM | POA: Diagnosis not present

## 2020-12-12 DIAGNOSIS — G894 Chronic pain syndrome: Secondary | ICD-10-CM | POA: Diagnosis not present

## 2020-12-12 DIAGNOSIS — R338 Other retention of urine: Secondary | ICD-10-CM | POA: Diagnosis not present

## 2020-12-12 DIAGNOSIS — M6281 Muscle weakness (generalized): Secondary | ICD-10-CM | POA: Diagnosis not present

## 2020-12-12 DIAGNOSIS — Z87891 Personal history of nicotine dependence: Secondary | ICD-10-CM | POA: Diagnosis not present

## 2020-12-12 DIAGNOSIS — F32A Depression, unspecified: Secondary | ICD-10-CM | POA: Diagnosis not present

## 2020-12-12 DIAGNOSIS — M1990 Unspecified osteoarthritis, unspecified site: Secondary | ICD-10-CM | POA: Diagnosis not present

## 2020-12-12 DIAGNOSIS — R350 Frequency of micturition: Secondary | ICD-10-CM | POA: Diagnosis not present

## 2020-12-12 DIAGNOSIS — I1310 Hypertensive heart and chronic kidney disease without heart failure, with stage 1 through stage 4 chronic kidney disease, or unspecified chronic kidney disease: Secondary | ICD-10-CM | POA: Diagnosis not present

## 2020-12-12 DIAGNOSIS — Z87442 Personal history of urinary calculi: Secondary | ICD-10-CM | POA: Diagnosis not present

## 2020-12-12 DIAGNOSIS — E7800 Pure hypercholesterolemia, unspecified: Secondary | ICD-10-CM | POA: Diagnosis not present

## 2020-12-12 DIAGNOSIS — N390 Urinary tract infection, site not specified: Secondary | ICD-10-CM | POA: Diagnosis not present

## 2020-12-12 DIAGNOSIS — Q6102 Congenital multiple renal cysts: Secondary | ICD-10-CM | POA: Diagnosis not present

## 2020-12-12 DIAGNOSIS — Z792 Long term (current) use of antibiotics: Secondary | ICD-10-CM | POA: Diagnosis not present

## 2020-12-12 DIAGNOSIS — Z9181 History of falling: Secondary | ICD-10-CM | POA: Diagnosis not present

## 2020-12-12 DIAGNOSIS — M5136 Other intervertebral disc degeneration, lumbar region: Secondary | ICD-10-CM | POA: Diagnosis not present

## 2020-12-12 DIAGNOSIS — G4730 Sleep apnea, unspecified: Secondary | ICD-10-CM | POA: Diagnosis not present

## 2020-12-12 DIAGNOSIS — M479 Spondylosis, unspecified: Secondary | ICD-10-CM | POA: Diagnosis not present

## 2020-12-12 DIAGNOSIS — N182 Chronic kidney disease, stage 2 (mild): Secondary | ICD-10-CM | POA: Diagnosis not present

## 2020-12-13 DIAGNOSIS — M5136 Other intervertebral disc degeneration, lumbar region: Secondary | ICD-10-CM | POA: Diagnosis not present

## 2020-12-13 DIAGNOSIS — N182 Chronic kidney disease, stage 2 (mild): Secondary | ICD-10-CM | POA: Diagnosis not present

## 2020-12-13 DIAGNOSIS — M1990 Unspecified osteoarthritis, unspecified site: Secondary | ICD-10-CM | POA: Diagnosis not present

## 2020-12-13 DIAGNOSIS — G4730 Sleep apnea, unspecified: Secondary | ICD-10-CM | POA: Diagnosis not present

## 2020-12-13 DIAGNOSIS — N390 Urinary tract infection, site not specified: Secondary | ICD-10-CM | POA: Diagnosis not present

## 2020-12-13 DIAGNOSIS — Z792 Long term (current) use of antibiotics: Secondary | ICD-10-CM | POA: Diagnosis not present

## 2020-12-13 DIAGNOSIS — Q6102 Congenital multiple renal cysts: Secondary | ICD-10-CM | POA: Diagnosis not present

## 2020-12-13 DIAGNOSIS — G894 Chronic pain syndrome: Secondary | ICD-10-CM | POA: Diagnosis not present

## 2020-12-13 DIAGNOSIS — Z87891 Personal history of nicotine dependence: Secondary | ICD-10-CM | POA: Diagnosis not present

## 2020-12-13 DIAGNOSIS — R338 Other retention of urine: Secondary | ICD-10-CM | POA: Diagnosis not present

## 2020-12-13 DIAGNOSIS — I1310 Hypertensive heart and chronic kidney disease without heart failure, with stage 1 through stage 4 chronic kidney disease, or unspecified chronic kidney disease: Secondary | ICD-10-CM | POA: Diagnosis not present

## 2020-12-13 DIAGNOSIS — E7800 Pure hypercholesterolemia, unspecified: Secondary | ICD-10-CM | POA: Diagnosis not present

## 2020-12-13 DIAGNOSIS — M6281 Muscle weakness (generalized): Secondary | ICD-10-CM | POA: Diagnosis not present

## 2020-12-13 DIAGNOSIS — Z9181 History of falling: Secondary | ICD-10-CM | POA: Diagnosis not present

## 2020-12-13 DIAGNOSIS — Z87442 Personal history of urinary calculi: Secondary | ICD-10-CM | POA: Diagnosis not present

## 2020-12-13 DIAGNOSIS — R350 Frequency of micturition: Secondary | ICD-10-CM | POA: Diagnosis not present

## 2020-12-13 DIAGNOSIS — M479 Spondylosis, unspecified: Secondary | ICD-10-CM | POA: Diagnosis not present

## 2020-12-13 DIAGNOSIS — F32A Depression, unspecified: Secondary | ICD-10-CM | POA: Diagnosis not present

## 2020-12-16 DIAGNOSIS — Z792 Long term (current) use of antibiotics: Secondary | ICD-10-CM | POA: Diagnosis not present

## 2020-12-16 DIAGNOSIS — Z87891 Personal history of nicotine dependence: Secondary | ICD-10-CM | POA: Diagnosis not present

## 2020-12-16 DIAGNOSIS — I1310 Hypertensive heart and chronic kidney disease without heart failure, with stage 1 through stage 4 chronic kidney disease, or unspecified chronic kidney disease: Secondary | ICD-10-CM | POA: Diagnosis not present

## 2020-12-16 DIAGNOSIS — G894 Chronic pain syndrome: Secondary | ICD-10-CM | POA: Diagnosis not present

## 2020-12-16 DIAGNOSIS — M5136 Other intervertebral disc degeneration, lumbar region: Secondary | ICD-10-CM | POA: Diagnosis not present

## 2020-12-16 DIAGNOSIS — Z87442 Personal history of urinary calculi: Secondary | ICD-10-CM | POA: Diagnosis not present

## 2020-12-16 DIAGNOSIS — E7800 Pure hypercholesterolemia, unspecified: Secondary | ICD-10-CM | POA: Diagnosis not present

## 2020-12-16 DIAGNOSIS — M1990 Unspecified osteoarthritis, unspecified site: Secondary | ICD-10-CM | POA: Diagnosis not present

## 2020-12-16 DIAGNOSIS — M479 Spondylosis, unspecified: Secondary | ICD-10-CM | POA: Diagnosis not present

## 2020-12-16 DIAGNOSIS — N390 Urinary tract infection, site not specified: Secondary | ICD-10-CM | POA: Diagnosis not present

## 2020-12-16 DIAGNOSIS — N182 Chronic kidney disease, stage 2 (mild): Secondary | ICD-10-CM | POA: Diagnosis not present

## 2020-12-16 DIAGNOSIS — R350 Frequency of micturition: Secondary | ICD-10-CM | POA: Diagnosis not present

## 2020-12-16 DIAGNOSIS — F32A Depression, unspecified: Secondary | ICD-10-CM | POA: Diagnosis not present

## 2020-12-16 DIAGNOSIS — Z9181 History of falling: Secondary | ICD-10-CM | POA: Diagnosis not present

## 2020-12-16 DIAGNOSIS — G4730 Sleep apnea, unspecified: Secondary | ICD-10-CM | POA: Diagnosis not present

## 2020-12-16 DIAGNOSIS — M6281 Muscle weakness (generalized): Secondary | ICD-10-CM | POA: Diagnosis not present

## 2020-12-16 DIAGNOSIS — Q6102 Congenital multiple renal cysts: Secondary | ICD-10-CM | POA: Diagnosis not present

## 2020-12-16 DIAGNOSIS — R338 Other retention of urine: Secondary | ICD-10-CM | POA: Diagnosis not present

## 2020-12-17 DIAGNOSIS — M6281 Muscle weakness (generalized): Secondary | ICD-10-CM | POA: Diagnosis not present

## 2020-12-17 DIAGNOSIS — R350 Frequency of micturition: Secondary | ICD-10-CM | POA: Diagnosis not present

## 2020-12-17 DIAGNOSIS — G4730 Sleep apnea, unspecified: Secondary | ICD-10-CM | POA: Diagnosis not present

## 2020-12-17 DIAGNOSIS — N390 Urinary tract infection, site not specified: Secondary | ICD-10-CM | POA: Diagnosis not present

## 2020-12-17 DIAGNOSIS — R338 Other retention of urine: Secondary | ICD-10-CM | POA: Diagnosis not present

## 2020-12-17 DIAGNOSIS — Q6102 Congenital multiple renal cysts: Secondary | ICD-10-CM | POA: Diagnosis not present

## 2020-12-17 DIAGNOSIS — N182 Chronic kidney disease, stage 2 (mild): Secondary | ICD-10-CM | POA: Diagnosis not present

## 2020-12-17 DIAGNOSIS — M1990 Unspecified osteoarthritis, unspecified site: Secondary | ICD-10-CM | POA: Diagnosis not present

## 2020-12-17 DIAGNOSIS — F32A Depression, unspecified: Secondary | ICD-10-CM | POA: Diagnosis not present

## 2020-12-17 DIAGNOSIS — M5136 Other intervertebral disc degeneration, lumbar region: Secondary | ICD-10-CM | POA: Diagnosis not present

## 2020-12-17 DIAGNOSIS — M479 Spondylosis, unspecified: Secondary | ICD-10-CM | POA: Diagnosis not present

## 2020-12-17 DIAGNOSIS — Z87891 Personal history of nicotine dependence: Secondary | ICD-10-CM | POA: Diagnosis not present

## 2020-12-17 DIAGNOSIS — E7800 Pure hypercholesterolemia, unspecified: Secondary | ICD-10-CM | POA: Diagnosis not present

## 2020-12-17 DIAGNOSIS — Z87442 Personal history of urinary calculi: Secondary | ICD-10-CM | POA: Diagnosis not present

## 2020-12-17 DIAGNOSIS — Z9181 History of falling: Secondary | ICD-10-CM | POA: Diagnosis not present

## 2020-12-17 DIAGNOSIS — Z792 Long term (current) use of antibiotics: Secondary | ICD-10-CM | POA: Diagnosis not present

## 2020-12-17 DIAGNOSIS — I1310 Hypertensive heart and chronic kidney disease without heart failure, with stage 1 through stage 4 chronic kidney disease, or unspecified chronic kidney disease: Secondary | ICD-10-CM | POA: Diagnosis not present

## 2020-12-17 DIAGNOSIS — G894 Chronic pain syndrome: Secondary | ICD-10-CM | POA: Diagnosis not present

## 2020-12-18 DIAGNOSIS — F32A Depression, unspecified: Secondary | ICD-10-CM | POA: Diagnosis not present

## 2020-12-18 DIAGNOSIS — Z9181 History of falling: Secondary | ICD-10-CM | POA: Diagnosis not present

## 2020-12-18 DIAGNOSIS — Z87891 Personal history of nicotine dependence: Secondary | ICD-10-CM | POA: Diagnosis not present

## 2020-12-18 DIAGNOSIS — R350 Frequency of micturition: Secondary | ICD-10-CM | POA: Diagnosis not present

## 2020-12-18 DIAGNOSIS — M5136 Other intervertebral disc degeneration, lumbar region: Secondary | ICD-10-CM | POA: Diagnosis not present

## 2020-12-18 DIAGNOSIS — E7800 Pure hypercholesterolemia, unspecified: Secondary | ICD-10-CM | POA: Diagnosis not present

## 2020-12-18 DIAGNOSIS — Z792 Long term (current) use of antibiotics: Secondary | ICD-10-CM | POA: Diagnosis not present

## 2020-12-18 DIAGNOSIS — G4730 Sleep apnea, unspecified: Secondary | ICD-10-CM | POA: Diagnosis not present

## 2020-12-18 DIAGNOSIS — I1310 Hypertensive heart and chronic kidney disease without heart failure, with stage 1 through stage 4 chronic kidney disease, or unspecified chronic kidney disease: Secondary | ICD-10-CM | POA: Diagnosis not present

## 2020-12-18 DIAGNOSIS — G894 Chronic pain syndrome: Secondary | ICD-10-CM | POA: Diagnosis not present

## 2020-12-18 DIAGNOSIS — N390 Urinary tract infection, site not specified: Secondary | ICD-10-CM | POA: Diagnosis not present

## 2020-12-18 DIAGNOSIS — R338 Other retention of urine: Secondary | ICD-10-CM | POA: Diagnosis not present

## 2020-12-18 DIAGNOSIS — N182 Chronic kidney disease, stage 2 (mild): Secondary | ICD-10-CM | POA: Diagnosis not present

## 2020-12-18 DIAGNOSIS — M479 Spondylosis, unspecified: Secondary | ICD-10-CM | POA: Diagnosis not present

## 2020-12-18 DIAGNOSIS — M6281 Muscle weakness (generalized): Secondary | ICD-10-CM | POA: Diagnosis not present

## 2020-12-18 DIAGNOSIS — Q6102 Congenital multiple renal cysts: Secondary | ICD-10-CM | POA: Diagnosis not present

## 2020-12-18 DIAGNOSIS — Z87442 Personal history of urinary calculi: Secondary | ICD-10-CM | POA: Diagnosis not present

## 2020-12-18 DIAGNOSIS — M1990 Unspecified osteoarthritis, unspecified site: Secondary | ICD-10-CM | POA: Diagnosis not present

## 2020-12-19 DIAGNOSIS — R338 Other retention of urine: Secondary | ICD-10-CM | POA: Diagnosis not present

## 2020-12-19 DIAGNOSIS — G894 Chronic pain syndrome: Secondary | ICD-10-CM | POA: Diagnosis not present

## 2020-12-19 DIAGNOSIS — G4730 Sleep apnea, unspecified: Secondary | ICD-10-CM | POA: Diagnosis not present

## 2020-12-19 DIAGNOSIS — M6281 Muscle weakness (generalized): Secondary | ICD-10-CM | POA: Diagnosis not present

## 2020-12-19 DIAGNOSIS — M479 Spondylosis, unspecified: Secondary | ICD-10-CM | POA: Diagnosis not present

## 2020-12-19 DIAGNOSIS — M1990 Unspecified osteoarthritis, unspecified site: Secondary | ICD-10-CM | POA: Diagnosis not present

## 2020-12-19 DIAGNOSIS — N182 Chronic kidney disease, stage 2 (mild): Secondary | ICD-10-CM | POA: Diagnosis not present

## 2020-12-19 DIAGNOSIS — Z87891 Personal history of nicotine dependence: Secondary | ICD-10-CM | POA: Diagnosis not present

## 2020-12-19 DIAGNOSIS — Q6102 Congenital multiple renal cysts: Secondary | ICD-10-CM | POA: Diagnosis not present

## 2020-12-19 DIAGNOSIS — Z792 Long term (current) use of antibiotics: Secondary | ICD-10-CM | POA: Diagnosis not present

## 2020-12-19 DIAGNOSIS — R350 Frequency of micturition: Secondary | ICD-10-CM | POA: Diagnosis not present

## 2020-12-19 DIAGNOSIS — Z9181 History of falling: Secondary | ICD-10-CM | POA: Diagnosis not present

## 2020-12-19 DIAGNOSIS — E7800 Pure hypercholesterolemia, unspecified: Secondary | ICD-10-CM | POA: Diagnosis not present

## 2020-12-19 DIAGNOSIS — Z87442 Personal history of urinary calculi: Secondary | ICD-10-CM | POA: Diagnosis not present

## 2020-12-19 DIAGNOSIS — N390 Urinary tract infection, site not specified: Secondary | ICD-10-CM | POA: Diagnosis not present

## 2020-12-19 DIAGNOSIS — F32A Depression, unspecified: Secondary | ICD-10-CM | POA: Diagnosis not present

## 2020-12-19 DIAGNOSIS — M5136 Other intervertebral disc degeneration, lumbar region: Secondary | ICD-10-CM | POA: Diagnosis not present

## 2020-12-19 DIAGNOSIS — I1310 Hypertensive heart and chronic kidney disease without heart failure, with stage 1 through stage 4 chronic kidney disease, or unspecified chronic kidney disease: Secondary | ICD-10-CM | POA: Diagnosis not present

## 2020-12-23 ENCOUNTER — Other Ambulatory Visit: Payer: Self-pay | Admitting: Adult Health

## 2020-12-23 DIAGNOSIS — R338 Other retention of urine: Secondary | ICD-10-CM | POA: Diagnosis not present

## 2020-12-23 DIAGNOSIS — N390 Urinary tract infection, site not specified: Secondary | ICD-10-CM | POA: Diagnosis not present

## 2020-12-23 DIAGNOSIS — R350 Frequency of micturition: Secondary | ICD-10-CM | POA: Diagnosis not present

## 2020-12-23 DIAGNOSIS — F32A Depression, unspecified: Secondary | ICD-10-CM | POA: Diagnosis not present

## 2020-12-23 DIAGNOSIS — M5136 Other intervertebral disc degeneration, lumbar region: Secondary | ICD-10-CM | POA: Diagnosis not present

## 2020-12-23 DIAGNOSIS — G894 Chronic pain syndrome: Secondary | ICD-10-CM | POA: Diagnosis not present

## 2020-12-23 DIAGNOSIS — M479 Spondylosis, unspecified: Secondary | ICD-10-CM | POA: Diagnosis not present

## 2020-12-23 DIAGNOSIS — N182 Chronic kidney disease, stage 2 (mild): Secondary | ICD-10-CM | POA: Diagnosis not present

## 2020-12-23 DIAGNOSIS — F329 Major depressive disorder, single episode, unspecified: Secondary | ICD-10-CM

## 2020-12-23 DIAGNOSIS — E7800 Pure hypercholesterolemia, unspecified: Secondary | ICD-10-CM | POA: Diagnosis not present

## 2020-12-23 DIAGNOSIS — Z87891 Personal history of nicotine dependence: Secondary | ICD-10-CM | POA: Diagnosis not present

## 2020-12-23 DIAGNOSIS — Z9181 History of falling: Secondary | ICD-10-CM | POA: Diagnosis not present

## 2020-12-23 DIAGNOSIS — M1990 Unspecified osteoarthritis, unspecified site: Secondary | ICD-10-CM | POA: Diagnosis not present

## 2020-12-23 DIAGNOSIS — I1310 Hypertensive heart and chronic kidney disease without heart failure, with stage 1 through stage 4 chronic kidney disease, or unspecified chronic kidney disease: Secondary | ICD-10-CM | POA: Diagnosis not present

## 2020-12-23 DIAGNOSIS — Z792 Long term (current) use of antibiotics: Secondary | ICD-10-CM | POA: Diagnosis not present

## 2020-12-23 DIAGNOSIS — G4730 Sleep apnea, unspecified: Secondary | ICD-10-CM | POA: Diagnosis not present

## 2020-12-23 DIAGNOSIS — M6281 Muscle weakness (generalized): Secondary | ICD-10-CM | POA: Diagnosis not present

## 2020-12-23 DIAGNOSIS — Z87442 Personal history of urinary calculi: Secondary | ICD-10-CM | POA: Diagnosis not present

## 2020-12-23 DIAGNOSIS — G4700 Insomnia, unspecified: Secondary | ICD-10-CM

## 2020-12-23 DIAGNOSIS — F331 Major depressive disorder, recurrent, moderate: Secondary | ICD-10-CM

## 2020-12-23 DIAGNOSIS — Q6102 Congenital multiple renal cysts: Secondary | ICD-10-CM | POA: Diagnosis not present

## 2020-12-24 DIAGNOSIS — M6281 Muscle weakness (generalized): Secondary | ICD-10-CM | POA: Diagnosis not present

## 2020-12-24 DIAGNOSIS — R338 Other retention of urine: Secondary | ICD-10-CM | POA: Diagnosis not present

## 2020-12-24 DIAGNOSIS — N182 Chronic kidney disease, stage 2 (mild): Secondary | ICD-10-CM | POA: Diagnosis not present

## 2020-12-24 DIAGNOSIS — Z792 Long term (current) use of antibiotics: Secondary | ICD-10-CM | POA: Diagnosis not present

## 2020-12-24 DIAGNOSIS — G4730 Sleep apnea, unspecified: Secondary | ICD-10-CM | POA: Diagnosis not present

## 2020-12-24 DIAGNOSIS — E7800 Pure hypercholesterolemia, unspecified: Secondary | ICD-10-CM | POA: Diagnosis not present

## 2020-12-24 DIAGNOSIS — I1310 Hypertensive heart and chronic kidney disease without heart failure, with stage 1 through stage 4 chronic kidney disease, or unspecified chronic kidney disease: Secondary | ICD-10-CM | POA: Diagnosis not present

## 2020-12-24 DIAGNOSIS — Q6102 Congenital multiple renal cysts: Secondary | ICD-10-CM | POA: Diagnosis not present

## 2020-12-24 DIAGNOSIS — F32A Depression, unspecified: Secondary | ICD-10-CM | POA: Diagnosis not present

## 2020-12-24 DIAGNOSIS — Z87442 Personal history of urinary calculi: Secondary | ICD-10-CM | POA: Diagnosis not present

## 2020-12-24 DIAGNOSIS — N390 Urinary tract infection, site not specified: Secondary | ICD-10-CM | POA: Diagnosis not present

## 2020-12-24 DIAGNOSIS — Z9181 History of falling: Secondary | ICD-10-CM | POA: Diagnosis not present

## 2020-12-24 DIAGNOSIS — Z87891 Personal history of nicotine dependence: Secondary | ICD-10-CM | POA: Diagnosis not present

## 2020-12-24 DIAGNOSIS — M1990 Unspecified osteoarthritis, unspecified site: Secondary | ICD-10-CM | POA: Diagnosis not present

## 2020-12-24 DIAGNOSIS — G894 Chronic pain syndrome: Secondary | ICD-10-CM | POA: Diagnosis not present

## 2020-12-24 DIAGNOSIS — M5136 Other intervertebral disc degeneration, lumbar region: Secondary | ICD-10-CM | POA: Diagnosis not present

## 2020-12-24 DIAGNOSIS — M479 Spondylosis, unspecified: Secondary | ICD-10-CM | POA: Diagnosis not present

## 2020-12-24 DIAGNOSIS — R350 Frequency of micturition: Secondary | ICD-10-CM | POA: Diagnosis not present

## 2020-12-25 DIAGNOSIS — N390 Urinary tract infection, site not specified: Secondary | ICD-10-CM | POA: Diagnosis not present

## 2020-12-25 DIAGNOSIS — G4730 Sleep apnea, unspecified: Secondary | ICD-10-CM | POA: Diagnosis not present

## 2020-12-25 DIAGNOSIS — F32A Depression, unspecified: Secondary | ICD-10-CM | POA: Diagnosis not present

## 2020-12-25 DIAGNOSIS — M6281 Muscle weakness (generalized): Secondary | ICD-10-CM | POA: Diagnosis not present

## 2020-12-25 DIAGNOSIS — N182 Chronic kidney disease, stage 2 (mild): Secondary | ICD-10-CM | POA: Diagnosis not present

## 2020-12-25 DIAGNOSIS — Q6102 Congenital multiple renal cysts: Secondary | ICD-10-CM | POA: Diagnosis not present

## 2020-12-25 DIAGNOSIS — I1310 Hypertensive heart and chronic kidney disease without heart failure, with stage 1 through stage 4 chronic kidney disease, or unspecified chronic kidney disease: Secondary | ICD-10-CM | POA: Diagnosis not present

## 2020-12-25 DIAGNOSIS — R350 Frequency of micturition: Secondary | ICD-10-CM | POA: Diagnosis not present

## 2020-12-25 DIAGNOSIS — G894 Chronic pain syndrome: Secondary | ICD-10-CM | POA: Diagnosis not present

## 2020-12-25 DIAGNOSIS — M479 Spondylosis, unspecified: Secondary | ICD-10-CM | POA: Diagnosis not present

## 2020-12-25 DIAGNOSIS — Z87442 Personal history of urinary calculi: Secondary | ICD-10-CM | POA: Diagnosis not present

## 2020-12-25 DIAGNOSIS — Z792 Long term (current) use of antibiotics: Secondary | ICD-10-CM | POA: Diagnosis not present

## 2020-12-25 DIAGNOSIS — M5136 Other intervertebral disc degeneration, lumbar region: Secondary | ICD-10-CM | POA: Diagnosis not present

## 2020-12-25 DIAGNOSIS — Z9181 History of falling: Secondary | ICD-10-CM | POA: Diagnosis not present

## 2020-12-25 DIAGNOSIS — Z87891 Personal history of nicotine dependence: Secondary | ICD-10-CM | POA: Diagnosis not present

## 2020-12-25 DIAGNOSIS — M1990 Unspecified osteoarthritis, unspecified site: Secondary | ICD-10-CM | POA: Diagnosis not present

## 2020-12-25 DIAGNOSIS — E7800 Pure hypercholesterolemia, unspecified: Secondary | ICD-10-CM | POA: Diagnosis not present

## 2020-12-25 DIAGNOSIS — R338 Other retention of urine: Secondary | ICD-10-CM | POA: Diagnosis not present

## 2020-12-26 DIAGNOSIS — N182 Chronic kidney disease, stage 2 (mild): Secondary | ICD-10-CM | POA: Diagnosis not present

## 2020-12-26 DIAGNOSIS — R350 Frequency of micturition: Secondary | ICD-10-CM | POA: Diagnosis not present

## 2020-12-26 DIAGNOSIS — G894 Chronic pain syndrome: Secondary | ICD-10-CM | POA: Diagnosis not present

## 2020-12-26 DIAGNOSIS — Q6102 Congenital multiple renal cysts: Secondary | ICD-10-CM | POA: Diagnosis not present

## 2020-12-26 DIAGNOSIS — Z87442 Personal history of urinary calculi: Secondary | ICD-10-CM | POA: Diagnosis not present

## 2020-12-26 DIAGNOSIS — N390 Urinary tract infection, site not specified: Secondary | ICD-10-CM | POA: Diagnosis not present

## 2020-12-26 DIAGNOSIS — G4730 Sleep apnea, unspecified: Secondary | ICD-10-CM | POA: Diagnosis not present

## 2020-12-26 DIAGNOSIS — Z792 Long term (current) use of antibiotics: Secondary | ICD-10-CM | POA: Diagnosis not present

## 2020-12-26 DIAGNOSIS — M6281 Muscle weakness (generalized): Secondary | ICD-10-CM | POA: Diagnosis not present

## 2020-12-26 DIAGNOSIS — I1310 Hypertensive heart and chronic kidney disease without heart failure, with stage 1 through stage 4 chronic kidney disease, or unspecified chronic kidney disease: Secondary | ICD-10-CM | POA: Diagnosis not present

## 2020-12-26 DIAGNOSIS — Z9181 History of falling: Secondary | ICD-10-CM | POA: Diagnosis not present

## 2020-12-26 DIAGNOSIS — M479 Spondylosis, unspecified: Secondary | ICD-10-CM | POA: Diagnosis not present

## 2020-12-26 DIAGNOSIS — F32A Depression, unspecified: Secondary | ICD-10-CM | POA: Diagnosis not present

## 2020-12-26 DIAGNOSIS — R338 Other retention of urine: Secondary | ICD-10-CM | POA: Diagnosis not present

## 2020-12-26 DIAGNOSIS — M1990 Unspecified osteoarthritis, unspecified site: Secondary | ICD-10-CM | POA: Diagnosis not present

## 2020-12-26 DIAGNOSIS — M5136 Other intervertebral disc degeneration, lumbar region: Secondary | ICD-10-CM | POA: Diagnosis not present

## 2020-12-26 DIAGNOSIS — E7800 Pure hypercholesterolemia, unspecified: Secondary | ICD-10-CM | POA: Diagnosis not present

## 2020-12-26 DIAGNOSIS — Z87891 Personal history of nicotine dependence: Secondary | ICD-10-CM | POA: Diagnosis not present

## 2020-12-27 DIAGNOSIS — E7800 Pure hypercholesterolemia, unspecified: Secondary | ICD-10-CM | POA: Diagnosis not present

## 2020-12-27 DIAGNOSIS — Z9181 History of falling: Secondary | ICD-10-CM | POA: Diagnosis not present

## 2020-12-27 DIAGNOSIS — Z87891 Personal history of nicotine dependence: Secondary | ICD-10-CM | POA: Diagnosis not present

## 2020-12-27 DIAGNOSIS — G4730 Sleep apnea, unspecified: Secondary | ICD-10-CM | POA: Diagnosis not present

## 2020-12-27 DIAGNOSIS — F32A Depression, unspecified: Secondary | ICD-10-CM | POA: Diagnosis not present

## 2020-12-27 DIAGNOSIS — Z87442 Personal history of urinary calculi: Secondary | ICD-10-CM | POA: Diagnosis not present

## 2020-12-27 DIAGNOSIS — I1310 Hypertensive heart and chronic kidney disease without heart failure, with stage 1 through stage 4 chronic kidney disease, or unspecified chronic kidney disease: Secondary | ICD-10-CM | POA: Diagnosis not present

## 2020-12-27 DIAGNOSIS — R338 Other retention of urine: Secondary | ICD-10-CM | POA: Diagnosis not present

## 2020-12-27 DIAGNOSIS — M6281 Muscle weakness (generalized): Secondary | ICD-10-CM | POA: Diagnosis not present

## 2020-12-27 DIAGNOSIS — G894 Chronic pain syndrome: Secondary | ICD-10-CM | POA: Diagnosis not present

## 2020-12-27 DIAGNOSIS — R350 Frequency of micturition: Secondary | ICD-10-CM | POA: Diagnosis not present

## 2020-12-27 DIAGNOSIS — M1990 Unspecified osteoarthritis, unspecified site: Secondary | ICD-10-CM | POA: Diagnosis not present

## 2020-12-27 DIAGNOSIS — M5136 Other intervertebral disc degeneration, lumbar region: Secondary | ICD-10-CM | POA: Diagnosis not present

## 2020-12-27 DIAGNOSIS — N182 Chronic kidney disease, stage 2 (mild): Secondary | ICD-10-CM | POA: Diagnosis not present

## 2020-12-27 DIAGNOSIS — M479 Spondylosis, unspecified: Secondary | ICD-10-CM | POA: Diagnosis not present

## 2020-12-27 DIAGNOSIS — Q6102 Congenital multiple renal cysts: Secondary | ICD-10-CM | POA: Diagnosis not present

## 2020-12-27 DIAGNOSIS — N390 Urinary tract infection, site not specified: Secondary | ICD-10-CM | POA: Diagnosis not present

## 2020-12-27 DIAGNOSIS — Z792 Long term (current) use of antibiotics: Secondary | ICD-10-CM | POA: Diagnosis not present

## 2020-12-31 DIAGNOSIS — G894 Chronic pain syndrome: Secondary | ICD-10-CM | POA: Diagnosis not present

## 2020-12-31 DIAGNOSIS — M21371 Foot drop, right foot: Secondary | ICD-10-CM | POA: Diagnosis not present

## 2020-12-31 DIAGNOSIS — R0601 Orthopnea: Secondary | ICD-10-CM | POA: Diagnosis not present

## 2020-12-31 DIAGNOSIS — Z7409 Other reduced mobility: Secondary | ICD-10-CM | POA: Diagnosis not present

## 2021-01-01 DIAGNOSIS — N182 Chronic kidney disease, stage 2 (mild): Secondary | ICD-10-CM | POA: Diagnosis not present

## 2021-01-01 DIAGNOSIS — G4730 Sleep apnea, unspecified: Secondary | ICD-10-CM | POA: Diagnosis not present

## 2021-01-01 DIAGNOSIS — E7800 Pure hypercholesterolemia, unspecified: Secondary | ICD-10-CM | POA: Diagnosis not present

## 2021-01-01 DIAGNOSIS — Z9181 History of falling: Secondary | ICD-10-CM | POA: Diagnosis not present

## 2021-01-01 DIAGNOSIS — M479 Spondylosis, unspecified: Secondary | ICD-10-CM | POA: Diagnosis not present

## 2021-01-01 DIAGNOSIS — F32A Depression, unspecified: Secondary | ICD-10-CM | POA: Diagnosis not present

## 2021-01-01 DIAGNOSIS — Z792 Long term (current) use of antibiotics: Secondary | ICD-10-CM | POA: Diagnosis not present

## 2021-01-01 DIAGNOSIS — R350 Frequency of micturition: Secondary | ICD-10-CM | POA: Diagnosis not present

## 2021-01-01 DIAGNOSIS — M5136 Other intervertebral disc degeneration, lumbar region: Secondary | ICD-10-CM | POA: Diagnosis not present

## 2021-01-01 DIAGNOSIS — M1990 Unspecified osteoarthritis, unspecified site: Secondary | ICD-10-CM | POA: Diagnosis not present

## 2021-01-01 DIAGNOSIS — R338 Other retention of urine: Secondary | ICD-10-CM | POA: Diagnosis not present

## 2021-01-01 DIAGNOSIS — N390 Urinary tract infection, site not specified: Secondary | ICD-10-CM | POA: Diagnosis not present

## 2021-01-01 DIAGNOSIS — G894 Chronic pain syndrome: Secondary | ICD-10-CM | POA: Diagnosis not present

## 2021-01-01 DIAGNOSIS — Q6102 Congenital multiple renal cysts: Secondary | ICD-10-CM | POA: Diagnosis not present

## 2021-01-01 DIAGNOSIS — Z87442 Personal history of urinary calculi: Secondary | ICD-10-CM | POA: Diagnosis not present

## 2021-01-01 DIAGNOSIS — Z87891 Personal history of nicotine dependence: Secondary | ICD-10-CM | POA: Diagnosis not present

## 2021-01-01 DIAGNOSIS — M6281 Muscle weakness (generalized): Secondary | ICD-10-CM | POA: Diagnosis not present

## 2021-01-01 DIAGNOSIS — I1310 Hypertensive heart and chronic kidney disease without heart failure, with stage 1 through stage 4 chronic kidney disease, or unspecified chronic kidney disease: Secondary | ICD-10-CM | POA: Diagnosis not present

## 2021-01-07 DIAGNOSIS — Z9181 History of falling: Secondary | ICD-10-CM | POA: Diagnosis not present

## 2021-01-07 DIAGNOSIS — F32A Depression, unspecified: Secondary | ICD-10-CM | POA: Diagnosis not present

## 2021-01-07 DIAGNOSIS — R338 Other retention of urine: Secondary | ICD-10-CM | POA: Diagnosis not present

## 2021-01-07 DIAGNOSIS — Z792 Long term (current) use of antibiotics: Secondary | ICD-10-CM | POA: Diagnosis not present

## 2021-01-07 DIAGNOSIS — M479 Spondylosis, unspecified: Secondary | ICD-10-CM | POA: Diagnosis not present

## 2021-01-07 DIAGNOSIS — N182 Chronic kidney disease, stage 2 (mild): Secondary | ICD-10-CM | POA: Diagnosis not present

## 2021-01-07 DIAGNOSIS — N390 Urinary tract infection, site not specified: Secondary | ICD-10-CM | POA: Diagnosis not present

## 2021-01-07 DIAGNOSIS — R350 Frequency of micturition: Secondary | ICD-10-CM | POA: Diagnosis not present

## 2021-01-07 DIAGNOSIS — G4730 Sleep apnea, unspecified: Secondary | ICD-10-CM | POA: Diagnosis not present

## 2021-01-07 DIAGNOSIS — Z87891 Personal history of nicotine dependence: Secondary | ICD-10-CM | POA: Diagnosis not present

## 2021-01-07 DIAGNOSIS — E7800 Pure hypercholesterolemia, unspecified: Secondary | ICD-10-CM | POA: Diagnosis not present

## 2021-01-07 DIAGNOSIS — I1310 Hypertensive heart and chronic kidney disease without heart failure, with stage 1 through stage 4 chronic kidney disease, or unspecified chronic kidney disease: Secondary | ICD-10-CM | POA: Diagnosis not present

## 2021-01-07 DIAGNOSIS — Z87442 Personal history of urinary calculi: Secondary | ICD-10-CM | POA: Diagnosis not present

## 2021-01-07 DIAGNOSIS — Q6102 Congenital multiple renal cysts: Secondary | ICD-10-CM | POA: Diagnosis not present

## 2021-01-07 DIAGNOSIS — M5136 Other intervertebral disc degeneration, lumbar region: Secondary | ICD-10-CM | POA: Diagnosis not present

## 2021-01-07 DIAGNOSIS — G894 Chronic pain syndrome: Secondary | ICD-10-CM | POA: Diagnosis not present

## 2021-01-07 DIAGNOSIS — M1990 Unspecified osteoarthritis, unspecified site: Secondary | ICD-10-CM | POA: Diagnosis not present

## 2021-01-07 DIAGNOSIS — M6281 Muscle weakness (generalized): Secondary | ICD-10-CM | POA: Diagnosis not present

## 2021-01-09 DIAGNOSIS — N390 Urinary tract infection, site not specified: Secondary | ICD-10-CM | POA: Diagnosis not present

## 2021-01-09 DIAGNOSIS — Z792 Long term (current) use of antibiotics: Secondary | ICD-10-CM | POA: Diagnosis not present

## 2021-01-09 DIAGNOSIS — M6281 Muscle weakness (generalized): Secondary | ICD-10-CM | POA: Diagnosis not present

## 2021-01-09 DIAGNOSIS — G4730 Sleep apnea, unspecified: Secondary | ICD-10-CM | POA: Diagnosis not present

## 2021-01-09 DIAGNOSIS — Z87891 Personal history of nicotine dependence: Secondary | ICD-10-CM | POA: Diagnosis not present

## 2021-01-09 DIAGNOSIS — M1990 Unspecified osteoarthritis, unspecified site: Secondary | ICD-10-CM | POA: Diagnosis not present

## 2021-01-09 DIAGNOSIS — E7800 Pure hypercholesterolemia, unspecified: Secondary | ICD-10-CM | POA: Diagnosis not present

## 2021-01-09 DIAGNOSIS — G894 Chronic pain syndrome: Secondary | ICD-10-CM | POA: Diagnosis not present

## 2021-01-09 DIAGNOSIS — N182 Chronic kidney disease, stage 2 (mild): Secondary | ICD-10-CM | POA: Diagnosis not present

## 2021-01-09 DIAGNOSIS — Z87442 Personal history of urinary calculi: Secondary | ICD-10-CM | POA: Diagnosis not present

## 2021-01-09 DIAGNOSIS — M5136 Other intervertebral disc degeneration, lumbar region: Secondary | ICD-10-CM | POA: Diagnosis not present

## 2021-01-09 DIAGNOSIS — F32A Depression, unspecified: Secondary | ICD-10-CM | POA: Diagnosis not present

## 2021-01-09 DIAGNOSIS — Q6102 Congenital multiple renal cysts: Secondary | ICD-10-CM | POA: Diagnosis not present

## 2021-01-09 DIAGNOSIS — R350 Frequency of micturition: Secondary | ICD-10-CM | POA: Diagnosis not present

## 2021-01-09 DIAGNOSIS — Z9181 History of falling: Secondary | ICD-10-CM | POA: Diagnosis not present

## 2021-01-09 DIAGNOSIS — I1310 Hypertensive heart and chronic kidney disease without heart failure, with stage 1 through stage 4 chronic kidney disease, or unspecified chronic kidney disease: Secondary | ICD-10-CM | POA: Diagnosis not present

## 2021-01-09 DIAGNOSIS — R338 Other retention of urine: Secondary | ICD-10-CM | POA: Diagnosis not present

## 2021-01-09 DIAGNOSIS — M479 Spondylosis, unspecified: Secondary | ICD-10-CM | POA: Diagnosis not present

## 2021-01-10 DIAGNOSIS — E7800 Pure hypercholesterolemia, unspecified: Secondary | ICD-10-CM | POA: Diagnosis not present

## 2021-01-10 DIAGNOSIS — Z9181 History of falling: Secondary | ICD-10-CM | POA: Diagnosis not present

## 2021-01-10 DIAGNOSIS — R350 Frequency of micturition: Secondary | ICD-10-CM | POA: Diagnosis not present

## 2021-01-10 DIAGNOSIS — F32A Depression, unspecified: Secondary | ICD-10-CM | POA: Diagnosis not present

## 2021-01-10 DIAGNOSIS — R338 Other retention of urine: Secondary | ICD-10-CM | POA: Diagnosis not present

## 2021-01-10 DIAGNOSIS — N390 Urinary tract infection, site not specified: Secondary | ICD-10-CM | POA: Diagnosis not present

## 2021-01-10 DIAGNOSIS — G4730 Sleep apnea, unspecified: Secondary | ICD-10-CM | POA: Diagnosis not present

## 2021-01-10 DIAGNOSIS — M5136 Other intervertebral disc degeneration, lumbar region: Secondary | ICD-10-CM | POA: Diagnosis not present

## 2021-01-10 DIAGNOSIS — I1310 Hypertensive heart and chronic kidney disease without heart failure, with stage 1 through stage 4 chronic kidney disease, or unspecified chronic kidney disease: Secondary | ICD-10-CM | POA: Diagnosis not present

## 2021-01-10 DIAGNOSIS — M479 Spondylosis, unspecified: Secondary | ICD-10-CM | POA: Diagnosis not present

## 2021-01-10 DIAGNOSIS — Z87891 Personal history of nicotine dependence: Secondary | ICD-10-CM | POA: Diagnosis not present

## 2021-01-10 DIAGNOSIS — G894 Chronic pain syndrome: Secondary | ICD-10-CM | POA: Diagnosis not present

## 2021-01-10 DIAGNOSIS — M1990 Unspecified osteoarthritis, unspecified site: Secondary | ICD-10-CM | POA: Diagnosis not present

## 2021-01-10 DIAGNOSIS — Q6102 Congenital multiple renal cysts: Secondary | ICD-10-CM | POA: Diagnosis not present

## 2021-01-10 DIAGNOSIS — Z87442 Personal history of urinary calculi: Secondary | ICD-10-CM | POA: Diagnosis not present

## 2021-01-10 DIAGNOSIS — Z792 Long term (current) use of antibiotics: Secondary | ICD-10-CM | POA: Diagnosis not present

## 2021-01-10 DIAGNOSIS — M6281 Muscle weakness (generalized): Secondary | ICD-10-CM | POA: Diagnosis not present

## 2021-01-10 DIAGNOSIS — N182 Chronic kidney disease, stage 2 (mild): Secondary | ICD-10-CM | POA: Diagnosis not present

## 2021-01-11 DIAGNOSIS — M5136 Other intervertebral disc degeneration, lumbar region: Secondary | ICD-10-CM | POA: Diagnosis not present

## 2021-01-11 DIAGNOSIS — F32A Depression, unspecified: Secondary | ICD-10-CM | POA: Diagnosis not present

## 2021-01-11 DIAGNOSIS — Z87891 Personal history of nicotine dependence: Secondary | ICD-10-CM | POA: Diagnosis not present

## 2021-01-11 DIAGNOSIS — G894 Chronic pain syndrome: Secondary | ICD-10-CM | POA: Diagnosis not present

## 2021-01-11 DIAGNOSIS — M6281 Muscle weakness (generalized): Secondary | ICD-10-CM | POA: Diagnosis not present

## 2021-01-11 DIAGNOSIS — I1310 Hypertensive heart and chronic kidney disease without heart failure, with stage 1 through stage 4 chronic kidney disease, or unspecified chronic kidney disease: Secondary | ICD-10-CM | POA: Diagnosis not present

## 2021-01-11 DIAGNOSIS — M479 Spondylosis, unspecified: Secondary | ICD-10-CM | POA: Diagnosis not present

## 2021-01-11 DIAGNOSIS — Z87442 Personal history of urinary calculi: Secondary | ICD-10-CM | POA: Diagnosis not present

## 2021-01-11 DIAGNOSIS — Q6102 Congenital multiple renal cysts: Secondary | ICD-10-CM | POA: Diagnosis not present

## 2021-01-11 DIAGNOSIS — Z9181 History of falling: Secondary | ICD-10-CM | POA: Diagnosis not present

## 2021-01-11 DIAGNOSIS — G4730 Sleep apnea, unspecified: Secondary | ICD-10-CM | POA: Diagnosis not present

## 2021-01-11 DIAGNOSIS — R350 Frequency of micturition: Secondary | ICD-10-CM | POA: Diagnosis not present

## 2021-01-11 DIAGNOSIS — M1990 Unspecified osteoarthritis, unspecified site: Secondary | ICD-10-CM | POA: Diagnosis not present

## 2021-01-11 DIAGNOSIS — E7800 Pure hypercholesterolemia, unspecified: Secondary | ICD-10-CM | POA: Diagnosis not present

## 2021-01-11 DIAGNOSIS — N390 Urinary tract infection, site not specified: Secondary | ICD-10-CM | POA: Diagnosis not present

## 2021-01-11 DIAGNOSIS — R338 Other retention of urine: Secondary | ICD-10-CM | POA: Diagnosis not present

## 2021-01-11 DIAGNOSIS — N182 Chronic kidney disease, stage 2 (mild): Secondary | ICD-10-CM | POA: Diagnosis not present

## 2021-01-11 DIAGNOSIS — Z792 Long term (current) use of antibiotics: Secondary | ICD-10-CM | POA: Diagnosis not present

## 2021-01-13 DIAGNOSIS — Z87891 Personal history of nicotine dependence: Secondary | ICD-10-CM | POA: Diagnosis not present

## 2021-01-13 DIAGNOSIS — Z87442 Personal history of urinary calculi: Secondary | ICD-10-CM | POA: Diagnosis not present

## 2021-01-13 DIAGNOSIS — Q6102 Congenital multiple renal cysts: Secondary | ICD-10-CM | POA: Diagnosis not present

## 2021-01-13 DIAGNOSIS — G4730 Sleep apnea, unspecified: Secondary | ICD-10-CM | POA: Diagnosis not present

## 2021-01-13 DIAGNOSIS — G894 Chronic pain syndrome: Secondary | ICD-10-CM | POA: Diagnosis not present

## 2021-01-13 DIAGNOSIS — N390 Urinary tract infection, site not specified: Secondary | ICD-10-CM | POA: Diagnosis not present

## 2021-01-13 DIAGNOSIS — M5136 Other intervertebral disc degeneration, lumbar region: Secondary | ICD-10-CM | POA: Diagnosis not present

## 2021-01-13 DIAGNOSIS — M1990 Unspecified osteoarthritis, unspecified site: Secondary | ICD-10-CM | POA: Diagnosis not present

## 2021-01-13 DIAGNOSIS — M479 Spondylosis, unspecified: Secondary | ICD-10-CM | POA: Diagnosis not present

## 2021-01-13 DIAGNOSIS — G629 Polyneuropathy, unspecified: Secondary | ICD-10-CM | POA: Diagnosis not present

## 2021-01-13 DIAGNOSIS — R338 Other retention of urine: Secondary | ICD-10-CM | POA: Diagnosis not present

## 2021-01-13 DIAGNOSIS — Z792 Long term (current) use of antibiotics: Secondary | ICD-10-CM | POA: Diagnosis not present

## 2021-01-13 DIAGNOSIS — N182 Chronic kidney disease, stage 2 (mild): Secondary | ICD-10-CM | POA: Diagnosis not present

## 2021-01-13 DIAGNOSIS — R350 Frequency of micturition: Secondary | ICD-10-CM | POA: Diagnosis not present

## 2021-01-13 DIAGNOSIS — Z23 Encounter for immunization: Secondary | ICD-10-CM | POA: Diagnosis not present

## 2021-01-13 DIAGNOSIS — E7800 Pure hypercholesterolemia, unspecified: Secondary | ICD-10-CM | POA: Diagnosis not present

## 2021-01-13 DIAGNOSIS — Z9181 History of falling: Secondary | ICD-10-CM | POA: Diagnosis not present

## 2021-01-13 DIAGNOSIS — F32A Depression, unspecified: Secondary | ICD-10-CM | POA: Diagnosis not present

## 2021-01-13 DIAGNOSIS — I1310 Hypertensive heart and chronic kidney disease without heart failure, with stage 1 through stage 4 chronic kidney disease, or unspecified chronic kidney disease: Secondary | ICD-10-CM | POA: Diagnosis not present

## 2021-01-13 DIAGNOSIS — M6281 Muscle weakness (generalized): Secondary | ICD-10-CM | POA: Diagnosis not present

## 2021-01-14 DIAGNOSIS — N182 Chronic kidney disease, stage 2 (mild): Secondary | ICD-10-CM | POA: Diagnosis not present

## 2021-01-14 DIAGNOSIS — Z9181 History of falling: Secondary | ICD-10-CM | POA: Diagnosis not present

## 2021-01-14 DIAGNOSIS — M1990 Unspecified osteoarthritis, unspecified site: Secondary | ICD-10-CM | POA: Diagnosis not present

## 2021-01-14 DIAGNOSIS — N390 Urinary tract infection, site not specified: Secondary | ICD-10-CM | POA: Diagnosis not present

## 2021-01-14 DIAGNOSIS — G894 Chronic pain syndrome: Secondary | ICD-10-CM | POA: Diagnosis not present

## 2021-01-14 DIAGNOSIS — R338 Other retention of urine: Secondary | ICD-10-CM | POA: Diagnosis not present

## 2021-01-14 DIAGNOSIS — F32A Depression, unspecified: Secondary | ICD-10-CM | POA: Diagnosis not present

## 2021-01-14 DIAGNOSIS — M6281 Muscle weakness (generalized): Secondary | ICD-10-CM | POA: Diagnosis not present

## 2021-01-14 DIAGNOSIS — Z792 Long term (current) use of antibiotics: Secondary | ICD-10-CM | POA: Diagnosis not present

## 2021-01-14 DIAGNOSIS — I1310 Hypertensive heart and chronic kidney disease without heart failure, with stage 1 through stage 4 chronic kidney disease, or unspecified chronic kidney disease: Secondary | ICD-10-CM | POA: Diagnosis not present

## 2021-01-14 DIAGNOSIS — Z87442 Personal history of urinary calculi: Secondary | ICD-10-CM | POA: Diagnosis not present

## 2021-01-14 DIAGNOSIS — R350 Frequency of micturition: Secondary | ICD-10-CM | POA: Diagnosis not present

## 2021-01-14 DIAGNOSIS — M5136 Other intervertebral disc degeneration, lumbar region: Secondary | ICD-10-CM | POA: Diagnosis not present

## 2021-01-14 DIAGNOSIS — M479 Spondylosis, unspecified: Secondary | ICD-10-CM | POA: Diagnosis not present

## 2021-01-14 DIAGNOSIS — Z87891 Personal history of nicotine dependence: Secondary | ICD-10-CM | POA: Diagnosis not present

## 2021-01-14 DIAGNOSIS — Q6102 Congenital multiple renal cysts: Secondary | ICD-10-CM | POA: Diagnosis not present

## 2021-01-14 DIAGNOSIS — G4730 Sleep apnea, unspecified: Secondary | ICD-10-CM | POA: Diagnosis not present

## 2021-01-14 DIAGNOSIS — E7800 Pure hypercholesterolemia, unspecified: Secondary | ICD-10-CM | POA: Diagnosis not present

## 2021-01-15 DIAGNOSIS — G894 Chronic pain syndrome: Secondary | ICD-10-CM | POA: Diagnosis not present

## 2021-01-15 DIAGNOSIS — F32A Depression, unspecified: Secondary | ICD-10-CM | POA: Diagnosis not present

## 2021-01-15 DIAGNOSIS — Z87891 Personal history of nicotine dependence: Secondary | ICD-10-CM | POA: Diagnosis not present

## 2021-01-15 DIAGNOSIS — R350 Frequency of micturition: Secondary | ICD-10-CM | POA: Diagnosis not present

## 2021-01-15 DIAGNOSIS — G4730 Sleep apnea, unspecified: Secondary | ICD-10-CM | POA: Diagnosis not present

## 2021-01-15 DIAGNOSIS — M1990 Unspecified osteoarthritis, unspecified site: Secondary | ICD-10-CM | POA: Diagnosis not present

## 2021-01-15 DIAGNOSIS — N390 Urinary tract infection, site not specified: Secondary | ICD-10-CM | POA: Diagnosis not present

## 2021-01-15 DIAGNOSIS — E7800 Pure hypercholesterolemia, unspecified: Secondary | ICD-10-CM | POA: Diagnosis not present

## 2021-01-15 DIAGNOSIS — N182 Chronic kidney disease, stage 2 (mild): Secondary | ICD-10-CM | POA: Diagnosis not present

## 2021-01-15 DIAGNOSIS — Z792 Long term (current) use of antibiotics: Secondary | ICD-10-CM | POA: Diagnosis not present

## 2021-01-15 DIAGNOSIS — M5136 Other intervertebral disc degeneration, lumbar region: Secondary | ICD-10-CM | POA: Diagnosis not present

## 2021-01-15 DIAGNOSIS — I1310 Hypertensive heart and chronic kidney disease without heart failure, with stage 1 through stage 4 chronic kidney disease, or unspecified chronic kidney disease: Secondary | ICD-10-CM | POA: Diagnosis not present

## 2021-01-15 DIAGNOSIS — Z9181 History of falling: Secondary | ICD-10-CM | POA: Diagnosis not present

## 2021-01-15 DIAGNOSIS — M479 Spondylosis, unspecified: Secondary | ICD-10-CM | POA: Diagnosis not present

## 2021-01-15 DIAGNOSIS — Q6102 Congenital multiple renal cysts: Secondary | ICD-10-CM | POA: Diagnosis not present

## 2021-01-15 DIAGNOSIS — M6281 Muscle weakness (generalized): Secondary | ICD-10-CM | POA: Diagnosis not present

## 2021-01-15 DIAGNOSIS — R338 Other retention of urine: Secondary | ICD-10-CM | POA: Diagnosis not present

## 2021-01-15 DIAGNOSIS — Z87442 Personal history of urinary calculi: Secondary | ICD-10-CM | POA: Diagnosis not present

## 2021-01-16 DIAGNOSIS — M5136 Other intervertebral disc degeneration, lumbar region: Secondary | ICD-10-CM | POA: Diagnosis not present

## 2021-01-16 DIAGNOSIS — Z87442 Personal history of urinary calculi: Secondary | ICD-10-CM | POA: Diagnosis not present

## 2021-01-16 DIAGNOSIS — F32A Depression, unspecified: Secondary | ICD-10-CM | POA: Diagnosis not present

## 2021-01-16 DIAGNOSIS — G4730 Sleep apnea, unspecified: Secondary | ICD-10-CM | POA: Diagnosis not present

## 2021-01-16 DIAGNOSIS — Z792 Long term (current) use of antibiotics: Secondary | ICD-10-CM | POA: Diagnosis not present

## 2021-01-16 DIAGNOSIS — N390 Urinary tract infection, site not specified: Secondary | ICD-10-CM | POA: Diagnosis not present

## 2021-01-16 DIAGNOSIS — I1310 Hypertensive heart and chronic kidney disease without heart failure, with stage 1 through stage 4 chronic kidney disease, or unspecified chronic kidney disease: Secondary | ICD-10-CM | POA: Diagnosis not present

## 2021-01-16 DIAGNOSIS — M6281 Muscle weakness (generalized): Secondary | ICD-10-CM | POA: Diagnosis not present

## 2021-01-16 DIAGNOSIS — G894 Chronic pain syndrome: Secondary | ICD-10-CM | POA: Diagnosis not present

## 2021-01-16 DIAGNOSIS — E7800 Pure hypercholesterolemia, unspecified: Secondary | ICD-10-CM | POA: Diagnosis not present

## 2021-01-16 DIAGNOSIS — Q6102 Congenital multiple renal cysts: Secondary | ICD-10-CM | POA: Diagnosis not present

## 2021-01-16 DIAGNOSIS — R350 Frequency of micturition: Secondary | ICD-10-CM | POA: Diagnosis not present

## 2021-01-16 DIAGNOSIS — M1990 Unspecified osteoarthritis, unspecified site: Secondary | ICD-10-CM | POA: Diagnosis not present

## 2021-01-16 DIAGNOSIS — N182 Chronic kidney disease, stage 2 (mild): Secondary | ICD-10-CM | POA: Diagnosis not present

## 2021-01-16 DIAGNOSIS — R338 Other retention of urine: Secondary | ICD-10-CM | POA: Diagnosis not present

## 2021-01-16 DIAGNOSIS — Z9181 History of falling: Secondary | ICD-10-CM | POA: Diagnosis not present

## 2021-01-16 DIAGNOSIS — Z87891 Personal history of nicotine dependence: Secondary | ICD-10-CM | POA: Diagnosis not present

## 2021-01-16 DIAGNOSIS — M479 Spondylosis, unspecified: Secondary | ICD-10-CM | POA: Diagnosis not present

## 2021-01-17 DIAGNOSIS — Z9181 History of falling: Secondary | ICD-10-CM | POA: Diagnosis not present

## 2021-01-17 DIAGNOSIS — M1990 Unspecified osteoarthritis, unspecified site: Secondary | ICD-10-CM | POA: Diagnosis not present

## 2021-01-17 DIAGNOSIS — R350 Frequency of micturition: Secondary | ICD-10-CM | POA: Diagnosis not present

## 2021-01-17 DIAGNOSIS — R338 Other retention of urine: Secondary | ICD-10-CM | POA: Diagnosis not present

## 2021-01-17 DIAGNOSIS — Z87891 Personal history of nicotine dependence: Secondary | ICD-10-CM | POA: Diagnosis not present

## 2021-01-17 DIAGNOSIS — Q6102 Congenital multiple renal cysts: Secondary | ICD-10-CM | POA: Diagnosis not present

## 2021-01-17 DIAGNOSIS — I1310 Hypertensive heart and chronic kidney disease without heart failure, with stage 1 through stage 4 chronic kidney disease, or unspecified chronic kidney disease: Secondary | ICD-10-CM | POA: Diagnosis not present

## 2021-01-17 DIAGNOSIS — E7800 Pure hypercholesterolemia, unspecified: Secondary | ICD-10-CM | POA: Diagnosis not present

## 2021-01-17 DIAGNOSIS — N182 Chronic kidney disease, stage 2 (mild): Secondary | ICD-10-CM | POA: Diagnosis not present

## 2021-01-17 DIAGNOSIS — N390 Urinary tract infection, site not specified: Secondary | ICD-10-CM | POA: Diagnosis not present

## 2021-01-17 DIAGNOSIS — F32A Depression, unspecified: Secondary | ICD-10-CM | POA: Diagnosis not present

## 2021-01-17 DIAGNOSIS — G4730 Sleep apnea, unspecified: Secondary | ICD-10-CM | POA: Diagnosis not present

## 2021-01-17 DIAGNOSIS — M5136 Other intervertebral disc degeneration, lumbar region: Secondary | ICD-10-CM | POA: Diagnosis not present

## 2021-01-17 DIAGNOSIS — G894 Chronic pain syndrome: Secondary | ICD-10-CM | POA: Diagnosis not present

## 2021-01-17 DIAGNOSIS — M6281 Muscle weakness (generalized): Secondary | ICD-10-CM | POA: Diagnosis not present

## 2021-01-17 DIAGNOSIS — Z792 Long term (current) use of antibiotics: Secondary | ICD-10-CM | POA: Diagnosis not present

## 2021-01-17 DIAGNOSIS — Z87442 Personal history of urinary calculi: Secondary | ICD-10-CM | POA: Diagnosis not present

## 2021-01-17 DIAGNOSIS — M479 Spondylosis, unspecified: Secondary | ICD-10-CM | POA: Diagnosis not present

## 2021-01-21 ENCOUNTER — Other Ambulatory Visit: Payer: Self-pay | Admitting: Adult Health

## 2021-01-21 DIAGNOSIS — G4700 Insomnia, unspecified: Secondary | ICD-10-CM

## 2021-01-21 DIAGNOSIS — F331 Major depressive disorder, recurrent, moderate: Secondary | ICD-10-CM

## 2021-01-21 DIAGNOSIS — F329 Major depressive disorder, single episode, unspecified: Secondary | ICD-10-CM

## 2021-01-22 DIAGNOSIS — Z87891 Personal history of nicotine dependence: Secondary | ICD-10-CM | POA: Diagnosis not present

## 2021-01-22 DIAGNOSIS — F32A Depression, unspecified: Secondary | ICD-10-CM | POA: Diagnosis not present

## 2021-01-22 DIAGNOSIS — N182 Chronic kidney disease, stage 2 (mild): Secondary | ICD-10-CM | POA: Diagnosis not present

## 2021-01-22 DIAGNOSIS — R338 Other retention of urine: Secondary | ICD-10-CM | POA: Diagnosis not present

## 2021-01-22 DIAGNOSIS — M1990 Unspecified osteoarthritis, unspecified site: Secondary | ICD-10-CM | POA: Diagnosis not present

## 2021-01-22 DIAGNOSIS — E7800 Pure hypercholesterolemia, unspecified: Secondary | ICD-10-CM | POA: Diagnosis not present

## 2021-01-22 DIAGNOSIS — Z9181 History of falling: Secondary | ICD-10-CM | POA: Diagnosis not present

## 2021-01-22 DIAGNOSIS — G4730 Sleep apnea, unspecified: Secondary | ICD-10-CM | POA: Diagnosis not present

## 2021-01-22 DIAGNOSIS — M5136 Other intervertebral disc degeneration, lumbar region: Secondary | ICD-10-CM | POA: Diagnosis not present

## 2021-01-22 DIAGNOSIS — M479 Spondylosis, unspecified: Secondary | ICD-10-CM | POA: Diagnosis not present

## 2021-01-22 DIAGNOSIS — Q6102 Congenital multiple renal cysts: Secondary | ICD-10-CM | POA: Diagnosis not present

## 2021-01-22 DIAGNOSIS — Z792 Long term (current) use of antibiotics: Secondary | ICD-10-CM | POA: Diagnosis not present

## 2021-01-22 DIAGNOSIS — M6281 Muscle weakness (generalized): Secondary | ICD-10-CM | POA: Diagnosis not present

## 2021-01-22 DIAGNOSIS — R350 Frequency of micturition: Secondary | ICD-10-CM | POA: Diagnosis not present

## 2021-01-22 DIAGNOSIS — N390 Urinary tract infection, site not specified: Secondary | ICD-10-CM | POA: Diagnosis not present

## 2021-01-22 DIAGNOSIS — I1310 Hypertensive heart and chronic kidney disease without heart failure, with stage 1 through stage 4 chronic kidney disease, or unspecified chronic kidney disease: Secondary | ICD-10-CM | POA: Diagnosis not present

## 2021-01-22 DIAGNOSIS — G894 Chronic pain syndrome: Secondary | ICD-10-CM | POA: Diagnosis not present

## 2021-01-22 DIAGNOSIS — Z87442 Personal history of urinary calculi: Secondary | ICD-10-CM | POA: Diagnosis not present

## 2021-01-24 DIAGNOSIS — N390 Urinary tract infection, site not specified: Secondary | ICD-10-CM | POA: Diagnosis not present

## 2021-01-24 DIAGNOSIS — Z9181 History of falling: Secondary | ICD-10-CM | POA: Diagnosis not present

## 2021-01-24 DIAGNOSIS — Z87891 Personal history of nicotine dependence: Secondary | ICD-10-CM | POA: Diagnosis not present

## 2021-01-24 DIAGNOSIS — M1990 Unspecified osteoarthritis, unspecified site: Secondary | ICD-10-CM | POA: Diagnosis not present

## 2021-01-24 DIAGNOSIS — Z792 Long term (current) use of antibiotics: Secondary | ICD-10-CM | POA: Diagnosis not present

## 2021-01-24 DIAGNOSIS — Z1211 Encounter for screening for malignant neoplasm of colon: Secondary | ICD-10-CM | POA: Diagnosis not present

## 2021-01-24 DIAGNOSIS — Z87442 Personal history of urinary calculi: Secondary | ICD-10-CM | POA: Diagnosis not present

## 2021-01-24 DIAGNOSIS — M479 Spondylosis, unspecified: Secondary | ICD-10-CM | POA: Diagnosis not present

## 2021-01-24 DIAGNOSIS — M5136 Other intervertebral disc degeneration, lumbar region: Secondary | ICD-10-CM | POA: Diagnosis not present

## 2021-01-24 DIAGNOSIS — M6281 Muscle weakness (generalized): Secondary | ICD-10-CM | POA: Diagnosis not present

## 2021-01-24 DIAGNOSIS — I1310 Hypertensive heart and chronic kidney disease without heart failure, with stage 1 through stage 4 chronic kidney disease, or unspecified chronic kidney disease: Secondary | ICD-10-CM | POA: Diagnosis not present

## 2021-01-24 DIAGNOSIS — N182 Chronic kidney disease, stage 2 (mild): Secondary | ICD-10-CM | POA: Diagnosis not present

## 2021-01-24 DIAGNOSIS — G4730 Sleep apnea, unspecified: Secondary | ICD-10-CM | POA: Diagnosis not present

## 2021-01-24 DIAGNOSIS — G894 Chronic pain syndrome: Secondary | ICD-10-CM | POA: Diagnosis not present

## 2021-01-24 DIAGNOSIS — Q6102 Congenital multiple renal cysts: Secondary | ICD-10-CM | POA: Diagnosis not present

## 2021-01-24 DIAGNOSIS — R350 Frequency of micturition: Secondary | ICD-10-CM | POA: Diagnosis not present

## 2021-01-24 DIAGNOSIS — F32A Depression, unspecified: Secondary | ICD-10-CM | POA: Diagnosis not present

## 2021-01-24 DIAGNOSIS — E7800 Pure hypercholesterolemia, unspecified: Secondary | ICD-10-CM | POA: Diagnosis not present

## 2021-01-24 DIAGNOSIS — R338 Other retention of urine: Secondary | ICD-10-CM | POA: Diagnosis not present

## 2021-01-29 DIAGNOSIS — M1990 Unspecified osteoarthritis, unspecified site: Secondary | ICD-10-CM | POA: Diagnosis not present

## 2021-01-29 DIAGNOSIS — F32A Depression, unspecified: Secondary | ICD-10-CM | POA: Diagnosis not present

## 2021-01-29 DIAGNOSIS — Z792 Long term (current) use of antibiotics: Secondary | ICD-10-CM | POA: Diagnosis not present

## 2021-01-29 DIAGNOSIS — N182 Chronic kidney disease, stage 2 (mild): Secondary | ICD-10-CM | POA: Diagnosis not present

## 2021-01-29 DIAGNOSIS — M6281 Muscle weakness (generalized): Secondary | ICD-10-CM | POA: Diagnosis not present

## 2021-01-29 DIAGNOSIS — I1310 Hypertensive heart and chronic kidney disease without heart failure, with stage 1 through stage 4 chronic kidney disease, or unspecified chronic kidney disease: Secondary | ICD-10-CM | POA: Diagnosis not present

## 2021-01-29 DIAGNOSIS — Z9181 History of falling: Secondary | ICD-10-CM | POA: Diagnosis not present

## 2021-01-29 DIAGNOSIS — G4730 Sleep apnea, unspecified: Secondary | ICD-10-CM | POA: Diagnosis not present

## 2021-01-29 DIAGNOSIS — G894 Chronic pain syndrome: Secondary | ICD-10-CM | POA: Diagnosis not present

## 2021-01-29 DIAGNOSIS — M5136 Other intervertebral disc degeneration, lumbar region: Secondary | ICD-10-CM | POA: Diagnosis not present

## 2021-01-29 DIAGNOSIS — M479 Spondylosis, unspecified: Secondary | ICD-10-CM | POA: Diagnosis not present

## 2021-01-29 DIAGNOSIS — Z87442 Personal history of urinary calculi: Secondary | ICD-10-CM | POA: Diagnosis not present

## 2021-01-29 DIAGNOSIS — Z87891 Personal history of nicotine dependence: Secondary | ICD-10-CM | POA: Diagnosis not present

## 2021-01-29 DIAGNOSIS — Q6102 Congenital multiple renal cysts: Secondary | ICD-10-CM | POA: Diagnosis not present

## 2021-01-29 DIAGNOSIS — R350 Frequency of micturition: Secondary | ICD-10-CM | POA: Diagnosis not present

## 2021-01-29 DIAGNOSIS — E7800 Pure hypercholesterolemia, unspecified: Secondary | ICD-10-CM | POA: Diagnosis not present

## 2021-01-29 DIAGNOSIS — N390 Urinary tract infection, site not specified: Secondary | ICD-10-CM | POA: Diagnosis not present

## 2021-01-29 DIAGNOSIS — R338 Other retention of urine: Secondary | ICD-10-CM | POA: Diagnosis not present

## 2021-01-30 DIAGNOSIS — R0601 Orthopnea: Secondary | ICD-10-CM | POA: Diagnosis not present

## 2021-01-30 DIAGNOSIS — G894 Chronic pain syndrome: Secondary | ICD-10-CM | POA: Diagnosis not present

## 2021-01-30 DIAGNOSIS — M21371 Foot drop, right foot: Secondary | ICD-10-CM | POA: Diagnosis not present

## 2021-01-30 DIAGNOSIS — Z7409 Other reduced mobility: Secondary | ICD-10-CM | POA: Diagnosis not present

## 2021-01-31 ENCOUNTER — Other Ambulatory Visit: Payer: Self-pay | Admitting: Adult Health

## 2021-01-31 DIAGNOSIS — R338 Other retention of urine: Secondary | ICD-10-CM | POA: Diagnosis not present

## 2021-01-31 DIAGNOSIS — N182 Chronic kidney disease, stage 2 (mild): Secondary | ICD-10-CM | POA: Diagnosis not present

## 2021-01-31 DIAGNOSIS — Z9181 History of falling: Secondary | ICD-10-CM | POA: Diagnosis not present

## 2021-01-31 DIAGNOSIS — M1990 Unspecified osteoarthritis, unspecified site: Secondary | ICD-10-CM | POA: Diagnosis not present

## 2021-01-31 DIAGNOSIS — F32A Depression, unspecified: Secondary | ICD-10-CM | POA: Diagnosis not present

## 2021-01-31 DIAGNOSIS — F331 Major depressive disorder, recurrent, moderate: Secondary | ICD-10-CM

## 2021-01-31 DIAGNOSIS — G894 Chronic pain syndrome: Secondary | ICD-10-CM | POA: Diagnosis not present

## 2021-01-31 DIAGNOSIS — Z792 Long term (current) use of antibiotics: Secondary | ICD-10-CM | POA: Diagnosis not present

## 2021-01-31 DIAGNOSIS — Z87891 Personal history of nicotine dependence: Secondary | ICD-10-CM | POA: Diagnosis not present

## 2021-01-31 DIAGNOSIS — Z87442 Personal history of urinary calculi: Secondary | ICD-10-CM | POA: Diagnosis not present

## 2021-01-31 DIAGNOSIS — M6281 Muscle weakness (generalized): Secondary | ICD-10-CM | POA: Diagnosis not present

## 2021-01-31 DIAGNOSIS — M479 Spondylosis, unspecified: Secondary | ICD-10-CM | POA: Diagnosis not present

## 2021-01-31 DIAGNOSIS — G4730 Sleep apnea, unspecified: Secondary | ICD-10-CM | POA: Diagnosis not present

## 2021-01-31 DIAGNOSIS — G4700 Insomnia, unspecified: Secondary | ICD-10-CM

## 2021-01-31 DIAGNOSIS — M5136 Other intervertebral disc degeneration, lumbar region: Secondary | ICD-10-CM | POA: Diagnosis not present

## 2021-01-31 DIAGNOSIS — Q6102 Congenital multiple renal cysts: Secondary | ICD-10-CM | POA: Diagnosis not present

## 2021-01-31 DIAGNOSIS — R350 Frequency of micturition: Secondary | ICD-10-CM | POA: Diagnosis not present

## 2021-01-31 DIAGNOSIS — F329 Major depressive disorder, single episode, unspecified: Secondary | ICD-10-CM

## 2021-01-31 DIAGNOSIS — I1310 Hypertensive heart and chronic kidney disease without heart failure, with stage 1 through stage 4 chronic kidney disease, or unspecified chronic kidney disease: Secondary | ICD-10-CM | POA: Diagnosis not present

## 2021-01-31 DIAGNOSIS — E7800 Pure hypercholesterolemia, unspecified: Secondary | ICD-10-CM | POA: Diagnosis not present

## 2021-01-31 DIAGNOSIS — N390 Urinary tract infection, site not specified: Secondary | ICD-10-CM | POA: Diagnosis not present

## 2021-02-03 DIAGNOSIS — Z9181 History of falling: Secondary | ICD-10-CM | POA: Diagnosis not present

## 2021-02-03 DIAGNOSIS — Z87442 Personal history of urinary calculi: Secondary | ICD-10-CM | POA: Diagnosis not present

## 2021-02-03 DIAGNOSIS — G4730 Sleep apnea, unspecified: Secondary | ICD-10-CM | POA: Diagnosis not present

## 2021-02-03 DIAGNOSIS — Z87891 Personal history of nicotine dependence: Secondary | ICD-10-CM | POA: Diagnosis not present

## 2021-02-03 DIAGNOSIS — R350 Frequency of micturition: Secondary | ICD-10-CM | POA: Diagnosis not present

## 2021-02-03 DIAGNOSIS — M5136 Other intervertebral disc degeneration, lumbar region: Secondary | ICD-10-CM | POA: Diagnosis not present

## 2021-02-03 DIAGNOSIS — I1310 Hypertensive heart and chronic kidney disease without heart failure, with stage 1 through stage 4 chronic kidney disease, or unspecified chronic kidney disease: Secondary | ICD-10-CM | POA: Diagnosis not present

## 2021-02-03 DIAGNOSIS — Z466 Encounter for fitting and adjustment of urinary device: Secondary | ICD-10-CM | POA: Diagnosis not present

## 2021-02-03 DIAGNOSIS — R338 Other retention of urine: Secondary | ICD-10-CM | POA: Diagnosis not present

## 2021-02-03 DIAGNOSIS — M479 Spondylosis, unspecified: Secondary | ICD-10-CM | POA: Diagnosis not present

## 2021-02-03 DIAGNOSIS — Q6102 Congenital multiple renal cysts: Secondary | ICD-10-CM | POA: Diagnosis not present

## 2021-02-03 DIAGNOSIS — E7800 Pure hypercholesterolemia, unspecified: Secondary | ICD-10-CM | POA: Diagnosis not present

## 2021-02-03 DIAGNOSIS — G894 Chronic pain syndrome: Secondary | ICD-10-CM | POA: Diagnosis not present

## 2021-02-03 DIAGNOSIS — M1990 Unspecified osteoarthritis, unspecified site: Secondary | ICD-10-CM | POA: Diagnosis not present

## 2021-02-03 DIAGNOSIS — N182 Chronic kidney disease, stage 2 (mild): Secondary | ICD-10-CM | POA: Diagnosis not present

## 2021-02-11 DIAGNOSIS — Z87442 Personal history of urinary calculi: Secondary | ICD-10-CM | POA: Diagnosis not present

## 2021-02-11 DIAGNOSIS — Z87891 Personal history of nicotine dependence: Secondary | ICD-10-CM | POA: Diagnosis not present

## 2021-02-11 DIAGNOSIS — Q6102 Congenital multiple renal cysts: Secondary | ICD-10-CM | POA: Diagnosis not present

## 2021-02-11 DIAGNOSIS — R338 Other retention of urine: Secondary | ICD-10-CM | POA: Diagnosis not present

## 2021-02-11 DIAGNOSIS — I1310 Hypertensive heart and chronic kidney disease without heart failure, with stage 1 through stage 4 chronic kidney disease, or unspecified chronic kidney disease: Secondary | ICD-10-CM | POA: Diagnosis not present

## 2021-02-11 DIAGNOSIS — M1990 Unspecified osteoarthritis, unspecified site: Secondary | ICD-10-CM | POA: Diagnosis not present

## 2021-02-11 DIAGNOSIS — N182 Chronic kidney disease, stage 2 (mild): Secondary | ICD-10-CM | POA: Diagnosis not present

## 2021-02-11 DIAGNOSIS — M5136 Other intervertebral disc degeneration, lumbar region: Secondary | ICD-10-CM | POA: Diagnosis not present

## 2021-02-11 DIAGNOSIS — M479 Spondylosis, unspecified: Secondary | ICD-10-CM | POA: Diagnosis not present

## 2021-02-11 DIAGNOSIS — Z466 Encounter for fitting and adjustment of urinary device: Secondary | ICD-10-CM | POA: Diagnosis not present

## 2021-02-11 DIAGNOSIS — G4730 Sleep apnea, unspecified: Secondary | ICD-10-CM | POA: Diagnosis not present

## 2021-02-11 DIAGNOSIS — E7800 Pure hypercholesterolemia, unspecified: Secondary | ICD-10-CM | POA: Diagnosis not present

## 2021-02-11 DIAGNOSIS — Z9181 History of falling: Secondary | ICD-10-CM | POA: Diagnosis not present

## 2021-02-11 DIAGNOSIS — G894 Chronic pain syndrome: Secondary | ICD-10-CM | POA: Diagnosis not present

## 2021-02-11 DIAGNOSIS — R350 Frequency of micturition: Secondary | ICD-10-CM | POA: Diagnosis not present

## 2021-02-15 DIAGNOSIS — Z466 Encounter for fitting and adjustment of urinary device: Secondary | ICD-10-CM | POA: Diagnosis not present

## 2021-02-15 DIAGNOSIS — Z87442 Personal history of urinary calculi: Secondary | ICD-10-CM | POA: Diagnosis not present

## 2021-02-15 DIAGNOSIS — M479 Spondylosis, unspecified: Secondary | ICD-10-CM | POA: Diagnosis not present

## 2021-02-15 DIAGNOSIS — I1310 Hypertensive heart and chronic kidney disease without heart failure, with stage 1 through stage 4 chronic kidney disease, or unspecified chronic kidney disease: Secondary | ICD-10-CM | POA: Diagnosis not present

## 2021-02-15 DIAGNOSIS — Z9181 History of falling: Secondary | ICD-10-CM | POA: Diagnosis not present

## 2021-02-15 DIAGNOSIS — M1990 Unspecified osteoarthritis, unspecified site: Secondary | ICD-10-CM | POA: Diagnosis not present

## 2021-02-15 DIAGNOSIS — G4730 Sleep apnea, unspecified: Secondary | ICD-10-CM | POA: Diagnosis not present

## 2021-02-15 DIAGNOSIS — R350 Frequency of micturition: Secondary | ICD-10-CM | POA: Diagnosis not present

## 2021-02-15 DIAGNOSIS — Z87891 Personal history of nicotine dependence: Secondary | ICD-10-CM | POA: Diagnosis not present

## 2021-02-15 DIAGNOSIS — Q6102 Congenital multiple renal cysts: Secondary | ICD-10-CM | POA: Diagnosis not present

## 2021-02-15 DIAGNOSIS — R338 Other retention of urine: Secondary | ICD-10-CM | POA: Diagnosis not present

## 2021-02-15 DIAGNOSIS — E7800 Pure hypercholesterolemia, unspecified: Secondary | ICD-10-CM | POA: Diagnosis not present

## 2021-02-15 DIAGNOSIS — N182 Chronic kidney disease, stage 2 (mild): Secondary | ICD-10-CM | POA: Diagnosis not present

## 2021-02-15 DIAGNOSIS — G894 Chronic pain syndrome: Secondary | ICD-10-CM | POA: Diagnosis not present

## 2021-02-15 DIAGNOSIS — M5136 Other intervertebral disc degeneration, lumbar region: Secondary | ICD-10-CM | POA: Diagnosis not present

## 2021-02-20 DIAGNOSIS — M1990 Unspecified osteoarthritis, unspecified site: Secondary | ICD-10-CM | POA: Diagnosis not present

## 2021-02-20 DIAGNOSIS — N182 Chronic kidney disease, stage 2 (mild): Secondary | ICD-10-CM | POA: Diagnosis not present

## 2021-02-20 DIAGNOSIS — Z87440 Personal history of urinary (tract) infections: Secondary | ICD-10-CM | POA: Diagnosis not present

## 2021-02-20 DIAGNOSIS — M479 Spondylosis, unspecified: Secondary | ICD-10-CM | POA: Diagnosis not present

## 2021-02-20 DIAGNOSIS — Z9181 History of falling: Secondary | ICD-10-CM | POA: Diagnosis not present

## 2021-02-20 DIAGNOSIS — Z87442 Personal history of urinary calculi: Secondary | ICD-10-CM | POA: Diagnosis not present

## 2021-02-20 DIAGNOSIS — Q6102 Congenital multiple renal cysts: Secondary | ICD-10-CM | POA: Diagnosis not present

## 2021-02-20 DIAGNOSIS — G894 Chronic pain syndrome: Secondary | ICD-10-CM | POA: Diagnosis not present

## 2021-02-20 DIAGNOSIS — G4730 Sleep apnea, unspecified: Secondary | ICD-10-CM | POA: Diagnosis not present

## 2021-02-20 DIAGNOSIS — I1310 Hypertensive heart and chronic kidney disease without heart failure, with stage 1 through stage 4 chronic kidney disease, or unspecified chronic kidney disease: Secondary | ICD-10-CM | POA: Diagnosis not present

## 2021-02-20 DIAGNOSIS — R338 Other retention of urine: Secondary | ICD-10-CM | POA: Diagnosis not present

## 2021-02-20 DIAGNOSIS — E7800 Pure hypercholesterolemia, unspecified: Secondary | ICD-10-CM | POA: Diagnosis not present

## 2021-02-20 DIAGNOSIS — R350 Frequency of micturition: Secondary | ICD-10-CM | POA: Diagnosis not present

## 2021-02-20 DIAGNOSIS — M5136 Other intervertebral disc degeneration, lumbar region: Secondary | ICD-10-CM | POA: Diagnosis not present

## 2021-02-20 DIAGNOSIS — Z466 Encounter for fitting and adjustment of urinary device: Secondary | ICD-10-CM | POA: Diagnosis not present

## 2021-02-20 DIAGNOSIS — Z87891 Personal history of nicotine dependence: Secondary | ICD-10-CM | POA: Diagnosis not present

## 2021-02-22 DIAGNOSIS — N182 Chronic kidney disease, stage 2 (mild): Secondary | ICD-10-CM | POA: Diagnosis not present

## 2021-02-22 DIAGNOSIS — M5136 Other intervertebral disc degeneration, lumbar region: Secondary | ICD-10-CM | POA: Diagnosis not present

## 2021-02-22 DIAGNOSIS — I1310 Hypertensive heart and chronic kidney disease without heart failure, with stage 1 through stage 4 chronic kidney disease, or unspecified chronic kidney disease: Secondary | ICD-10-CM | POA: Diagnosis not present

## 2021-02-22 DIAGNOSIS — Z466 Encounter for fitting and adjustment of urinary device: Secondary | ICD-10-CM | POA: Diagnosis not present

## 2021-02-22 DIAGNOSIS — E7800 Pure hypercholesterolemia, unspecified: Secondary | ICD-10-CM | POA: Diagnosis not present

## 2021-02-22 DIAGNOSIS — G4730 Sleep apnea, unspecified: Secondary | ICD-10-CM | POA: Diagnosis not present

## 2021-02-22 DIAGNOSIS — G894 Chronic pain syndrome: Secondary | ICD-10-CM | POA: Diagnosis not present

## 2021-02-22 DIAGNOSIS — Z87442 Personal history of urinary calculi: Secondary | ICD-10-CM | POA: Diagnosis not present

## 2021-02-22 DIAGNOSIS — Z87891 Personal history of nicotine dependence: Secondary | ICD-10-CM | POA: Diagnosis not present

## 2021-02-22 DIAGNOSIS — M1990 Unspecified osteoarthritis, unspecified site: Secondary | ICD-10-CM | POA: Diagnosis not present

## 2021-02-22 DIAGNOSIS — R338 Other retention of urine: Secondary | ICD-10-CM | POA: Diagnosis not present

## 2021-02-22 DIAGNOSIS — M479 Spondylosis, unspecified: Secondary | ICD-10-CM | POA: Diagnosis not present

## 2021-02-22 DIAGNOSIS — Z9181 History of falling: Secondary | ICD-10-CM | POA: Diagnosis not present

## 2021-02-22 DIAGNOSIS — R350 Frequency of micturition: Secondary | ICD-10-CM | POA: Diagnosis not present

## 2021-02-22 DIAGNOSIS — Q6102 Congenital multiple renal cysts: Secondary | ICD-10-CM | POA: Diagnosis not present

## 2021-02-26 DIAGNOSIS — M5136 Other intervertebral disc degeneration, lumbar region: Secondary | ICD-10-CM | POA: Diagnosis not present

## 2021-02-26 DIAGNOSIS — Z466 Encounter for fitting and adjustment of urinary device: Secondary | ICD-10-CM | POA: Diagnosis not present

## 2021-02-26 DIAGNOSIS — M479 Spondylosis, unspecified: Secondary | ICD-10-CM | POA: Diagnosis not present

## 2021-02-26 DIAGNOSIS — M1990 Unspecified osteoarthritis, unspecified site: Secondary | ICD-10-CM | POA: Diagnosis not present

## 2021-02-28 DIAGNOSIS — Z87891 Personal history of nicotine dependence: Secondary | ICD-10-CM | POA: Diagnosis not present

## 2021-02-28 DIAGNOSIS — Q6102 Congenital multiple renal cysts: Secondary | ICD-10-CM | POA: Diagnosis not present

## 2021-02-28 DIAGNOSIS — N182 Chronic kidney disease, stage 2 (mild): Secondary | ICD-10-CM | POA: Diagnosis not present

## 2021-02-28 DIAGNOSIS — Z466 Encounter for fitting and adjustment of urinary device: Secondary | ICD-10-CM | POA: Diagnosis not present

## 2021-02-28 DIAGNOSIS — Z9181 History of falling: Secondary | ICD-10-CM | POA: Diagnosis not present

## 2021-02-28 DIAGNOSIS — R350 Frequency of micturition: Secondary | ICD-10-CM | POA: Diagnosis not present

## 2021-02-28 DIAGNOSIS — G894 Chronic pain syndrome: Secondary | ICD-10-CM | POA: Diagnosis not present

## 2021-02-28 DIAGNOSIS — G4730 Sleep apnea, unspecified: Secondary | ICD-10-CM | POA: Diagnosis not present

## 2021-02-28 DIAGNOSIS — M479 Spondylosis, unspecified: Secondary | ICD-10-CM | POA: Diagnosis not present

## 2021-02-28 DIAGNOSIS — I1310 Hypertensive heart and chronic kidney disease without heart failure, with stage 1 through stage 4 chronic kidney disease, or unspecified chronic kidney disease: Secondary | ICD-10-CM | POA: Diagnosis not present

## 2021-02-28 DIAGNOSIS — E7800 Pure hypercholesterolemia, unspecified: Secondary | ICD-10-CM | POA: Diagnosis not present

## 2021-02-28 DIAGNOSIS — R338 Other retention of urine: Secondary | ICD-10-CM | POA: Diagnosis not present

## 2021-02-28 DIAGNOSIS — M5136 Other intervertebral disc degeneration, lumbar region: Secondary | ICD-10-CM | POA: Diagnosis not present

## 2021-02-28 DIAGNOSIS — M1990 Unspecified osteoarthritis, unspecified site: Secondary | ICD-10-CM | POA: Diagnosis not present

## 2021-02-28 DIAGNOSIS — Z87442 Personal history of urinary calculi: Secondary | ICD-10-CM | POA: Diagnosis not present

## 2021-03-02 DIAGNOSIS — Z7409 Other reduced mobility: Secondary | ICD-10-CM | POA: Diagnosis not present

## 2021-03-02 DIAGNOSIS — M21371 Foot drop, right foot: Secondary | ICD-10-CM | POA: Diagnosis not present

## 2021-03-02 DIAGNOSIS — G894 Chronic pain syndrome: Secondary | ICD-10-CM | POA: Diagnosis not present

## 2021-03-02 DIAGNOSIS — R0601 Orthopnea: Secondary | ICD-10-CM | POA: Diagnosis not present

## 2021-03-07 ENCOUNTER — Other Ambulatory Visit: Payer: Self-pay | Admitting: Adult Health

## 2021-03-07 DIAGNOSIS — E7800 Pure hypercholesterolemia, unspecified: Secondary | ICD-10-CM | POA: Diagnosis not present

## 2021-03-07 DIAGNOSIS — R350 Frequency of micturition: Secondary | ICD-10-CM | POA: Diagnosis not present

## 2021-03-07 DIAGNOSIS — Z9181 History of falling: Secondary | ICD-10-CM | POA: Diagnosis not present

## 2021-03-07 DIAGNOSIS — N182 Chronic kidney disease, stage 2 (mild): Secondary | ICD-10-CM | POA: Diagnosis not present

## 2021-03-07 DIAGNOSIS — Q6102 Congenital multiple renal cysts: Secondary | ICD-10-CM | POA: Diagnosis not present

## 2021-03-07 DIAGNOSIS — Z466 Encounter for fitting and adjustment of urinary device: Secondary | ICD-10-CM | POA: Diagnosis not present

## 2021-03-07 DIAGNOSIS — R338 Other retention of urine: Secondary | ICD-10-CM | POA: Diagnosis not present

## 2021-03-07 DIAGNOSIS — I1310 Hypertensive heart and chronic kidney disease without heart failure, with stage 1 through stage 4 chronic kidney disease, or unspecified chronic kidney disease: Secondary | ICD-10-CM | POA: Diagnosis not present

## 2021-03-07 DIAGNOSIS — G894 Chronic pain syndrome: Secondary | ICD-10-CM | POA: Diagnosis not present

## 2021-03-07 DIAGNOSIS — G4730 Sleep apnea, unspecified: Secondary | ICD-10-CM | POA: Diagnosis not present

## 2021-03-07 DIAGNOSIS — M479 Spondylosis, unspecified: Secondary | ICD-10-CM | POA: Diagnosis not present

## 2021-03-07 DIAGNOSIS — M5136 Other intervertebral disc degeneration, lumbar region: Secondary | ICD-10-CM | POA: Diagnosis not present

## 2021-03-07 DIAGNOSIS — Z87442 Personal history of urinary calculi: Secondary | ICD-10-CM | POA: Diagnosis not present

## 2021-03-07 DIAGNOSIS — M1990 Unspecified osteoarthritis, unspecified site: Secondary | ICD-10-CM | POA: Diagnosis not present

## 2021-03-07 DIAGNOSIS — F331 Major depressive disorder, recurrent, moderate: Secondary | ICD-10-CM

## 2021-03-07 DIAGNOSIS — G4700 Insomnia, unspecified: Secondary | ICD-10-CM

## 2021-03-07 DIAGNOSIS — Z87891 Personal history of nicotine dependence: Secondary | ICD-10-CM | POA: Diagnosis not present

## 2021-03-07 DIAGNOSIS — F329 Major depressive disorder, single episode, unspecified: Secondary | ICD-10-CM

## 2021-03-13 DIAGNOSIS — Z466 Encounter for fitting and adjustment of urinary device: Secondary | ICD-10-CM | POA: Diagnosis not present

## 2021-03-13 DIAGNOSIS — Q6102 Congenital multiple renal cysts: Secondary | ICD-10-CM | POA: Diagnosis not present

## 2021-03-13 DIAGNOSIS — M5136 Other intervertebral disc degeneration, lumbar region: Secondary | ICD-10-CM | POA: Diagnosis not present

## 2021-03-13 DIAGNOSIS — N182 Chronic kidney disease, stage 2 (mild): Secondary | ICD-10-CM | POA: Diagnosis not present

## 2021-03-13 DIAGNOSIS — Z87442 Personal history of urinary calculi: Secondary | ICD-10-CM | POA: Diagnosis not present

## 2021-03-13 DIAGNOSIS — M479 Spondylosis, unspecified: Secondary | ICD-10-CM | POA: Diagnosis not present

## 2021-03-13 DIAGNOSIS — Z87891 Personal history of nicotine dependence: Secondary | ICD-10-CM | POA: Diagnosis not present

## 2021-03-13 DIAGNOSIS — Z9181 History of falling: Secondary | ICD-10-CM | POA: Diagnosis not present

## 2021-03-13 DIAGNOSIS — G4730 Sleep apnea, unspecified: Secondary | ICD-10-CM | POA: Diagnosis not present

## 2021-03-13 DIAGNOSIS — I1310 Hypertensive heart and chronic kidney disease without heart failure, with stage 1 through stage 4 chronic kidney disease, or unspecified chronic kidney disease: Secondary | ICD-10-CM | POA: Diagnosis not present

## 2021-03-13 DIAGNOSIS — R350 Frequency of micturition: Secondary | ICD-10-CM | POA: Diagnosis not present

## 2021-03-13 DIAGNOSIS — G894 Chronic pain syndrome: Secondary | ICD-10-CM | POA: Diagnosis not present

## 2021-03-13 DIAGNOSIS — M1990 Unspecified osteoarthritis, unspecified site: Secondary | ICD-10-CM | POA: Diagnosis not present

## 2021-03-13 DIAGNOSIS — E7800 Pure hypercholesterolemia, unspecified: Secondary | ICD-10-CM | POA: Diagnosis not present

## 2021-03-13 DIAGNOSIS — R338 Other retention of urine: Secondary | ICD-10-CM | POA: Diagnosis not present

## 2021-03-26 DIAGNOSIS — G2581 Restless legs syndrome: Secondary | ICD-10-CM | POA: Diagnosis not present

## 2021-03-26 DIAGNOSIS — G894 Chronic pain syndrome: Secondary | ICD-10-CM | POA: Diagnosis not present

## 2021-03-26 DIAGNOSIS — G629 Polyneuropathy, unspecified: Secondary | ICD-10-CM | POA: Diagnosis not present

## 2021-03-26 DIAGNOSIS — M5136 Other intervertebral disc degeneration, lumbar region: Secondary | ICD-10-CM | POA: Diagnosis not present

## 2021-03-28 DIAGNOSIS — Z466 Encounter for fitting and adjustment of urinary device: Secondary | ICD-10-CM | POA: Diagnosis not present

## 2021-03-28 DIAGNOSIS — R338 Other retention of urine: Secondary | ICD-10-CM | POA: Diagnosis not present

## 2021-03-28 DIAGNOSIS — M1990 Unspecified osteoarthritis, unspecified site: Secondary | ICD-10-CM | POA: Diagnosis not present

## 2021-03-28 DIAGNOSIS — Q6102 Congenital multiple renal cysts: Secondary | ICD-10-CM | POA: Diagnosis not present

## 2021-03-28 DIAGNOSIS — Z87891 Personal history of nicotine dependence: Secondary | ICD-10-CM | POA: Diagnosis not present

## 2021-03-28 DIAGNOSIS — Z9181 History of falling: Secondary | ICD-10-CM | POA: Diagnosis not present

## 2021-03-28 DIAGNOSIS — I1310 Hypertensive heart and chronic kidney disease without heart failure, with stage 1 through stage 4 chronic kidney disease, or unspecified chronic kidney disease: Secondary | ICD-10-CM | POA: Diagnosis not present

## 2021-03-28 DIAGNOSIS — R350 Frequency of micturition: Secondary | ICD-10-CM | POA: Diagnosis not present

## 2021-03-28 DIAGNOSIS — Z87442 Personal history of urinary calculi: Secondary | ICD-10-CM | POA: Diagnosis not present

## 2021-03-28 DIAGNOSIS — N182 Chronic kidney disease, stage 2 (mild): Secondary | ICD-10-CM | POA: Diagnosis not present

## 2021-03-28 DIAGNOSIS — M479 Spondylosis, unspecified: Secondary | ICD-10-CM | POA: Diagnosis not present

## 2021-03-28 DIAGNOSIS — G4730 Sleep apnea, unspecified: Secondary | ICD-10-CM | POA: Diagnosis not present

## 2021-03-28 DIAGNOSIS — E7800 Pure hypercholesterolemia, unspecified: Secondary | ICD-10-CM | POA: Diagnosis not present

## 2021-03-28 DIAGNOSIS — M5136 Other intervertebral disc degeneration, lumbar region: Secondary | ICD-10-CM | POA: Diagnosis not present

## 2021-03-28 DIAGNOSIS — G894 Chronic pain syndrome: Secondary | ICD-10-CM | POA: Diagnosis not present

## 2021-03-31 DIAGNOSIS — M1990 Unspecified osteoarthritis, unspecified site: Secondary | ICD-10-CM | POA: Diagnosis not present

## 2021-03-31 DIAGNOSIS — Z9181 History of falling: Secondary | ICD-10-CM | POA: Diagnosis not present

## 2021-03-31 DIAGNOSIS — G4730 Sleep apnea, unspecified: Secondary | ICD-10-CM | POA: Diagnosis not present

## 2021-03-31 DIAGNOSIS — Z87891 Personal history of nicotine dependence: Secondary | ICD-10-CM | POA: Diagnosis not present

## 2021-03-31 DIAGNOSIS — E7800 Pure hypercholesterolemia, unspecified: Secondary | ICD-10-CM | POA: Diagnosis not present

## 2021-03-31 DIAGNOSIS — R350 Frequency of micturition: Secondary | ICD-10-CM | POA: Diagnosis not present

## 2021-03-31 DIAGNOSIS — G894 Chronic pain syndrome: Secondary | ICD-10-CM | POA: Diagnosis not present

## 2021-03-31 DIAGNOSIS — Z87442 Personal history of urinary calculi: Secondary | ICD-10-CM | POA: Diagnosis not present

## 2021-03-31 DIAGNOSIS — M5136 Other intervertebral disc degeneration, lumbar region: Secondary | ICD-10-CM | POA: Diagnosis not present

## 2021-03-31 DIAGNOSIS — Q6102 Congenital multiple renal cysts: Secondary | ICD-10-CM | POA: Diagnosis not present

## 2021-03-31 DIAGNOSIS — I1310 Hypertensive heart and chronic kidney disease without heart failure, with stage 1 through stage 4 chronic kidney disease, or unspecified chronic kidney disease: Secondary | ICD-10-CM | POA: Diagnosis not present

## 2021-03-31 DIAGNOSIS — M479 Spondylosis, unspecified: Secondary | ICD-10-CM | POA: Diagnosis not present

## 2021-03-31 DIAGNOSIS — R338 Other retention of urine: Secondary | ICD-10-CM | POA: Diagnosis not present

## 2021-03-31 DIAGNOSIS — N182 Chronic kidney disease, stage 2 (mild): Secondary | ICD-10-CM | POA: Diagnosis not present

## 2021-03-31 DIAGNOSIS — Z466 Encounter for fitting and adjustment of urinary device: Secondary | ICD-10-CM | POA: Diagnosis not present

## 2021-04-02 DIAGNOSIS — Z87442 Personal history of urinary calculi: Secondary | ICD-10-CM | POA: Diagnosis not present

## 2021-04-02 DIAGNOSIS — M5136 Other intervertebral disc degeneration, lumbar region: Secondary | ICD-10-CM | POA: Diagnosis not present

## 2021-04-02 DIAGNOSIS — M21371 Foot drop, right foot: Secondary | ICD-10-CM | POA: Diagnosis not present

## 2021-04-02 DIAGNOSIS — G4730 Sleep apnea, unspecified: Secondary | ICD-10-CM | POA: Diagnosis not present

## 2021-04-02 DIAGNOSIS — N182 Chronic kidney disease, stage 2 (mild): Secondary | ICD-10-CM | POA: Diagnosis not present

## 2021-04-02 DIAGNOSIS — M479 Spondylosis, unspecified: Secondary | ICD-10-CM | POA: Diagnosis not present

## 2021-04-02 DIAGNOSIS — Z9181 History of falling: Secondary | ICD-10-CM | POA: Diagnosis not present

## 2021-04-02 DIAGNOSIS — E7800 Pure hypercholesterolemia, unspecified: Secondary | ICD-10-CM | POA: Diagnosis not present

## 2021-04-02 DIAGNOSIS — M1990 Unspecified osteoarthritis, unspecified site: Secondary | ICD-10-CM | POA: Diagnosis not present

## 2021-04-02 DIAGNOSIS — R0601 Orthopnea: Secondary | ICD-10-CM | POA: Diagnosis not present

## 2021-04-02 DIAGNOSIS — G894 Chronic pain syndrome: Secondary | ICD-10-CM | POA: Diagnosis not present

## 2021-04-02 DIAGNOSIS — R350 Frequency of micturition: Secondary | ICD-10-CM | POA: Diagnosis not present

## 2021-04-02 DIAGNOSIS — Z466 Encounter for fitting and adjustment of urinary device: Secondary | ICD-10-CM | POA: Diagnosis not present

## 2021-04-02 DIAGNOSIS — R338 Other retention of urine: Secondary | ICD-10-CM | POA: Diagnosis not present

## 2021-04-02 DIAGNOSIS — Q6102 Congenital multiple renal cysts: Secondary | ICD-10-CM | POA: Diagnosis not present

## 2021-04-02 DIAGNOSIS — Z87891 Personal history of nicotine dependence: Secondary | ICD-10-CM | POA: Diagnosis not present

## 2021-04-02 DIAGNOSIS — Z7409 Other reduced mobility: Secondary | ICD-10-CM | POA: Diagnosis not present

## 2021-04-02 DIAGNOSIS — I1310 Hypertensive heart and chronic kidney disease without heart failure, with stage 1 through stage 4 chronic kidney disease, or unspecified chronic kidney disease: Secondary | ICD-10-CM | POA: Diagnosis not present

## 2021-04-06 DIAGNOSIS — Q6102 Congenital multiple renal cysts: Secondary | ICD-10-CM | POA: Diagnosis not present

## 2021-04-06 DIAGNOSIS — G4730 Sleep apnea, unspecified: Secondary | ICD-10-CM | POA: Diagnosis not present

## 2021-04-06 DIAGNOSIS — Z87891 Personal history of nicotine dependence: Secondary | ICD-10-CM | POA: Diagnosis not present

## 2021-04-06 DIAGNOSIS — Z466 Encounter for fitting and adjustment of urinary device: Secondary | ICD-10-CM | POA: Diagnosis not present

## 2021-04-06 DIAGNOSIS — N182 Chronic kidney disease, stage 2 (mild): Secondary | ICD-10-CM | POA: Diagnosis not present

## 2021-04-06 DIAGNOSIS — I1310 Hypertensive heart and chronic kidney disease without heart failure, with stage 1 through stage 4 chronic kidney disease, or unspecified chronic kidney disease: Secondary | ICD-10-CM | POA: Diagnosis not present

## 2021-04-06 DIAGNOSIS — Z87442 Personal history of urinary calculi: Secondary | ICD-10-CM | POA: Diagnosis not present

## 2021-04-06 DIAGNOSIS — Z9181 History of falling: Secondary | ICD-10-CM | POA: Diagnosis not present

## 2021-04-06 DIAGNOSIS — E7800 Pure hypercholesterolemia, unspecified: Secondary | ICD-10-CM | POA: Diagnosis not present

## 2021-04-06 DIAGNOSIS — M479 Spondylosis, unspecified: Secondary | ICD-10-CM | POA: Diagnosis not present

## 2021-04-06 DIAGNOSIS — R338 Other retention of urine: Secondary | ICD-10-CM | POA: Diagnosis not present

## 2021-04-06 DIAGNOSIS — R350 Frequency of micturition: Secondary | ICD-10-CM | POA: Diagnosis not present

## 2021-04-06 DIAGNOSIS — G894 Chronic pain syndrome: Secondary | ICD-10-CM | POA: Diagnosis not present

## 2021-04-06 DIAGNOSIS — M1990 Unspecified osteoarthritis, unspecified site: Secondary | ICD-10-CM | POA: Diagnosis not present

## 2021-04-06 DIAGNOSIS — M5136 Other intervertebral disc degeneration, lumbar region: Secondary | ICD-10-CM | POA: Diagnosis not present

## 2021-04-17 DIAGNOSIS — Z466 Encounter for fitting and adjustment of urinary device: Secondary | ICD-10-CM | POA: Diagnosis not present

## 2021-04-17 DIAGNOSIS — R338 Other retention of urine: Secondary | ICD-10-CM | POA: Diagnosis not present

## 2021-04-17 DIAGNOSIS — M5136 Other intervertebral disc degeneration, lumbar region: Secondary | ICD-10-CM | POA: Diagnosis not present

## 2021-04-17 DIAGNOSIS — M479 Spondylosis, unspecified: Secondary | ICD-10-CM | POA: Diagnosis not present

## 2021-04-17 DIAGNOSIS — Q6102 Congenital multiple renal cysts: Secondary | ICD-10-CM | POA: Diagnosis not present

## 2021-04-17 DIAGNOSIS — G4730 Sleep apnea, unspecified: Secondary | ICD-10-CM | POA: Diagnosis not present

## 2021-04-17 DIAGNOSIS — G894 Chronic pain syndrome: Secondary | ICD-10-CM | POA: Diagnosis not present

## 2021-04-17 DIAGNOSIS — Z9181 History of falling: Secondary | ICD-10-CM | POA: Diagnosis not present

## 2021-04-17 DIAGNOSIS — E7800 Pure hypercholesterolemia, unspecified: Secondary | ICD-10-CM | POA: Diagnosis not present

## 2021-04-17 DIAGNOSIS — Z87442 Personal history of urinary calculi: Secondary | ICD-10-CM | POA: Diagnosis not present

## 2021-04-17 DIAGNOSIS — I1310 Hypertensive heart and chronic kidney disease without heart failure, with stage 1 through stage 4 chronic kidney disease, or unspecified chronic kidney disease: Secondary | ICD-10-CM | POA: Diagnosis not present

## 2021-04-17 DIAGNOSIS — Z87891 Personal history of nicotine dependence: Secondary | ICD-10-CM | POA: Diagnosis not present

## 2021-04-17 DIAGNOSIS — M1990 Unspecified osteoarthritis, unspecified site: Secondary | ICD-10-CM | POA: Diagnosis not present

## 2021-04-17 DIAGNOSIS — N182 Chronic kidney disease, stage 2 (mild): Secondary | ICD-10-CM | POA: Diagnosis not present

## 2021-04-17 DIAGNOSIS — R350 Frequency of micturition: Secondary | ICD-10-CM | POA: Diagnosis not present

## 2021-04-23 DIAGNOSIS — G4730 Sleep apnea, unspecified: Secondary | ICD-10-CM | POA: Diagnosis not present

## 2021-04-23 DIAGNOSIS — M479 Spondylosis, unspecified: Secondary | ICD-10-CM | POA: Diagnosis not present

## 2021-04-23 DIAGNOSIS — Z466 Encounter for fitting and adjustment of urinary device: Secondary | ICD-10-CM | POA: Diagnosis not present

## 2021-04-23 DIAGNOSIS — Z9181 History of falling: Secondary | ICD-10-CM | POA: Diagnosis not present

## 2021-04-23 DIAGNOSIS — M5136 Other intervertebral disc degeneration, lumbar region: Secondary | ICD-10-CM | POA: Diagnosis not present

## 2021-04-23 DIAGNOSIS — Q6102 Congenital multiple renal cysts: Secondary | ICD-10-CM | POA: Diagnosis not present

## 2021-04-23 DIAGNOSIS — R338 Other retention of urine: Secondary | ICD-10-CM | POA: Diagnosis not present

## 2021-04-23 DIAGNOSIS — G894 Chronic pain syndrome: Secondary | ICD-10-CM | POA: Diagnosis not present

## 2021-04-23 DIAGNOSIS — Z87442 Personal history of urinary calculi: Secondary | ICD-10-CM | POA: Diagnosis not present

## 2021-04-23 DIAGNOSIS — Z87891 Personal history of nicotine dependence: Secondary | ICD-10-CM | POA: Diagnosis not present

## 2021-04-23 DIAGNOSIS — N182 Chronic kidney disease, stage 2 (mild): Secondary | ICD-10-CM | POA: Diagnosis not present

## 2021-04-23 DIAGNOSIS — E7800 Pure hypercholesterolemia, unspecified: Secondary | ICD-10-CM | POA: Diagnosis not present

## 2021-04-23 DIAGNOSIS — R350 Frequency of micturition: Secondary | ICD-10-CM | POA: Diagnosis not present

## 2021-04-23 DIAGNOSIS — M1990 Unspecified osteoarthritis, unspecified site: Secondary | ICD-10-CM | POA: Diagnosis not present

## 2021-04-23 DIAGNOSIS — I1310 Hypertensive heart and chronic kidney disease without heart failure, with stage 1 through stage 4 chronic kidney disease, or unspecified chronic kidney disease: Secondary | ICD-10-CM | POA: Diagnosis not present

## 2021-04-29 DIAGNOSIS — M5136 Other intervertebral disc degeneration, lumbar region: Secondary | ICD-10-CM | POA: Diagnosis not present

## 2021-04-29 DIAGNOSIS — Z9181 History of falling: Secondary | ICD-10-CM | POA: Diagnosis not present

## 2021-04-29 DIAGNOSIS — Z466 Encounter for fitting and adjustment of urinary device: Secondary | ICD-10-CM | POA: Diagnosis not present

## 2021-04-29 DIAGNOSIS — Z87891 Personal history of nicotine dependence: Secondary | ICD-10-CM | POA: Diagnosis not present

## 2021-04-29 DIAGNOSIS — Z87442 Personal history of urinary calculi: Secondary | ICD-10-CM | POA: Diagnosis not present

## 2021-04-29 DIAGNOSIS — G894 Chronic pain syndrome: Secondary | ICD-10-CM | POA: Diagnosis not present

## 2021-04-29 DIAGNOSIS — G4730 Sleep apnea, unspecified: Secondary | ICD-10-CM | POA: Diagnosis not present

## 2021-04-29 DIAGNOSIS — I1310 Hypertensive heart and chronic kidney disease without heart failure, with stage 1 through stage 4 chronic kidney disease, or unspecified chronic kidney disease: Secondary | ICD-10-CM | POA: Diagnosis not present

## 2021-04-29 DIAGNOSIS — R350 Frequency of micturition: Secondary | ICD-10-CM | POA: Diagnosis not present

## 2021-04-29 DIAGNOSIS — N182 Chronic kidney disease, stage 2 (mild): Secondary | ICD-10-CM | POA: Diagnosis not present

## 2021-04-29 DIAGNOSIS — E7800 Pure hypercholesterolemia, unspecified: Secondary | ICD-10-CM | POA: Diagnosis not present

## 2021-04-29 DIAGNOSIS — M1990 Unspecified osteoarthritis, unspecified site: Secondary | ICD-10-CM | POA: Diagnosis not present

## 2021-04-29 DIAGNOSIS — Q6102 Congenital multiple renal cysts: Secondary | ICD-10-CM | POA: Diagnosis not present

## 2021-04-29 DIAGNOSIS — M479 Spondylosis, unspecified: Secondary | ICD-10-CM | POA: Diagnosis not present

## 2021-04-29 DIAGNOSIS — R338 Other retention of urine: Secondary | ICD-10-CM | POA: Diagnosis not present

## 2021-04-30 DIAGNOSIS — G894 Chronic pain syndrome: Secondary | ICD-10-CM | POA: Diagnosis not present

## 2021-04-30 DIAGNOSIS — R0601 Orthopnea: Secondary | ICD-10-CM | POA: Diagnosis not present

## 2021-04-30 DIAGNOSIS — M21371 Foot drop, right foot: Secondary | ICD-10-CM | POA: Diagnosis not present

## 2021-05-05 DIAGNOSIS — R338 Other retention of urine: Secondary | ICD-10-CM | POA: Diagnosis not present

## 2021-05-06 DIAGNOSIS — Z87442 Personal history of urinary calculi: Secondary | ICD-10-CM | POA: Diagnosis not present

## 2021-05-06 DIAGNOSIS — N182 Chronic kidney disease, stage 2 (mild): Secondary | ICD-10-CM | POA: Diagnosis not present

## 2021-05-06 DIAGNOSIS — R338 Other retention of urine: Secondary | ICD-10-CM | POA: Diagnosis not present

## 2021-05-06 DIAGNOSIS — R350 Frequency of micturition: Secondary | ICD-10-CM | POA: Diagnosis not present

## 2021-05-06 DIAGNOSIS — Z9181 History of falling: Secondary | ICD-10-CM | POA: Diagnosis not present

## 2021-05-06 DIAGNOSIS — Q6102 Congenital multiple renal cysts: Secondary | ICD-10-CM | POA: Diagnosis not present

## 2021-05-06 DIAGNOSIS — I1310 Hypertensive heart and chronic kidney disease without heart failure, with stage 1 through stage 4 chronic kidney disease, or unspecified chronic kidney disease: Secondary | ICD-10-CM | POA: Diagnosis not present

## 2021-05-06 DIAGNOSIS — M5136 Other intervertebral disc degeneration, lumbar region: Secondary | ICD-10-CM | POA: Diagnosis not present

## 2021-05-06 DIAGNOSIS — Z466 Encounter for fitting and adjustment of urinary device: Secondary | ICD-10-CM | POA: Diagnosis not present

## 2021-05-06 DIAGNOSIS — M1990 Unspecified osteoarthritis, unspecified site: Secondary | ICD-10-CM | POA: Diagnosis not present

## 2021-05-06 DIAGNOSIS — E7800 Pure hypercholesterolemia, unspecified: Secondary | ICD-10-CM | POA: Diagnosis not present

## 2021-05-06 DIAGNOSIS — G4730 Sleep apnea, unspecified: Secondary | ICD-10-CM | POA: Diagnosis not present

## 2021-05-06 DIAGNOSIS — Z87891 Personal history of nicotine dependence: Secondary | ICD-10-CM | POA: Diagnosis not present

## 2021-05-06 DIAGNOSIS — M479 Spondylosis, unspecified: Secondary | ICD-10-CM | POA: Diagnosis not present

## 2021-05-06 DIAGNOSIS — G894 Chronic pain syndrome: Secondary | ICD-10-CM | POA: Diagnosis not present

## 2021-05-07 DIAGNOSIS — M5136 Other intervertebral disc degeneration, lumbar region: Secondary | ICD-10-CM | POA: Diagnosis not present

## 2021-05-07 DIAGNOSIS — Z9181 History of falling: Secondary | ICD-10-CM | POA: Diagnosis not present

## 2021-05-07 DIAGNOSIS — I1310 Hypertensive heart and chronic kidney disease without heart failure, with stage 1 through stage 4 chronic kidney disease, or unspecified chronic kidney disease: Secondary | ICD-10-CM | POA: Diagnosis not present

## 2021-05-07 DIAGNOSIS — Z87442 Personal history of urinary calculi: Secondary | ICD-10-CM | POA: Diagnosis not present

## 2021-05-07 DIAGNOSIS — G894 Chronic pain syndrome: Secondary | ICD-10-CM | POA: Diagnosis not present

## 2021-05-07 DIAGNOSIS — Z466 Encounter for fitting and adjustment of urinary device: Secondary | ICD-10-CM | POA: Diagnosis not present

## 2021-05-07 DIAGNOSIS — Z87891 Personal history of nicotine dependence: Secondary | ICD-10-CM | POA: Diagnosis not present

## 2021-05-07 DIAGNOSIS — M1990 Unspecified osteoarthritis, unspecified site: Secondary | ICD-10-CM | POA: Diagnosis not present

## 2021-05-07 DIAGNOSIS — E7800 Pure hypercholesterolemia, unspecified: Secondary | ICD-10-CM | POA: Diagnosis not present

## 2021-05-07 DIAGNOSIS — M479 Spondylosis, unspecified: Secondary | ICD-10-CM | POA: Diagnosis not present

## 2021-05-07 DIAGNOSIS — G4730 Sleep apnea, unspecified: Secondary | ICD-10-CM | POA: Diagnosis not present

## 2021-05-07 DIAGNOSIS — R338 Other retention of urine: Secondary | ICD-10-CM | POA: Diagnosis not present

## 2021-05-07 DIAGNOSIS — R350 Frequency of micturition: Secondary | ICD-10-CM | POA: Diagnosis not present

## 2021-05-07 DIAGNOSIS — Q6102 Congenital multiple renal cysts: Secondary | ICD-10-CM | POA: Diagnosis not present

## 2021-05-07 DIAGNOSIS — N182 Chronic kidney disease, stage 2 (mild): Secondary | ICD-10-CM | POA: Diagnosis not present

## 2021-05-09 DIAGNOSIS — I10 Essential (primary) hypertension: Secondary | ICD-10-CM | POA: Diagnosis not present

## 2021-05-14 DIAGNOSIS — M479 Spondylosis, unspecified: Secondary | ICD-10-CM | POA: Diagnosis not present

## 2021-05-14 DIAGNOSIS — Z87891 Personal history of nicotine dependence: Secondary | ICD-10-CM | POA: Diagnosis not present

## 2021-05-14 DIAGNOSIS — R338 Other retention of urine: Secondary | ICD-10-CM | POA: Diagnosis not present

## 2021-05-14 DIAGNOSIS — M1990 Unspecified osteoarthritis, unspecified site: Secondary | ICD-10-CM | POA: Diagnosis not present

## 2021-05-14 DIAGNOSIS — Z9181 History of falling: Secondary | ICD-10-CM | POA: Diagnosis not present

## 2021-05-14 DIAGNOSIS — G4730 Sleep apnea, unspecified: Secondary | ICD-10-CM | POA: Diagnosis not present

## 2021-05-14 DIAGNOSIS — Z87442 Personal history of urinary calculi: Secondary | ICD-10-CM | POA: Diagnosis not present

## 2021-05-14 DIAGNOSIS — I1310 Hypertensive heart and chronic kidney disease without heart failure, with stage 1 through stage 4 chronic kidney disease, or unspecified chronic kidney disease: Secondary | ICD-10-CM | POA: Diagnosis not present

## 2021-05-14 DIAGNOSIS — N182 Chronic kidney disease, stage 2 (mild): Secondary | ICD-10-CM | POA: Diagnosis not present

## 2021-05-14 DIAGNOSIS — G894 Chronic pain syndrome: Secondary | ICD-10-CM | POA: Diagnosis not present

## 2021-05-14 DIAGNOSIS — E7800 Pure hypercholesterolemia, unspecified: Secondary | ICD-10-CM | POA: Diagnosis not present

## 2021-05-14 DIAGNOSIS — M5136 Other intervertebral disc degeneration, lumbar region: Secondary | ICD-10-CM | POA: Diagnosis not present

## 2021-05-14 DIAGNOSIS — Q6102 Congenital multiple renal cysts: Secondary | ICD-10-CM | POA: Diagnosis not present

## 2021-05-14 DIAGNOSIS — R350 Frequency of micturition: Secondary | ICD-10-CM | POA: Diagnosis not present

## 2021-05-14 DIAGNOSIS — Z466 Encounter for fitting and adjustment of urinary device: Secondary | ICD-10-CM | POA: Diagnosis not present

## 2021-05-23 DIAGNOSIS — I1310 Hypertensive heart and chronic kidney disease without heart failure, with stage 1 through stage 4 chronic kidney disease, or unspecified chronic kidney disease: Secondary | ICD-10-CM | POA: Diagnosis not present

## 2021-05-23 DIAGNOSIS — G894 Chronic pain syndrome: Secondary | ICD-10-CM | POA: Diagnosis not present

## 2021-05-23 DIAGNOSIS — Z87442 Personal history of urinary calculi: Secondary | ICD-10-CM | POA: Diagnosis not present

## 2021-05-23 DIAGNOSIS — Z9181 History of falling: Secondary | ICD-10-CM | POA: Diagnosis not present

## 2021-05-23 DIAGNOSIS — Z466 Encounter for fitting and adjustment of urinary device: Secondary | ICD-10-CM | POA: Diagnosis not present

## 2021-05-23 DIAGNOSIS — Z87891 Personal history of nicotine dependence: Secondary | ICD-10-CM | POA: Diagnosis not present

## 2021-05-23 DIAGNOSIS — E7800 Pure hypercholesterolemia, unspecified: Secondary | ICD-10-CM | POA: Diagnosis not present

## 2021-05-23 DIAGNOSIS — G4730 Sleep apnea, unspecified: Secondary | ICD-10-CM | POA: Diagnosis not present

## 2021-05-23 DIAGNOSIS — R350 Frequency of micturition: Secondary | ICD-10-CM | POA: Diagnosis not present

## 2021-05-23 DIAGNOSIS — M479 Spondylosis, unspecified: Secondary | ICD-10-CM | POA: Diagnosis not present

## 2021-05-23 DIAGNOSIS — N182 Chronic kidney disease, stage 2 (mild): Secondary | ICD-10-CM | POA: Diagnosis not present

## 2021-05-23 DIAGNOSIS — M5136 Other intervertebral disc degeneration, lumbar region: Secondary | ICD-10-CM | POA: Diagnosis not present

## 2021-05-23 DIAGNOSIS — M1990 Unspecified osteoarthritis, unspecified site: Secondary | ICD-10-CM | POA: Diagnosis not present

## 2021-05-23 DIAGNOSIS — Q6102 Congenital multiple renal cysts: Secondary | ICD-10-CM | POA: Diagnosis not present

## 2021-05-23 DIAGNOSIS — R338 Other retention of urine: Secondary | ICD-10-CM | POA: Diagnosis not present

## 2021-05-28 ENCOUNTER — Other Ambulatory Visit: Payer: Self-pay | Admitting: Adult Health

## 2021-05-28 DIAGNOSIS — G894 Chronic pain syndrome: Secondary | ICD-10-CM | POA: Diagnosis not present

## 2021-05-28 DIAGNOSIS — F329 Major depressive disorder, single episode, unspecified: Secondary | ICD-10-CM

## 2021-05-28 DIAGNOSIS — M5136 Other intervertebral disc degeneration, lumbar region: Secondary | ICD-10-CM | POA: Diagnosis not present

## 2021-05-28 DIAGNOSIS — R338 Other retention of urine: Secondary | ICD-10-CM | POA: Diagnosis not present

## 2021-05-28 DIAGNOSIS — N182 Chronic kidney disease, stage 2 (mild): Secondary | ICD-10-CM | POA: Diagnosis not present

## 2021-05-28 DIAGNOSIS — G4700 Insomnia, unspecified: Secondary | ICD-10-CM

## 2021-05-28 DIAGNOSIS — Z466 Encounter for fitting and adjustment of urinary device: Secondary | ICD-10-CM | POA: Diagnosis not present

## 2021-05-28 DIAGNOSIS — G629 Polyneuropathy, unspecified: Secondary | ICD-10-CM | POA: Diagnosis not present

## 2021-05-28 DIAGNOSIS — M479 Spondylosis, unspecified: Secondary | ICD-10-CM | POA: Diagnosis not present

## 2021-05-28 DIAGNOSIS — M1990 Unspecified osteoarthritis, unspecified site: Secondary | ICD-10-CM | POA: Diagnosis not present

## 2021-05-28 DIAGNOSIS — G4730 Sleep apnea, unspecified: Secondary | ICD-10-CM | POA: Diagnosis not present

## 2021-05-28 DIAGNOSIS — G2581 Restless legs syndrome: Secondary | ICD-10-CM | POA: Diagnosis not present

## 2021-05-28 DIAGNOSIS — Z87891 Personal history of nicotine dependence: Secondary | ICD-10-CM | POA: Diagnosis not present

## 2021-05-28 DIAGNOSIS — R350 Frequency of micturition: Secondary | ICD-10-CM | POA: Diagnosis not present

## 2021-05-28 DIAGNOSIS — Q6102 Congenital multiple renal cysts: Secondary | ICD-10-CM | POA: Diagnosis not present

## 2021-05-28 DIAGNOSIS — Z87442 Personal history of urinary calculi: Secondary | ICD-10-CM | POA: Diagnosis not present

## 2021-05-28 DIAGNOSIS — Z9181 History of falling: Secondary | ICD-10-CM | POA: Diagnosis not present

## 2021-05-28 DIAGNOSIS — I1310 Hypertensive heart and chronic kidney disease without heart failure, with stage 1 through stage 4 chronic kidney disease, or unspecified chronic kidney disease: Secondary | ICD-10-CM | POA: Diagnosis not present

## 2021-05-28 DIAGNOSIS — F331 Major depressive disorder, recurrent, moderate: Secondary | ICD-10-CM

## 2021-05-28 DIAGNOSIS — E7800 Pure hypercholesterolemia, unspecified: Secondary | ICD-10-CM | POA: Diagnosis not present

## 2021-06-03 DIAGNOSIS — M479 Spondylosis, unspecified: Secondary | ICD-10-CM | POA: Diagnosis not present

## 2021-06-03 DIAGNOSIS — G4730 Sleep apnea, unspecified: Secondary | ICD-10-CM | POA: Diagnosis not present

## 2021-06-03 DIAGNOSIS — E7800 Pure hypercholesterolemia, unspecified: Secondary | ICD-10-CM | POA: Diagnosis not present

## 2021-06-03 DIAGNOSIS — R350 Frequency of micturition: Secondary | ICD-10-CM | POA: Diagnosis not present

## 2021-06-03 DIAGNOSIS — Z466 Encounter for fitting and adjustment of urinary device: Secondary | ICD-10-CM | POA: Diagnosis not present

## 2021-06-03 DIAGNOSIS — I1310 Hypertensive heart and chronic kidney disease without heart failure, with stage 1 through stage 4 chronic kidney disease, or unspecified chronic kidney disease: Secondary | ICD-10-CM | POA: Diagnosis not present

## 2021-06-03 DIAGNOSIS — M5136 Other intervertebral disc degeneration, lumbar region: Secondary | ICD-10-CM | POA: Diagnosis not present

## 2021-06-03 DIAGNOSIS — R338 Other retention of urine: Secondary | ICD-10-CM | POA: Diagnosis not present

## 2021-06-03 DIAGNOSIS — N182 Chronic kidney disease, stage 2 (mild): Secondary | ICD-10-CM | POA: Diagnosis not present

## 2021-06-03 DIAGNOSIS — Z9181 History of falling: Secondary | ICD-10-CM | POA: Diagnosis not present

## 2021-06-03 DIAGNOSIS — G894 Chronic pain syndrome: Secondary | ICD-10-CM | POA: Diagnosis not present

## 2021-06-03 DIAGNOSIS — Z87891 Personal history of nicotine dependence: Secondary | ICD-10-CM | POA: Diagnosis not present

## 2021-06-03 DIAGNOSIS — Z87442 Personal history of urinary calculi: Secondary | ICD-10-CM | POA: Diagnosis not present

## 2021-06-03 DIAGNOSIS — Q6102 Congenital multiple renal cysts: Secondary | ICD-10-CM | POA: Diagnosis not present

## 2021-06-03 DIAGNOSIS — M1990 Unspecified osteoarthritis, unspecified site: Secondary | ICD-10-CM | POA: Diagnosis not present

## 2021-06-06 DIAGNOSIS — E7800 Pure hypercholesterolemia, unspecified: Secondary | ICD-10-CM | POA: Diagnosis not present

## 2021-06-06 DIAGNOSIS — R338 Other retention of urine: Secondary | ICD-10-CM | POA: Diagnosis not present

## 2021-06-06 DIAGNOSIS — M479 Spondylosis, unspecified: Secondary | ICD-10-CM | POA: Diagnosis not present

## 2021-06-06 DIAGNOSIS — Z87442 Personal history of urinary calculi: Secondary | ICD-10-CM | POA: Diagnosis not present

## 2021-06-06 DIAGNOSIS — M5136 Other intervertebral disc degeneration, lumbar region: Secondary | ICD-10-CM | POA: Diagnosis not present

## 2021-06-06 DIAGNOSIS — I1310 Hypertensive heart and chronic kidney disease without heart failure, with stage 1 through stage 4 chronic kidney disease, or unspecified chronic kidney disease: Secondary | ICD-10-CM | POA: Diagnosis not present

## 2021-06-06 DIAGNOSIS — M1990 Unspecified osteoarthritis, unspecified site: Secondary | ICD-10-CM | POA: Diagnosis not present

## 2021-06-06 DIAGNOSIS — Q6102 Congenital multiple renal cysts: Secondary | ICD-10-CM | POA: Diagnosis not present

## 2021-06-06 DIAGNOSIS — Z87891 Personal history of nicotine dependence: Secondary | ICD-10-CM | POA: Diagnosis not present

## 2021-06-06 DIAGNOSIS — Z466 Encounter for fitting and adjustment of urinary device: Secondary | ICD-10-CM | POA: Diagnosis not present

## 2021-06-06 DIAGNOSIS — Z9181 History of falling: Secondary | ICD-10-CM | POA: Diagnosis not present

## 2021-06-06 DIAGNOSIS — G894 Chronic pain syndrome: Secondary | ICD-10-CM | POA: Diagnosis not present

## 2021-06-06 DIAGNOSIS — G4730 Sleep apnea, unspecified: Secondary | ICD-10-CM | POA: Diagnosis not present

## 2021-06-06 DIAGNOSIS — N182 Chronic kidney disease, stage 2 (mild): Secondary | ICD-10-CM | POA: Diagnosis not present

## 2021-06-06 DIAGNOSIS — R350 Frequency of micturition: Secondary | ICD-10-CM | POA: Diagnosis not present

## 2021-06-08 DIAGNOSIS — I10 Essential (primary) hypertension: Secondary | ICD-10-CM | POA: Diagnosis not present

## 2021-06-11 DIAGNOSIS — G894 Chronic pain syndrome: Secondary | ICD-10-CM | POA: Diagnosis not present

## 2021-06-17 DIAGNOSIS — R338 Other retention of urine: Secondary | ICD-10-CM | POA: Diagnosis not present

## 2021-06-17 DIAGNOSIS — Z466 Encounter for fitting and adjustment of urinary device: Secondary | ICD-10-CM | POA: Diagnosis not present

## 2021-06-18 DIAGNOSIS — G894 Chronic pain syndrome: Secondary | ICD-10-CM | POA: Diagnosis not present

## 2021-06-18 DIAGNOSIS — I1310 Hypertensive heart and chronic kidney disease without heart failure, with stage 1 through stage 4 chronic kidney disease, or unspecified chronic kidney disease: Secondary | ICD-10-CM | POA: Diagnosis not present

## 2021-06-18 DIAGNOSIS — R350 Frequency of micturition: Secondary | ICD-10-CM | POA: Diagnosis not present

## 2021-06-18 DIAGNOSIS — M479 Spondylosis, unspecified: Secondary | ICD-10-CM | POA: Diagnosis not present

## 2021-06-18 DIAGNOSIS — M5136 Other intervertebral disc degeneration, lumbar region: Secondary | ICD-10-CM | POA: Diagnosis not present

## 2021-06-18 DIAGNOSIS — Z87891 Personal history of nicotine dependence: Secondary | ICD-10-CM | POA: Diagnosis not present

## 2021-06-18 DIAGNOSIS — E7800 Pure hypercholesterolemia, unspecified: Secondary | ICD-10-CM | POA: Diagnosis not present

## 2021-06-18 DIAGNOSIS — R338 Other retention of urine: Secondary | ICD-10-CM | POA: Diagnosis not present

## 2021-06-18 DIAGNOSIS — Z9181 History of falling: Secondary | ICD-10-CM | POA: Diagnosis not present

## 2021-06-18 DIAGNOSIS — Z87442 Personal history of urinary calculi: Secondary | ICD-10-CM | POA: Diagnosis not present

## 2021-06-18 DIAGNOSIS — Q6102 Congenital multiple renal cysts: Secondary | ICD-10-CM | POA: Diagnosis not present

## 2021-06-18 DIAGNOSIS — N182 Chronic kidney disease, stage 2 (mild): Secondary | ICD-10-CM | POA: Diagnosis not present

## 2021-06-18 DIAGNOSIS — G4730 Sleep apnea, unspecified: Secondary | ICD-10-CM | POA: Diagnosis not present

## 2021-06-18 DIAGNOSIS — Z466 Encounter for fitting and adjustment of urinary device: Secondary | ICD-10-CM | POA: Diagnosis not present

## 2021-06-18 DIAGNOSIS — M1990 Unspecified osteoarthritis, unspecified site: Secondary | ICD-10-CM | POA: Diagnosis not present

## 2021-07-04 DIAGNOSIS — G4730 Sleep apnea, unspecified: Secondary | ICD-10-CM | POA: Diagnosis not present

## 2021-07-04 DIAGNOSIS — M5136 Other intervertebral disc degeneration, lumbar region: Secondary | ICD-10-CM | POA: Diagnosis not present

## 2021-07-04 DIAGNOSIS — R338 Other retention of urine: Secondary | ICD-10-CM | POA: Diagnosis not present

## 2021-07-04 DIAGNOSIS — Z87891 Personal history of nicotine dependence: Secondary | ICD-10-CM | POA: Diagnosis not present

## 2021-07-04 DIAGNOSIS — E7800 Pure hypercholesterolemia, unspecified: Secondary | ICD-10-CM | POA: Diagnosis not present

## 2021-07-04 DIAGNOSIS — N182 Chronic kidney disease, stage 2 (mild): Secondary | ICD-10-CM | POA: Diagnosis not present

## 2021-07-04 DIAGNOSIS — I1310 Hypertensive heart and chronic kidney disease without heart failure, with stage 1 through stage 4 chronic kidney disease, or unspecified chronic kidney disease: Secondary | ICD-10-CM | POA: Diagnosis not present

## 2021-07-04 DIAGNOSIS — M1990 Unspecified osteoarthritis, unspecified site: Secondary | ICD-10-CM | POA: Diagnosis not present

## 2021-07-04 DIAGNOSIS — Z466 Encounter for fitting and adjustment of urinary device: Secondary | ICD-10-CM | POA: Diagnosis not present

## 2021-07-04 DIAGNOSIS — Q6102 Congenital multiple renal cysts: Secondary | ICD-10-CM | POA: Diagnosis not present

## 2021-07-04 DIAGNOSIS — G894 Chronic pain syndrome: Secondary | ICD-10-CM | POA: Diagnosis not present

## 2021-07-04 DIAGNOSIS — Z9181 History of falling: Secondary | ICD-10-CM | POA: Diagnosis not present

## 2021-07-04 DIAGNOSIS — M479 Spondylosis, unspecified: Secondary | ICD-10-CM | POA: Diagnosis not present

## 2021-07-04 DIAGNOSIS — Z87442 Personal history of urinary calculi: Secondary | ICD-10-CM | POA: Diagnosis not present

## 2021-07-04 DIAGNOSIS — R350 Frequency of micturition: Secondary | ICD-10-CM | POA: Diagnosis not present

## 2021-07-10 DIAGNOSIS — M5136 Other intervertebral disc degeneration, lumbar region: Secondary | ICD-10-CM | POA: Diagnosis not present

## 2021-07-10 DIAGNOSIS — G894 Chronic pain syndrome: Secondary | ICD-10-CM | POA: Diagnosis not present

## 2021-07-10 DIAGNOSIS — M479 Spondylosis, unspecified: Secondary | ICD-10-CM | POA: Diagnosis not present

## 2021-07-10 DIAGNOSIS — G629 Polyneuropathy, unspecified: Secondary | ICD-10-CM | POA: Diagnosis not present

## 2021-07-18 DIAGNOSIS — E7800 Pure hypercholesterolemia, unspecified: Secondary | ICD-10-CM | POA: Diagnosis not present

## 2021-07-18 DIAGNOSIS — G4730 Sleep apnea, unspecified: Secondary | ICD-10-CM | POA: Diagnosis not present

## 2021-07-18 DIAGNOSIS — Z87442 Personal history of urinary calculi: Secondary | ICD-10-CM | POA: Diagnosis not present

## 2021-07-18 DIAGNOSIS — I1310 Hypertensive heart and chronic kidney disease without heart failure, with stage 1 through stage 4 chronic kidney disease, or unspecified chronic kidney disease: Secondary | ICD-10-CM | POA: Diagnosis not present

## 2021-07-18 DIAGNOSIS — R338 Other retention of urine: Secondary | ICD-10-CM | POA: Diagnosis not present

## 2021-07-18 DIAGNOSIS — Z466 Encounter for fitting and adjustment of urinary device: Secondary | ICD-10-CM | POA: Diagnosis not present

## 2021-07-18 DIAGNOSIS — N182 Chronic kidney disease, stage 2 (mild): Secondary | ICD-10-CM | POA: Diagnosis not present

## 2021-07-18 DIAGNOSIS — R350 Frequency of micturition: Secondary | ICD-10-CM | POA: Diagnosis not present

## 2021-07-18 DIAGNOSIS — M1990 Unspecified osteoarthritis, unspecified site: Secondary | ICD-10-CM | POA: Diagnosis not present

## 2021-07-18 DIAGNOSIS — M479 Spondylosis, unspecified: Secondary | ICD-10-CM | POA: Diagnosis not present

## 2021-07-18 DIAGNOSIS — Q6102 Congenital multiple renal cysts: Secondary | ICD-10-CM | POA: Diagnosis not present

## 2021-07-18 DIAGNOSIS — M5136 Other intervertebral disc degeneration, lumbar region: Secondary | ICD-10-CM | POA: Diagnosis not present

## 2021-07-18 DIAGNOSIS — Z87891 Personal history of nicotine dependence: Secondary | ICD-10-CM | POA: Diagnosis not present

## 2021-07-18 DIAGNOSIS — Z9181 History of falling: Secondary | ICD-10-CM | POA: Diagnosis not present

## 2021-07-18 DIAGNOSIS — G894 Chronic pain syndrome: Secondary | ICD-10-CM | POA: Diagnosis not present

## 2021-07-24 ENCOUNTER — Other Ambulatory Visit: Payer: Self-pay | Admitting: Adult Health

## 2021-07-24 DIAGNOSIS — F329 Major depressive disorder, single episode, unspecified: Secondary | ICD-10-CM

## 2021-07-24 DIAGNOSIS — F331 Major depressive disorder, recurrent, moderate: Secondary | ICD-10-CM

## 2021-07-24 DIAGNOSIS — G4700 Insomnia, unspecified: Secondary | ICD-10-CM

## 2021-07-29 DIAGNOSIS — I1310 Hypertensive heart and chronic kidney disease without heart failure, with stage 1 through stage 4 chronic kidney disease, or unspecified chronic kidney disease: Secondary | ICD-10-CM | POA: Diagnosis not present

## 2021-07-29 DIAGNOSIS — E7800 Pure hypercholesterolemia, unspecified: Secondary | ICD-10-CM | POA: Diagnosis not present

## 2021-07-29 DIAGNOSIS — N182 Chronic kidney disease, stage 2 (mild): Secondary | ICD-10-CM | POA: Diagnosis not present

## 2021-07-29 DIAGNOSIS — R338 Other retention of urine: Secondary | ICD-10-CM | POA: Diagnosis not present

## 2021-07-29 DIAGNOSIS — Z87442 Personal history of urinary calculi: Secondary | ICD-10-CM | POA: Diagnosis not present

## 2021-07-29 DIAGNOSIS — R350 Frequency of micturition: Secondary | ICD-10-CM | POA: Diagnosis not present

## 2021-07-29 DIAGNOSIS — M5136 Other intervertebral disc degeneration, lumbar region: Secondary | ICD-10-CM | POA: Diagnosis not present

## 2021-07-29 DIAGNOSIS — Z87891 Personal history of nicotine dependence: Secondary | ICD-10-CM | POA: Diagnosis not present

## 2021-07-29 DIAGNOSIS — M479 Spondylosis, unspecified: Secondary | ICD-10-CM | POA: Diagnosis not present

## 2021-07-29 DIAGNOSIS — M1990 Unspecified osteoarthritis, unspecified site: Secondary | ICD-10-CM | POA: Diagnosis not present

## 2021-07-29 DIAGNOSIS — G4730 Sleep apnea, unspecified: Secondary | ICD-10-CM | POA: Diagnosis not present

## 2021-07-29 DIAGNOSIS — Z9181 History of falling: Secondary | ICD-10-CM | POA: Diagnosis not present

## 2021-07-29 DIAGNOSIS — Q6102 Congenital multiple renal cysts: Secondary | ICD-10-CM | POA: Diagnosis not present

## 2021-07-29 DIAGNOSIS — G894 Chronic pain syndrome: Secondary | ICD-10-CM | POA: Diagnosis not present

## 2021-07-29 DIAGNOSIS — Z466 Encounter for fitting and adjustment of urinary device: Secondary | ICD-10-CM | POA: Diagnosis not present

## 2021-08-07 DIAGNOSIS — M1990 Unspecified osteoarthritis, unspecified site: Secondary | ICD-10-CM | POA: Diagnosis not present

## 2021-08-07 DIAGNOSIS — G894 Chronic pain syndrome: Secondary | ICD-10-CM | POA: Diagnosis not present

## 2021-08-07 DIAGNOSIS — G629 Polyneuropathy, unspecified: Secondary | ICD-10-CM | POA: Diagnosis not present

## 2021-08-07 DIAGNOSIS — M5136 Other intervertebral disc degeneration, lumbar region: Secondary | ICD-10-CM | POA: Diagnosis not present

## 2021-08-10 DIAGNOSIS — Z466 Encounter for fitting and adjustment of urinary device: Secondary | ICD-10-CM | POA: Diagnosis not present

## 2021-08-10 DIAGNOSIS — R338 Other retention of urine: Secondary | ICD-10-CM | POA: Diagnosis not present

## 2021-08-10 DIAGNOSIS — R350 Frequency of micturition: Secondary | ICD-10-CM | POA: Diagnosis not present

## 2021-08-15 DIAGNOSIS — R338 Other retention of urine: Secondary | ICD-10-CM | POA: Diagnosis not present

## 2021-08-15 DIAGNOSIS — R350 Frequency of micturition: Secondary | ICD-10-CM | POA: Diagnosis not present

## 2021-08-15 DIAGNOSIS — Z466 Encounter for fitting and adjustment of urinary device: Secondary | ICD-10-CM | POA: Diagnosis not present

## 2021-08-30 DIAGNOSIS — R338 Other retention of urine: Secondary | ICD-10-CM | POA: Diagnosis not present

## 2021-08-30 DIAGNOSIS — R350 Frequency of micturition: Secondary | ICD-10-CM | POA: Diagnosis not present

## 2021-08-30 DIAGNOSIS — Z466 Encounter for fitting and adjustment of urinary device: Secondary | ICD-10-CM | POA: Diagnosis not present

## 2021-09-01 DIAGNOSIS — Z466 Encounter for fitting and adjustment of urinary device: Secondary | ICD-10-CM | POA: Diagnosis not present

## 2021-09-01 DIAGNOSIS — R350 Frequency of micturition: Secondary | ICD-10-CM | POA: Diagnosis not present

## 2021-09-01 DIAGNOSIS — R338 Other retention of urine: Secondary | ICD-10-CM | POA: Diagnosis not present

## 2021-09-03 DIAGNOSIS — Z466 Encounter for fitting and adjustment of urinary device: Secondary | ICD-10-CM | POA: Diagnosis not present

## 2021-09-03 DIAGNOSIS — R350 Frequency of micturition: Secondary | ICD-10-CM | POA: Diagnosis not present

## 2021-09-03 DIAGNOSIS — R338 Other retention of urine: Secondary | ICD-10-CM | POA: Diagnosis not present

## 2021-09-15 DIAGNOSIS — R338 Other retention of urine: Secondary | ICD-10-CM | POA: Diagnosis not present

## 2021-09-22 DIAGNOSIS — R338 Other retention of urine: Secondary | ICD-10-CM | POA: Diagnosis not present

## 2021-09-22 DIAGNOSIS — Z466 Encounter for fitting and adjustment of urinary device: Secondary | ICD-10-CM | POA: Diagnosis not present

## 2021-09-22 DIAGNOSIS — R350 Frequency of micturition: Secondary | ICD-10-CM | POA: Diagnosis not present

## 2021-09-25 ENCOUNTER — Other Ambulatory Visit: Payer: Self-pay | Admitting: Urology

## 2021-09-26 NOTE — Progress Notes (Addendum)
COVID Vaccine Completed: yes x2  Date of COVID positive in last 90 days:  PCP - [name], MD Cardiologist -   Chest x-ray -  EKG -  Stress Test -  ECHO - 04/10/2019 [facility] Cardiac Cath -  Pacemaker/ICD device last checked: Spinal Cord Stimulator:  Bowel Prep -   Sleep Study -  CPAP -   Fasting Blood Sugar -  Checks Blood Sugar _____ times a day  Blood Thinner Instructions: Aspirin Instructions: Last Dose:  Activity level:  Can go up a flight of stairs and perform activities of daily living without stopping and without symptoms of chest pain or shortness of breath.  Able to exercise without symptoms  Unable to go up a flight of stairs without symptoms of     Anesthesia review:   Patient denies shortness of breath, fever, cough and chest pain at PAT appointment  Patient verbalized understanding of instructions that were given to them at the PAT appointment. Patient was also instructed that they will need to review over the PAT instructions again at home before surgery.

## 2021-09-26 NOTE — Patient Instructions (Signed)
SURGICAL WAITING ROOM VISITATION Patients having surgery or a procedure may have no more than 2 support people in the waiting area - these visitors may rotate.   Children under the age of 8 must have an adult with them who is not the patient. If the patient needs to stay at the hospital during part of their recovery, the visitor guidelines for inpatient rooms apply. Pre-op nurse will coordinate an appropriate time for 1 support person to accompany patient in pre-op.  This support person may not rotate.    Please refer to the [facility] website for the visitor guidelines for Inpatients (after your surgery is over and you are in a regular room).    Your procedure is scheduled on: 10/06/21   Report to [facility] Main Entrance    Report to admitting at 10:15 AM   Call this number if you have problems the morning of surgery [phone]   Do not eat food :After Midnight.   After Midnight you may have the following liquids until 9:30 AM DAY OF SURGERY  Water Non-Citrus Juices (without pulp, NO RED) Carbonated Beverages Black Coffee (NO MILK/CREAM OR CREAMERS, sugar ok)  Clear Tea (NO MILK/CREAM OR CREAMERS, sugar ok) regular and decaf                             Plain Jell-O (NO RED)                                           Fruit ices (not with fruit pulp, NO RED)                                     Popsicles (NO RED)                                                               Sports drinks like Gatorade (NO RED)              FOLLOW BOWEL PREP AND ANY ADDITIONAL PRE OP INSTRUCTIONS YOU RECEIVED FROM YOUR SURGEON'S OFFICE!!!     Oral Hygiene is also important to reduce your risk of infection.                                    Remember - BRUSH YOUR TEETH THE MORNING OF SURGERY WITH YOUR REGULAR TOOTHPASTE    Take these medicines the morning of surgery with A SIP OF WATER: Tylenol, Oxycodone, Effexor  Bring CPAP mask and tubing day of surgery.                               You may not have any metal on your body including jewelry, and body piercing             Do not wear lotions, powders, cologne, or deodorant              Men may shave face  and neck.   Do not bring valuables to the hospital. [place].  DO NOT [place]. PHARMACY WILL DISPENSE MEDICATIONS LISTED ON YOUR MEDICATION LIST TO YOU DURING YOUR ADMISSION [place]!    Patients discharged on the day of surgery will not be allowed to drive home.  Someone NEEDS to stay with you for the first 24 hours after anesthesia.              Please read over the following fact sheets you were given: IF YOU HAVE QUESTIONS ABOUT YOUR PRE-OP INSTRUCTIONS PLEASE CALL [place] - Preparing for Surgery Before surgery, you can play an important role.  Because skin is not sterile, your skin needs to be as free of germs as possible.  You can reduce the number of germs on your skin by washing with CHG (chlorahexidine gluconate) soap before surgery.  CHG is an antiseptic cleaner which kills germs and bonds with the skin to continue killing germs even after washing. Please DO NOT use if you have an allergy to CHG or antibacterial soaps.  If your skin becomes reddened/irritated stop using the CHG and inform your nurse when you arrive at Short Stay. Do not shave (including legs and underarms) for at least 48 hours prior to the first CHG shower.  You may shave your face/neck.  Please follow these instructions carefully:  1.  Shower with CHG Soap the night before surgery and the  morning of surgery.  2.  If you choose to wash your hair, wash your hair first as usual with your normal  shampoo.  3.  After you shampoo, rinse your hair and body thoroughly to remove the shampoo.                             4.  Use CHG as you would any other liquid soap.  You can apply chg directly to the skin and wash.  Gently with a  scrungie or clean washcloth.  5.  Apply the CHG Soap to your body ONLY FROM THE NECK DOWN.   Do   not use on face/ open                           Wound or open sores. Avoid contact with eyes, ears mouth and   genitals (private parts).                       Wash face,  Genitals (private parts) with your normal soap.             6.  Wash thoroughly, paying special attention to the area where your    surgery  will be performed.  7.  Thoroughly rinse your body with warm water from the neck down.  8.  DO NOT shower/wash with your normal soap after using and rinsing off the CHG Soap.                9.  Pat yourself dry with a clean towel.            10.  Wear clean pajamas.            11.  Place clean sheets on your bed  the night of your first shower and do not  sleep with pets. Day of Surgery : Do not apply any lotions/deodorants the morning of surgery.  Please wear clean clothes to the hospital/surgery center.  FAILURE TO FOLLOW THESE INSTRUCTIONS MAY RESULT IN THE CANCELLATION OF YOUR SURGERY  PATIENT SIGNATURE_________________________________  NURSE SIGNATURE__________________________________  ________________________________________________________________________

## 2021-09-29 DIAGNOSIS — R338 Other retention of urine: Secondary | ICD-10-CM | POA: Diagnosis not present

## 2021-09-29 DIAGNOSIS — R350 Frequency of micturition: Secondary | ICD-10-CM | POA: Diagnosis not present

## 2021-09-29 DIAGNOSIS — Z466 Encounter for fitting and adjustment of urinary device: Secondary | ICD-10-CM | POA: Diagnosis not present

## 2021-09-30 ENCOUNTER — Encounter (HOSPITAL_COMMUNITY): Payer: Self-pay

## 2021-09-30 ENCOUNTER — Encounter (HOSPITAL_COMMUNITY)
Admission: RE | Admit: 2021-09-30 | Discharge: 2021-09-30 | Disposition: A | Discharge status: Home / Self Care | Payer: Medicare Other | Source: Ambulatory Visit | Attending: Urology | Admitting: Urology

## 2021-09-30 ENCOUNTER — Telehealth: Payer: Self-pay | Admitting: *Deleted

## 2021-09-30 VITALS — BP 124/74 | HR 85 | Temp 98.2°F | Resp 16 | Ht 70.0 in | Wt 205.0 lb

## 2021-09-30 DIAGNOSIS — Z01818 Encounter for other preprocedural examination: Secondary | ICD-10-CM | POA: Insufficient documentation

## 2021-09-30 DIAGNOSIS — I10 Essential (primary) hypertension: Secondary | ICD-10-CM | POA: Diagnosis not present

## 2021-09-30 LAB — CBC
HCT: 40.4 % (ref 39.0–52.0)
Hemoglobin: 12.8 g/dL — ABNORMAL LOW (ref 13.0–17.0)
MCH: 30.5 pg (ref 26.0–34.0)
MCHC: 31.7 g/dL (ref 30.0–36.0)
MCV: 96.4 fL (ref 80.0–100.0)
Platelets: 291 10*3/uL (ref 150–400)
RBC: 4.19 MIL/uL — ABNORMAL LOW (ref 4.22–5.81)
RDW: 12.7 % (ref 11.5–15.5)
WBC: 9.2 10*3/uL (ref 4.0–10.5)
nRBC: 0 % (ref 0.0–0.2)

## 2021-09-30 LAB — BASIC METABOLIC PANEL
Anion gap: 8 (ref 5–15)
BUN: 24 mg/dL — ABNORMAL HIGH (ref 8–23)
CO2: 25 mmol/L (ref 22–32)
Calcium: 10 mg/dL (ref 8.9–10.3)
Chloride: 106 mmol/L (ref 98–111)
Creatinine, Ser: 1.37 mg/dL — ABNORMAL HIGH (ref 0.61–1.24)
GFR, Estimated: 56 mL/min — ABNORMAL LOW (ref >60)
Glucose, Bld: 95 mg/dL (ref 70–99)
Potassium: 5.2 mmol/L — ABNORMAL HIGH (ref 3.5–5.1)
Sodium: 139 mmol/L (ref 135–145)

## 2021-09-30 NOTE — Patient Outreach (Signed)
  Care Coordination   09/30/2021 Name: Luke Foster MRN: [id number] DOB: May 13, 1950   Care Coordination Outreach Attempts:  An unsuccessful telephone outreach was attempted today to offer the patient information about available care coordination services as a benefit of their health plan.   Follow Up Plan:  Additional outreach attempts will be made to offer the patient care coordination information and services.   Encounter Outcome:  No Answer  Care Coordination Interventions Activated:  No   Care Coordination Interventions:  No, not indicated    Chong Sicilian, BSN, RN-BC RN Care Coordinator Direct Dial: [phone]

## 2021-10-01 DIAGNOSIS — G894 Chronic pain syndrome: Secondary | ICD-10-CM | POA: Diagnosis not present

## 2021-10-01 DIAGNOSIS — G629 Polyneuropathy, unspecified: Secondary | ICD-10-CM | POA: Diagnosis not present

## 2021-10-01 DIAGNOSIS — M1990 Unspecified osteoarthritis, unspecified site: Secondary | ICD-10-CM | POA: Diagnosis not present

## 2021-10-01 DIAGNOSIS — M5136 Other intervertebral disc degeneration, lumbar region: Secondary | ICD-10-CM | POA: Diagnosis not present

## 2021-10-03 IMAGING — CT CT RENAL STONE PROTOCOL
2 of 4 series · 16 of 46 positions shown, 18 images · non-contrast
Comparison: CT abdomen pelvis dated 03/07/2019.

CLINICAL DATA: 70-year-old male with flank pain. Concern for kidney
stone.

EXAM:
CT ABDOMEN AND PELVIS WITHOUT CONTRAST
TECHNIQUE: Multidetector CT imaging of the abdomen and pelvis was performed
following the standard protocol without IV contrast.

[Series 2: axial st · axial · 0.86mm/px · z∈[-304,+116]mm · 13 of 94 slices shown, 15 images]
[im 5/94  soft-tissue]
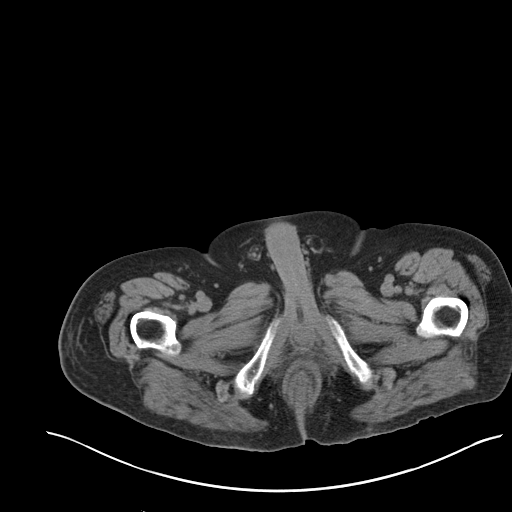
[im 5/94  bone]
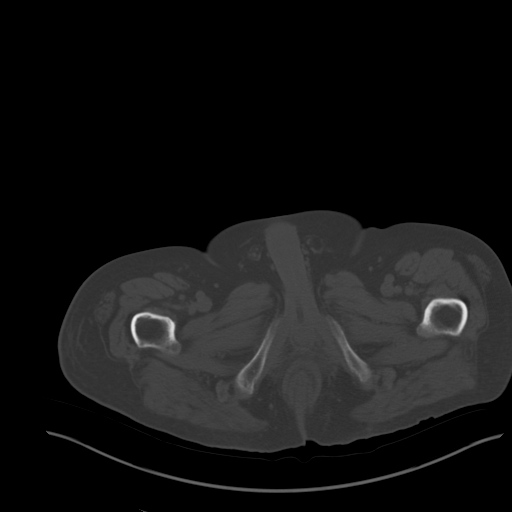
[im 14/94  soft-tissue]
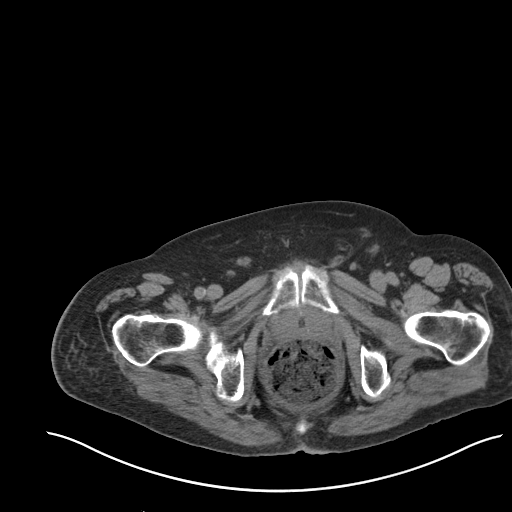
[im 19/94  soft-tissue]
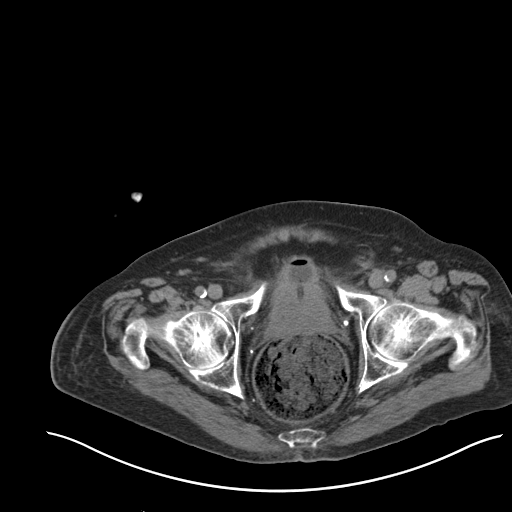
[im 28/94  soft-tissue]
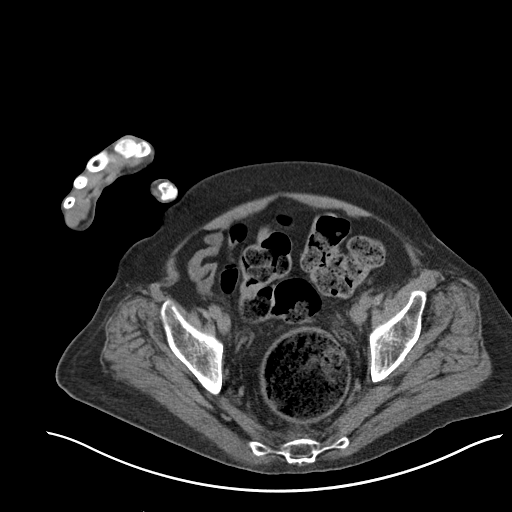
[im 33/94  soft-tissue]
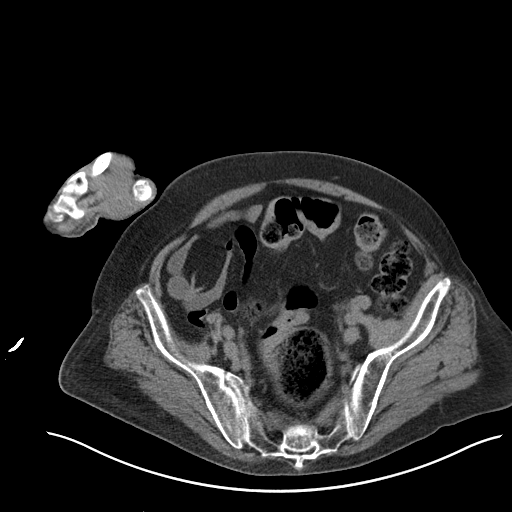
[im 42/94  soft-tissue]
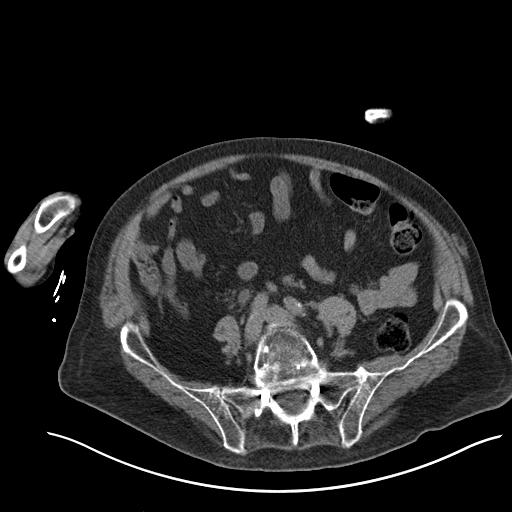
[im 47/94  soft-tissue]
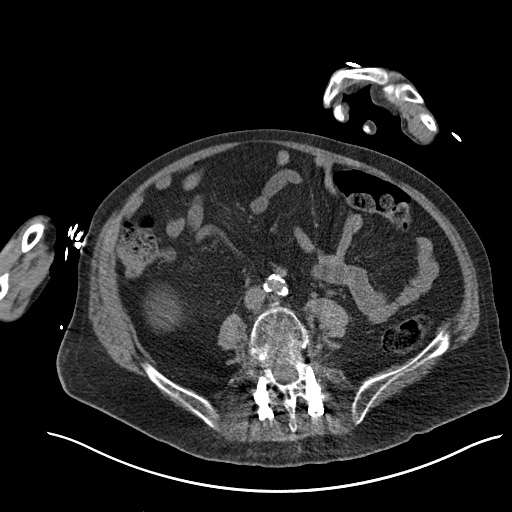
[im 52/94  soft-tissue]
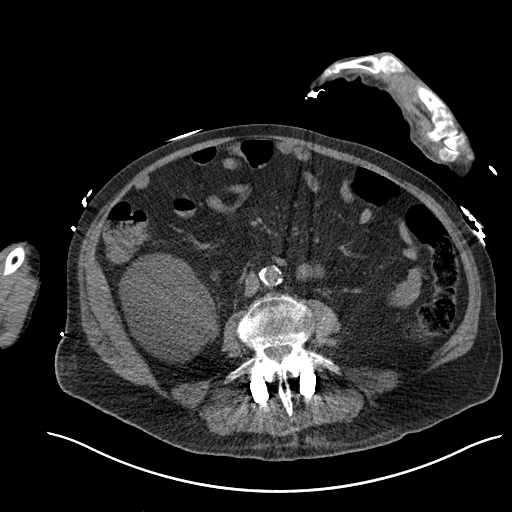
[im 61/94  soft-tissue]
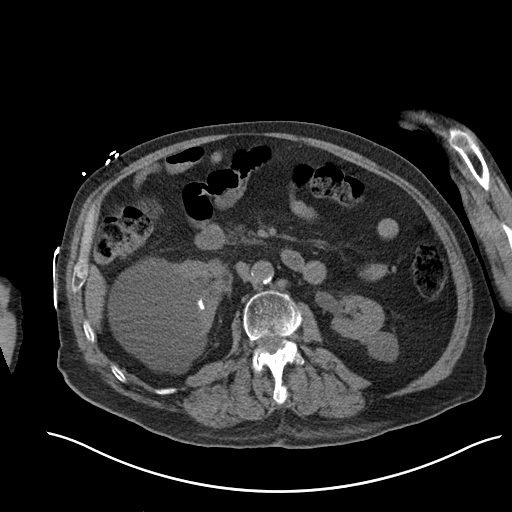
[im 61/94  bone]
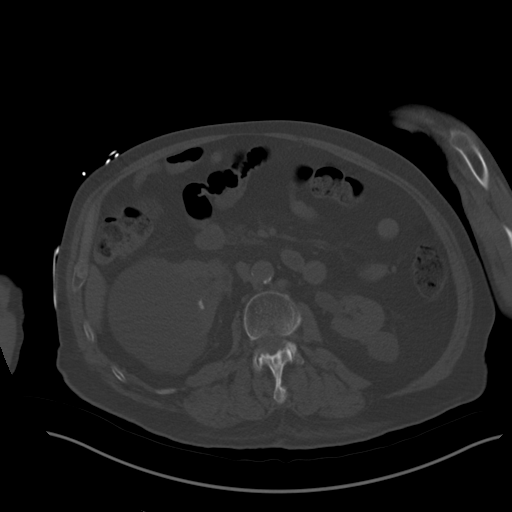
[im 66/94  soft-tissue]
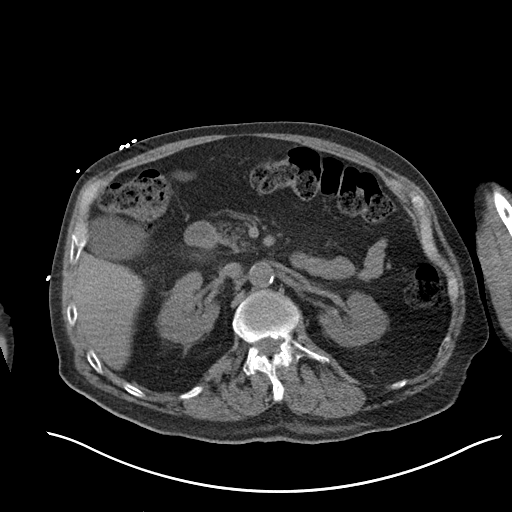
[im 75/94  soft-tissue]
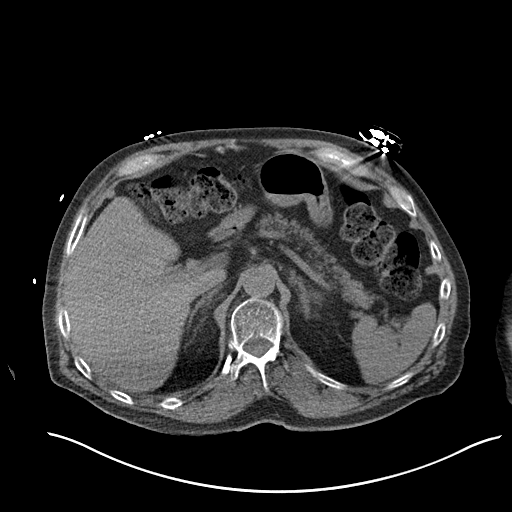
[im 80/94  soft-tissue]
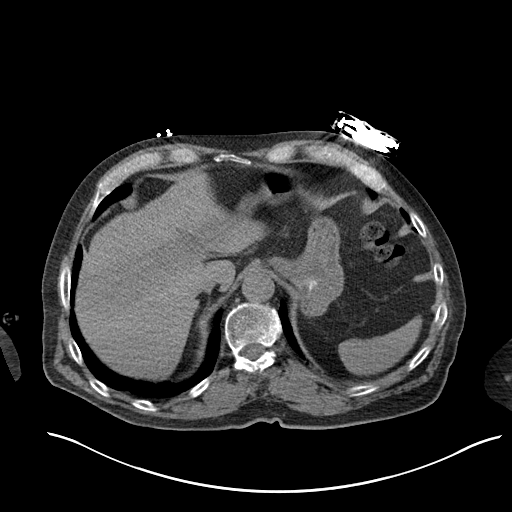
[im 89/94  soft-tissue]
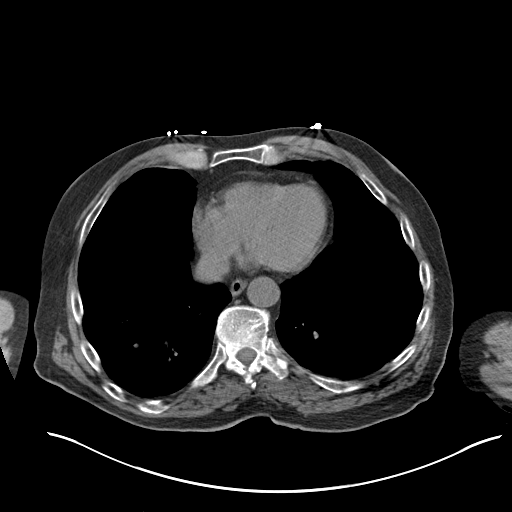

[Series 5: coronal · coronal · 0.78mm/px · 3 of 151 slices shown]
[im 51/151  soft-tissue]
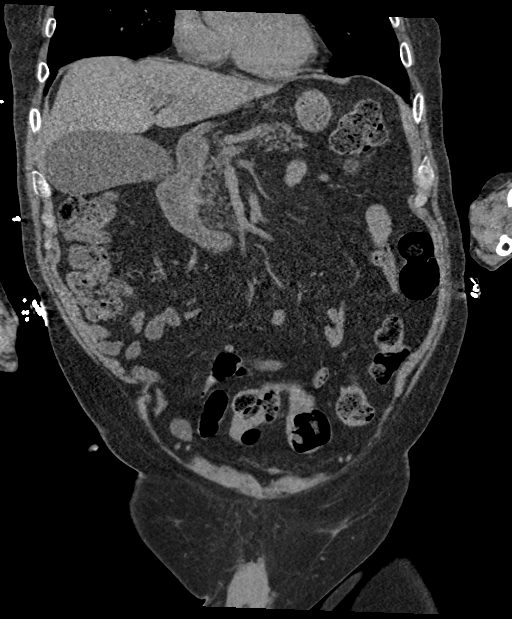
[im 67/151  soft-tissue]
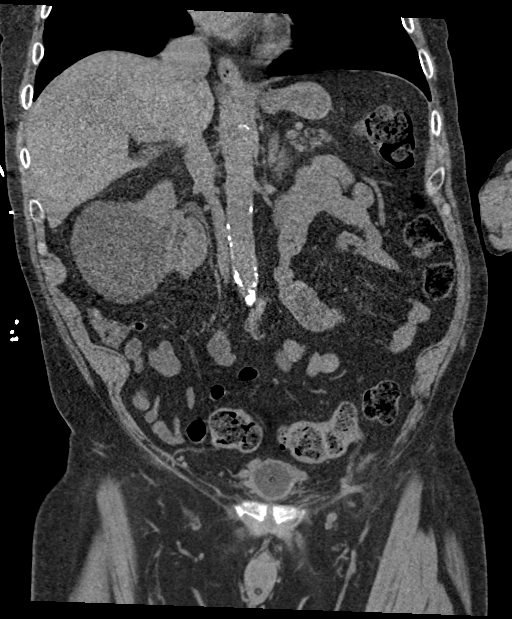
[im 84/151  soft-tissue]
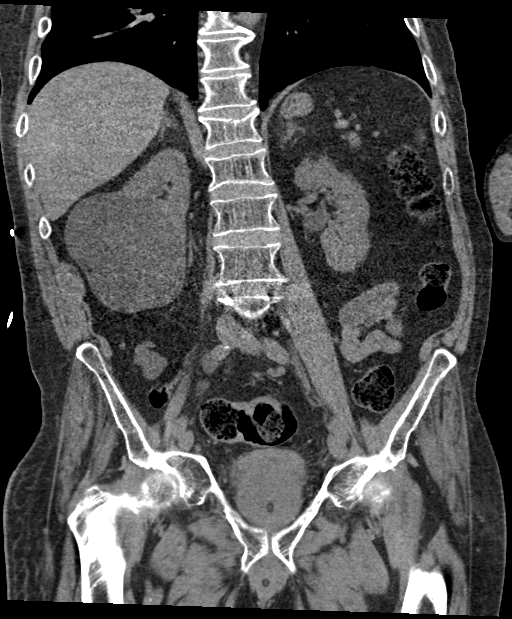

[16 of 46 positions shown; findings below may reference images not displayed]

FINDINGS: Evaluation of this exam is limited in the absence of intravenous
contrast.

Lower chest: The visualized lung bases are clear. Coronary vascular
calcifications noted.

No intra-abdominal free air or free fluid.

Hepatobiliary: The liver is unremarkable. The gallbladder is
distended. No calcified gallstone or pericholecystic fluid.

Pancreas: Unremarkable. No pancreatic ductal dilatation or
surrounding inflammatory changes.

Spleen: Normal in size without focal abnormality.

Adrenals/Urinary Tract: The adrenal glands are unremarkable. There
is a 10 cm right renal cyst. Several nonobstructing stones noted in
the mid to lower pole of the right kidney. A 15 mm curvilinear
calcification in the interpolar right kidney along the wall of the
cyst, new since the prior CT, and likely a nonobstructing stone and
less likely focal calcification of the cyst wall. No hydronephrosis.
There is a 3 cm left renal inferior pole exophytic cyst. There is no
hydronephrosis or nephrolithiasis on the left. The visualized
ureters appear unremarkable. The urinary bladder is decompressed
around a Foley catheter.

Stomach/Bowel: There is moderate amount of stool throughout the
colon with a large stool in the rectal vault. There is no bowel
obstruction or active inflammation. The appendix is normal.

Vascular/Lymphatic: Moderate aortoiliac atherosclerotic disease. The
IVC is unremarkable. No portal venous gas. There is no adenopathy.

Reproductive: The prostate gland is enlarged measuring 6 cm in
transverse axial diameter. The seminal vesicles are symmetric.

Other: None

Musculoskeletal: Osteopenia with degenerative changes of the spine.
L4-S1 posterior fusion. No acute osseous pathology. Old T12 and L1
compression fractures.
IMPRESSION: 1. Nonobstructing right renal stones. No hydronephrosis or
obstructing stone.
2. Moderate colonic stool burden. No bowel obstruction. Normal
appendix.
3. Aortic Atherosclerosis (AW177-TZ7.7).

## 2021-10-03 IMAGING — CT CT HEAD W/O CM
3 of 4 series · 15 of 47 positions shown, 18 images · non-contrast
Comparison: 03/07/2019

CLINICAL DATA: Fall, hit left side of head

EXAM:
CT HEAD WITHOUT CONTRAST
TECHNIQUE: Contiguous axial images were obtained from the base of the skull
through the vertex without intravenous contrast.

[Series 2: head wo · axial · 0.47mm/px · z∈[+1285,+1405]mm · 9 of 32 slices shown, 12 images]
[im 4/32  brain]
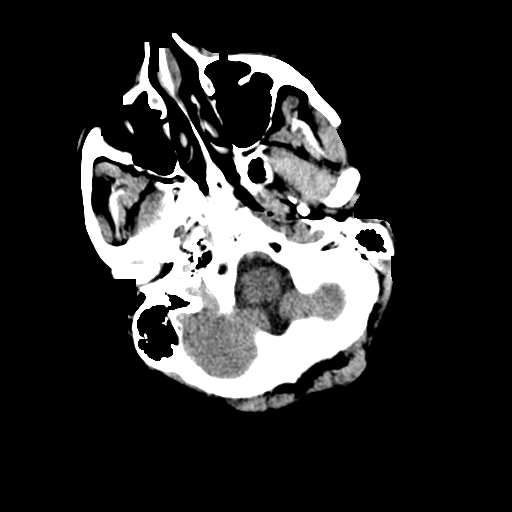
[im 4/32  bone]
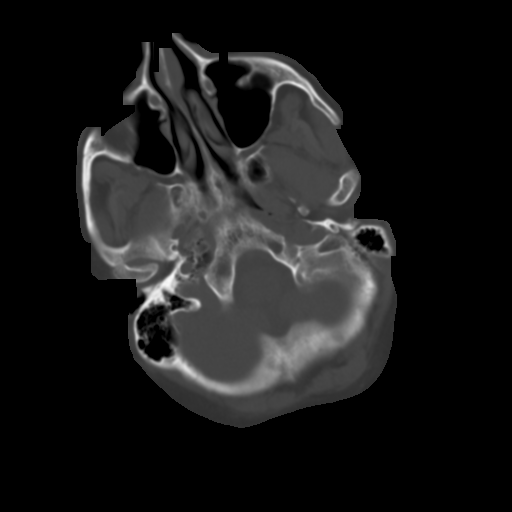
[im 7/32  brain]
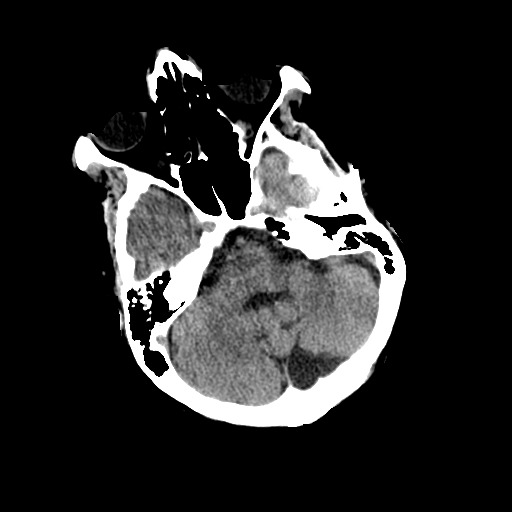
[im 10/32  brain]
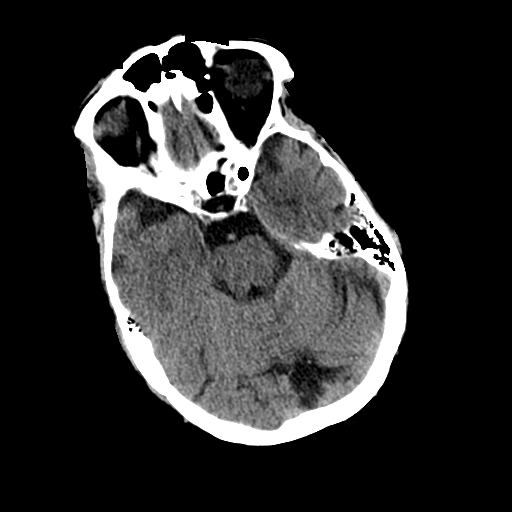
[im 13/32  brain]
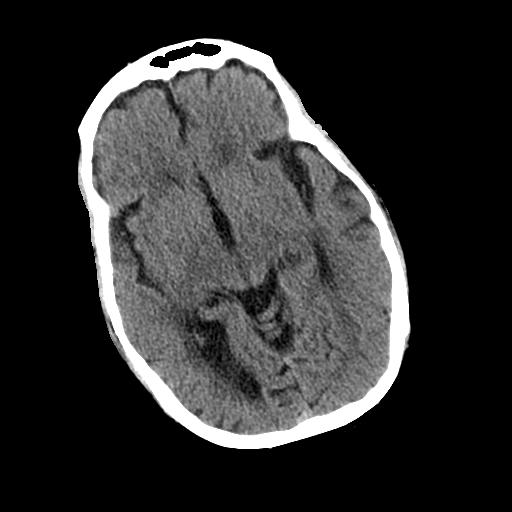
[im 16/32  brain]
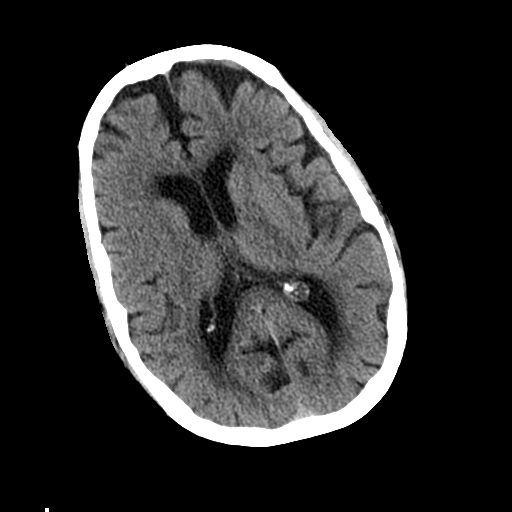
[im 16/32  bone]
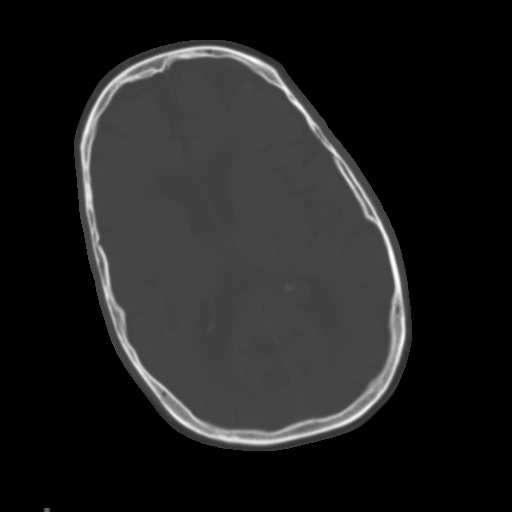
[im 19/32  brain]
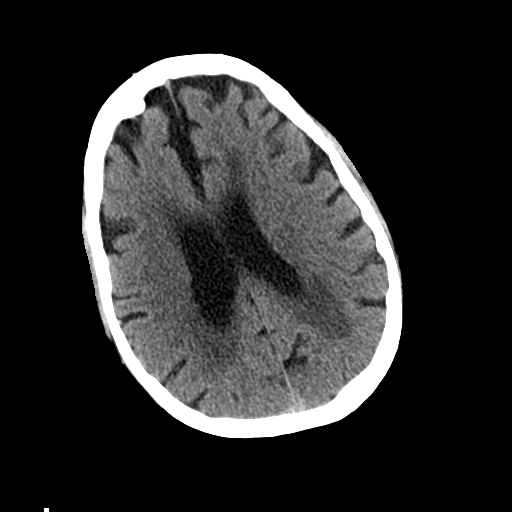
[im 22/32  brain]
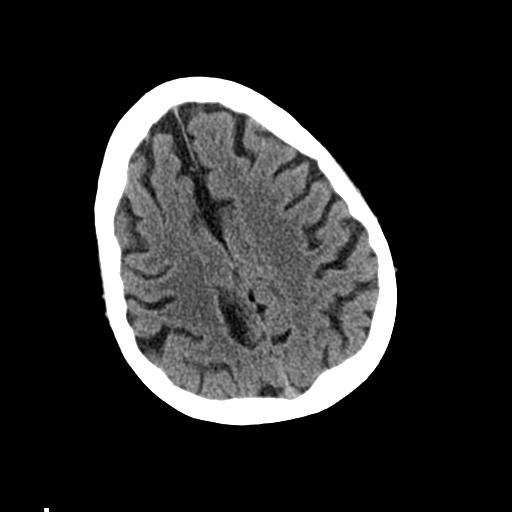
[im 25/32  brain]
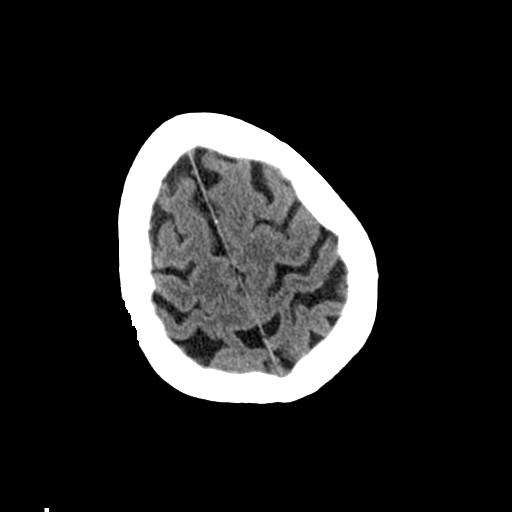
[im 28/32  brain]
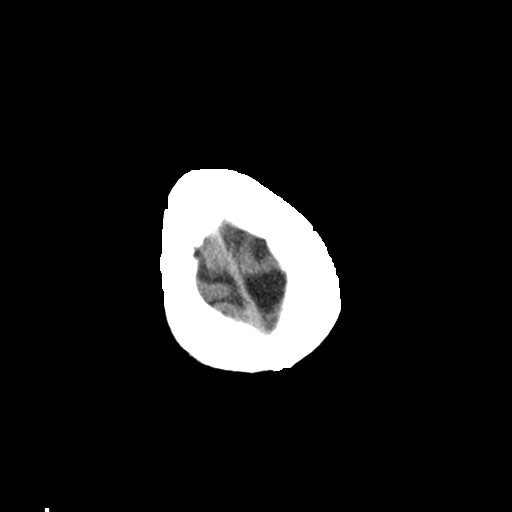
[im 28/32  bone]
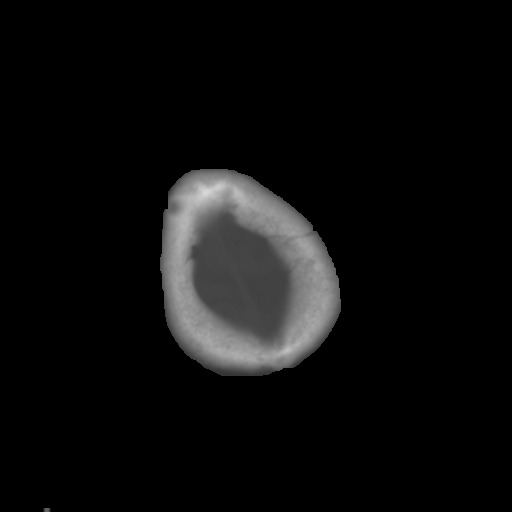

[Series 5: coronal soft tissue · coronal · 0.30mm/px · 3 of 72 slices shown]
[im 26/72  brain]
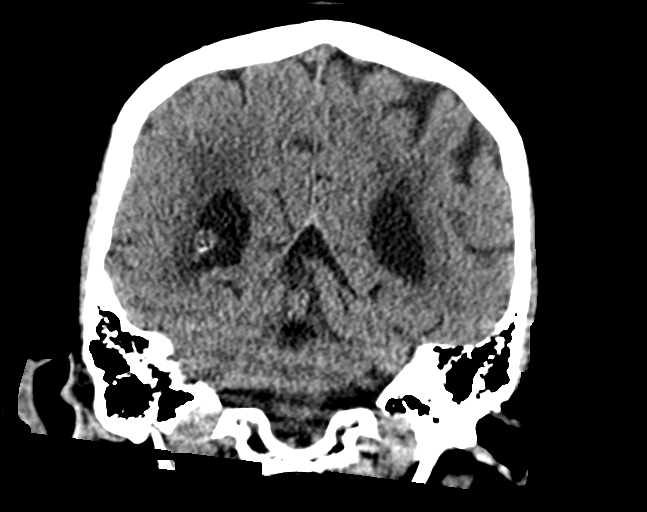
[im 33/72  brain]
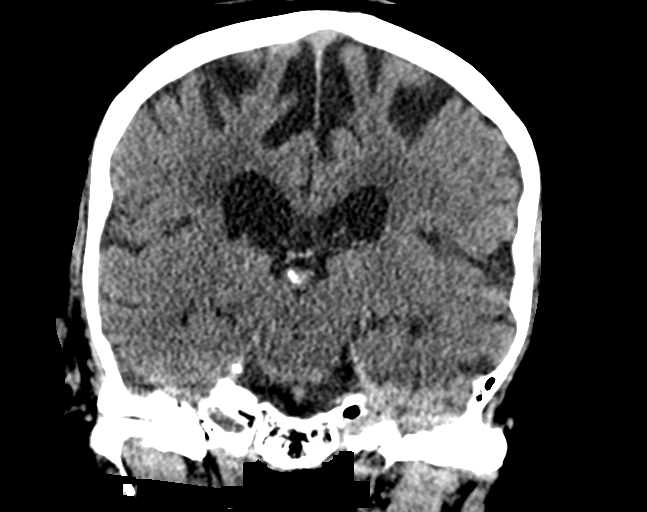
[im 39/72  brain]
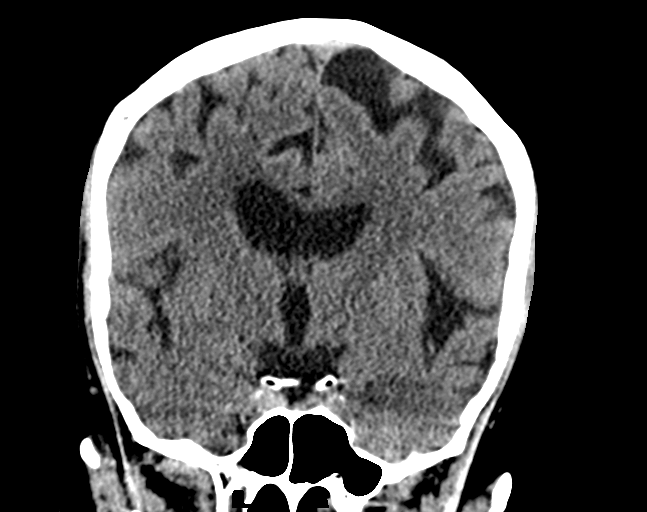

[Series 6: sagittal soft tissue · sagittal · 0.30mm/px · 3 of 66 slices shown]
[im 25/66  brain]
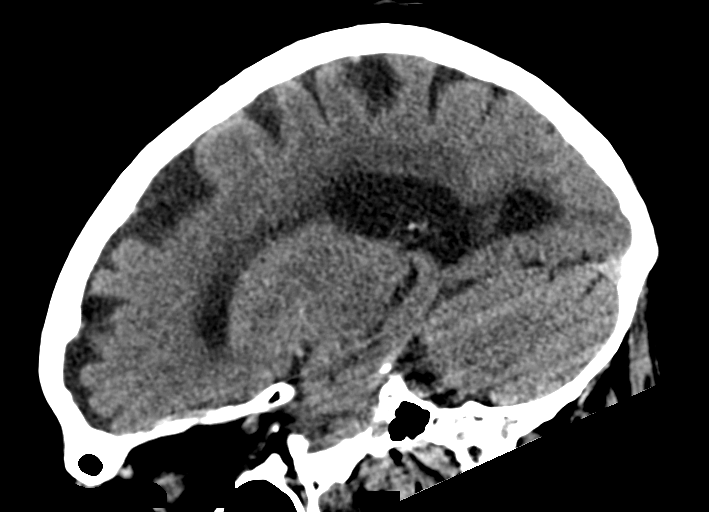
[im 33/66  brain]
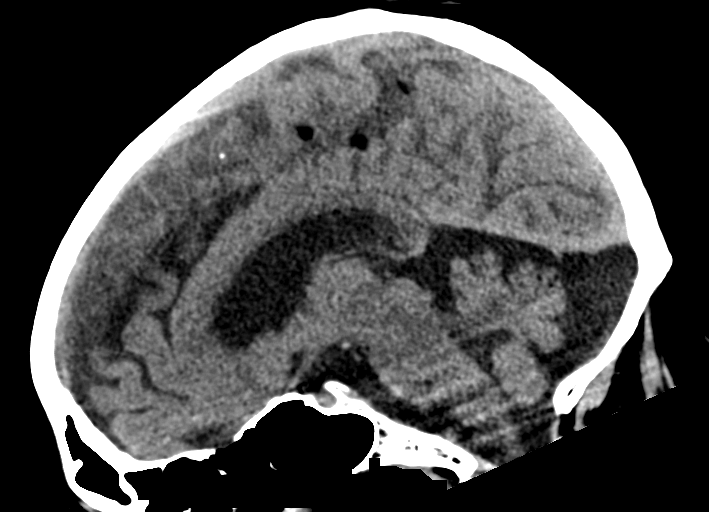
[im 41/66  brain]
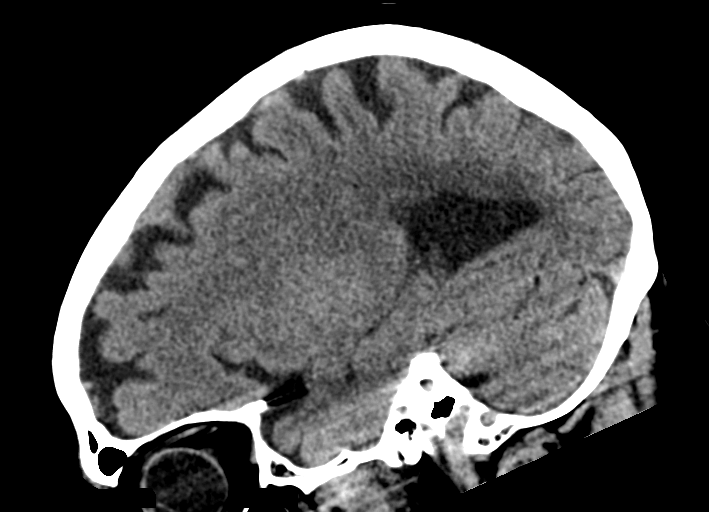

[15 of 47 positions shown; findings below may reference images not displayed]

FINDINGS: Brain: There is atrophy and chronic small vessel disease changes. No
acute intracranial abnormality. Specifically, no hemorrhage,
hydrocephalus, mass lesion, acute infarction, or significant
intracranial injury.

Vascular: No hyperdense vessel or unexpected calcification.

Skull: No acute calvarial abnormality.

Sinuses/Orbits: No acute findings

Other: None
IMPRESSION: Atrophy, chronic microvascular disease.

No acute intracranial abnormality.

## 2021-10-06 ENCOUNTER — Ambulatory Visit (HOSPITAL_COMMUNITY)
Admission: RE | Admit: 2021-10-06 | Discharge: 2021-10-06 | Disposition: A | Discharge status: Home / Self Care | Payer: Medicare Other | Attending: Urology | Admitting: Urology

## 2021-10-06 ENCOUNTER — Encounter (HOSPITAL_COMMUNITY)
Admission: RE | Disposition: A | Discharge status: Home / Self Care | Payer: Self-pay | Source: Home / Self Care | Attending: Urology

## 2021-10-06 ENCOUNTER — Other Ambulatory Visit: Payer: Self-pay

## 2021-10-06 ENCOUNTER — Ambulatory Visit (HOSPITAL_BASED_OUTPATIENT_CLINIC_OR_DEPARTMENT_OTHER): Payer: Medicare Other | Admitting: Anesthesiology

## 2021-10-06 ENCOUNTER — Encounter (HOSPITAL_COMMUNITY): Payer: Self-pay | Admitting: Urology

## 2021-10-06 ENCOUNTER — Ambulatory Visit (HOSPITAL_COMMUNITY): Payer: Medicare Other | Admitting: Anesthesiology

## 2021-10-06 DIAGNOSIS — N319 Neuromuscular dysfunction of bladder, unspecified: Secondary | ICD-10-CM | POA: Insufficient documentation

## 2021-10-06 DIAGNOSIS — R339 Retention of urine, unspecified: Secondary | ICD-10-CM | POA: Insufficient documentation

## 2021-10-06 DIAGNOSIS — N210 Calculus in bladder: Secondary | ICD-10-CM | POA: Diagnosis not present

## 2021-10-06 DIAGNOSIS — Z87891 Personal history of nicotine dependence: Secondary | ICD-10-CM | POA: Diagnosis not present

## 2021-10-06 DIAGNOSIS — I10 Essential (primary) hypertension: Secondary | ICD-10-CM

## 2021-10-06 DIAGNOSIS — N401 Enlarged prostate with lower urinary tract symptoms: Secondary | ICD-10-CM | POA: Insufficient documentation

## 2021-10-06 DIAGNOSIS — Z79899 Other long term (current) drug therapy: Secondary | ICD-10-CM | POA: Insufficient documentation

## 2021-10-06 DIAGNOSIS — G4730 Sleep apnea, unspecified: Secondary | ICD-10-CM | POA: Diagnosis not present

## 2021-10-06 DIAGNOSIS — F17210 Nicotine dependence, cigarettes, uncomplicated: Secondary | ICD-10-CM | POA: Insufficient documentation

## 2021-10-06 DIAGNOSIS — N182 Chronic kidney disease, stage 2 (mild): Secondary | ICD-10-CM | POA: Insufficient documentation

## 2021-10-06 DIAGNOSIS — R338 Other retention of urine: Secondary | ICD-10-CM | POA: Diagnosis not present

## 2021-10-06 DIAGNOSIS — I129 Hypertensive chronic kidney disease with stage 1 through stage 4 chronic kidney disease, or unspecified chronic kidney disease: Secondary | ICD-10-CM | POA: Diagnosis not present

## 2021-10-06 DIAGNOSIS — G4733 Obstructive sleep apnea (adult) (pediatric): Secondary | ICD-10-CM

## 2021-10-06 DIAGNOSIS — Z9989 Dependence on other enabling machines and devices: Secondary | ICD-10-CM | POA: Diagnosis not present

## 2021-10-06 HISTORY — PX: BOTOX INJECTION: SHX5754

## 2021-10-06 SURGERY — BOTOX INJECTION
Anesthesia: General | Site: Penis

## 2021-10-06 MED ORDER — DEXAMETHASONE SODIUM PHOSPHATE 10 MG/ML IJ SOLN
INTRAMUSCULAR | Status: AC
  Filled 2021-10-06: qty 1

## 2021-10-06 MED ORDER — STERILE WATER FOR IRRIGATION IR SOLN
Status: DC | PRN
  Administered 2021-10-06: 250 mL

## 2021-10-06 MED ORDER — SODIUM CHLORIDE (PF) 0.9 % IJ SOLN
INTRAMUSCULAR | Status: AC
  Filled 2021-10-06: qty 10

## 2021-10-06 MED ORDER — CHLORHEXIDINE GLUCONATE 0.12 % MT SOLN
15.0000 mL | Freq: Once | OROMUCOSAL | Status: AC
  Administered 2021-10-06: 15 mL via OROMUCOSAL

## 2021-10-06 MED ORDER — SODIUM CHLORIDE (PF) 0.9 % IJ SOLN
INTRAMUSCULAR | Status: AC
Start: 2021-10-06 — End: ?
  Filled 2021-10-06: qty 20

## 2021-10-06 MED ORDER — AMISULPRIDE (ANTIEMETIC) 5 MG/2ML IV SOLN: 10.0000 mg | Freq: Once | INTRAVENOUS | Status: DC | PRN

## 2021-10-06 MED ORDER — FENTANYL CITRATE (PF) 100 MCG/2ML IJ SOLN
INTRAMUSCULAR | Status: AC
  Filled 2021-10-06: qty 2

## 2021-10-06 MED ORDER — ONDANSETRON HCL 4 MG/2ML IJ SOLN
INTRAMUSCULAR | Status: AC
Start: 2021-10-06 — End: ?
  Filled 2021-10-06: qty 2

## 2021-10-06 MED ORDER — LIDOCAINE HCL (PF) 2 % IJ SOLN
INTRAMUSCULAR | Status: AC
Start: 2021-10-06 — End: ?
  Filled 2021-10-06: qty 5

## 2021-10-06 MED ORDER — PROPOFOL 10 MG/ML IV BOLUS
INTRAVENOUS | Status: DC | PRN
  Administered 2021-10-06: 130 mg via INTRAVENOUS

## 2021-10-06 MED ORDER — LACTATED RINGERS IV SOLN: INTRAVENOUS | Status: DC

## 2021-10-06 MED ORDER — ONABOTULINUMTOXINA 100 UNITS IJ SOLR
INTRAMUSCULAR | Status: DC | PRN
  Administered 2021-10-06: 200 [IU] via INTRAMUSCULAR

## 2021-10-06 MED ORDER — FENTANYL CITRATE (PF) 100 MCG/2ML IJ SOLN
INTRAMUSCULAR | Status: DC | PRN
  Administered 2021-10-06: 100 ug via INTRAVENOUS
  Administered 2021-10-06 (×2): 50 ug via INTRAVENOUS

## 2021-10-06 MED ORDER — ONABOTULINUMTOXINA 100 UNITS IJ SOLR
INTRAMUSCULAR | Status: AC
  Filled 2021-10-06: qty 200

## 2021-10-06 MED ORDER — CEFAZOLIN SODIUM-DEXTROSE 2-4 GM/100ML-% IV SOLN
2.0000 g | INTRAVENOUS | Status: AC
  Administered 2021-10-06: 2 g via INTRAVENOUS
  Filled 2021-10-06: qty 100

## 2021-10-06 MED ORDER — PROMETHAZINE HCL 25 MG/ML IJ SOLN: 6.2500 mg | INTRAMUSCULAR | Status: DC | PRN

## 2021-10-06 MED ORDER — ORAL CARE MOUTH RINSE: 15.0000 mL | Freq: Once | OROMUCOSAL | Status: AC

## 2021-10-06 MED ORDER — ONDANSETRON HCL 4 MG/2ML IJ SOLN
INTRAMUSCULAR | Status: DC | PRN
  Administered 2021-10-06: 4 mg via INTRAVENOUS

## 2021-10-06 MED ORDER — PHENYLEPHRINE 80 MCG/ML (10ML) SYRINGE FOR IV PUSH (FOR BLOOD PRESSURE SUPPORT)
PREFILLED_SYRINGE | INTRAVENOUS | Status: AC
  Filled 2021-10-06: qty 10

## 2021-10-06 MED ORDER — MIDAZOLAM HCL 2 MG/2ML IJ SOLN
INTRAMUSCULAR | Status: DC | PRN
  Administered 2021-10-06: 2 mg via INTRAVENOUS

## 2021-10-06 MED ORDER — MIDAZOLAM HCL 2 MG/2ML IJ SOLN
INTRAMUSCULAR | Status: AC
  Filled 2021-10-06: qty 2

## 2021-10-06 MED ORDER — PHENYLEPHRINE 80 MCG/ML (10ML) SYRINGE FOR IV PUSH (FOR BLOOD PRESSURE SUPPORT)
PREFILLED_SYRINGE | INTRAVENOUS | Status: DC | PRN
  Administered 2021-10-06: 80 ug via INTRAVENOUS

## 2021-10-06 MED ORDER — LIDOCAINE 2% (20 MG/ML) 5 ML SYRINGE
INTRAMUSCULAR | Status: DC | PRN
  Administered 2021-10-06: 100 mg via INTRAVENOUS

## 2021-10-06 MED ORDER — FENTANYL CITRATE PF 50 MCG/ML IJ SOSY: 25.0000 ug | PREFILLED_SYRINGE | INTRAMUSCULAR | Status: DC | PRN

## 2021-10-06 MED ORDER — 0.9 % SODIUM CHLORIDE (POUR BTL) OPTIME
TOPICAL | Status: DC | PRN
  Administered 2021-10-06: 1000 mL

## 2021-10-06 MED ORDER — DEXAMETHASONE SODIUM PHOSPHATE 10 MG/ML IJ SOLN
INTRAMUSCULAR | Status: DC | PRN
  Administered 2021-10-06: 4 mg via INTRAVENOUS

## 2021-10-06 MED ORDER — STERILE WATER FOR IRRIGATION IR SOLN
Status: DC | PRN
  Administered 2021-10-06: 6000 mL

## 2021-10-06 MED ORDER — ACETAMINOPHEN 500 MG PO TABS
1000.0000 mg | ORAL_TABLET | Freq: Once | ORAL | Status: AC
  Administered 2021-10-06: 1000 mg via ORAL
  Filled 2021-10-06: qty 2

## 2021-10-06 SURGICAL SUPPLY — 19 items
BAG DRN RND TRDRP ANRFLXCHMBR (UROLOGICAL SUPPLIES) ×1
BAG URINE DRAIN 2000ML AR STRL (UROLOGICAL SUPPLIES) IMPLANT
BAG URO CATCHER STRL LF (MISCELLANEOUS) ×1 IMPLANT
CATH FOLEY 2WAY SLVR  5CC 20FR (CATHETERS) ×1
CATH FOLEY 2WAY SLVR 5CC 20FR (CATHETERS) IMPLANT
CLOTH BEACON ORANGE TIMEOUT ST (SAFETY) ×1 IMPLANT
FIBER LASER FLEXIVA 550 (UROLOGICAL SUPPLIES) IMPLANT
GLOVE BIO SURGEON STRL SZ7.5 (GLOVE) ×1 IMPLANT
GOWN STRL REUS W/ TWL XL LVL3 (GOWN DISPOSABLE) ×2 IMPLANT
GOWN STRL REUS W/TWL XL LVL3 (GOWN DISPOSABLE) ×2
MANIFOLD NEPTUNE II (INSTRUMENTS) ×1 IMPLANT
NDL ASPIRATION 22 (NEEDLE) ×1 IMPLANT
NDL SAFETY ECLIP 18X1.5 (MISCELLANEOUS) IMPLANT
NEEDLE ASPIRATION 22 (NEEDLE) ×1 IMPLANT
PACK CYSTO (CUSTOM PROCEDURE TRAY) ×1 IMPLANT
SET IRRIG Y TYPE TUR BLADDER L (SET/KITS/TRAYS/PACK) IMPLANT
SYR CONTROL 10ML LL (SYRINGE) IMPLANT
TUBING CONNECTING 10 (TUBING) IMPLANT
WATER STERILE IRR 3000ML UROMA (IV SOLUTION) ×1 IMPLANT

## 2021-10-06 NOTE — Transfer of Care (Signed)
Immediate Anesthesia Transfer of Care Note  Patient: Luke Foster  Procedure(s) Performed: CYSTOSCOPY LITHOLAPAXY WITH BOTOX INJECTION (Penis)  Patient Location: PACU  Anesthesia Type:General  Level of Consciousness: awake, alert  and oriented  Airway & Oxygen Therapy: Patient Spontanous Breathing and Patient connected to face mask oxygen  Post-op Assessment: Report given to RN and Post -op Vital signs reviewed and stable  Post vital signs: Reviewed and stable  Last Vitals:  Vitals Value Taken Time  BP 155/93 10/06/21 1335  Temp    Pulse 89 10/06/21 1337  Resp 13 10/06/21 1337  SpO2 100 % 10/06/21 1337  Vitals shown include unvalidated device data.  Last Pain:  Vitals:   10/06/21 1054  TempSrc:   PainSc: 8          Complications: No notable events documented.

## 2021-10-06 NOTE — H&P (Signed)
CC/HPI: CC/HPI: Cc: History of urinary retention, urge urinary incontinence  HPI:  71 year old male with history of urinary retention with gross hematuria. At that time, he underwent a CT without contrast on 08/15/2017. This revealed a 10 cm right renal cyst. There is no hydronephrosis or stones bilaterally. No obvious bladder tumor. He does have baseline CKD. He has not had a cystoscopy. He has been doing well on Flomax. He states that he has a good urinary stream and only nocturia 1. No further gross hematuria. Urinalysis is negative today. Patient also has significant right-sided flank pain that he believes to be secondary to a 10 cm right renal cyst.   03/24/19  Luke Foster has a hx of urinary retention, renal cysts, gross hematuria, lumbosacral radiculopathy, spinal cord stimulator implantation and removal, opioid dependence, metabolic encephalopathy, hypertension. He resides in [facility] post hospitalization from spinal stimulator removal on 7/42/59, complicated by acute post-op urinary retention, bilateral hydronephrosis and acute metabolic encephalopathy likely caused by malposition of foley in prostatic urethra and UTI. He presents today for a voiding trial. His brother brings him in today and is very involved in his care. His catheter has been in place 2 weeks. He is tolerating it well besides some slight catheter discomfort with position changes. He was having some leakage around the meatus, but that has subsided after beginning oxybutynin. He is taking oxybutynin three times a day for bladder spasms. He is also on Tamsulosin daily. He reports weakness of his lower extremities but states it is improving and he is feeling stronger daily. He denies confusion, SOB, worsening fatigue, fever, flank pain and nausea. Prior to this episode of acute retention, he was managed with tamsulosin for his LUTs and reports that he was urinating well , had a good flow to his stream and did not have any leakage,  urgency or dribbling.   03/29/19: Patient with above-noted history. He underwent successful voiding trial on 02/12; however, he did have significant bladder spasms noted at trial of void. He presents today with complaints of urinary frequency, urinary urgency, urge incontinence, and nocturia. He states that he was initially doing well following catheter removal, but irritative symptoms began approximately 3 days ago. He complains of urinary frequency of approximately every hour. He has nocturia x6-7. He notes urgency and episodes of urge incontinence, as well. He does note small volume voids, but generally feels he is emptying his bladder efficiently. He denies suprapubic pain or pressure currently. He denies gross hematuria, dysuria, fever, or chills. He does have some mild right flank pain today. He remains on tamsulosin. He is no longer using oxybutynin. He does have significant issues with constipation and is currently using MiraLax for management. He typically has a bowel movement every other day.   04/14/2019  Patient continues on tamsulosin. He continues to have significant urgency with associated urge urinary incontinence. He uses about 3 pull-ups per day. He has mild incomplete bladder emptying with a PVR of 118 cc. He was treated for urinary tract infection at the last visit. He denies any hematuria or dysuria.    11/15/2020  Patient continues to have intermittent issues with urinary retention. He has been having to have catheter changes every 2 weeks due to catheter obstruction if he waits any longer. As long as he changes it every 2 weeks he has not had much of a problem. Home health aide does irrigate his catheter every other day. This has helped tremendously. He underwent a urodynamics study which  revealed evidence of neurogenic detrusor overactivity/neurogenic bladder rather than bladder outlet obstruction. He had several uninhibited detrusor contractions and was unable to demonstrate a  voluntary contraction.   05/05/2021  Patient has tried anticholinergics and was prescribed Myrbetriq but could not afford this. Despite this, he continues to have a lot of leakage around the catheter likely secondary to bladder spasms. This is quite troublesome and bothersome to him. He wanted to discuss different options from here.   09/15/2021: Luke Foster is a 71 year old man who presents today for concerns of Foley catheter which continues to fall out of his urethra. He and his family are here to discuss this further. He is currently on Myrbetriq and Solifenacin. He is tolerating the catheter well.     ALLERGIES: Erythromycin - Itching    MEDICATIONS: Myrbetriq 50 mg tablet, extended release 24 hr 1 tablet PO Daily  Aripiprazole 5 mg tablet  Diltiazem 24Hr Er  Docusate Sodium 100 mg tablet  Enalapril Maleate  Flomax 0.4 mg capsule 1 capsule PO BID  Magnesium  Oxycodone-Acetaminophen 10 mg-325 mg tablet 1 tablet PO Q 4 H PRN  Polyethylene Glycol  Senexon-S 8.6 mg-50 mg tablet  Solifenacin Succinate 5 mg tablet 1 tablet PO Daily  Sulfamethoxazole  Trazodone Hcl 50 mg tablet  Venlafaxine Hcl  Vitamin B12  Vitamin D3     GU PSH: Complex cystometrogram, w/ void pressure and urethral pressure profile studies, any technique - 11/08/2020 Complex Uroflow - 11/08/2020 Cystoscopy - 05/10/2020, 2019 Emg surf Electrd - 11/08/2020 Inject For cystogram - 11/08/2020 Intrabd voidng Press - 11/08/2020       PSH Notes: Ureterolithotomy   NON-GU PSH: Back surgery Hernia Repair Mastectomy, Radical Neuroeltrd Stim Post Tibial - 10/05/2019, 09/29/2019, 09/21/2019, 2021, 2021, 2021, 2021, 2021, 2021, 2021, 2021, 2021         GU PMH: Detrusor overactivity - 05/05/2021, - 11/15/2020 Unihibited neuropathic bladder - 05/05/2021, - 11/15/2020 Urge incontinence - 05/05/2021, - 04/05/2020, - 2022, - 2021 Urinary Retention - 05/05/2021, - 11/15/2020, - 11/08/2020, - 10/21/2020, - 08/26/2020, - 06/27/2020 (Stable), -  05/10/2020 (Chronic), Instructed if unable to void in 4 hrs RTC for PVR and possible foley replacement. If he fails TOV will need UDS and cysto w/MD, - 2019 Incomplete bladder emptying - 12/24/2020, - 11/26/2020, - 06/12/2020, - 2021 Acute Cystitis/UTI - 06/12/2020 BPH w/LUTS - 06/12/2020, (Stable), - 05/10/2020, - 2019 Urinary Urgency (Stable) - 06/12/2020, - 2022, - 2021 Gross hematuria - 05/10/2020, - 04/05/2020 Urinary Frequency - 2022, - 2021 Balanitis - 2021 Chronic kidney disease stage 2 (GFR 60-90) - 2019 Nocturia - 2019 Renal cyst (Stable) - 2019, Bilateral, - 2019, Renal cyst, acquired, - 2014 History of urolithiasis, Nephrolithiasis - 2014 Low back pain    NON-GU PMH: Other mechanical complication of other urinary catheter, subsequent encounter - 08/26/2020 Anxiety Arthritis Depression Heart disease, unspecified Hypercholesterolemia Hypertension Sleep Apnea    FAMILY HISTORY: Breast Cancer - Mother Family Health Status - Father alive at age 38 - 24 In Family Family Health Status - Mother's Age - Runs In Family Heart Disease - Father Hypercholesterolemia - Mother   SOCIAL HISTORY: Marital Status: Married Preferred Language: English; Ethnicity: Not Hispanic Or Latino; Race: White Current Smoking Status: Patient does not smoke anymore. Has not smoked since 06/08/1991. Smoked for 10 years. Smoked 2 packs per day.  <DIV'  Tobacco Use Assessment Completed:  Used Tobacco in last 30 days?   Does not use smokeless tobacco. Has never drank.  Does not use  drugs. Drinks 1 caffeinated drink per day.    REVIEW OF SYSTEMS:     GU Review Male:  Patient denies burning/ pain with urination, leakage of urine, frequent urination, penile pain, get up at night to urinate, trouble starting your stream, have to strain to urinate , erection problems, hard to postpone urination, and stream starts and stops.    Gastrointestinal (Upper):  Patient denies nausea, vomiting, and indigestion/ heartburn.     Gastrointestinal (Lower):  Patient denies diarrhea and constipation.    Constitutional:  Patient denies fever, night sweats, weight loss, and fatigue.    Skin:  Patient denies skin rash/ lesion and itching.    Musculoskeletal:  Patient denies back pain and joint pain.    Neurological:  Patient denies headaches and dizziness.    Psychologic:  Patient denies depression and anxiety.    Notes: leakage around catheter at night depending on position; denies other complaints     VITAL SIGNS:       09/15/2021 01:38 PM     BP 111/70 mmHg     Pulse 85 /min     Temperature 98.4 F / 36.8 C     GU PHYSICAL EXAMINATION:      Penis: Penis uncircumcised. Penile foley catheter present. No foreskin warts, no cracks. No dorsal peyronie's plaques, no left corporal peyronie's plaques, no right corporal peyronie's plaques, no scarring, no shaft warts. No balanitis, no meatal stenosis.      MULTI-SYSTEM PHYSICAL EXAMINATION:      Constitutional: Well-nourished. No physical deformities. Normally developed. Good grooming.      Cardiovascular: Normal temperature, normal extremity pulses, no swelling, no varicosities.     Skin: No paleness, no jaundice, no cyanosis. No lesion, no ulcer, no rash.     Neurologic / Psychiatric: Oriented to time, oriented to place, oriented to person. No depression, no anxiety, no agitation.     Gastrointestinal: No mass, no tenderness, no rigidity, non obese abdomen.            Complexity of Data:   Source Of History:  Patient  Records Review:  Previous Doctor Records, Previous Patient Records   PROCEDURES: None   ASSESSMENT:     ICD-10 Details  1 GU:  Detrusor overactivity - N31.1 Chronic, Exacerbation  2  Unihibited neuropathic bladder - N31.0 Chronic, Exacerbation  3  Urinary Retention - R33.8 Chronic, Stable   PLAN:   Medications  Refill Meds: Myrbetriq 50 mg tablet, extended release 24 hr 1 tablet PO Daily #30 11 Refill(s)  Solifenacin Succinate 5 mg tablet 1  tablet PO Daily #90 3 Refill(s)    Document  Letter(s):  Created for Patient: Clinical Summary   Notes:  options discussed-He is not quite ready for a suprpubic catheter. His urethra appears intact. It may be that his catheter is having trouble staing in place do to detrusor overactitivty and bladder spasms. I discussed the option of botox with him since he is on two medications for bladder spasms. He and his family were interested in this today. I will discuss with his urologist and follow up accordingly.    Signed by Daine Gravel, NP on 09/15/21 at 6:56 PM (EDT

## 2021-10-06 NOTE — Anesthesia Procedure Notes (Signed)
Procedure Name: LMA Insertion Date/Time: 10/06/2021 12:25 PM  Performed by: Lollie Sails, CRNAPre-anesthesia Checklist: Patient identified, Emergency Drugs available, Suction available, Patient being monitored and Timeout performed Patient Re-evaluated:Patient Re-evaluated prior to induction Oxygen Delivery Method: Circle system utilized Preoxygenation: Pre-oxygenation with 100% oxygen Induction Type: IV induction Ventilation: Mask ventilation without difficulty LMA: LMA inserted LMA Size: 5.0 Number of attempts: 1 Placement Confirmation: positive ETCO2 and breath sounds checked- equal and bilateral Tube secured with: Tape Dental Injury: Teeth and Oropharynx as per pre-operative assessment

## 2021-10-06 NOTE — Op Note (Signed)
Operative Note  Preoperative diagnosis:  1.  Neurogenic bladder  Postoperative diagnosis: 1.  Neurogenic bladder 2.  3 cm bladder stone 3.  BPH with urinary retention  Procedure(s): 1.  Cystolitholapaxy greater than 2.5 cm 2.  Intradetrusor injection of 200 units of Botox 3.  Foley catheter exchange  Surgeon: Link Snuffer, MD  Assistants: None  Anesthesia: General  Complications: None immediate  EBL: Minimal  Specimens: 1.  Bladder calculi  Drains/Catheters: 1.  20 French Foley catheter  Intraoperative findings: 1.  Normal anterior urethra 2.  Moderately obstructing lateral lobes of the prostate.  He had a very large intravesical median lobe component.  Diffusely trabeculated bladder.  Chronic catheter edema but no obvious tumor. 3.  Large bladder calculus, about 3 cm.  Indication: 71 year old male with neurogenic bladder managed with Foley catheter presents for intradetrusor Botox for refractory overactive bladder symptoms.  Description of procedure:  The patient was identified and consent was obtained.  The patient was taken to the operating room and placed in the supine position.  The patient was placed under general anesthesia.  Perioperative antibiotics were administered.  The patient was placed in dorsal lithotomy.  Patient was prepped and draped in a standard sterile fashion and a timeout was performed.  A 23 French cystoscope was advanced into the urethra and into the bladder.  Complete cystoscopy was performed with findings noted above.  He was noted to have a 3 cm bladder calculus.  For this reason, laser fiber was used to break up the stone to smaller fragments and then it was extracted through the scope.  Once all stone had been evacuated, the scope was removed.  A 21 French cystoscope was advanced into the bladder.  200 units of Botox was systematically injected throughout the bladder.  There is no significant bleeding at the conclusion of the case.  Scope was  withdrawn and a 20 French Foley catheter was placed.  This include the operation.  Patient tolerated the procedure well and was stable postoperative.  Plan: Follow-up in 4 to 6 weeks for postoperative check.  Continue monthly Foley catheter changes.

## 2021-10-06 NOTE — Anesthesia Preprocedure Evaluation (Addendum)
Anesthesia Evaluation  Patient identified by MRN, date of birth, ID band Patient awake    Reviewed: Allergy & Precautions, NPO status , Patient's Chart, lab work & pertinent test results  History of Anesthesia Complications Negative for: history of anesthetic complications  Airway Mallampati: III  TM Distance: >3 FB Neck ROM: Full    Dental  (+) Dental Advisory Given   Pulmonary sleep apnea and Continuous Positive Airway Pressure Ventilation , former smoker,    Pulmonary exam normal        Cardiovascular hypertension, Pt. on medications Normal cardiovascular exam  Echo 3/21 INTERPRETATION  NORMAL LEFT VENTRICULAR SYSTOLIC FUNCTION  WITH MILD LVH  NORMAL RIGHT VENTRICULAR SYSTOLIC FUNCTION  MILD VALVULAR REGURGITATION (See above)  NO VALVULAR STENOSIS    Neuro/Psych    GI/Hepatic negative GI ROS, Neg liver ROS,   Endo/Other  negative endocrine ROS  Renal/GU Renal InsufficiencyRenal disease     Musculoskeletal negative musculoskeletal ROS (+)   Abdominal   Peds  Hematology negative hematology ROS (+)   Anesthesia Other Findings   Reproductive/Obstetrics                            Anesthesia Physical Anesthesia Plan  ASA: 3  Anesthesia Plan: General   Post-op Pain Management: Tylenol PO (pre-op)*   Induction: Intravenous  PONV Risk Score and Plan: 2 and Ondansetron and Dexamethasone  Airway Management Planned: LMA  Additional Equipment:   Intra-op Plan:   Post-operative Plan: Extubation in OR  Informed Consent: I have reviewed the patients History and Physical, chart, labs and discussed the procedure including the risks, benefits and alternatives for the proposed anesthesia with the patient or authorized representative who has indicated his/her understanding and acceptance.     Dental advisory given  Plan Discussed with: Anesthesiologist and CRNA  Anesthesia Plan  Comments:        Anesthesia Quick Evaluation

## 2021-10-07 ENCOUNTER — Telehealth: Payer: Self-pay | Admitting: *Deleted

## 2021-10-07 ENCOUNTER — Encounter (HOSPITAL_COMMUNITY): Payer: Self-pay | Admitting: Urology

## 2021-10-07 DIAGNOSIS — R350 Frequency of micturition: Secondary | ICD-10-CM | POA: Diagnosis not present

## 2021-10-07 DIAGNOSIS — Z466 Encounter for fitting and adjustment of urinary device: Secondary | ICD-10-CM | POA: Diagnosis not present

## 2021-10-07 DIAGNOSIS — R338 Other retention of urine: Secondary | ICD-10-CM | POA: Diagnosis not present

## 2021-10-07 NOTE — Patient Outreach (Signed)
  Care Coordination   10/07/2021 Name: Luke Foster MRN: [id number] DOB: Feb 04, 1951   Care Coordination Outreach Attempts:  A second unsuccessful outreach was attempted today to offer the patient with information about available care coordination services as a benefit of their health plan.     Follow Up Plan:  Additional outreach attempts will be made to offer the patient care coordination information and services.   Encounter Outcome:  No Answer  Care Coordination Interventions Activated:  No   Care Coordination Interventions:  No, not indicated    Chong Sicilian, BSN, RN-BC [place] [facility] Pharmacist, community Dial: [phone]

## 2021-10-08 NOTE — Anesthesia Postprocedure Evaluation (Signed)
Anesthesia Post Note  Patient: Luke Foster  Procedure(s) Performed: CYSTOSCOPY LITHOLAPAXY WITH BOTOX INJECTION (Penis)     Patient location during evaluation: PACU Anesthesia Type: General Level of consciousness: sedated Pain management: pain level controlled Vital Signs Assessment: post-procedure vital signs reviewed and stable Respiratory status: spontaneous breathing and respiratory function stable Cardiovascular status: stable Postop Assessment: no apparent nausea or vomiting Anesthetic complications: no   No notable events documented.  Last Vitals:  Vitals:   10/06/21 1400 10/06/21 1410  BP: 130/76 (!) 142/77  Pulse: 83 86  Resp: 16 13  Temp: (!) 36.4 C   SpO2: 96% 95%    Last Pain:  Vitals:   10/06/21 1410  TempSrc:   PainSc: 0-No pain                 Aldyn Toon,Zandon DANIEL

## 2021-10-09 DIAGNOSIS — Z466 Encounter for fitting and adjustment of urinary device: Secondary | ICD-10-CM | POA: Diagnosis not present

## 2021-10-09 DIAGNOSIS — R338 Other retention of urine: Secondary | ICD-10-CM | POA: Diagnosis not present

## 2021-10-24 DIAGNOSIS — R350 Frequency of micturition: Secondary | ICD-10-CM | POA: Diagnosis not present

## 2021-10-24 DIAGNOSIS — Z466 Encounter for fitting and adjustment of urinary device: Secondary | ICD-10-CM | POA: Diagnosis not present

## 2021-10-24 DIAGNOSIS — R338 Other retention of urine: Secondary | ICD-10-CM | POA: Diagnosis not present

## 2021-11-07 ENCOUNTER — Telehealth: Payer: Self-pay | Admitting: *Deleted

## 2021-11-07 NOTE — Patient Outreach (Signed)
  Care Coordination   11/07/2021 Name: DAGEN BEEVERS MRN: [id number] DOB: 03-20-1950   Care Coordination Outreach Attempts:  A third unsuccessful outreach was attempted today to offer the patient with information about available care coordination services as a benefit of their health plan.   Follow Up Plan:  No further outreach attempts will be made at this time. We have been unable to contact the patient to offer or enroll patient in care coordination services  Encounter Outcome:  No Answer  Care Coordination Interventions Activated:  No   Care Coordination Interventions:  No, not indicated    Valente David, RN, MSN, [facility] [place] [facility] Care Management Care Management Coordinator [phone]

## 2021-11-10 DIAGNOSIS — R350 Frequency of micturition: Secondary | ICD-10-CM | POA: Diagnosis not present

## 2021-11-10 DIAGNOSIS — Z466 Encounter for fitting and adjustment of urinary device: Secondary | ICD-10-CM | POA: Diagnosis not present

## 2021-11-10 DIAGNOSIS — R338 Other retention of urine: Secondary | ICD-10-CM | POA: Diagnosis not present

## 2021-11-11 DIAGNOSIS — Z961 Presence of intraocular lens: Secondary | ICD-10-CM | POA: Diagnosis not present

## 2021-11-11 DIAGNOSIS — H04123 Dry eye syndrome of bilateral lacrimal glands: Secondary | ICD-10-CM | POA: Diagnosis not present

## 2021-11-11 DIAGNOSIS — H35372 Puckering of macula, left eye: Secondary | ICD-10-CM | POA: Diagnosis not present

## 2021-11-21 ENCOUNTER — Emergency Department (HOSPITAL_BASED_OUTPATIENT_CLINIC_OR_DEPARTMENT_OTHER)
Admission: EM | Admit: 2021-11-21 | Discharge: 2021-11-21 | Disposition: A | Discharge status: Home / Self Care | Payer: Medicare Other | Attending: Emergency Medicine | Admitting: Emergency Medicine

## 2021-11-21 ENCOUNTER — Other Ambulatory Visit: Payer: Self-pay

## 2021-11-21 ENCOUNTER — Encounter (HOSPITAL_BASED_OUTPATIENT_CLINIC_OR_DEPARTMENT_OTHER): Payer: Self-pay | Admitting: Emergency Medicine

## 2021-11-21 DIAGNOSIS — Y732 Prosthetic and other implants, materials and accessory gastroenterology and urology devices associated with adverse incidents: Secondary | ICD-10-CM | POA: Insufficient documentation

## 2021-11-21 DIAGNOSIS — T83511A Infection and inflammatory reaction due to indwelling urethral catheter, initial encounter: Secondary | ICD-10-CM | POA: Diagnosis not present

## 2021-11-21 DIAGNOSIS — Z87891 Personal history of nicotine dependence: Secondary | ICD-10-CM | POA: Diagnosis not present

## 2021-11-21 DIAGNOSIS — T83098A Other mechanical complication of other indwelling urethral catheter, initial encounter: Secondary | ICD-10-CM | POA: Diagnosis not present

## 2021-11-21 DIAGNOSIS — N390 Urinary tract infection, site not specified: Secondary | ICD-10-CM

## 2021-11-21 DIAGNOSIS — T83091A Other mechanical complication of indwelling urethral catheter, initial encounter: Secondary | ICD-10-CM | POA: Diagnosis not present

## 2021-11-21 DIAGNOSIS — Z79899 Other long term (current) drug therapy: Secondary | ICD-10-CM | POA: Diagnosis not present

## 2021-11-21 LAB — URINALYSIS, ROUTINE W REFLEX MICROSCOPIC
Bilirubin Urine: NEGATIVE
Glucose, UA: NEGATIVE mg/dL
Ketones, ur: NEGATIVE mg/dL
Nitrite: NEGATIVE
Protein, ur: 30 mg/dL — AB
Specific Gravity, Urine: 1.022 (ref 1.005–1.030)
WBC, UA: >50 WBC/hpf — ABNORMAL HIGH (ref 0–5)
pH: 7.5 (ref 5.0–8.0)

## 2021-11-21 MED ORDER — CEFUROXIME AXETIL 500 MG PO TABS: 500.0000 mg | ORAL_TABLET | Freq: Two times a day (BID) | ORAL | 0 refills | Status: DC

## 2021-11-21 NOTE — ED Provider Notes (Signed)
[facility] Provider Note: Georgena Spurling, MD, FACEP  CSN: [id number] MRN: [id number] ARRIVAL: 11/21/21 at [place]: [id number]   CHIEF COMPLAINT  Urinary Retention   HISTORY OF PRESENT ILLNESS  11/21/21 2:17 AM Luke Foster is a 71 y.o. male with a chronic indwelling catheter due to neurogenic bladder.  The catheter apparently became occluded sometime yesterday and he is no longer able to flush it.  He has had no new urine output since about 6 PM yesterday.  He is having significant discomfort in his bladder, worse with palpation.  He is on chronic sulfamethoxazole for infection prophylaxis.  He has not had a fever or chills.   Past Medical History:  Diagnosis Date   Arthritis    Chronic back pain    Chronic pain 1999   Bilateral feet (R >L)   Difficult or painful urination    High blood pressure    History of kidney stones    H/O   Nerve damage    Pain management    Paresthesia of foot, bilateral    Renal disorder    Sleep apnea    USES CPAP   Weakness of both legs     Past Surgical History:  Procedure Laterality Date   arm surgery     BACK SURGERY     X2   BOTOX INJECTION N/A 10/06/2021   Procedure: CYSTOSCOPY LITHOLAPAXY WITH BOTOX INJECTION;  Surgeon: Lucas Mallow, MD;  Location: [facility];  Service: Urology;  Laterality: N/A;   FACIAL COSMETIC SURGERY     HERNIA REPAIR     IR US GUIDE BX ASP/DRAIN  10/19/2017   KIDNEY STONE SURGERY     LUMBAR SPINAL CORD SIMULATOR LEAD REMOVAL N/A 03/01/2019   Procedure: LUMBAR SPINAL CORD SIMULATOR LEAD REMOVAL;  Surgeon: Meade Maw, MD;  Location: [facility] ORS;  Service: Neurosurgery;  Laterality: N/A;   MASTECTOMY Left    SPINAL CORD STIMULATOR REMOVAL N/A 03/01/2019   Procedure: LUMBAR SPINAL CORD STIMULATOR REMOVAL;  Surgeon: Meade Maw, MD;  Location: [facility] ORS;  Service: Neurosurgery;  Laterality: N/A;   WISDOM TOOTH EXTRACTION      Family History  Problem Relation Age of Onset   Heart  disease Father        Living, 45   Breast cancer Mother        Living, 43   Hypercholesterolemia Mother    Healthy Brother    Healthy Sister     Social History   Tobacco Use   Smoking status: Former    Packs/day: 2.00    Years: 10.00    Total pack years: 20.00    Types: Cigarettes    Quit date: 05/21/1991    Years since quitting: 30.5   Smokeless tobacco: Former    Quit date: 06/08/1991  Vaping Use   Vaping Use: Never used  Substance Use Topics   Alcohol use: Yes    Comment: rare   Drug use: No    Prior to Admission medications   Medication Sig Start Date End Date Taking? Authorizing Provider  cefUROXime (CEFTIN) 500 MG tablet Take 1 tablet (500 mg total) by mouth 2 (two) times daily with a meal. 11/21/21  Yes Zadrian Mccauley, MD  acetaminophen (TYLENOL) 500 MG tablet Take 1,000 mg by mouth every 6 (six) hours as needed for mild pain or moderate pain.    [provider]  ascorbic acid (VITAMIN C) 500 MG tablet Take 500 mg by mouth daily.  [provider]  cholecalciferol (VITAMIN D3) 25 MCG (1000 UNIT) tablet Take 1 tablet (1,000 Units total) by mouth daily. 05/17/20   Medina-Vargas, Monina C, NP  diltiazem (CARDIZEM CD) 120 MG 24 hr capsule Take 1 capsule (120 mg total) by mouth every evening. 05/17/20   Medina-Vargas, Monina C, NP  docusate sodium (COLACE) 100 MG capsule Take 1 capsule (100 mg total) by mouth in the morning, at noon, and at bedtime. Patient taking differently: Take 100-300 mg by mouth at bedtime. 05/17/20   Medina-Vargas, Monina C, NP  enalapril (VASOTEC) 5 MG tablet Take 5 mg by mouth 2 (two) times daily. 07/17/21   [provider]  mirabegron ER (MYRBETRIQ) 50 MG TB24 tablet Take 1 tablet (50 mg total) by mouth daily. 05/17/20   Medina-Vargas, Monina C, NP  oxyCODONE (ROXICODONE) 15 MG immediate release tablet Take 1 tablet (15 mg total) by mouth every 6 (six) hours as needed for pain. 05/17/20   Medina-Vargas, Monina C, NP  polyethylene  glycol (MIRALAX / GLYCOLAX) 17 g packet Take 17 g by mouth 2 (two) times daily. Patient taking differently: Take 17 g by mouth daily as needed for mild constipation or moderate constipation. 05/17/20   Medina-Vargas, Monina C, NP  rOPINIRole (REQUIP) 0.5 MG tablet Take 0.5 mg by mouth at bedtime as needed. 09/22/21   [provider]  senna-docusate (SENOKOT-S) 8.6-50 MG tablet Take 1 tablet by mouth 2 (two) times daily. Patient taking differently: Take 2-3 tablets by mouth at bedtime. 05/02/20   Georgette Shell, MD  solifenacin (VESICARE) 5 MG tablet Take 5 mg by mouth daily. 07/17/21   [provider]  sulfamethoxazole-trimethoprim (BACTRIM) 400-80 MG tablet Take 1 tablet by mouth daily. 09/24/21   [provider]  tamsulosin (FLOMAX) 0.4 MG CAPS capsule Take 1 capsule (0.4 mg total) by mouth every evening. 05/17/20   Medina-Vargas, Monina C, NP  venlafaxine XR (EFFEXOR-XR) 75 MG 24 hr capsule Take 1 capsule (75 mg total) by mouth in the morning and at bedtime. 05/17/20   Medina-Vargas, Monina C, NP  vitamin B-12 (CYANOCOBALAMIN) 500 MCG tablet Take 1 tablet (500 mcg total) by mouth daily. Patient taking differently: Take 1,000 mcg by mouth 2 (two) times daily. 05/17/20   Medina-Vargas, Monina C, NP    Allergies Erythromycin   REVIEW OF SYSTEMS  Negative except as noted here or in the History of Present Illness.   PHYSICAL EXAMINATION  Initial Vital Signs Blood pressure 125/76, pulse (!) 110, temperature 97.6 F (36.4 C), temperature source Temporal, resp. rate 20, weight 92 kg, SpO2 98 %.  Examination General: Well-developed, well-nourished male in no acute distress; appearance consistent with age of record HENT: normocephalic; atraumatic Eyes: Normal appearance Neck: supple Heart: regular rate and rhythm Lungs: clear to auscultation bilaterally Abdomen: soft; nondistended; tender, distended bladder; bowel sounds present GU: Tanner V male, circumcised; Foley  catheter in place Extremities: No deformity; full range of motion; trace edema of lower legs Neurologic: Awake, alert and oriented Skin: Warm and dry Psychiatric: Normal mood and affect   RESULTS  Summary of this visit's results, reviewed and interpreted by myself:   EKG Interpretation  Date/Time:    Ventricular Rate:    PR Interval:    QRS Duration:   QT Interval:    QTC Calculation:   R Axis:     Text Interpretation:         Laboratory Studies: Results for orders placed or performed during the hospital encounter of 11/21/21 (  from the past 24 hour(s))  Urinalysis, Routine w reflex microscopic Urine, Clean Catch     Status: Abnormal   Collection Time: 11/21/21  2:12 AM  Result Value Ref Range   Color, Urine YELLOW YELLOW   APPearance CLOUDY (A) CLEAR   Specific Gravity, Urine 1.022 1.005 - 1.030   pH 7.5 5.0 - 8.0   Glucose, UA NEGATIVE NEGATIVE mg/dL   Hgb urine dipstick SMALL (A) NEGATIVE   Bilirubin Urine NEGATIVE NEGATIVE   Ketones, ur NEGATIVE NEGATIVE mg/dL   Protein, ur 30 (A) NEGATIVE mg/dL   Nitrite NEGATIVE NEGATIVE   Leukocytes,Ua LARGE (A) NEGATIVE   RBC / HPF 21-50 0 - 5 RBC/hpf   WBC, UA >50 (H) 0 - 5 WBC/hpf   Bacteria, UA RARE (A) NONE SEEN   Squamous Epithelial / LPF 0-5 0 - 5   WBC Clumps PRESENT    Triple Phosphate Crystal PRESENT    Imaging Studies: No results found.  ED COURSE and MDM  Nursing notes, initial and subsequent vitals signs, including pulse oximetry, reviewed and interpreted by myself.  Vitals:   11/21/21 0142 11/21/21 0143  BP: 125/76   Pulse: (!) 110   Resp: 20   Temp: 97.6 F (36.4 C)   TempSrc: Temporal   SpO2: 98%   Weight:  92 kg   Medications - No data to display  2:21 AM Foley catheter successfully replaced by nursing staff.  It is now draining grossly cloudy, yellow urine.  Urine sent for urinalysis and urine culture.  2:31 AM Urinalysis consistent with urinary tract infection.  Sediment from the UTI may  have occluded his catheter.  We will start him on antibiotics for catheter associated UTI in a male.  Urine sent for culture.  PROCEDURES  Procedures   ED DIAGNOSES     ICD-10-CM   1. Obstructed Foley catheter, initial encounter ([facility])  T83.091A     2. Urinary tract infection associated with indwelling urethral catheter, initial encounter [facility])  T83.511A    N39.0          Shanon Rosser, MD 11/21/21 [phone]

## 2021-11-21 NOTE — ED Triage Notes (Signed)
Presents for clogged foley catheter. Chronic cath use from nerve damage. States was able to flush catheter at home this AM (often clogs) but was unable to introduce any fluid to flush tonight. No output since 6pm. Reports abd pain. Denies N/V

## 2021-11-23 LAB — URINE CULTURE: Culture: 80000 — AB

## 2021-11-24 ENCOUNTER — Telehealth (HOSPITAL_BASED_OUTPATIENT_CLINIC_OR_DEPARTMENT_OTHER): Payer: Self-pay | Admitting: Emergency Medicine

## 2021-11-24 ENCOUNTER — Telehealth (HOSPITAL_BASED_OUTPATIENT_CLINIC_OR_DEPARTMENT_OTHER): Payer: Self-pay | Admitting: *Deleted

## 2021-11-24 DIAGNOSIS — N3000 Acute cystitis without hematuria: Secondary | ICD-10-CM

## 2021-11-24 MED ORDER — CIPROFLOXACIN HCL 500 MG PO TABS: 500.0000 mg | ORAL_TABLET | Freq: Two times a day (BID) | ORAL | 0 refills | Status: DC

## 2021-11-24 NOTE — Telephone Encounter (Signed)
Post ED Visit - Positive Culture Follow-up  Culture report reviewed by antimicrobial stewardship pharmacist: [place] Team '[]'$  Elenor Quinones, Pharm.D. '[]'$  Heide Guile, Pharm.D., BCPS AQ-ID '[]'$  Parks Neptune, Pharm.D., BCPS '[]'$  Alycia Rossetti, Pharm.D., BCPS '[]'$  [place], [place].D., BCPS, AAHIVP '[]'$  Legrand Como, Pharm.D., BCPS, AAHIVP '[]'$  Salome Arnt, PharmD, BCPS '[]'$  Johnnette Gourd, PharmD, BCPS '[]'$  Hughes Better, PharmD, BCPS '[]'$  Leeroy Cha, PharmD '[]'$  Laqueta Linden, PharmD, BCPS '[x]'$  Louanne Belton, PharmD  [place] Team '[]'$  Leodis Sias, PharmD '[]'$  Lindell Spar, PharmD '[]'$  Royetta Asal, PharmD '[]'$  Graylin Shiver, Rph '[]'$  Rema Fendt) Glennon Mac, PharmD '[]'$  Arlyn Dunning, PharmD '[]'$  Netta Cedars, PharmD '[]'$  Dia Sitter, PharmD '[]'$  Leone Haven, PharmD '[]'$  Gretta Arab, PharmD '[]'$  Theodis Shove, PharmD '[]'$  Peggyann Juba, PharmD '[]'$  Reuel Boom, PharmD   Positive urine culture MD from [facility] ED called in Rx for Cipro x 7 days and told patient to stop Cefuroxime and no further patient follow-up is required at this time.  Luke Foster 11/24/2021, 9:23 AM

## 2021-11-24 NOTE — Telephone Encounter (Signed)
Patient's urine culture grew out Enterobacter cloacae resistant to second-generation cephalosporin cefazolin.  Patient had been treated with a second generation cephalosporin.  We will have charge nurse call patient in the morning advising him to discontinue the current antibiotic and a prescription for Cipro 500 mg twice daily for 7 days will be sent to his pharmacy.

## 2021-11-24 NOTE — Telephone Encounter (Signed)
Patient required updated antibiotics due to his urine culture.  Previous antibiotic was prescribed using a paper prescription but requires a prescription.

## 2021-11-26 ENCOUNTER — Other Ambulatory Visit: Payer: Self-pay

## 2021-11-26 ENCOUNTER — Emergency Department (HOSPITAL_COMMUNITY)
Admission: EM | Admit: 2021-11-26 | Discharge: 2021-11-26 | Disposition: A | Discharge status: Home / Self Care | Payer: Medicare Other | Source: Home / Self Care | Attending: Emergency Medicine | Admitting: Emergency Medicine

## 2021-11-26 DIAGNOSIS — E782 Mixed hyperlipidemia: Secondary | ICD-10-CM | POA: Diagnosis not present

## 2021-11-26 DIAGNOSIS — I319 Disease of pericardium, unspecified: Secondary | ICD-10-CM | POA: Diagnosis not present

## 2021-11-26 DIAGNOSIS — N202 Calculus of kidney with calculus of ureter: Secondary | ICD-10-CM | POA: Diagnosis not present

## 2021-11-26 DIAGNOSIS — M545 Low back pain, unspecified: Secondary | ICD-10-CM | POA: Insufficient documentation

## 2021-11-26 DIAGNOSIS — R109 Unspecified abdominal pain: Secondary | ICD-10-CM | POA: Diagnosis not present

## 2021-11-26 DIAGNOSIS — K5903 Drug induced constipation: Secondary | ICD-10-CM | POA: Insufficient documentation

## 2021-11-26 DIAGNOSIS — N139 Obstructive and reflux uropathy, unspecified: Secondary | ICD-10-CM | POA: Diagnosis not present

## 2021-11-26 DIAGNOSIS — R338 Other retention of urine: Secondary | ICD-10-CM | POA: Diagnosis not present

## 2021-11-26 DIAGNOSIS — T402X5A Adverse effect of other opioids, initial encounter: Secondary | ICD-10-CM | POA: Insufficient documentation

## 2021-11-26 DIAGNOSIS — R000 Tachycardia, unspecified: Secondary | ICD-10-CM | POA: Insufficient documentation

## 2021-11-26 DIAGNOSIS — I700 Atherosclerosis of aorta: Secondary | ICD-10-CM | POA: Diagnosis not present

## 2021-11-26 DIAGNOSIS — G894 Chronic pain syndrome: Secondary | ICD-10-CM | POA: Diagnosis not present

## 2021-11-26 DIAGNOSIS — F0393 Unspecified dementia, unspecified severity, with mood disturbance: Secondary | ICD-10-CM | POA: Diagnosis not present

## 2021-11-26 DIAGNOSIS — Z881 Allergy status to other antibiotic agents status: Secondary | ICD-10-CM | POA: Diagnosis not present

## 2021-11-26 DIAGNOSIS — Z23 Encounter for immunization: Secondary | ICD-10-CM | POA: Diagnosis not present

## 2021-11-26 DIAGNOSIS — Z87891 Personal history of nicotine dependence: Secondary | ICD-10-CM | POA: Diagnosis not present

## 2021-11-26 DIAGNOSIS — K56609 Unspecified intestinal obstruction, unspecified as to partial versus complete obstruction: Secondary | ICD-10-CM | POA: Diagnosis not present

## 2021-11-26 DIAGNOSIS — I10 Essential (primary) hypertension: Secondary | ICD-10-CM | POA: Insufficient documentation

## 2021-11-26 DIAGNOSIS — R350 Frequency of micturition: Secondary | ICD-10-CM | POA: Diagnosis not present

## 2021-11-26 DIAGNOSIS — G8929 Other chronic pain: Secondary | ICD-10-CM | POA: Insufficient documentation

## 2021-11-26 DIAGNOSIS — R9431 Abnormal electrocardiogram [ECG] [EKG]: Secondary | ICD-10-CM | POA: Diagnosis not present

## 2021-11-26 DIAGNOSIS — N136 Pyonephrosis: Secondary | ICD-10-CM | POA: Diagnosis not present

## 2021-11-26 DIAGNOSIS — Z466 Encounter for fitting and adjustment of urinary device: Secondary | ICD-10-CM | POA: Diagnosis not present

## 2021-11-26 DIAGNOSIS — R339 Retention of urine, unspecified: Secondary | ICD-10-CM | POA: Diagnosis not present

## 2021-11-26 DIAGNOSIS — Z83438 Family history of other disorder of lipoprotein metabolism and other lipidemia: Secondary | ICD-10-CM | POA: Diagnosis not present

## 2021-11-26 DIAGNOSIS — Z9989 Dependence on other enabling machines and devices: Secondary | ICD-10-CM | POA: Diagnosis not present

## 2021-11-26 DIAGNOSIS — N201 Calculus of ureter: Secondary | ICD-10-CM | POA: Diagnosis not present

## 2021-11-26 DIAGNOSIS — Z79899 Other long term (current) drug therapy: Secondary | ICD-10-CM | POA: Diagnosis not present

## 2021-11-26 DIAGNOSIS — Z743 Need for continuous supervision: Secondary | ICD-10-CM | POA: Diagnosis not present

## 2021-11-26 DIAGNOSIS — N1339 Other hydronephrosis: Secondary | ICD-10-CM | POA: Diagnosis not present

## 2021-11-26 DIAGNOSIS — I499 Cardiac arrhythmia, unspecified: Secondary | ICD-10-CM | POA: Diagnosis not present

## 2021-11-26 DIAGNOSIS — Z9012 Acquired absence of left breast and nipple: Secondary | ICD-10-CM | POA: Diagnosis not present

## 2021-11-26 DIAGNOSIS — Z8249 Family history of ischemic heart disease and other diseases of the circulatory system: Secondary | ICD-10-CM | POA: Diagnosis not present

## 2021-11-26 DIAGNOSIS — Y846 Urinary catheterization as the cause of abnormal reaction of the patient, or of later complication, without mention of misadventure at the time of the procedure: Secondary | ICD-10-CM | POA: Diagnosis present

## 2021-11-26 DIAGNOSIS — N132 Hydronephrosis with renal and ureteral calculous obstruction: Secondary | ICD-10-CM | POA: Diagnosis not present

## 2021-11-26 DIAGNOSIS — R52 Pain, unspecified: Secondary | ICD-10-CM | POA: Diagnosis not present

## 2021-11-26 DIAGNOSIS — F0394 Unspecified dementia, unspecified severity, with anxiety: Secondary | ICD-10-CM | POA: Diagnosis not present

## 2021-11-26 DIAGNOSIS — K5900 Constipation, unspecified: Secondary | ICD-10-CM | POA: Diagnosis not present

## 2021-11-26 DIAGNOSIS — M5417 Radiculopathy, lumbosacral region: Secondary | ICD-10-CM | POA: Diagnosis not present

## 2021-11-26 DIAGNOSIS — G4733 Obstructive sleep apnea (adult) (pediatric): Secondary | ICD-10-CM | POA: Diagnosis not present

## 2021-11-26 DIAGNOSIS — N3000 Acute cystitis without hematuria: Secondary | ICD-10-CM | POA: Diagnosis not present

## 2021-11-26 DIAGNOSIS — K567 Ileus, unspecified: Secondary | ICD-10-CM | POA: Diagnosis not present

## 2021-11-26 DIAGNOSIS — K6389 Other specified diseases of intestine: Secondary | ICD-10-CM | POA: Diagnosis not present

## 2021-11-26 DIAGNOSIS — N179 Acute kidney failure, unspecified: Secondary | ICD-10-CM | POA: Diagnosis not present

## 2021-11-26 DIAGNOSIS — N319 Neuromuscular dysfunction of bladder, unspecified: Secondary | ICD-10-CM | POA: Diagnosis not present

## 2021-11-26 DIAGNOSIS — I4891 Unspecified atrial fibrillation: Secondary | ICD-10-CM | POA: Diagnosis not present

## 2021-11-26 DIAGNOSIS — G4730 Sleep apnea, unspecified: Secondary | ICD-10-CM | POA: Diagnosis not present

## 2021-11-26 DIAGNOSIS — T83511A Infection and inflammatory reaction due to indwelling urethral catheter, initial encounter: Secondary | ICD-10-CM | POA: Diagnosis not present

## 2021-11-26 LAB — CBC WITH DIFFERENTIAL/PLATELET
Abs Immature Granulocytes: 0.06 10*3/uL (ref 0.00–0.07)
Basophils Absolute: 0 10*3/uL (ref 0.0–0.1)
Basophils Relative: 0 %
Eosinophils Absolute: 0 10*3/uL (ref 0.0–0.5)
Eosinophils Relative: 0 %
HCT: 37.9 % — ABNORMAL LOW (ref 39.0–52.0)
Hemoglobin: 12.6 g/dL — ABNORMAL LOW (ref 13.0–17.0)
Immature Granulocytes: 0 %
Lymphocytes Relative: 3 %
Lymphs Abs: 0.5 10*3/uL — ABNORMAL LOW (ref 0.7–4.0)
MCH: 30.9 pg (ref 26.0–34.0)
MCHC: 33.2 g/dL (ref 30.0–36.0)
MCV: 92.9 fL (ref 80.0–100.0)
Monocytes Absolute: 0.9 10*3/uL (ref 0.1–1.0)
Monocytes Relative: 6 %
Neutro Abs: 13.2 10*3/uL — ABNORMAL HIGH (ref 1.7–7.7)
Neutrophils Relative %: 91 %
Platelets: 252 10*3/uL (ref 150–400)
RBC: 4.08 MIL/uL — ABNORMAL LOW (ref 4.22–5.81)
RDW: 13.2 % (ref 11.5–15.5)
WBC: 14.7 10*3/uL — ABNORMAL HIGH (ref 4.0–10.5)
nRBC: 0 % (ref 0.0–0.2)

## 2021-11-26 LAB — COMPREHENSIVE METABOLIC PANEL
ALT: 17 U/L (ref 0–44)
AST: 21 U/L (ref 15–41)
Albumin: 3.6 g/dL (ref 3.5–5.0)
Alkaline Phosphatase: 68 U/L (ref 38–126)
Anion gap: 11 (ref 5–15)
BUN: 25 mg/dL — ABNORMAL HIGH (ref 8–23)
CO2: 18 mmol/L — ABNORMAL LOW (ref 22–32)
Calcium: 9.9 mg/dL (ref 8.9–10.3)
Chloride: 108 mmol/L (ref 98–111)
Creatinine, Ser: 1.7 mg/dL — ABNORMAL HIGH (ref 0.61–1.24)
GFR, Estimated: 43 mL/min — ABNORMAL LOW (ref >60)
Glucose, Bld: 112 mg/dL — ABNORMAL HIGH (ref 70–99)
Potassium: 4.4 mmol/L (ref 3.5–5.1)
Sodium: 137 mmol/L (ref 135–145)
Total Bilirubin: 0.9 mg/dL (ref 0.3–1.2)
Total Protein: 7.2 g/dL (ref 6.5–8.1)

## 2021-11-26 LAB — TROPONIN I (HIGH SENSITIVITY)
Troponin I (High Sensitivity): 19 ng/L — ABNORMAL HIGH (ref <18)
Troponin I (High Sensitivity): 21 ng/L — ABNORMAL HIGH (ref <18)

## 2021-11-26 MED ORDER — ACETAMINOPHEN 500 MG PO TABS
1000.0000 mg | ORAL_TABLET | Freq: Once | ORAL | Status: AC
  Administered 2021-11-26: 1000 mg via ORAL
  Filled 2021-11-26: qty 2

## 2021-11-26 MED ORDER — OXYCODONE HCL 5 MG PO TABS
15.0000 mg | ORAL_TABLET | Freq: Once | ORAL | Status: AC
  Administered 2021-11-26: 15 mg via ORAL
  Filled 2021-11-26: qty 3

## 2021-11-26 MED ORDER — DILTIAZEM HCL 25 MG/5ML IV SOLN
10.0000 mg | Freq: Once | INTRAVENOUS | Status: AC
  Administered 2021-11-26: 10 mg via INTRAVENOUS
  Filled 2021-11-26: qty 5

## 2021-11-26 MED ORDER — SORBITOL 70 % SOLN
960.0000 mL | TOPICAL_OIL | Freq: Once | ORAL | Status: AC
  Administered 2021-11-26: 960 mL via RECTAL
  Filled 2021-11-26: qty 240

## 2021-11-26 NOTE — ED Triage Notes (Addendum)
Pt via EMS from home c/o back spasms intermittently. Pt has hx of spinal cord injury and has chronic foley catheter and limited mobility but is typically ambulatory with a walker at home. A/O x 4. He has not taken meds today for his pain. Denies cardiac hx but was noted to be in a fib en route and takes cardizem. Pt remains in a fib on arrival.

## 2021-11-26 NOTE — Discharge Instructions (Signed)
Your constipation is likely the result of your pain medication.  Please continue your MiraLAX daily as directed.  Drink plenty of fluids.  Follow-up with your primary care provider for recheck.  Return emergency department for any new or worsening symptoms.

## 2021-11-26 NOTE — ED Notes (Signed)
Pt had a very large bowel movement following administration of enema as prescribed. RN assisted pt with bedside cleaning and helped him change into clean brief and pants. Full linen change performed and pt was returned to bed for vital signs as noted in flowsheet. Pt states his pain has resolved. Provider notified.

## 2021-11-26 NOTE — ED Provider Notes (Signed)
[facility] EMERGENCY DEPARTMENT Provider Note   CSN: [id number] Arrival date & time: 11/26/21  1110     History  Chief Complaint  Patient presents with   Back Pain    Luke Foster is a 71 y.o. male.   Back Pain Associated symptoms: abdominal pain   Associated symptoms: no chest pain, no fever, no headaches, no numbness and no weakness        Luke Foster is a 71 y.o. male chronic pain syndrome, lumbosacral ridiculous apathy, spinal cord stimulator, hypertension, and chronic paresthesias of the bilateral feet who presents to the Emergency Department complaining of worsening spasms of his lower back and constipation.  He states that he has chronic back pain secondary to a spinal cord injury and has a chronic Foley catheter.  States he has been constipated for about 3 days.  Cramping and bloating.  Patient's brother who is at bedside also provides history.  Brother states that he has been given enema and laxative with out relief.  He states that constipation is a reoccurring problem for him given his chronic opioid dependence.  Patient describes significant pain of his lower back.  No known recent injury.  He denies any fever or chills, states his abdomen feels swollen and bloated.  No vomiting, chest pain or shortness of breath.  Home Medications Prior to Admission medications   Medication Sig Start Date End Date Taking? Authorizing Provider  acetaminophen (TYLENOL) 500 MG tablet Take 1,000 mg by mouth every 6 (six) hours as needed for mild pain or moderate pain.   Yes [provider]  ascorbic acid (VITAMIN C) 500 MG tablet Take 500 mg by mouth daily.   Yes [provider]  cholecalciferol (VITAMIN D3) 25 MCG (1000 UNIT) tablet Take 1 tablet (1,000 Units total) by mouth daily. 05/17/20  Yes Medina-Vargas, Monina C, NP  ciprofloxacin (CIPRO) 500 MG tablet Take 1 tablet (500 mg total) by mouth 2 (two) times daily. One po bid x 7 days 11/24/21  Yes Deatra Canter, Amjad, PA-C   diltiazem (CARDIZEM CD) 120 MG 24 hr capsule Take 1 capsule (120 mg total) by mouth every evening. 05/17/20  Yes Medina-Vargas, Monina C, NP  docusate sodium (COLACE) 100 MG capsule Take 1 capsule (100 mg total) by mouth in the morning, at noon, and at bedtime. Patient taking differently: Take 100-300 mg by mouth at bedtime. 05/17/20  Yes Medina-Vargas, Monina C, NP  enalapril (VASOTEC) 5 MG tablet Take 5 mg by mouth 2 (two) times daily. 07/17/21  Yes [provider]  mirabegron ER (MYRBETRIQ) 50 MG TB24 tablet Take 1 tablet (50 mg total) by mouth daily. 05/17/20  Yes Medina-Vargas, Monina C, NP  oxyCODONE (ROXICODONE) 15 MG immediate release tablet Take 1 tablet (15 mg total) by mouth every 6 (six) hours as needed for pain. 05/17/20  Yes Medina-Vargas, Monina C, NP  polyethylene glycol (MIRALAX / GLYCOLAX) 17 g packet Take 17 g by mouth 2 (two) times daily. Patient taking differently: Take 17 g by mouth daily as needed for mild constipation or moderate constipation. 05/17/20  Yes Medina-Vargas, Monina C, NP  rOPINIRole (REQUIP) 0.5 MG tablet Take 0.5 mg by mouth at bedtime as needed. 09/22/21  Yes [provider]  senna-docusate (SENOKOT-S) 8.6-50 MG tablet Take 1 tablet by mouth 2 (two) times daily. Patient taking differently: Take 2-3 tablets by mouth at bedtime. 05/02/20  Yes Georgette Shell, MD  solifenacin (VESICARE) 5 MG tablet Take 5 mg by mouth daily.  07/17/21  Yes [provider]  tamsulosin (FLOMAX) 0.4 MG CAPS capsule Take 1 capsule (0.4 mg total) by mouth every evening. 05/17/20  Yes Medina-Vargas, Monina C, NP  venlafaxine XR (EFFEXOR-XR) 75 MG 24 hr capsule Take 1 capsule (75 mg total) by mouth in the morning and at bedtime. 05/17/20  Yes Medina-Vargas, Monina C, NP  vitamin B-12 (CYANOCOBALAMIN) 500 MCG tablet Take 1 tablet (500 mcg total) by mouth daily. Patient taking differently: Take 1,000 mcg by mouth 2 (two) times daily. 05/17/20  Yes Medina-Vargas, Monina C, NP       Allergies    Erythromycin    Review of Systems   Review of Systems  Constitutional:  Negative for appetite change, chills and fever.  Respiratory:  Negative for shortness of breath and wheezing.   Cardiovascular:  Negative for chest pain.  Gastrointestinal:  Positive for abdominal pain and constipation. Negative for nausea and vomiting.  Genitourinary:  Negative for decreased urine volume.  Musculoskeletal:  Positive for back pain. Negative for neck pain and neck stiffness.  Skin:  Negative for rash.  Neurological:  Negative for weakness, numbness and headaches.    Physical Exam Updated Vital Signs BP 139/83   Pulse (!) 105   Temp 100.1 F (37.8 C) (Oral)   Resp 11   Ht '5\' 10"'$  (1.778 m)   Wt 91.9 kg   SpO2 97%   BMI 29.06 kg/m  Physical Exam Vitals and nursing note reviewed.  Constitutional:      General: He is not in acute distress.    Appearance: He is diaphoretic. He is not toxic-appearing.     Comments: Patient is very uncomfortable appearing on arrival.  Anxious appearing and mildly diaphoretic.  HENT:     Mouth/Throat:     Mouth: Mucous membranes are moist.  Eyes:     Extraocular Movements: Extraocular movements intact.     Conjunctiva/sclera: Conjunctivae normal.     Pupils: Pupils are equal, round, and reactive to light.  Cardiovascular:     Rate and Rhythm: Tachycardia present. Rhythm irregular.     Pulses: Normal pulses.  Pulmonary:     Effort: Pulmonary effort is normal. No respiratory distress.     Breath sounds: No wheezing, rhonchi or rales.  Abdominal:     General: There is no distension.     Palpations: Abdomen is soft.     Tenderness: There is abdominal tenderness. There is no right CVA tenderness, left CVA tenderness or guarding.     Comments: Diffuse tenderness abdomen.  No palpable mass, guarding or rebound.  Musculoskeletal:        General: Tenderness present.     Comments: Diffuse tenderness to palpation of the lower lumbar spine and  bilateral paraspinal muscles.    Skin:    General: Skin is warm.     Capillary Refill: Capillary refill takes less than 2 seconds.  Neurological:     General: No focal deficit present.     Mental Status: He is alert and oriented to person, place, and time.     Comments: CN II through XII grossly intact.  Speech clear.  No pronator drift.       ED Results / Procedures / Treatments   Labs (all labs ordered are listed, but only abnormal results are displayed) Labs Reviewed  CBC WITH DIFFERENTIAL/PLATELET - Abnormal; Notable for the following components:      Result Value   WBC 14.7 (*)    RBC 4.08 (*)  Hemoglobin 12.6 (*)    HCT 37.9 (*)    Neutro Abs 13.2 (*)    Lymphs Abs 0.5 (*)    All other components within normal limits  COMPREHENSIVE METABOLIC PANEL - Abnormal; Notable for the following components:   CO2 18 (*)    Glucose, Bld 112 (*)    BUN 25 (*)    Creatinine, Ser 1.70 (*)    GFR, Estimated 43 (*)    All other components within normal limits  TROPONIN I (HIGH SENSITIVITY) - Abnormal; Notable for the following components:   Troponin I (High Sensitivity) 19 (*)    All other components within normal limits    EKG EKG Interpretation  Date/Time:  Wednesday November 26 2021 13:24:52 EDT Ventricular Rate:  108 PR Interval:  166 QRS Duration: 126 QT Interval:  344 QTC Calculation: 462 R Axis:   29 Text Interpretation: Sinus tachycardia Right bundle branch block ST elevation, consider inferior injury Confirmed by Fredia Sorrow [phone]) on 11/26/2021 4:48:54 PM  Radiology No results found.  Procedures Procedures    Medications Ordered in ED Medications  sorbitol, milk of mag, mineral oil, glycerin (SMOG) enema (has no administration in time range)  diltiazem (CARDIZEM) injection 10 mg (10 mg Intravenous Given 11/26/21 1307)    ED Course/ Medical Decision Making/ A&P                           Medical Decision Making Patient here for evaluation of spasms  of his lower back that he states are recurrent and similar to previous.  He also complains of constipation and not having a bowel movement x3 days.  Patient's brother who is at bedside states that he was given enemas and laxative without relief.  Patient states constipation is also common for him given his chronic opioid dependence.  On my exam, upon entering room patient appeared to be in A-fib with RVR on cardiac monitor.  Patient's brother states this is recurring for him especially when he is been without his pain medication or is an excessive discomfort.  Takes Cardizem daily but has not taken his medications today..  Patient denies any chest pain or palpitations.  No shortness of breath.  Patient will be given single dose IV Cardizem and monitored.   I was called into the exam room as pt began complaining of right chest/upper abdominal pain after receiving the IV cardizem.  EKG was repeated read as sinus tachycardia.  Abdomen remains soft.  No distention on repeat exam.  Patient noted to have feeling of movement of gas.  Sudden onset of right upper quadrant pain felt to be related to gas.    Amount and/or Complexity of Data Reviewed Labs: ordered.    Details: Labs interpreted by me, mild leukocytosis with white count of 14,000.  No indication for infectious process.  Chemistries show mildly elevated BUN and serum creatinine this is near his baseline.  Initial troponin elevated at 19, delta troponin 21, essentially unchanged. ECG/medicine tests: ordered.    Details: EKG shows sinus tachycardia and right bundle branch block. Discussion of management or test interpretation with external provider(s): Patient given smog enema here and had a large bowel movement.  Pain was addressed with pain medication here.  He did not display atrial fibrillation on EKG.  Sinus tachycardia felt to be secondary to patient's level of pain.  The tachycardia improved once he was given pain medication and had large BM.   He  continued to deny any chest pain or shortness of breath.  On repeat abdominal exam, after patient had BM, abdomen remains soft and now nontender.  Pt seen by Dr. Roderic Palau and care plan discussed.    On recheck, pt feeling better, second trop flat.  No chest pain or dyspnea.  Pt requesting d/c home, will continue Miralax and close f/u with PCP.  Has pain medication at home for his chronic back pain.  Strict return precautions were also discussed.  Risk OTC drugs. Prescription drug management.           Final Clinical Impression(s) / ED Diagnoses Final diagnoses:  Constipation due to opioid therapy  Chronic bilateral low back pain without sciatica    Rx / DC Orders ED Discharge Orders     None         Kem Parkinson, PA-C 11/28/21 1528    Milton Ferguson, MD 12/01/21 [phone]

## 2021-11-26 NOTE — ED Notes (Signed)
Pt's HCPOA, brother Konrad Dolores, states pt has hx of recurrent constipation secondary to opioid use and has presented with similar symptoms before. POA administered a fleet enema at 0130 this morning and again at 0500 with minimal result. Previous treatment for this has included more aggressive stool softener/laxative and provided complete relief of symptoms.

## 2021-11-28 ENCOUNTER — Emergency Department (HOSPITAL_BASED_OUTPATIENT_CLINIC_OR_DEPARTMENT_OTHER): Payer: Medicare Other

## 2021-11-28 ENCOUNTER — Other Ambulatory Visit: Payer: Self-pay

## 2021-11-28 ENCOUNTER — Encounter (HOSPITAL_BASED_OUTPATIENT_CLINIC_OR_DEPARTMENT_OTHER): Payer: Self-pay

## 2021-11-28 ENCOUNTER — Inpatient Hospital Stay (HOSPITAL_BASED_OUTPATIENT_CLINIC_OR_DEPARTMENT_OTHER)
Admission: EM | Admit: 2021-11-28 | Discharge: 2021-12-01 | DRG: 660 | Disposition: A | Discharge status: Home Health | Payer: Medicare Other | Attending: Internal Medicine | Admitting: Internal Medicine

## 2021-11-28 DIAGNOSIS — Z83438 Family history of other disorder of lipoprotein metabolism and other lipidemia: Secondary | ICD-10-CM

## 2021-11-28 DIAGNOSIS — Z79899 Other long term (current) drug therapy: Secondary | ICD-10-CM

## 2021-11-28 DIAGNOSIS — N200 Calculus of kidney: Secondary | ICD-10-CM

## 2021-11-28 DIAGNOSIS — Z23 Encounter for immunization: Secondary | ICD-10-CM

## 2021-11-28 DIAGNOSIS — K6389 Other specified diseases of intestine: Secondary | ICD-10-CM | POA: Diagnosis not present

## 2021-11-28 DIAGNOSIS — N132 Hydronephrosis with renal and ureteral calculous obstruction: Secondary | ICD-10-CM | POA: Diagnosis not present

## 2021-11-28 DIAGNOSIS — I10 Essential (primary) hypertension: Secondary | ICD-10-CM | POA: Diagnosis present

## 2021-11-28 DIAGNOSIS — K5900 Constipation, unspecified: Secondary | ICD-10-CM | POA: Diagnosis present

## 2021-11-28 DIAGNOSIS — T402X5A Adverse effect of other opioids, initial encounter: Secondary | ICD-10-CM | POA: Diagnosis present

## 2021-11-28 DIAGNOSIS — I319 Disease of pericardium, unspecified: Secondary | ICD-10-CM | POA: Diagnosis present

## 2021-11-28 DIAGNOSIS — I700 Atherosclerosis of aorta: Secondary | ICD-10-CM | POA: Diagnosis present

## 2021-11-28 DIAGNOSIS — K567 Ileus, unspecified: Secondary | ICD-10-CM | POA: Diagnosis present

## 2021-11-28 DIAGNOSIS — R9431 Abnormal electrocardiogram [ECG] [EKG]: Secondary | ICD-10-CM | POA: Diagnosis present

## 2021-11-28 DIAGNOSIS — R000 Tachycardia, unspecified: Secondary | ICD-10-CM

## 2021-11-28 DIAGNOSIS — R339 Retention of urine, unspecified: Secondary | ICD-10-CM | POA: Diagnosis present

## 2021-11-28 DIAGNOSIS — Z9012 Acquired absence of left breast and nipple: Secondary | ICD-10-CM

## 2021-11-28 DIAGNOSIS — R338 Other retention of urine: Secondary | ICD-10-CM | POA: Diagnosis not present

## 2021-11-28 DIAGNOSIS — G8929 Other chronic pain: Secondary | ICD-10-CM | POA: Diagnosis present

## 2021-11-28 DIAGNOSIS — R350 Frequency of micturition: Secondary | ICD-10-CM | POA: Diagnosis not present

## 2021-11-28 DIAGNOSIS — N3000 Acute cystitis without hematuria: Secondary | ICD-10-CM

## 2021-11-28 DIAGNOSIS — Y846 Urinary catheterization as the cause of abnormal reaction of the patient, or of later complication, without mention of misadventure at the time of the procedure: Secondary | ICD-10-CM | POA: Diagnosis present

## 2021-11-28 DIAGNOSIS — R112 Nausea with vomiting, unspecified: Secondary | ICD-10-CM | POA: Diagnosis present

## 2021-11-28 DIAGNOSIS — F0394 Unspecified dementia, unspecified severity, with anxiety: Secondary | ICD-10-CM | POA: Diagnosis present

## 2021-11-28 DIAGNOSIS — G4730 Sleep apnea, unspecified: Secondary | ICD-10-CM | POA: Diagnosis present

## 2021-11-28 DIAGNOSIS — Z8249 Family history of ischemic heart disease and other diseases of the circulatory system: Secondary | ICD-10-CM

## 2021-11-28 DIAGNOSIS — E782 Mixed hyperlipidemia: Secondary | ICD-10-CM | POA: Diagnosis present

## 2021-11-28 DIAGNOSIS — N319 Neuromuscular dysfunction of bladder, unspecified: Secondary | ICD-10-CM | POA: Diagnosis present

## 2021-11-28 DIAGNOSIS — K5903 Drug induced constipation: Secondary | ICD-10-CM | POA: Diagnosis present

## 2021-11-28 DIAGNOSIS — F0393 Unspecified dementia, unspecified severity, with mood disturbance: Secondary | ICD-10-CM | POA: Diagnosis present

## 2021-11-28 DIAGNOSIS — Z881 Allergy status to other antibiotic agents status: Secondary | ICD-10-CM

## 2021-11-28 DIAGNOSIS — Z466 Encounter for fitting and adjustment of urinary device: Secondary | ICD-10-CM | POA: Diagnosis not present

## 2021-11-28 DIAGNOSIS — N179 Acute kidney failure, unspecified: Secondary | ICD-10-CM | POA: Diagnosis present

## 2021-11-28 DIAGNOSIS — K56600 Partial intestinal obstruction, unspecified as to cause: Secondary | ICD-10-CM

## 2021-11-28 DIAGNOSIS — Z87442 Personal history of urinary calculi: Secondary | ICD-10-CM

## 2021-11-28 DIAGNOSIS — T83511A Infection and inflammatory reaction due to indwelling urethral catheter, initial encounter: Principal | ICD-10-CM | POA: Diagnosis present

## 2021-11-28 DIAGNOSIS — M5417 Radiculopathy, lumbosacral region: Secondary | ICD-10-CM | POA: Diagnosis present

## 2021-11-28 DIAGNOSIS — N136 Pyonephrosis: Secondary | ICD-10-CM | POA: Diagnosis present

## 2021-11-28 DIAGNOSIS — I4510 Unspecified right bundle-branch block: Secondary | ICD-10-CM | POA: Diagnosis present

## 2021-11-28 DIAGNOSIS — N139 Obstructive and reflux uropathy, unspecified: Secondary | ICD-10-CM

## 2021-11-28 DIAGNOSIS — Z87891 Personal history of nicotine dependence: Secondary | ICD-10-CM

## 2021-11-28 LAB — CBC WITH DIFFERENTIAL/PLATELET
Abs Immature Granulocytes: 0.02 10*3/uL (ref 0.00–0.07)
Basophils Absolute: 0.1 10*3/uL (ref 0.0–0.1)
Basophils Relative: 1 %
Eosinophils Absolute: 0.1 10*3/uL (ref 0.0–0.5)
Eosinophils Relative: 1 %
HCT: 38.3 % — ABNORMAL LOW (ref 39.0–52.0)
Hemoglobin: 12.3 g/dL — ABNORMAL LOW (ref 13.0–17.0)
Immature Granulocytes: 0 %
Lymphocytes Relative: 9 %
Lymphs Abs: 0.9 10*3/uL (ref 0.7–4.0)
MCH: 30 pg (ref 26.0–34.0)
MCHC: 32.1 g/dL (ref 30.0–36.0)
MCV: 93.4 fL (ref 80.0–100.0)
Monocytes Absolute: 0.9 10*3/uL (ref 0.1–1.0)
Monocytes Relative: 9 %
Neutro Abs: 7.7 10*3/uL (ref 1.7–7.7)
Neutrophils Relative %: 80 %
Platelets: 254 10*3/uL (ref 150–400)
RBC: 4.1 MIL/uL — ABNORMAL LOW (ref 4.22–5.81)
RDW: 13.3 % (ref 11.5–15.5)
WBC: 9.7 10*3/uL (ref 4.0–10.5)
nRBC: 0 % (ref 0.0–0.2)

## 2021-11-28 LAB — COMPREHENSIVE METABOLIC PANEL
ALT: 15 U/L (ref 0–44)
AST: 21 U/L (ref 15–41)
Albumin: 4.2 g/dL (ref 3.5–5.0)
Alkaline Phosphatase: 62 U/L (ref 38–126)
Anion gap: 14 (ref 5–15)
BUN: 46 mg/dL — ABNORMAL HIGH (ref 8–23)
CO2: 24 mmol/L (ref 22–32)
Calcium: 10.7 mg/dL — ABNORMAL HIGH (ref 8.9–10.3)
Chloride: 99 mmol/L (ref 98–111)
Creatinine, Ser: 2.54 mg/dL — ABNORMAL HIGH (ref 0.61–1.24)
GFR, Estimated: 27 mL/min — ABNORMAL LOW (ref >60)
Glucose, Bld: 125 mg/dL — ABNORMAL HIGH (ref 70–99)
Potassium: 4.1 mmol/L (ref 3.5–5.1)
Sodium: 137 mmol/L (ref 135–145)
Total Bilirubin: 0.4 mg/dL (ref 0.3–1.2)
Total Protein: 7.6 g/dL (ref 6.5–8.1)

## 2021-11-28 MED ORDER — SODIUM CHLORIDE 0.9 % IV BOLUS
1000.0000 mL | Freq: Once | INTRAVENOUS | Status: AC
  Administered 2021-11-29: 1000 mL via INTRAVENOUS

## 2021-11-28 MED ORDER — IOHEXOL 300 MG/ML  SOLN
100.0000 mL | Freq: Once | INTRAMUSCULAR | Status: AC | PRN
  Administered 2021-11-28: 75 mL via INTRAVENOUS

## 2021-11-28 NOTE — ED Triage Notes (Signed)
Patient arrives with brother from c/o abdominal pain and constipation that started Tuesday evening. Pt was evaluated at [facility] on Wednesday, given enema and was able to pass bowel movement. Pt reports nausea and states abd pain is 10/10. Pt has indwelling foley that was replaced last week in the ED d/t obstruction and UTI.

## 2021-11-28 NOTE — ED Provider Notes (Signed)
[facility] EMERGENCY DEPT  Provider Note  CSN: [id number] Arrival date & time: 11/28/21 1828  History Chief Complaint  Patient presents with   Abdominal Pain   Constipation    Luke Foster is a 71 y.o. male with history of chronic back pain, with opiate induced constipation was in the [facility] 2 days ago for abdominal pain/constipation. Given an enema then with good results has been taking Miralax once a day and dulcolax three times a day without any further stools. He has had increased cramping abdominal pain and bloating since then. Nausea but no vomiting. Did not have abdominal imaging at [place].    Home Medications Prior to Admission medications   Medication Sig Start Date End Date Taking? Authorizing Provider  acetaminophen (TYLENOL) 500 MG tablet Take 1,000 mg by mouth every 6 (six) hours as needed for mild pain or moderate pain.    [provider]  ascorbic acid (VITAMIN C) 500 MG tablet Take 500 mg by mouth daily.    [provider]  cholecalciferol (VITAMIN D3) 25 MCG (1000 UNIT) tablet Take 1 tablet (1,000 Units total) by mouth daily. 05/17/20   Medina-Vargas, Monina C, NP  ciprofloxacin (CIPRO) 500 MG tablet Take 1 tablet (500 mg total) by mouth 2 (two) times daily. One po bid x 7 days 11/24/21   Evlyn Courier, PA-C  diltiazem (CARDIZEM CD) 120 MG 24 hr capsule Take 1 capsule (120 mg total) by mouth every evening. 05/17/20   Medina-Vargas, Monina C, NP  docusate sodium (COLACE) 100 MG capsule Take 1 capsule (100 mg total) by mouth in the morning, at noon, and at bedtime. Patient taking differently: Take 100-300 mg by mouth at bedtime. 05/17/20   Medina-Vargas, Monina C, NP  enalapril (VASOTEC) 5 MG tablet Take 5 mg by mouth 2 (two) times daily. 07/17/21   [provider]  mirabegron ER (MYRBETRIQ) 50 MG TB24 tablet Take 1 tablet (50 mg total) by mouth daily. 05/17/20   Medina-Vargas, Monina C, NP  oxyCODONE (ROXICODONE) 15 MG immediate release tablet  Take 1 tablet (15 mg total) by mouth every 6 (six) hours as needed for pain. 05/17/20   Medina-Vargas, Monina C, NP  polyethylene glycol (MIRALAX / GLYCOLAX) 17 g packet Take 17 g by mouth 2 (two) times daily. Patient taking differently: Take 17 g by mouth daily as needed for mild constipation or moderate constipation. 05/17/20   Medina-Vargas, Monina C, NP  rOPINIRole (REQUIP) 0.5 MG tablet Take 0.5 mg by mouth at bedtime as needed. 09/22/21   [provider]  senna-docusate (SENOKOT-S) 8.6-50 MG tablet Take 1 tablet by mouth 2 (two) times daily. Patient taking differently: Take 2-3 tablets by mouth at bedtime. 05/02/20   Georgette Shell, MD  solifenacin (VESICARE) 5 MG tablet Take 5 mg by mouth daily. 07/17/21   [provider]  tamsulosin (FLOMAX) 0.4 MG CAPS capsule Take 1 capsule (0.4 mg total) by mouth every evening. 05/17/20   Medina-Vargas, Monina C, NP  venlafaxine XR (EFFEXOR-XR) 75 MG 24 hr capsule Take 1 capsule (75 mg total) by mouth in the morning and at bedtime. 05/17/20   Medina-Vargas, Monina C, NP  vitamin B-12 (CYANOCOBALAMIN) 500 MCG tablet Take 1 tablet (500 mcg total) by mouth daily. Patient taking differently: Take 1,000 mcg by mouth 2 (two) times daily. 05/17/20   Medina-Vargas, Monina C, NP     Allergies    Erythromycin   Review of Systems   Review of Systems Please see HPI  for pertinent positives and negatives  Physical Exam BP 129/79   Pulse (!) 118   Temp 98.7 F (37.1 C) (Oral)   Resp (!) 21   Ht '5\' 10"'$  (1.778 m)   Wt 90.7 kg   SpO2 98%   BMI 28.70 kg/m   Physical Exam Vitals and nursing note reviewed.  Constitutional:      Appearance: Normal appearance.  HENT:     Head: Normocephalic and atraumatic.     Nose: Nose normal.     Mouth/Throat:     Mouth: Mucous membranes are moist.  Eyes:     Extraocular Movements: Extraocular movements intact.     Conjunctiva/sclera: Conjunctivae normal.  Cardiovascular:     Rate and Rhythm: Normal  rate.  Pulmonary:     Effort: Pulmonary effort is normal.     Breath sounds: Normal breath sounds.  Abdominal:     General: Abdomen is flat. Bowel sounds are decreased.     Palpations: Abdomen is soft.     Tenderness: There is generalized abdominal tenderness. There is no guarding. Negative signs include Murphy's sign and McBurney's sign.  Musculoskeletal:        General: No swelling. Normal range of motion.     Cervical back: Neck supple.     Right lower leg: Edema present.     Left lower leg: Edema present.  Skin:    General: Skin is warm and dry.  Neurological:     General: No focal deficit present.     Mental Status: He is alert.  Psychiatric:        Mood and Affect: Mood normal.     ED Results / Procedures / Treatments   EKG EKG Interpretation  Date/Time:  Saturday November 29 2021 04:53:04 EDT Ventricular Rate:  135 PR Interval:  134 QRS Duration: 124 QT Interval:  304 QTC Calculation: 456 R Axis:   199 Text Interpretation: Sinus tachycardia RBBB and LPFB Abnormal lateral Q waves ST elevation suggests acute pericarditis Since last tracing Rate faster Confirmed by Calvert Cantor [phone]) on 11/29/2021 5:19:42 AM  Procedures Procedures  Medications Ordered in the ED Medications  lactated ringers infusion (has no administration in time range)  ceFEPIme (MAXIPIME) 2 g in sodium chloride 0.9 % 100 mL IVPB (has no administration in time range)  sodium chloride 0.9 % bolus 1,000 mL (0 mLs Intravenous Stopped 11/29/21 0118)  iohexol (OMNIPAQUE) 300 MG/ML solution 100 mL (75 mLs Intravenous Contrast Given 11/28/21 2340)  ceFEPIme (MAXIPIME) 2 g in sodium chloride 0.9 % 100 mL IVPB (0 g Intravenous Stopped 11/29/21 0328)  fentaNYL (SUBLIMAZE) injection 50 mcg (50 mcg Intravenous Given 11/29/21 0211)  ondansetron (ZOFRAN) injection 4 mg (4 mg Intravenous Given 11/29/21 0210)  HYDROmorphone (DILAUDID) injection 1 mg (1 mg Intravenous Given 11/29/21 0400)  metoprolol  tartrate (LOPRESSOR) injection 5 mg (5 mg Intravenous Given 11/29/21 0503)  HYDROmorphone (DILAUDID) injection 1 mg (1 mg Intravenous Given 11/29/21 0611)    Initial Impression and Plan  Patient with persistent abdominal pain, likely constipation, but given return ED visit with same complaint, will re-check labs and send for CT. Overall he is well appearing, in no distress. Abdomen is without peritoneal signs.   ED Course   Clinical Course as of 11/29/21 1448  Fri Nov 28, 2021  2329 CBC with baseline anemia, WBC is improved from previous visit.  [CS]  34 CMP with AKI from 2 days ago (1.7>2.5). He had already been given reduced dose CT contrast based  on results from 10/18. Will give aggressive hydration here. [CS]  Sat Nov 29, 2021  0027 I personally viewed the images from radiology studies and agree with radiologist interpretation: CT concerning for obstructing renal stone as well as possible early SBO. Combined with recent UTI (still on Cipro) and worsening renal function, will plan admission for bowel rest, hydration and urology consult. Patient and brother at bedside amenable to that plan.  [CS]  9563 Spoke with Dr. Jeffie Pollock, Urology, who agrees with plan to admit for hydration, bowel rest. He requests NPO for possible stent in AM. Hospitalist paged.  [CS]  0112 Spoke with Dr. Marlowe Sax, Hospitalist, who will accept the patient for admission.  [CS]  0228 UA still with signs of infection. Will begin cefepime as prior culture was sensitive.  [CS]  [id number] Patient had a episode of rapid heart rate, up into the 130-140s. EKG sinus vs flutter. BP is good. Will give a dose of lopressor and continue to monitor.  [CS]  0604 Given patient may possibly need urology procedure this morning and no inpatient beds will be available for several hours, I discussed with Dr. Leonette Monarch, [facility] who will accept ED-to-ED for further management. HR improved temporarily after lopressor but has gone back up. He continues to  complain of intermittent pain, may be causing tachycardia. Will give additional pain medications prior to transfer.  [CS]    Clinical Course User Index [CS] Truddie Hidden, MD     MDM Rules/Calculators/A&P Medical Decision Making Problems Addressed: AKI (acute kidney injury) [facility]): acute illness or injury Partial small bowel obstruction [facility]): acute illness or injury Tachycardia: acute illness or injury Ureteral stone with hydronephrosis: acute illness or injury  Amount and/or Complexity of Data Reviewed Labs: ordered. Decision-making details documented in ED Course. Radiology: ordered and independent interpretation performed. Decision-making details documented in ED Course. ECG/medicine tests: ordered and independent interpretation performed. Decision-making details documented in ED Course.  Risk Prescription drug management. Parenteral controlled substances. Decision regarding hospitalization.    Final Clinical Impression(s) / ED Diagnoses Final diagnoses:  AKI (acute kidney injury) ([place])  Partial small bowel obstruction ([place])  Ureteral stone with hydronephrosis  Tachycardia    Rx / DC Orders ED Discharge Orders     None        Truddie Hidden, MD 11/29/21 [phone]

## 2021-11-29 ENCOUNTER — Emergency Department (HOSPITAL_COMMUNITY): Payer: Medicare Other

## 2021-11-29 ENCOUNTER — Encounter (HOSPITAL_COMMUNITY)
Admission: EM | Disposition: A | Discharge status: Home / Self Care | Payer: Self-pay | Source: Home / Self Care | Attending: Internal Medicine

## 2021-11-29 ENCOUNTER — Emergency Department (HOSPITAL_COMMUNITY): Payer: Medicare Other | Admitting: Certified Registered Nurse Anesthetist

## 2021-11-29 ENCOUNTER — Encounter (HOSPITAL_COMMUNITY): Payer: Self-pay

## 2021-11-29 ENCOUNTER — Encounter (HOSPITAL_COMMUNITY): Payer: Self-pay | Admitting: Internal Medicine

## 2021-11-29 DIAGNOSIS — N3000 Acute cystitis without hematuria: Secondary | ICD-10-CM | POA: Diagnosis not present

## 2021-11-29 DIAGNOSIS — R338 Other retention of urine: Secondary | ICD-10-CM | POA: Diagnosis not present

## 2021-11-29 DIAGNOSIS — I10 Essential (primary) hypertension: Secondary | ICD-10-CM | POA: Diagnosis present

## 2021-11-29 DIAGNOSIS — M5417 Radiculopathy, lumbosacral region: Secondary | ICD-10-CM | POA: Diagnosis present

## 2021-11-29 DIAGNOSIS — N179 Acute kidney failure, unspecified: Secondary | ICD-10-CM

## 2021-11-29 DIAGNOSIS — Z881 Allergy status to other antibiotic agents status: Secondary | ICD-10-CM | POA: Diagnosis not present

## 2021-11-29 DIAGNOSIS — G4733 Obstructive sleep apnea (adult) (pediatric): Secondary | ICD-10-CM

## 2021-11-29 DIAGNOSIS — Z8249 Family history of ischemic heart disease and other diseases of the circulatory system: Secondary | ICD-10-CM | POA: Diagnosis not present

## 2021-11-29 DIAGNOSIS — I319 Disease of pericardium, unspecified: Secondary | ICD-10-CM | POA: Diagnosis present

## 2021-11-29 DIAGNOSIS — Z9012 Acquired absence of left breast and nipple: Secondary | ICD-10-CM | POA: Diagnosis not present

## 2021-11-29 DIAGNOSIS — R000 Tachycardia, unspecified: Secondary | ICD-10-CM | POA: Diagnosis present

## 2021-11-29 DIAGNOSIS — N139 Obstructive and reflux uropathy, unspecified: Secondary | ICD-10-CM | POA: Diagnosis present

## 2021-11-29 DIAGNOSIS — Z23 Encounter for immunization: Secondary | ICD-10-CM | POA: Diagnosis not present

## 2021-11-29 DIAGNOSIS — E782 Mixed hyperlipidemia: Secondary | ICD-10-CM | POA: Diagnosis present

## 2021-11-29 DIAGNOSIS — I700 Atherosclerosis of aorta: Secondary | ICD-10-CM | POA: Diagnosis present

## 2021-11-29 DIAGNOSIS — Z83438 Family history of other disorder of lipoprotein metabolism and other lipidemia: Secondary | ICD-10-CM | POA: Diagnosis not present

## 2021-11-29 DIAGNOSIS — K6389 Other specified diseases of intestine: Secondary | ICD-10-CM | POA: Diagnosis not present

## 2021-11-29 DIAGNOSIS — G894 Chronic pain syndrome: Secondary | ICD-10-CM | POA: Diagnosis not present

## 2021-11-29 DIAGNOSIS — K56609 Unspecified intestinal obstruction, unspecified as to partial versus complete obstruction: Secondary | ICD-10-CM | POA: Diagnosis not present

## 2021-11-29 DIAGNOSIS — Z9989 Dependence on other enabling machines and devices: Secondary | ICD-10-CM | POA: Diagnosis not present

## 2021-11-29 DIAGNOSIS — R339 Retention of urine, unspecified: Secondary | ICD-10-CM | POA: Diagnosis present

## 2021-11-29 DIAGNOSIS — Z79899 Other long term (current) drug therapy: Secondary | ICD-10-CM | POA: Diagnosis not present

## 2021-11-29 DIAGNOSIS — F0393 Unspecified dementia, unspecified severity, with mood disturbance: Secondary | ICD-10-CM | POA: Diagnosis present

## 2021-11-29 DIAGNOSIS — Y846 Urinary catheterization as the cause of abnormal reaction of the patient, or of later complication, without mention of misadventure at the time of the procedure: Secondary | ICD-10-CM | POA: Diagnosis present

## 2021-11-29 DIAGNOSIS — G4730 Sleep apnea, unspecified: Secondary | ICD-10-CM | POA: Diagnosis present

## 2021-11-29 DIAGNOSIS — N201 Calculus of ureter: Secondary | ICD-10-CM | POA: Diagnosis not present

## 2021-11-29 DIAGNOSIS — K5903 Drug induced constipation: Secondary | ICD-10-CM | POA: Diagnosis present

## 2021-11-29 DIAGNOSIS — Z87891 Personal history of nicotine dependence: Secondary | ICD-10-CM | POA: Diagnosis not present

## 2021-11-29 DIAGNOSIS — F0394 Unspecified dementia, unspecified severity, with anxiety: Secondary | ICD-10-CM | POA: Diagnosis present

## 2021-11-29 DIAGNOSIS — R109 Unspecified abdominal pain: Secondary | ICD-10-CM | POA: Diagnosis not present

## 2021-11-29 DIAGNOSIS — N1339 Other hydronephrosis: Secondary | ICD-10-CM | POA: Diagnosis not present

## 2021-11-29 DIAGNOSIS — N319 Neuromuscular dysfunction of bladder, unspecified: Secondary | ICD-10-CM | POA: Diagnosis present

## 2021-11-29 DIAGNOSIS — T83511A Infection and inflammatory reaction due to indwelling urethral catheter, initial encounter: Secondary | ICD-10-CM | POA: Diagnosis present

## 2021-11-29 DIAGNOSIS — N202 Calculus of kidney with calculus of ureter: Secondary | ICD-10-CM | POA: Diagnosis not present

## 2021-11-29 DIAGNOSIS — K567 Ileus, unspecified: Secondary | ICD-10-CM | POA: Diagnosis present

## 2021-11-29 DIAGNOSIS — N136 Pyonephrosis: Secondary | ICD-10-CM | POA: Diagnosis present

## 2021-11-29 DIAGNOSIS — G8929 Other chronic pain: Secondary | ICD-10-CM | POA: Diagnosis present

## 2021-11-29 DIAGNOSIS — R9431 Abnormal electrocardiogram [ECG] [EKG]: Secondary | ICD-10-CM | POA: Diagnosis present

## 2021-11-29 HISTORY — PX: CYSTOSCOPY W/ URETERAL STENT PLACEMENT: SHX1429

## 2021-11-29 LAB — BASIC METABOLIC PANEL
Anion gap: 7 (ref 5–15)
BUN: 43 mg/dL — ABNORMAL HIGH (ref 8–23)
CO2: 25 mmol/L (ref 22–32)
Calcium: 9.6 mg/dL (ref 8.9–10.3)
Chloride: 105 mmol/L (ref 98–111)
Creatinine, Ser: 2.44 mg/dL — ABNORMAL HIGH (ref 0.61–1.24)
GFR, Estimated: 28 mL/min — ABNORMAL LOW (ref >60)
Glucose, Bld: 116 mg/dL — ABNORMAL HIGH (ref 70–99)
Potassium: 4.6 mmol/L (ref 3.5–5.1)
Sodium: 137 mmol/L (ref 135–145)

## 2021-11-29 LAB — URINALYSIS, ROUTINE W REFLEX MICROSCOPIC
Bilirubin Urine: NEGATIVE
Glucose, UA: NEGATIVE mg/dL
Ketones, ur: NEGATIVE mg/dL
Nitrite: NEGATIVE
Protein, ur: 30 mg/dL — AB
RBC / HPF: >50 RBC/hpf — ABNORMAL HIGH (ref 0–5)
Specific Gravity, Urine: >1.046 — ABNORMAL HIGH (ref 1.005–1.030)
WBC, UA: >50 WBC/hpf — ABNORMAL HIGH (ref 0–5)
pH: 6 (ref 5.0–8.0)

## 2021-11-29 LAB — CBC
HCT: 34.9 % — ABNORMAL LOW (ref 39.0–52.0)
Hemoglobin: 11.1 g/dL — ABNORMAL LOW (ref 13.0–17.0)
MCH: 30.4 pg (ref 26.0–34.0)
MCHC: 31.8 g/dL (ref 30.0–36.0)
MCV: 95.6 fL (ref 80.0–100.0)
Platelets: 217 10*3/uL (ref 150–400)
RBC: 3.65 MIL/uL — ABNORMAL LOW (ref 4.22–5.81)
RDW: 13.3 % (ref 11.5–15.5)
WBC: 10.8 10*3/uL — ABNORMAL HIGH (ref 4.0–10.5)
nRBC: 0 % (ref 0.0–0.2)

## 2021-11-29 LAB — MAGNESIUM: Magnesium: 2.3 mg/dL (ref 1.7–2.4)

## 2021-11-29 SURGERY — CYSTOSCOPY, WITH RETROGRADE PYELOGRAM AND URETERAL STENT INSERTION
Anesthesia: General | Site: Ureter | Laterality: Right

## 2021-11-29 MED ORDER — POLYETHYLENE GLYCOL 3350 17 G PO PACK: 17.0000 g | PACK | Freq: Every day | ORAL | Status: DC

## 2021-11-29 MED ORDER — PROPOFOL 10 MG/ML IV BOLUS
INTRAVENOUS | Status: DC | PRN
  Administered 2021-11-29: 200 mg via INTRAVENOUS

## 2021-11-29 MED ORDER — ONDANSETRON HCL 4 MG/2ML IJ SOLN
INTRAMUSCULAR | Status: AC
  Filled 2021-11-29: qty 2

## 2021-11-29 MED ORDER — HYDROMORPHONE HCL 1 MG/ML IJ SOLN: 0.2500 mg | INTRAMUSCULAR | Status: DC | PRN

## 2021-11-29 MED ORDER — PHENYLEPHRINE 80 MCG/ML (10ML) SYRINGE FOR IV PUSH (FOR BLOOD PRESSURE SUPPORT)
PREFILLED_SYRINGE | INTRAVENOUS | Status: AC
  Filled 2021-11-29: qty 20

## 2021-11-29 MED ORDER — MIDAZOLAM HCL 2 MG/2ML IJ SOLN
INTRAMUSCULAR | Status: AC
  Filled 2021-11-29: qty 2

## 2021-11-29 MED ORDER — LIDOCAINE HCL (PF) 2 % IJ SOLN
INTRAMUSCULAR | Status: AC
  Filled 2021-11-29: qty 5

## 2021-11-29 MED ORDER — OXYCODONE HCL 5 MG PO TABS: 5.0000 mg | ORAL_TABLET | Freq: Once | ORAL | Status: DC | PRN

## 2021-11-29 MED ORDER — PROMETHAZINE HCL 25 MG/ML IJ SOLN: 6.2500 mg | INTRAMUSCULAR | Status: DC | PRN

## 2021-11-29 MED ORDER — ACETAMINOPHEN 650 MG RE SUPP: 650.0000 mg | Freq: Four times a day (QID) | RECTAL | Status: DC | PRN

## 2021-11-29 MED ORDER — INFLUENZA VAC A&B SA ADJ QUAD 0.5 ML IM PRSY
0.5000 mL | PREFILLED_SYRINGE | INTRAMUSCULAR | Status: AC
  Administered 2021-12-01: 0.5 mL via INTRAMUSCULAR
  Filled 2021-11-29: qty 0.5

## 2021-11-29 MED ORDER — OXYCODONE HCL 5 MG PO TABS
15.0000 mg | ORAL_TABLET | Freq: Four times a day (QID) | ORAL | Status: DC | PRN
  Administered 2021-11-29 – 2021-12-01 (×2): 15 mg via ORAL
  Filled 2021-11-29 (×2): qty 3

## 2021-11-29 MED ORDER — PHENYLEPHRINE 80 MCG/ML (10ML) SYRINGE FOR IV PUSH (FOR BLOOD PRESSURE SUPPORT)
PREFILLED_SYRINGE | INTRAVENOUS | Status: DC | PRN
  Administered 2021-11-29 (×6): 160 ug via INTRAVENOUS

## 2021-11-29 MED ORDER — VENLAFAXINE HCL ER 75 MG PO CP24
75.0000 mg | ORAL_CAPSULE | Freq: Every day | ORAL | Status: DC
  Administered 2021-11-29 – 2021-12-01 (×3): 75 mg via ORAL
  Filled 2021-11-29 (×3): qty 1

## 2021-11-29 MED ORDER — POLYETHYLENE GLYCOL 3350 17 G PO PACK
17.0000 g | PACK | Freq: Every day | ORAL | Status: DC
  Administered 2021-11-29 – 2021-12-01 (×3): 17 g via ORAL
  Filled 2021-11-29 (×3): qty 1

## 2021-11-29 MED ORDER — VITAMIN B-12 100 MCG PO TABS
500.0000 ug | ORAL_TABLET | Freq: Every day | ORAL | Status: DC
  Administered 2021-11-29 – 2021-12-01 (×3): 500 ug via ORAL
  Filled 2021-11-29 (×3): qty 5

## 2021-11-29 MED ORDER — SODIUM CHLORIDE 0.9 % IV SOLN
2.0000 g | Freq: Once | INTRAVENOUS | Status: AC
  Administered 2021-11-29: 2 g via INTRAVENOUS
  Filled 2021-11-29: qty 12.5

## 2021-11-29 MED ORDER — ONDANSETRON HCL 4 MG/2ML IJ SOLN
4.0000 mg | Freq: Once | INTRAMUSCULAR | Status: AC
  Administered 2021-11-29: 4 mg via INTRAVENOUS
  Filled 2021-11-29: qty 2

## 2021-11-29 MED ORDER — METOPROLOL TARTRATE 5 MG/5ML IV SOLN
5.0000 mg | Freq: Once | INTRAVENOUS | Status: AC
  Administered 2021-11-29: 5 mg via INTRAVENOUS
  Filled 2021-11-29: qty 5

## 2021-11-29 MED ORDER — ACETAMINOPHEN 325 MG PO TABS
650.0000 mg | ORAL_TABLET | Freq: Four times a day (QID) | ORAL | Status: DC | PRN
  Administered 2021-11-29: 650 mg via ORAL
  Filled 2021-11-29: qty 2

## 2021-11-29 MED ORDER — FENTANYL CITRATE (PF) 100 MCG/2ML IJ SOLN
INTRAMUSCULAR | Status: AC
  Filled 2021-11-29: qty 2

## 2021-11-29 MED ORDER — OXYCODONE HCL 5 MG/5ML PO SOLN: 5.0000 mg | Freq: Once | ORAL | Status: DC | PRN

## 2021-11-29 MED ORDER — IOHEXOL 300 MG/ML  SOLN
INTRAMUSCULAR | Status: DC | PRN
  Administered 2021-11-29: 7 mL

## 2021-11-29 MED ORDER — SODIUM CHLORIDE 0.9 % IR SOLN
Status: DC | PRN
  Administered 2021-11-29: 3000 mL

## 2021-11-29 MED ORDER — HYDROMORPHONE HCL 1 MG/ML IJ SOLN
0.5000 mg | INTRAMUSCULAR | Status: DC | PRN
  Administered 2021-11-29 – 2021-12-01 (×8): 0.5 mg via INTRAVENOUS
  Filled 2021-11-29 (×4): qty 0.5
  Filled 2021-11-29: qty 1
  Filled 2021-11-29 (×4): qty 0.5

## 2021-11-29 MED ORDER — CHLORHEXIDINE GLUCONATE CLOTH 2 % EX PADS
6.0000 | MEDICATED_PAD | Freq: Every day | CUTANEOUS | Status: DC
  Administered 2021-11-30: 6 via TOPICAL

## 2021-11-29 MED ORDER — HYDROMORPHONE HCL 1 MG/ML IJ SOLN
1.0000 mg | Freq: Once | INTRAMUSCULAR | Status: AC
  Administered 2021-11-29: 1 mg via INTRAVENOUS
  Filled 2021-11-29: qty 1

## 2021-11-29 MED ORDER — PNEUMOCOCCAL 20-VAL CONJ VACC 0.5 ML IM SUSY
0.5000 mL | PREFILLED_SYRINGE | INTRAMUSCULAR | Status: AC
  Administered 2021-12-01: 0.5 mL via INTRAMUSCULAR
  Filled 2021-11-29: qty 0.5

## 2021-11-29 MED ORDER — DEXAMETHASONE SODIUM PHOSPHATE 10 MG/ML IJ SOLN
INTRAMUSCULAR | Status: AC
  Filled 2021-11-29: qty 1

## 2021-11-29 MED ORDER — DEXAMETHASONE SODIUM PHOSPHATE 10 MG/ML IJ SOLN
INTRAMUSCULAR | Status: DC | PRN
  Administered 2021-11-29: 5 mg via INTRAVENOUS

## 2021-11-29 MED ORDER — PROPOFOL 10 MG/ML IV BOLUS
INTRAVENOUS | Status: AC
  Filled 2021-11-29: qty 20

## 2021-11-29 MED ORDER — FENTANYL CITRATE (PF) 100 MCG/2ML IJ SOLN
INTRAMUSCULAR | Status: DC | PRN
  Administered 2021-11-29: 25 ug via INTRAVENOUS
  Administered 2021-11-29: 50 ug via INTRAVENOUS

## 2021-11-29 MED ORDER — STERILE WATER FOR IRRIGATION IR SOLN
Status: DC | PRN
  Administered 2021-11-29: 250 mL

## 2021-11-29 MED ORDER — FENTANYL CITRATE PF 50 MCG/ML IJ SOSY
50.0000 ug | PREFILLED_SYRINGE | Freq: Once | INTRAMUSCULAR | Status: AC
  Administered 2021-11-29: 50 ug via INTRAVENOUS
  Filled 2021-11-29: qty 1

## 2021-11-29 MED ORDER — ONDANSETRON HCL 4 MG/2ML IJ SOLN
INTRAMUSCULAR | Status: DC | PRN
  Administered 2021-11-29: 4 mg via INTRAVENOUS

## 2021-11-29 MED ORDER — ONDANSETRON HCL 4 MG/2ML IJ SOLN
4.0000 mg | Freq: Four times a day (QID) | INTRAMUSCULAR | Status: DC | PRN
  Administered 2021-11-30 (×2): 4 mg via INTRAVENOUS
  Filled 2021-11-29 (×2): qty 2

## 2021-11-29 MED ORDER — SODIUM CHLORIDE 0.9 % IV SOLN
2.0000 g | Freq: Two times a day (BID) | INTRAVENOUS | Status: DC
  Administered 2021-11-29 – 2021-12-01 (×5): 2 g via INTRAVENOUS
  Filled 2021-11-29 (×5): qty 12.5

## 2021-11-29 MED ORDER — LACTATED RINGERS IV SOLN: INTRAVENOUS | Status: DC

## 2021-11-29 MED ORDER — MIRABEGRON ER 25 MG PO TB24
50.0000 mg | ORAL_TABLET | Freq: Every day | ORAL | Status: DC
  Administered 2021-11-29 – 2021-12-01 (×3): 50 mg via ORAL
  Filled 2021-11-29 (×3): qty 2

## 2021-11-29 MED ORDER — DILTIAZEM HCL ER COATED BEADS 120 MG PO CP24
120.0000 mg | ORAL_CAPSULE | Freq: Every evening | ORAL | Status: DC
  Administered 2021-11-29: 120 mg via ORAL
  Filled 2021-11-29: qty 1

## 2021-11-29 MED ORDER — DOCUSATE SODIUM 100 MG PO CAPS
100.0000 mg | ORAL_CAPSULE | Freq: Every day | ORAL | Status: DC
  Administered 2021-11-29: 100 mg via ORAL
  Filled 2021-11-29: qty 1

## 2021-11-29 MED ORDER — MIDAZOLAM HCL 2 MG/2ML IJ SOLN
INTRAMUSCULAR | Status: DC | PRN
  Administered 2021-11-29: 1 mg via INTRAVENOUS

## 2021-11-29 MED ORDER — LIDOCAINE HCL (CARDIAC) PF 100 MG/5ML IV SOSY
PREFILLED_SYRINGE | INTRAVENOUS | Status: DC | PRN
  Administered 2021-11-29: 60 mg via INTRAVENOUS

## 2021-11-29 MED ORDER — LACTATED RINGERS IV BOLUS
1000.0000 mL | Freq: Once | INTRAVENOUS | Status: AC
  Administered 2021-11-29: 1000 mL via INTRAVENOUS

## 2021-11-29 MED ORDER — AMISULPRIDE (ANTIEMETIC) 5 MG/2ML IV SOLN: 10.0000 mg | Freq: Once | INTRAVENOUS | Status: DC | PRN

## 2021-11-29 MED ORDER — ONDANSETRON HCL 4 MG PO TABS: 4.0000 mg | ORAL_TABLET | Freq: Four times a day (QID) | ORAL | Status: DC | PRN

## 2021-11-29 SURGICAL SUPPLY — 16 items
BAG URO CATCHER STRL LF (MISCELLANEOUS) ×1 IMPLANT
CATH FOLEY 2WAY SLVR  5CC 18FR (CATHETERS) ×1
CATH FOLEY 2WAY SLVR 5CC 18FR (CATHETERS) IMPLANT
CATH URETL OPEN 5X70 (CATHETERS) IMPLANT
CLOTH BEACON ORANGE TIMEOUT ST (SAFETY) ×1 IMPLANT
GLOVE SURG SS PI 8.0 STRL IVOR (GLOVE) IMPLANT
GOWN STRL REUS W/ TWL XL LVL3 (GOWN DISPOSABLE) ×1 IMPLANT
GOWN STRL REUS W/TWL XL LVL3 (GOWN DISPOSABLE) ×1
GUIDEWIRE ANG ZIPWIRE 038X150 (WIRE) IMPLANT
GUIDEWIRE STR DUAL SENSOR (WIRE) ×1 IMPLANT
MANIFOLD NEPTUNE II (INSTRUMENTS) ×1 IMPLANT
PACK CYSTO (CUSTOM PROCEDURE TRAY) ×1 IMPLANT
STENT URET 6FRX26 CONTOUR (STENTS) IMPLANT
SYR BULB IRRIG 60ML STRL (SYRINGE) IMPLANT
TUBING CONNECTING 10 (TUBING) ×1 IMPLANT
TUBING UROLOGY SET (TUBING) IMPLANT

## 2021-11-29 NOTE — Anesthesia Postprocedure Evaluation (Signed)
Anesthesia Post Note  Patient: Luke Foster  Procedure(s) Performed: CYSTOSCOPY WITH RETROGRADE PYELOGRAM/URETERAL STENT PLACEMENT (Right: Ureter)     Patient location during evaluation: PACU Anesthesia Type: General Level of consciousness: awake and alert Pain management: pain level controlled Vital Signs Assessment: post-procedure vital signs reviewed and stable Respiratory status: spontaneous breathing, nonlabored ventilation and respiratory function stable Cardiovascular status: blood pressure returned to baseline and stable Postop Assessment: no apparent nausea or vomiting Anesthetic complications: no   No notable events documented.  Last Vitals:  Vitals:   11/29/21 1430 11/29/21 1501  BP: 132/80 (!) 142/77  Pulse: 80 83  Resp: 18 18  Temp: 36.8 C 36.8 C  SpO2: 100% 99%    Last Pain:  Vitals:   11/29/21 1501  TempSrc: Oral  PainSc: 0-No pain                 Lynda Rainwater

## 2021-11-29 NOTE — Consult Note (Signed)
Reason for Consult: Intermediate SBO Referring Physician: Dr. Delia Heady is an 71 y.o. Foster.  HPI: Patient is a 71 year old Foster who comes in secondary to abdominal pain.  Patient was found to have a obstructing kidney stone.  On CT scan patient was found to have signs consistent with intermittence SBO.  I discussed with the patient he has chronic pain and is on chronic oxycodone.  Patient states he is having no nausea or vomiting prior to this ER visit.  He states he always has issues with constipation secondary to chronic pain medication.  He recently underwent enema and had last bowel movement on Wednesday.  He does state that he has to take MiraLAX to help with bowel movements.  I did review the CT scan personally.  Past Medical History:  Diagnosis Date   Arthritis    Chronic back pain    Chronic pain 1999   Bilateral feet (R >L)   Difficult or painful urination    High blood pressure    History of kidney stones    H/O   Nerve damage    Pain management    Paresthesia of foot, bilateral    Renal disorder    Sleep apnea    USES CPAP   Weakness of both legs     Past Surgical History:  Procedure Laterality Date   arm surgery     BACK SURGERY     X2   BOTOX INJECTION N/A 10/06/2021   Procedure: CYSTOSCOPY LITHOLAPAXY WITH BOTOX INJECTION;  Surgeon: Lucas Mallow, MD;  Location: [facility];  Service: Urology;  Laterality: N/A;   FACIAL COSMETIC SURGERY     HERNIA REPAIR     IR US GUIDE BX ASP/DRAIN  10/19/2017   KIDNEY STONE SURGERY     LUMBAR SPINAL CORD SIMULATOR LEAD REMOVAL N/A 03/01/2019   Procedure: LUMBAR SPINAL CORD SIMULATOR LEAD REMOVAL;  Surgeon: Meade Maw, MD;  Location: [facility];  Service: Neurosurgery;  Laterality: N/A;   MASTECTOMY Left    SPINAL CORD STIMULATOR REMOVAL N/A 03/01/2019   Procedure: LUMBAR SPINAL CORD STIMULATOR REMOVAL;  Surgeon: Meade Maw, MD;  Location: [facility];  Service: Neurosurgery;  Laterality: N/A;   WISDOM  TOOTH EXTRACTION      Family History  Problem Relation Age of Onset   Heart disease Father        Living, 67   Breast cancer Mother        Living, 45   Hypercholesterolemia Mother    Healthy Brother    Healthy Sister     Social History:  reports that he quit smoking about 30 years ago. His smoking use included cigarettes. He has a 20.00 pack-year smoking history. He quit smokeless tobacco use about 30 years ago. He reports current alcohol use. He reports that he does not use drugs.  Allergies:  Allergies  Allergen Reactions   Erythromycin Itching    Medications: I have reviewed the patient's current medications.  Results for orders placed or performed during the hospital encounter of 11/28/21 (from the past 48 hour(s))  Comprehensive metabolic panel     Status: Abnormal   Collection Time: 11/28/21 10:59 PM  Result Value Ref Range   Sodium 137 135 - 145 mmol/L   Potassium 4.1 3.5 - 5.1 mmol/L   Chloride 99 98 - 111 mmol/L   CO2 24 22 - 32 mmol/L   Glucose, Bld 125 (H) 70 - 99 mg/dL    Comment: Glucose reference  range applies only to samples taken after fasting for at least 8 hours.   BUN 46 (H) 8 - 23 mg/dL   Creatinine, Ser 2.54 (H) 0.61 - 1.24 mg/dL   Calcium 10.7 (H) 8.9 - 10.3 mg/dL   Total Protein 7.6 6.5 - 8.1 g/dL   Albumin 4.2 3.5 - 5.0 g/dL   AST 21 15 - 41 U/L   ALT 15 0 - 44 U/L   Alkaline Phosphatase 62 38 - 126 U/L   Total Bilirubin 0.4 0.3 - 1.2 mg/dL   GFR, Estimated 27 (L) >60 mL/min    Comment: (NOTE) Calculated using the CKD-EPI Creatinine Equation (2021)    Anion gap 14 5 - 15    Comment: Performed at [facility], [address], [place], [place] [address]  CBC with Differential     Status: Abnormal   Collection Time: 11/28/21 10:59 PM  Result Value Ref Range   WBC 9.7 4.0 - 10.5 K/uL   RBC 4.10 (L) 4.22 - 5.81 MIL/uL   Hemoglobin 12.3 (L) 13.0 - 17.0 g/dL   HCT 38.3 (L) 39.0 - 52.0 %   MCV 93.4 80.0 - 100.0 fL   MCH 30.0  26.0 - 34.0 pg   MCHC 32.1 30.0 - 36.0 g/dL   RDW 13.3 11.5 - 15.5 %   Platelets 254 150 - 400 K/uL   nRBC 0.0 0.0 - 0.2 %   Neutrophils Relative % 80 %   Neutro Abs 7.7 1.7 - 7.7 K/uL   Lymphocytes Relative 9 %   Lymphs Abs 0.9 0.7 - 4.0 K/uL   Monocytes Relative 9 %   Monocytes Absolute 0.9 0.1 - 1.0 K/uL   Eosinophils Relative 1 %   Eosinophils Absolute 0.1 0.0 - 0.5 K/uL   Basophils Relative 1 %   Basophils Absolute 0.1 0.0 - 0.1 K/uL   Immature Granulocytes 0 %   Abs Immature Granulocytes 0.02 0.00 - 0.07 K/uL    Comment: Performed at [facility], [place], [place] [address]  Urinalysis, Routine w reflex microscopic Urine, Catheterized     Status: Abnormal   Collection Time: 11/29/21  1:30 AM  Result Value Ref Range   Color, Urine YELLOW YELLOW   APPearance CLEAR CLEAR   Specific Gravity, Urine >1.046 (H) 1.005 - 1.030   pH 6.0 5.0 - 8.0   Glucose, UA NEGATIVE NEGATIVE mg/dL   Hgb urine dipstick LARGE (A) NEGATIVE   Bilirubin Urine NEGATIVE NEGATIVE   Ketones, ur NEGATIVE NEGATIVE mg/dL   Protein, ur 30 (A) NEGATIVE mg/dL   Nitrite NEGATIVE NEGATIVE   Leukocytes,Ua MODERATE (A) NEGATIVE   RBC / HPF >50 (H) 0 - 5 RBC/hpf   WBC, UA >50 (H) 0 - 5 WBC/hpf   Bacteria, UA RARE (A) NONE SEEN   Squamous Epithelial / LPF 0-5 0 - 5   WBC Clumps PRESENT    Mucus PRESENT    Ca Oxalate Crys, UA PRESENT     Comment: Performed at [facility], [address], [place], [place] [address]    CT Abdomen Pelvis W Contrast  Result Date: 11/29/2021 CLINICAL DATA:  Abdominal pain, constipation and suspected bowel obstruction. EXAM: CT ABDOMEN AND PELVIS WITH CONTRAST TECHNIQUE: Multidetector CT imaging of the abdomen and pelvis was performed using the standard protocol following bolus administration of intravenous contrast. RADIATION DOSE REDUCTION: This exam was performed according to the departmental dose-optimization program which  includes automated exposure control, adjustment of the mA  and/or kV according to patient size and/or use of iterative reconstruction technique. CONTRAST:  41m OMNIPAQUE IOHEXOL 300 MG/ML  SOLN COMPARISON:  CT without contrast 04/29/2020 FINDINGS: Lower chest: There are increasing atelectatic bands in both lung bases but no acute consolidative pneumonia. The cardiac size is normal. There calcifications in the aortic valve plane. No pericardial effusion. Hepatobiliary: There is mild hepatic steatosis without mass enhancement. Gallbladder is distended to 11.8 cm in length but no calcified stones or wall thickening are noted and no biliary dilatation. Pancreas: Moderately fatty replaced. No mass enhancement or inflammatory change. Spleen: Unremarkable. Adrenals/Urinary Tract: There is no adrenal mass. Again noted is a 12 cm uncomplicated thin walled homogeneous cyst in the lower pole of the right kidney measuring 12 cm and 1.6 Hounsfield units, and a left renal cyst laterally in the lower pole measuring 3 cm and 1.2 Hounsfield units. There is a 3 mm nonobstructive caliceal stone in the inferior pole left kidney. No other left renal stone is seen. There are scattered nonobstructing stones within the right kidney collecting system measuring from 2-4 mm. There is new demonstration of moderate right hydroureteronephrosis due to a 7 x 4 x 5 mm distal right ureteral stone approximately 6 cm from the UVJ. In the delayed phase no cortical contrast excretion is seen on the right but it is well seen on the left. The bladder is contracted around a Foley balloon and not well seen. Stomach/Bowel: The gastric wall is unremarkable. The duodenum and jejunum are decompressed but there are mildly dilated ileal segments in the mid to lower abdomen primarily to the left measuring up to 3.2 cm, without a visible transitional segment and with decompression of the right lower and low central abdominal small bowel. The appendix is normal.  There is abundant fecal material in the ascending and transverse colon, left colonic diverticula without evidence of diverticulitis or colitis. Vascular/Lymphatic: Aortic atherosclerosis. No enlarged abdominal or pelvic lymph nodes. Reproductive: Enlarged prostate, 6 cm transverse diameter bladder impression, unchanged in size. Other: Small umbilical fat hernia. Small left inguinal fat hernia. Multiple pelvic phleboliths. There is no free air, free hemorrhage, free fluid or acute inflammatory changes. Musculoskeletal: Osteopenia. Old L4-S1 posterior fusion construct with solid fusions. Chronic T12 and L1 compression fractures are again noted. No acute or other significant osseous findings. IMPRESSION: 1. 7 x 4 x 5 mm distal right ureteral stone 6 cm from the UVJ, with moderate obstructive uropathy. Correlate clinically for infectious complication. 2. Nonobstructive nephrolithiasis. 3. Multiple mildly dilated fluid-filled small bowel segments primarily the left mid to lower abdomen, decompression in the low central 2 right lower quadrant small bowel. 4. Intermediate grade small bowel obstruction suspected but etiology is indeterminate as the transitional segment could not be found. Possibilities include occult adhesions and occult internal hernia. There is no bowel pneumatosis or mesenteric edema. Consultation with a surgeon is recommended. 5. Abundant stool ascending and transverse colon. Diverticula without diverticulitis. 6. Renal cysts. 7. Enlarged prostate with bladder impression, bladder catheterization. 8. Osteopenia, degenerative and postsurgical changes of the spine. Right small umbilical and left inguinal fat hernias. 9. Distended but otherwise unremarkable gallbladder. Electronically Signed   By: KTelford NabM.D.   On: 11/29/2021 00:20    Review of Systems  Constitutional: Negative.   HENT: Negative.    Eyes: Negative.   Respiratory: Negative.    Cardiovascular: Negative.   Gastrointestinal:  Negative.   Endocrine: Negative.   Neurological: Negative.    Blood pressure 109/70, pulse (Marland Kitchen  111, temperature 99.1 F (37.3 C), temperature source Oral, resp. rate Luke, height '5\' 10"'$  (1.778 m), weight 90.7 kg, SpO2 97 %. Physical Exam Constitutional:      Appearance: He is well-developed.     Comments: Conversant No acute distress  HENT:     Head: Normocephalic and atraumatic.  Eyes:     General: Lids are normal. No scleral icterus.    Pupils: Pupils are equal, round, and reactive to light.     Comments: Pupils are equal round and reactive No lid lag Moist conjunctiva  Neck:     Thyroid: No thyromegaly.     Trachea: No tracheal tenderness.     Comments: No cervical lymphadenopathy Cardiovascular:     Rate and Rhythm: Normal rate and regular rhythm.     Heart sounds: No murmur heard. Pulmonary:     Effort: Pulmonary effort is normal.     Breath sounds: Normal breath sounds. No wheezing or rales.  Abdominal:     Tenderness: There is no abdominal tenderness. There is no guarding or rebound.     Hernia: No hernia is present.  Musculoskeletal:     Cervical back: Normal range of motion and neck supple.  Skin:    General: Skin is warm.     Findings: No rash.     Nails: There is no clubbing.     Comments: Normal skin turgor  Neurological:     Mental Status: He is alert and oriented to person, place, and time.     Comments: Normal gait and station  Psychiatric:        Mood and Affect: Mood normal.        Thought Content: Thought content normal.        Judgment: Judgment normal.     Comments: Appropriate affect     Assessment/Plan: 71 year old Foster with obstructing kidney stone. Medication induced Ileus  1.  At this time patient with no acute changes in his bowel movements.  Would just recommend continued MiraLAX while in the hospital as he is likely constipated from chronic pain medication.  Patient with no signs or symptoms of peritonitis. 2.  Urology to assess for  obstructing kidney stone. 3.  Please call us back if needed.  Ralene Ok 11/29/2021, 9:43 AM

## 2021-11-29 NOTE — Plan of Care (Signed)
[facility] will assume care on arrival to accepting facility. Until arrival, care as per EDP. However, [facility] available 24/7 for questions and assistance.  Nursing staff, please page [facility] Admits and Consults ([phone]) as soon as the patient arrives to the hospital.   

## 2021-11-29 NOTE — Anesthesia Procedure Notes (Signed)
Procedure Name: LMA Insertion Date/Time: 11/29/2021 1:23 PM  Performed by: Raenette Rover, CRNAPre-anesthesia Checklist: Patient identified, Emergency Drugs available, Suction available and Patient being monitored Patient Re-evaluated:Patient Re-evaluated prior to induction Oxygen Delivery Method: Circle system utilized Preoxygenation: Pre-oxygenation with 100% oxygen Induction Type: IV induction LMA: LMA inserted LMA Size: 4.0 Number of attempts: 1 Placement Confirmation: positive ETCO2 and breath sounds checked- equal and bilateral Tube secured with: Tape Dental Injury: Teeth and Oropharynx as per pre-operative assessment

## 2021-11-29 NOTE — Transfer of Care (Signed)
Immediate Anesthesia Transfer of Care Note  Patient: Luke Foster  Procedure(s) Performed: CYSTOSCOPY WITH RETROGRADE PYELOGRAM/URETERAL STENT PLACEMENT (Right: Ureter)  Patient Location: PACU  Anesthesia Type:General  Level of Consciousness: awake, alert , oriented and patient cooperative  Airway & Oxygen Therapy: Patient Spontanous Breathing and Patient connected to nasal cannula oxygen  Post-op Assessment: Report given to RN and Post -op Vital signs reviewed and stable  Post vital signs: Reviewed and stable  Last Vitals:  Vitals Value Taken Time  BP 131/74 11/29/21 1407  Temp 36.8 C 11/29/21 1405  Pulse 90 11/29/21 1408  Resp 14 11/29/21 1408  SpO2 100 % 11/29/21 1408  Vitals shown include unvalidated device data.  Last Pain:  Vitals:   11/29/21 0824  TempSrc: Oral  PainSc:          Complications: No notable events documented.

## 2021-11-29 NOTE — Anesthesia Preprocedure Evaluation (Signed)
Anesthesia Evaluation  Patient identified by MRN, date of birth, ID band Patient awake    Reviewed: Allergy & Precautions, NPO status , Patient's Chart, lab work & pertinent test results  History of Anesthesia Complications Negative for: history of anesthetic complications  Airway Mallampati: III  TM Distance: >3 FB Neck ROM: Full    Dental  (+) Dental Advisory Given   Pulmonary sleep apnea and Continuous Positive Airway Pressure Ventilation , former smoker,    Pulmonary exam normal        Cardiovascular hypertension, Pt. on medications Normal cardiovascular exam  Echo 3/21 INTERPRETATION  NORMAL LEFT VENTRICULAR SYSTOLIC FUNCTION  WITH MILD LVH  NORMAL RIGHT VENTRICULAR SYSTOLIC FUNCTION  MILD VALVULAR REGURGITATION (See above)  NO VALVULAR STENOSIS    Neuro/Psych    GI/Hepatic negative GI ROS, Neg liver ROS,   Endo/Other  negative endocrine ROS  Renal/GU Renal InsufficiencyRenal disease     Musculoskeletal  (+) Arthritis , Osteoarthritis,    Abdominal   Peds  Hematology negative hematology ROS (+)   Anesthesia Other Findings   Reproductive/Obstetrics                             Anesthesia Physical  Anesthesia Plan  ASA: 3 and emergent  Anesthesia Plan: General   Post-op Pain Management:    Induction: Intravenous  PONV Risk Score and Plan: 2 and Ondansetron, Midazolam and Treatment may vary due to age or medical condition  Airway Management Planned: LMA  Additional Equipment:   Intra-op Plan:   Post-operative Plan: Extubation in OR  Informed Consent: I have reviewed the patients History and Physical, chart, labs and discussed the procedure including the risks, benefits and alternatives for the proposed anesthesia with the patient or authorized representative who has indicated his/her understanding and acceptance.     Dental advisory given  Plan Discussed with:  Anesthesiologist and CRNA  Anesthesia Plan Comments:         Anesthesia Quick Evaluation

## 2021-11-29 NOTE — ED Notes (Signed)
new ekg captured per md verbal orders due to change in HR

## 2021-11-29 NOTE — Op Note (Signed)
Procedure: 1.  Cystoscopy with right retrograde pyelogram and interpretation. 2.  Right ureteroscopy with insertion of right double-J stent.  3 application of fluoroscopy.  Preop diagnosis: 7 mm right distal ureteral stones with obstruction and acute kidney injury.  Postop diagnosis: Same.  Surgeon: Dr. Irine Seal.  Anesthesia: General.  Specimen: None.  Drains: 6 French by 24 cm right contour double-J stent without tether and 18 French Foley catheter.  EBL: None.  Complications: Ureteral perforation.  Indications: The patient is a 71 year old male with a history of stones and a chronic Foley catheter.  He was seen in the [facility] ER last night with concerns for constipation but was found on CT to have a 7 mm right distal ureteral stone with obstruction and acute kidney injury and a possible small bowel obstruction.  He was he was sent to [facility] for admission and has been seen by general surgery who felt that he had constipation and not a bowel obstruction but it was felt that cystoscopy with stent placement was indicated for his acute kidney injury with the right distal ureteral stone.  Procedure: He had been given antibiotics in the emergency room.  He was taken operating room where general anesthetic was induced.  He was placed in lithotomy position and fitted with PAS hose.  His perineum genitalia were prepped with Betadine solution and draped in usual sterile fashion.  Cystoscopy was performed using a 21 [place] scope and 30 degree lens.  Examination revealed a normal urethra.  The external sphincter was intact.  The prostatic urethra had trilobar hyperplasia with a large middle lobe and some lateral lobe obstruction.  Examination of bladder revealed mild trabeculation with some mild chronic inflammatory changes of the bladder wall.  Ureteral orifices were unremarkable.  The right ureteral orifice was cannulated with a 5 French opening catheter but the use of a sensor wire was  required to negotiate the J-hook distal ureter.  Once the ureteral catheter was in the distal ureter, the sensor wire was removed and a right retrograde pyelogram was performed with Omnipaque.  The right retrograde pyelogram demonstrated a normal-appearing distal ureter but there was no flow of contrast beyond a filling defect at the level of the stone.  The stone was not terribly radiopaque.  I then attempted to pass a sensor wire by the stone but there was resistance but it eventually passed however I was concerned that it was extra ureteral.  The 5 [place] open-ended catheter was advanced over the wire just past the stone and the wire was removed.  There is no hydronephrotic drip and injection of additional contrast demonstrated an extra ureteral position.  I then backed the ureteral catheter down and attempted to use an angled tip Glidewire but it would just coil below the stone so I elected to pass a short 6.5 French dual-lumen ureteroscope and was successfully able to get that up to the level of the stone.  I could see the ureteral perforation on the posterior wall of the ureter I was able to pass the sensor wire in the more anterior location successfully to the kidney as determined by fluoroscopic guidance.  Once the wire was in good position the ureteroscope was removed and the cystoscope was replaced over the wire.  A 6 French by 26 cm contour double-J stent was then advanced to the kidney under fluoroscopic guidance.  The wire was removed, leaving good coil in the kidney and a good coil in the bladder.  The cystoscope was  then removed and an 43 French Foley catheter was inserted.  The balloon was filled with 10 mL of sterile fluid and the catheter was placed to straight drainage.  He was taken down from lithotomy position, his anesthetic was reversed and he was moved to recovery room in stable condition.  Procedure was complicated by the ureteral perforation but stent placement was successful.

## 2021-11-29 NOTE — Progress Notes (Signed)
Pharmacy Antibiotic Note  Luke KAUFHOLD is a 71 y.o. male admitted on 11/28/2021 with UTI.  Pharmacy has been consulted for Cefepime dosing.  Plan: Cefepime 2 g IV q12h  Height: '5\' 10"'$  (177.8 cm) Weight: 90.7 kg (200 lb) IBW/kg (Calculated) : 73  Temp (24hrs), Avg:98.8 F (37.1 C), Min:98.5 F (36.9 C), Max:99.1 F (37.3 C)  Recent Labs  Lab 11/26/21 1233 11/28/21 2259  WBC 14.7* 9.7  CREATININE 1.70* 2.54*    Estimated Creatinine Clearance: 31.1 mL/min (A) (by C-G formula based on SCr of 2.54 mg/dL (H)).    Allergies  Allergen Reactions   Erythromycin Itching     Caryl Pina 11/29/2021 3:18 AM

## 2021-11-29 NOTE — Progress Notes (Signed)
Mobility Specialist - Progress Note   11/29/21 1634  Mobility  Activity Ambulated with assistance in hallway  Level of Assistance Moderate assist, patient does 50-74%  Assistive Device Front wheel walker  Distance Ambulated (ft) 50 ft  Range of Motion/Exercises Active  Activity Response Tolerated well  Mobility Referral Yes  $Mobility charge 1 Mobility   Pt was found in bed and agreeable to ambulate. His legs and feet were swollen before and while ambulating. Pt was contact guard for ambulating and mod-A for getting to bed due to needing assistance with legs. At EOS was left in bed with all necessities in reach and sister in room.  Ferd Hibbs Mobility Specialist

## 2021-11-29 NOTE — ED Notes (Signed)
Report given to [facility] ED charge jeremy, [facility] taking pt via stretcher at this time.

## 2021-11-29 NOTE — ED Notes (Signed)
Pt having 10/10 flank pain, md notified

## 2021-11-29 NOTE — Consult Note (Signed)
Subjective:    Consult requested by Dr. Tennis Must.   Luke Foster is a 71 yo male with a history of stones and urinary retention with a chronic foley.   He has had recent issues with constipation related to chronic opioids and present to the [facility] last night with concerns about recurrent constipation.  He was also complaining of right flank pain but no nausea.  He had no fever or chills or associated signs or symptoms.   A CT was done that demonstrated a 81m right distal stone with obstruction and some additional renal stones.  He was recently placed on Cipro for a UTI but has not been taking it as prescribed.  His Cr has risen from 1.71 on 10/18 to 2.54 on 10/20.   He has a low grade fever and tachycardia.  His UA has >50 WBC and RBC's but rare bacteria.   He had a cystolithalopaxy with botox injection about a month ago for bladder stones and leakage around the foley and remains on tamsulosin, solifenacin and myrbetriq for OAB.   He has been seen by Dr. RRosendo Groswho felt his distention was secondary to constipation and not SBO.  ROS:  Review of Systems  Gastrointestinal:  Positive for constipation.  Genitourinary:  Positive for flank pain and urgency.  All other systems reviewed and are negative.   Allergies  Allergen Reactions   Erythromycin Itching    Past Medical History:  Diagnosis Date   Arthritis    Chronic back pain    Chronic pain 1999   Bilateral feet (R >L)   Difficult or painful urination    High blood pressure    History of kidney stones    H/O   Nerve damage    Pain management    Paresthesia of foot, bilateral    Renal disorder    Sleep apnea    USES CPAP   Weakness of both legs     Past Surgical History:  Procedure Laterality Date   arm surgery     BACK SURGERY     X2   BOTOX INJECTION N/A 10/06/2021   Procedure: CYSTOSCOPY LITHOLAPAXY WITH BOTOX INJECTION;  Surgeon: BLucas Mallow MD;  Location: [facility];  Service: Urology;  Laterality: N/A;    FACIAL COSMETIC SURGERY     HERNIA REPAIR     IR [place] BX ASP/DRAIN  10/19/2017   KIDNEY STONE SURGERY     LUMBAR SPINAL CORD SIMULATOR LEAD REMOVAL N/A 03/01/2019   Procedure: LUMBAR SPINAL CORD SIMULATOR LEAD REMOVAL;  Surgeon: YMeade Maw MD;  Location: [facility];  Service: Neurosurgery;  Laterality: N/A;   MASTECTOMY Left    SPINAL CORD STIMULATOR REMOVAL N/A 03/01/2019   Procedure: LUMBAR SPINAL CORD STIMULATOR REMOVAL;  Surgeon: YMeade Maw MD;  Location: [facility];  Service: Neurosurgery;  Laterality: N/A;   WISDOM TOOTH EXTRACTION      Social History   Socioeconomic History   Marital status: Single    Spouse name: Not on file   Number of children: 0   Years of education: Not on file   Highest education level: High school graduate  Occupational History   Not on file  Tobacco Use   Smoking status: Former    Packs/day: 2.00    Years: 10.00    Total pack years: 20.00    Types: Cigarettes    Quit date: 05/21/1991    Years since quitting: 30.5   Smokeless tobacco: Former  Quit date: 06/08/1991  Vaping Use   Vaping Use: Never used  Substance and Sexual Activity   Alcohol use: Yes    Comment: rare   Drug use: No   Sexual activity: Not on file  Other Topics Concern   Not on file  Social History Narrative   Lives with wife.   He is on disability since 2011 due to chronic back pain.      Caffeine - one mountain dew in the morning    Social Determinants of Health   Financial Resource Strain: Not on file  Food Insecurity: Not on file  Transportation Needs: Not on file  Physical Activity: Not on file  Stress: Not on file  Social Connections: Not on file  Intimate Partner Violence: Not on file    Family History  Problem Relation Age of Onset   Heart disease Father        Living, 96   Breast cancer Mother        Living, 43   Hypercholesterolemia Mother    Healthy Brother    Healthy Sister     Anti-infectives: Anti-infectives (From  admission, onward)    Start     Dose/Rate Route Frequency Ordered Stop   11/29/21 1200  ceFEPIme (MAXIPIME) 2 g in sodium chloride 0.9 % 100 mL IVPB        2 g 200 mL/hr over 30 Minutes Intravenous Every 12 hours 11/29/21 0319     11/29/21 0130  ceFEPIme (MAXIPIME) 2 g in sodium chloride 0.9 % 100 mL IVPB        2 g 200 mL/hr over 30 Minutes Intravenous  Once 11/29/21 0116 11/29/21 0328       Current Facility-Administered Medications  Medication Dose Route Frequency Provider Last Rate Last Admin   acetaminophen (TYLENOL) tablet 650 mg  650 mg Oral Q6H PRN Reubin Milan, MD       Or   acetaminophen (TYLENOL) suppository 650 mg  650 mg Rectal Q6H PRN Reubin Milan, MD       ceFEPIme (MAXIPIME) 2 g in sodium chloride 0.9 % 100 mL IVPB  2 g Intravenous Q12H Truddie Hidden, MD       HYDROmorphone (DILAUDID) injection 0.5 mg  0.5 mg Intravenous Q3H PRN Reubin Milan, MD       lactated ringers infusion   Intravenous Continuous Truddie Hidden, MD       ondansetron [facility]) tablet 4 mg  4 mg Oral Q6H PRN Reubin Milan, MD       Or   ondansetron [facility]) injection 4 mg  4 mg Intravenous Q6H PRN Reubin Milan, MD       [START ON 11/30/2021] polyethylene glycol (MIRALAX / Floria Raveling) packet 17 g  17 g Oral Daily Reubin Milan, MD       Current Outpatient Medications  Medication Sig Dispense Refill   acetaminophen (TYLENOL) 500 MG tablet Take 1,000 mg by mouth every 6 (six) hours as needed for mild pain or moderate pain.     ascorbic acid (VITAMIN C) 500 MG tablet Take 500 mg by mouth daily.     cholecalciferol (VITAMIN D3) 25 MCG (1000 UNIT) tablet Take 1 tablet (1,000 Units total) by mouth daily. (Patient taking differently: Take 2,000 Units by mouth daily.) 30 tablet 0   ciprofloxacin (CIPRO) 500 MG tablet Take 1 tablet (500 mg total) by mouth 2 (two) times daily. One po bid x 7 days (Patient taking differently: Take  500 mg by mouth 2 (two) times daily.)  14 tablet 0   diltiazem (CARDIZEM CD) 120 MG 24 hr capsule Take 1 capsule (120 mg total) by mouth every evening. 30 capsule 0   docusate sodium (COLACE) 100 MG capsule Take 1 capsule (100 mg total) by mouth in the morning, at noon, and at bedtime. (Patient taking differently: Take 100-300 mg by mouth at bedtime.) 90 capsule 0   enalapril (VASOTEC) 5 MG tablet Take 5 mg by mouth 2 (two) times daily.     mirabegron ER (MYRBETRIQ) 50 MG TB24 tablet Take 1 tablet (50 mg total) by mouth daily. 30 tablet 0   oxyCODONE (ROXICODONE) 15 MG immediate release tablet Take 1 tablet (15 mg total) by mouth every 6 (six) hours as needed for pain. 30 tablet 0   polyethylene glycol (MIRALAX / GLYCOLAX) 17 g packet Take 17 g by mouth 2 (two) times daily. (Patient taking differently: Take 17 g by mouth daily as needed for mild constipation or moderate constipation.) 14 each 3   rOPINIRole (REQUIP) 0.5 MG tablet Take 0.5 mg by mouth at bedtime as needed (restless legs).     senna-docusate (SENOKOT-S) 8.6-50 MG tablet Take 1 tablet by mouth 2 (two) times daily. (Patient taking differently: Take 2-3 tablets by mouth at bedtime.)     solifenacin (VESICARE) 5 MG tablet Take 5 mg by mouth daily.     sulfamethoxazole-trimethoprim (BACTRIM DS) 800-160 MG tablet Take 1 tablet by mouth every evening.     tamsulosin (FLOMAX) 0.4 MG CAPS capsule Take 1 capsule (0.4 mg total) by mouth every evening. 30 capsule 0   venlafaxine XR (EFFEXOR-XR) 75 MG 24 hr capsule Take 1 capsule (75 mg total) by mouth in the morning and at bedtime. 60 capsule 0   vitamin B-12 (CYANOCOBALAMIN) 500 MCG tablet Take 1 tablet (500 mcg total) by mouth daily. (Patient taking differently: Take 1,000 mcg by mouth 2 (two) times daily.) 30 tablet 0     Objective: Vital signs in last 24 hours: BP 109/70 (BP Location: Left Arm)   Pulse (!) 111   Temp 99.1 F (37.3 C) (Oral)   Resp 18   Ht '5\' 10"'$  (1.778 m)   Wt 90.7 kg   SpO2 97%   BMI 28.70 kg/m    Intake/Output from previous day: 10/20 0701 - 10/21 0700 In: 1100.8 [IV Piggyback:1100.8] Out: -  Intake/Output this shift: No intake/output data recorded.   Physical Exam Vitals reviewed.  Constitutional:      Appearance: He is well-developed. He is obese.  Cardiovascular:     Rate and Rhythm: Regular rhythm. Tachycardia present.     Heart sounds: Normal heart sounds.  Pulmonary:     Effort: Pulmonary effort is normal.  Abdominal:     General: Abdomen is protuberant. Bowel sounds are decreased. There is distension.     Tenderness: There is right CVA tenderness.  Skin:    General: Skin is warm and dry.  Neurological:     General: No focal deficit present.     Mental Status: He is alert and oriented to person, place, and time.     Lab Results:  Results for orders placed or performed during the hospital encounter of 11/28/21 (from the past 24 hour(s))  Comprehensive metabolic panel     Status: Abnormal   Collection Time: 11/28/21 10:59 PM  Result Value Ref Range   Sodium 137 135 - 145 mmol/L   Potassium 4.1 3.5 - 5.1 mmol/L  Chloride 99 98 - 111 mmol/L   CO2 24 22 - 32 mmol/L   Glucose, Bld 125 (H) 70 - 99 mg/dL   BUN 46 (H) 8 - 23 mg/dL   Creatinine, Ser 2.54 (H) 0.61 - 1.24 mg/dL   Calcium 10.7 (H) 8.9 - 10.3 mg/dL   Total Protein 7.6 6.5 - 8.1 g/dL   Albumin 4.2 3.5 - 5.0 g/dL   AST 21 15 - 41 U/L   ALT 15 0 - 44 U/L   Alkaline Phosphatase 62 38 - 126 U/L   Total Bilirubin 0.4 0.3 - 1.2 mg/dL   GFR, Estimated 27 (L) >60 mL/min   Anion gap 14 5 - 15  CBC with Differential     Status: Abnormal   Collection Time: 11/28/21 10:59 PM  Result Value Ref Range   WBC 9.7 4.0 - 10.5 K/uL   RBC 4.10 (L) 4.22 - 5.81 MIL/uL   Hemoglobin 12.3 (L) 13.0 - 17.0 g/dL   HCT 38.3 (L) 39.0 - 52.0 %   MCV 93.4 80.0 - 100.0 fL   MCH 30.0 26.0 - 34.0 pg   MCHC 32.1 30.0 - 36.0 g/dL   RDW 13.3 11.5 - 15.5 %   Platelets 254 150 - 400 K/uL   nRBC 0.0 0.0 - 0.2 %   Neutrophils  Relative % 80 %   Neutro Abs 7.7 1.7 - 7.7 K/uL   Lymphocytes Relative 9 %   Lymphs Abs 0.9 0.7 - 4.0 K/uL   Monocytes Relative 9 %   Monocytes Absolute 0.9 0.1 - 1.0 K/uL   Eosinophils Relative 1 %   Eosinophils Absolute 0.1 0.0 - 0.5 K/uL   Basophils Relative 1 %   Basophils Absolute 0.1 0.0 - 0.1 K/uL   Immature Granulocytes 0 %   Abs Immature Granulocytes 0.02 0.00 - 0.07 K/uL  Urinalysis, Routine w reflex microscopic Urine, Catheterized     Status: Abnormal   Collection Time: 11/29/21  1:30 AM  Result Value Ref Range   Color, Urine YELLOW YELLOW   APPearance CLEAR CLEAR   Specific Gravity, Urine >1.046 (H) 1.005 - 1.030   pH 6.0 5.0 - 8.0   Glucose, UA NEGATIVE NEGATIVE mg/dL   Hgb urine dipstick LARGE (A) NEGATIVE   Bilirubin Urine NEGATIVE NEGATIVE   Ketones, ur NEGATIVE NEGATIVE mg/dL   Protein, ur 30 (A) NEGATIVE mg/dL   Nitrite NEGATIVE NEGATIVE   Leukocytes,Ua MODERATE (A) NEGATIVE   RBC / HPF >50 (H) 0 - 5 RBC/hpf   WBC, UA >50 (H) 0 - 5 WBC/hpf   Bacteria, UA RARE (A) NONE SEEN   Squamous Epithelial / LPF 0-5 0 - 5   WBC Clumps PRESENT    Mucus PRESENT    Ca Oxalate Crys, UA PRESENT     BMET Recent Labs    11/26/21 1233 11/28/21 2259  NA 137 137  K 4.4 4.1  CL 108 99  CO2 18* 24  GLUCOSE 112* 125*  BUN 25* 46*  CREATININE 1.70* 2.54*  CALCIUM 9.9 10.7*   PT/INR No results for input(s): "LABPROT", "INR" in the last 72 hours. ABG No results for input(s): "PHART", "HCO3" in the last 72 hours.  Invalid input(s): "PCO2", "PO2"  Studies/Results: CT Abdomen Pelvis W Contrast  Result Date: 11/29/2021 CLINICAL DATA:  Abdominal pain, constipation and suspected bowel obstruction. EXAM: CT ABDOMEN AND PELVIS WITH CONTRAST TECHNIQUE: Multidetector CT imaging of the abdomen and pelvis was performed using the standard protocol following bolus administration of  intravenous contrast. RADIATION DOSE REDUCTION: This exam was performed according to the departmental  dose-optimization program which includes automated exposure control, adjustment of the mA and/or kV according to patient size and/or use of iterative reconstruction technique. CONTRAST:  49m OMNIPAQUE IOHEXOL 300 MG/ML  SOLN COMPARISON:  CT without contrast 04/29/2020 FINDINGS: Lower chest: There are increasing atelectatic bands in both lung bases but no acute consolidative pneumonia. The cardiac size is normal. There calcifications in the aortic valve plane. No pericardial effusion. Hepatobiliary: There is mild hepatic steatosis without mass enhancement. Gallbladder is distended to 11.8 cm in length but no calcified stones or wall thickening are noted and no biliary dilatation. Pancreas: Moderately fatty replaced. No mass enhancement or inflammatory change. Spleen: Unremarkable. Adrenals/Urinary Tract: There is no adrenal mass. Again noted is a 12 cm uncomplicated thin walled homogeneous cyst in the lower pole of the right kidney measuring 12 cm and 1.6 Hounsfield units, and a left renal cyst laterally in the lower pole measuring 3 cm and 1.2 Hounsfield units. There is a 3 mm nonobstructive caliceal stone in the inferior pole left kidney. No other left renal stone is seen. There are scattered nonobstructing stones within the right kidney collecting system measuring from 2-4 mm. There is new demonstration of moderate right hydroureteronephrosis due to a 7 x 4 x 5 mm distal right ureteral stone approximately 6 cm from the UVJ. In the delayed phase no cortical contrast excretion is seen on the right but it is well seen on the left. The bladder is contracted around a Foley balloon and not well seen. Stomach/Bowel: The gastric wall is unremarkable. The duodenum and jejunum are decompressed but there are mildly dilated ileal segments in the mid to lower abdomen primarily to the left measuring up to 3.2 cm, without a visible transitional segment and with decompression of the right lower and low central abdominal small  bowel. The appendix is normal. There is abundant fecal material in the ascending and transverse colon, left colonic diverticula without evidence of diverticulitis or colitis. Vascular/Lymphatic: Aortic atherosclerosis. No enlarged abdominal or pelvic lymph nodes. Reproductive: Enlarged prostate, 6 cm transverse diameter bladder impression, unchanged in size. Other: Small umbilical fat hernia. Small left inguinal fat hernia. Multiple pelvic phleboliths. There is no free air, free hemorrhage, free fluid or acute inflammatory changes. Musculoskeletal: Osteopenia. Old L4-S1 posterior fusion construct with solid fusions. Chronic T12 and L1 compression fractures are again noted. No acute or other significant osseous findings. IMPRESSION: 1. 7 x 4 x 5 mm distal right ureteral stone 6 cm from the UVJ, with moderate obstructive uropathy. Correlate clinically for infectious complication. 2. Nonobstructive nephrolithiasis. 3. Multiple mildly dilated fluid-filled small bowel segments primarily the left mid to lower abdomen, decompression in the low central 2 right lower quadrant small bowel. 4. Intermediate grade small bowel obstruction suspected but etiology is indeterminate as the transitional segment could not be found. Possibilities include occult adhesions and occult internal hernia. There is no bowel pneumatosis or mesenteric edema. Consultation with a surgeon is recommended. 5. Abundant stool ascending and transverse colon. Diverticula without diverticulitis. 6. Renal cysts. 7. Enlarged prostate with bladder impression, bladder catheterization. 8. Osteopenia, degenerative and postsurgical changes of the spine. Right small umbilical and left inguinal fat hernias. 9. Distended but otherwise unremarkable gallbladder. Electronically Signed   By: KTelford NabM.D.   On: 11/29/2021 00:20     Assessment/Plan: Right distal ureteral stone with obstruction and AKI with possible UTI.   I will take him to  the OR emergently  today for cystoscopy with right ureteral stent insertion.  I have reviewed the risks of bleeding, infection, ureteral injury, need for secondary procedures and subsequent stone management, thrombotic events and anesthetic complications.   He will need f/u with Dr. Gloriann Loan for definitive stone management.   Chronic retention with long term foley.  Will replace foley after procedure.   Constipation.   Seen by Dr. Rosendo Gros.  Pt not felt to have SBO.         No follow-ups on file.    CC: Dr. Tennis Must.      Irine Seal 11/29/2021

## 2021-11-29 NOTE — H&P (Signed)
History and Physical    Patient: Luke Foster WNU:[id number] DOB: 1950/02/25 DOA: 11/28/2021 DOS: the patient was seen and examined on 11/29/2021 PCP: [name], MD  Patient coming from: Home  Chief Complaint:  Chief Complaint  Patient presents with   Abdominal Pain   Constipation   HPI: Luke Foster is a 71 y.o. male with medical history significant of osteoarthritis, chronic back pain, lumbosacral radiculopathy, spinal cord stimulator, history of nephrolithiasis, history of bilateral hydronephrosis, hyperlipidemia, neurogenic bladder, chronic constipation who presented to the [facility] emergency department due to abdominal pain and constipation.  He was seen in [facility], ED on Wednesday given an enema successfully and was able to go home.  However, he returned with more abdominal pain, distention, right flank pain and nausea.  CT scan was done which show a 7 mm right distal urolith with right-sided hydronephrosis.  His creatinine has increased from 1.71 mg/dL on Wednesday at [facility] to 2.54 mg/dL earlier today.  He was recently treated with ciprofloxacin for UTI, but has not been taking it as prescribed.  His urine looked darker yesterday.  No fever, chills or night sweats. No sore throat, rhinorrhea, dyspnea, wheezing or hemoptysis.  No chest pain, palpitations, diaphoresis, PND, orthopnea or pitting edema of the lower extremities.  No polyuria, polydipsia, polyphagia or blurred vision.  ED course: Initial vital signs were temperature 98.5 F, pulse 99, respirations 17, BP 143/83 mmHg O2 sat 99% on room air.  The patient received 1000 mL of normal saline bolus, 1000 mL of LR bolus, ondansetron 4 mg IVP, hydromorphone 1 mg IVP, fentanyl 50 mcg IVP and was started on cefepime.  Lab work: Urinalysis showed moderate leukocyte esterase with large hemoglobinuria, more than 50 RBC, more than 50 WBC with rare bacteria and WBC clumps on microscopic examination.  CBC showed a white count of 9.7,  hemoglobin 12.3 g/dL and platelets 254.  CMP showed a calcium of 10.7, glucose of 125, BUN 46 and creatinine 2.54 mg/dL.  Imaging: CT abdomen/pelvis without contrast positive for a right ureteral stone about 6 cm from the UVJ with moderate obstructive uropathy.  There was also nonobstructing nephrolithiasis.  There was questionable bowel obstruction, but after consulting with Dr. Rosendo Gros this was just felt to be constipation.   Review of Systems: As mentioned in the history of present illness. All other systems reviewed and are negative. Past Medical History:  Diagnosis Date   Arthritis    Chronic back pain    Chronic pain 1999   Bilateral feet (R >L)   Difficult or painful urination    High blood pressure    History of kidney stones    H/O   Nerve damage    Pain management    Paresthesia of foot, bilateral    Renal disorder    Sleep apnea    USES CPAP   Weakness of both legs    Past Surgical History:  Procedure Laterality Date   arm surgery     BACK SURGERY     X2   BOTOX INJECTION N/A 10/06/2021   Procedure: CYSTOSCOPY LITHOLAPAXY WITH BOTOX INJECTION;  Surgeon: Lucas Mallow, MD;  Location: [facility] ORS;  Service: Urology;  Laterality: N/A;   FACIAL COSMETIC SURGERY     HERNIA REPAIR     IR US GUIDE BX ASP/DRAIN  10/19/2017   KIDNEY STONE SURGERY     LUMBAR SPINAL CORD SIMULATOR LEAD REMOVAL N/A 03/01/2019   Procedure: LUMBAR SPINAL CORD SIMULATOR LEAD REMOVAL;  Surgeon: Meade Maw, MD;  Location: [facility] ORS;  Service: Neurosurgery;  Laterality: N/A;   MASTECTOMY Left    SPINAL CORD STIMULATOR REMOVAL N/A 03/01/2019   Procedure: LUMBAR SPINAL CORD STIMULATOR REMOVAL;  Surgeon: Meade Maw, MD;  Location: [facility] ORS;  Service: Neurosurgery;  Laterality: N/A;   WISDOM TOOTH EXTRACTION     Social History:  reports that he quit smoking about 30 years ago. His smoking use included cigarettes. He has a 20.00 pack-year smoking history. He quit smokeless tobacco use about  30 years ago. He reports current alcohol use. He reports that he does not use drugs.  Allergies  Allergen Reactions   Erythromycin Itching    Family History  Problem Relation Age of Onset   Heart disease Father        Living, 47   Breast cancer Mother        Living, 50   Hypercholesterolemia Mother    Healthy Brother    Healthy Sister     Prior to Admission medications   Medication Sig Start Date End Date Taking? Authorizing Provider  acetaminophen (TYLENOL) 500 MG tablet Take 1,000 mg by mouth every 6 (six) hours as needed for mild pain or moderate pain.   Yes [provider]  ascorbic acid (VITAMIN C) 500 MG tablet Take 500 mg by mouth daily.   Yes [provider]  cholecalciferol (VITAMIN D3) 25 MCG (1000 UNIT) tablet Take 1 tablet (1,000 Units total) by mouth daily. Patient taking differently: Take 2,000 Units by mouth daily. 05/17/20  Yes Medina-Vargas, Monina C, NP  ciprofloxacin (CIPRO) 500 MG tablet Take 1 tablet (500 mg total) by mouth 2 (two) times daily. One po bid x 7 days Patient taking differently: Take 500 mg by mouth 2 (two) times daily. 11/24/21  Yes Ali, Amjad, PA-C  diltiazem (CARDIZEM CD) 120 MG 24 hr capsule Take 1 capsule (120 mg total) by mouth every evening. 05/17/20  Yes Medina-Vargas, Monina C, NP  docusate sodium (COLACE) 100 MG capsule Take 1 capsule (100 mg total) by mouth in the morning, at noon, and at bedtime. Patient taking differently: Take 100-300 mg by mouth at bedtime. 05/17/20  Yes Medina-Vargas, Monina C, NP  enalapril (VASOTEC) 5 MG tablet Take 5 mg by mouth 2 (two) times daily. 07/17/21  Yes [provider]  mirabegron ER (MYRBETRIQ) 50 MG TB24 tablet Take 1 tablet (50 mg total) by mouth daily. 05/17/20  Yes Medina-Vargas, Monina C, NP  oxyCODONE (ROXICODONE) 15 MG immediate release tablet Take 1 tablet (15 mg total) by mouth every 6 (six) hours as needed for pain. 05/17/20  Yes Medina-Vargas, Monina C, NP  polyethylene glycol  (MIRALAX / GLYCOLAX) 17 g packet Take 17 g by mouth 2 (two) times daily. Patient taking differently: Take 17 g by mouth daily as needed for mild constipation or moderate constipation. 05/17/20  Yes Medina-Vargas, Monina C, NP  rOPINIRole (REQUIP) 0.5 MG tablet Take 0.5 mg by mouth at bedtime as needed (restless legs). 09/22/21  Yes [provider]  senna-docusate (SENOKOT-S) 8.6-50 MG tablet Take 1 tablet by mouth 2 (two) times daily. Patient taking differently: Take 2-3 tablets by mouth at bedtime. 05/02/20  Yes Georgette Shell, MD  solifenacin (VESICARE) 5 MG tablet Take 5 mg by mouth daily. 07/17/21  Yes [provider]  sulfamethoxazole-trimethoprim (BACTRIM DS) 800-160 MG tablet Take 1 tablet by mouth every evening.   Yes [provider]  tamsulosin (FLOMAX) 0.4 MG CAPS capsule Take 1 capsule (  0.4 mg total) by mouth every evening. 05/17/20  Yes Medina-Vargas, Monina C, NP  venlafaxine XR (EFFEXOR-XR) 75 MG 24 hr capsule Take 1 capsule (75 mg total) by mouth in the morning and at bedtime. 05/17/20  Yes Medina-Vargas, Monina C, NP  vitamin B-12 (CYANOCOBALAMIN) 500 MCG tablet Take 1 tablet (500 mcg total) by mouth daily. Patient taking differently: Take 1,000 mcg by mouth 2 (two) times daily. 05/17/20  Yes Medina-Vargas, Monina C, NP    Physical Exam: Vitals:   11/29/21 0515 11/29/21 0518 11/29/21 0530 11/29/21 0600  BP: (!) 110/93  108/78 129/79  Pulse: (!) 120 (!) 114 (!) 118   Resp: 18 18 (!) 21 (!) 21  Temp:      TempSrc:      SpO2: 97% 98% 98%   Weight:      Height:       Physical Exam Vitals and nursing note reviewed.  Constitutional:      General: He is awake. He is not in acute distress.    Appearance: He is well-developed. He is ill-appearing.     Comments: Chronically ill-appearing.  HENT:     Head: Normocephalic.     Nose: No rhinorrhea.     Mouth/Throat:     Mouth: Mucous membranes are dry.  Eyes:     General: No scleral icterus.    Pupils:  Pupils are equal, round, and reactive to light.  Neck:     Vascular: No JVD.  Cardiovascular:     Rate and Rhythm: Normal rate and regular rhythm.     Heart sounds: S1 normal and S2 normal.  Pulmonary:     Effort: Pulmonary effort is normal.     Breath sounds: Normal breath sounds.  Abdominal:     General: Abdomen is protuberant. Bowel sounds are normal. There is distension.     Palpations: Abdomen is soft.     Tenderness: There is no abdominal tenderness. There is right CVA tenderness.  Musculoskeletal:     Cervical back: Neck supple.     Right lower leg: No edema.     Left lower leg: No edema.  Skin:    General: Skin is warm and dry.  Neurological:     General: No focal deficit present.     Mental Status: He is alert and oriented to person, place, and time.  Psychiatric:        Mood and Affect: Mood normal.        Behavior: Behavior is cooperative.   Data Reviewed:  There are no new results to review at this time.  Assessment and Plan: Principal Problem:   AKI (acute kidney injury) ([place])  In the setting of:   Obstructive uropathy Secondary to:   Nephrolithiasis Status post cystoscopy/urinary stent. Continue postop care per Dr. Jeffie Pollock. Inpatient/telemetry. Continue IV fluids. Hold ARB/ACE. Avoid hypotension. Avoid nephrotoxins. Monitor intake and output. Monitor renal function electrolytes. Continue cefepime 2 g every 12 hours. Follow-up urine culture and sensitivity.  Active Problems:   Lumbosacral radiculopathy Analgesics as needed.    Atherosclerosis of abdominal aorta (HCC)   Hyperlipidemia, mixed Currently not on statin. Follow-up with primary care provider.    Benign essential HTN Continue diltiazem 120 mg p.o. daily. Monitor blood pressure and heart rate. Enalapril has been held.    Constipation Continue MiraLAX and stool softeners. Consider switching diltiazem to beta-blocker.    Advance Care Planning:   Code Status: Full Code   Consults:  Urology (Dr. Irine Seal).  Family  Communication:   Severity of Illness: The appropriate patient status for this patient is INPATIENT. Inpatient status is judged to be reasonable and necessary in order to provide the required intensity of service to ensure the patient's safety. The patient's presenting symptoms, physical exam findings, and initial radiographic and laboratory data in the context of their chronic comorbidities is felt to place them at high risk for further clinical deterioration. Furthermore, it is not anticipated that the patient will be medically stable for discharge from the hospital within 2 midnights of admission.   * I certify that at the point of admission it is my clinical judgment that the patient will require inpatient hospital care spanning beyond 2 midnights from the point of admission due to high intensity of service, high risk for further deterioration and high frequency of surveillance required.*  Author: Reubin Milan, MD 11/29/2021 7:38 AM  For on call review [email or web address].   This document was prepared using Dragon voice recognition software and may contain some unintended transcription errors.

## 2021-11-30 ENCOUNTER — Encounter (HOSPITAL_COMMUNITY): Payer: Self-pay | Admitting: Urology

## 2021-11-30 ENCOUNTER — Inpatient Hospital Stay (HOSPITAL_COMMUNITY): Payer: Medicare Other

## 2021-11-30 DIAGNOSIS — N179 Acute kidney failure, unspecified: Secondary | ICD-10-CM

## 2021-11-30 DIAGNOSIS — K567 Ileus, unspecified: Secondary | ICD-10-CM | POA: Diagnosis present

## 2021-11-30 DIAGNOSIS — R9431 Abnormal electrocardiogram [ECG] [EKG]: Secondary | ICD-10-CM

## 2021-11-30 DIAGNOSIS — I700 Atherosclerosis of aorta: Secondary | ICD-10-CM

## 2021-11-30 DIAGNOSIS — I10 Essential (primary) hypertension: Secondary | ICD-10-CM

## 2021-11-30 DIAGNOSIS — N139 Obstructive and reflux uropathy, unspecified: Secondary | ICD-10-CM | POA: Diagnosis not present

## 2021-11-30 DIAGNOSIS — R112 Nausea with vomiting, unspecified: Secondary | ICD-10-CM | POA: Diagnosis present

## 2021-11-30 LAB — CBC
HCT: 33.5 % — ABNORMAL LOW (ref 39.0–52.0)
Hemoglobin: 10.7 g/dL — ABNORMAL LOW (ref 13.0–17.0)
MCH: 29.8 pg (ref 26.0–34.0)
MCHC: 31.9 g/dL (ref 30.0–36.0)
MCV: 93.3 fL (ref 80.0–100.0)
Platelets: 209 10*3/uL (ref 150–400)
RBC: 3.59 MIL/uL — ABNORMAL LOW (ref 4.22–5.81)
RDW: 13.2 % (ref 11.5–15.5)
WBC: 10 10*3/uL (ref 4.0–10.5)
nRBC: 0 % (ref 0.0–0.2)

## 2021-11-30 LAB — SEDIMENTATION RATE: Sed Rate: 60 mm/hr — ABNORMAL HIGH (ref 0–16)

## 2021-11-30 LAB — COMPREHENSIVE METABOLIC PANEL
ALT: 21 U/L (ref 0–44)
AST: 27 U/L (ref 15–41)
Albumin: 3.1 g/dL — ABNORMAL LOW (ref 3.5–5.0)
Alkaline Phosphatase: 52 U/L (ref 38–126)
Anion gap: 9 (ref 5–15)
BUN: 37 mg/dL — ABNORMAL HIGH (ref 8–23)
CO2: 24 mmol/L (ref 22–32)
Calcium: 9.5 mg/dL (ref 8.9–10.3)
Chloride: 104 mmol/L (ref 98–111)
Creatinine, Ser: 1.87 mg/dL — ABNORMAL HIGH (ref 0.61–1.24)
GFR, Estimated: 38 mL/min — ABNORMAL LOW (ref >60)
Glucose, Bld: 117 mg/dL — ABNORMAL HIGH (ref 70–99)
Potassium: 4.2 mmol/L (ref 3.5–5.1)
Sodium: 137 mmol/L (ref 135–145)
Total Bilirubin: 0.6 mg/dL (ref 0.3–1.2)
Total Protein: 6.6 g/dL (ref 6.5–8.1)

## 2021-11-30 LAB — TROPONIN I (HIGH SENSITIVITY)
Troponin I (High Sensitivity): 15 ng/L (ref <18)
Troponin I (High Sensitivity): 12 ng/L (ref <18)

## 2021-11-30 LAB — C-REACTIVE PROTEIN: CRP: 9.6 mg/dL — ABNORMAL HIGH (ref <1.0)

## 2021-11-30 LAB — HIV ANTIBODY (ROUTINE TESTING W REFLEX): HIV Screen 4th Generation wRfx: NONREACTIVE

## 2021-11-30 MED ORDER — BISACODYL 10 MG RE SUPP
10.0000 mg | Freq: Once | RECTAL | Status: AC
  Administered 2021-11-30: 10 mg via RECTAL
  Filled 2021-11-30: qty 1

## 2021-11-30 MED ORDER — METOCLOPRAMIDE HCL 5 MG/ML IJ SOLN
10.0000 mg | Freq: Four times a day (QID) | INTRAMUSCULAR | Status: DC
  Administered 2021-11-30 – 2021-12-01 (×4): 10 mg via INTRAVENOUS
  Filled 2021-11-30 (×4): qty 2

## 2021-11-30 MED ORDER — LINACLOTIDE 145 MCG PO CAPS
145.0000 ug | ORAL_CAPSULE | Freq: Every day | ORAL | Status: DC
  Administered 2021-12-01: 145 ug via ORAL
  Filled 2021-11-30 (×2): qty 1

## 2021-11-30 MED ORDER — DOCUSATE SODIUM 100 MG PO CAPS
100.0000 mg | ORAL_CAPSULE | Freq: Two times a day (BID) | ORAL | Status: DC
  Administered 2021-12-01: 100 mg via ORAL
  Filled 2021-11-30: qty 1

## 2021-11-30 MED ORDER — TAMSULOSIN HCL 0.4 MG PO CAPS: 0.4000 mg | ORAL_CAPSULE | Freq: Every day | ORAL | Status: DC

## 2021-11-30 NOTE — Progress Notes (Signed)
1 Day Post-Op  Subjective:  Luke Foster is POD#1 from cystoscopy with right stenting for distal stones with obstruction and AKI.  He has no flank pain or fever and his Cr has declined.   The foley is draining pink urine.  He has some persistent nausea and reports that an abdominal film has been ordered.   ROS:  Review of Systems  Gastrointestinal:  Positive for nausea.    Anti-infectives: Anti-infectives (From admission, onward)    Start     Dose/Rate Route Frequency Ordered Stop   11/29/21 1200  ceFEPIme (MAXIPIME) 2 g in sodium chloride 0.9 % 100 mL IVPB        2 g 200 mL/hr over 30 Minutes Intravenous Every 12 hours 11/29/21 0319     11/29/21 0130  ceFEPIme (MAXIPIME) 2 g in sodium chloride 0.9 % 100 mL IVPB        2 g 200 mL/hr over 30 Minutes Intravenous  Once 11/29/21 0116 11/29/21 0328       Current Facility-Administered Medications  Medication Dose Route Frequency Provider Last Rate Last Admin   acetaminophen (TYLENOL) tablet 650 mg  650 mg Oral Q6H PRN Reubin Milan, MD   650 mg at 11/29/21 2134   Or   acetaminophen (TYLENOL) suppository 650 mg  650 mg Rectal Q6H PRN Reubin Milan, MD       ceFEPIme (MAXIPIME) 2 g in sodium chloride 0.9 % 100 mL IVPB  2 g Intravenous Q12H Reubin Milan, MD 200 mL/hr at 11/30/21 0816 2 g at 11/30/21 0816   Chlorhexidine Gluconate Cloth 2 % PADS 6 each  6 each Topical Q0600 Reubin Milan, MD       diltiazem (CARDIZEM CD) 24 hr capsule 120 mg  120 mg Oral QPM Reubin Milan, MD   120 mg at 11/29/21 1803   docusate sodium (COLACE) capsule 100-300 mg  100-300 mg Oral QHS Reubin Milan, MD   100 mg at 11/29/21 2134   HYDROmorphone (DILAUDID) injection 0.5 mg  0.5 mg Intravenous Q3H PRN Reubin Milan, MD   0.5 mg at 11/30/21 2993   influenza vaccine adjuvanted (FLUAD) injection 0.5 mL  0.5 mL Intramuscular Tomorrow-1000 Reubin Milan, MD       lactated ringers infusion   Intravenous Continuous Reubin Milan, MD 125 mL/hr at 11/30/21 0734 New Bag at 11/30/21 0734   mirabegron ER (MYRBETRIQ) tablet 50 mg  50 mg Oral Daily Reubin Milan, MD   50 mg at 11/30/21 0809   ondansetron (ZOFRAN) tablet 4 mg  4 mg Oral Q6H PRN Reubin Milan, MD       Or   ondansetron [facility]) injection 4 mg  4 mg Intravenous Q6H PRN Reubin Milan, MD   4 mg at 11/30/21 7169   oxyCODONE (Oxy IR/ROXICODONE) immediate release tablet 15 mg  15 mg Oral Q6H PRN Reubin Milan, MD   15 mg at 11/29/21 2134   pneumococcal 20-valent conjugate vaccine (PREVNAR 20) injection 0.5 mL  0.5 mL Intramuscular Tomorrow-1000 Reubin Milan, MD       polyethylene glycol [facility] / Floria Raveling) packet 17 g  17 g Oral Daily Reubin Milan, MD   17 g at 11/30/21 0809   venlafaxine XR (EFFEXOR-XR) 24 hr capsule 75 mg  75 mg Oral Q breakfast Reubin Milan, MD   75 mg at 11/30/21 0809   vitamin B-12 (CYANOCOBALAMIN) tablet 500 mcg  500 mcg Oral Daily  Reubin Milan, MD   500 mcg at 11/30/21 0809     Objective: Vital signs in last 24 hours: Temp:  [98.3 F (36.8 C)-99.3 F (37.4 C)] 98.4 F (36.9 C) (10/22 0435) Pulse Rate:  [80-99] 80 (10/22 0435) Resp:  [16-21] 17 (10/22 0221) BP: (129-149)/(57-94) 149/84 (10/22 0435) SpO2:  [84 %-100 %] 97 % (10/22 0435)  Intake/Output from previous day: 10/21 0701 - 10/22 0700 In: 4464.2 [P.O.:480; I.V.:2684.2; IV Piggyback:1300] Out: 2025 [Urine:1925; Emesis/NG output:100] Intake/Output this shift: No intake/output data recorded.   Physical Exam Vitals reviewed.  Constitutional:      Appearance: He is well-developed.  Abdominal:     Palpations: Abdomen is soft.     Tenderness: There is no abdominal tenderness.  Neurological:     Mental Status: He is alert.     Lab Results:  Recent Labs    11/29/21 1056 11/30/21 0422  WBC 10.8* 10.0  HGB 11.1* 10.7*  HCT 34.9* 33.5*  PLT 217 209   BMET Recent Labs    11/29/21 1056 11/30/21 0422   NA 137 137  K 4.6 4.2  CL 105 104  CO2 25 24  GLUCOSE 116* 117*  BUN 43* 37*  CREATININE 2.44* 1.87*  CALCIUM 9.6 9.5   PT/INR No results for input(s): "LABPROT", "INR" in the last 72 hours. ABG No results for input(s): "PHART", "HCO3" in the last 72 hours.  Invalid input(s): "PCO2", "PO2"  Studies/Results: DG Abd Portable 1V  Result Date: 11/30/2021 CLINICAL DATA:  71 year old male with recent abdominal pain. Right side obstructive uropathy. Small-bowel obstruction or ileus. EXAM: PORTABLE ABDOMEN - 1 VIEW COMPARISON:  CT Abdomen and Pelvis 11/28/2021 and earlier. FINDINGS: Portable AP supine view at 0905 hours. New right double-J ureteral stent. Gas-filled stomach and bowel loops throughout the abdomen and pelvis. Central small bowel loops up to 4 cm diameter appear increased from the recent CT. But bowel gas is present to the rectum. Previous lumbar fusion. No acute osseous abnormality identified. Stable lung bases. No pneumoperitoneum identified on these supine views. IMPRESSION: 1. New right double-J ureteral stent. 2. Increased gaseous distension of the stomach and bowel diffusely throughout the abdomen since 11/28/2021. But the pattern favors ileus over mechanical obstruction at this time. Electronically Signed   By: Genevie Ann M.D.   On: 11/30/2021 09:43   DG C-Arm 1-60 Min-No Report  Result Date: 11/29/2021 Fluoroscopy was utilized by the requesting physician.  No radiographic interpretation.   CT Abdomen Pelvis W Contrast  Result Date: 11/29/2021 CLINICAL DATA:  Abdominal pain, constipation and suspected bowel obstruction. EXAM: CT ABDOMEN AND PELVIS WITH CONTRAST TECHNIQUE: Multidetector CT imaging of the abdomen and pelvis was performed using the standard protocol following bolus administration of intravenous contrast. RADIATION DOSE REDUCTION: This exam was performed according to the departmental dose-optimization program which includes automated exposure control, adjustment  of the mA and/or kV according to patient size and/or use of iterative reconstruction technique. CONTRAST:  51m OMNIPAQUE IOHEXOL 300 MG/ML  SOLN COMPARISON:  CT without contrast 04/29/2020 FINDINGS: Lower chest: There are increasing atelectatic bands in both lung bases but no acute consolidative pneumonia. The cardiac size is normal. There calcifications in the aortic valve plane. No pericardial effusion. Hepatobiliary: There is mild hepatic steatosis without mass enhancement. Gallbladder is distended to 11.8 cm in length but no calcified stones or wall thickening are noted and no biliary dilatation. Pancreas: Moderately fatty replaced. No mass enhancement or inflammatory change. Spleen: Unremarkable. Adrenals/Urinary Tract: There is  no adrenal mass. Again noted is a 12 cm uncomplicated thin walled homogeneous cyst in the lower pole of the right kidney measuring 12 cm and 1.6 Hounsfield units, and a left renal cyst laterally in the lower pole measuring 3 cm and 1.2 Hounsfield units. There is a 3 mm nonobstructive caliceal stone in the inferior pole left kidney. No other left renal stone is seen. There are scattered nonobstructing stones within the right kidney collecting system measuring from 2-4 mm. There is new demonstration of moderate right hydroureteronephrosis due to a 7 x 4 x 5 mm distal right ureteral stone approximately 6 cm from the UVJ. In the delayed phase no cortical contrast excretion is seen on the right but it is well seen on the left. The bladder is contracted around a Foley balloon and not well seen. Stomach/Bowel: The gastric wall is unremarkable. The duodenum and jejunum are decompressed but there are mildly dilated ileal segments in the mid to lower abdomen primarily to the left measuring up to 3.2 cm, without a visible transitional segment and with decompression of the right lower and low central abdominal small bowel. The appendix is normal. There is abundant fecal material in the ascending  and transverse colon, left colonic diverticula without evidence of diverticulitis or colitis. Vascular/Lymphatic: Aortic atherosclerosis. No enlarged abdominal or pelvic lymph nodes. Reproductive: Enlarged prostate, 6 cm transverse diameter bladder impression, unchanged in size. Other: Small umbilical fat hernia. Small left inguinal fat hernia. Multiple pelvic phleboliths. There is no free air, free hemorrhage, free fluid or acute inflammatory changes. Musculoskeletal: Osteopenia. Old L4-S1 posterior fusion construct with solid fusions. Chronic T12 and L1 compression fractures are again noted. No acute or other significant osseous findings. IMPRESSION: 1. 7 x 4 x 5 mm distal right ureteral stone 6 cm from the UVJ, with moderate obstructive uropathy. Correlate clinically for infectious complication. 2. Nonobstructive nephrolithiasis. 3. Multiple mildly dilated fluid-filled small bowel segments primarily the left mid to lower abdomen, decompression in the low central 2 right lower quadrant small bowel. 4. Intermediate grade small bowel obstruction suspected but etiology is indeterminate as the transitional segment could not be found. Possibilities include occult adhesions and occult internal hernia. There is no bowel pneumatosis or mesenteric edema. Consultation with a surgeon is recommended. 5. Abundant stool ascending and transverse colon. Diverticula without diverticulitis. 6. Renal cysts. 7. Enlarged prostate with bladder impression, bladder catheterization. 8. Osteopenia, degenerative and postsurgical changes of the spine. Right small umbilical and left inguinal fat hernias. 9. Distended but otherwise unremarkable gallbladder. Electronically Signed   By: Telford Nab M.D.   On: 11/29/2021 00:20     Assessment and Plan: Right distal ureteral stones with obstruction with probably UTI and AKI.  He is doing well s/p stenting and will need ureteroscopy for his stones in a couple of weeks.    Chronic retention.   He is tolerating the foley well.   He remains on Myrbetriq.       LOS: 1 day    Irine Seal 11/30/2021 Patient ID: Luke Foster, male   DOB: 12/15/1950, 70 y.o.   MRN: [id number]

## 2021-11-30 NOTE — Progress Notes (Signed)
[facility]                                                                              Luke Foster, is a 71 y.o. male, DOB - 1950/03/09, SWF:[id number] Admit date - 11/28/2021    Outpatient Primary MD for the patient is [name], MD  LOS - 1  days  Chief Complaint  Patient presents with   Abdominal Pain   Constipation       Brief summary   Patient is a 71 year old male with osteoarthritis, chronic back pain, lumbosacral radiculopathy with spinal cord stimulator, history of nephrolithiasis, history of bilateral hydronephrosis, hyperlipidemia, neurogenic bladder with indwelling Foley, chronic constipation presented to ED with abdominal pain and constipation.  Patient was seen in [facility], ED on Wednesday, given an enema successfully and was able to go home.  However he returned with more abdominal pain, distention, right flank pain, nausea.  CT showed 7 mm right distal ureteral stone 6 cm from UVJ with moderate obstructive uropathy.  Multiple mildly dilated fluid-filled small bowel segments primarily the left mid to lower abdomen, intermediate grade small bowel obstruction possibly include occult lesions and occult internal hernia.  Assessment & Plan    Principal Problem: Abdominal pain with distal ureteral stone, obstructive uropathy in the setting of neurogenic bladder with indwelling Foley -Urology following, underwent cystoscopy, right retrograde pyelogram, ureteroscopy and insertion of right double-J stent on 10/21, postop day #1 -Continue IV cefepime -Recent urine culture on 10/13 had shown Enterobacter, UA on admission positive for UTI   Active Problems: Intractable nausea and vomiting, ileus versus SBO - In the setting of chronic constipation -CT abdomen had shown indeterminate grade small bowel obstruction  -On my examination this a.m., having persistent nausea, dry heaving, abdominal distention -Stat KUB showed increased gaseous distention of  stomach and bowel diffusely throughout the abdomen, favors ileus over mechanical obstruction -Placed on clear liquid diet, IV fluids, IV Reglan scheduled -Will place on bowel regimen, NGT if vomiting, surgery consulted, discussed with Dr. Marlou Starks     Abnormal ECG -Persistent nausea, troponin 19->21, EKG overnight with ?  ST elevation/pericarditis -No complaints of chest pain on my exam, repeat EKG, ESR, CRP, troponin -Cardiology consulted, d/w Dr Tomasa Blase, [facility] reviewed, patient has persistent ST elevations in the previous EKGs, not new.  No further cardiac work-up recommended.    AKI (acute kidney injury) ([place]) -Likely due to #1, creatinine 1.7 on 11/26/2021 presented with creatinine of 2.54 -Continue IV fluid hydration, status post stent placement, -Creatinine improving to 1.8 today  Chronic back pain with lumbosacral radiculopathy -Use narcotics judiciously given ileus/SBO -On outpatient oxycodone 15 mg every 6 hours as needed -Placed on bowel regimen, Colace, MiraLAX, linzess     Benign essential HTN -Continue diltiazem  Anxiety/depression -Continue venlafaxine    Code Status: Full CODE STATUS DVT Prophylaxis:  SCDs Start: 11/29/21 0737   Level of Care: Level of care: Telemetry Family Communication: Updated patient   Disposition Plan:      Remains inpatient appropriate:     Procedures:  Procedure: 1.  Cystoscopy with right retrograde pyelogram and interpretation. 2.  Right ureteroscopy with  insertion of right double-J stent.  3 application of fluoroscopy.  Consultants:   Urology General surgery Cardiology  Antimicrobials:   Anti-infectives (From admission, onward)    Start     Dose/Rate Route Frequency Ordered Stop   11/29/21 1200  ceFEPIme (MAXIPIME) 2 g in sodium chloride 0.9 % 100 mL IVPB        2 g 200 mL/hr over 30 Minutes Intravenous Every 12 hours 11/29/21 0319     11/29/21 0130  ceFEPIme (MAXIPIME) 2 g in sodium chloride 0.9 % 100 mL IVPB        2  g 200 mL/hr over 30 Minutes Intravenous  Once 11/29/21 0116 11/29/21 0328          Medications  Chlorhexidine Gluconate Cloth  6 each Topical Q0600   diltiazem  120 mg Oral QPM   docusate sodium  100-300 mg Oral QHS   influenza vaccine adjuvanted  0.5 mL Intramuscular Tomorrow-1000   metoCLOPramide (REGLAN) injection  10 mg Intravenous Q6H   mirabegron ER  50 mg Oral Daily   pneumococcal 20-valent conjugate vaccine  0.5 mL Intramuscular Tomorrow-1000   polyethylene glycol  17 g Oral Daily   venlafaxine XR  75 mg Oral Q breakfast   cyanocobalamin  500 mcg Oral Daily      Subjective:   Neiko Trivedi was seen and examined today.  Looking miserable and uncomfortable, persistent nausea and dry heaving.  No acute abdominal pain.  No fevers.  No acute chest pain or shortness of breath.  Objective:   Vitals:   11/29/21 2002 11/29/21 2236 11/30/21 0221 11/30/21 0435  BP: 129/75 130/73 135/76 (!) 149/84  Pulse: 95 88 93 80  Resp: $Remo'17 16 17   'toeSO$ Temp: 99.1 F (37.3 C) 99.3 F (37.4 C) 99.2 F (37.3 C) 98.4 F (36.9 C)  TempSrc: Oral Oral Oral Oral  SpO2: 97% 94% 97% 97%  Weight:      Height:        Intake/Output Summary (Last 24 hours) at 11/30/2021 1039 Last data filed at 11/30/2021 0700 Gross per 24 hour  Intake 4464.23 ml  Output 2025 ml  Net 2439.23 ml     Wt Readings from Last 3 Encounters:  11/28/21 90.7 kg  11/26/21 91.9 kg  11/21/21 92 kg     Exam General: Alert and oriented x 3, uncomfortable and looking miserable Cardiovascular: S1 S2 auscultated,  RRR Respiratory: CTA B Gastrointestinal: Soft, NT, + distended, hypoactive BS Ext: no pedal edema bilaterally Neuro: no new FND's Psych: Normal affect     Data Reviewed:  I have personally reviewed following labs    CBC Lab Results  Component Value Date   WBC 10.0 11/30/2021   RBC 3.59 (L) 11/30/2021   HGB 10.7 (L) 11/30/2021   HCT 33.5 (L) 11/30/2021   MCV 93.3 11/30/2021   MCH 29.8 11/30/2021    PLT 209 11/30/2021   MCHC 31.9 11/30/2021   RDW 13.2 11/30/2021   LYMPHSABS 0.9 11/28/2021   MONOABS 0.9 11/28/2021   EOSABS 0.1 11/28/2021   BASOSABS 0.1 67/54/4920     Last metabolic panel Lab Results  Component Value Date   NA 137 11/30/2021   K 4.2 11/30/2021   CL 104 11/30/2021   CO2 24 11/30/2021   BUN 37 (H) 11/30/2021   CREATININE 1.87 (H) 11/30/2021   GLUCOSE 117 (H) 11/30/2021   GFRNONAA 38 (L) 11/30/2021   GFRAA >60 04/26/2019   CALCIUM 9.5 11/30/2021   PHOS 2.5 04/30/2020  PROT 6.6 11/30/2021   ALBUMIN 3.1 (L) 11/30/2021   BILITOT 0.6 11/30/2021   ALKPHOS 52 11/30/2021   AST 27 11/30/2021   ALT 21 11/30/2021   ANIONGAP 9 11/30/2021    CBG (last 3)  No results for input(s): "GLUCAP" in the last 72 hours.    Coagulation Profile: No results for input(s): "INR", "PROTIME" in the last 168 hours.   Radiology Studies: I have personally reviewed the imaging studies  DG Abd Portable 1V  Result Date: 11/30/2021 CLINICAL DATA:  71 year old male with recent abdominal pain. Right side obstructive uropathy. Small-bowel obstruction or ileus. EXAM: PORTABLE ABDOMEN - 1 VIEW COMPARISON:  CT Abdomen and Pelvis 11/28/2021 and earlier. FINDINGS: Portable AP supine view at 0905 hours. New right double-J ureteral stent. Gas-filled stomach and bowel loops throughout the abdomen and pelvis. Central small bowel loops up to 4 cm diameter appear increased from the recent CT. But bowel gas is present to the rectum. Previous lumbar fusion. No acute osseous abnormality identified. Stable lung bases. No pneumoperitoneum identified on these supine views. IMPRESSION: 1. New right double-J ureteral stent. 2. Increased gaseous distension of the stomach and bowel diffusely throughout the abdomen since 11/28/2021. But the pattern favors ileus over mechanical obstruction at this time. Electronically Signed   By: Genevie Ann M.D.   On: 11/30/2021 09:43   DG C-Arm 1-60 Min-No Report  Result  Date: 11/29/2021 Fluoroscopy was utilized by the requesting physician.  No radiographic interpretation.   CT Abdomen Pelvis W Contrast  Result Date: 11/29/2021 CLINICAL DATA:  Abdominal pain, constipation and suspected bowel obstruction. EXAM: CT ABDOMEN AND PELVIS WITH CONTRAST TECHNIQUE: Multidetector CT imaging of the abdomen and pelvis was performed using the standard protocol following bolus administration of intravenous contrast. RADIATION DOSE REDUCTION: This exam was performed according to the departmental dose-optimization program which includes automated exposure control, adjustment of the mA and/or kV according to patient size and/or use of iterative reconstruction technique. CONTRAST:  59mL OMNIPAQUE IOHEXOL 300 MG/ML  SOLN COMPARISON:  CT without contrast 04/29/2020 FINDINGS: Lower chest: There are increasing atelectatic bands in both lung bases but no acute consolidative pneumonia. The cardiac size is normal. There calcifications in the aortic valve plane. No pericardial effusion. Hepatobiliary: There is mild hepatic steatosis without mass enhancement. Gallbladder is distended to 11.8 cm in length but no calcified stones or wall thickening are noted and no biliary dilatation. Pancreas: Moderately fatty replaced. No mass enhancement or inflammatory change. Spleen: Unremarkable. Adrenals/Urinary Tract: There is no adrenal mass. Again noted is a 12 cm uncomplicated thin walled homogeneous cyst in the lower pole of the right kidney measuring 12 cm and 1.6 Hounsfield units, and a left renal cyst laterally in the lower pole measuring 3 cm and 1.2 Hounsfield units. There is a 3 mm nonobstructive caliceal stone in the inferior pole left kidney. No other left renal stone is seen. There are scattered nonobstructing stones within the right kidney collecting system measuring from 2-4 mm. There is new demonstration of moderate right hydroureteronephrosis due to a 7 x 4 x 5 mm distal right ureteral stone  approximately 6 cm from the UVJ. In the delayed phase no cortical contrast excretion is seen on the right but it is well seen on the left. The bladder is contracted around a Foley balloon and not well seen. Stomach/Bowel: The gastric wall is unremarkable. The duodenum and jejunum are decompressed but there are mildly dilated ileal segments in the mid to lower abdomen primarily to the  left measuring up to 3.2 cm, without a visible transitional segment and with decompression of the right lower and low central abdominal small bowel. The appendix is normal. There is abundant fecal material in the ascending and transverse colon, left colonic diverticula without evidence of diverticulitis or colitis. Vascular/Lymphatic: Aortic atherosclerosis. No enlarged abdominal or pelvic lymph nodes. Reproductive: Enlarged prostate, 6 cm transverse diameter bladder impression, unchanged in size. Other: Small umbilical fat hernia. Small left inguinal fat hernia. Multiple pelvic phleboliths. There is no free air, free hemorrhage, free fluid or acute inflammatory changes. Musculoskeletal: Osteopenia. Old L4-S1 posterior fusion construct with solid fusions. Chronic T12 and L1 compression fractures are again noted. No acute or other significant osseous findings. IMPRESSION: 1. 7 x 4 x 5 mm distal right ureteral stone 6 cm from the UVJ, with moderate obstructive uropathy. Correlate clinically for infectious complication. 2. Nonobstructive nephrolithiasis. 3. Multiple mildly dilated fluid-filled small bowel segments primarily the left mid to lower abdomen, decompression in the low central 2 right lower quadrant small bowel. 4. Intermediate grade small bowel obstruction suspected but etiology is indeterminate as the transitional segment could not be found. Possibilities include occult adhesions and occult internal hernia. There is no bowel pneumatosis or mesenteric edema. Consultation with a surgeon is recommended. 5. Abundant stool ascending  and transverse colon. Diverticula without diverticulitis. 6. Renal cysts. 7. Enlarged prostate with bladder impression, bladder catheterization. 8. Osteopenia, degenerative and postsurgical changes of the spine. Right small umbilical and left inguinal fat hernias. 9. Distended but otherwise unremarkable gallbladder. Electronically Signed   By: Telford Nab M.D.   On: 11/29/2021 00:20       Ashlyne Olenick M.D. [facility] 11/30/2021, 10:39 AM  Available via Epic secure chat 7am-7pm After 7 pm, please refer to night coverage provider listed on amion.

## 2021-11-30 NOTE — Consult Note (Signed)
Reason for Consult: Abdominal distention Referring Physician: [name] is an 71 y.o. male.  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  He has lots of problems emptying his bladder as well.  He has known kidney stones.  He reports that he intermittently over the last few years will have periods where he stops passing flatus and gets a little distended.  This seems to always resolve on its own.  He denies any previous abdominal surgery.  He has some nausea but no vomiting of any significant amount of volume.  He does seem to spit at times  Past Medical History:  Diagnosis Date   Arthritis    Chronic back pain    Chronic pain 1999   Bilateral feet (R >L)   Difficult or painful urination    High blood pressure    History of kidney stones    H/O   Nerve damage    Pain management    Paresthesia of foot, bilateral    Renal disorder    Sleep apnea    USES CPAP   Weakness of both legs     Past Surgical History:  Procedure Laterality Date   arm surgery     BACK SURGERY     X2   BOTOX INJECTION N/A 10/06/2021   Procedure: CYSTOSCOPY LITHOLAPAXY WITH BOTOX INJECTION;  Surgeon: Luke Mallow, MD;  Location: [facility];  Service: Urology;  Laterality: N/A;   CYSTOSCOPY W/ URETERAL STENT PLACEMENT Right 11/29/2021   Procedure: CYSTOSCOPY WITH RETROGRADE PYELOGRAM/URETERAL STENT PLACEMENT;  Surgeon: Luke Seal, MD;  Location: [facility];  Service: Urology;  Laterality: Right;   FACIAL COSMETIC SURGERY     HERNIA REPAIR     IR US GUIDE BX ASP/DRAIN  10/19/2017   KIDNEY STONE SURGERY     LUMBAR SPINAL CORD SIMULATOR LEAD REMOVAL N/A 03/01/2019   Procedure: LUMBAR SPINAL CORD SIMULATOR LEAD REMOVAL;  Surgeon: Luke Maw, MD;  Location: [facility];  Service: Neurosurgery;  Laterality: N/A;   MASTECTOMY Left    SPINAL CORD STIMULATOR REMOVAL N/A 03/01/2019   Procedure: LUMBAR SPINAL CORD STIMULATOR REMOVAL;   Surgeon: Luke Maw, MD;  Location: [facility];  Service: Neurosurgery;  Laterality: N/A;   WISDOM TOOTH EXTRACTION      Family History  Problem Relation Age of Onset   Heart disease Father        Living, 37   Breast cancer Mother        Living, 26   Hypercholesterolemia Mother    Healthy Brother    Healthy Sister     Social History:  reports that he quit smoking about 30 years ago. His smoking use included cigarettes. He has a 20.00 pack-year smoking history. He quit smokeless tobacco use about 30 years ago. He reports current alcohol use. He reports that he does not use drugs.  Allergies:  Allergies  Allergen Reactions   Erythromycin Itching    Medications: I have reviewed the patient's current medications.  Results for orders placed or performed during the hospital encounter of 11/28/21 (from the past 48 hour(s))  Comprehensive metabolic panel     Status: Abnormal   Collection Time: 11/28/21 10:59 PM  Result Value Ref Range   Sodium 137 135 - 145 mmol/L   Potassium 4.1 3.5 - 5.1 mmol/L   Chloride 99 98 - 111 mmol/L   CO2 24 22 - 32 mmol/L  Glucose, Bld 125 (H) 70 - 99 mg/dL    Comment: Glucose reference range applies only to samples taken after fasting for at least 8 hours.   BUN 46 (H) 8 - 23 mg/dL   Creatinine, Ser 2.54 (H) 0.61 - 1.24 mg/dL   Calcium 10.7 (H) 8.9 - 10.3 mg/dL   Total Protein 7.6 6.5 - 8.1 g/dL   Albumin 4.2 3.5 - 5.0 g/dL   AST 21 15 - 41 U/L   ALT 15 0 - 44 U/L   Alkaline Phosphatase 62 38 - 126 U/L   Total Bilirubin 0.4 0.3 - 1.2 mg/dL   GFR, Estimated 27 (L) >60 mL/min    Comment: (NOTE) Calculated using the CKD-EPI Creatinine Equation (2021)    Anion gap 14 5 - 15    Comment: Performed at [facility], [address] [place], [place] [address]  CBC with Differential     Status: Abnormal   Collection Time: 11/28/21 10:59 PM  Result Value Ref Range   WBC 9.7 4.0 - 10.5 K/uL   RBC 4.10 (L) 4.22 - 5.81 MIL/uL    Hemoglobin 12.3 (L) 13.0 - 17.0 g/dL   HCT 38.3 (L) 39.0 - 52.0 %   MCV 93.4 80.0 - 100.0 fL   MCH 30.0 26.0 - 34.0 pg   MCHC 32.1 30.0 - 36.0 g/dL   RDW 13.3 11.5 - 15.5 %   Platelets 254 150 - 400 K/uL   nRBC 0.0 0.0 - 0.2 %   Neutrophils Relative % 80 %   Neutro Abs 7.7 1.7 - 7.7 K/uL   Lymphocytes Relative 9 %   Lymphs Abs 0.9 0.7 - 4.0 K/uL   Monocytes Relative 9 %   Monocytes Absolute 0.9 0.1 - 1.0 K/uL   Eosinophils Relative 1 %   Eosinophils Absolute 0.1 0.0 - 0.5 K/uL   Basophils Relative 1 %   Basophils Absolute 0.1 0.0 - 0.1 K/uL   Immature Granulocytes 0 %   Abs Immature Granulocytes 0.02 0.00 - 0.07 K/uL    Comment: Performed at [facility], [place], [place] [address]  Urinalysis, Routine w reflex microscopic Urine, Catheterized     Status: Abnormal   Collection Time: 11/29/21  1:30 AM  Result Value Ref Range   Color, Urine YELLOW YELLOW   APPearance CLEAR CLEAR   Specific Gravity, Urine >1.046 (H) 1.005 - 1.030   pH 6.0 5.0 - 8.0   Glucose, UA NEGATIVE NEGATIVE mg/dL   Hgb urine dipstick LARGE (A) NEGATIVE   Bilirubin Urine NEGATIVE NEGATIVE   Ketones, ur NEGATIVE NEGATIVE mg/dL   Protein, ur 30 (A) NEGATIVE mg/dL   Nitrite NEGATIVE NEGATIVE   Leukocytes,Ua MODERATE (A) NEGATIVE   RBC / HPF >50 (H) 0 - 5 RBC/hpf   WBC, UA >50 (H) 0 - 5 WBC/hpf   Bacteria, UA RARE (A) NONE SEEN   Squamous Epithelial / LPF 0-5 0 - 5   WBC Clumps PRESENT    Mucus PRESENT    Ca Oxalate Crys, UA PRESENT     Comment: Performed at [facility], [address] [place], [place] [address]  CBC     Status: Abnormal   Collection Time: 11/29/21 10:56 AM  Result Value Ref Range   WBC 10.8 (H) 4.0 - 10.5 K/uL   RBC 3.65 (L) 4.22 - 5.81 MIL/uL   Hemoglobin 11.1 (L) 13.0 - 17.0 g/dL   HCT 34.9 (L) 39.0 - 52.0 %   MCV  95.6 80.0 - 100.0 fL   MCH 30.4 26.0 - 34.0 pg   MCHC 31.8 30.0 - 36.0 g/dL   RDW 13.3 11.5 - 15.5 %    Platelets 217 150 - 400 K/uL   nRBC 0.0 0.0 - 0.2 %    Comment: Performed at [facility], [place] [address] [place], [place] [address]  Basic metabolic panel     Status: Abnormal   Collection Time: 11/29/21 10:56 AM  Result Value Ref Range   Sodium 137 135 - 145 mmol/L   Potassium 4.6 3.5 - 5.1 mmol/L   Chloride 105 98 - 111 mmol/L   CO2 25 22 - 32 mmol/L   Glucose, Bld 116 (H) 70 - 99 mg/dL    Comment: Glucose reference range applies only to samples taken after fasting for at least 8 hours.   BUN 43 (H) 8 - 23 mg/dL   Creatinine, Ser 2.44 (H) 0.61 - 1.24 mg/dL   Calcium 9.6 8.9 - 10.3 mg/dL   GFR, Estimated 28 (L) >60 mL/min    Comment: (NOTE) Calculated using the CKD-EPI Creatinine Equation (2021)    Anion gap 7 5 - 15    Comment: Performed at [facility], [place] [address] [place], [place] [address]  Magnesium     Status: None   Collection Time: 11/29/21 10:56 AM  Result Value Ref Range   Magnesium 2.3 1.7 - 2.4 mg/dL    Comment: Performed at [facility], [place] [address] [place], [place] [address]  HIV Antibody (routine testing w rflx)     Status: None   Collection Time: 11/30/21  4:22 AM  Result Value Ref Range   HIV Screen 4th Generation wRfx Non Reactive Non Reactive    Comment: Performed at [facility], [place] [address], [place], [place] [address]  CBC     Status: Abnormal   Collection Time: 11/30/21  4:22 AM  Result Value Ref Range   WBC 10.0 4.0 - 10.5 K/uL   RBC 3.59 (L) 4.22 - 5.81 MIL/uL   Hemoglobin 10.7 (L) 13.0 - 17.0 g/dL   HCT 33.5 (L) 39.0 - 52.0 %   MCV 93.3 80.0 - 100.0 fL   MCH 29.8 26.0 - 34.0 pg   MCHC 31.9 30.0 - 36.0 g/dL   RDW 13.2 11.5 - 15.5 %   Platelets 209 150 - 400 K/uL   nRBC 0.0 0.0 - 0.2 %    Comment: Performed at [facility], [place] [address], [place], [place] [address]  Comprehensive metabolic panel     Status: Abnormal   Collection Time: 11/30/21  4:22 AM   Result Value Ref Range   Sodium 137 135 - 145 mmol/L   Potassium 4.2 3.5 - 5.1 mmol/L   Chloride 104 98 - 111 mmol/L   CO2 24 22 - 32 mmol/L   Glucose, Bld 117 (H) 70 - 99 mg/dL    Comment: Glucose reference range applies only to samples taken after fasting for at least 8 hours.   BUN 37 (H) 8 - 23 mg/dL   Creatinine, Ser 1.87 (H) 0.61 - 1.24 mg/dL   Calcium 9.5 8.9 - 10.3 mg/dL   Total Protein 6.6 6.5 - 8.1 g/dL   Albumin 3.1 (L) 3.5 - 5.0 g/dL   AST 27 15 - 41 U/L   ALT 21 0 - 44 U/L   Alkaline Phosphatase 52 38 - 126 U/L   Total Bilirubin 0.6 0.3 -  1.2 mg/dL   GFR, Estimated 38 (L) >60 mL/min    Comment: (NOTE) Calculated using the CKD-EPI Creatinine Equation (2021)    Anion gap 9 5 - 15    Comment: Performed at [facility], [place] [address] [place], [place] [address]    DG Abd Portable 1V  Result Date: 11/30/2021 CLINICAL DATA:  71 year old male with recent abdominal pain. Right side obstructive uropathy. Small-bowel obstruction or ileus. EXAM: PORTABLE ABDOMEN - 1 VIEW COMPARISON:  CT Abdomen and Pelvis 11/28/2021 and earlier. FINDINGS: Portable AP supine view at 0905 hours. New right double-J ureteral stent. Gas-filled stomach and bowel loops throughout the abdomen and pelvis. Central small bowel loops up to 4 cm diameter appear increased from the recent CT. But bowel gas is present to the rectum. Previous lumbar fusion. No acute osseous abnormality identified. Stable lung bases. No pneumoperitoneum identified on these supine views. IMPRESSION: 1. New right double-J ureteral stent. 2. Increased gaseous distension of the stomach and bowel diffusely throughout the abdomen since 11/28/2021. But the pattern favors ileus over mechanical obstruction at this time. Electronically Signed   By: Genevie Ann M.D.   On: 11/30/2021 09:43   DG C-Arm 1-60 Min-No Report  Result Date: 11/29/2021 Fluoroscopy was utilized by the requesting physician.  No radiographic  interpretation.   CT Abdomen Pelvis W Contrast  Result Date: 11/29/2021 CLINICAL DATA:  Abdominal pain, constipation and suspected bowel obstruction. EXAM: CT ABDOMEN AND PELVIS WITH CONTRAST TECHNIQUE: Multidetector CT imaging of the abdomen and pelvis was performed using the standard protocol following bolus administration of intravenous contrast. RADIATION DOSE REDUCTION: This exam was performed according to the departmental dose-optimization program which includes automated exposure control, adjustment of the mA and/or kV according to patient size and/or use of iterative reconstruction technique. CONTRAST:  76m OMNIPAQUE IOHEXOL 300 MG/ML  SOLN COMPARISON:  CT without contrast 04/29/2020 FINDINGS: Lower chest: There are increasing atelectatic bands in both lung bases but no acute consolidative pneumonia. The cardiac size is normal. There calcifications in the aortic valve plane. No pericardial effusion. Hepatobiliary: There is mild hepatic steatosis without mass enhancement. Gallbladder is distended to 11.8 cm in length but no calcified stones or wall thickening are noted and no biliary dilatation. Pancreas: Moderately fatty replaced. No mass enhancement or inflammatory change. Spleen: Unremarkable. Adrenals/Urinary Tract: There is no adrenal mass. Again noted is a 12 cm uncomplicated thin walled homogeneous cyst in the lower pole of the right kidney measuring 12 cm and 1.6 Hounsfield units, and a left renal cyst laterally in the lower pole measuring 3 cm and 1.2 Hounsfield units. There is a 3 mm nonobstructive caliceal stone in the inferior pole left kidney. No other left renal stone is seen. There are scattered nonobstructing stones within the right kidney collecting system measuring from 2-4 mm. There is new demonstration of moderate right hydroureteronephrosis due to a 7 x 4 x 5 mm distal right ureteral stone approximately 6 cm from the UVJ. In the delayed phase no cortical contrast excretion is seen  on the right but it is well seen on the left. The bladder is contracted around a Foley balloon and not well seen. Stomach/Bowel: The gastric wall is unremarkable. The duodenum and jejunum are decompressed but there are mildly dilated ileal segments in the mid to lower abdomen primarily to the left measuring up to 3.2 cm, without a visible transitional segment and with decompression of the right lower and low central abdominal small bowel. The appendix is normal.  There is abundant fecal material in the ascending and transverse colon, left colonic diverticula without evidence of diverticulitis or colitis. Vascular/Lymphatic: Aortic atherosclerosis. No enlarged abdominal or pelvic lymph nodes. Reproductive: Enlarged prostate, 6 cm transverse diameter bladder impression, unchanged in size. Other: Small umbilical fat hernia. Small left inguinal fat hernia. Multiple pelvic phleboliths. There is no free air, free hemorrhage, free fluid or acute inflammatory changes. Musculoskeletal: Osteopenia. Old L4-S1 posterior fusion construct with solid fusions. Chronic T12 and L1 compression fractures are again noted. No acute or other significant osseous findings. IMPRESSION: 1. 7 x 4 x 5 mm distal right ureteral stone 6 cm from the UVJ, with moderate obstructive uropathy. Correlate clinically for infectious complication. 2. Nonobstructive nephrolithiasis. 3. Multiple mildly dilated fluid-filled small bowel segments primarily the left mid to lower abdomen, decompression in the low central 2 right lower quadrant small bowel. 4. Intermediate grade small bowel obstruction suspected but etiology is indeterminate as the transitional segment could not be found. Possibilities include occult adhesions and occult internal hernia. There is no bowel pneumatosis or mesenteric edema. Consultation with a surgeon is recommended. 5. Abundant stool ascending and transverse colon. Diverticula without diverticulitis. 6. Renal cysts. 7. Enlarged  prostate with bladder impression, bladder catheterization. 8. Osteopenia, degenerative and postsurgical changes of the spine. Right small umbilical and left inguinal fat hernias. 9. Distended but otherwise unremarkable gallbladder. Electronically Signed   By: Telford Nab M.D.   On: 11/29/2021 00:20    Review of Systems  Constitutional: Negative.   HENT: Negative.    Eyes: Negative.   Respiratory: Negative.    Cardiovascular: Negative.   Gastrointestinal:  Positive for abdominal distention, nausea and vomiting.  Endocrine: Negative.   Genitourinary: Negative.   Musculoskeletal:  Positive for gait problem.  Skin: Negative.   Allergic/Immunologic: Negative.   Hematological: Negative.   Psychiatric/Behavioral: Negative.     Blood pressure (!) 149/84, pulse 80, temperature 98.4 F (36.9 C), temperature source Oral, resp. rate 17, height '5\' 10"'$  (1.778 m), weight 90.7 kg, SpO2 97 %. Physical Exam Vitals reviewed.  Constitutional:      General: He is not in acute distress.    Appearance: Normal appearance. He is obese.  HENT:     Head: Normocephalic and atraumatic.     Right Ear: External ear normal.     Left Ear: External ear normal.     Nose: Nose normal.     Mouth/Throat:     Mouth: Mucous membranes are moist.     Pharynx: Oropharynx is clear.  Eyes:     General: No scleral icterus.    Extraocular Movements: Extraocular movements intact.     Conjunctiva/sclera: Conjunctivae normal.     Pupils: Pupils are equal, round, and reactive to light.  Cardiovascular:     Rate and Rhythm: Normal rate and regular rhythm.     Pulses: Normal pulses.     Heart sounds: Normal heart sounds.  Pulmonary:     Effort: Pulmonary effort is normal. No respiratory distress.     Breath sounds: Normal breath sounds.  Abdominal:     General: There is distension.     Palpations: Abdomen is soft.     Comments: There is mild tenderness. Few bs  Musculoskeletal:        General: Swelling and signs of  injury present. Normal range of motion.     Cervical back: Normal range of motion and neck supple.  Skin:    General: Skin is warm and dry.  Coloration: Skin is not jaundiced.  Neurological:     General: No focal deficit present.     Mental Status: He is alert and oriented to person, place, and time.  Psychiatric:        Mood and Affect: Mood normal.        Behavior: Behavior normal.     Assessment/Plan: The patient appears to have an ileus versus small bowel obstruction.  If he is vomiting then he should have an NG tube to help relieve this.  If he gets an NG tube then I would recommend starting the small bowel protocol.  Otherwise I would continue with bowel rest and not feed him until his abdominal distention improves.  He should not have pills until his abdominal distention improves.  We will follow him closely with you.  Autumn Messing III 11/30/2021, 11:31 AM

## 2021-11-30 NOTE — Progress Notes (Signed)
Mobility Specialist Cancellation/Refusal Note:   Reason for Cancellation/Refusal: Pt declined mobility at this time. Pt verbalized having some pain & nausea. Pt stated he will try to ambulate in the afternoon. Will check back as schedule permits.      [facility]

## 2021-12-01 ENCOUNTER — Inpatient Hospital Stay (HOSPITAL_COMMUNITY): Payer: Medicare Other

## 2021-12-01 DIAGNOSIS — K56609 Unspecified intestinal obstruction, unspecified as to partial versus complete obstruction: Secondary | ICD-10-CM | POA: Diagnosis not present

## 2021-12-01 DIAGNOSIS — N179 Acute kidney failure, unspecified: Secondary | ICD-10-CM | POA: Diagnosis not present

## 2021-12-01 DIAGNOSIS — N1339 Other hydronephrosis: Secondary | ICD-10-CM | POA: Diagnosis not present

## 2021-12-01 DIAGNOSIS — G894 Chronic pain syndrome: Secondary | ICD-10-CM | POA: Diagnosis not present

## 2021-12-01 DIAGNOSIS — R9431 Abnormal electrocardiogram [ECG] [EKG]: Secondary | ICD-10-CM | POA: Diagnosis not present

## 2021-12-01 DIAGNOSIS — N139 Obstructive and reflux uropathy, unspecified: Secondary | ICD-10-CM | POA: Diagnosis not present

## 2021-12-01 DIAGNOSIS — N3000 Acute cystitis without hematuria: Secondary | ICD-10-CM

## 2021-12-01 LAB — BASIC METABOLIC PANEL
Anion gap: 9 (ref 5–15)
BUN: 31 mg/dL — ABNORMAL HIGH (ref 8–23)
CO2: 23 mmol/L (ref 22–32)
Calcium: 9.3 mg/dL (ref 8.9–10.3)
Chloride: 106 mmol/L (ref 98–111)
Creatinine, Ser: 1.37 mg/dL — ABNORMAL HIGH (ref 0.61–1.24)
GFR, Estimated: 56 mL/min — ABNORMAL LOW (ref >60)
Glucose, Bld: 92 mg/dL (ref 70–99)
Potassium: 4 mmol/L (ref 3.5–5.1)
Sodium: 138 mmol/L (ref 135–145)

## 2021-12-01 LAB — CBC
HCT: 32.2 % — ABNORMAL LOW (ref 39.0–52.0)
Hemoglobin: 10.6 g/dL — ABNORMAL LOW (ref 13.0–17.0)
MCH: 30.2 pg (ref 26.0–34.0)
MCHC: 32.9 g/dL (ref 30.0–36.0)
MCV: 91.7 fL (ref 80.0–100.0)
Platelets: 219 10*3/uL (ref 150–400)
RBC: 3.51 MIL/uL — ABNORMAL LOW (ref 4.22–5.81)
RDW: 13 % (ref 11.5–15.5)
WBC: 11.5 10*3/uL — ABNORMAL HIGH (ref 4.0–10.5)
nRBC: 0 % (ref 0.0–0.2)

## 2021-12-01 MED ORDER — ONDANSETRON HCL 4 MG PO TABS: 4.0000 mg | ORAL_TABLET | Freq: Three times a day (TID) | ORAL | 0 refills | Status: DC | PRN

## 2021-12-01 MED ORDER — LINACLOTIDE 145 MCG PO CAPS: 145.0000 ug | ORAL_CAPSULE | Freq: Every day | ORAL | 3 refills | Status: DC

## 2021-12-01 MED ORDER — CIPROFLOXACIN HCL 500 MG PO TABS: 500.0000 mg | ORAL_TABLET | Freq: Two times a day (BID) | ORAL | Status: DC

## 2021-12-01 MED ORDER — NITROFURANTOIN MONOHYD MACRO 100 MG PO CAPS: 100.0000 mg | ORAL_CAPSULE | Freq: Every day | ORAL | 0 refills | Status: AC

## 2021-12-01 MED ORDER — CIPROFLOXACIN HCL 500 MG PO TABS: 500.0000 mg | ORAL_TABLET | Freq: Two times a day (BID) | ORAL | 0 refills | Status: AC

## 2021-12-01 MED ORDER — VITAMIN D 25 MCG (1000 UNIT) PO TABS: 2000.0000 [IU] | ORAL_TABLET | Freq: Every day | ORAL | Status: DC

## 2021-12-01 MED ORDER — CYANOCOBALAMIN 500 MCG PO TABS: 1000.0000 ug | ORAL_TABLET | Freq: Two times a day (BID) | ORAL | Status: DC

## 2021-12-01 NOTE — Evaluation (Addendum)
Physical Therapy Evaluation-1x  Patient Details Name: Luke Foster MRN: [id number] DOB: 10-07-1950 Today's Date: 12/01/2021  History of Present Illness  71 yo male admitted with obstructive uropathy, AKI. S/P R double J stent placement 10/21. Hx of chronic pain requiring opioids, L-S radiculopathy, spinal cord stimulator, falls.  Clinical Impression  On eval, pt was Min guard A for mobility. He walked ~75 feet with a RW. No LOB with RW use. He tolerated activity well. Discussed d/c plan-pt lives alone but has daily aide assistance. Pt is agreeable to d/c home on today. He politely declines HHPT f/u. 1x eval.     Addendum: Notified by [facility] team that POA is requesting home health. Will update note and recommend HHPT.      Recommendations for follow up therapy are one component of a multi-disciplinary discharge planning process, led by the attending physician.  Recommendations may be updated based on patient status, additional functional criteria and insurance authorization.  Follow Up Recommendations Home Health PT      Assistance Recommended at Discharge Intermittent Supervision/Assistance  Patient can return home with the following  Help with stairs or ramp for entrance;Assistance with cooking/housework;Assist for transportation    Equipment Recommendations None recommended by PT  Recommendations for Other Services       Functional Status Assessment Patient has not had a recent decline in their functional status     Precautions / Restrictions Precautions Precautions: Fall Restrictions Weight Bearing Restrictions: No      Mobility  Bed Mobility               General bed mobility comments: oob in recliner    Transfers Overall transfer level: Needs assistance Equipment used: Rolling walker (2 wheels) Transfers: Sit to/from Stand Sit to Stand: Min guard           General transfer comment: Increased time. Good use of hands.    Ambulation/Gait Ambulation/Gait  assistance: Min guard Gait Distance (Feet): 75 Feet Assistive device: Rolling walker (2 wheels) Gait Pattern/deviations: Step-to pattern, Step-through pattern, Decreased step length - right, Trunk flexed       General Gait Details: Intermittent decreased step length on R side. No LOB with RW use. Pt denied dizziness. Tolerated distance well.  Stairs            Wheelchair Mobility    Modified Rankin (Stroke Patients Only)       Balance Overall balance assessment: Needs assistance, History of Falls         Standing balance support: Bilateral upper extremity supported, During functional activity, Reliant on assistive device for balance                                 Pertinent Vitals/Pain Pain Assessment Pain Assessment: Faces Faces Pain Scale: Hurts a little bit Pain Location: chronic "nothing out of the normal" Pain Intervention(s): Monitored during session    Home Living Family/patient expects to be discharged to:: Private residence Living Arrangements: Alone Available Help at Discharge: Available PRN/intermittently (aide daily) Type of Home: House Home Access: Ramped entrance       Home Layout: One level Home Equipment: Shower seat - built Medical sales representative (2 wheels);Cane - single point;Hand held shower head      Prior Function Prior Level of Function : Independent/Modified Independent             Mobility Comments: uses RW ADLs Comments: assist for driving; mod ind  with bathing, dressing     Hand Dominance        Extremity/Trunk Assessment   Upper Extremity Assessment Upper Extremity Assessment: Overall WFL for tasks assessed    Lower Extremity Assessment Lower Extremity Assessment: Generalized weakness    Cervical / Trunk Assessment Cervical / Trunk Assessment: Kyphotic  Communication   Communication: No difficulties  Cognition Arousal/Alertness: Awake/alert Behavior During Therapy: WFL for tasks  assessed/performed Overall Cognitive Status: Within Functional Limits for tasks assessed                                          General Comments      Exercises     Assessment/Plan    PT Assessment Patient does not need any further PT services  PT Problem List         PT Treatment Interventions      PT Goals (Current goals can be found in the Care Plan section)  Acute Rehab PT Goals Patient Stated Goal: home today PT Goal Formulation: All assessment and education complete, DC therapy    Frequency       Co-evaluation               AM-PAC PT "6 Clicks" Mobility  Outcome Measure Help needed turning from your back to your side while in a flat bed without using bedrails?: None Help needed moving from lying on your back to sitting on the side of a flat bed without using bedrails?: None Help needed moving to and from a bed to a chair (including a wheelchair)?: A Little Help needed standing up from a chair using your arms (e.g., wheelchair or bedside chair)?: A Little Help needed to walk in hospital room?: A Little Help needed climbing 3-5 steps with a railing? : A Lot 6 Click Score: 19    End of Session Equipment Utilized During Treatment: Gait belt Activity Tolerance: Patient tolerated treatment well Patient left: in chair;with call bell/phone within reach        Time: 1400-1418 PT Time Calculation (min) (ACUTE ONLY): 18 min   Charges:   PT Evaluation $PT Eval Low Complexity: [place], PT Acute Rehabilitation  Office: [phone]

## 2021-12-01 NOTE — Discharge Summary (Signed)
Physician Discharge Summary   Patient: Luke Foster MRN: [id number] DOB: Aug 12, 1950  Admit date:     11/28/2021  Discharge date: 12/01/21  Discharge Physician: Estill Cotta, MD    PCP: [name], MD   Recommendations at discharge:   Ciprofloxacin 500 mg twice daily for 7 days then continue Macrobid 100 mg nightly until after surgery (per urology recommendations) Avoid constipation, started on Linzess 145 mg daily QAM  Discharge Diagnoses:    Obstructive uropathy   Lumbosacral radiculopathy   AKI (acute kidney injury) ([place])   Intractable nausea and vomiting   Ileus (HCC)   Abnormal ECG   Atherosclerosis of abdominal aorta (HCC)   Benign essential HTN   Hyperlipidemia, mixed Chronic back pain   Constipation   Nephrolithiasis Dementia   Hospital Course:  Patient is a 71 year old male with osteoarthritis, chronic back pain, lumbosacral radiculopathy with spinal cord stimulator, history of nephrolithiasis, history of bilateral hydronephrosis, hyperlipidemia, neurogenic bladder with indwelling Foley, chronic constipation presented to ED with abdominal pain and constipation.  Patient was seen in [facility] ED on Wednesday, given an enema successfully and was able to go home.  However he returned with more abdominal pain, distention, right flank pain, nausea.   CT showed 7 mm right distal ureteral stone 6 cm from UVJ with moderate obstructive uropathy.  Multiple mildly dilated fluid-filled small bowel segments primarily the left mid to lower abdomen, intermediate grade small bowel obstruction possibly include occult lesions and occult internal hernia.   Assessment and Plan:  Abdominal pain with distal ureteral stone, obstructive uropathy in the setting of neurogenic bladder with indwelling Foley -Urology following, underwent cystoscopy, right retrograde pyelogram, ureteroscopy and insertion of right double-J stent on 10/21, postop day # 2 -Recent urine culture on 10/13 had  shown Enterobacter, UA on admission positive for UTI, patient was placed on IV cefepime. Creatinine has normalized, 1.3 Discussed with Dr. Gloriann Loan, urology, cleared to be discharged home, recommended ciprofloxacin 500 mg twice daily for 7 days then continue Macrobid 100 mg nightly until after surgery. -Continue Foley catheter   Intractable nausea and vomiting, ileus versus SBO - In the setting of chronic constipation -CT abdomen had shown indeterminate grade small bowel obstruction  -KUB showed ileus.  Due to the concern for possible early SBO, general surgery was consulted. -Patient was also placed on good bowel regimen and started on linzess besides the stool softener.   -Patient had a good large BM yesterday and has been tolerating diet, passing gas.   -[facility]vance to soft solids, will DC home today       Abnormal ECG -Persistent nausea, troponin 19->21, EKG overnight with ?  ST elevation/pericarditis -No complaints of chest pain on my exam, repeat EKG, ESR, CRP, troponin -Cardiology consulted, d/w Dr Tomasa Blase, [facility] reviewed, patient has persistent ST elevations in the previous EKGs, not new.  No further cardiac work-up recommended.     AKI (acute kidney injury) ([facility]) -Likely due to #1, creatinine 1.7 on 11/26/2021 presented with creatinine of 2.54 -Status post stent placement -Creatinine improved to 1.3   Chronic back pain with lumbosacral radiculopathy -Use narcotics judiciously given ileus/SBO -On outpatient oxycodone 15 mg every 6 hours as needed -Placed on bowel regimen, Colace, MiraLAX, linzess       Benign essential HTN -Continue diltiazem   Anxiety/depression, dementia -Continue venlafaxine       Pain control - [place] Controlled Substance Reporting System database was reviewed. and patient was instructed, not to drive, operate heavy machinery,  perform activities at heights, swimming or participation in water activities or provide baby-sitting services while on  Pain, Sleep and Anxiety Medications; until their outpatient Physician has advised to do so again. Also recommended to not to take more than prescribed Pain, Sleep and Anxiety Medications.  Consultants: GI, general surgery Procedures performed: cystoscopy, right retrograde pyelogram, ureteroscopy and insertion of right double-J stent on 10/21 Disposition: Home Diet recommendation:  Soft diet DISCHARGE MEDICATION: Allergies as of 12/01/2021       Reactions   Erythromycin Itching        Medication List     STOP taking these medications    enalapril 5 MG tablet Commonly known as: VASOTEC   senna-docusate 8.6-50 MG tablet Commonly known as: Senokot-S   sulfamethoxazole-trimethoprim 800-160 MG tablet Commonly known as: BACTRIM DS       TAKE these medications    acetaminophen 500 MG tablet Commonly known as: TYLENOL Take 1,000 mg by mouth every 6 (six) hours as needed for mild pain or moderate pain.   ascorbic acid 500 MG tablet Commonly known as: VITAMIN C Take 500 mg by mouth daily.   cholecalciferol 25 MCG (1000 UNIT) tablet Commonly known as: VITAMIN D3 Take 2 tablets (2,000 Units total) by mouth daily.   ciprofloxacin 500 MG tablet Commonly known as: Cipro Take 1 tablet (500 mg total) by mouth 2 (two) times daily for 7 days. One po bid x 7 days What changed: additional instructions   cyanocobalamin 500 MCG tablet Commonly known as: VITAMIN B12 Take 2 tablets (1,000 mcg total) by mouth 2 (two) times daily.   diltiazem 120 MG 24 hr capsule Commonly known as: CARDIZEM CD Take 1 capsule (120 mg total) by mouth every evening.   docusate sodium 100 MG capsule Commonly known as: COLACE Take 1 capsule (100 mg total) by mouth in the morning, at noon, and at bedtime. What changed:  how much to take when to take this   linaclotide 145 MCG Caps capsule Commonly known as: LINZESS Take 1 capsule (145 mcg total) by mouth daily before breakfast. Start taking on:  December 02, 2021   mirabegron ER 50 MG Tb24 tablet Commonly known as: MYRBETRIQ Take 1 tablet (50 mg total) by mouth daily.   nitrofurantoin (macrocrystal-monohydrate) 100 MG capsule Commonly known as: Macrobid Take 1 capsule (100 mg total) by mouth at bedtime. Start on 12/08/21 Start taking on: December 08, 2021   ondansetron 4 MG tablet Commonly known as: ZOFRAN Take 1 tablet (4 mg total) by mouth every 8 (eight) hours as needed for nausea or vomiting.   oxyCODONE 15 MG immediate release tablet Commonly known as: ROXICODONE Take 1 tablet (15 mg total) by mouth every 6 (six) hours as needed for pain.   polyethylene glycol 17 g packet Commonly known as: MIRALAX / GLYCOLAX Take 17 g by mouth 2 (two) times daily. What changed:  when to take this reasons to take this   rOPINIRole 0.5 MG tablet Commonly known as: REQUIP Take 0.5 mg by mouth at bedtime as needed (restless legs).   solifenacin 5 MG tablet Commonly known as: VESICARE Take 5 mg by mouth daily.   tamsulosin 0.4 MG Caps capsule Commonly known as: FLOMAX Take 1 capsule (0.4 mg total) by mouth every evening.   venlafaxine XR 75 MG 24 hr capsule Commonly known as: EFFEXOR-XR Take 1 capsule (75 mg total) by mouth in the morning and at bedtime.        Follow-up Information  [name], MD. Schedule an appointment as soon as possible for a visit in 2 week(s).   Specialty: Internal Medicine Why: for hospital follow-up Contact information: [address] [place] [address] [phone]         Lucas Mallow, MD. Schedule an appointment as soon as possible for a visit in 10 day(s).   Specialty: Urology Why: for hospital follow-up Contact information: [place] [id number] [phone]                Discharge Exam: Danley Danker Weights   11/28/21 1915  Weight: 90.7 kg   S: Had 1 large BM yesterday, passing flatus, tolerating full liquid diet without any  difficulty.  Advance diet this morning.  No acute complaints.  Vitals:   11/30/21 0435 11/30/21 1348 11/30/21 1954 12/01/21 0458  BP: (!) 149/84 (!) 157/90 130/82 (!) 159/93  Pulse: 80 79 80 78  Resp:  _0 Temp: 98.4 F (36.9 C) 98.7 F (37.1 C) 98.7 F (37.1 C) 98.5 F (36.9 C)  TempSrc: Oral Oral Oral Oral  SpO2: 97% 100% 100% 95%  Weight:      Height:        Physical Exam General: Alert and oriented x self, place,   Cardiovascular: S1 S2 clear, RRR.  Respiratory: CTAB, no wheezing, rales or rhonchi Gastrointestinal: Soft, nontender, nondistended, NBS Ext: no pedal edema bilaterally Neuro: no new deficits Psych: dementia   Condition at discharge: fair  The results of significant diagnostics from this hospitalization (including imaging, microbiology, ancillary and laboratory) are listed below for reference.   Imaging Studies: DG Abd Portable 1V  Result Date: 12/01/2021 CLINICAL DATA:  Follow-up ileus EXAM: PORTABLE ABDOMEN - 1 VIEW COMPARISON:  Abdominal radiograph dated November 30, 2021 go to X FINDINGS: Multiple gas-filled mildly dilated loops of small bowel with gas seen in the colon, similar to prior exam. Right-sided ureter stent expected position. Posterior fusion hardware of the lower lumbar spine. IMPRESSION: Mildly dilated loops of small bowel with gas seen in the colon, similar to prior exam and likely due to ileus. Electronically Signed   By: Yetta Glassman M.D.   On: 12/01/2021 08:12   DG Abd Portable 1V  Result Date: 11/30/2021 CLINICAL DATA:  71 year old male with recent abdominal pain. Right side obstructive uropathy. Small-bowel obstruction or ileus. EXAM: PORTABLE ABDOMEN - 1 VIEW COMPARISON:  CT Abdomen and Pelvis 11/28/2021 and earlier. FINDINGS: Portable AP supine view at 0905 hours. New right double-J ureteral stent. Gas-filled stomach and bowel loops throughout the abdomen and pelvis. Central small bowel loops up to 4 cm diameter appear increased  from the recent CT. But bowel gas is present to the rectum. Previous lumbar fusion. No acute osseous abnormality identified. Stable lung bases. No pneumoperitoneum identified on these supine views. IMPRESSION: 1. New right double-J ureteral stent. 2. Increased gaseous distension of the stomach and bowel diffusely throughout the abdomen since 11/28/2021. But the pattern favors ileus over mechanical obstruction at this time. Electronically Signed   By: Genevie Ann M.D.   On: 11/30/2021 09:43   DG C-Arm 1-60 Min-No Report  Result Date: 11/29/2021 Fluoroscopy was utilized by the requesting physician.  No radiographic interpretation.   CT Abdomen Pelvis W Contrast  Result Date: 11/29/2021 CLINICAL DATA:  Abdominal pain, constipation and suspected bowel obstruction. EXAM: CT ABDOMEN AND PELVIS WITH CONTRAST TECHNIQUE: Multidetector CT imaging of the abdomen and pelvis was performed using the standard protocol following bolus administration  of intravenous contrast. RADIATION DOSE REDUCTION: This exam was performed according to the departmental dose-optimization program which includes automated exposure control, adjustment of the mA and/or kV according to patient size and/or use of iterative reconstruction technique. CONTRAST:  52m OMNIPAQUE IOHEXOL 300 MG/ML  SOLN COMPARISON:  CT without contrast 04/29/2020 FINDINGS: Lower chest: There are increasing atelectatic bands in both lung bases but no acute consolidative pneumonia. The cardiac size is normal. There calcifications in the aortic valve plane. No pericardial effusion. Hepatobiliary: There is mild hepatic steatosis without mass enhancement. Gallbladder is distended to 11.8 cm in length but no calcified stones or wall thickening are noted and no biliary dilatation. Pancreas: Moderately fatty replaced. No mass enhancement or inflammatory change. Spleen: Unremarkable. Adrenals/Urinary Tract: There is no adrenal mass. Again noted is a 12 cm uncomplicated thin walled  homogeneous cyst in the lower pole of the right kidney measuring 12 cm and 1.6 Hounsfield units, and a left renal cyst laterally in the lower pole measuring 3 cm and 1.2 Hounsfield units. There is a 3 mm nonobstructive caliceal stone in the inferior pole left kidney. No other left renal stone is seen. There are scattered nonobstructing stones within the right kidney collecting system measuring from 2-4 mm. There is new demonstration of moderate right hydroureteronephrosis due to a 7 x 4 x 5 mm distal right ureteral stone approximately 6 cm from the UVJ. In the delayed phase no cortical contrast excretion is seen on the right but it is well seen on the left. The bladder is contracted around a Foley balloon and not well seen. Stomach/Bowel: The gastric wall is unremarkable. The duodenum and jejunum are decompressed but there are mildly dilated ileal segments in the mid to lower abdomen primarily to the left measuring up to 3.2 cm, without a visible transitional segment and with decompression of the right lower and low central abdominal small bowel. The appendix is normal. There is abundant fecal material in the ascending and transverse colon, left colonic diverticula without evidence of diverticulitis or colitis. Vascular/Lymphatic: Aortic atherosclerosis. No enlarged abdominal or pelvic lymph nodes. Reproductive: Enlarged prostate, 6 cm transverse diameter bladder impression, unchanged in size. Other: Small umbilical fat hernia. Small left inguinal fat hernia. Multiple pelvic phleboliths. There is no free air, free hemorrhage, free fluid or acute inflammatory changes. Musculoskeletal: Osteopenia. Old L4-S1 posterior fusion construct with solid fusions. Chronic T12 and L1 compression fractures are again noted. No acute or other significant osseous findings. IMPRESSION: 1. 7 x 4 x 5 mm distal right ureteral stone 6 cm from the UVJ, with moderate obstructive uropathy. Correlate clinically for infectious complication. 2.  Nonobstructive nephrolithiasis. 3. Multiple mildly dilated fluid-filled small bowel segments primarily the left mid to lower abdomen, decompression in the low central 2 right lower quadrant small bowel. 4. Intermediate grade small bowel obstruction suspected but etiology is indeterminate as the transitional segment could not be found. Possibilities include occult adhesions and occult internal hernia. There is no bowel pneumatosis or mesenteric edema. Consultation with a surgeon is recommended. 5. Abundant stool ascending and transverse colon. Diverticula without diverticulitis. 6. Renal cysts. 7. Enlarged prostate with bladder impression, bladder catheterization. 8. Osteopenia, degenerative and postsurgical changes of the spine. Right small umbilical and left inguinal fat hernias. 9. Distended but otherwise unremarkable gallbladder. Electronically Signed   By: KTelford NabM.D.   On: 11/29/2021 00:20    Microbiology: Results for orders placed or performed during the hospital encounter of 11/21/21  Remove and replace urinary cath (  placed > 5 days) then obtain urine culture from new indwelling urinary catheter.     Status: Abnormal   Collection Time: 11/21/21  2:12 AM   Specimen: Urine, Catheterized  Result Value Ref Range Status   Specimen Description   Final    URINE, CATHETERIZED Performed at [facility], [address] [place], [place] [address]    Special Requests   Final    NONE Performed at [facility], [address], [place], [place] [address]    Culture 80,000 COLONIES/mL ENTEROBACTER CLOACAE (A)  Final   Report Status 11/23/2021 FINAL  Final   Organism ID, Bacteria ENTEROBACTER CLOACAE (A)  Final      Susceptibility   Enterobacter cloacae - MIC*    CEFAZOLIN >=64 RESISTANT Resistant     CEFEPIME 2 SENSITIVE Sensitive     CIPROFLOXACIN <=0.25 SENSITIVE Sensitive     GENTAMICIN <=1 SENSITIVE Sensitive     IMIPENEM 0.5 SENSITIVE Sensitive      NITROFURANTOIN <=16 SENSITIVE Sensitive     TRIMETH/SULFA >=320 RESISTANT Resistant     PIP/TAZO 8 SENSITIVE Sensitive     * 80,000 COLONIES/mL ENTEROBACTER CLOACAE    Labs: CBC: Recent Labs  Lab 11/26/21 1233 11/28/21 2259 11/29/21 1056 11/30/21 0422 12/01/21 0820  WBC 14.7* 9.7 10.8* 10.0 11.5*  NEUTROABS 13.2* 7.7  --   --   --   HGB 12.6* 12.3* 11.1* 10.7* 10.6*  HCT 37.9* 38.3* 34.9* 33.5* 32.2*  MCV 92.9 93.4 95.6 93.3 91.7  PLT 252 254 217 209 322   Basic Metabolic Panel: Recent Labs  Lab 11/26/21 1233 11/28/21 2259 11/29/21 1056 11/30/21 0422 12/01/21 0357  NA 137 137 137 137 138  K 4.4 4.1 4.6 4.2 4.0  CL 108 99 105 104 106  CO2 18* _0 GLUCOSE 112* 125* 116* 117* 92  BUN 25* 46* 43* 37* 31*  CREATININE 1.70* 2.54* 2.44* 1.87* 1.37*  CALCIUM 9.9 10.7* 9.6 9.5 9.3  MG  --   --  2.3  --   --    Liver Function Tests: Recent Labs  Lab 11/26/21 1233 11/28/21 2259 11/30/21 0422  AST _1 ALT _2 ALKPHOS 68 62 52  BILITOT 0.9 0.4 0.6  PROT 7.2 7.6 6.6  ALBUMIN 3.6 4.2 3.1*   CBG: No results for input(s): "GLUCAP" in the last 168 hours.  Discharge time spent: greater than 30 minutes.  Signed: Estill Cotta, MD [facility] 12/01/2021

## 2021-12-01 NOTE — Progress Notes (Signed)
2 Days Post-Op   Subjective/Chief Complaint: States nausea is better Abd less distended States he had a BM yesterday  Objective: Vital signs in last 24 hours: Temp:  [98.5 F (36.9 C)-98.7 F (37.1 C)] 98.5 F (36.9 C) (10/23 0458) Pulse Rate:  [78-80] 78 (10/23 0458) Resp:  [16-19] 16 (10/23 0458) BP: (130-159)/(82-93) 159/93 (10/23 0458) SpO2:  [95 %-100 %] 95 % (10/23 0458) Last BM Date : 11/26/21  Intake/Output from previous day: 10/22 0701 - 10/23 0700 In: 849.5 [P.O.:60; I.V.:789.5] Out: 2050 [Urine:2050] Intake/Output this shift: Total I/O In: 480 [P.O.:480] Out: -   PE:  Constitutional: No acute distress, conversant, appears states age. Eyes: Anicteric sclerae, moist conjunctiva, no lid lag Lungs: Clear to auscultation bilaterally, normal respiratory effort CV: regular rate and rhythm, no murmurs, no peripheral edema, pedal pulses 2+ GI: Soft, no masses or hepatosplenomegaly, non-tender to palpation Skin: No rashes, palpation reveals normal turgor Psychiatric: appropriate judgment and insight, oriented to person, place, and time   Lab Results:  Recent Labs    11/30/21 0422 12/01/21 0820  WBC 10.0 11.5*  HGB 10.7* 10.6*  HCT 33.5* 32.2*  PLT 209 219   BMET Recent Labs    11/30/21 0422 12/01/21 0357  NA 137 138  K 4.2 4.0  CL 104 106  CO2 24 23  GLUCOSE 117* 92  BUN 37* 31*  CREATININE 1.87* 1.37*  CALCIUM 9.5 9.3   PT/INR No results for input(s): "LABPROT", "INR" in the last 72 hours. ABG No results for input(s): "PHART", "HCO3" in the last 72 hours.  Invalid input(s): "PCO2", "PO2"  Studies/Results: DG Abd Portable 1V  Result Date: 12/01/2021 CLINICAL DATA:  Follow-up ileus EXAM: PORTABLE ABDOMEN - 1 VIEW COMPARISON:  Abdominal radiograph dated November 30, 2021 go to X FINDINGS: Multiple gas-filled mildly dilated loops of small bowel with gas seen in the colon, similar to prior exam. Right-sided ureter stent expected position.  Posterior fusion hardware of the lower lumbar spine. IMPRESSION: Mildly dilated loops of small bowel with gas seen in the colon, similar to prior exam and likely due to ileus. Electronically Signed   By: Yetta Glassman M.D.   On: 12/01/2021 08:12   DG Abd Portable 1V  Result Date: 11/30/2021 CLINICAL DATA:  71 year old male with recent abdominal pain. Right side obstructive uropathy. Small-bowel obstruction or ileus. EXAM: PORTABLE ABDOMEN - 1 VIEW COMPARISON:  CT Abdomen and Pelvis 11/28/2021 and earlier. FINDINGS: Portable AP supine view at 0905 hours. New right double-J ureteral stent. Gas-filled stomach and bowel loops throughout the abdomen and pelvis. Central small bowel loops up to 4 cm diameter appear increased from the recent CT. But bowel gas is present to the rectum. Previous lumbar fusion. No acute osseous abnormality identified. Stable lung bases. No pneumoperitoneum identified on these supine views. IMPRESSION: 1. New right double-J ureteral stent. 2. Increased gaseous distension of the stomach and bowel diffusely throughout the abdomen since 11/28/2021. But the pattern favors ileus over mechanical obstruction at this time. Electronically Signed   By: Genevie Ann M.D.   On: 11/30/2021 09:43   DG C-Arm 1-60 Min-No Report  Result Date: 11/29/2021 Fluoroscopy was utilized by the requesting physician.  No radiographic interpretation.    Anti-infectives: Anti-infectives (From admission, onward)    Start     Dose/Rate Route Frequency Ordered Stop   11/29/21 1200  ceFEPIme (MAXIPIME) 2 g in sodium chloride 0.9 % 100 mL IVPB        2 g 200 mL/hr  over 30 Minutes Intravenous Every 12 hours 11/29/21 0319     11/29/21 0130  ceFEPIme (MAXIPIME) 2 g in sodium chloride 0.9 % 100 mL IVPB        2 g 200 mL/hr over 30 Minutes Intravenous  Once 11/29/21 0116 11/29/21 0328       Assessment/Plan: 77M with ureteral stones, s/p stenting Pt with chronic opioid use and chronic constipation Ileus  resolving -ADv to soft diet -no need for NGT at this time.  LOS: 2 days    Ralene Ok 12/01/2021

## 2021-12-01 NOTE — Progress Notes (Signed)
Mobility Specialist - Progress Note   12/01/21 1129  Mobility  Activity Ambulated with assistance in hallway  Level of Assistance Standby assist, set-up cues, supervision of patient - no hands on  Assistive Device Front wheel walker  Distance Ambulated (ft) 100 ft  Range of Motion/Exercises Active  Activity Response Tolerated well  Mobility Referral Yes  $Mobility charge 1 Mobility   Pt was found in bed and agreeable to ambulate. Had no complaints during session. At EOS returned to recliner chair with all necessities in reach and chair alarm on.  Ferd Hibbs Mobility Specialist

## 2021-12-01 NOTE — TOC Initial Note (Addendum)
Transition of Care [facility]) - Initial/Assessment Note    Patient Details  Name: Luke Foster MRN: [id number] Date of Birth: 20-Mar-1950  Transition of Care [facility]) CM/SW Contact:    Roseanne Kaufman, RN Phone Number: 12/01/2021, 3:15 PM  Clinical Narrative:       Spoke with patient and patient's brother Luke Foster [facility]) would like to resume [facility] services with [facility]. Notified Cory with [facility] that patient wants to resume, awaiting response. Patient's POA request PT re-evaluate due to patient has dementia. Notified PT to indicate recommendation prior to patient declining due PT eval.            Confirmed with Cindie at [facility] that HHPT/OT will resume post discharge. Notified MD need HHPT/OT orders, awaiting HH orders.  - 4:30p MD placed HHPT/OT orders. No additional TOC needs.  Expected Discharge Plan: [place] Barriers to Discharge: Continued Medical Work up   Patient Goals and CMS Choice Patient states their goals for this hospitalization and ongoing recovery are:: return home CMS Medicare.gov Compare Post Acute Care list provided to:: Patient Choice offered to / list presented to : Patient  Expected Discharge Plan and Services Expected Discharge Plan: [place] In-house Referral: NA Discharge Planning Services: CM Consult Post Acute Care Choice: [place] arrangements for the past 2 months: Single Family Home Expected Discharge Date: 12/01/21               DME Arranged: N/A DME Agency: NA       HH Arranged: PT, OT          Prior Living Arrangements/Services Living arrangements for the past 2 months: Single Family Home Lives with:: Self Patient language and need for interpreter reviewed:: Yes Do you feel safe going back to the place where you live?: Yes      Need for Family Participation in Patient Care: No (Comment) Care giver support system in place?: Yes (comment) Current home services: Home RN Criminal  Activity/Legal Involvement Pertinent to Current Situation/Hospitalization: No - Comment as needed  Activities of Daily Living Home Assistive Devices/Equipment: Walker (specify type) ADL Screening (condition at time of admission) Patient's cognitive ability adequate to safely complete daily activities?: Yes Is the patient deaf or have difficulty hearing?: No Does the patient have difficulty seeing, even when wearing glasses/contacts?: No Does the patient have difficulty concentrating, remembering, or making decisions?: Yes Patient able to express need for assistance with ADLs?: Yes Does the patient have difficulty dressing or bathing?: No Independently performs ADLs?: Yes (appropriate for developmental age) Does the patient have difficulty walking or climbing stairs?: Yes Weakness of Legs: Both Weakness of Arms/Hands: None  Permission Sought/Granted Permission sought to share information with : Case Manager Permission granted to share information with : Yes, Verbal Permission Granted  Share Information with NAME: Case Manager           Emotional Assessment Appearance:: Appears stated age Attitude/Demeanor/Rapport: Gracious Affect (typically observed): Accepting Orientation: : Oriented to Self, Oriented to Place, Oriented to  Time Alcohol / Substance Use: Not Applicable Psych Involvement: No (comment)  Admission diagnosis:  Tachycardia [R00.0] Obstructive uropathy [N13.9] Partial small bowel obstruction (HCC) [K56.600] AKI (acute kidney injury) ([place]) [N17.9] Ureteral stone with hydronephrosis [N13.2] Patient Active Problem List   Diagnosis Date Noted   Intractable nausea and vomiting 11/30/2021   Ileus ([place]) 11/30/2021   Obstructive uropathy 11/29/2021   AKI (acute kidney injury) ([place]) 11/29/2021   Generalized weakness 04/30/2020  Constipation 04/30/2020   Nephrolithiasis 04/30/2020   Pressure injury of skin 04/30/2020   Acute urinary retention 04/29/2020   Pain in limb  82/64/1583   Acute metabolic encephalopathy 09/40/7680   Bilateral hydronephrosis 03/07/2019   Altered mental status 03/07/2019   Hypertension    Abnormal ECG 02/28/2019   Atherosclerosis of abdominal aorta ([place]) 02/28/2019   Benign essential HTN 02/28/2019   Hyperlipidemia, mixed 02/28/2019   Neuropathy 01/21/2017   Pain in right foot 01/21/2017   Pain in left foot 01/21/2017   Chronic pain syndrome 01/21/2017   Post laminectomy syndrome 01/21/2017   Status post insertion of spinal cord stimulator 88/12/313   Uncomplicated opioid dependence ([place]) 01/21/2017   Chronic bilateral low back pain without sciatica 11/27/2015   Degenerative disc disease, lumbar 06/17/2015   Encounter for therapeutic drug monitoring 06/17/2015   Nervous system device, implant, or graft complication, initial encounter 04/06/2014   Lumbosacral radiculopathy 12/09/2012   PCP:  [name], MD Pharmacy:   [place], [place] - [address] [place] [address] Phone: [phone] Fax: [phone]  [id number] [place] [address]: [phone]: [phone] [place] [address]: [phone]: [phone]    Social Determinants of Health (SDOH) Interventions    Readmission Risk Interventions    12/01/2021    3:11 PM  Readmission Risk Prevention Plan  Transportation Screening Complete  PCP or Specialist Appt within 5-7 Days Complete  Home Care Screening Complete  Medication Review (RN CM) Complete

## 2021-12-05 DIAGNOSIS — R338 Other retention of urine: Secondary | ICD-10-CM | POA: Diagnosis not present

## 2021-12-05 DIAGNOSIS — M5136 Other intervertebral disc degeneration, lumbar region: Secondary | ICD-10-CM | POA: Diagnosis not present

## 2021-12-05 DIAGNOSIS — G629 Polyneuropathy, unspecified: Secondary | ICD-10-CM | POA: Diagnosis not present

## 2021-12-06 ENCOUNTER — Emergency Department (HOSPITAL_COMMUNITY)
Admission: EM | Admit: 2021-12-06 | Discharge: 2021-12-06 | Disposition: A | Discharge status: Home / Self Care | Payer: Medicare Other | Attending: Emergency Medicine | Admitting: Emergency Medicine

## 2021-12-06 ENCOUNTER — Other Ambulatory Visit: Payer: Self-pay

## 2021-12-06 ENCOUNTER — Encounter (HOSPITAL_COMMUNITY): Payer: Self-pay

## 2021-12-06 DIAGNOSIS — T83098A Other mechanical complication of other indwelling urethral catheter, initial encounter: Secondary | ICD-10-CM | POA: Insufficient documentation

## 2021-12-06 DIAGNOSIS — R1030 Lower abdominal pain, unspecified: Secondary | ICD-10-CM | POA: Insufficient documentation

## 2021-12-06 DIAGNOSIS — T83091A Other mechanical complication of indwelling urethral catheter, initial encounter: Secondary | ICD-10-CM

## 2021-12-06 DIAGNOSIS — R6889 Other general symptoms and signs: Secondary | ICD-10-CM | POA: Diagnosis not present

## 2021-12-06 DIAGNOSIS — N200 Calculus of kidney: Secondary | ICD-10-CM | POA: Diagnosis not present

## 2021-12-06 DIAGNOSIS — Z743 Need for continuous supervision: Secondary | ICD-10-CM | POA: Diagnosis not present

## 2021-12-06 NOTE — ED Provider Notes (Signed)
[facility] EMERGENCY DEPARTMENT Provider Note   CSN: [id number] Arrival date & time: 12/06/21  3614     History  Chief Complaint  Patient presents with   foley obstruction     Luke Foster is a 71 y.o. male.  Patient states his foley balloon deflated and cahteter fell out. Had some suprapubic pain prior to my evaluation and foley replaced with >700 output. Patient asymptomatic at this time. No other needs identified.         Home Medications Prior to Admission medications   Medication Sig Start Date End Date Taking? Authorizing Provider  acetaminophen (TYLENOL) 500 MG tablet Take 1,000 mg by mouth every 6 (six) hours as needed for mild pain or moderate pain.    [provider]  ascorbic acid (VITAMIN C) 500 MG tablet Take 500 mg by mouth daily.    [provider]  cholecalciferol (VITAMIN D3) 25 MCG (1000 UNIT) tablet Take 2 tablets (2,000 Units total) by mouth daily. 12/01/21   Rai, Vernelle Emerald, MD  ciprofloxacin (CIPRO) 500 MG tablet Take 1 tablet (500 mg total) by mouth 2 (two) times daily for 7 days. One po bid x 7 days 12/01/21 12/08/21  Rai, Vernelle Emerald, MD  cyanocobalamin (VITAMIN B12) 500 MCG tablet Take 2 tablets (1,000 mcg total) by mouth 2 (two) times daily. 12/01/21   Rai, Vernelle Emerald, MD  diltiazem (CARDIZEM CD) 120 MG 24 hr capsule Take 1 capsule (120 mg total) by mouth every evening. 05/17/20   Medina-Vargas, Monina C, NP  docusate sodium (COLACE) 100 MG capsule Take 1 capsule (100 mg total) by mouth in the morning, at noon, and at bedtime. Patient taking differently: Take 100-300 mg by mouth at bedtime. 05/17/20   Medina-Vargas, Monina C, NP  linaclotide (LINZESS) 145 MCG CAPS capsule Take 1 capsule (145 mcg total) by mouth daily before breakfast. 12/02/21   Rai, Ripudeep K, MD  mirabegron ER (MYRBETRIQ) 50 MG TB24 tablet Take 1 tablet (50 mg total) by mouth daily. 05/17/20   Medina-Vargas, Monina C, NP  nitrofurantoin, macrocrystal-monohydrate,  (MACROBID) 100 MG capsule Take 1 capsule (100 mg total) by mouth at bedtime. Start on 12/08/21 12/08/21 01/07/22  Rai, Vernelle Emerald, MD  ondansetron (ZOFRAN) 4 MG tablet Take 1 tablet (4 mg total) by mouth every 8 (eight) hours as needed for nausea or vomiting. 12/01/21   Rai, Ripudeep Raliegh Ip, MD  oxyCODONE (ROXICODONE) 15 MG immediate release tablet Take 1 tablet (15 mg total) by mouth every 6 (six) hours as needed for pain. 05/17/20   Medina-Vargas, Monina C, NP  polyethylene glycol (MIRALAX / GLYCOLAX) 17 g packet Take 17 g by mouth 2 (two) times daily. Patient taking differently: Take 17 g by mouth daily as needed for mild constipation or moderate constipation. 05/17/20   Medina-Vargas, Monina C, NP  rOPINIRole (REQUIP) 0.5 MG tablet Take 0.5 mg by mouth at bedtime as needed (restless legs). 09/22/21   [provider]  solifenacin (VESICARE) 5 MG tablet Take 5 mg by mouth daily. 07/17/21   [provider]  tamsulosin (FLOMAX) 0.4 MG CAPS capsule Take 1 capsule (0.4 mg total) by mouth every evening. 05/17/20   Medina-Vargas, Monina C, NP  venlafaxine XR (EFFEXOR-XR) 75 MG 24 hr capsule Take 1 capsule (75 mg total) by mouth in the morning and at bedtime. 05/17/20   Medina-Vargas, Monina C, NP      Allergies    Erythromycin    Review of Systems   Review of  Systems  Physical Exam Updated Vital Signs BP (!) 142/86   Foster (!) 108   Temp 98.9 F (37.2 C) (Oral)   Resp 20   SpO2 100%  Physical Exam Vitals and nursing note reviewed.  Constitutional:      Appearance: He is well-developed.  HENT:     Head: Normocephalic and atraumatic.  Cardiovascular:     Rate and Rhythm: Normal rate.  Pulmonary:     Effort: Pulmonary effort is normal. No respiratory distress.  Abdominal:     General: There is no distension.  Musculoskeletal:        General: Normal range of motion.     Cervical back: Normal range of motion.  Neurological:     Mental Status: He is alert.     ED Results /  Procedures / Treatments   Labs (all labs ordered are listed, but only abnormal results are displayed) Labs Reviewed - No data to display  EKG None  Radiology No results found.  Procedures Procedures    Medications Ordered in ED Medications - No data to display  ED Course/ Medical Decision Making/ A&P                           Medical Decision Making  Foley replaced by nursing prior to my arrival and patient feels better. No complaints. Draining well. Already on antibiotics for UTI from recently. Already has urology follow up. No indication for labs/imaging at this time.  Final Clinical Impression(s) / ED Diagnoses Final diagnoses:  Obstruction of Foley catheter, initial encounter [facility])    Rx / [place] Orders ED Discharge Orders     None         Dandre Sisler, Corene Cornea, MD 12/06/21 [phone]

## 2021-12-06 NOTE — ED Triage Notes (Signed)
Pt arrived from home via [facility] w c/o foley obstruction last known drainage at 1500. Bladder scanner showed 710

## 2021-12-08 ENCOUNTER — Encounter (INDEPENDENT_AMBULATORY_CARE_PROVIDER_SITE_OTHER): Payer: Self-pay

## 2021-12-09 DIAGNOSIS — B9689 Other specified bacterial agents as the cause of diseases classified elsewhere: Secondary | ICD-10-CM | POA: Diagnosis not present

## 2021-12-09 DIAGNOSIS — I10 Essential (primary) hypertension: Secondary | ICD-10-CM | POA: Diagnosis not present

## 2021-12-09 DIAGNOSIS — N319 Neuromuscular dysfunction of bladder, unspecified: Secondary | ICD-10-CM | POA: Diagnosis not present

## 2021-12-09 DIAGNOSIS — Z466 Encounter for fitting and adjustment of urinary device: Secondary | ICD-10-CM | POA: Diagnosis not present

## 2021-12-09 DIAGNOSIS — N136 Pyonephrosis: Secondary | ICD-10-CM | POA: Diagnosis not present

## 2021-12-10 DIAGNOSIS — B9689 Other specified bacterial agents as the cause of diseases classified elsewhere: Secondary | ICD-10-CM | POA: Diagnosis not present

## 2021-12-10 DIAGNOSIS — N319 Neuromuscular dysfunction of bladder, unspecified: Secondary | ICD-10-CM | POA: Diagnosis not present

## 2021-12-10 DIAGNOSIS — Z466 Encounter for fitting and adjustment of urinary device: Secondary | ICD-10-CM | POA: Diagnosis not present

## 2021-12-10 DIAGNOSIS — N136 Pyonephrosis: Secondary | ICD-10-CM | POA: Diagnosis not present

## 2021-12-11 ENCOUNTER — Other Ambulatory Visit: Payer: Self-pay | Admitting: Urology

## 2021-12-11 DIAGNOSIS — Z466 Encounter for fitting and adjustment of urinary device: Secondary | ICD-10-CM | POA: Diagnosis not present

## 2021-12-11 DIAGNOSIS — N319 Neuromuscular dysfunction of bladder, unspecified: Secondary | ICD-10-CM | POA: Diagnosis not present

## 2021-12-11 DIAGNOSIS — B9689 Other specified bacterial agents as the cause of diseases classified elsewhere: Secondary | ICD-10-CM | POA: Diagnosis not present

## 2021-12-11 DIAGNOSIS — N136 Pyonephrosis: Secondary | ICD-10-CM | POA: Diagnosis not present

## 2021-12-12 DIAGNOSIS — Z466 Encounter for fitting and adjustment of urinary device: Secondary | ICD-10-CM | POA: Diagnosis not present

## 2021-12-12 DIAGNOSIS — B9689 Other specified bacterial agents as the cause of diseases classified elsewhere: Secondary | ICD-10-CM | POA: Diagnosis not present

## 2021-12-12 DIAGNOSIS — N319 Neuromuscular dysfunction of bladder, unspecified: Secondary | ICD-10-CM | POA: Diagnosis not present

## 2021-12-12 DIAGNOSIS — N136 Pyonephrosis: Secondary | ICD-10-CM | POA: Diagnosis not present

## 2021-12-16 DIAGNOSIS — N136 Pyonephrosis: Secondary | ICD-10-CM | POA: Diagnosis not present

## 2021-12-16 DIAGNOSIS — Z466 Encounter for fitting and adjustment of urinary device: Secondary | ICD-10-CM | POA: Diagnosis not present

## 2021-12-16 DIAGNOSIS — B9689 Other specified bacterial agents as the cause of diseases classified elsewhere: Secondary | ICD-10-CM | POA: Diagnosis not present

## 2021-12-16 DIAGNOSIS — N319 Neuromuscular dysfunction of bladder, unspecified: Secondary | ICD-10-CM | POA: Diagnosis not present

## 2021-12-19 DIAGNOSIS — N136 Pyonephrosis: Secondary | ICD-10-CM | POA: Diagnosis not present

## 2021-12-19 DIAGNOSIS — Z466 Encounter for fitting and adjustment of urinary device: Secondary | ICD-10-CM | POA: Diagnosis not present

## 2021-12-19 DIAGNOSIS — B9689 Other specified bacterial agents as the cause of diseases classified elsewhere: Secondary | ICD-10-CM | POA: Diagnosis not present

## 2021-12-19 DIAGNOSIS — N319 Neuromuscular dysfunction of bladder, unspecified: Secondary | ICD-10-CM | POA: Diagnosis not present

## 2021-12-23 DIAGNOSIS — N319 Neuromuscular dysfunction of bladder, unspecified: Secondary | ICD-10-CM | POA: Diagnosis not present

## 2021-12-23 DIAGNOSIS — B9689 Other specified bacterial agents as the cause of diseases classified elsewhere: Secondary | ICD-10-CM | POA: Diagnosis not present

## 2021-12-23 DIAGNOSIS — N136 Pyonephrosis: Secondary | ICD-10-CM | POA: Diagnosis not present

## 2021-12-24 DIAGNOSIS — N136 Pyonephrosis: Secondary | ICD-10-CM | POA: Diagnosis not present

## 2021-12-24 DIAGNOSIS — B9689 Other specified bacterial agents as the cause of diseases classified elsewhere: Secondary | ICD-10-CM | POA: Diagnosis not present

## 2021-12-24 DIAGNOSIS — N319 Neuromuscular dysfunction of bladder, unspecified: Secondary | ICD-10-CM | POA: Diagnosis not present

## 2021-12-24 DIAGNOSIS — Z466 Encounter for fitting and adjustment of urinary device: Secondary | ICD-10-CM | POA: Diagnosis not present

## 2021-12-25 DIAGNOSIS — N136 Pyonephrosis: Secondary | ICD-10-CM | POA: Diagnosis not present

## 2021-12-25 DIAGNOSIS — B9689 Other specified bacterial agents as the cause of diseases classified elsewhere: Secondary | ICD-10-CM | POA: Diagnosis not present

## 2021-12-25 DIAGNOSIS — N319 Neuromuscular dysfunction of bladder, unspecified: Secondary | ICD-10-CM | POA: Diagnosis not present

## 2021-12-25 DIAGNOSIS — Z466 Encounter for fitting and adjustment of urinary device: Secondary | ICD-10-CM | POA: Diagnosis not present

## 2021-12-27 NOTE — Patient Instructions (Signed)
SURGICAL WAITING ROOM VISITATION Patients having surgery or a procedure may have no more than 2 support people in the waiting area - these visitors may rotate in the visitor waiting room.   Children under the age of 42 must have an adult with them who is not the patient. If the patient needs to stay at the hospital during part of their recovery, the visitor guidelines for inpatient rooms apply.  PRE-OP VISITATION  Pre-op nurse will coordinate an appropriate time for 1 support person to accompany the patient in pre-op.  This support person may not rotate.  This visitor will be contacted when the time is appropriate for the visitor to come back in the pre-op area.  Please refer to the [facility] website for the visitor guidelines for Inpatients (after your surgery is over and you are in a regular room).  You are not required to quarantine at this time prior to your surgery. However, you must do this: Hand Hygiene often Do NOT share personal items Notify your provider if you are in close contact with someone who has COVID or you develop fever 100.4 or greater, new onset of sneezing, cough, sore throat, shortness of breath or body aches.  If you test positive for Covid or have been in contact with anyone that has tested positive in the last 10 days please notify you surgeon.    Your procedure is scheduled on:  Monday January 05, 2022  Report to [facility] Main Entrance: [place] entrance where the [employer] is available.   Report to admitting at: 08:15   AM  +++++Call this number if you have any questions or problems the morning of surgery [phone]  DO NOT EAT OR DRINK ANYTHING AFTER MIDNIGHT THE NIGHT PRIOR TO YOUR SURGERY / PROCEDURE.   FOLLOW BOWEL PREP AND ANY ADDITIONAL PRE OP INSTRUCTIONS YOU RECEIVED FROM YOUR SURGEON'S OFFICE!!!   Oral Hygiene is also important to reduce your risk of infection.        Remember - BRUSH YOUR TEETH THE MORNING OF SURGERY WITH YOUR  REGULAR TOOTHPASTE  Do NOT smoke after Midnight the night before surgery.  Take ONLY these medicines the morning of surgery with A SIP OF WATER: venlafaxine (Effexor), Mirabegron (Mybetriq) ?am, Solifenacin (Vesicare), ?am. You may take Tylenol and Zofran if needed   If You have been diagnosed with Sleep Apnea - Bring CPAP mask and tubing day of surgery. We will provide you with a CPAP machine on the day of your surgery.                   You may not have any metal on your body including  jewelry, and body piercing  Do not wear  lotions, powders, cologne, or deodorant  Men may shave face and neck.  Contacts, Hearing Aids, dentures or bridgework may not be worn into surgery.   Patients discharged on the day of surgery will not be allowed to drive home.  Someone NEEDS to stay with you for the first 24 hours after anesthesia.  Do not bring your home medications to the hospital. The Pharmacy will dispense medications listed on your medication list to you during your admission in the Hospital.   Please read over the following fact sheets you were given: IF YOU HAVE QUESTIONS ABOUT YOUR PRE-OP INSTRUCTIONS, PLEASE CALL [phone]  ([place])   [place] - Preparing for Surgery Before surgery, you can play an important role.  Because skin is not sterile, your  skin needs to be as free of germs as possible.  You can reduce the number of germs on your skin by washing with CHG (chlorahexidine gluconate) soap before surgery.  CHG is an antiseptic cleaner which kills germs and bonds with the skin to continue killing germs even after washing. Please DO NOT use if you have an allergy to CHG or antibacterial soaps.  If your skin becomes reddened/irritated stop using the CHG and inform your nurse when you arrive at Short Stay. Do not shave (including legs and underarms) for at least 48 hours prior to the first CHG shower.  You may shave your face/neck.  Please follow these instructions carefully:  1.   Shower with CHG Soap the night before surgery and the  morning of surgery.  2.  If you choose to wash your hair, wash your hair first as usual with your normal  shampoo.  3.  After you shampoo, rinse your hair and body thoroughly to remove the shampoo.                             4.  Use CHG as you would any other liquid soap.  You can apply chg directly to the skin and wash.  Gently with a scrungie or clean washcloth.  5.  Apply the CHG Soap to your body ONLY FROM THE NECK DOWN.   Do not use on face/ open                           Wound or open sores. Avoid contact with eyes, ears mouth and genitals (private parts).                       Wash face,  Genitals (private parts) with your normal soap.             6.  Wash thoroughly, paying special attention to the area where your  surgery  will be performed.  7.  Thoroughly rinse your body with warm water from the neck down.  8.  DO NOT shower/wash with your normal soap after using and rinsing off the CHG Soap.            9.  Pat yourself dry with a clean towel.            10.  Wear clean pajamas.            11.  Place clean sheets on your bed the night of your first shower and do not  sleep with pets.  ON THE DAY OF SURGERY : Do not apply any lotions/deodorants the morning of surgery.  Please wear clean clothes to the hospital/surgery center.    FAILURE TO FOLLOW THESE INSTRUCTIONS MAY RESULT IN THE CANCELLATION OF YOUR SURGERY  PATIENT SIGNATURE_________________________________  NURSE SIGNATURE__________________________________  ________________________________________________________________________

## 2021-12-27 NOTE — Progress Notes (Signed)
COVID Vaccine received:  _0  No _1  Yes Date of any COVID positive Test in last 90 days:  PCP - [name], MD Cardiologist - None  Chest x-ray [phone] Epic EKG -  12-03-2021 Epic Stress Test -  ECHO -04-10-2019 Epic  Cardiac Cath -   PCR screen: _2  Ordered & Completed                      _3   No Order but Needs PROFEND                      _4   N/A for this surgery  Surgery Plan:  _5  Ambulatory                            _6  Outpatient in bed                            _7  Admit  Anesthesia:    _8  General  _9  Spinal                           _10   Choice _11   MAC  Bowel Prep - _12  No  _13   Yes _____________  Pacemaker / ICD device _14  No _15  Yes        Device order form faxed _16  No    _17   Yes      Faxed to:  Spinal Cord Stimulator:_18  No _19  Yes     Had a stimulator but it has been removed.   History of Sleep Apnea? _20  No _21  Yes   CPAP used?- _22  No _23  Yes    Does the patient monitor blood sugar? _24  No _25  Yes  _26  N/A  Blood Thinner / Instructions:none Aspirin Instructions:  ERAS Protocol Ordered: _27  No  _28  Yes  Patient is to be NPO after: Midnight prior  Comments:   Activity level: Patient can / can not climb a flight of stairs without difficulty; _29  No CP  _30  No SOB, but would have ______   Patient can / can not perform ADLs without assistance.   Anesthesia review: NTN, OSA(CPAP), Chronic pain syndrome  Patient denies shortness of breath, fever, cough and chest pain at PAT appointment.  Patient verbalized understanding and agreement to the Pre-Surgical Instructions that were given to them at this PAT appointment. Patient was also educated of the need to review these PAT instructions again prior to his/her surgery.I reviewed the appropriate phone numbers to call if they have any and questions or concerns.

## 2021-12-29 ENCOUNTER — Encounter (HOSPITAL_COMMUNITY)
Admission: RE | Admit: 2021-12-29 | Discharge: 2021-12-29 | Disposition: A | Discharge status: Home / Self Care | Payer: Medicare Other | Source: Ambulatory Visit | Attending: Urology | Admitting: Urology

## 2021-12-29 ENCOUNTER — Encounter (HOSPITAL_COMMUNITY): Payer: Self-pay

## 2021-12-29 ENCOUNTER — Other Ambulatory Visit: Payer: Self-pay

## 2021-12-29 VITALS — BP 111/75 | HR 78 | Temp 98.8°F | Resp 16 | Ht 70.0 in | Wt 210.0 lb

## 2021-12-29 DIAGNOSIS — I10 Essential (primary) hypertension: Secondary | ICD-10-CM | POA: Diagnosis not present

## 2021-12-29 DIAGNOSIS — Z01812 Encounter for preprocedural laboratory examination: Secondary | ICD-10-CM | POA: Diagnosis not present

## 2021-12-29 LAB — CBC
HCT: 38.1 % — ABNORMAL LOW (ref 39.0–52.0)
Hemoglobin: 12 g/dL — ABNORMAL LOW (ref 13.0–17.0)
MCH: 29.6 pg (ref 26.0–34.0)
MCHC: 31.5 g/dL (ref 30.0–36.0)
MCV: 94.1 fL (ref 80.0–100.0)
Platelets: 360 10*3/uL (ref 150–400)
RBC: 4.05 MIL/uL — ABNORMAL LOW (ref 4.22–5.81)
RDW: 12.6 % (ref 11.5–15.5)
WBC: 9.4 10*3/uL (ref 4.0–10.5)
nRBC: 0 % (ref 0.0–0.2)

## 2021-12-29 LAB — BASIC METABOLIC PANEL
Anion gap: 11 (ref 5–15)
BUN: 26 mg/dL — ABNORMAL HIGH (ref 8–23)
CO2: 24 mmol/L (ref 22–32)
Calcium: 10 mg/dL (ref 8.9–10.3)
Chloride: 107 mmol/L (ref 98–111)
Creatinine, Ser: 1.11 mg/dL (ref 0.61–1.24)
GFR, Estimated: >60 mL/min (ref >60)
Glucose, Bld: 91 mg/dL (ref 70–99)
Potassium: 3.8 mmol/L (ref 3.5–5.1)
Sodium: 142 mmol/L (ref 135–145)

## 2021-12-30 DIAGNOSIS — N319 Neuromuscular dysfunction of bladder, unspecified: Secondary | ICD-10-CM | POA: Diagnosis not present

## 2021-12-30 DIAGNOSIS — Z466 Encounter for fitting and adjustment of urinary device: Secondary | ICD-10-CM | POA: Diagnosis not present

## 2021-12-30 DIAGNOSIS — B9689 Other specified bacterial agents as the cause of diseases classified elsewhere: Secondary | ICD-10-CM | POA: Diagnosis not present

## 2021-12-30 DIAGNOSIS — N136 Pyonephrosis: Secondary | ICD-10-CM | POA: Diagnosis not present

## 2021-12-31 DIAGNOSIS — N319 Neuromuscular dysfunction of bladder, unspecified: Secondary | ICD-10-CM | POA: Diagnosis not present

## 2021-12-31 DIAGNOSIS — N136 Pyonephrosis: Secondary | ICD-10-CM | POA: Diagnosis not present

## 2021-12-31 DIAGNOSIS — B9689 Other specified bacterial agents as the cause of diseases classified elsewhere: Secondary | ICD-10-CM | POA: Diagnosis not present

## 2021-12-31 DIAGNOSIS — Z466 Encounter for fitting and adjustment of urinary device: Secondary | ICD-10-CM | POA: Diagnosis not present

## 2022-01-02 DIAGNOSIS — N319 Neuromuscular dysfunction of bladder, unspecified: Secondary | ICD-10-CM | POA: Diagnosis not present

## 2022-01-02 DIAGNOSIS — B9689 Other specified bacterial agents as the cause of diseases classified elsewhere: Secondary | ICD-10-CM | POA: Diagnosis not present

## 2022-01-02 DIAGNOSIS — N136 Pyonephrosis: Secondary | ICD-10-CM | POA: Diagnosis not present

## 2022-01-02 DIAGNOSIS — Z466 Encounter for fitting and adjustment of urinary device: Secondary | ICD-10-CM | POA: Diagnosis not present

## 2022-01-05 ENCOUNTER — Ambulatory Visit (HOSPITAL_COMMUNITY)
Admission: RE | Admit: 2022-01-05 | Discharge: 2022-01-05 | Disposition: A | Discharge status: Home / Self Care | Payer: Medicare Other | Attending: Urology | Admitting: Urology

## 2022-01-05 ENCOUNTER — Ambulatory Visit (HOSPITAL_COMMUNITY): Payer: Medicare Other

## 2022-01-05 ENCOUNTER — Encounter (HOSPITAL_COMMUNITY)
Admission: RE | Disposition: A | Discharge status: Home / Self Care | Payer: Self-pay | Source: Home / Self Care | Attending: Urology

## 2022-01-05 ENCOUNTER — Ambulatory Visit (HOSPITAL_BASED_OUTPATIENT_CLINIC_OR_DEPARTMENT_OTHER): Payer: Medicare Other | Admitting: Certified Registered Nurse Anesthetist

## 2022-01-05 ENCOUNTER — Encounter (HOSPITAL_COMMUNITY): Payer: Self-pay | Admitting: Urology

## 2022-01-05 ENCOUNTER — Ambulatory Visit (HOSPITAL_COMMUNITY): Payer: Medicare Other | Admitting: Certified Registered Nurse Anesthetist

## 2022-01-05 DIAGNOSIS — N202 Calculus of kidney with calculus of ureter: Secondary | ICD-10-CM | POA: Diagnosis not present

## 2022-01-05 DIAGNOSIS — Z87891 Personal history of nicotine dependence: Secondary | ICD-10-CM

## 2022-01-05 DIAGNOSIS — I10 Essential (primary) hypertension: Secondary | ICD-10-CM

## 2022-01-05 DIAGNOSIS — G4730 Sleep apnea, unspecified: Secondary | ICD-10-CM

## 2022-01-05 DIAGNOSIS — Z87440 Personal history of urinary (tract) infections: Secondary | ICD-10-CM | POA: Diagnosis not present

## 2022-01-05 HISTORY — PX: CYSTOSCOPY/URETEROSCOPY/HOLMIUM LASER/STENT PLACEMENT: SHX6546

## 2022-01-05 SURGERY — CYSTOSCOPY/URETEROSCOPY/HOLMIUM LASER/STENT PLACEMENT
Anesthesia: General | Site: Ureter | Laterality: Right

## 2022-01-05 MED ORDER — CIPROFLOXACIN HCL 500 MG PO TABS: 500.0000 mg | ORAL_TABLET | Freq: Two times a day (BID) | ORAL | 0 refills | Status: AC

## 2022-01-05 MED ORDER — PROPOFOL 10 MG/ML IV BOLUS
INTRAVENOUS | Status: AC
  Filled 2022-01-05: qty 20

## 2022-01-05 MED ORDER — CHLORHEXIDINE GLUCONATE 0.12 % MT SOLN
15.0000 mL | Freq: Once | OROMUCOSAL | Status: AC
  Administered 2022-01-05: 15 mL via OROMUCOSAL

## 2022-01-05 MED ORDER — ONDANSETRON HCL 4 MG/2ML IJ SOLN
INTRAMUSCULAR | Status: AC
  Filled 2022-01-05: qty 2

## 2022-01-05 MED ORDER — CIPROFLOXACIN IN D5W 400 MG/200ML IV SOLN
400.0000 mg | INTRAVENOUS | Status: AC
  Administered 2022-01-05: 400 mg via INTRAVENOUS
  Filled 2022-01-05: qty 200

## 2022-01-05 MED ORDER — IOHEXOL 300 MG/ML  SOLN
INTRAMUSCULAR | Status: DC | PRN
  Administered 2022-01-05: 30 mL

## 2022-01-05 MED ORDER — LIDOCAINE HCL (PF) 2 % IJ SOLN
INTRAMUSCULAR | Status: AC
  Filled 2022-01-05: qty 5

## 2022-01-05 MED ORDER — LACTATED RINGERS IV SOLN: INTRAVENOUS | Status: DC

## 2022-01-05 MED ORDER — PHENYLEPHRINE HCL-NACL 20-0.9 MG/250ML-% IV SOLN
INTRAVENOUS | Status: DC | PRN
  Administered 2022-01-05: 35 ug/min via INTRAVENOUS

## 2022-01-05 MED ORDER — SODIUM CHLORIDE 0.9 % IR SOLN
Status: DC | PRN
  Administered 2022-01-05 (×2): 3000 mL

## 2022-01-05 MED ORDER — PHENYLEPHRINE 80 MCG/ML (10ML) SYRINGE FOR IV PUSH (FOR BLOOD PRESSURE SUPPORT)
PREFILLED_SYRINGE | INTRAVENOUS | Status: DC | PRN
  Administered 2022-01-05 (×2): 80 ug via INTRAVENOUS

## 2022-01-05 MED ORDER — FENTANYL CITRATE (PF) 100 MCG/2ML IJ SOLN
INTRAMUSCULAR | Status: AC
  Filled 2022-01-05: qty 2

## 2022-01-05 MED ORDER — ONDANSETRON HCL 4 MG/2ML IJ SOLN
INTRAMUSCULAR | Status: DC | PRN
  Administered 2022-01-05: 4 mg via INTRAVENOUS

## 2022-01-05 MED ORDER — PROPOFOL 10 MG/ML IV BOLUS
INTRAVENOUS | Status: DC | PRN
  Administered 2022-01-05: 200 mg via INTRAVENOUS

## 2022-01-05 MED ORDER — LIDOCAINE 2% (20 MG/ML) 5 ML SYRINGE
INTRAMUSCULAR | Status: DC | PRN
  Administered 2022-01-05: 100 mg via INTRAVENOUS

## 2022-01-05 MED ORDER — ORAL CARE MOUTH RINSE: 15.0000 mL | Freq: Once | OROMUCOSAL | Status: AC

## 2022-01-05 MED ORDER — FENTANYL CITRATE PF 50 MCG/ML IJ SOSY: 25.0000 ug | PREFILLED_SYRINGE | INTRAMUSCULAR | Status: DC | PRN

## 2022-01-05 MED ORDER — FENTANYL CITRATE (PF) 100 MCG/2ML IJ SOLN
INTRAMUSCULAR | Status: DC | PRN
  Administered 2022-01-05: 25 ug via INTRAVENOUS
  Administered 2022-01-05: 50 ug via INTRAVENOUS
  Administered 2022-01-05: 25 ug via INTRAVENOUS

## 2022-01-05 SURGICAL SUPPLY — 30 items
BAG DRN RND TRDRP ANRFLXCHMBR (UROLOGICAL SUPPLIES) ×1
BAG URINE DRAIN 2000ML AR STRL (UROLOGICAL SUPPLIES) IMPLANT
BAG URO CATCHER STRL LF (MISCELLANEOUS) ×1 IMPLANT
BASKET LASER NITINOL 1.9FR (BASKET) IMPLANT
BASKET ZERO TIP NITINOL 2.4FR (BASKET) IMPLANT
BSKT STON RTRVL 120 1.9FR (BASKET)
BSKT STON RTRVL ZERO TP 2.4FR (BASKET) ×1
CATH FOLEY 2WAY SLVR  5CC 20FR (CATHETERS) ×1
CATH FOLEY 2WAY SLVR 5CC 20FR (CATHETERS) IMPLANT
CATH URETERAL DUAL LUMEN 10F (MISCELLANEOUS) IMPLANT
CATH URETL OPEN END 6FR 70 (CATHETERS) ×1 IMPLANT
CLOTH BEACON ORANGE TIMEOUT ST (SAFETY) ×1 IMPLANT
EXTRACTOR STONE 1.7FRX115CM (UROLOGICAL SUPPLIES) IMPLANT
GLOVE BIO SURGEON STRL SZ7.5 (GLOVE) ×1 IMPLANT
GOWN STRL REUS W/ TWL XL LVL3 (GOWN DISPOSABLE) ×1 IMPLANT
GOWN STRL REUS W/TWL XL LVL3 (GOWN DISPOSABLE) ×1
GUIDEWIRE ANG ZIPWIRE 038X150 (WIRE) IMPLANT
GUIDEWIRE STR DUAL SENSOR (WIRE) ×1 IMPLANT
KIT TURNOVER KIT A (KITS) IMPLANT
LASER FIB FLEXIVA PULSE ID 365 (Laser) IMPLANT
MANIFOLD NEPTUNE II (INSTRUMENTS) ×1 IMPLANT
PACK CYSTO (CUSTOM PROCEDURE TRAY) ×1 IMPLANT
SHEATH NAVIGATOR HD 11/13X28 (SHEATH) IMPLANT
SHEATH NAVIGATOR HD 11/13X36 (SHEATH) IMPLANT
SHEATH URETERAL 12FRX55CM (UROLOGICAL SUPPLIES) IMPLANT
STENT URET 6FRX26 CONTOUR (STENTS) IMPLANT
TRACTIP FLEXIVA PULS ID 200XHI (Laser) IMPLANT
TRACTIP FLEXIVA PULSE ID 200 (Laser)
TUBING CONNECTING 10 (TUBING) ×1 IMPLANT
TUBING UROLOGY SET (TUBING) ×1 IMPLANT

## 2022-01-05 NOTE — Op Note (Signed)
Operative Note  Preoperative diagnosis:  1.  Right renal and ureteral calculi  Postoperative diagnosis: 1.  Right renal and ureteral calculi  Procedure(s): 1.  Cystoscopy with right retrograde pyelogram, right ureteroscopy with stone extraction, right ureteral stent exchange  Surgeon: Link Snuffer, MD  Assistants: None  Anesthesia: General  Complications: None immediate  EBL: Minimal  Specimens: 1.  Renal calculus  Drains/Catheters: 1.  6 x 26 double-J ureteral stent 2.  Foley catheter  Intraoperative findings: 1.  Normal anterior urethra 2.  Severely obstructing prostate with large intravesical median lobe.  Some inflammatory change in the bladder consistent with chronic Foley but no tumors or masses.  No stones. 3.  Right ureteroscopy revealed the 7 mm stone which was basket extracted.  He has stones in the right kidney most of which were too small for basket extraction but a couple of the larger fragments were basket extracted.  Retrograde pyelogram at the conclusion of the case showed some fullness in the renal pelvis but no obvious hydronephrosis or filling defect after treatment of stones  Indication: 71 year old male with a history of right ureteral calculus status post urgent ureteral stent placement for UTI and AKI presents for previously mentioned operation.  Description of procedure:  The patient was identified and consent was obtained.  The patient was taken to the operating room and placed in the supine position.  The patient was placed under general anesthesia.  Perioperative antibiotics were administered.  The patient was placed in dorsal lithotomy.  Patient was prepped and draped in a standard sterile fashion and a timeout was performed.  A 21 French rigid cystoscope was advanced into the urethra and into the bladder.  Complete cystoscopy was performed with findings as noted above.  The right stent was grasped and removed.  I reintroduced the scope into the  bladder.  A sensor wire was advanced up the right ureter and into the kidney under fluoroscopic guidance.  Semirigid ureteroscopy was performed up to the stone which was basket extracted.  I reintroduced the scope into the ureter and advanced up to the renal pelvis and no other calculi were seen.  A second wire was advanced through the scope and into the kidney under fluoroscopic guidance.  Scope was withdrawn.  A 12 x 14 ureteral access sheath was advanced over the wire under continuous fluoroscopic guidance up to the renal pelvis.  Inner sheath and wire were withdrawn.  Digital ureteroscopy identified several small stones.  A couple of them were basket extracted.  The remainder of the stones were too small to basket.  I shot a retrograde pyelogram through the scope with the findings noted above.  I withdrew the scope along with the access sheath visualizing the ureter upon removal.  There were no ureteral calculi and no obvious ureteral injury was identified.  I backloaded the wire onto the rigid cystoscope and advanced that into the bladder followed by routine placement of a 6 x 26 double-J ureteral stent.  Fluoroscopy confirmed proximal placement and direct visualization confirmed a good coil within the bladder.  I drained the bladder and extracted any remaining stone from the bladder.  I removed the scope and placed a 20 French Foley catheter.  This concluded the operation.  Patient tolerated the procedure well stable postoperative.  Plan: Follow-up in about 1 week for stent removal.

## 2022-01-05 NOTE — H&P (Signed)
H&P  Chief Complaint: Right ureteral calculus  History of Present Illness: 71 year old male with a history of chronic urinary retention managed with Foley catheter.  He was recently admitted to the hospital with right ureteral calculus with AKI and UTI.  He underwent urgent right ureteral stent placement on 10/21.  He presents for definitive management of his stone.  Past Medical History:  Diagnosis Date   Arthritis    Chronic back pain    Chronic pain 1999   Bilateral feet (R >L)   Difficult or painful urination    High blood pressure    History of kidney stones    H/O   Nerve damage    Pain management    Paresthesia of foot, bilateral    Renal disorder    Sleep apnea    USES CPAP   Weakness of both legs    Past Surgical History:  Procedure Laterality Date   arm surgery     BACK SURGERY     X2   BOTOX INJECTION N/A 10/06/2021   Procedure: CYSTOSCOPY LITHOLAPAXY WITH BOTOX INJECTION;  Surgeon: Lucas Mallow, MD;  Location: [facility];  Service: Urology;  Laterality: N/A;   CYSTOSCOPY W/ URETERAL STENT PLACEMENT Right 11/29/2021   Procedure: CYSTOSCOPY WITH RETROGRADE PYELOGRAM/URETERAL STENT PLACEMENT;  Surgeon: Irine Seal, MD;  Location: [facility];  Service: Urology;  Laterality: Right;   FACIAL COSMETIC SURGERY     HERNIA REPAIR     IR US GUIDE BX ASP/DRAIN  10/19/2017   KIDNEY STONE SURGERY     LUMBAR SPINAL CORD SIMULATOR LEAD REMOVAL N/A 03/01/2019   Procedure: LUMBAR SPINAL CORD SIMULATOR LEAD REMOVAL;  Surgeon: Meade Maw, MD;  Location: [facility];  Service: Neurosurgery;  Laterality: N/A;   MASTECTOMY Left    SPINAL CORD STIMULATOR REMOVAL N/A 03/01/2019   Procedure: LUMBAR SPINAL CORD STIMULATOR REMOVAL;  Surgeon: Meade Maw, MD;  Location: [facility];  Service: Neurosurgery;  Laterality: N/A;   WISDOM TOOTH EXTRACTION      Home Medications:  Medications Prior to Admission  Medication Sig Dispense Refill Last Dose   acetaminophen (TYLENOL) 500 MG  tablet Take 1,000 mg by mouth every 6 (six) hours as needed for mild pain or moderate pain.   Past Week   ascorbic acid (VITAMIN C) 500 MG tablet Take 500 mg by mouth daily.   Past Week   cholecalciferol (VITAMIN D3) 25 MCG (1000 UNIT) tablet Take 2 tablets (2,000 Units total) by mouth daily.   Past Week   cyanocobalamin (VITAMIN B12) 500 MCG tablet Take 2 tablets (1,000 mcg total) by mouth 2 (two) times daily.   Past Week   diltiazem (CARDIZEM CD) 120 MG 24 hr capsule Take 1 capsule (120 mg total) by mouth every evening. 30 capsule 0 01/04/2022 at 2100   docusate sodium (COLACE) 100 MG capsule Take 1 capsule (100 mg total) by mouth in the morning, at noon, and at bedtime. (Patient taking differently: Take 100-300 mg by mouth at bedtime.) 90 capsule 0 Past Week   linaclotide (LINZESS) 145 MCG CAPS capsule Take 1 capsule (145 mcg total) by mouth daily before breakfast. (Patient taking differently: Take 145 mcg by mouth daily as needed (constipation).) 30 capsule 3 Past Week   mirabegron ER (MYRBETRIQ) 50 MG TB24 tablet Take 1 tablet (50 mg total) by mouth daily. 30 tablet 0 01/04/2022   nitrofurantoin, macrocrystal-monohydrate, (MACROBID) 100 MG capsule Take 1 capsule (100 mg total) by mouth at bedtime. Start on 12/08/21 30  capsule 0 Past Week   oxyCODONE (ROXICODONE) 15 MG immediate release tablet Take 1 tablet (15 mg total) by mouth every 6 (six) hours as needed for pain. 30 tablet 0 Past Week   polyethylene glycol (MIRALAX / GLYCOLAX) 17 g packet Take 17 g by mouth 2 (two) times daily. (Patient taking differently: Take 17 g by mouth daily as needed for mild constipation or moderate constipation.) 14 each 3 Past Week   rOPINIRole (REQUIP) 0.5 MG tablet Take 0.5 mg by mouth at bedtime as needed (restless legs).   01/04/2022   solifenacin (VESICARE) 5 MG tablet Take 5 mg by mouth daily.   01/04/2022   tamsulosin (FLOMAX) 0.4 MG CAPS capsule Take 1 capsule (0.4 mg total) by mouth every evening. 30 capsule  0 01/04/2022   venlafaxine XR (EFFEXOR-XR) 75 MG 24 hr capsule Take 1 capsule (75 mg total) by mouth in the morning and at bedtime. 60 capsule 0 01/04/2022   ondansetron (ZOFRAN) 4 MG tablet Take 1 tablet (4 mg total) by mouth every 8 (eight) hours as needed for nausea or vomiting. 30 tablet 0 More than a month   sulfamethoxazole-trimethoprim (BACTRIM) 400-80 MG tablet Take 1 tablet by mouth daily.      Allergies:  Allergies  Allergen Reactions   Erythromycin Itching    Family History  Problem Relation Age of Onset   Heart disease Father        Living, 62   Breast cancer Mother        Living, 43   Hypercholesterolemia Mother    Healthy Brother    Healthy Sister    Social History:  reports that he quit smoking about 30 years ago. His smoking use included cigarettes. He has a 20.00 pack-year smoking history. He quit smokeless tobacco use about 30 years ago. He reports current alcohol use. He reports that he does not use drugs.  ROS: A complete review of systems was performed.  All systems are negative except for pertinent findings as noted. ROS   Physical Exam:  Vital signs in last 24 hours: Temp:  [98.5 F (36.9 C)] 98.5 F (36.9 C) (11/27 0858) Pulse Rate:  [85] 85 (11/27 0858) Resp:  [18] 18 (11/27 0858) BP: (141)/(70) 141/70 (11/27 0858) SpO2:  [95 %] 95 % (11/27 0858) Weight:  [95.3 kg] 95.3 kg (11/27 0858) General:  Alert and oriented, No acute distress HEENT: Normocephalic, atraumatic Neck: No JVD or lymphadenopathy Cardiovascular: Regular rate and rhythm Lungs: Regular rate and effort Abdomen: Soft, nontender, nondistended, no abdominal masses Back: No CVA tenderness Extremities: No edema Neurologic: Grossly intact  Laboratory Data:  No results found for this or any previous visit (from the past 24 hour(s)). No results found for this or any previous visit (from the past 240 hour(s)). Creatinine: Recent Labs    12/29/21 1112  CREATININE 1.11     Impression/Assessment:  Right ureteral calculus  Plan:  Proceed with cystoscopy with right retrograde pyelogram with right ureteroscopy with laser lithotripsy and stone extraction and right ureteral stent exchange.  Risk and benefits discussed.  Specifically discussed his increased risk of UTI.  Marton Redwood, III 01/05/2022, 9:45 AM

## 2022-01-05 NOTE — Anesthesia Postprocedure Evaluation (Signed)
Anesthesia Post Note  Patient: Luke Foster  Procedure(s) Performed: CYSTOSCOPY RIGHT URETEROSCOPY/HOLMIUM LASER/STENT PLACEMENT basket extraction stones (Right: Ureter)     Patient location during evaluation: PACU Anesthesia Type: General Level of consciousness: awake Pain management: pain level controlled Vital Signs Assessment: post-procedure vital signs reviewed and stable Respiratory status: spontaneous breathing Cardiovascular status: stable Postop Assessment: no apparent nausea or vomiting Anesthetic complications: no   No notable events documented.  Last Vitals:  Vitals:   01/05/22 1151 01/05/22 1200  BP: (!) 149/75   Pulse: 78 74  Resp: 17 16  Temp: 37 C   SpO2: 100% 98%    Last Pain:  Vitals:   01/05/22 1200  TempSrc:   PainSc: 0-No pain                 Mariyanna Mucha

## 2022-01-05 NOTE — Transfer of Care (Signed)
Immediate Anesthesia Transfer of Care Note  Patient: Luke Foster  Procedure(s) Performed: CYSTOSCOPY RIGHT URETEROSCOPY/HOLMIUM LASER/STENT PLACEMENT basket extraction stones (Right: Ureter)  Patient Location: PACU  Anesthesia Type:General  Level of Consciousness: awake, alert , and oriented  Airway & Oxygen Therapy: Patient Spontanous Breathing and Patient connected to face mask oxygen  Post-op Assessment: Report given to RN and Post -op Vital signs reviewed and stable  Post vital signs: Reviewed and stable  Last Vitals:  Vitals Value Taken Time  BP    Temp    Pulse    Resp    SpO2      Last Pain:  Vitals:   01/05/22 0858  TempSrc: Oral  PainSc: 6          Complications: No notable events documented.

## 2022-01-05 NOTE — Anesthesia Procedure Notes (Signed)
Procedure Name: LMA Insertion Date/Time: 01/05/2022 10:17 AM  Performed by: Maxwell Caul, CRNAPre-anesthesia Checklist: Patient identified, Emergency Drugs available, Suction available and Patient being monitored Patient Re-evaluated:Patient Re-evaluated prior to induction Oxygen Delivery Method: Circle system utilized Preoxygenation: Pre-oxygenation with 100% oxygen Induction Type: IV induction LMA: LMA inserted LMA Size: 4.0 Number of attempts: 2 Placement Confirmation: positive ETCO2 and breath sounds checked- equal and bilateral Tube secured with: Tape Dental Injury: Injury to lip  Comments: Slight injury to upper lip noted after LMA placed (sharp teeth). Lubrication placed on site.

## 2022-01-05 NOTE — Discharge Instructions (Signed)

## 2022-01-05 NOTE — Anesthesia Preprocedure Evaluation (Signed)
Anesthesia Evaluation  Patient identified by MRN, date of birth, ID band Patient awake  General Assessment Comment:History noted Dr. Nyoka Cowden  Reviewed: Allergy & Precautions, NPO status , Patient's Chart, lab work & pertinent test results  Airway Mallampati: II       Dental   Pulmonary sleep apnea , former smoker   breath sounds clear to auscultation       Cardiovascular hypertension,  Rhythm:Regular Rate:Normal     Neuro/Psych  Neuromuscular disease    GI/Hepatic negative GI ROS, Neg liver ROS,,,  Endo/Other    Renal/GU Renal disease     Musculoskeletal  (+) Arthritis ,    Abdominal   Peds  Hematology   Anesthesia Other Findings   Reproductive/Obstetrics                             Anesthesia Physical Anesthesia Plan  ASA: 3  Anesthesia Plan: General   Post-op Pain Management:    Induction: Intravenous  PONV Risk Score and Plan: 3 and Ondansetron, Dexamethasone and Midazolam  Airway Management Planned: LMA  Additional Equipment:   Intra-op Plan:   Post-operative Plan: Extubation in OR  Informed Consent: I have reviewed the patients History and Physical, chart, labs and discussed the procedure including the risks, benefits and alternatives for the proposed anesthesia with the patient or authorized representative who has indicated his/her understanding and acceptance.     Dental advisory given  Plan Discussed with: CRNA and Anesthesiologist  Anesthesia Plan Comments:        Anesthesia Quick Evaluation

## 2022-01-06 ENCOUNTER — Encounter (HOSPITAL_COMMUNITY): Payer: Self-pay | Admitting: Urology

## 2022-01-07 DIAGNOSIS — N136 Pyonephrosis: Secondary | ICD-10-CM | POA: Diagnosis not present

## 2022-01-07 DIAGNOSIS — Z466 Encounter for fitting and adjustment of urinary device: Secondary | ICD-10-CM | POA: Diagnosis not present

## 2022-01-07 DIAGNOSIS — N200 Calculus of kidney: Secondary | ICD-10-CM | POA: Diagnosis not present

## 2022-01-07 DIAGNOSIS — B9689 Other specified bacterial agents as the cause of diseases classified elsewhere: Secondary | ICD-10-CM | POA: Diagnosis not present

## 2022-01-07 DIAGNOSIS — M479 Spondylosis, unspecified: Secondary | ICD-10-CM | POA: Diagnosis not present

## 2022-01-07 DIAGNOSIS — N319 Neuromuscular dysfunction of bladder, unspecified: Secondary | ICD-10-CM | POA: Diagnosis not present

## 2022-01-12 ENCOUNTER — Emergency Department (HOSPITAL_COMMUNITY): Payer: Medicare Other

## 2022-01-12 ENCOUNTER — Inpatient Hospital Stay (HOSPITAL_COMMUNITY): Payer: Medicare Other

## 2022-01-12 ENCOUNTER — Inpatient Hospital Stay (HOSPITAL_COMMUNITY)
Admission: EM | Admit: 2022-01-12 | Discharge: 2022-01-20 | DRG: 330 | Disposition: A | Discharge status: Home / Self Care | Payer: Medicare Other | Attending: Internal Medicine | Admitting: Internal Medicine

## 2022-01-12 ENCOUNTER — Encounter (HOSPITAL_COMMUNITY): Payer: Self-pay

## 2022-01-12 DIAGNOSIS — Z9989 Dependence on other enabling machines and devices: Secondary | ICD-10-CM | POA: Diagnosis not present

## 2022-01-12 DIAGNOSIS — Z87891 Personal history of nicotine dependence: Secondary | ICD-10-CM | POA: Diagnosis not present

## 2022-01-12 DIAGNOSIS — N319 Neuromuscular dysfunction of bladder, unspecified: Secondary | ICD-10-CM | POA: Diagnosis present

## 2022-01-12 DIAGNOSIS — Z803 Family history of malignant neoplasm of breast: Secondary | ICD-10-CM

## 2022-01-12 DIAGNOSIS — Z79899 Other long term (current) drug therapy: Secondary | ICD-10-CM

## 2022-01-12 DIAGNOSIS — M5417 Radiculopathy, lumbosacral region: Secondary | ICD-10-CM | POA: Diagnosis present

## 2022-01-12 DIAGNOSIS — N132 Hydronephrosis with renal and ureteral calculous obstruction: Secondary | ICD-10-CM | POA: Diagnosis present

## 2022-01-12 DIAGNOSIS — E876 Hypokalemia: Secondary | ICD-10-CM | POA: Diagnosis present

## 2022-01-12 DIAGNOSIS — Z981 Arthrodesis status: Secondary | ICD-10-CM | POA: Diagnosis not present

## 2022-01-12 DIAGNOSIS — K56609 Unspecified intestinal obstruction, unspecified as to partial versus complete obstruction: Secondary | ICD-10-CM | POA: Diagnosis not present

## 2022-01-12 DIAGNOSIS — M1990 Unspecified osteoarthritis, unspecified site: Secondary | ICD-10-CM | POA: Diagnosis not present

## 2022-01-12 DIAGNOSIS — K660 Peritoneal adhesions (postprocedural) (postinfection): Principal | ICD-10-CM | POA: Diagnosis present

## 2022-01-12 DIAGNOSIS — R0682 Tachypnea, not elsewhere classified: Secondary | ICD-10-CM | POA: Diagnosis not present

## 2022-01-12 DIAGNOSIS — Z8249 Family history of ischemic heart disease and other diseases of the circulatory system: Secondary | ICD-10-CM | POA: Diagnosis not present

## 2022-01-12 DIAGNOSIS — Z881 Allergy status to other antibiotic agents status: Secondary | ICD-10-CM | POA: Diagnosis not present

## 2022-01-12 DIAGNOSIS — G2581 Restless legs syndrome: Secondary | ICD-10-CM | POA: Diagnosis not present

## 2022-01-12 DIAGNOSIS — I10 Essential (primary) hypertension: Secondary | ICD-10-CM | POA: Diagnosis present

## 2022-01-12 DIAGNOSIS — K6389 Other specified diseases of intestine: Secondary | ICD-10-CM | POA: Diagnosis not present

## 2022-01-12 DIAGNOSIS — Z743 Need for continuous supervision: Secondary | ICD-10-CM | POA: Diagnosis not present

## 2022-01-12 DIAGNOSIS — M545 Low back pain, unspecified: Secondary | ICD-10-CM | POA: Diagnosis present

## 2022-01-12 DIAGNOSIS — Z9012 Acquired absence of left breast and nipple: Secondary | ICD-10-CM | POA: Diagnosis not present

## 2022-01-12 DIAGNOSIS — Z960 Presence of urogenital implants: Secondary | ICD-10-CM | POA: Diagnosis present

## 2022-01-12 DIAGNOSIS — M542 Cervicalgia: Secondary | ICD-10-CM | POA: Diagnosis not present

## 2022-01-12 DIAGNOSIS — F419 Anxiety disorder, unspecified: Secondary | ICD-10-CM | POA: Insufficient documentation

## 2022-01-12 DIAGNOSIS — E785 Hyperlipidemia, unspecified: Secondary | ICD-10-CM | POA: Diagnosis present

## 2022-01-12 DIAGNOSIS — Z87442 Personal history of urinary calculi: Secondary | ICD-10-CM

## 2022-01-12 DIAGNOSIS — F32A Depression, unspecified: Secondary | ICD-10-CM | POA: Diagnosis present

## 2022-01-12 DIAGNOSIS — R109 Unspecified abdominal pain: Secondary | ICD-10-CM | POA: Diagnosis not present

## 2022-01-12 DIAGNOSIS — D72829 Elevated white blood cell count, unspecified: Secondary | ICD-10-CM | POA: Diagnosis not present

## 2022-01-12 DIAGNOSIS — G4730 Sleep apnea, unspecified: Secondary | ICD-10-CM | POA: Diagnosis not present

## 2022-01-12 DIAGNOSIS — G8929 Other chronic pain: Secondary | ICD-10-CM | POA: Diagnosis present

## 2022-01-12 DIAGNOSIS — Z4682 Encounter for fitting and adjustment of non-vascular catheter: Secondary | ICD-10-CM | POA: Diagnosis not present

## 2022-01-12 DIAGNOSIS — N1330 Unspecified hydronephrosis: Secondary | ICD-10-CM | POA: Diagnosis not present

## 2022-01-12 DIAGNOSIS — G4733 Obstructive sleep apnea (adult) (pediatric): Secondary | ICD-10-CM | POA: Diagnosis not present

## 2022-01-12 DIAGNOSIS — F1120 Opioid dependence, uncomplicated: Secondary | ICD-10-CM | POA: Diagnosis present

## 2022-01-12 DIAGNOSIS — R627 Adult failure to thrive: Secondary | ICD-10-CM | POA: Diagnosis present

## 2022-01-12 DIAGNOSIS — I499 Cardiac arrhythmia, unspecified: Secondary | ICD-10-CM | POA: Diagnosis not present

## 2022-01-12 DIAGNOSIS — K922 Gastrointestinal hemorrhage, unspecified: Secondary | ICD-10-CM | POA: Diagnosis not present

## 2022-01-12 DIAGNOSIS — K654 Sclerosing mesenteritis: Secondary | ICD-10-CM | POA: Diagnosis not present

## 2022-01-12 DIAGNOSIS — R140 Abdominal distension (gaseous): Secondary | ICD-10-CM | POA: Diagnosis not present

## 2022-01-12 DIAGNOSIS — N200 Calculus of kidney: Secondary | ICD-10-CM | POA: Diagnosis not present

## 2022-01-12 LAB — CBC WITH DIFFERENTIAL/PLATELET
Abs Immature Granulocytes: 0.11 10*3/uL — ABNORMAL HIGH (ref 0.00–0.07)
Basophils Absolute: 0.1 10*3/uL (ref 0.0–0.1)
Basophils Relative: 1 %
Eosinophils Absolute: 0 10*3/uL (ref 0.0–0.5)
Eosinophils Relative: 0 %
HCT: 39.8 % (ref 39.0–52.0)
Hemoglobin: 12.6 g/dL — ABNORMAL LOW (ref 13.0–17.0)
Immature Granulocytes: 1 %
Lymphocytes Relative: 7 %
Lymphs Abs: 1 10*3/uL (ref 0.7–4.0)
MCH: 29.2 pg (ref 26.0–34.0)
MCHC: 31.7 g/dL (ref 30.0–36.0)
MCV: 92.3 fL (ref 80.0–100.0)
Monocytes Absolute: 0.7 10*3/uL (ref 0.1–1.0)
Monocytes Relative: 5 %
Neutro Abs: 12.9 10*3/uL — ABNORMAL HIGH (ref 1.7–7.7)
Neutrophils Relative %: 86 %
Platelets: 359 10*3/uL (ref 150–400)
RBC: 4.31 MIL/uL (ref 4.22–5.81)
RDW: 13.2 % (ref 11.5–15.5)
WBC: 14.8 10*3/uL — ABNORMAL HIGH (ref 4.0–10.5)
nRBC: 0 % (ref 0.0–0.2)

## 2022-01-12 LAB — COMPREHENSIVE METABOLIC PANEL
ALT: 24 U/L (ref 0–44)
AST: 26 U/L (ref 15–41)
Albumin: 3.9 g/dL (ref 3.5–5.0)
Alkaline Phosphatase: 80 U/L (ref 38–126)
Anion gap: 11 (ref 5–15)
BUN: 18 mg/dL (ref 8–23)
CO2: 23 mmol/L (ref 22–32)
Calcium: 9.8 mg/dL (ref 8.9–10.3)
Chloride: 103 mmol/L (ref 98–111)
Creatinine, Ser: 1.2 mg/dL (ref 0.61–1.24)
GFR, Estimated: >60 mL/min (ref >60)
Glucose, Bld: 117 mg/dL — ABNORMAL HIGH (ref 70–99)
Potassium: 3.7 mmol/L (ref 3.5–5.1)
Sodium: 137 mmol/L (ref 135–145)
Total Bilirubin: 0.6 mg/dL (ref 0.3–1.2)
Total Protein: 8 g/dL (ref 6.5–8.1)

## 2022-01-12 LAB — LIPASE, BLOOD: Lipase: 32 U/L (ref 11–51)

## 2022-01-12 LAB — LACTIC ACID, PLASMA: Lactic Acid, Venous: 2.1 mmol/L (ref 0.5–1.9)

## 2022-01-12 LAB — URINALYSIS, ROUTINE W REFLEX MICROSCOPIC
Bilirubin Urine: NEGATIVE
Glucose, UA: NEGATIVE mg/dL
Ketones, ur: 5 mg/dL — AB
Nitrite: NEGATIVE
Protein, ur: 100 mg/dL — AB
RBC / HPF: >50 RBC/hpf — ABNORMAL HIGH (ref 0–5)
Specific Gravity, Urine: 1.02 (ref 1.005–1.030)
WBC, UA: >50 WBC/hpf — ABNORMAL HIGH (ref 0–5)
pH: 5 (ref 5.0–8.0)

## 2022-01-12 MED ORDER — ONDANSETRON HCL 4 MG/2ML IJ SOLN
4.0000 mg | Freq: Once | INTRAMUSCULAR | Status: AC
  Administered 2022-01-12: 4 mg via INTRAVENOUS
  Filled 2022-01-12: qty 2

## 2022-01-12 MED ORDER — IOHEXOL 300 MG/ML  SOLN
100.0000 mL | Freq: Once | INTRAMUSCULAR | Status: AC | PRN
  Administered 2022-01-12: 100 mL via INTRAVENOUS

## 2022-01-12 MED ORDER — MORPHINE SULFATE (PF) 4 MG/ML IV SOLN
4.0000 mg | Freq: Once | INTRAVENOUS | Status: AC
  Administered 2022-01-12: 4 mg via INTRAVENOUS
  Filled 2022-01-12: qty 1

## 2022-01-12 MED ORDER — SODIUM CHLORIDE 0.9 % IV BOLUS
500.0000 mL | Freq: Once | INTRAVENOUS | Status: AC
  Administered 2022-01-12: 500 mL via INTRAVENOUS

## 2022-01-12 MED ORDER — HYDROMORPHONE HCL 1 MG/ML IJ SOLN
0.5000 mg | Freq: Once | INTRAMUSCULAR | Status: AC
  Administered 2022-01-12: 0.5 mg via INTRAVENOUS
  Filled 2022-01-12: qty 0.5

## 2022-01-12 MED ORDER — SODIUM CHLORIDE 0.9 % IV SOLN: INTRAVENOUS | Status: AC

## 2022-01-12 MED ORDER — MORPHINE SULFATE (PF) 2 MG/ML IV SOLN
2.0000 mg | INTRAVENOUS | Status: DC | PRN
  Administered 2022-01-12 – 2022-01-13 (×3): 2 mg via INTRAVENOUS
  Filled 2022-01-12 (×3): qty 1

## 2022-01-12 NOTE — ED Notes (Signed)
XR at bedside

## 2022-01-12 NOTE — ED Provider Notes (Signed)
Luke Foster  1406     History  Chief Complaint  Patient presents with   Abdominal Pain    Luke Foster is a 71 y.o. male.   Abdominal Pain   71 year old male presents emergency department with complaints of abdominal pain and nausea.  Patient states symptoms began late last night when he was sitting his in recliner watching TV.  He notes persistence of symptoms since onset.  Reports history of cystoscopy performed on 01/05/2022 for right ureteral stent placement where he has been uncomplicated and improving.  Describes abdominal pain is diffuse in nature with no point of focality and without radiation.  Patient reports feelings of nausea with dry heaves with no actual emesis.  Has not tried to eat food since symptom onset.  History of partial small bowel obstruction is also noted abdominal distention since yesterday. Patient reports chronic indwelling Foley catheter and is currently receiving antibiotic therapy in the form of Luke Foster; he has noticed catheter bag becoming more clear to parents and surgery.  Denies urinary retention, fever, chest pain, shortness of breath.  Last bowel movement this morning which was normal in nature.  Past medical history significant for neck pain syndrome of which is opioid dependent, spinal cord stimulator, nephrolithiasis, chronic back pain, hyperlipidemia  Home Medications Prior to Admission medications   Medication Sig Start Date End Date Taking? Authorizing Provider  acetaminophen (TYLENOL) 500 MG tablet Take 1,000 mg by mouth every 6 (six) hours as needed for mild pain or moderate pain.    [provider]  ascorbic acid (VITAMIN C) 500 MG tablet Take 500 mg by mouth daily.    [provider]  cholecalciferol (VITAMIN D3) 25 MCG (1000 UNIT) tablet Take 2 tablets (2,000 Units total) by mouth daily. 12/01/21   Rai, Vernelle Emerald, MD  cyanocobalamin  (VITAMIN B12) 500 MCG tablet Take 2 tablets (1,000 mcg total) by mouth 2 (two) times daily. 12/01/21   Rai, Vernelle Emerald, MD  diltiazem (CARDIZEM CD) 120 MG 24 hr capsule Take 1 capsule (120 mg total) by mouth every evening. 05/17/20   Medina-Vargas, Monina C, NP  docusate sodium (COLACE) 100 MG capsule Take 1 capsule (100 mg total) by mouth in the morning, at noon, and at bedtime. Patient taking differently: Take 100-300 mg by mouth at bedtime. 05/17/20   Medina-Vargas, Monina C, NP  linaclotide (LINZESS) 145 MCG CAPS capsule Take 1 capsule (145 mcg total) by mouth daily before breakfast. Patient taking differently: Take 145 mcg by mouth daily as needed (constipation). 12/02/21   Rai, Vernelle Emerald, MD  mirabegron ER (MYRBETRIQ) 50 MG TB24 tablet Take 1 tablet (50 mg total) by mouth daily. 05/17/20   Medina-Vargas, Monina C, NP  ondansetron (ZOFRAN) 4 MG tablet Take 1 tablet (4 mg total) by mouth every 8 (eight) hours as needed for nausea or vomiting. 12/01/21   Rai, Ripudeep Raliegh Ip, MD  oxyCODONE (ROXICODONE) 15 MG immediate release tablet Take 1 tablet (15 mg total) by mouth every 6 (six) hours as needed for pain. 05/17/20   Medina-Vargas, Monina C, NP  polyethylene glycol (MIRALAX / GLYCOLAX) 17 g packet Take 17 g by mouth 2 (two) times daily. Patient taking differently: Take 17 g by mouth daily as needed for mild constipation or moderate constipation. 05/17/20   Medina-Vargas, Monina C, NP  rOPINIRole (REQUIP) 0.5 MG tablet Take 0.5 mg by mouth at bedtime as needed (restless legs). 09/22/21  [provider]  solifenacin (VESICARE) 5 MG tablet Take 5 mg by mouth daily. 07/17/21   [provider]  sulfamethoxazole-trimethoprim (Luke Foster) 400-80 MG tablet Take 1 tablet by mouth daily. 12/18/21   [provider]  tamsulosin (FLOMAX) 0.4 MG CAPS capsule Take 1 capsule (0.4 mg total) by mouth every evening. 05/17/20   Medina-Vargas, Monina C, NP  venlafaxine XR (EFFEXOR-XR) 75 MG 24 hr capsule Take 1  capsule (75 mg total) by mouth in the morning and at bedtime. 05/17/20   Medina-Vargas, Monina C, NP      Allergies    Erythromycin    Review of Systems   Review of Systems  Gastrointestinal:  Positive for abdominal pain.  All other systems reviewed and are negative.   Physical Exam Updated Vital Signs BP (!) 150/90   Pulse (!) 103   Temp 98.8 F (37.1 C) (Oral)   Resp 18   Ht '5\' 10"'$  (1.778 m)   Wt 95.3 kg   SpO2 100%   BMI 30.13 kg/m  Physical Exam Vitals and nursing note reviewed.  Constitutional:      General: He is not in acute distress.    Appearance: He is well-developed.  HENT:     Head: Normocephalic and atraumatic.  Eyes:     Conjunctiva/sclera: Conjunctivae normal.  Cardiovascular:     Rate and Rhythm: Normal rate and regular rhythm.     Heart sounds: No murmur heard. Pulmonary:     Effort: Pulmonary effort is normal. No respiratory distress.     Breath sounds: Normal breath sounds. No wheezing, rhonchi or rales.  Abdominal:     Palpations: Abdomen is soft.     Tenderness: There is abdominal tenderness.     Comments: Generalized abdominal tenderness.  Musculoskeletal:        General: No swelling.     Cervical back: Neck supple.     Right lower leg: No edema.     Left lower leg: No edema.  Skin:    General: Skin is warm and dry.     Capillary Refill: Capillary refill takes less than 2 seconds.  Neurological:     Mental Status: He is alert.  Psychiatric:        Mood and Affect: Mood normal.     ED Results / Procedures / Treatments   Labs (all labs ordered are listed, but only abnormal results are displayed) Labs Reviewed  COMPREHENSIVE METABOLIC PANEL - Abnormal; Notable for the following components:      Result Value   Glucose, Bld 117 (*)    All other components within normal limits  CBC WITH DIFFERENTIAL/PLATELET - Abnormal; Notable for the following components:   WBC 14.8 (*)    Hemoglobin 12.6 (*)    Neutro Abs 12.9 (*)    Abs Immature  Granulocytes 0.11 (*)    All other components within normal limits  URINALYSIS, ROUTINE W REFLEX MICROSCOPIC - Abnormal; Notable for the following components:   APPearance CLOUDY (*)    Hgb urine dipstick LARGE (*)    Ketones, ur 5 (*)    Protein, ur 100 (*)    Leukocytes,Ua LARGE (*)    RBC / HPF >50 (*)    WBC, UA >50 (*)    Bacteria, UA FEW (*)    All other components within normal limits  LACTIC ACID, PLASMA - Abnormal; Notable for the following components:   Lactic Acid, Venous 2.1 (*)    All other components within normal limits  URINE CULTURE  LIPASE, BLOOD  LACTIC ACID, PLASMA    EKG None  Radiology DG Chest Port 1 View  Result Date: 01/12/2022 CLINICAL DATA:  NG tube placement EXAM: PORTABLE CHEST 1 VIEW COMPARISON:  Chest x-ray 06/17/2020.  Abdominal x-ray 01/12/2022. FINDINGS: Nasogastric tube tip is in the gastric fundus. Lungs are clear. There is no pleural effusion or pneumothorax. The cardiomediastinal silhouette is within normal limits. No acute fractures are seen. IMPRESSION: Nasogastric tube tip is in the gastric fundus. Electronically Signed   By: Ronney Asters M.D.   On: 01/12/2022 21:34   DG Abd Portable 1 View  Result Date: 01/12/2022 CLINICAL DATA:  NGT placement EXAM: PORTABLE ABDOMEN - 1 VIEW COMPARISON:  12/01/2021. FINDINGS: Gaseous distention of the small bowel loops overlying the upper abdomen. No NG tube is seen on this examination. IMPRESSION: Dilated bowel consistent with ileus or obstruction. If there is an NG tube in place, it is not visualized. Electronically Signed   By: Sammie Bench M.D.   On: 01/12/2022 19:55   CT Abdomen Pelvis W Contrast  Result Date: 01/12/2022 CLINICAL DATA:  Bowel obstruction suspected EXAM: CT ABDOMEN AND PELVIS WITH CONTRAST TECHNIQUE: Multidetector CT imaging of the abdomen and pelvis was performed using the standard protocol following bolus administration of intravenous contrast. RADIATION DOSE REDUCTION: This exam  was performed according to the departmental dose-optimization program which includes automated exposure control, adjustment of the mA and/or kV according to patient size and/or use of iterative reconstruction technique. CONTRAST:  156m OMNIPAQUE IOHEXOL 300 MG/ML  SOLN COMPARISON:  None Available. FINDINGS: Lower chest: No acute abnormality.  Coronary artery calcification. Hepatobiliary: No focal liver abnormality. No gallstones, gallbladder wall thickening, or pericholecystic fluid. No biliary dilatation. Pancreas: No focal lesion. Normal pancreatic contour. No surrounding inflammatory changes. No main pancreatic ductal dilatation. Spleen: Normal in size without focal abnormality. Adrenals/Urinary Tract: No adrenal nodule bilaterally. Bilateral kidneys enhance symmetrically. Bilateral fluid density lesions likely represent simple renal cysts measuring up to 12 cm on the left. Right nephrolithiasis measuring up to 2 mm. No left nephrolithiasis. No ureterolithiasis bilaterally. Right ureteral stent with proximal pigtail at the right ureteropelvic junction and distal pigtail within the urinary bladder lumen. Associated urothelial thickening of the right ureter and mild right hydronephrosis. No left hydroureteronephrosis. The urinary bladder is decompressed with Foley catheter tip and balloon terminating within the urinary bladder lumen. On delayed imaging, there is no urothelial wall thickening and there are no filling defects in the opacified portions of the bilateral collecting systems or ureters. Stomach/Bowel: Stomach is within normal limits. Gastric, proximal small bowel, as well as mid to distal small bowel fluid dilatation with air-fluid levels. Transition point noted within the right mid abdomen (2:46, 6:59). Associated small bowel mesenteric edema. No pneumatosis. No evidence of large bowel wall thickening or dilatation. Appendix appears normal. Vascular/Lymphatic: No abdominal aorta or iliac aneurysm.  Severe atherosclerotic plaque of the aorta and its branches. No abdominal, pelvic, or inguinal lymphadenopathy. Reproductive: Enlarged prostate measuring up to 4.6 cm with mass effect on the posterior wall of the urinary bladder. Other: No intraperitoneal free fluid. No intraperitoneal free gas. No organized fluid collection. Musculoskeletal: No abdominal wall hernia or abnormality. Diffusely decreased bone density. No suspicious lytic or blastic osseous lesions. No acute displaced fracture. Multilevel degenerative changes of the spine. Multilevel chronic vertebral body height loss involving the T12 and L1 levels. Posterolateral and interbody surgical hardware fusion of the L4-S1 levels. IMPRESSION: 1. High-grade small-bowel obstruction with a  transition point within the right mid abdomen. Associated mesenteric edema. No pneumatosis. Underlying internal hernia is not fully excluded. No perforation. 2. Mildly obstructive right ureteral stent: proximal pigtail at the right ureteropelvic junction with associated urothelial thickening of the right ureter and mild right hydronephrosis. Recommend urologic consultation. Correlate with urinalysis for superimposed infection. 3. Nonobstructive right nephrolithiasis measuring up to 2 mm. 4. Prostatomegaly with mass effect on the posterior wall of the urinary bladder. 5.  Aortic Atherosclerosis (ICD10-I70.0). Electronically Signed   By: Iven Finn M.D.   On: 01/12/2022 17:55    Procedures .Critical Care  Performed by: Wilnette Kales, PA Authorized by: Wilnette Kales, PA   Critical care provider statement:    Critical care time (minutes):  45   Critical care was necessary to treat or prevent imminent or life-threatening deterioration of the following conditions: Bowel obstruction.   Critical care was time spent personally by me on the following activities:  Development of treatment plan with patient or surrogate, discussions with consultants, evaluation of  patient's response to treatment, examination of patient, ordering and review of laboratory studies, ordering and review of radiographic studies, ordering and performing treatments and interventions, pulse oximetry, re-evaluation of patient's condition and review of old charts   I assumed direction of critical care for this patient from another provider in my specialty: no     Care discussed with: admitting provider       Medications Ordered in ED Medications  0.9 %  sodium chloride infusion ( Intravenous New Bag/Given Foster 2226)  morphine (PF) 2 MG/ML injection 2 mg (has no administration in time range)  ondansetron (ZOFRAN) injection 4 mg (4 mg Intravenous Given Foster 1456)  morphine (PF) 4 MG/ML injection 4 mg (4 mg Intravenous Given Foster 1456)  sodium chloride 0.9 % bolus 500 mL (0 mLs Intravenous Stopped Foster 1500)  HYDROmorphone (DILAUDID) injection 0.5 mg (0.5 mg Intravenous Given Foster 1703)  iohexol (OMNIPAQUE) 300 MG/ML solution 100 mL (100 mLs Intravenous Contrast Given Foster 1710)  sodium chloride 0.9 % bolus 500 mL (0 mLs Intravenous Stopped Foster 2123)  HYDROmorphone (DILAUDID) injection 0.5 mg (0.5 mg Intravenous Given Foster 1943)  ondansetron (ZOFRAN) injection 4 mg (4 mg Intravenous Given Foster 1943)  sodium chloride 0.9 % bolus 500 mL (500 mLs Intravenous New Bag/Given Foster 2226)    ED Course/ Medical Decision Making/ A&P Clinical Course as of 01/12/22 2230  Mon Jan 12, 2022  1816 [place] u[facility] Dr. [facility] regarding the patient [CR]  1838 Consulted general surgeon Dr. Okey Dupre regarding the patient.  She agreed with admission to hospital medicine, NG tube placement, n.p.o. after midnight and IV fluids. [CR]  2000 Consulted hospital medicine Dr. Josephine Cables regarding the patient [CR]    Clinical Course User Index [CR] Wilnette Kales, PA                           Medical Decision Making Amount and/or Complexity of Data  Reviewed Labs: ordered. Radiology: ordered.  Risk Prescription drug management. Decision regarding hospitalization.   This patient presents to the ED for concern of abdominal pain, this involves an extensive number of treatment options, and is a complaint that carries with it a high risk of complications and morbidity.  The differential diagnosis includes peritonitis, SBO/LBO, cholecystitis, gastritis, PUD, CBD pathology, hepatitis, mesenteric ischemia, nephrolithiasis, diverticulitis, volvulus, appendicitis, IBS, constipation   Co morbidities that complicate the patient evaluation  See HPI  Additional history obtained:  Additional history obtained from EMR External records from outside source obtained and reviewed including prior CT abdomen pelvis from 11/28/2021   Lab Tests:  I Ordered, and personally interpreted labs.  The pertinent results include: Leukocytosis of 14.8 with evidence of left shift.  Mild evidence of anemia with hemoglobin 12.6 of which is at patient's baseline.  Platelets within normal range.  No electrolyte abnormalities noted.  No transaminitis noted.  Renal function within normal limits.  UA significant for large leukocyte, greater than 50 RBC and greater than 50 WBC with few bacteria present; urine culture pending.  Patient currently taking Luke Foster for urinary symptoms postoperative.  Lipase within normal limits.   Imaging Studies ordered:  I ordered imaging studies including CT abdomen pelvis I independently visualized and interpreted imaging which showed high-grade small bowel obstruction.  Associated mesenteric edema.  No pneumatosis noted.  Underlying internal hernia not fully excluded.  No evidence of perforation.  Mildly obstructive right ureteral stent proximal pigtail at the right ureteropelvic junction with associated urothelial thickening of right ureter and mild right hydronephrosis.  Nonobstructive right nephrolithiasis measuring up to 2 mm.   Prostatomegaly with mass effect on posterior wall of bladder.  Aortic atherosclerosis. I agree with the radiologist interpretation  Cardiac Monitoring: / EKG:  The patient was maintained on a cardiac monitor.  I personally viewed and interpreted the cardiac monitored which showed an underlying rhythm of: Sinus rhythm   Consultations Obtained:  See ED course  Problem List / ED Course / Critical interventions / Medication management  Abdominal pain I ordered medication including Zofran for nausea, morphine and hydromorphone for pain.  100 mL of normal saline for rehydration.    Reevaluation of the patient after these medicines showed that the patient improved I have reviewed the patients home medicines and have made adjustments as needed   Social Determinants of Health:  Chronic opioid use.  Former cigarette use.  Denies illicit drug use or alcohol use.   Test / Admission - Considered:  Small bowel obstruction Vitals signs significant for hypertension with blood pressure elevated around 150/90 throughout visit. Otherwise within normal range and stable throughout visit. Laboratory/imaging studies significant for:.  Above Patient deemed to meet admission criteria given evidence of high-grade bowel obstruction as most likely cause of patient's symptoms.  General surgery, urology as well as hospital medicine was consulted regarding the patient given physical exam and findings via laboratory/imaging studies as depicted in ED course.  Treatment plan discussed along with patient and he is knowledge understanding was agreeable to said plan. Treatment plan were discussed at length with patient and they knowledge understanding was agreeable to said plan.  Appropriate consultations were made as described in the ED course.  Patient was stable upon admission to the hospital.         Final Clinical Impression(s) / ED Diagnoses Final diagnoses:  Small bowel obstruction [facility])    Rx / DC  Orders ED Discharge Orders     None         Wilnette Kales, [place] 01/12/22 2230    Godfrey Pick, MD 01/13/22 1711

## 2022-01-12 NOTE — ED Notes (Signed)
NGT coughed out by pt. Multiple attempts to reinsert failed. Will get next RN to try.

## 2022-01-12 NOTE — ED Notes (Signed)
Pt c/o abrupt abdominal pain and vomited a moderate amount of yellowish liquid. Pt reports the severe pain has subsided at this time. MD aware.

## 2022-01-12 NOTE — ED Notes (Signed)
Failed NGT attempt x2 by previous nurse.  66fNGT placed. Abd xray ordered for placement.

## 2022-01-12 NOTE — H&P (Addendum)
History and Physical    Patient: Luke Foster ZOX:[id number] DOB: May 30, 1950 DOA: 01/12/2022 DOS: the patient was seen and examined on 01/12/2022 PCP: [name], MD  Patient coming from: Home  Chief Complaint:  Chief Complaint  Patient presents with   Abdominal Pain   HPI: Luke Foster is a 71 y.o. male with medical history significant of  chronic back and neck pain with spinal cord stimulator, lumbosacral radiculopathy with spinal cord stimulator, history of nephrolithiasis, history of bilateral hydronephrosis, hyperlipidemia, neurogenic bladder with indwelling Foley, chronic constipation who presents to the emergency department due to 1 day of onset of abdominal pain and nausea.  Patient was sitting down on his recliner late last night when symptoms started, he complained of diffuse abdominal pain with no radiation, it is accompanied by nausea and dry heaves and patient has not eaten any meal since onset of symptoms.  He endorsed a history of partial small bowel obstruction.  Patient denies chest pain, shortness of breath, fever, chills.  Last bowel movement was this morning and it was normal. Patient had a cystoscopy done on 01/05/2022 for right ureteral stent placement and he has chronic indwelling catheter and is currently on Bactrim.   ED Course:  In the emergency department, he was tachycardic, BP was 149/95 and 90 metatarsals were within normal range.  Workup in the ED showed leukocytosis with a left shift and normocytic anemia.  BMP was normal except for blood glucose of 117.  Lipase 32, urinalysis was positive for large leukocytes, greater than 50 red blood cells and white blood cells and large hemoglobin urine dipstick. CT abdomen and pelvis with contrast showed: 1. High-grade small-bowel obstruction with a transition point within the right mid abdomen. Associated mesenteric edema. No pneumatosis. Underlying internal hernia is not fully excluded. No perforation. 2. Mildly  obstructive right ureteral stent: proximal pigtail at the right ureteropelvic junction with associated urothelial thickening of the right ureter and mild right hydronephrosis. Recommend urologic consultation. Correlate with urinalysis for superimposed infection. 3. Nonobstructive right nephrolithiasis measuring up to 2 mm. 4. Prostatomegaly with mass effect on the posterior wall of the urinary bladder. 5.  Aortic Atherosclerosis Abdominal x-ray showed dilated bowel consistent with ileus or obstruction.   Patient was treated with Dilaudid, morphine, Zofran, IV hydration was provided. Urologist was consulted, but there was no need for any intervention or change in current management per ED PA General surgery was consulted and recommended admitting patient, NG tube, n.p.o. at midnight and will plan to follow-up on patient in the morning.  Hospitalist was asked to admit patient for further evaluation and management.   Review of Systems: Review of systems as noted in the HPI. All other systems reviewed and are negative.   Past Medical History:  Diagnosis Date   Arthritis    Chronic back pain    Chronic pain 1999   Bilateral feet (R >L)   Difficult or painful urination    High blood pressure    History of kidney stones    H/O   Nerve damage    Pain management    Paresthesia of foot, bilateral    Renal disorder    Sleep apnea    USES CPAP   Weakness of both legs    Past Surgical History:  Procedure Laterality Date   arm surgery     BACK SURGERY     X2   BOTOX INJECTION N/A 10/06/2021   Procedure: CYSTOSCOPY LITHOLAPAXY WITH BOTOX INJECTION;  Surgeon: Gloriann Loan,  Desiree Hane, MD;  Location: [facility];  Service: Urology;  Laterality: N/A;   CYSTOSCOPY W/ URETERAL STENT PLACEMENT Right 11/29/2021   Procedure: CYSTOSCOPY WITH RETROGRADE PYELOGRAM/URETERAL STENT PLACEMENT;  Surgeon: Irine Seal, MD;  Location: [facility];  Service: Urology;  Laterality: Right;   CYSTOSCOPY/URETEROSCOPY/HOLMIUM  LASER/STENT PLACEMENT Right 01/05/2022   Procedure: CYSTOSCOPY RIGHT URETEROSCOPY/HOLMIUM LASER/STENT PLACEMENT basket extraction stones;  Surgeon: Lucas Mallow, MD;  Location: [facility];  Service: Urology;  Laterality: Right;  1 HR FOR CASE   FACIAL COSMETIC SURGERY     HERNIA REPAIR     IR US GUIDE BX ASP/DRAIN  10/19/2017   KIDNEY STONE SURGERY     LUMBAR SPINAL CORD SIMULATOR LEAD REMOVAL N/A 03/01/2019   Procedure: LUMBAR SPINAL CORD SIMULATOR LEAD REMOVAL;  Surgeon: Meade Maw, MD;  Location: [facility];  Service: Neurosurgery;  Laterality: N/A;   MASTECTOMY Left    SPINAL CORD STIMULATOR REMOVAL N/A 03/01/2019   Procedure: LUMBAR SPINAL CORD STIMULATOR REMOVAL;  Surgeon: Meade Maw, MD;  Location: [facility];  Service: Neurosurgery;  Laterality: N/A;   WISDOM TOOTH EXTRACTION      Social History:  reports that he quit smoking about 30 years ago. His smoking use included cigarettes. He has a 20.00 pack-year smoking history. He quit smokeless tobacco use about 30 years ago. He reports current alcohol use. He reports that he does not use drugs.   Allergies  Allergen Reactions   Erythromycin Itching    Family History  Problem Relation Age of Onset   Heart disease Father        Living, 1   Breast cancer Mother        Living, 81   Hypercholesterolemia Mother    Healthy Brother    Healthy Sister      Prior to Admission medications   Medication Sig Start Date End Date Taking? Authorizing Provider  acetaminophen (TYLENOL) 500 MG tablet Take 1,000 mg by mouth every 6 (six) hours as needed for mild pain or moderate pain.    [provider]  ascorbic acid (VITAMIN C) 500 MG tablet Take 500 mg by mouth daily.    [provider]  cholecalciferol (VITAMIN D3) 25 MCG (1000 UNIT) tablet Take 2 tablets (2,000 Units total) by mouth daily. 12/01/21   Rai, Vernelle Emerald, MD  cyanocobalamin (VITAMIN B12) 500 MCG tablet Take 2 tablets (1,000 mcg total) by mouth 2  (two) times daily. 12/01/21   Rai, Vernelle Emerald, MD  diltiazem (CARDIZEM CD) 120 MG 24 hr capsule Take 1 capsule (120 mg total) by mouth every evening. 05/17/20   Medina-Vargas, Monina C, NP  docusate sodium (COLACE) 100 MG capsule Take 1 capsule (100 mg total) by mouth in the morning, at noon, and at bedtime. Patient taking differently: Take 100-300 mg by mouth at bedtime. 05/17/20   Medina-Vargas, Monina C, NP  linaclotide (LINZESS) 145 MCG CAPS capsule Take 1 capsule (145 mcg total) by mouth daily before breakfast. Patient taking differently: Take 145 mcg by mouth daily as needed (constipation). 12/02/21   Rai, Vernelle Emerald, MD  mirabegron ER (MYRBETRIQ) 50 MG TB24 tablet Take 1 tablet (50 mg total) by mouth daily. 05/17/20   Medina-Vargas, Monina C, NP  ondansetron (ZOFRAN) 4 MG tablet Take 1 tablet (4 mg total) by mouth every 8 (eight) hours as needed for nausea or vomiting. 12/01/21   Rai, Ripudeep K, MD  oxyCODONE (ROXICODONE) 15 MG immediate release tablet Take 1 tablet (15 mg total) by  mouth every 6 (six) hours as needed for pain. 05/17/20   Medina-Vargas, Monina C, NP  polyethylene glycol (MIRALAX / GLYCOLAX) 17 g packet Take 17 g by mouth 2 (two) times daily. Patient taking differently: Take 17 g by mouth daily as needed for mild constipation or moderate constipation. 05/17/20   Medina-Vargas, Monina C, NP  rOPINIRole (REQUIP) 0.5 MG tablet Take 0.5 mg by mouth at bedtime as needed (restless legs). 09/22/21   [provider]  solifenacin (VESICARE) 5 MG tablet Take 5 mg by mouth daily. 07/17/21   [provider]  sulfamethoxazole-trimethoprim (BACTRIM) 400-80 MG tablet Take 1 tablet by mouth daily. 12/18/21   [provider]  tamsulosin (FLOMAX) 0.4 MG CAPS capsule Take 1 capsule (0.4 mg total) by mouth every evening. 05/17/20   Medina-Vargas, Monina C, NP  venlafaxine XR (EFFEXOR-XR) 75 MG 24 hr capsule Take 1 capsule (75 mg total) by mouth in the morning and at bedtime. 05/17/20    Medina-Vargas, Monina C, NP    Physical Exam: BP (!) 150/90   Pulse (!) 103   Temp 98.8 F (37.1 C) (Oral)   Resp 18   Ht '5\' 10"'$  (1.778 m)   Wt 95.3 kg   SpO2 100%   BMI 30.13 kg/m   General: 71 y.o. year-old male well developed well nourished in no acute distress.  Alert and oriented x3. HEENT: NCAT, EOMI Neck: Supple, trachea medial Cardiovascular: Tachycardia.  Regular rate and rhythm with no rubs or gallops.  No thyromegaly or JVD noted.  No lower extremity edema. 2/4 pulses in all 4 extremities. Respiratory: Clear to auscultation with no wheezes or rales. Good inspiratory effort. Abdomen: Soft, nontender nondistended with normal bowel sounds x4 quadrants. Muskuloskeletal: No cyanosis, clubbing or edema noted bilaterally Neuro: CN II-XII intact, strength 5/5 x 4, sensation, reflexes intact Skin: No ulcerative lesions noted or rashes Psychiatry: Judgement and insight appear normal. Mood is appropriate for condition and setting          Labs on Admission:  Basic Metabolic Panel: Recent Labs  Lab 01/12/22 1556  NA 137  K 3.7  CL 103  CO2 23  GLUCOSE 117*  BUN 18  CREATININE 1.20  CALCIUM 9.8   Liver Function Tests: Recent Labs  Lab 01/12/22 1556  AST 26  ALT 24  ALKPHOS 80  BILITOT 0.6  PROT 8.0  ALBUMIN 3.9   Recent Labs  Lab 01/12/22 1556  LIPASE 32   No results for input(s): "AMMONIA" in the last 168 hours. CBC: Recent Labs  Lab 01/12/22 1556  WBC 14.8*  NEUTROABS 12.9*  HGB 12.6*  HCT 39.8  MCV 92.3  PLT 359   Cardiac Enzymes: No results for input(s): "CKTOTAL", "CKMB", "CKMBINDEX", "TROPONINI" in the last 168 hours.  BNP (last 3 results) No results for input(s): "BNP" in the last 8760 hours.  ProBNP (last 3 results) No results for input(s): "PROBNP" in the last 8760 hours.  CBG: No results for input(s): "GLUCAP" in the last 168 hours.  Radiological Exams on Admission: DG Abd Portable 1 View  Result Date: 01/12/2022 CLINICAL  DATA:  NGT placement EXAM: PORTABLE ABDOMEN - 1 VIEW COMPARISON:  12/01/2021. FINDINGS: Gaseous distention of the small bowel loops overlying the upper abdomen. No NG tube is seen on this examination. IMPRESSION: Dilated bowel consistent with ileus or obstruction. If there is an NG tube in place, it is not visualized. Electronically Signed   By: Sammie Bench M.D.   On: 01/12/2022 19:55  CT Abdomen Pelvis W Contrast  Result Date: 01/12/2022 CLINICAL DATA:  Bowel obstruction suspected EXAM: CT ABDOMEN AND PELVIS WITH CONTRAST TECHNIQUE: Multidetector CT imaging of the abdomen and pelvis was performed using the standard protocol following bolus administration of intravenous contrast. RADIATION DOSE REDUCTION: This exam was performed according to the departmental dose-optimization program which includes automated exposure control, adjustment of the mA and/or kV according to patient size and/or use of iterative reconstruction technique. CONTRAST:  152m OMNIPAQUE IOHEXOL 300 MG/ML  SOLN COMPARISON:  None Available. FINDINGS: Lower chest: No acute abnormality.  Coronary artery calcification. Hepatobiliary: No focal liver abnormality. No gallstones, gallbladder wall thickening, or pericholecystic fluid. No biliary dilatation. Pancreas: No focal lesion. Normal pancreatic contour. No surrounding inflammatory changes. No main pancreatic ductal dilatation. Spleen: Normal in size without focal abnormality. Adrenals/Urinary Tract: No adrenal nodule bilaterally. Bilateral kidneys enhance symmetrically. Bilateral fluid density lesions likely represent simple renal cysts measuring up to 12 cm on the left. Right nephrolithiasis measuring up to 2 mm. No left nephrolithiasis. No ureterolithiasis bilaterally. Right ureteral stent with proximal pigtail at the right ureteropelvic junction and distal pigtail within the urinary bladder lumen. Associated urothelial thickening of the right ureter and mild right hydronephrosis. No  left hydroureteronephrosis. The urinary bladder is decompressed with Foley catheter tip and balloon terminating within the urinary bladder lumen. On delayed imaging, there is no urothelial wall thickening and there are no filling defects in the opacified portions of the bilateral collecting systems or ureters. Stomach/Bowel: Stomach is within normal limits. Gastric, proximal small bowel, as well as mid to distal small bowel fluid dilatation with air-fluid levels. Transition point noted within the right mid abdomen (2:46, 6:59). Associated small bowel mesenteric edema. No pneumatosis. No evidence of large bowel wall thickening or dilatation. Appendix appears normal. Vascular/Lymphatic: No abdominal aorta or iliac aneurysm. Severe atherosclerotic plaque of the aorta and its branches. No abdominal, pelvic, or inguinal lymphadenopathy. Reproductive: Enlarged prostate measuring up to 4.6 cm with mass effect on the posterior wall of the urinary bladder. Other: No intraperitoneal free fluid. No intraperitoneal free gas. No organized fluid collection. Musculoskeletal: No abdominal wall hernia or abnormality. Diffusely decreased bone density. No suspicious lytic or blastic osseous lesions. No acute displaced fracture. Multilevel degenerative changes of the spine. Multilevel chronic vertebral body height loss involving the T12 and L1 levels. Posterolateral and interbody surgical hardware fusion of the L4-S1 levels. IMPRESSION: 1. High-grade small-bowel obstruction with a transition point within the right mid abdomen. Associated mesenteric edema. No pneumatosis. Underlying internal hernia is not fully excluded. No perforation. 2. Mildly obstructive right ureteral stent: proximal pigtail at the right ureteropelvic junction with associated urothelial thickening of the right ureter and mild right hydronephrosis. Recommend urologic consultation. Correlate with urinalysis for superimposed infection. 3. Nonobstructive right  nephrolithiasis measuring up to 2 mm. 4. Prostatomegaly with mass effect on the posterior wall of the urinary bladder. 5.  Aortic Atherosclerosis (ICD10-I70.0). Electronically Signed   By: MIven FinnM.D.   On: 01/12/2022 17:55    EKG: I independently viewed the EKG done and my findings are as followed: EKG was not done in the ED  Assessment/Plan Present on Admission:  Small bowel obstruction (HCC)  Chronic bilateral low back pain without sciatica  Benign essential HTN  Principal Problem:   Small bowel obstruction (HCC) Active Problems:   Benign essential HTN   Chronic bilateral low back pain without sciatica   Leukocytosis   Anxiety   Depression   Restless leg syndrome  Small bowel obstruction Continue NG tube  Continue NPO at this time with plan to advance diet as tolerated Continue IV hydration Continue IV morphine 2 mg q.4h p.r.n. for moderate/severe pain Continue Zofran p.r.n. for nausea/vomiting General surgery was already consulted and will consult on patient in the morning per ED PA   Leukocytosis possibly reactive WBC 14.8, continue to monitor WBC with morning labs  Chronic back pain with lumbosacral radiculopathy Continue IV morphine as described above for small bowel obstruction  Essential hypertension Continue diltiazem  Anxiety/depression Continue venlafaxine  Restless leg syndrome Continue ropinirole  DVT prophylaxis: SCDs  Code Status: Full code  Family Communication: None at bedside  Consults: General surgery  Severity of Illness: The appropriate patient status for this patient is INPATIENT. Inpatient status is judged to be reasonable and necessary in order to provide the required intensity of service to ensure the patient's safety. The patient's presenting symptoms, physical exam findings, and initial radiographic and laboratory data in the context of their chronic comorbidities is felt to place them at high risk for further clinical  deterioration. Furthermore, it is not anticipated that the patient will be medically stable for discharge from the hospital within 2 midnights of admission.   * I certify that at the point of admission it is my clinical judgment that the patient will require inpatient hospital care spanning beyond 2 midnights from the point of admission due to high intensity of service, high risk for further deterioration and high frequency of surveillance required.*  Author: Bernadette Hoit, DO 01/12/2022 8:49 PM  For on call review [email or web address].

## 2022-01-12 NOTE — ED Triage Notes (Signed)
Pt BIBA from home. Pt c/o upper abd pain. Pt had recent kidney stone removal surgery. Pt c/o 8/10 pain.  Pt c/o nausea, no emesis.   18 R FA  VSS with EMS 154 CBG

## 2022-01-12 NOTE — ED Notes (Signed)
Patient transported to CT 

## 2022-01-13 ENCOUNTER — Other Ambulatory Visit: Payer: Self-pay

## 2022-01-13 ENCOUNTER — Inpatient Hospital Stay (HOSPITAL_COMMUNITY): Payer: Medicare Other

## 2022-01-13 DIAGNOSIS — K56609 Unspecified intestinal obstruction, unspecified as to partial versus complete obstruction: Secondary | ICD-10-CM | POA: Diagnosis not present

## 2022-01-13 LAB — LACTIC ACID, PLASMA
Lactic Acid, Venous: 1.5 mmol/L (ref 0.5–1.9)
Lactic Acid, Venous: 1.5 mmol/L (ref 0.5–1.9)

## 2022-01-13 MED ORDER — BISACODYL 10 MG RE SUPP
10.0000 mg | Freq: Every day | RECTAL | Status: DC
  Administered 2022-01-13 – 2022-01-20 (×7): 10 mg via RECTAL
  Filled 2022-01-13 (×8): qty 1

## 2022-01-13 MED ORDER — ROPINIROLE HCL 0.25 MG PO TABS
0.5000 mg | ORAL_TABLET | Freq: Every evening | ORAL | Status: DC | PRN
  Administered 2022-01-13 – 2022-01-19 (×6): 0.5 mg via ORAL
  Filled 2022-01-13 (×6): qty 2

## 2022-01-13 MED ORDER — FENTANYL CITRATE PF 50 MCG/ML IJ SOSY
50.0000 ug | PREFILLED_SYRINGE | INTRAMUSCULAR | Status: DC | PRN
  Administered 2022-01-13 – 2022-01-18 (×35): 50 ug via INTRAVENOUS
  Filled 2022-01-13 (×36): qty 1

## 2022-01-13 MED ORDER — VENLAFAXINE HCL ER 75 MG PO CP24
75.0000 mg | ORAL_CAPSULE | Freq: Every day | ORAL | Status: DC
  Administered 2022-01-14 – 2022-01-20 (×7): 75 mg via ORAL
  Filled 2022-01-13 (×7): qty 1

## 2022-01-13 MED ORDER — TRAZODONE HCL 50 MG PO TABS
100.0000 mg | ORAL_TABLET | Freq: Every evening | ORAL | Status: DC | PRN
  Administered 2022-01-13 – 2022-01-19 (×5): 100 mg
  Filled 2022-01-13 (×6): qty 2

## 2022-01-13 MED ORDER — ACETAMINOPHEN 500 MG PO TABS
1000.0000 mg | ORAL_TABLET | Freq: Four times a day (QID) | ORAL | Status: DC
  Administered 2022-01-13 – 2022-01-20 (×18): 1000 mg via ORAL
  Filled 2022-01-13 (×24): qty 2

## 2022-01-13 MED ORDER — METHOCARBAMOL 1000 MG/10ML IJ SOLN
INTRAMUSCULAR | Status: AC
  Filled 2022-01-13: qty 10

## 2022-01-13 MED ORDER — LORAZEPAM 2 MG/ML IJ SOLN
1.0000 mg | Freq: Four times a day (QID) | INTRAMUSCULAR | Status: DC | PRN
  Administered 2022-01-13 – 2022-01-17 (×6): 1 mg via INTRAVENOUS
  Filled 2022-01-13 (×8): qty 1

## 2022-01-13 MED ORDER — METHOCARBAMOL 1000 MG/10ML IJ SOLN
500.0000 mg | Freq: Three times a day (TID) | INTRAVENOUS | Status: DC
  Administered 2022-01-13 – 2022-01-16 (×9): 500 mg via INTRAVENOUS
  Filled 2022-01-13: qty 500
  Filled 2022-01-13 (×5): qty 5
  Filled 2022-01-13: qty 500
  Filled 2022-01-13 (×2): qty 5
  Filled 2022-01-13: qty 500

## 2022-01-13 MED ORDER — CHLORHEXIDINE GLUCONATE CLOTH 2 % EX PADS
6.0000 | MEDICATED_PAD | Freq: Every day | CUTANEOUS | Status: DC
  Administered 2022-01-13 – 2022-01-20 (×8): 6 via TOPICAL

## 2022-01-13 MED ORDER — TRAZODONE HCL 50 MG PO TABS: 100.0000 mg | ORAL_TABLET | Freq: Every evening | ORAL | Status: DC | PRN

## 2022-01-13 MED ORDER — LABETALOL HCL 5 MG/ML IV SOLN: 10.0000 mg | INTRAVENOUS | Status: DC | PRN

## 2022-01-13 MED ORDER — DILTIAZEM HCL ER COATED BEADS 120 MG PO CP24
120.0000 mg | ORAL_CAPSULE | Freq: Every evening | ORAL | Status: DC
  Administered 2022-01-13 – 2022-01-19 (×7): 120 mg via ORAL
  Filled 2022-01-13 (×7): qty 1

## 2022-01-13 NOTE — Progress Notes (Addendum)
PROGRESS NOTE   Luke Foster, is a 71 y.o. male, DOB - 07/01/1950, AJO:[id number]  Admit date - 01/12/2022   Admitting Physician Bernadette Hoit, DO  Outpatient Primary MD for the patient is [name], MD  LOS - 1  Chief Complaint  Patient presents with   Abdominal Pain      Brief Narrative:   71 y.o. male with medical history significant of  chronic back and neck pain with spinal cord stimulator, lumbosacral radiculopathy with spinal cord stimulator, history of nephrolithiasis, history of bilateral hydronephrosis, hyperlipidemia, neurogenic bladder with indwelling Foley, chronic constipation admitted 01/02/2022 with concerns about small bowel obstruction and mildly obstructed right ureteral stent with mild right hydronephrosis    -Assessment and Plan: 1)SBO--- NG tube came out, it has been replaced -General surgery consult appreciated -Recommends conservative management for now -May need  Gastrografin small bowel protocol on 01/14/2022 -He may need surgical intervention if fails conservative measures -As needed antiemetics and pain medications as ordered -Leukocytosis is probably reactive - 2)Rt sided Hydronephrosis-CT abdomen and pelvis shows Right ureteral stent with proximal pigtail at the right ureteropelvic junction and distal pigtail within the urinary bladder lumen. Associated urothelial thickening of the right ureter and mild right hydronephrosis. -Patient was scheduled to see Dr. Gloriann Loan at [facility] urology in [place] on 01/13/2022 -Discussed with on-call urologist Dr. Alyson Ingles -He states that this can wait until patient can follow-up as outpatient with Dr. Gloriann Loan -He states he will put in a note in the chart later -Continue Foley for now   3)Chronic back pain with lumbosacral radiculopathy --chronic opiate use -Switch to IV fentanyl as needed while NG tube in situ  4)Depression and anxiety---may use as needed IV lorazepam until oral intake resumes at which point may  switch back to Effexor  5)HTN--okay to use IV labetalol as needed elevated BP -May switch back to oral Cardizem when oral intake resumes  Status is: Inpatient   Disposition: The patient is from: Home              Anticipated d/c is to: Home              Anticipated d/c date is: > 3 days              Patient currently is not medically stable to d/c. Barriers: Not Clinically Stable-   Code Status :  -  Code Status: Prior   Family Communication:   Discussed with brother at bedside  DVT Prophylaxis  :   - SCDs  SCDs Start: 01/13/22 1318   Lab Results  Component Value Date   PLT 359 01/12/2022   Inpatient Medications  Scheduled Meds:  acetaminophen  1,000 mg Oral Q6H   bisacodyl  10 mg Rectal Daily   Chlorhexidine Gluconate Cloth  6 each Topical Daily   diltiazem  120 mg Oral QPM   venlafaxine XR  75 mg Oral Q breakfast   Continuous Infusions:  methocarbamol (ROBAXIN) IV 500 mg (01/13/22 1447)   PRN Meds:.fentaNYL (SUBLIMAZE) injection, rOPINIRole, traZODone   Anti-infectives (From admission, onward)    None       Subjective: [name] today has no fevers, no further emesis,  No chest pain,    Brother at bedside Abd pain better after medications  Objective: Vitals:   01/13/22 0000 01/13/22 0134 01/13/22 0452 01/13/22 1418  BP: 125/83 96/60 125/78 138/79  Pulse: 100 (!) 107 86 79  Resp: '19 18 18 20  '$ Temp:  98.7 F (37.1 C)  97.6 F (36.4 C) 97.9 F (36.6 C)  TempSrc:   Oral Oral  SpO2: 100% 100% 100% 99%  Weight:      Height:        Intake/Output Summary (Last 24 hours) at 01/13/2022 1657 Last data filed at 01/13/2022 1600 Gross per 24 hour  Intake 55 ml  Output 3100 ml  Net -3045 ml   Filed Weights   01/12/22 1424  Weight: 95.3 kg   Physical Exam  Gen:- Awake Alert, in no acute distress HEENT:- [place].AT, No sclera icterus Nose- NG tube in situ Neck-Supple Neck,No JVD,.  Lungs-  CTAB , fair symmetrical air movement CV- S1, S2 normal, regular   Abd-diminished bowel sounds, discomfort with palpation, not particularly distended, extremity/Skin:- No  edema, pedal pulses present  Psych-affect is appropriate, oriented x3 Neuro-chronic neuromuscular deficits especially of the right lower extremity , no additional new focal deficits, no tremors  Data Reviewed: I have personally reviewed following labs and imaging studies  CBC: Recent Labs  Lab 01/12/22 1556  WBC 14.8*  NEUTROABS 12.9*  HGB 12.6*  HCT 39.8  MCV 92.3  PLT 370   Basic Metabolic Panel: Recent Labs  Lab 01/12/22 1556  NA 137  K 3.7  CL 103  CO2 23  GLUCOSE 117*  BUN 18  CREATININE 1.20  CALCIUM 9.8   GFR: Estimated Creatinine Clearance: 67.3 mL/min (by C-G formula based on SCr of 1.2 mg/dL). Liver Function Tests: Recent Labs  Lab 01/12/22 1556  AST 26  ALT 24  ALKPHOS 80  BILITOT 0.6  PROT 8.0  ALBUMIN 3.9   Radiology Studies: DG CHEST PORT 1 VIEW  Result Date: 01/13/2022 CLINICAL DATA:  NG tube placement EXAM: PORTABLE CHEST 1 VIEW COMPARISON:  01/12/2022 FINDINGS: Esophagogastric tube with tip and side port below the diaphragm. The heart size and mediastinal contours are within normal limits. Both lungs are clear. The visualized skeletal structures are unremarkable. IMPRESSION: Esophagogastric tube with tip and side port below the diaphragm. Electronically Signed   By: Delanna Ahmadi M.D.   On: 01/13/2022 12:11   DG Chest Port 1 View  Result Date: 01/12/2022 CLINICAL DATA:  NG tube placement EXAM: PORTABLE CHEST 1 VIEW COMPARISON:  Chest x-ray 06/17/2020.  Abdominal x-ray 01/12/2022. FINDINGS: Nasogastric tube tip is in the gastric fundus. Lungs are clear. There is no pleural effusion or pneumothorax. The cardiomediastinal silhouette is within normal limits. No acute fractures are seen. IMPRESSION: Nasogastric tube tip is in the gastric fundus. Electronically Signed   By: Ronney Asters M.D.   On: 01/12/2022 21:34   DG Abd Portable 1 View  Result  Date: 01/12/2022 CLINICAL DATA:  NGT placement EXAM: PORTABLE ABDOMEN - 1 VIEW COMPARISON:  12/01/2021. FINDINGS: Gaseous distention of the small bowel loops overlying the upper abdomen. No NG tube is seen on this examination. IMPRESSION: Dilated bowel consistent with ileus or obstruction. If there is an NG tube in place, it is not visualized. Electronically Signed   By: Sammie Bench M.D.   On: 01/12/2022 19:55   CT Abdomen Pelvis W Contrast  Result Date: 01/12/2022 CLINICAL DATA:  Bowel obstruction suspected EXAM: CT ABDOMEN AND PELVIS WITH CONTRAST TECHNIQUE: Multidetector CT imaging of the abdomen and pelvis was performed using the standard protocol following bolus administration of intravenous contrast. RADIATION DOSE REDUCTION: This exam was performed according to the departmental dose-optimization program which includes automated exposure control, adjustment of the mA and/or kV according to patient size and/or use of iterative reconstruction  technique. CONTRAST:  177m OMNIPAQUE IOHEXOL 300 MG/ML  SOLN COMPARISON:  None Available. FINDINGS: Lower chest: No acute abnormality.  Coronary artery calcification. Hepatobiliary: No focal liver abnormality. No gallstones, gallbladder wall thickening, or pericholecystic fluid. No biliary dilatation. Pancreas: No focal lesion. Normal pancreatic contour. No surrounding inflammatory changes. No main pancreatic ductal dilatation. Spleen: Normal in size without focal abnormality. Adrenals/Urinary Tract: No adrenal nodule bilaterally. Bilateral kidneys enhance symmetrically. Bilateral fluid density lesions likely represent simple renal cysts measuring up to 12 cm on the left. Right nephrolithiasis measuring up to 2 mm. No left nephrolithiasis. No ureterolithiasis bilaterally. Right ureteral stent with proximal pigtail at the right ureteropelvic junction and distal pigtail within the urinary bladder lumen. Associated urothelial thickening of the right ureter and mild  right hydronephrosis. No left hydroureteronephrosis. The urinary bladder is decompressed with Foley catheter tip and balloon terminating within the urinary bladder lumen. On delayed imaging, there is no urothelial wall thickening and there are no filling defects in the opacified portions of the bilateral collecting systems or ureters. Stomach/Bowel: Stomach is within normal limits. Gastric, proximal small bowel, as well as mid to distal small bowel fluid dilatation with air-fluid levels. Transition point noted within the right mid abdomen (2:46, 6:59). Associated small bowel mesenteric edema. No pneumatosis. No evidence of large bowel wall thickening or dilatation. Appendix appears normal. Vascular/Lymphatic: No abdominal aorta or iliac aneurysm. Severe atherosclerotic plaque of the aorta and its branches. No abdominal, pelvic, or inguinal lymphadenopathy. Reproductive: Enlarged prostate measuring up to 4.6 cm with mass effect on the posterior wall of the urinary bladder. Other: No intraperitoneal free fluid. No intraperitoneal free gas. No organized fluid collection. Musculoskeletal: No abdominal wall hernia or abnormality. Diffusely decreased bone density. No suspicious lytic or blastic osseous lesions. No acute displaced fracture. Multilevel degenerative changes of the spine. Multilevel chronic vertebral body height loss involving the T12 and L1 levels. Posterolateral and interbody surgical hardware fusion of the L4-S1 levels. IMPRESSION: 1. High-grade small-bowel obstruction with a transition point within the right mid abdomen. Associated mesenteric edema. No pneumatosis. Underlying internal hernia is not fully excluded. No perforation. 2. Mildly obstructive right ureteral stent: proximal pigtail at the right ureteropelvic junction with associated urothelial thickening of the right ureter and mild right hydronephrosis. Recommend urologic consultation. Correlate with urinalysis for superimposed infection. 3.  Nonobstructive right nephrolithiasis measuring up to 2 mm. 4. Prostatomegaly with mass effect on the posterior wall of the urinary bladder. 5.  Aortic Atherosclerosis (ICD10-I70.0). Electronically Signed   By: MIven FinnM.D.   On: 01/12/2022 17:55    Scheduled Meds:  acetaminophen  1,000 mg Oral Q6H   bisacodyl  10 mg Rectal Daily   Chlorhexidine Gluconate Cloth  6 each Topical Daily   diltiazem  120 mg Oral QPM   venlafaxine XR  75 mg Oral Q breakfast   Continuous Infusions:  methocarbamol (ROBAXIN) IV 500 mg (01/13/22 1447)     LOS: 1 day   CRoxan HockeyM.D on 01/13/2022 at 4:57 PM  Go to [email or web address] - for contact info  [facility] - Office  [phone] If 7PM-7AM, please contact night-coverage [email or web address] 01/13/2022, 4:57 PM

## 2022-01-13 NOTE — Progress Notes (Signed)
NG tube placement checked by air auscultation then flushed with 30 mls water.  Tubing reconnected and suction checked and set to LIS.  Green gastric contents in tubing.

## 2022-01-13 NOTE — ED Notes (Signed)
Pt pulled out NGT. States that he wants to wait until the surgeon sees him because he is now pain free.

## 2022-01-13 NOTE — ED Notes (Signed)
Hospitalist aware of NGT removal.

## 2022-01-13 NOTE — TOC Progression Note (Signed)
  Transition of Care [facility]) Screening Note   Patient Details  Name: Luke Foster Date of Birth: [birth year]   Transition of Care [facility]) CM/SW Contact:    Boneta Lucks, RN Phone Number: 01/13/2022, 10:54 AM    Transition of Care Department [facility]) has reviewed patient and no TOC needs have been identified at this time. We will continue to monitor patient advancement through interdisciplinary progression rounds. If new patient transition needs arise, please place a TOC consult.   Barriers to Discharge: Continued Medical Work up     Living arrangements for the past 2 months: [place]                      Readmission Risk Interventions    12/01/2021    3:11 PM  Readmission Risk Prevention Plan  Transportation Screening Complete  PCP or Specialist Appt within 5-7 Days Complete  Home Care Screening Complete  Medication Review (RN CM) Complete

## 2022-01-13 NOTE — Progress Notes (Signed)
Patient arrived to the floor alert and oriented. Patient does have a h/o tbi.  Brother does help in decision making processes. Patient removed NG tube before admission to floor, MD aware.  Output from NG tube is approximately 1600cc.  Patient states he does feel better, and does have bowel sounds.  Patient has no c/o nausea or abdominal pain, just chronic pain from feet.  Patient is questioning surgery at this point and is waiting to speak to surgeon.

## 2022-01-13 NOTE — Final Consult Note (Signed)
Consultant Final Sign-Off Note    Assessment/Final recommendations  Luke Foster is a 71 y.o. male followed by Dr. Gloriann Loan at [facility] for nephrolithiasis. He was scheduled for ureteral stent removal today in [place]. He was admitted for small bowel obstruction and CT shows mild right hydronephrosis with a stent in place. This is consistent with recent ureteroscopic stone extraction and does not require further intervention. The patient can followup with Dr. Gloriann Loan at discharge for right ureteral stent removal   Wound care (if applicable):    Diet at discharge: per primary team   Activity at discharge: per primary team   Follow-up appointment:     Pending results:  Unresulted Labs (From admission, onward)     Start     Ordered   01/14/22 0500  CBC  Tomorrow morning,   R        01/13/22 1709   01/14/22 0500  Comprehensive metabolic panel  Tomorrow morning,   R        01/13/22 1709   01/12/22 1557  Urine Culture  Once,   URGENT       Question:  Indication  Answer:  Dysuria   01/12/22 1556             Medication recommendations:   Other recommendations:    Thank you for allowing [place] to participate in the care of your patient!  Please consult [place] again if you have further needs for your patient.  Nicolette Bang 01/13/2022 8:08 PM    Subjective     Objective  Vital signs in last 24 hours: Temp:  [97.6 F (36.4 C)-98.7 F (37.1 C)] 97.9 F (36.6 C) (12/05 1418) Pulse Rate:  [79-107] 79 (12/05 1418) Resp:  [18-20] 20 (12/05 1418) BP: (96-138)/(60-95) 138/79 (12/05 1418) SpO2:  [99 %-100 %] 99 % (12/05 1418)  General:    Pertinent labs and Studies: Recent Labs    01/12/22 1556  WBC 14.8*  HGB 12.6*  HCT 39.8   BMET Recent Labs    01/12/22 1556  NA 137  K 3.7  CL 103  CO2 23  GLUCOSE 117*  BUN 18  CREATININE 1.20  CALCIUM 9.8   No results for input(s): "LABURIN" in the last 72 hours. Results for orders placed or performed during the  hospital encounter of 11/21/21  Remove and replace urinary cath (placed > 5 days) then obtain urine culture from new indwelling urinary catheter.     Status: Abnormal   Collection Time: 11/21/21  2:12 AM   Specimen: Urine, Catheterized  Result Value Ref Range Status   Specimen Description   Final    URINE, CATHETERIZED Performed at [facility], [address] [place], [place] [address]    Special Requests   Final    NONE Performed at [facility], [address], [place], [place] [address]    Culture 80,000 COLONIES/mL ENTEROBACTER CLOACAE (A)  Final   Report Status 11/23/2021 FINAL  Final   Organism ID, Bacteria ENTEROBACTER CLOACAE (A)  Final      Susceptibility   Enterobacter cloacae - MIC*    CEFAZOLIN >=64 RESISTANT Resistant     CEFEPIME 2 SENSITIVE Sensitive     CIPROFLOXACIN <=0.25 SENSITIVE Sensitive     GENTAMICIN <=1 SENSITIVE Sensitive     IMIPENEM 0.5 SENSITIVE Sensitive     NITROFURANTOIN <=16 SENSITIVE Sensitive     TRIMETH/SULFA >=320 RESISTANT Resistant     PIP/TAZO 8 SENSITIVE Sensitive     *  80,000 COLONIES/mL ENTEROBACTER CLOACAE    Imaging: DG CHEST PORT 1 VIEW  Result Date: 01/13/2022 CLINICAL DATA:  NG tube placement EXAM: PORTABLE CHEST 1 VIEW COMPARISON:  01/12/2022 FINDINGS: Esophagogastric tube with tip and side port below the diaphragm. The heart size and mediastinal contours are within normal limits. Both lungs are clear. The visualized skeletal structures are unremarkable. IMPRESSION: Esophagogastric tube with tip and side port below the diaphragm. Electronically Signed   By: Delanna Ahmadi M.D.   On: 01/13/2022 12:11   DG Chest Port 1 View  Result Date: 01/12/2022 CLINICAL DATA:  NG tube placement EXAM: PORTABLE CHEST 1 VIEW COMPARISON:  Chest x-ray 06/17/2020.  Abdominal x-ray 01/12/2022. FINDINGS: Nasogastric tube tip is in the gastric fundus. Lungs are clear. There is no pleural effusion or pneumothorax.  The cardiomediastinal silhouette is within normal limits. No acute fractures are seen. IMPRESSION: Nasogastric tube tip is in the gastric fundus. Electronically Signed   By: Ronney Asters M.D.   On: 01/12/2022 21:34

## 2022-01-13 NOTE — Consult Note (Addendum)
[place]  Reason for Consult: Small bowel obstruction Referring Physician: Dion Saucier, PA  Chief Complaint   Abdominal Pain     HPI: Luke Foster is a 71 y.o. male who presents with a 1 day history of abdominal pain.  He states that over the weekend, he was starting to get a little bit more bloated, but did not think much of it.  Yesterday morning, he started having worsening abdominal pain, which prompted him to present to the emergency department.  While in the emergency department, he began to experience nausea with emesis.  He denies ever having symptoms like this previously, and denies ever having small bowel obstruction requiring treatment with NG tube placement.  His past medical history significant for chronic back and nerve pain resulting in paresthesias of his feet, chronic opioid use with constipation as a result, hypertension, and kidney stones.  He denies any history of abdominal surgeries excluding an open left inguinal hernia repair in the past.  He denies use of blood thinning medications.  In the ED, he underwent a CT abdomen and pelvis, which demonstrated high-grade small bowel obstruction with transition point in the right mid abdomen with some associated mesenteric edema, no pneumatosis or pneumoperitoneum, but underlying internal hernia not fully excluded; mildly obstructed right ureteral stent, and nonobstructive right nephrolithiasis measuring up to 2 mm.  Patient a leukocytosis of 14.8 upon admission and lactic acid was mildly elevated at 2.1.  It subsequently returned to 1.5 after IV fluid administration.  Upon evaluation this morning, he states that he is feeling much better since when he presented to the hospital.  NG tube was placed overnight with 1600 cc of bilious output, however patient pulled out NG tube overnight.  The patient states that he last had a bowel movement yesterday morning prior to presentation to the ED, and states that he  last passed a small amount of flatus this morning.  He denies any nausea, vomiting, or abdominal pain at this time.  Also denies fevers and chills.  Past Medical History:  Diagnosis Date   Arthritis    Chronic back pain    Chronic pain 1999   Bilateral feet (R >L)   Difficult or painful urination    High blood pressure    History of kidney stones    H/O   Nerve damage    Pain management    Paresthesia of foot, bilateral    Renal disorder    Sleep apnea    USES CPAP   Weakness of both legs     Past Surgical History:  Procedure Laterality Date   arm surgery     BACK SURGERY     X2   BOTOX INJECTION N/A 10/06/2021   Procedure: CYSTOSCOPY LITHOLAPAXY WITH BOTOX INJECTION;  Surgeon: Lucas Mallow, MD;  Location: [facility];  Service: Urology;  Laterality: N/A;   CYSTOSCOPY W/ URETERAL STENT PLACEMENT Right 11/29/2021   Procedure: CYSTOSCOPY WITH RETROGRADE PYELOGRAM/URETERAL STENT PLACEMENT;  Surgeon: Irine Seal, MD;  Location: [facility];  Service: Urology;  Laterality: Right;   CYSTOSCOPY/URETEROSCOPY/HOLMIUM LASER/STENT PLACEMENT Right 01/05/2022   Procedure: CYSTOSCOPY RIGHT URETEROSCOPY/HOLMIUM LASER/STENT PLACEMENT basket extraction stones;  Surgeon: Lucas Mallow, MD;  Location: [facility];  Service: Urology;  Laterality: Right;  1 HR FOR CASE   FACIAL COSMETIC SURGERY     HERNIA REPAIR     IR US GUIDE BX ASP/DRAIN  10/19/2017   KIDNEY STONE SURGERY     LUMBAR  SPINAL CORD SIMULATOR LEAD REMOVAL N/A 03/01/2019   Procedure: LUMBAR SPINAL CORD SIMULATOR LEAD REMOVAL;  Surgeon: Meade Maw, MD;  Location: [facility];  Service: Neurosurgery;  Laterality: N/A;   MASTECTOMY Left    SPINAL CORD STIMULATOR REMOVAL N/A 03/01/2019   Procedure: LUMBAR SPINAL CORD STIMULATOR REMOVAL;  Surgeon: Meade Maw, MD;  Location: [facility];  Service: Neurosurgery;  Laterality: N/A;   WISDOM TOOTH EXTRACTION      Family History  Problem Relation Age of Onset   Heart disease Father         Living, 60   Breast cancer Mother        Living, 82   Hypercholesterolemia Mother    Healthy Brother    Healthy Sister     Social History   Tobacco Use   Smoking status: Former    Packs/day: 2.00    Years: 10.00    Total pack years: 20.00    Types: Cigarettes    Quit date: 05/21/1991    Years since quitting: 30.6   Smokeless tobacco: Former    Quit date: 06/08/1991  Vaping Use   Vaping Use: Never used  Substance Use Topics   Alcohol use: Yes    Comment: rare   Drug use: No    Medications: I have reviewed the patient's current medications.  Allergies  Allergen Reactions   Erythromycin Itching     ROS:  Pertinent items are noted in HPI.  Blood pressure 125/78, pulse 86, temperature 97.6 F (36.4 C), temperature source Oral, resp. rate 18, height '5\' 10"'$  (1.778 m), weight 95.3 kg, SpO2 100 %. Physical Exam Vitals reviewed.  Constitutional:      Appearance: He is well-developed.  HENT:     Head: Normocephalic and atraumatic.  Eyes:     Extraocular Movements: Extraocular movements intact.     Pupils: Pupils are equal, round, and reactive to light.  Cardiovascular:     Rate and Rhythm: Normal rate.  Pulmonary:     Effort: Pulmonary effort is normal.  Abdominal:     Comments: Abdomen soft, mild distention, no percussion tenderness, nontender to palpation; no rigidity, guarding, rebound tenderness  Skin:    General: Skin is warm and dry.  Neurological:     General: No focal deficit present.     Mental Status: He is alert and oriented to person, place, and time.  Psychiatric:        Mood and Affect: Mood normal.        Behavior: Behavior normal.     Results: Results for orders placed or performed during the hospital encounter of 01/12/22 (from the past 48 hour(s))  Urinalysis, Routine w reflex microscopic Urine, Catheterized     Status: Abnormal   Collection Time: 01/12/22  2:35 PM  Result Value Ref Range   Color, Urine YELLOW YELLOW   APPearance  CLOUDY (A) CLEAR   Specific Gravity, Urine 1.020 1.005 - 1.030   pH 5.0 5.0 - 8.0   Glucose, UA NEGATIVE NEGATIVE mg/dL   Hgb urine dipstick LARGE (A) NEGATIVE   Bilirubin Urine NEGATIVE NEGATIVE   Ketones, ur 5 (A) NEGATIVE mg/dL   Protein, ur 100 (A) NEGATIVE mg/dL   Nitrite NEGATIVE NEGATIVE   Leukocytes,Ua LARGE (A) NEGATIVE   RBC / HPF >50 (H) 0 - 5 RBC/hpf   WBC, UA >50 (H) 0 - 5 WBC/hpf   Bacteria, UA FEW (A) NONE SEEN   WBC Clumps PRESENT    Mucus PRESENT  Ca Oxalate Crys, UA PRESENT     Comment: Performed at [facility], [address], [place], [place] [address]  Comprehensive metabolic panel     Status: Abnormal   Collection Time: 01/12/22  3:56 PM  Result Value Ref Range   Sodium 137 135 - 145 mmol/L   Potassium 3.7 3.5 - 5.1 mmol/L   Chloride 103 98 - 111 mmol/L   CO2 23 22 - 32 mmol/L   Glucose, Bld 117 (H) 70 - 99 mg/dL    Comment: Glucose reference range applies only to samples taken after fasting for at least 8 hours.   BUN 18 8 - 23 mg/dL   Creatinine, Ser 1.20 0.61 - 1.24 mg/dL   Calcium 9.8 8.9 - 10.3 mg/dL   Total Protein 8.0 6.5 - 8.1 g/dL   Albumin 3.9 3.5 - 5.0 g/dL   AST 26 15 - 41 U/L   ALT 24 0 - 44 U/L   Alkaline Phosphatase 80 38 - 126 U/L   Total Bilirubin 0.6 0.3 - 1.2 mg/dL   GFR, Estimated >60 >60 mL/min    Comment: (NOTE) Calculated using the CKD-EPI Creatinine Equation (2021)    Anion gap 11 5 - 15    Comment: Performed at [facility], [address], [place], [place] [address]  CBC with Differential     Status: Abnormal   Collection Time: 01/12/22  3:56 PM  Result Value Ref Range   WBC 14.8 (H) 4.0 - 10.5 K/uL   RBC 4.31 4.22 - 5.81 MIL/uL   Hemoglobin 12.6 (L) 13.0 - 17.0 g/dL   HCT 39.8 39.0 - 52.0 %   MCV 92.3 80.0 - 100.0 fL   MCH 29.2 26.0 - 34.0 pg   MCHC 31.7 30.0 - 36.0 g/dL   RDW 13.2 11.5 - 15.5 %   Platelets 359 150 - 400 K/uL   nRBC 0.0 0.0 - 0.2 %   Neutrophils Relative % 86 %   Neutro Abs 12.9 (H) 1.7 - 7.7  K/uL   Lymphocytes Relative 7 %   Lymphs Abs 1.0 0.7 - 4.0 K/uL   Monocytes Relative 5 %   Monocytes Absolute 0.7 0.1 - 1.0 K/uL   Eosinophils Relative 0 %   Eosinophils Absolute 0.0 0.0 - 0.5 K/uL   Basophils Relative 1 %   Basophils Absolute 0.1 0.0 - 0.1 K/uL   Immature Granulocytes 1 %   Abs Immature Granulocytes 0.11 (H) 0.00 - 0.07 K/uL    Comment: Performed at [facility], [address] [place], [place] [address]  Lipase, blood     Status: None   Collection Time: 01/12/22  3:56 PM  Result Value Ref Range   Lipase 32 11 - 51 U/L    Comment: Performed at [facility], [address], [place], [place] [address]  Lactic acid, plasma     Status: Abnormal   Collection Time: 01/12/22  7:46 PM  Result Value Ref Range   Lactic Acid, Venous 2.1 (HH) 0.5 - 1.9 mmol/L    Comment: CRITICAL RESULT CALLED TO, READ BACK BY AND VERIFIED WITH: RUSH,C ON 01/12/22 AT 2020 BY LOY,C Performed at [facility], [address] [place], [place] [address]   Lactic acid, plasma     Status: None   Collection Time: 01/12/22 11:45 PM  Result Value Ref Range   Lactic Acid, Venous 1.5 0.5 - 1.9 mmol/L    Comment: Performed at [facility], [address], [place], [place] [address]  Lactic  acid, plasma     Status: None   Collection Time: 01/13/22  5:45 AM  Result Value Ref Range   Lactic Acid, Venous 1.5 0.5 - 1.9 mmol/L    Comment: Performed at [facility], [address], [place], [place] [address]    DG CHEST PORT 1 VIEW  Result Date: 01/13/2022 CLINICAL DATA:  NG tube placement EXAM: PORTABLE CHEST 1 VIEW COMPARISON:  01/12/2022 FINDINGS: Esophagogastric tube with tip and side port below the diaphragm. The heart size and mediastinal contours are within normal limits. Both lungs are clear. The visualized skeletal structures are unremarkable. IMPRESSION: Esophagogastric tube with tip and side port below the diaphragm. Electronically Signed   By: Delanna Ahmadi M.D.   On: 01/13/2022 12:11   DG Chest  Port 1 View  Result Date: 01/12/2022 CLINICAL DATA:  NG tube placement EXAM: PORTABLE CHEST 1 VIEW COMPARISON:  Chest x-ray 06/17/2020.  Abdominal x-ray 01/12/2022. FINDINGS: Nasogastric tube tip is in the gastric fundus. Lungs are clear. There is no pleural effusion or pneumothorax. The cardiomediastinal silhouette is within normal limits. No acute fractures are seen. IMPRESSION: Nasogastric tube tip is in the gastric fundus. Electronically Signed   By: Ronney Asters M.D.   On: 01/12/2022 21:34   DG Abd Portable 1 View  Result Date: 01/12/2022 CLINICAL DATA:  NGT placement EXAM: PORTABLE ABDOMEN - 1 VIEW COMPARISON:  12/01/2021. FINDINGS: Gaseous distention of the small bowel loops overlying the upper abdomen. No NG tube is seen on this examination. IMPRESSION: Dilated bowel consistent with ileus or obstruction. If there is an NG tube in place, it is not visualized. Electronically Signed   By: Sammie Bench M.D.   On: 01/12/2022 19:55   CT Abdomen Pelvis W Contrast  Result Date: 01/12/2022 CLINICAL DATA:  Bowel obstruction suspected EXAM: CT ABDOMEN AND PELVIS WITH CONTRAST TECHNIQUE: Multidetector CT imaging of the abdomen and pelvis was performed using the standard protocol following bolus administration of intravenous contrast. RADIATION DOSE REDUCTION: This exam was performed according to the departmental dose-optimization program which includes automated exposure control, adjustment of the mA and/or kV according to patient size and/or use of iterative reconstruction technique. CONTRAST:  145m OMNIPAQUE IOHEXOL 300 MG/ML  SOLN COMPARISON:  None Available. FINDINGS: Lower chest: No acute abnormality.  Coronary artery calcification. Hepatobiliary: No focal liver abnormality. No gallstones, gallbladder wall thickening, or pericholecystic fluid. No biliary dilatation. Pancreas: No focal lesion. Normal pancreatic contour. No surrounding inflammatory changes. No main pancreatic ductal dilatation.  Spleen: Normal in size without focal abnormality. Adrenals/Urinary Tract: No adrenal nodule bilaterally. Bilateral kidneys enhance symmetrically. Bilateral fluid density lesions likely represent simple renal cysts measuring up to 12 cm on the left. Right nephrolithiasis measuring up to 2 mm. No left nephrolithiasis. No ureterolithiasis bilaterally. Right ureteral stent with proximal pigtail at the right ureteropelvic junction and distal pigtail within the urinary bladder lumen. Associated urothelial thickening of the right ureter and mild right hydronephrosis. No left hydroureteronephrosis. The urinary bladder is decompressed with Foley catheter tip and balloon terminating within the urinary bladder lumen. On delayed imaging, there is no urothelial wall thickening and there are no filling defects in the opacified portions of the bilateral collecting systems or ureters. Stomach/Bowel: Stomach is within normal limits. Gastric, proximal small bowel, as well as mid to distal small bowel fluid dilatation with air-fluid levels. Transition point noted within the right mid abdomen (2:46, 6:59). Associated small bowel mesenteric edema. No pneumatosis. No evidence of large bowel wall thickening or dilatation.  Appendix appears normal. Vascular/Lymphatic: No abdominal aorta or iliac aneurysm. Severe atherosclerotic plaque of the aorta and its branches. No abdominal, pelvic, or inguinal lymphadenopathy. Reproductive: Enlarged prostate measuring up to 4.6 cm with mass effect on the posterior wall of the urinary bladder. Other: No intraperitoneal free fluid. No intraperitoneal free gas. No organized fluid collection. Musculoskeletal: No abdominal wall hernia or abnormality. Diffusely decreased bone density. No suspicious lytic or blastic osseous lesions. No acute displaced fracture. Multilevel degenerative changes of the spine. Multilevel chronic vertebral body height loss involving the T12 and L1 levels. Posterolateral and  interbody surgical hardware fusion of the L4-S1 levels. IMPRESSION: 1. High-grade small-bowel obstruction with a transition point within the right mid abdomen. Associated mesenteric edema. No pneumatosis. Underlying internal hernia is not fully excluded. No perforation. 2. Mildly obstructive right ureteral stent: proximal pigtail at the right ureteropelvic junction with associated urothelial thickening of the right ureter and mild right hydronephrosis. Recommend urologic consultation. Correlate with urinalysis for superimposed infection. 3. Nonobstructive right nephrolithiasis measuring up to 2 mm. 4. Prostatomegaly with mass effect on the posterior wall of the urinary bladder. 5.  Aortic Atherosclerosis (ICD10-I70.0). Electronically Signed   By: Iven Finn M.D.   On: 01/12/2022 17:55     Assessment & Plan:  Luke Foster is a 71 y.o. male who was admitted with small bowel obstruction.  CT and blood work evaluated by myself.  -I explained the pathophysiology of bowel obstructions, and the different treatment regimens.  I thoroughly discussed conservative management, need for operative intervention if conservative measures fail, and need for operative invention if patient chose to not proceed with conservative measures.  We discussed that conservative measures entailed NPO, NG tube placement on LIS, IV fluids, and pain control. -After careful consideration, the patient and his brother have decided to proceed with conservative management -NPO -NG tube replaced, on LIS -IV fluids -Dulcolax suppositories ordered -Also ordered scheduled Tylenol and Robaxin to help with patient's pain control. PRN fentanyl ordered by hospitalist -PRN antiemetics -Will plan for small bowel obstruction protocol, likely tomorrow -Recommend daily blood work for electrolyte monitoring -Also recommend possible consultation of urology given concern for possible right ureteral stent obstruction on CT scan -Appreciate  hospitalist recommendations  All questions were answered to the satisfaction of the patient.  Greater than 60 minutes were spent counseling the patient and coordinating his care for his small bowel obstruction.  -- Graciella Freer, DO [facility] [address], [place] [address] [phone] (office)

## 2022-01-14 ENCOUNTER — Inpatient Hospital Stay (HOSPITAL_COMMUNITY): Payer: Medicare Other

## 2022-01-14 DIAGNOSIS — K56609 Unspecified intestinal obstruction, unspecified as to partial versus complete obstruction: Secondary | ICD-10-CM | POA: Diagnosis not present

## 2022-01-14 LAB — CBC
HCT: 37.5 % — ABNORMAL LOW (ref 39.0–52.0)
Hemoglobin: 11.7 g/dL — ABNORMAL LOW (ref 13.0–17.0)
MCH: 29 pg (ref 26.0–34.0)
MCHC: 31.2 g/dL (ref 30.0–36.0)
MCV: 93.1 fL (ref 80.0–100.0)
Platelets: 295 10*3/uL (ref 150–400)
RBC: 4.03 MIL/uL — ABNORMAL LOW (ref 4.22–5.81)
RDW: 13.3 % (ref 11.5–15.5)
WBC: 10.4 10*3/uL (ref 4.0–10.5)
nRBC: 0 % (ref 0.0–0.2)

## 2022-01-14 LAB — COMPREHENSIVE METABOLIC PANEL
ALT: 18 U/L (ref 0–44)
AST: 20 U/L (ref 15–41)
Albumin: 3.4 g/dL — ABNORMAL LOW (ref 3.5–5.0)
Alkaline Phosphatase: 69 U/L (ref 38–126)
Anion gap: 9 (ref 5–15)
BUN: 18 mg/dL (ref 8–23)
CO2: 23 mmol/L (ref 22–32)
Calcium: 9.1 mg/dL (ref 8.9–10.3)
Chloride: 107 mmol/L (ref 98–111)
Creatinine, Ser: 1.07 mg/dL (ref 0.61–1.24)
GFR, Estimated: >60 mL/min (ref >60)
Glucose, Bld: 90 mg/dL (ref 70–99)
Potassium: 3.7 mmol/L (ref 3.5–5.1)
Sodium: 139 mmol/L (ref 135–145)
Total Bilirubin: 0.8 mg/dL (ref 0.3–1.2)
Total Protein: 7 g/dL (ref 6.5–8.1)

## 2022-01-14 MED ORDER — DIATRIZOATE MEGLUMINE & SODIUM 66-10 % PO SOLN
ORAL | Status: AC
  Filled 2022-01-14: qty 90

## 2022-01-14 MED ORDER — DIATRIZOATE MEGLUMINE & SODIUM 66-10 % PO SOLN
ORAL | Status: AC
  Administered 2022-01-14: 90 mL via NASOGASTRIC
  Filled 2022-01-14: qty 90

## 2022-01-14 MED ORDER — DIATRIZOATE MEGLUMINE & SODIUM 66-10 % PO SOLN
90.0000 mL | Freq: Once | ORAL | Status: AC
  Filled 2022-01-14: qty 90

## 2022-01-14 MED ORDER — DIATRIZOATE MEGLUMINE & SODIUM 66-10 % PO SOLN
90.0000 mL | Freq: Once | ORAL | Status: AC
  Administered 2022-01-14: 90 mL via NASOGASTRIC
  Filled 2022-01-14: qty 90

## 2022-01-14 NOTE — Progress Notes (Signed)
[facility] Progress Note     Subjective: Patient seen and examined.  He is resting comfortably in bed.  NG tube remains in place with 1.1 L of brown bilious/gastric output in the last 24 hours.  He states that he feels about the same as he did yesterday.  Denies nausea and vomiting.  He denies any significant abdominal pain.  He had multiple bowel movements overnight, and confirms passing flatus this morning.  Objective: Vital signs in last 24 hours: Temp:  [97.9 F (36.6 C)-98.3 F (36.8 C)] 98.3 F (36.8 C) (12/06 0535) Pulse Rate:  [71-97] 97 (12/06 0535) Resp:  [16-20] 20 (12/06 0535) BP: (130-138)/(70-83) 133/83 (12/06 0535) SpO2:  [95 %-99 %] 95 % (12/06 0535) Last BM Date : 01/13/22  Intake/Output from previous day: 12/05 0701 - 12/06 0700 In: 125.3 [IV Piggyback:125.3] Out: 2050 [Urine:950; Emesis/NG output:1100] Intake/Output this shift: No intake/output data recorded.  General appearance: alert, cooperative, and no distress Nose: NG tube remains in place with bilious/gastric output in canister GI: Abdomen soft, minimal distention, no percussion tenderness, nontender to palpation; no rigidity, guarding, rebound tenderness  Lab Results:  Recent Labs    01/12/22 1556 01/14/22 0508  WBC 14.8* 10.4  HGB 12.6* 11.7*  HCT 39.8 37.5*  PLT 359 295   BMET Recent Labs    01/12/22 1556 01/14/22 0508  NA 137 139  K 3.7 3.7  CL 103 107  CO2 23 23  GLUCOSE 117* 90  BUN 18 18  CREATININE 1.20 1.07  CALCIUM 9.8 9.1   PT/INR No results for input(s): "LABPROT", "INR" in the last 72 hours.  Studies/Results: DG CHEST PORT 1 VIEW  Result Date: 01/13/2022 CLINICAL DATA:  NG tube placement EXAM: PORTABLE CHEST 1 VIEW COMPARISON:  01/12/2022 FINDINGS: Esophagogastric tube with tip and side port below the diaphragm. The heart size and mediastinal contours are within normal limits. Both lungs are clear. The visualized skeletal structures are unremarkable.  IMPRESSION: Esophagogastric tube with tip and side port below the diaphragm. Electronically Signed   By: Delanna Ahmadi M.D.   On: 01/13/2022 12:11   DG Chest Port 1 View  Result Date: 01/12/2022 CLINICAL DATA:  NG tube placement EXAM: PORTABLE CHEST 1 VIEW COMPARISON:  Chest x-ray 06/17/2020.  Abdominal x-ray 01/12/2022. FINDINGS: Nasogastric tube tip is in the gastric fundus. Lungs are clear. There is no pleural effusion or pneumothorax. The cardiomediastinal silhouette is within normal limits. No acute fractures are seen. IMPRESSION: Nasogastric tube tip is in the gastric fundus. Electronically Signed   By: Ronney Asters M.D.   On: 01/12/2022 21:34   DG Abd Portable 1 View  Result Date: 01/12/2022 CLINICAL DATA:  NGT placement EXAM: PORTABLE ABDOMEN - 1 VIEW COMPARISON:  12/01/2021. FINDINGS: Gaseous distention of the small bowel loops overlying the upper abdomen. No NG tube is seen on this examination. IMPRESSION: Dilated bowel consistent with ileus or obstruction. If there is an NG tube in place, it is not visualized. Electronically Signed   By: Sammie Bench M.D.   On: 01/12/2022 19:55   CT Abdomen Pelvis W Contrast  Result Date: 01/12/2022 CLINICAL DATA:  Bowel obstruction suspected EXAM: CT ABDOMEN AND PELVIS WITH CONTRAST TECHNIQUE: Multidetector CT imaging of the abdomen and pelvis was performed using the standard protocol following bolus administration of intravenous contrast. RADIATION DOSE REDUCTION: This exam was performed according to the departmental dose-optimization program which includes automated exposure control, adjustment of the mA and/or kV according to patient size and/or  use of iterative reconstruction technique. CONTRAST:  180m OMNIPAQUE IOHEXOL 300 MG/ML  SOLN COMPARISON:  None Available. FINDINGS: Lower chest: No acute abnormality.  Coronary artery calcification. Hepatobiliary: No focal liver abnormality. No gallstones, gallbladder wall thickening, or pericholecystic  fluid. No biliary dilatation. Pancreas: No focal lesion. Normal pancreatic contour. No surrounding inflammatory changes. No main pancreatic ductal dilatation. Spleen: Normal in size without focal abnormality. Adrenals/Urinary Tract: No adrenal nodule bilaterally. Bilateral kidneys enhance symmetrically. Bilateral fluid density lesions likely represent simple renal cysts measuring up to 12 cm on the left. Right nephrolithiasis measuring up to 2 mm. No left nephrolithiasis. No ureterolithiasis bilaterally. Right ureteral stent with proximal pigtail at the right ureteropelvic junction and distal pigtail within the urinary bladder lumen. Associated urothelial thickening of the right ureter and mild right hydronephrosis. No left hydroureteronephrosis. The urinary bladder is decompressed with Foley catheter tip and balloon terminating within the urinary bladder lumen. On delayed imaging, there is no urothelial wall thickening and there are no filling defects in the opacified portions of the bilateral collecting systems or ureters. Stomach/Bowel: Stomach is within normal limits. Gastric, proximal small bowel, as well as mid to distal small bowel fluid dilatation with air-fluid levels. Transition point noted within the right mid abdomen (2:46, 6:59). Associated small bowel mesenteric edema. No pneumatosis. No evidence of large bowel wall thickening or dilatation. Appendix appears normal. Vascular/Lymphatic: No abdominal aorta or iliac aneurysm. Severe atherosclerotic plaque of the aorta and its branches. No abdominal, pelvic, or inguinal lymphadenopathy. Reproductive: Enlarged prostate measuring up to 4.6 cm with mass effect on the posterior wall of the urinary bladder. Other: No intraperitoneal free fluid. No intraperitoneal free gas. No organized fluid collection. Musculoskeletal: No abdominal wall hernia or abnormality. Diffusely decreased bone density. No suspicious lytic or blastic osseous lesions. No acute displaced  fracture. Multilevel degenerative changes of the spine. Multilevel chronic vertebral body height loss involving the T12 and L1 levels. Posterolateral and interbody surgical hardware fusion of the L4-S1 levels. IMPRESSION: 1. High-grade small-bowel obstruction with a transition point within the right mid abdomen. Associated mesenteric edema. No pneumatosis. Underlying internal hernia is not fully excluded. No perforation. 2. Mildly obstructive right ureteral stent: proximal pigtail at the right ureteropelvic junction with associated urothelial thickening of the right ureter and mild right hydronephrosis. Recommend urologic consultation. Correlate with urinalysis for superimposed infection. 3. Nonobstructive right nephrolithiasis measuring up to 2 mm. 4. Prostatomegaly with mass effect on the posterior wall of the urinary bladder. 5.  Aortic Atherosclerosis (ICD10-I70.0). Electronically Signed   By: MIven FinnM.D.   On: 01/12/2022 17:55    Anti-infectives: Anti-infectives (From admission, onward)    None       Assessment/Plan:  Patient is a 71year old male who was admitted with small bowel obstruction.  -While patient states he has return of bowel function, will still obtain small bowel obstruction protocol today to confirm resolution of obstruction -NPO -NG tube to LIS, except while clamped for small bowel obstruction protocol or medication administration -IV fluids -Continue Dulcolax suppositories -PRN antiemetics -Patient with scheduled Tylenol and Robaxin in addition to as needed fentanyl -Leukocytosis resolved -Appreciate hospitalist recommendations   LOS: 2 days    Maryellen Dowdle A Chassity Ludke 01/14/2022

## 2022-01-14 NOTE — Progress Notes (Signed)
[facility]  Patient underwent 8-hour small bowel obstruction protocol x-ray this evening.  There was no contrast noted within his small bowel.  He is tentatively scheduled for surgery tomorrow.  I have ordered for an additional dose of Gastrografin to be administered tonight with a repeat x-ray in 8 hours to reevaluate for bowel obstruction, as I suspect his contrast was sucked out through the NG tube based on there being no contrast within his small bowel.  Discussed with nurse, and will clamp NG tube for 4 hours post administration.  Please make sure that the patient is not laying flat during this clamp period.  If contrast is again not present on repeat image or contrast fails to reach the colon, we will plan to proceed to the operating room for surgical intervention.  Graciella Freer, DO [facility] [address] [place], [place] [address] [phone] (office)

## 2022-01-14 NOTE — Progress Notes (Signed)
PROGRESS NOTE   Luke Foster, is a 71 y.o. male, DOB - 03-Dec-1950, DJM:[id number]  Admit date - 01/12/2022   Admitting Physician Bernadette Hoit, DO  Outpatient Primary MD for the patient is [name], MD  LOS - 2  Chief Complaint  Patient presents with   Abdominal Pain      Brief Narrative:   71 y.o. male with medical history significant of  chronic back and neck pain with spinal cord stimulator, lumbosacral radiculopathy with spinal cord stimulator, history of nephrolithiasis, history of bilateral hydronephrosis, hyperlipidemia, neurogenic bladder with indwelling Foley, chronic constipation admitted 01/02/2022 with concerns about small bowel obstruction and mildly obstructed right ureteral stent with mild right hydronephrosis    -Assessment and Plan: 1)SBO--- NG tube came out, it has been replaced -  Gastrografin small bowel protocol on 01/14/2022 -management per gen surg   2)Rt sided Hydronephrosis-CT abdomen and pelvis shows Right ureteral stent with proximal pigtail at the right ureteropelvic junction and distal pigtail within the urinary bladder lumen. Associated urothelial thickening of the right ureter and mild right hydronephrosis. -Patient was scheduled to see Dr. Gloriann Loan at [facility]rology in [place] on 01/13/2022 -Discussed with on-call urologist Dr. Alyson Ingles who recommend outpatient follow up with Dr Gloriann Loan --Continue Foley for now which is placed 9 days ago   3)Chronic back pain with lumbosacral radiculopathy --chronic opiate use -Switch to IV fentanyl as needed while NG tube in situ  4)Depression and anxiety---may use as needed IV lorazepam until oral intake resumes at which point may switch back to Effexor  5)HTN--okay to use IV labetalol as needed elevated BP -May switch back to oral Cardizem when oral intake resumes  Status is: Inpatient   Disposition: The patient is from: Home              Anticipated d/c is to: Home              Anticipated d/c date is:  TBD, need to be able to advance diet before d/c              Patient currently is not medically stable to d/c.   Code Status :  -  Code Status: Prior   Family Communication:   none at bedside  DVT Prophylaxis  :   - SCDs  SCDs Start: 01/13/22 1318   Lab Results  Component Value Date   PLT 295 01/14/2022   Inpatient Medications  Scheduled Meds:  acetaminophen  1,000 mg Oral Q6H   bisacodyl  10 mg Rectal Daily   Chlorhexidine Gluconate Cloth  6 each Topical Daily   diatrizoate meglumine-sodium       diltiazem  120 mg Oral QPM   venlafaxine XR  75 mg Oral Q breakfast   Continuous Infusions:  methocarbamol (ROBAXIN) IV 500 mg (01/14/22 1622)   PRN Meds:.diatrizoate meglumine-sodium, fentaNYL (SUBLIMAZE) injection, labetalol, LORazepam, rOPINIRole, traZODone   Anti-infectives (From admission, onward)    None       Subjective: Luke Foster today has no fevers, no further emesis,  No chest pain,    Tolerating ng suction  Abd "sore" this morning, currently on ab pain, requests pain meds for chronic bilateral leg neuropathic pain + flatus,    Objective: Vitals:   01/13/22 0452 01/13/22 1418 01/13/22 2235 01/14/22 0535  BP: 125/78 138/79 130/70 133/83  Pulse: 86 79 71 97  Resp: '18 20 16 20  '$ Temp: 97.6 F (36.4 C) 97.9 F (36.6 C) 98 F (36.7 C) 98.3 F (36.8  C)  TempSrc: Oral Oral    SpO2: 100% 99% 99% 95%  Weight:      Height:        Intake/Output Summary (Last 24 hours) at 01/14/2022 1903 Last data filed at 01/14/2022 1851 Gross per 24 hour  Intake 70.34 ml  Output 2950 ml  Net -2879.66 ml   Filed Weights   01/12/22 1424  Weight: 95.3 kg   Physical Exam  Gen:- Awake Alert, in no acute distress HEENT:- [place].AT, No sclera icterus Nose- NG tube in situ Neck-Supple Neck,No JVD,.  Lungs-  CTAB , fair symmetrical air movement CV- S1, S2 normal, regular  Abd-diminished bowel sounds, discomfort with palpation, not particularly distended, extremity/Skin:- No   edema, pedal pulses present  Psych-affect is appropriate, oriented x3 Neuro-chronic neuromuscular deficits especially of the right lower extremity , no additional new focal deficits, no tremors  Data Reviewed: I have personally reviewed following labs and imaging studies  CBC: Recent Labs  Lab 01/12/22 1556 01/14/22 0508  WBC 14.8* 10.4  NEUTROABS 12.9*  --   HGB 12.6* 11.7*  HCT 39.8 37.5*  MCV 92.3 93.1  PLT 359 622   Basic Metabolic Panel: Recent Labs  Lab 01/12/22 1556 01/14/22 0508  NA 137 139  K 3.7 3.7  CL 103 107  CO2 23 23  GLUCOSE 117* 90  BUN 18 18  CREATININE 1.20 1.07  CALCIUM 9.8 9.1   GFR: Estimated Creatinine Clearance: 75.5 mL/min (by C-G formula based on SCr of 1.07 mg/dL). Liver Function Tests: Recent Labs  Lab 01/12/22 1556 01/14/22 0508  AST 26 20  ALT 24 18  ALKPHOS 80 69  BILITOT 0.6 0.8  PROT 8.0 7.0  ALBUMIN 3.9 3.4*   Radiology Studies: DG Abd Portable 1V-Small Bowel Protocol-Position Verification  Result Date: 01/14/2022 CLINICAL DATA:  Nasogastric tube placement, dilated small bowel question small-bowel obstruction EXAM: PORTABLE ABDOMEN - 1 VIEW COMPARISON:  Portable exam 1023 hours compared to 01/12/2022 FINDINGS: Nasogastric tube projects over proximal stomach, proximal side-port at or near GE junction region. Numerous dilated loops of small bowel in the mid and upper abdomen. Relative decrease in amount of colon gas versus small bowel consistent with small bowel obstruction. No bowel wall thickening. RIGHT ureteral stent and postsurgical changes of lower lumbar fusion noted. Bones demineralized. IMPRESSION: Increased small bowel dilatation likely representing small bowel obstruction. Electronically Signed   By: Lavonia Dana M.D.   On: 01/14/2022 10:47   DG CHEST PORT 1 VIEW  Result Date: 01/13/2022 CLINICAL DATA:  NG tube placement EXAM: PORTABLE CHEST 1 VIEW COMPARISON:  01/12/2022 FINDINGS: Esophagogastric tube with tip and side  port below the diaphragm. The heart size and mediastinal contours are within normal limits. Both lungs are clear. The visualized skeletal structures are unremarkable. IMPRESSION: Esophagogastric tube with tip and side port below the diaphragm. Electronically Signed   By: Delanna Ahmadi M.D.   On: 01/13/2022 12:11   DG Chest Port 1 View  Result Date: 01/12/2022 CLINICAL DATA:  NG tube placement EXAM: PORTABLE CHEST 1 VIEW COMPARISON:  Chest x-ray 06/17/2020.  Abdominal x-ray 01/12/2022. FINDINGS: Nasogastric tube tip is in the gastric fundus. Lungs are clear. There is no pleural effusion or pneumothorax. The cardiomediastinal silhouette is within normal limits. No acute fractures are seen. IMPRESSION: Nasogastric tube tip is in the gastric fundus. Electronically Signed   By: Ronney Asters M.D.   On: 01/12/2022 21:34   DG Abd Portable 1 View  Result Date: 01/12/2022 CLINICAL DATA:  NGT placement EXAM: PORTABLE ABDOMEN - 1 VIEW COMPARISON:  12/01/2021. FINDINGS: Gaseous distention of the small bowel loops overlying the upper abdomen. No NG tube is seen on this examination. IMPRESSION: Dilated bowel consistent with ileus or obstruction. If there is an NG tube in place, it is not visualized. Electronically Signed   By: Sammie Bench M.D.   On: 01/12/2022 19:55    Scheduled Meds:  acetaminophen  1,000 mg Oral Q6H   bisacodyl  10 mg Rectal Daily   Chlorhexidine Gluconate Cloth  6 each Topical Daily   diatrizoate meglumine-sodium       diltiazem  120 mg Oral QPM   venlafaxine XR  75 mg Oral Q breakfast   Continuous Infusions:  methocarbamol (ROBAXIN) IV 500 mg (01/14/22 1622)     LOS: 2 days   Florencia Reasons M.D PhD FACP on 01/14/2022 at 7:03 PM  Go to [email or web address] - for contact info  [facility] - Office  [phone]  If 7PM-7AM, please contact night-coverage [email or web address] 01/14/2022, 7:03 PM

## 2022-01-15 ENCOUNTER — Inpatient Hospital Stay (HOSPITAL_COMMUNITY): Payer: Medicare Other | Admitting: Anesthesiology

## 2022-01-15 ENCOUNTER — Other Ambulatory Visit: Payer: Self-pay

## 2022-01-15 ENCOUNTER — Inpatient Hospital Stay (HOSPITAL_COMMUNITY): Payer: Medicare Other

## 2022-01-15 ENCOUNTER — Encounter (HOSPITAL_COMMUNITY)
Admission: EM | Disposition: A | Discharge status: Home / Self Care | Payer: Self-pay | Source: Home / Self Care | Attending: Internal Medicine

## 2022-01-15 DIAGNOSIS — I10 Essential (primary) hypertension: Secondary | ICD-10-CM

## 2022-01-15 DIAGNOSIS — Z87891 Personal history of nicotine dependence: Secondary | ICD-10-CM

## 2022-01-15 DIAGNOSIS — K56609 Unspecified intestinal obstruction, unspecified as to partial versus complete obstruction: Secondary | ICD-10-CM | POA: Diagnosis not present

## 2022-01-15 DIAGNOSIS — G4733 Obstructive sleep apnea (adult) (pediatric): Secondary | ICD-10-CM

## 2022-01-15 DIAGNOSIS — Z9989 Dependence on other enabling machines and devices: Secondary | ICD-10-CM

## 2022-01-15 HISTORY — PX: BOWEL RESECTION: SHX1257

## 2022-01-15 HISTORY — PX: LAPAROTOMY: SHX154

## 2022-01-15 LAB — TYPE AND SCREEN
ABO/RH(D): A POS
Antibody Screen: NEGATIVE

## 2022-01-15 LAB — BASIC METABOLIC PANEL
Anion gap: 12 (ref 5–15)
BUN: 23 mg/dL (ref 8–23)
CO2: 26 mmol/L (ref 22–32)
Calcium: 9.2 mg/dL (ref 8.9–10.3)
Chloride: 103 mmol/L (ref 98–111)
Creatinine, Ser: 1.13 mg/dL (ref 0.61–1.24)
GFR, Estimated: >60 mL/min (ref >60)
Glucose, Bld: 99 mg/dL (ref 70–99)
Potassium: 3.2 mmol/L — ABNORMAL LOW (ref 3.5–5.1)
Sodium: 141 mmol/L (ref 135–145)

## 2022-01-15 LAB — URINE CULTURE: Culture: >=100000 — AB

## 2022-01-15 LAB — CBC
HCT: 39 % (ref 39.0–52.0)
Hemoglobin: 12.3 g/dL — ABNORMAL LOW (ref 13.0–17.0)
MCH: 29.1 pg (ref 26.0–34.0)
MCHC: 31.5 g/dL (ref 30.0–36.0)
MCV: 92.4 fL (ref 80.0–100.0)
Platelets: 311 10*3/uL (ref 150–400)
RBC: 4.22 MIL/uL (ref 4.22–5.81)
RDW: 13.2 % (ref 11.5–15.5)
WBC: 11.5 10*3/uL — ABNORMAL HIGH (ref 4.0–10.5)
nRBC: 0 % (ref 0.0–0.2)

## 2022-01-15 LAB — MAGNESIUM: Magnesium: 2.3 mg/dL (ref 1.7–2.4)

## 2022-01-15 SURGERY — LAPAROTOMY, EXPLORATORY
Anesthesia: General | Site: Abdomen

## 2022-01-15 MED ORDER — KETAMINE HCL-SODIUM CHLORIDE 100-0.9 MG/10ML-% IV SOSY
PREFILLED_SYRINGE | INTRAVENOUS | Status: DC | PRN
  Administered 2022-01-15: 10 mg via INTRAVENOUS
  Administered 2022-01-15: 40 mg via INTRAVENOUS

## 2022-01-15 MED ORDER — LACTATED RINGERS IV SOLN: INTRAVENOUS | Status: DC

## 2022-01-15 MED ORDER — CHLORHEXIDINE GLUCONATE 0.12 % MT SOLN
15.0000 mL | Freq: Once | OROMUCOSAL | Status: AC
  Administered 2022-01-15: 15 mL via OROMUCOSAL

## 2022-01-15 MED ORDER — FENTANYL CITRATE (PF) 100 MCG/2ML IJ SOLN
INTRAMUSCULAR | Status: DC | PRN
  Administered 2022-01-15 (×6): 50 ug via INTRAVENOUS

## 2022-01-15 MED ORDER — LIDOCAINE HCL (CARDIAC) PF 100 MG/5ML IV SOSY
PREFILLED_SYRINGE | INTRAVENOUS | Status: DC | PRN
  Administered 2022-01-15: 100 mg via INTRAVENOUS

## 2022-01-15 MED ORDER — SUCCINYLCHOLINE CHLORIDE 200 MG/10ML IV SOSY
PREFILLED_SYRINGE | INTRAVENOUS | Status: DC | PRN
  Administered 2022-01-15: 120 mg via INTRAVENOUS

## 2022-01-15 MED ORDER — OXYCODONE HCL 5 MG PO TABS
5.0000 mg | ORAL_TABLET | ORAL | Status: DC | PRN
  Administered 2022-01-15 – 2022-01-17 (×8): 5 mg via ORAL
  Filled 2022-01-15 (×9): qty 1

## 2022-01-15 MED ORDER — ACETAMINOPHEN 10 MG/ML IV SOLN
1000.0000 mg | Freq: Once | INTRAVENOUS | Status: AC
  Administered 2022-01-15: 1000 mg via INTRAVENOUS

## 2022-01-15 MED ORDER — HYDROMORPHONE HCL 1 MG/ML IJ SOLN
INTRAMUSCULAR | Status: AC
  Filled 2022-01-15: qty 0.5

## 2022-01-15 MED ORDER — ONDANSETRON HCL 4 MG/2ML IJ SOLN: 4.0000 mg | Freq: Once | INTRAMUSCULAR | Status: DC | PRN

## 2022-01-15 MED ORDER — FENTANYL CITRATE (PF) 100 MCG/2ML IJ SOLN
INTRAMUSCULAR | Status: AC
  Filled 2022-01-15: qty 2

## 2022-01-15 MED ORDER — KETAMINE HCL 50 MG/5ML IJ SOSY
PREFILLED_SYRINGE | INTRAMUSCULAR | Status: AC
  Filled 2022-01-15: qty 5

## 2022-01-15 MED ORDER — ACETAMINOPHEN 10 MG/ML IV SOLN
INTRAVENOUS | Status: AC
  Filled 2022-01-15: qty 100

## 2022-01-15 MED ORDER — DEXAMETHASONE SODIUM PHOSPHATE 10 MG/ML IJ SOLN
INTRAMUSCULAR | Status: DC | PRN
  Administered 2022-01-15: 10 mg via INTRAVENOUS

## 2022-01-15 MED ORDER — CEFAZOLIN SODIUM-DEXTROSE 2-4 GM/100ML-% IV SOLN
2.0000 g | INTRAVENOUS | Status: AC
  Administered 2022-01-15: 2 g via INTRAVENOUS

## 2022-01-15 MED ORDER — SUGAMMADEX SODIUM 200 MG/2ML IV SOLN
INTRAVENOUS | Status: DC | PRN
  Administered 2022-01-15: 200 mg via INTRAVENOUS

## 2022-01-15 MED ORDER — MEPERIDINE HCL 50 MG/ML IJ SOLN: 6.2500 mg | INTRAMUSCULAR | Status: DC | PRN

## 2022-01-15 MED ORDER — SODIUM CHLORIDE 0.9 % IR SOLN
Status: DC | PRN
  Administered 2022-01-15 (×2): 1000 mL

## 2022-01-15 MED ORDER — BUPIVACAINE LIPOSOME 1.3 % IJ SUSP
INTRAMUSCULAR | Status: AC
  Filled 2022-01-15: qty 20

## 2022-01-15 MED ORDER — BUPIVACAINE LIPOSOME 1.3 % IJ SUSP
INTRAMUSCULAR | Status: DC | PRN
  Administered 2022-01-15: 20 mL

## 2022-01-15 MED ORDER — PROPOFOL 500 MG/50ML IV EMUL
INTRAVENOUS | Status: AC
  Filled 2022-01-15: qty 50

## 2022-01-15 MED ORDER — PROPOFOL 10 MG/ML IV BOLUS
INTRAVENOUS | Status: DC | PRN
  Administered 2022-01-15: 120 mg via INTRAVENOUS

## 2022-01-15 MED ORDER — PHENYLEPHRINE 80 MCG/ML (10ML) SYRINGE FOR IV PUSH (FOR BLOOD PRESSURE SUPPORT)
PREFILLED_SYRINGE | INTRAVENOUS | Status: AC
  Filled 2022-01-15: qty 10

## 2022-01-15 MED ORDER — SUCCINYLCHOLINE CHLORIDE 200 MG/10ML IV SOSY
PREFILLED_SYRINGE | INTRAVENOUS | Status: AC
  Filled 2022-01-15: qty 10

## 2022-01-15 MED ORDER — ROCURONIUM BROMIDE 100 MG/10ML IV SOLN
INTRAVENOUS | Status: DC | PRN
  Administered 2022-01-15: 40 mg via INTRAVENOUS
  Administered 2022-01-15: 10 mg via INTRAVENOUS

## 2022-01-15 MED ORDER — PHENYLEPHRINE 80 MCG/ML (10ML) SYRINGE FOR IV PUSH (FOR BLOOD PRESSURE SUPPORT)
PREFILLED_SYRINGE | INTRAVENOUS | Status: DC | PRN
  Administered 2022-01-15: 80 ug via INTRAVENOUS
  Administered 2022-01-15 (×2): 160 ug via INTRAVENOUS

## 2022-01-15 MED ORDER — LACTATED RINGERS IV SOLN: INTRAVENOUS | Status: DC | PRN

## 2022-01-15 MED ORDER — HYDROMORPHONE HCL 1 MG/ML IJ SOLN
0.5000 mg | INTRAMUSCULAR | Status: DC | PRN
  Administered 2022-01-15: 0.5 mg via INTRAVENOUS

## 2022-01-15 MED ORDER — HYDROMORPHONE HCL 1 MG/ML IJ SOLN
0.2500 mg | INTRAMUSCULAR | Status: AC | PRN
  Administered 2022-01-15 (×4): 0.5 mg via INTRAVENOUS
  Filled 2022-01-15: qty 0.5

## 2022-01-15 MED ORDER — SODIUM CHLORIDE 0.9 % IV SOLN: INTRAVENOUS | Status: DC

## 2022-01-15 MED ORDER — CEFAZOLIN SODIUM-DEXTROSE 2-4 GM/100ML-% IV SOLN
INTRAVENOUS | Status: AC
  Filled 2022-01-15: qty 100

## 2022-01-15 MED ORDER — ORAL CARE MOUTH RINSE: 15.0000 mL | Freq: Once | OROMUCOSAL | Status: AC

## 2022-01-15 MED ORDER — ONDANSETRON HCL 4 MG/2ML IJ SOLN
INTRAMUSCULAR | Status: DC | PRN
  Administered 2022-01-15: 4 mg via INTRAVENOUS

## 2022-01-15 SURGICAL SUPPLY — 40 items
APL PRP STRL LF DISP 70% ISPRP (MISCELLANEOUS) ×2
CHLORAPREP W/TINT 26 (MISCELLANEOUS) ×2 IMPLANT
CLOTH BEACON ORANGE TIMEOUT ST (SAFETY) ×2 IMPLANT
COVER LIGHT HANDLE STERIS (MISCELLANEOUS) ×4 IMPLANT
DRAPE WARM FLUID 44X44 (DRAPES) ×2 IMPLANT
DRSG OPSITE POSTOP 4X10 (GAUZE/BANDAGES/DRESSINGS) IMPLANT
ELECT BLADE 6 FLAT ULTRCLN (ELECTRODE) IMPLANT
ELECT REM PT RETURN 9FT ADLT (ELECTROSURGICAL) ×2
ELECTRODE REM PT RTRN 9FT ADLT (ELECTROSURGICAL) ×2 IMPLANT
GLOVE BIO SURGEON STRL SZ7 (GLOVE) IMPLANT
GLOVE BIOGEL PI IND STRL 6.5 (GLOVE) ×2 IMPLANT
GLOVE BIOGEL PI IND STRL 7.0 (GLOVE) ×4 IMPLANT
GLOVE SURG SS PI 6.5 STRL IVOR (GLOVE) ×4 IMPLANT
GOWN STRL REUS W/TWL LRG LVL3 (GOWN DISPOSABLE) ×6 IMPLANT
HANDLE SUCTION POOLE (INSTRUMENTS) ×2 IMPLANT
INST SET MAJOR GENERAL (KITS) ×2 IMPLANT
KIT TURNOVER KIT A (KITS) ×2 IMPLANT
LIGASURE IMPACT 36 18CM CVD LR (INSTRUMENTS) IMPLANT
MANIFOLD NEPTUNE II (INSTRUMENTS) ×2 IMPLANT
NDL HYPO 21X1.5 SAFETY (NEEDLE) ×2 IMPLANT
NEEDLE HYPO 21X1.5 SAFETY (NEEDLE) ×2 IMPLANT
NS IRRIG 1000ML POUR BTL (IV SOLUTION) ×4 IMPLANT
PACK MAJOR ABDOMINAL (CUSTOM PROCEDURE TRAY) ×2 IMPLANT
PAD ARMBOARD 7.5X6 YLW CONV (MISCELLANEOUS) ×2 IMPLANT
PAD TELFA 3X4 1S STER (GAUZE/BANDAGES/DRESSINGS) IMPLANT
PENCIL SMOKE EVACUATOR COATED (MISCELLANEOUS) ×2 IMPLANT
RELOAD PROXIMATE 75MM BLUE (ENDOMECHANICALS) ×4 IMPLANT
RELOAD STAPLE 75 3.8 BLU REG (ENDOMECHANICALS) IMPLANT
SET BASIN LINEN APH (SET/KITS/TRAYS/PACK) ×2 IMPLANT
SPONGE T-LAP 18X18 ~~LOC~~+RFID (SPONGE) ×2 IMPLANT
STAPLER GUN LINEAR PROX 60 (STAPLE) IMPLANT
STAPLER PROXIMATE 75MM BLUE (STAPLE) IMPLANT
STAPLER VISISTAT (STAPLE) ×2 IMPLANT
SUCTION POOLE HANDLE (INSTRUMENTS) ×2
SUT CHROMIC 3 0 SH 27 (SUTURE) IMPLANT
SUT PDS AB CT VIOLET #0 27IN (SUTURE) ×4 IMPLANT
SUT SILK 3 0 SH CR/8 (SUTURE) ×2 IMPLANT
SUT VIC AB 3-0 SH 27 (SUTURE) ×2
SUT VIC AB 3-0 SH 27X BRD (SUTURE) IMPLANT
SYR 20ML LL LF (SYRINGE) ×4 IMPLANT

## 2022-01-15 NOTE — Anesthesia Preprocedure Evaluation (Addendum)
Anesthesia Evaluation  Patient identified by MRN, date of birth, ID band Patient awake    Reviewed: Allergy & Precautions, H&P , NPO status , Patient's Chart, lab work & pertinent test results, reviewed documented beta blocker date and time   History of Anesthesia Complications Negative for: history of anesthetic complications  Airway Mallampati: III  TM Distance: >3 FB Neck ROM: Full    Dental  (+) Dental Advisory Given, Missing, Chipped   Pulmonary sleep apnea and Continuous Positive Airway Pressure Ventilation , former smoker   Pulmonary exam normal breath sounds clear to auscultation       Cardiovascular Exercise Tolerance: Poor hypertension, Pt. on medications Normal cardiovascular exam Rhythm:Regular Rate:Normal  EKG - NSR, LBBB   Neuro/Psych  PSYCHIATRIC DISORDERS Anxiety Depression     Neuromuscular disease (Back sx, lower extremity weakness)    GI/Hepatic Neg liver ROS,,,SMALL BOWEL OBSTRUCTION   Endo/Other  negative endocrine ROS    Renal/GU Renal InsufficiencyRenal disease  negative genitourinary   Musculoskeletal  (+) Arthritis , Osteoarthritis,    Abdominal   Peds negative pediatric ROS (+)  Hematology negative hematology ROS (+)   Anesthesia Other Findings   Reproductive/Obstetrics negative OB ROS                             Anesthesia Physical Anesthesia Plan  ASA: 3  Anesthesia Plan: General   Post-op Pain Management: Dilaudid IV   Induction: Intravenous, Rapid sequence and Cricoid pressure planned  PONV Risk Score and Plan: 4 or greater and Ondansetron and Dexamethasone  Airway Management Planned: Oral ETT and Video Laryngoscope Planned  Additional Equipment:   Intra-op Plan:   Post-operative Plan: Extubation in OR and Possible Post-op intubation/ventilation  Informed Consent: I have reviewed the patients History and Physical, chart, labs and discussed the  procedure including the risks, benefits and alternatives for the proposed anesthesia with the patient or authorized representative who has indicated his/her understanding and acceptance.     Dental advisory given  Plan Discussed with: CRNA and Surgeon  Anesthesia Plan Comments:         Anesthesia Quick Evaluation

## 2022-01-15 NOTE — Transfer of Care (Signed)
Immediate Anesthesia Transfer of Care Note  Patient: Luke Foster  Procedure(s) Performed: Exploratory laparotomy, small bowel resection  Patient Location: PACU  Anesthesia Type:General  Level of Consciousness: drowsy and patient cooperative  Airway & Oxygen Therapy: Patient Spontanous Breathing and Patient connected to face mask oxygen  Post-op Assessment: Report given to RN and Post -op Vital signs reviewed and stable  Post vital signs: Reviewed and stable  Last Vitals:  Vitals Value Taken Time  BP 158/83 01/15/22 1201  Temp    Pulse 89 01/15/22 1203  Resp 25 01/15/22 1203  SpO2 100 % 01/15/22 1203  Vitals shown include unvalidated device data.  Last Pain:  Vitals:   01/15/22 0949  TempSrc: Oral  PainSc: 8       Patients Stated Pain Goal: 7 (72/07/21 8288)  Complications: No notable events documented.

## 2022-01-15 NOTE — Progress Notes (Signed)
[facility] Progress Note  Day of Surgery  Subjective: Patient seen and examined.  He is resting comfortably in bed.  He underwent small bowel obstruction protocol yesterday, and the 8-hour film demonstrated no contrast in his small bowel, so suspect it was suctioned out.  We administered another dose of Gastrografin last night and NG tube was clamped for 4 hours.  This morning's x-ray still demonstrates no contrast within the small bowel, indicating that his bowel obstruction is persisting.  He denies nausea and vomiting.  NG tube is in place with clear bilious, likely gastric output in the canister.  He denies fevers and chills.  His abdominal pain is stable.  Objective: Vital signs in last 24 hours: Temp:  [98.4 F (36.9 C)-98.8 F (37.1 C)] 98.4 F (36.9 C) (12/07 0339) Pulse Rate:  [86-101] 101 (12/07 0339) Resp:  [18-20] 18 (12/07 0339) BP: (127-138)/(84-88) 127/85 (12/07 0339) SpO2:  [95 %-100 %] 95 % (12/07 0339) Last BM Date : 01/13/22  Intake/Output from previous day: 12/06 0701 - 12/07 0700 In: 0  Out: 1200 [Urine:300; Emesis/NG output:900] Intake/Output this shift: Total I/O In: -  Out: 900 [Emesis/NG output:900]  General appearance: alert, cooperative, and no distress Nose: NG tube in place with gastric contents in canister GI: Abdomen soft, mild distention, no percussion tenderness, nontender to palpation; no rigidity, guarding, rebound tenderness  Lab Results:  Recent Labs    01/14/22 0508 01/15/22 0633  WBC 10.4 11.5*  HGB 11.7* 12.3*  HCT 37.5* 39.0  PLT 295 311   BMET Recent Labs    01/14/22 0508 01/15/22 0633  NA 139 141  K 3.7 3.2*  CL 107 103  CO2 23 26  GLUCOSE 90 99  BUN 18 23  CREATININE 1.07 1.13  CALCIUM 9.1 9.2   PT/INR No results for input(s): "LABPROT", "INR" in the last 72 hours.  Studies/Results: DG Abd Portable 1V-Small Bowel Obstruction Protocol-initial, 8 hr delay  Result Date: 01/15/2022 CLINICAL DATA:   Small bowel obstruction EXAM: PORTABLE ABDOMEN - 1 VIEW COMPARISON:  Radiographs dated January 14, 2022 FINDINGS: Enteric tube tip and side port are in the stomach. Small amount of contrast material seen in the stomach. Numerous dilated loops of small bowel, similar to prior exam. Scattered colonic gas. Unchanged position of right ureter stent. Visualized lung bases are clear. IMPRESSION: Dilated loops of small bowel, similar to prior exam. Electronically Signed   By: Yetta Glassman M.D.   On: 01/15/2022 08:56   DG Abd Portable 1V-Small Bowel Obstruction Protocol-initial, 8 hr delay  Result Date: 01/14/2022 CLINICAL DATA:  8 hour delay exam. EXAM: PORTABLE ABDOMEN - 1 VIEW COMPARISON:  Earlier radiograph dated 01/14/2022. FINDINGS: Partially visualized enteric tube in the left upper abdomen. Small amount of oral contrast noted adjacent to the tip of the tube. No oral contrast noted within the bowel loops. Persistent air distended loops of small bowel measuring up to 4.5 cm in diameter. Right-sided pigtail ureteral catheter. Osteopenia with degenerative changes and lower lumbar fusion. No acute osseous pathology. IMPRESSION: Persistent air distended loops of small bowel. Electronically Signed   By: Anner Crete M.D.   On: 01/14/2022 20:01   DG Abd Portable 1V-Small Bowel Protocol-Position Verification  Result Date: 01/14/2022 CLINICAL DATA:  Nasogastric tube placement, dilated small bowel question small-bowel obstruction EXAM: PORTABLE ABDOMEN - 1 VIEW COMPARISON:  Portable exam 1023 hours compared to 01/12/2022 FINDINGS: Nasogastric tube projects over proximal stomach, proximal side-port at or near GE junction region. Numerous  dilated loops of small bowel in the mid and upper abdomen. Relative decrease in amount of colon gas versus small bowel consistent with small bowel obstruction. No bowel wall thickening. RIGHT ureteral stent and postsurgical changes of lower lumbar fusion noted. Bones  demineralized. IMPRESSION: Increased small bowel dilatation likely representing small bowel obstruction. Electronically Signed   By: Lavonia Dana M.D.   On: 01/14/2022 10:47   DG CHEST PORT 1 VIEW  Result Date: 01/13/2022 CLINICAL DATA:  NG tube placement EXAM: PORTABLE CHEST 1 VIEW COMPARISON:  01/12/2022 FINDINGS: Esophagogastric tube with tip and side port below the diaphragm. The heart size and mediastinal contours are within normal limits. Both lungs are clear. The visualized skeletal structures are unremarkable. IMPRESSION: Esophagogastric tube with tip and side port below the diaphragm. Electronically Signed   By: Delanna Ahmadi M.D.   On: 01/13/2022 12:11    Anti-infectives: Anti-infectives (From admission, onward)    Start     Dose/Rate Route Frequency Ordered Stop   01/15/22 1015  ceFAZolin (ANCEF) IVPB 1 g/50 mL premix        1 g 100 mL/hr over 30 Minutes Intravenous  Once 01/15/22 6270         Assessment/Plan:  Patient is a 71 year old male who was admitted with a small bowel obstruction.  -Patient failed small bowel obstruction protocol imaging, as no contrast progressed outside of his stomach -Plan for exploratory laparotomy and possible small bowel resection today -NPO -NG tube to LIS -IV fluids -The risks and benefits of exploratory laparotomy and possible small bowel resection were discussed with the patient and his brother.  After careful consideration, the patient has decided to proceed with surgical intervention. -Prophylactic Kefzol ordered -Mild leukocytosis this morning of 11.5, will continue to monitor -Further recommendations to follow surgery   LOS: 3 days    Luke Foster A Anuar Walgren 01/15/2022

## 2022-01-15 NOTE — Progress Notes (Signed)
[facility]  Spoke with the patient's brother in the consultation room.  I explained that he had some abnormal scarring in the right side of his abdomen, so decision was made to resect this section of small bowel and scarring and send it to pathology for evaluation.  Given the need for small bowel resection, he will return to the floor with NG tube in place which will remain on low intermittent suction.  This will need to stay in place until he has return of bowel function.  We will give him Dulcolax suppositories and control his pain.  He will not be able to eat or drink until he has return of bowel function.  He has skin staples in place, which will be removed in 2 weeks.  All questions were answered to his expressed satisfaction.  Plan: -NG tube to LIS -NPO -IV fluids -No need for antibiotics from surgical standpoint -Dulcolax suppositories ordered -Monitor for return of bowel function -Await final pathology -PRN pain medications and antiemetics -Appreciate hospitalist recommendations  Graciella Freer, DO [facility] [address] [place], [place] [address] [phone] (office)

## 2022-01-15 NOTE — Op Note (Signed)
[facility] Operative Note  01/15/22  Preoperative Diagnosis: Small bowel obstruction   Postoperative Diagnosis: Same   Procedure(s) Performed: Exploratory laparotomy, small bowel resection   Surgeon: Graciella Freer, DO    Assistants: Curlene Labrum, MD; Marquita Palms, RNFA   Anesthesia: General endotracheal   Anesthesiologist: Dr. Charna Elizabeth   Specimens: Small bowel resection, mesenteric scarring   Estimated Blood Loss: 100 cc   Blood Replacement: None    Complications: None   Wound Class: Contaminated   Operative Indications: Patient is a 71 year old male who was admitted with a small bowel obstruction.  Small bowel obstruction protocol was attempted twice, however contrast failed to leave the stomach.  Given his failure to progress with conservative measures, decision was made to take him to the operating room for surgical intervention.  Patient and his brother are agreeable to surgery.  All risks and benefits of performing this procedure were discussed with the patient including pain, infection, bleeding, damage to the surrounding structures, and need for more procedures or surgery. The patient voiced understanding of the procedure, all questions were sought and answered, and consent was obtained.  Findings: Small bowel scarred in the right abdomen with scarring between small bowel loops and mesentery, stellate white scar present in the small bowel mesentery.  Given abnormal appearance of scar, small bowel resection was performed and additional sections of scar were sent to pathology for evaluation.  No discrete mass or enlarged lymph nodes were identified.  Remainder of small bowel appeared viable without evidence of ischemia or necrosis   Procedure: The patient was taken to the operating room and placed supine. General endotracheal anesthesia was induced. Intravenous antibiotics were administered per protocol.  NG tube had been placed prior to  proceeding to the operating room.  The abdomen was prepared and draped in the usual sterile fashion.  Timeout was performed.  A vertical midline incision was made from the epigastrium to just below the umbilicus.  This was deepened through the subcutaneous tissues, and hemostasis was achieved with electrocautery.  The linea alba was identified and incised, and the peritoneal cavity was entered.  The abdomen was explored and there were no adhesions to the abdominal wall.  The abdomen was then explored, and there were proximal dilated loops of bowel and distal decompressed bowel noted.  There were adhesions noted in the right side of the abdomen where the small bowel was adhesed to colon, and small bowel mesentery, which appeared to be the cause of the obstruction.  Upon further examination, there was a stellate white scar within the mesentery.  There were no enlarged lymph nodes or palpable masses noted.  Given that this patient has never had intra-abdominal surgery and the abnormal appearance of the scar, decision was made to perform a small bowel resection and send a piece of the scar off to pathology separately for evaluation.  Resection points were chosen both proximal and distal to the scarred area, and windows were created within the mesentery.  The bowel was divided with a linear cutting stapler at each resection margin, and the mesentery was taken down with LigaSure, taking care to include the scarred area of mesentery.  The small bowel was sent to pathology for evaluation, in addition to a section of the mesenteric scarring also being sent separately.  Enterotomies were then created and the antimesenteric borders, and a linear cutting stapler was inserted and fired.  The lumen was inspected for hemostasis.  The enterotomies were closed with a TA stapler.  The anastomosis was then inspected for patency and integrity.  3-0 silk was then used in a Lembert suture fashion to imbricate the staple line.  Two  crotch stitches were also placed.  The mesenteric defect was closed with a running locking 3-0 chromic suture.    The abdomen was then irrigated with 2 L of saline.  The bowel was run from the ligament of Treitz to the ileocecal valve, and there were no areas of ischemia or necrosis noted.  The NG tube was verified to be within the body of the stomach.  At this time, all participants in the case changed their outer set of gloves.  The sponge, needle, and instrument counts were noted to be correct.  The fascia was then closed with 0 PDS in a running fashion from both the superior and inferior aspects of the incision.  The subcutaneous tissues were then irrigated with saline.  The skin was closed with a skin stapler.  A honeycomb dressing was then applied.  Dr. Constance Haw was assisting throughout the procedure and was present for the critical portions of the case, including exploration of the abdomen, adhesiolysis, and small bowel resection.   Final inspection revealed acceptable hemostasis. All counts were correct at the end of the case. The patient was awakened from anesthesia and extubated without complication.  The patient went to the PACU in stable condition.   Graciella Freer, DO  [facility] [address] [place], [place] [address] [phone] (office)

## 2022-01-15 NOTE — Brief Op Note (Signed)
01/15/2022  12:10 PM  PATIENT:  Luke Foster  71 y.o. male  PRE-OPERATIVE DIAGNOSIS:  Small bowel obstruction  POST-OPERATIVE DIAGNOSIS:  Small bowel obstruction  PROCEDURE:  Procedure(s): Exploratory laparotomy, small bowel resection (N/A)  SURGEON:  Surgeon(s) and Role:    * Toini Failla, Flint Melter, DO - Primary    * Bridges, Lanell Matar, MD - Assisting  ASSISTANTS: Marquita Palms, RNFA   ANESTHESIA:   general  EBL:  100 mL   BLOOD ADMINISTERED:none  DRAINS: none   LOCAL MEDICATIONS USED:  BUPIVICAINE   SPECIMEN:  Source of Specimen:  small bowel resection, mesenteric scarring  DISPOSITION OF SPECIMEN:  PATHOLOGY  COUNTS:  YES  DICTATION: .Note written in EPIC  PLAN OF CARE: Admit to inpatient   PATIENT DISPOSITION:  PACU - hemodynamically stable.   Delay start of Pharmacological VTE agent (>24hrs) due to surgical blood loss or risk of bleeding: no

## 2022-01-15 NOTE — Anesthesia Procedure Notes (Signed)
Procedure Name: Intubation Date/Time: 01/15/2022 10:25 AM  Performed by: Genelle Bal, CRNAPre-anesthesia Checklist: Patient identified, Emergency Drugs available, Suction available and Patient being monitored Patient Re-evaluated:Patient Re-evaluated prior to induction Oxygen Delivery Method: Circle system utilized Preoxygenation: Pre-oxygenation with 100% oxygen Induction Type: IV induction, Rapid sequence and Cricoid Pressure applied Laryngoscope Size: Glidescope and 4 Grade View: Grade I Tube type: Oral Tube size: 7.5 mm Number of attempts: 1 Airway Equipment and Method: Stylet and Video-laryngoscopy Placement Confirmation: ETT inserted through vocal cords under direct vision, positive ETCO2 and breath sounds checked- equal and bilateral Secured at: 24 cm Tube secured with: Tape Dental Injury: Teeth and Oropharynx as per pre-operative assessment  Comments: Elective glidescope intubation (prior intubation with McGraph)

## 2022-01-15 NOTE — Progress Notes (Signed)
Gastrograffin given via NGT, clamped per protocol.  NGT to remain clamped x 4 hrs, per MD.  Pt tolerated well.

## 2022-01-15 NOTE — Progress Notes (Signed)
PROGRESS NOTE   Luke Foster, is a 71 y.o. male, DOB - December 13, 1950, [id number]  Admit date - 01/12/2022   Admitting Physician Bernadette Hoit, DO  Outpatient Primary MD for the patient is [name], MD  LOS - 3  Chief Complaint  Patient presents with   Abdominal Pain      Brief Narrative:   71 y.o. male with medical history significant of  chronic back and neck pain with spinal cord stimulator, lumbosacral radiculopathy with spinal cord stimulator, history of nephrolithiasis, history of bilateral hydronephrosis, hyperlipidemia, neurogenic bladder with indwelling Foley, chronic constipation admitted 01/02/2022 with concerns about small bowel obstruction and mildly obstructed right ureteral stent with mild right hydronephrosis    -Assessment and Plan:  1)SBO---  -s/p ex lap and resect section of small bowel and scarring ,  pathology  pending -He has skin staples in place, which will be removed in 2 weeks  -management per gen surg   2)Rt sided Hydronephrosis-CT abdomen and pelvis shows Right ureteral stent with proximal pigtail at the right ureteropelvic junction and distal pigtail within the urinary bladder lumen. Associated urothelial thickening of the right ureter and mild right hydronephrosis. -Patient was scheduled to see Dr. Gloriann Loan at [facility] urology in [place] on 01/13/2022 -Discussed with on-call urologist Dr. Alyson Ingles who recommend outpatient follow up with Dr Gloriann Loan -- last sent placed for kidney stone on 11/27, foley exchanged on 11/27   he desires to have the stent removed while he is here in the hospital, will discuss with urology on 12/8   3)FTT/ chronic physical debility  , mostly bed to wheelchair, able to walk with a walker for a short distance at home, has trapeze bar at home to help him get out of bed, he desires to have one here to help with mobility, order placed, also ordered PT --chronic opiate use -Switch to IV fentanyl as needed while NG tube in  situ  4)Depression and anxiety---may use as needed IV lorazepam until oral intake resumes at which point may switch back to Effexor  5)HTN--okay to use IV labetalol as needed elevated BP -May switch back to oral Cardizem when oral intake resumes  Status is: Inpatient   Disposition: The patient is from: Home              Anticipated d/c is to: Home, appear to have good family support              Anticipated d/c date is: TBD, need to be able to advance diet before d/c              Patient currently is not medically stable to d/c.   Code Status :  -  Code Status: Prior   Family Communication:   brother  at bedside  DVT Prophylaxis  :   - SCDs  SCDs Start: 01/13/22 1318   Lab Results  Component Value Date   PLT 311 01/15/2022   Inpatient Medications  Scheduled Meds:  acetaminophen  1,000 mg Oral Q6H   bisacodyl  10 mg Rectal Daily   Chlorhexidine Gluconate Cloth  6 each Topical Daily   diltiazem  120 mg Oral QPM   venlafaxine XR  75 mg Oral Q breakfast   Continuous Infusions:  sodium chloride 125 mL/hr at 01/15/22 1707   methocarbamol (ROBAXIN) IV 500 mg (01/15/22 1709)   PRN Meds:.fentaNYL (SUBLIMAZE) injection, labetalol, LORazepam, oxyCODONE, rOPINIRole, traZODone   Anti-infectives (From admission, onward)    Start     Dose/Rate  Route Frequency Ordered Stop   01/15/22 1007  ceFAZolin (ANCEF) 2-4 GM/100ML-% IVPB       Note to Pharmacy: Abbie Sons S: cabinet override      01/15/22 1007 01/15/22 1036   01/15/22 0930  ceFAZolin (ANCEF) IVPB 2g/100 mL premix        2 g 200 mL/hr over 30 Minutes Intravenous On call 01/15/22 0918 01/15/22 1102       Subjective: Luke Foster is seen after surgery, he is on ng suction, currently denies pain, no n/v  Brother at bedside     Objective: Vitals:   01/15/22 1251 01/15/22 1300 01/15/22 1315 01/15/22 1335  BP:  (!) 141/81 136/83 127/84  Pulse: 93 91 93 87  Resp: '19 17 15 16  '$ Temp:    98.5 F (36.9 C)  TempSrc:     Oral  SpO2: 97% 100% 99% 98%  Weight:      Height:        Intake/Output Summary (Last 24 hours) at 01/15/2022 1826 Last data filed at 01/15/2022 1611 Gross per 24 hour  Intake 2220 ml  Output 2700 ml  Net -480 ml   Filed Weights   01/12/22 1424 01/15/22 0949  Weight: 95.3 kg 95 kg   Physical Exam  Gen:- Awake Alert, in no acute distress HEENT:- [place].AT, No sclera icterus Nose-+ NG tube  Neck-Supple Neck,No JVD,.  Lungs-  CTAB , fair symmetrical air movement CV- S1, S2 normal, regular  Abd-post op changes , diminished bowel sounds, , extremity/Skin:- No  edema, pedal pulses present  Psych-affect is appropriate, oriented x3 Neuro-chronic neuromuscular deficits especially of the right lower extremity , no additional new focal deficits, no tremors  Data Reviewed: I have personally reviewed following labs and imaging studies  CBC: Recent Labs  Lab 01/12/22 1556 01/14/22 0508 01/15/22 0633  WBC 14.8* 10.4 11.5*  NEUTROABS 12.9*  --   --   HGB 12.6* 11.7* 12.3*  HCT 39.8 37.5* 39.0  MCV 92.3 93.1 92.4  PLT 359 295 425   Basic Metabolic Panel: Recent Labs  Lab 01/12/22 1556 01/14/22 0508 01/15/22 0633  NA 137 139 141  K 3.7 3.7 3.2*  CL 103 107 103  CO2 '23 23 26  '$ GLUCOSE 117* 90 99  BUN '18 18 23  '$ CREATININE 1.20 1.07 1.13  CALCIUM 9.8 9.1 9.2  MG  --   --  2.3   GFR: Estimated Creatinine Clearance: 71.4 mL/min (by C-G formula based on SCr of 1.13 mg/dL). Liver Function Tests: Recent Labs  Lab 01/12/22 1556 01/14/22 0508  AST 26 20  ALT 24 18  ALKPHOS 80 69  BILITOT 0.6 0.8  PROT 8.0 7.0  ALBUMIN 3.9 3.4*   Radiology Studies: DG Abd Portable 1V-Small Bowel Obstruction Protocol-initial, 8 hr delay  Result Date: 01/15/2022 CLINICAL DATA:  Small bowel obstruction EXAM: PORTABLE ABDOMEN - 1 VIEW COMPARISON:  Radiographs dated January 14, 2022 FINDINGS: Enteric tube tip and side port are in the stomach. Small amount of contrast material seen in the  stomach. Numerous dilated loops of small bowel, similar to prior exam. Scattered colonic gas. Unchanged position of right ureter stent. Visualized lung bases are clear. IMPRESSION: Dilated loops of small bowel, similar to prior exam. Electronically Signed   By: Yetta Glassman M.D.   On: 01/15/2022 08:56   DG Abd Portable 1V-Small Bowel Obstruction Protocol-initial, 8 hr delay  Result Date: 01/14/2022 CLINICAL DATA:  8 hour delay exam. EXAM: PORTABLE ABDOMEN - 1 VIEW  COMPARISON:  Earlier radiograph dated 01/14/2022. FINDINGS: Partially visualized enteric tube in the left upper abdomen. Small amount of oral contrast noted adjacent to the tip of the tube. No oral contrast noted within the bowel loops. Persistent air distended loops of small bowel measuring up to 4.5 cm in diameter. Right-sided pigtail ureteral catheter. Osteopenia with degenerative changes and lower lumbar fusion. No acute osseous pathology. IMPRESSION: Persistent air distended loops of small bowel. Electronically Signed   By: Anner Crete M.D.   On: 01/14/2022 20:01   DG Abd Portable 1V-Small Bowel Protocol-Position Verification  Result Date: 01/14/2022 CLINICAL DATA:  Nasogastric tube placement, dilated small bowel question small-bowel obstruction EXAM: PORTABLE ABDOMEN - 1 VIEW COMPARISON:  Portable exam 1023 hours compared to 01/12/2022 FINDINGS: Nasogastric tube projects over proximal stomach, proximal side-port at or near GE junction region. Numerous dilated loops of small bowel in the mid and upper abdomen. Relative decrease in amount of colon gas versus small bowel consistent with small bowel obstruction. No bowel wall thickening. RIGHT ureteral stent and postsurgical changes of lower lumbar fusion noted. Bones demineralized. IMPRESSION: Increased small bowel dilatation likely representing small bowel obstruction. Electronically Signed   By: Lavonia Dana M.D.   On: 01/14/2022 10:47    Scheduled Meds:  acetaminophen  1,000 mg  Oral Q6H   bisacodyl  10 mg Rectal Daily   Chlorhexidine Gluconate Cloth  6 each Topical Daily   diltiazem  120 mg Oral QPM   venlafaxine XR  75 mg Oral Q breakfast   Continuous Infusions:  sodium chloride 125 mL/hr at 01/15/22 1707   methocarbamol (ROBAXIN) IV 500 mg (01/15/22 1709)     LOS: 3 days   Florencia Reasons M.D PhD FACP on 01/15/2022 at 6:26 PM  Go to [email or web address] - for contact info  [facility] - Office  [phone]  If 7PM-7AM, please contact night-coverage [email or web address] 01/15/2022, 6:26 PM

## 2022-01-15 NOTE — Anesthesia Postprocedure Evaluation (Signed)
Anesthesia Post Note  Patient: ARLANDO LEISINGER  Procedure(s) Performed: Exploratory laparotomy SMALL BOWEL RESECTION (Abdomen)  Patient location during evaluation: Phase II Anesthesia Type: General Level of consciousness: awake and alert and oriented Pain management: pain level controlled Vital Signs Assessment: post-procedure vital signs reviewed and stable Respiratory status: spontaneous breathing, nonlabored ventilation and respiratory function stable Cardiovascular status: blood pressure returned to baseline and stable Postop Assessment: no apparent nausea or vomiting Anesthetic complications: no  No notable events documented.   Last Vitals:  Vitals:   01/15/22 1300 01/15/22 1315  BP: (!) 141/81   Pulse: 91 93  Resp: 17 15  Temp:    SpO2: 100% 99%    Last Pain:  Vitals:   01/15/22 1315  TempSrc:   PainSc: Asleep                 Kazue Cerro C Tracy Kinner

## 2022-01-15 NOTE — Progress Notes (Signed)
NGT unclamped and placed back to LIWS.

## 2022-01-16 LAB — CBC WITH DIFFERENTIAL/PLATELET
Abs Immature Granulocytes: 0.05 10*3/uL (ref 0.00–0.07)
Basophils Absolute: 0 10*3/uL (ref 0.0–0.1)
Basophils Relative: 0 %
Eosinophils Absolute: 0 10*3/uL (ref 0.0–0.5)
Eosinophils Relative: 0 %
HCT: 38.1 % — ABNORMAL LOW (ref 39.0–52.0)
Hemoglobin: 12.2 g/dL — ABNORMAL LOW (ref 13.0–17.0)
Immature Granulocytes: 0 %
Lymphocytes Relative: 8 %
Lymphs Abs: 1.4 10*3/uL (ref 0.7–4.0)
MCH: 29.8 pg (ref 26.0–34.0)
MCHC: 32 g/dL (ref 30.0–36.0)
MCV: 92.9 fL (ref 80.0–100.0)
Monocytes Absolute: 0.8 10*3/uL (ref 0.1–1.0)
Monocytes Relative: 5 %
Neutro Abs: 16 10*3/uL — ABNORMAL HIGH (ref 1.7–7.7)
Neutrophils Relative %: 87 %
Platelets: 302 10*3/uL (ref 150–400)
RBC: 4.1 MIL/uL — ABNORMAL LOW (ref 4.22–5.81)
RDW: 12.9 % (ref 11.5–15.5)
WBC: 18.3 10*3/uL — ABNORMAL HIGH (ref 4.0–10.5)
nRBC: 0 % (ref 0.0–0.2)

## 2022-01-16 LAB — MAGNESIUM: Magnesium: 2.3 mg/dL (ref 1.7–2.4)

## 2022-01-16 LAB — BASIC METABOLIC PANEL
Anion gap: 11 (ref 5–15)
BUN: 28 mg/dL — ABNORMAL HIGH (ref 8–23)
CO2: 24 mmol/L (ref 22–32)
Calcium: 8.8 mg/dL — ABNORMAL LOW (ref 8.9–10.3)
Chloride: 107 mmol/L (ref 98–111)
Creatinine, Ser: 1.08 mg/dL (ref 0.61–1.24)
GFR, Estimated: >60 mL/min (ref >60)
Glucose, Bld: 113 mg/dL — ABNORMAL HIGH (ref 70–99)
Potassium: 3.5 mmol/L (ref 3.5–5.1)
Sodium: 142 mmol/L (ref 135–145)

## 2022-01-16 MED ORDER — POTASSIUM CHLORIDE 10 MEQ/100ML IV SOLN
10.0000 meq | INTRAVENOUS | Status: AC
  Administered 2022-01-16 (×4): 10 meq via INTRAVENOUS
  Filled 2022-01-16: qty 100

## 2022-01-16 MED ORDER — METHOCARBAMOL 1000 MG/10ML IJ SOLN
500.0000 mg | Freq: Three times a day (TID) | INTRAVENOUS | Status: DC
  Administered 2022-01-16 – 2022-01-17 (×3): 500 mg via INTRAVENOUS
  Filled 2022-01-16 (×6): qty 5

## 2022-01-16 NOTE — TOC Progression Note (Signed)
Transition of Care [facility]) - Progression Note    Patient Details  Name: Luke Foster MRN: [id number] Date of Birth: 1950-07-24  Transition of Care [facility]) CM/SW Contact  Joaquin Courts, RN Phone Number: 01/16/2022, 1:18 PM  Clinical Narrative:    CM noted PT recommendations for SNF, discussed with patient who states he is not interested in SNF rehab and wants to return home where he has caregivers and receives HHPT/OT services.  CM with patient permission also spoke with Brother who confirms that there are caregivers in the home as well as [facility] services.  Brother also explains that patient has a chronic catheter and receives [facility] services along with PT/OT. Per patient/brother patient is active with [facility] and wants to resume services at dc.  CM spoke with [facility] rep and confirmed services and resumption at DC.  Updated MD and requested resumption of care orders for HHPT/OT/RN.     Expected Discharge Plan: [place] Barriers to Discharge: Continued Medical Work up  Expected Discharge Plan and Services Expected Discharge Plan: [place]   Discharge Planning Services: CM Consult Post Acute Care Choice: Resumption of Svcs/PTA Provider Living arrangements for the past 2 months: Single Family Home                   DME Agency: NA       HH Arranged: RN, PT, OT HH Agency: [place] Date [place]: 01/16/22 Time [place]: [id number] Representative spoke with at [place]: [place] ([place]) Interventions    Readmission Risk Interventions    01/16/2022    1:14 PM 12/01/2021    3:11 PM  Readmission Risk Prevention Plan  Transportation Screening Complete Complete  PCP or Specialist Appt within 5-7 Days  Complete  Home Care Screening  Complete  Medication Review (RN CM)  Complete  HRI or Home Care Consult Complete   Social Work Consult for [place] Planning/Counseling Complete    Palliative Care Screening Not Applicable   Medication Review Press photographer) Complete

## 2022-01-16 NOTE — Plan of Care (Signed)
  Problem: Acute Rehab PT Goals(only PT should resolve) Goal: Pt Will Go Supine/Side To Sit Outcome: Progressing Flowsheets (Taken 01/16/2022 0955) Pt will go Supine/Side to Sit:  with min guard assist  with minimal assist Goal: Pt Will Go Sit To Supine/Side Outcome: Progressing Flowsheets (Taken 01/16/2022 0955) Pt will go Sit to Supine/Side:  with min guard assist  with minimal assist Goal: Patient Will Transfer Sit To/From Stand Outcome: Progressing Flowsheets (Taken 01/16/2022 0955) Patient will transfer sit to/from stand:  with min guard assist  with minimal assist Goal: Pt Will Transfer Bed To Chair/Chair To Bed Outcome: Progressing Flowsheets (Taken 01/16/2022 0955) Pt will Transfer Bed to Chair/Chair to Bed:  min guard assist  with min assist Goal: Pt Will Ambulate Outcome: Progressing Flowsheets (Taken 01/16/2022 0955) Pt will Ambulate:  25 feet  with rolling walker  with min guard assist  with minimal assist Goal: Pt/caregiver will Perform Home Exercise Program Outcome: Progressing Flowsheets (Taken 01/16/2022 0955) Pt/caregiver will Perform Home Exercise Program:  For increased strengthening  For improved balance  Independently  9:56 AM, 01/16/22 Mearl Latin PT, DPT Physical Therapist at [facility]

## 2022-01-16 NOTE — Care Management Important Message (Signed)
Important Message  Patient Details  Name: Luke Foster MRN: [id number] Date of Birth: 1950/08/30   Medicare Important Message Given:  Yes     [name] Medal 01/16/2022, 1:14 PM

## 2022-01-16 NOTE — Progress Notes (Signed)
[facility] Progress Note  1 Day Post-Op  Subjective: Patient seen and examined.  He is resting comfortably in bed.  His NG tube remains in place with clear brown output in canister, 1.1 L in the last 24 hours.  He has some abdominal tenderness, but is overall feeling okay.  He denies nausea and vomiting.  He states that he did pass some flatus and a small bowel movement yesterday, though there is nothing documented in the chart.  Objective: Vital signs in last 24 hours: Temp:  [98.5 F (36.9 C)-99.2 F (37.3 C)] 98.6 F (37 C) (12/08 0607) Pulse Rate:  [80-93] 80 (12/08 0607) Resp:  [14-20] 16 (12/07 1335) BP: (127-155)/(42-97) 134/68 (12/08 0607) SpO2:  [97 %-100 %] 100 % (12/08 0607) Last BM Date : 01/14/22  Intake/Output from previous day: 12/07 0701 - 12/08 0700 In: 3098.3 [I.V.:2671.7; IV Piggyback:426.7] Out: 2725 [Urine:1525; Emesis/NG output:1100; Blood:100] Intake/Output this shift: Total I/O In: 1044.1 [I.V.:1044.1] Out: -   General appearance: alert, cooperative, and no distress Nose: NG tube in place with clear brown liquid in canister GI: Abdomen soft, nondistended, no percussion tenderness, mild incisional tenderness to palpation; no rigidity, guarding, rebound tenderness; midline incision C/ND/I with skin staples in place and honeycomb dressing  Lab Results:  Recent Labs    01/15/22 0633 01/16/22 0552  WBC 11.5* 18.3*  HGB 12.3* 12.2*  HCT 39.0 38.1*  PLT 311 302   BMET Recent Labs    01/15/22 0633 01/16/22 0552  NA 141 142  K 3.2* 3.5  CL 103 107  CO2 26 24  GLUCOSE 99 113*  BUN 23 28*  CREATININE 1.13 1.08  CALCIUM 9.2 8.8*   PT/INR No results for input(s): "LABPROT", "INR" in the last 72 hours.  Studies/Results: DG Abd Portable 1V-Small Bowel Obstruction Protocol-initial, 8 hr delay  Result Date: 01/15/2022 CLINICAL DATA:  Small bowel obstruction EXAM: PORTABLE ABDOMEN - 1 VIEW COMPARISON:  Radiographs dated January 14, 2022 FINDINGS: Enteric tube tip and side port are in the stomach. Small amount of contrast material seen in the stomach. Numerous dilated loops of small bowel, similar to prior exam. Scattered colonic gas. Unchanged position of right ureter stent. Visualized lung bases are clear. IMPRESSION: Dilated loops of small bowel, similar to prior exam. Electronically Signed   By: Yetta Glassman M.D.   On: 01/15/2022 08:56   DG Abd Portable 1V-Small Bowel Obstruction Protocol-initial, 8 hr delay  Result Date: 01/14/2022 CLINICAL DATA:  8 hour delay exam. EXAM: PORTABLE ABDOMEN - 1 VIEW COMPARISON:  Earlier radiograph dated 01/14/2022. FINDINGS: Partially visualized enteric tube in the left upper abdomen. Small amount of oral contrast noted adjacent to the tip of the tube. No oral contrast noted within the bowel loops. Persistent air distended loops of small bowel measuring up to 4.5 cm in diameter. Right-sided pigtail ureteral catheter. Osteopenia with degenerative changes and lower lumbar fusion. No acute osseous pathology. IMPRESSION: Persistent air distended loops of small bowel. Electronically Signed   By: Anner Crete M.D.   On: 01/14/2022 20:01    Anti-infectives: Anti-infectives (From admission, onward)    Start     Dose/Rate Route Frequency Ordered Stop   01/15/22 1007  ceFAZolin (ANCEF) 2-4 GM/100ML-% IVPB       Note to Pharmacy: Abbie Sons S: cabinet override      01/15/22 1007 01/15/22 1036   01/15/22 0930  ceFAZolin (ANCEF) IVPB 2g/100 mL premix        2 g 200  mL/hr over 30 Minutes Intravenous On call 01/15/22 0923 01/15/22 1102       Assessment/Plan:  Patient is a 71 year old male who was admitted with small bowel obstruction.  He is status post exploratory laparotomy and small bowel resection on 12/7.  -Leukocytosis increased to 18.3 today, suspect this is reactive from recent surgery -Patient states that he has had gas with small bowel movements, though there is nothing  documented in the chart -Will maintain NG tube at this time to low intermittent suction -Likely plan for KUB tomorrow morning to evaluate bowel gas pattern -NPO -IV fluids -Dulcolax suppositories ordered -Monitor for return of bowel function -Appreciate hospitalist recommendations   LOS: 4 days    Shrihan Putt A Jaslin Novitski 01/16/2022

## 2022-01-16 NOTE — Progress Notes (Signed)
PROGRESS NOTE   Luke Foster, is a 71 y.o. male, DOB - Jul 31, 1950, JQB:[id number]  Admit date - 01/12/2022   Admitting Physician Bernadette Hoit, DO  Outpatient Primary MD for the patient is [name], MD  LOS - 4  Chief Complaint  Patient presents with   Abdominal Pain      Brief Narrative:   71 y.o. male with medical history significant of  chronic back and neck pain with spinal cord stimulator, lumbosacral radiculopathy with spinal cord stimulator, history of nephrolithiasis, history of bilateral hydronephrosis, hyperlipidemia, neurogenic bladder with indwelling Foley, chronic constipation admitted 01/02/2022 with concerns about small bowel obstruction and mildly obstructed right ureteral stent with mild right hydronephrosis    -Assessment and Plan:  SBO---  -s/p ex lap and resect section of small bowel and scarring ,  pathology  pending -He has skin staples in place, which will be removed in 2 weeks  -awaiting returning of bowel function ,though narcotic use and inactivity could hinder progress -diet order per gen surg , appreciate input  Leukocytosis -Likely reactive from surgery, monitor  Hypokalemia, replace, recheck in the morning   Rt sided Hydronephrosis-CT abdomen and pelvis shows Right ureteral stent with proximal pigtail at the right ureteropelvic junction and distal pigtail within the urinary bladder lumen. Associated urothelial thickening of the right ureter and mild right hydronephrosis. - last sent placed for kidney stone on 11/27, foley exchanged on 11/27 by Dr Gloriann Loan, he missed appoint with Dr Gloriann Loan on 12/5 due to hospitalization -family requested stent to be removed while he is in the hospital, Discussed with on-call urologist Dr. Alyson Ingles who recommend outpatient follow up with  Dr Gloriann Loan in [place] or Dr Alyson Ingles in [place] ( if patient does not desire to travel to [facility]) for stent removal after discharge,  urology does not have the equipment in the  hospital, this has to be done in urology's office     HTN--gen surg ordered npo /sips with meds, resume cardizem, continue IV labetalol as needed elevated BP  FTT/ chronic physical debility  , mostly bed to wheelchair, able to walk with a walker for a short distance at home, has trapeze bar at home to help him get out of bed, he desires to have one here to help with mobility, order placed, also ordered PT --chronic opiate use -Switch to IV fentanyl as needed while NG tube in situ  Depression and anxiety---may use as needed IV lorazepam until oral intake resumes at which point may switch back to Effexor     Disposition: The patient is from: Home              Anticipated d/c is to: Home with [place], he declined snf, he appears to have good family support              Anticipated d/c date is: TBD, need to be able to advance diet before d/c              Patient currently is not medically stable to d/c.   Code Status :  -  Code Status: Prior   Family Communication:   Brother  [name] at bedside on 12/7, over the phone x2 on 12/8, brother requests to be updated daily by [facility] DVT Prophylaxis  :   - SCDs  SCDs Start: 01/13/22 1318   Lab Results  Component Value Date   PLT 302 01/16/2022   Inpatient Medications  Scheduled Meds:  acetaminophen  1,000 mg Oral Q6H  bisacodyl  10 mg Rectal Daily   Chlorhexidine Gluconate Cloth  6 each Topical Daily   diltiazem  120 mg Oral QPM   venlafaxine XR  75 mg Oral Q breakfast   Continuous Infusions:  sodium chloride 125 mL/hr at 01/16/22 1013   methocarbamol (ROBAXIN) IV 500 mg (01/16/22 0623)   PRN Meds:.fentaNYL (SUBLIMAZE) injection, labetalol, LORazepam, oxyCODONE, rOPINIRole, traZODone   Anti-infectives (From admission, onward)    Start     Dose/Rate Route Frequency Ordered Stop   01/15/22 1007  ceFAZolin (ANCEF) 2-4 GM/100ML-% IVPB       Note to Pharmacy: Abbie Sons S: cabinet override      01/15/22 1007 01/15/22 1036   01/15/22  0930  ceFAZolin (ANCEF) IVPB 2g/100 mL premix        2 g 200 mL/hr over 30 Minutes Intravenous On call 01/15/22 0918 01/15/22 1102       Subjective:  POD#1, reports some flatus  he is on ng suction, no ab pain, no n/v   Reports chronic pain, getting iv prn fentanyl      Objective: Vitals:   01/15/22 1335 01/15/22 2054 01/16/22 0607 01/16/22 1420  BP: 127/84 (!) 133/42 134/68 (!) 163/84  Pulse: 87 88 80 79  Resp: 16   20  Temp: 98.5 F (36.9 C) 99.2 F (37.3 C) 98.6 F (37 C) 98.7 F (37.1 C)  TempSrc: Oral   Oral  SpO2: 98% 100% 100% 99%  Weight:      Height:        Intake/Output Summary (Last 24 hours) at 01/16/2022 1446 Last data filed at 01/16/2022 1013 Gross per 24 hour  Intake 2242.48 ml  Output 1225 ml  Net 1017.48 ml   Filed Weights   01/12/22 1424 01/15/22 0949  Weight: 95.3 kg 95 kg   Physical Exam  Gen:- Awake Alert, in no acute distress HEENT:- [place].AT, No sclera icterus Nose-+ NG tube  Neck-Supple Neck,No JVD,.  Lungs-  CTAB , fair symmetrical air movement CV- S1, S2 normal, regular  Abd-post op changes , diminished bowel sounds, , extremity/Skin:- No  edema, pedal pulses present  Psych-affect is appropriate, oriented x3 Neuro-chronic neuromuscular deficits especially of the right lower extremity , no additional new focal deficits, no tremors  Data Reviewed: I have personally reviewed following labs and imaging studies  CBC: Recent Labs  Lab 01/12/22 1556 01/14/22 0508 01/15/22 0633 01/16/22 0552  WBC 14.8* 10.4 11.5* 18.3*  NEUTROABS 12.9*  --   --  16.0*  HGB 12.6* 11.7* 12.3* 12.2*  HCT 39.8 37.5* 39.0 38.1*  MCV 92.3 93.1 92.4 92.9  PLT 359 295 311 269   Basic Metabolic Panel: Recent Labs  Lab 01/12/22 1556 01/14/22 0508 01/15/22 0633 01/16/22 0552  NA 137 139 141 142  K 3.7 3.7 3.2* 3.5  CL 103 107 103 107  CO2 '23 23 26 24  '$ GLUCOSE 117* 90 99 113*  BUN '18 18 23 '$ 28*  CREATININE 1.20 1.07 1.13 1.08  CALCIUM 9.8 9.1 9.2  8.8*  MG  --   --  2.3 2.3   GFR: Estimated Creatinine Clearance: 74.7 mL/min (by C-G formula based on SCr of 1.08 mg/dL). Liver Function Tests: Recent Labs  Lab 01/12/22 1556 01/14/22 0508  AST 26 20  ALT 24 18  ALKPHOS 80 69  BILITOT 0.6 0.8  PROT 8.0 7.0  ALBUMIN 3.9 3.4*   Radiology Studies: DG Abd Portable 1V-Small Bowel Obstruction Protocol-initial, 8 hr delay  Result Date: 01/15/2022  CLINICAL DATA:  Small bowel obstruction EXAM: PORTABLE ABDOMEN - 1 VIEW COMPARISON:  Radiographs dated January 14, 2022 FINDINGS: Enteric tube tip and side port are in the stomach. Small amount of contrast material seen in the stomach. Numerous dilated loops of small bowel, similar to prior exam. Scattered colonic gas. Unchanged position of right ureter stent. Visualized lung bases are clear. IMPRESSION: Dilated loops of small bowel, similar to prior exam. Electronically Signed   By: Yetta Glassman M.D.   On: 01/15/2022 08:56   DG Abd Portable 1V-Small Bowel Obstruction Protocol-initial, 8 hr delay  Result Date: 01/14/2022 CLINICAL DATA:  8 hour delay exam. EXAM: PORTABLE ABDOMEN - 1 VIEW COMPARISON:  Earlier radiograph dated 01/14/2022. FINDINGS: Partially visualized enteric tube in the left upper abdomen. Small amount of oral contrast noted adjacent to the tip of the tube. No oral contrast noted within the bowel loops. Persistent air distended loops of small bowel measuring up to 4.5 cm in diameter. Right-sided pigtail ureteral catheter. Osteopenia with degenerative changes and lower lumbar fusion. No acute osseous pathology. IMPRESSION: Persistent air distended loops of small bowel. Electronically Signed   By: Anner Crete M.D.   On: 01/14/2022 20:01    Scheduled Meds:  acetaminophen  1,000 mg Oral Q6H   bisacodyl  10 mg Rectal Daily   Chlorhexidine Gluconate Cloth  6 each Topical Daily   diltiazem  120 mg Oral QPM   venlafaxine XR  75 mg Oral Q breakfast   Continuous Infusions:  sodium  chloride 125 mL/hr at 01/16/22 1013   methocarbamol (ROBAXIN) IV 500 mg (01/16/22 1941)     LOS: 4 days   Florencia Reasons M.D PhD FACP on 01/16/2022 at 2:46 PM  Go to [email or web address] - for contact info  [facility] - Office  [phone]  If 7PM-7AM, please contact night-coverage [email or web address] 01/16/2022, 2:46 PM

## 2022-01-16 NOTE — Evaluation (Signed)
Physical Therapy Evaluation Patient Details Name: Luke Foster MRN: [id number] DOB: 06-Feb-1951 Today's Date: 01/16/2022  History of Present Illness  Luke Foster is a 71 y.o. male with medical history significant of  chronic back and neck pain with spinal cord stimulator, lumbosacral radiculopathy with spinal cord stimulator, history of nephrolithiasis, history of bilateral hydronephrosis, hyperlipidemia, neurogenic bladder with indwelling Foley, chronic constipation who presents to the emergency department due to 1 day of onset of abdominal pain and nausea.  Patient was sitting down on his recliner late last night when symptoms started, he complained of diffuse abdominal pain with no radiation, it is accompanied by nausea and dry heaves and patient has not eaten any meal since onset of symptoms.  He endorsed a history of partial small bowel obstruction.  Patient denies chest pain, shortness of breath, fever, chills.  Last bowel movement was this morning and it was normal.  Patient had a cystoscopy done on 01/05/2022 for right ureteral stent placement and he has chronic indwelling catheter and is currently on Bactrim.   Clinical Impression  Patient limited for functional mobility as stated below secondary to BLE weakness, fatigue, pain, and impaired standing balance. Patient requires assist for BLE mobility and assist to pull to seated EOB. He demonstrates good sitting balance at EOB. He requires several attempts and mod assist to power up to standing with RW secondary to weakness. Patient limited to several small steps at bedside with RW and has great difficulty with RLE mobility. Patient assisted back to bed at EOS. Patient will benefit from continued physical therapy in hospital and recommended venue below to increase strength, balance, endurance for safe ADLs and gait.        Recommendations for follow up therapy are one component of a multi-disciplinary discharge planning process, led by the  attending physician.  Recommendations may be updated based on patient status, additional functional criteria and insurance authorization.  Follow Up Recommendations Skilled nursing-short term rehab (<3 hours/day) Can patient physically be transported by private vehicle: No    Assistance Recommended at Discharge Frequent or constant Supervision/Assistance  Patient can return home with the following  A lot of help with walking and/or transfers;A lot of help with bathing/dressing/bathroom;Assistance with cooking/housework    Equipment Recommendations None recommended by PT  Recommendations for Other Services       Functional Status Assessment Patient has had a recent decline in their functional status and demonstrates the ability to make significant improvements in function in a reasonable and predictable amount of time.     Precautions / Restrictions Precautions Precautions: Fall Restrictions Weight Bearing Restrictions: No      Mobility  Bed Mobility Overal bed mobility: Needs Assistance Bed Mobility: Supine to Sit, Sit to Supine     Supine to sit: Min assist, Mod assist, HOB elevated Sit to supine: Min assist   General bed mobility comments: assist for LE mobility and to pull to seated EOB with HOB elevated slightly; assist for BLE back into bed    Transfers Overall transfer level: Needs assistance Equipment used: Rolling walker (2 wheels) Transfers: Sit to/from Stand Sit to Stand: Mod assist           General transfer comment: requires several attempts and assist to transfer to standing with RW    Ambulation/Gait Ambulation/Gait assistance: Min assist, Mod assist Gait Distance (Feet): 2 Feet Assistive device: Rolling walker (2 wheels)         General Gait Details: a few small lateral steps  at bedside with RW, great difficulty with RLE movement  Stairs            Wheelchair Mobility    Modified Rankin (Stroke Patients Only)       Balance  Overall balance assessment: Needs assistance Sitting-balance support: Feet supported, No upper extremity supported Sitting balance-Leahy Scale: Good Sitting balance - Comments: seated EOB   Standing balance support: Bilateral upper extremity supported, Reliant on assistive device for balance Standing balance-Leahy Scale: Fair Standing balance comment: with RW                             Pertinent Vitals/Pain Pain Assessment Pain Assessment: No/denies pain    Home Living Family/patient expects to be discharged to:: Private residence Living Arrangements: Alone Available Help at Discharge: Personal care attendant (8 hours daily) Type of Home: House Home Access: Ramped entrance       Home Layout: One level Home Equipment: Shower seat - built Medical sales representative (2 wheels);Cane - single point;Hand held shower head      Prior Function Prior Level of Function : Independent/Modified Independent             Mobility Comments: uses RW ADLs Comments: patient states independent with basic ADL, aid assists with cooking/cleaning     Hand Dominance        Extremity/Trunk Assessment   Upper Extremity Assessment Upper Extremity Assessment: Overall WFL for tasks assessed    Lower Extremity Assessment Lower Extremity Assessment: Generalized weakness    Cervical / Trunk Assessment Cervical / Trunk Assessment: Kyphotic  Communication   Communication: No difficulties  Cognition Arousal/Alertness: Awake/alert Behavior During Therapy: WFL for tasks assessed/performed Overall Cognitive Status: Within Functional Limits for tasks assessed                                          General Comments      Exercises     Assessment/Plan    PT Assessment Patient needs continued PT services  PT Problem List Decreased strength;Decreased mobility;Decreased activity tolerance;Decreased balance;Pain       PT Treatment Interventions DME  instruction;Therapeutic exercise;Gait training;Balance training;Stair training;Neuromuscular re-education;Therapeutic activities;Patient/family education    PT Goals (Current goals can be found in the Care Plan section)  Acute Rehab PT Goals Patient Stated Goal: return home PT Goal Formulation: With patient Time For Goal Achievement: 01/30/22 Potential to Achieve Goals: Good    Frequency Min 3X/week     Co-evaluation               AM-PAC PT "6 Clicks" Mobility  Outcome Measure Help needed turning from your back to your side while in a flat bed without using bedrails?: A Little Help needed moving from lying on your back to sitting on the side of a flat bed without using bedrails?: A Lot Help needed moving to and from a bed to a chair (including a wheelchair)?: A Lot Help needed standing up from a chair using your arms (e.g., wheelchair or bedside chair)?: A Lot Help needed to walk in hospital room?: A Lot Help needed climbing 3-5 steps with a railing? : A Lot 6 Click Score: 13    End of Session Equipment Utilized During Treatment: Gait belt Activity Tolerance: Patient limited by fatigue Patient left: in bed;with call bell/phone within reach Nurse Communication: Mobility status PT Visit Diagnosis:  Unsteadiness on feet (R26.81);Other abnormalities of gait and mobility (R26.89);Muscle weakness (generalized) (M62.81)    Time: 6815-9470 PT Time Calculation (min) (ACUTE ONLY): 22 min   Charges:   PT Evaluation $PT Eval Low Complexity: 1 Low PT Treatments $Therapeutic Activity: 8-22 mins        9:54 AM, 01/16/22 Mearl Latin PT, DPT Physical Therapist at [facility]

## 2022-01-17 ENCOUNTER — Inpatient Hospital Stay (HOSPITAL_COMMUNITY): Payer: Medicare Other

## 2022-01-17 LAB — CBC WITH DIFFERENTIAL/PLATELET
Abs Immature Granulocytes: 0.04 10*3/uL (ref 0.00–0.07)
Basophils Absolute: 0 10*3/uL (ref 0.0–0.1)
Basophils Relative: 0 %
Eosinophils Absolute: 0 10*3/uL (ref 0.0–0.5)
Eosinophils Relative: 0 %
HCT: 42.7 % (ref 39.0–52.0)
Hemoglobin: 13.3 g/dL (ref 13.0–17.0)
Immature Granulocytes: 0 %
Lymphocytes Relative: 11 %
Lymphs Abs: 1.3 10*3/uL (ref 0.7–4.0)
MCH: 29.7 pg (ref 26.0–34.0)
MCHC: 31.1 g/dL (ref 30.0–36.0)
MCV: 95.3 fL (ref 80.0–100.0)
Monocytes Absolute: 0.5 10*3/uL (ref 0.1–1.0)
Monocytes Relative: 4 %
Neutro Abs: 9.8 10*3/uL — ABNORMAL HIGH (ref 1.7–7.7)
Neutrophils Relative %: 85 %
Platelets: 184 10*3/uL (ref 150–400)
RBC: 4.48 MIL/uL (ref 4.22–5.81)
RDW: 13.2 % (ref 11.5–15.5)
WBC: 11.7 10*3/uL — ABNORMAL HIGH (ref 4.0–10.5)
nRBC: 0 % (ref 0.0–0.2)

## 2022-01-17 LAB — BASIC METABOLIC PANEL
Anion gap: 7 (ref 5–15)
BUN: 26 mg/dL — ABNORMAL HIGH (ref 8–23)
CO2: 22 mmol/L (ref 22–32)
Calcium: 8.4 mg/dL — ABNORMAL LOW (ref 8.9–10.3)
Chloride: 112 mmol/L — ABNORMAL HIGH (ref 98–111)
Creatinine, Ser: 0.91 mg/dL (ref 0.61–1.24)
GFR, Estimated: >60 mL/min (ref >60)
Glucose, Bld: 96 mg/dL (ref 70–99)
Potassium: 3.2 mmol/L — ABNORMAL LOW (ref 3.5–5.1)
Sodium: 141 mmol/L (ref 135–145)

## 2022-01-17 MED ORDER — LACTATED RINGERS IV SOLN: INTRAVENOUS | Status: AC

## 2022-01-17 MED ORDER — OXYCODONE HCL 5 MG PO TABS
15.0000 mg | ORAL_TABLET | Freq: Four times a day (QID) | ORAL | Status: DC | PRN
  Administered 2022-01-17 – 2022-01-20 (×11): 15 mg via ORAL
  Filled 2022-01-17 (×11): qty 3

## 2022-01-17 MED ORDER — POTASSIUM CHLORIDE 10 MEQ/100ML IV SOLN
10.0000 meq | INTRAVENOUS | Status: AC
  Administered 2022-01-17 (×4): 10 meq via INTRAVENOUS
  Filled 2022-01-17 (×4): qty 100

## 2022-01-17 MED ORDER — METHOCARBAMOL 500 MG PO TABS
500.0000 mg | ORAL_TABLET | Freq: Three times a day (TID) | ORAL | Status: DC
  Administered 2022-01-17 – 2022-01-20 (×9): 500 mg via ORAL
  Filled 2022-01-17 (×9): qty 1

## 2022-01-17 NOTE — Progress Notes (Addendum)
[facility] Progress Note  2 Days Post-Op  Subjective: Patient seen and examined.  He is resting comfortably in bed.  He states that he is passing a large amount of flatus.  He denies any nausea and vomiting.  His abdominal pain continues to improve.  He denies any bowel movements in the last 24 hours.  NG tube remains in place with 1.5 cc of clear brown output in the last 24 hours.  Objective: Vital signs in last 24 hours: Temp:  [98.4 F (36.9 C)-98.8 F (37.1 C)] 98.4 F (36.9 C) (12/09 0526) Pulse Rate:  [76-82] 76 (12/09 0526) Resp:  [18-20] 18 (12/09 0526) BP: (137-163)/(73-84) 137/73 (12/09 0526) SpO2:  [94 %-99 %] 98 % (12/09 0526) Last BM Date : 01/14/22  Intake/Output from previous day: 12/08 0701 - 12/09 0700 In: 4210.8 [P.O.:360; I.V.:3165.6; NG/GT:30; IV Piggyback:655.2] Out: 3000 [Urine:1450; Emesis/NG output:1550] Intake/Output this shift: No intake/output data recorded.  General appearance: alert, cooperative, and no distress Nose: NG tube in place with clear brown liquid in canister GI: Abdomen soft,, nondistended, no percussion tenderness, minimal incisional tenderness to palpation; no rigidity, guarding, rebound tenderness; incision C/D/I with skin staples in place and honeycomb dressing  Lab Results:  Recent Labs    01/16/22 0552 01/17/22 0438  WBC 18.3* 11.7*  HGB 12.2* 13.3  HCT 38.1* 42.7  PLT 302 184   BMET Recent Labs    01/16/22 0552 01/17/22 0438  NA 142 141  K 3.5 3.2*  CL 107 112*  CO2 24 22  GLUCOSE 113* 96  BUN 28* 26*  CREATININE 1.08 0.91  CALCIUM 8.8* 8.4*   PT/INR No results for input(s): "LABPROT", "INR" in the last 72 hours.  Studies/Results: DG Abd 2 Views  Result Date: 01/17/2022 CLINICAL DATA:  Postoperative ileus versus small-bowel obstruction. EXAM: ABDOMEN - 2 VIEW COMPARISON:  01/15/2022 FINDINGS: NG tube remains in the proximal stomach. Right ureteral stent in place. Decreasing bowel distention  since prior study. Slightly prominent left abdominal small bowel loops. Gas within nondistended colon. No free air or organomegaly. IMPRESSION: Improving bowel gas pattern with a few mildly prominent left abdominal small bowel loops. This could reflect resolving obstruction or ileus. Electronically Signed   By: Rolm Baptise M.D.   On: 01/17/2022 09:41    Anti-infectives: Anti-infectives (From admission, onward)    Start     Dose/Rate Route Frequency Ordered Stop   01/15/22 1007  ceFAZolin (ANCEF) 2-4 GM/100ML-% IVPB       Note to Pharmacy: Abbie Sons S: cabinet override      01/15/22 1007 01/15/22 1036   01/15/22 0930  ceFAZolin (ANCEF) IVPB 2g/100 mL premix        2 g 200 mL/hr over 30 Minutes Intravenous On call 01/15/22 0918 01/15/22 1102       Assessment/Plan:  Patient is a 71 year old male who was admitted with small bowel obstruction.  He is status post exploratory laparotomy and small bowel resection on 12/7.   -Leukocytosis improved, 11.7 this a.m. -KUB this morning with improving bowel gas pattern -Given the patient states that he is passing flatus, will remove NG tube and trial clear liquid diet -Patient understands that there is a low threshold for NG tube replacement if he begins to have nausea or vomiting -IV fluids -Dulcolax suppositories ordered -Monitor for return of bowel function -Called and updated his brother regarding plan for today this is Mr. Schewe this is Dr. Okey Dupre I just calling to give you an update  since -Appreciate hospitalist recommendations   LOS: 5 days    Emberlin Verner A Josefita Weissmann 01/17/2022

## 2022-01-17 NOTE — Progress Notes (Addendum)
PROGRESS NOTE   Luke Foster, is a 71 y.o. male, DOB - 1950/09/28, QMV:[id number]  Admit date - 01/12/2022   Admitting Physician Bernadette Hoit, DO  Outpatient Primary MD for the patient is [name], MD  LOS - 5  Chief Complaint  Patient presents with   Abdominal Pain      Brief Narrative:   71 y.o. male with medical history significant of  chronic back and neck pain with spinal cord stimulator, lumbosacral radiculopathy with spinal cord stimulator, history of nephrolithiasis, history of bilateral hydronephrosis, hyperlipidemia, neurogenic bladder with indwelling Foley, chronic constipation admitted 01/02/2022 with concerns about small bowel obstruction and mildly obstructed right ureteral stent with mild right hydronephrosis    -Assessment and Plan:  SBO---  -s/p ex lap and resect section of small bowel and scarring ,  pathology  pending -He has skin staples in place, which will be removed in 2 weeks  -ng removed on 12/9, -diet order per gen surg , appreciate input  Leukocytosis -Likely reactive from surgery, Improving   Hypokalemia, remain low  ,continue to replace, recheck in the morning   Rt sided Hydronephrosis-CT abdomen and pelvis shows Right ureteral stent with proximal pigtail at the right ureteropelvic junction and distal pigtail within the urinary bladder lumen. Associated urothelial thickening of the right ureter and mild right hydronephrosis. - last sent placed for kidney stone on 11/27, foley exchanged on 11/27 by Dr Gloriann Loan, he missed appoint with Dr Gloriann Loan on 12/5 due to hospitalization -family requested stent to be removed while he is in the hospital, Discussed with on-call urologist Dr. Alyson Ingles who recommend outpatient follow up with  Dr Gloriann Loan in [place] or Dr Alyson Ingles in [place] ( if patient does not desire to travel to [facility]) for stent removal after discharge,  urology does not have the equipment in the hospital, this has to be done in urology's  office     HTN--bp stable on current regimen   FTT/ chronic physical debility  , mostly bed to wheelchair, able to walk with a walker for a short distance at home, has trapeze bar at home to help him get out of bed, ordered here per request  --chronic opiate use, on iv fentanyl when npo, resume home oral meds when able to take po meds   Depression and anxiety---stable     Disposition: The patient is from: Home              Anticipated d/c is to: Home with [facility], he declined snf, he appears to have good family support              Anticipated d/c date is: TBD, need to be able to advance diet before d/c              Patient currently is not medically stable to d/c.   Code Status :  -  Code Status: Prior   Family Communication:   Brother  [name] at bedside on 12/7, over the phone on 12/8, 12/9 brother requests to be updated daily by [facility] DVT Prophylaxis  :   - SCDs  SCDs Start: 01/13/22 1318   Lab Results  Component Value Date   PLT 184 01/17/2022   Inpatient Medications  Scheduled Meds:  acetaminophen  1,000 mg Oral Q6H   bisacodyl  10 mg Rectal Daily   Chlorhexidine Gluconate Cloth  6 each Topical Daily   diltiazem  120 mg Oral QPM   venlafaxine XR  75 mg Oral Q breakfast  Continuous Infusions:  lactated ringers 75 mL/hr at 01/17/22 1136   methocarbamol (ROBAXIN) IV 500 mg (01/17/22 0831)   potassium chloride 10 mEq (01/17/22 1140)   PRN Meds:.fentaNYL (SUBLIMAZE) injection, labetalol, LORazepam, oxyCODONE, rOPINIRole, traZODone   Anti-infectives (From admission, onward)    Start     Dose/Rate Route Frequency Ordered Stop   01/15/22 1007  ceFAZolin (ANCEF) 2-4 GM/100ML-% IVPB       Note to Pharmacy: Abbie Sons S: cabinet override      01/15/22 1007 01/15/22 1036   01/15/22 0930  ceFAZolin (ANCEF) IVPB 2g/100 mL premix        2 g 200 mL/hr over 30 Minutes Intravenous On call 01/15/22 0918 01/15/22 1102       Subjective:  POD#2, improving, passing gas, gen  surg at bedside, plan to remove ng today    He states night shift RN took good care of him    Objective: Vitals:   01/16/22 0607 01/16/22 1420 01/16/22 2053 01/17/22 0526  BP: 134/68 (!) 163/84 (!) 151/73 137/73  Pulse: 80 79 82 76  Resp:  '20 19 18  '$ Temp: 98.6 F (37 C) 98.7 F (37.1 C) 98.8 F (37.1 C) 98.4 F (36.9 C)  TempSrc:  Oral    SpO2: 100% 99% 94% 98%  Weight:      Height:        Intake/Output Summary (Last 24 hours) at 01/17/2022 1147 Last data filed at 01/17/2022 0504 Gross per 24 hour  Intake 3166.63 ml  Output 3000 ml  Net 166.63 ml   Filed Weights   01/12/22 1424 01/15/22 0949  Weight: 95.3 kg 95 kg   Physical Exam  Gen:- Awake Alert, in no acute distress HEENT:- [place].AT, No sclera icterus Nose-+ NG tube  Neck-Supple Neck,No JVD,.  Lungs-  CTAB , fair symmetrical air movement CV- S1, S2 normal, regular  Abd-post op changes , + bowel sounds, , extremity/Skin:- No  edema, pedal pulses present  Psych-affect is appropriate, oriented x3 Neuro-chronic neuromuscular deficits especially of the right lower extremity , no additional new focal deficits, no tremors  Data Reviewed: I have personally reviewed following labs and imaging studies  CBC: Recent Labs  Lab 01/12/22 1556 01/14/22 0508 01/15/22 0633 01/16/22 0552 01/17/22 0438  WBC 14.8* 10.4 11.5* 18.3* 11.7*  NEUTROABS 12.9*  --   --  16.0* 9.8*  HGB 12.6* 11.7* 12.3* 12.2* 13.3  HCT 39.8 37.5* 39.0 38.1* 42.7  MCV 92.3 93.1 92.4 92.9 95.3  PLT 359 295 311 302 659   Basic Metabolic Panel: Recent Labs  Lab 01/12/22 1556 01/14/22 0508 01/15/22 0633 01/16/22 0552 01/17/22 0438  NA 137 139 141 142 141  K 3.7 3.7 3.2* 3.5 3.2*  CL 103 107 103 107 112*  CO2 '23 23 26 24 22  '$ GLUCOSE 117* 90 99 113* 96  BUN '18 18 23 '$ 28* 26*  CREATININE 1.20 1.07 1.13 1.08 0.91  CALCIUM 9.8 9.1 9.2 8.8* 8.4*  MG  --   --  2.3 2.3  --    GFR: Estimated Creatinine Clearance: 88.6 mL/min (by C-G formula  based on SCr of 0.91 mg/dL). Liver Function Tests: Recent Labs  Lab 01/12/22 1556 01/14/22 0508  AST 26 20  ALT 24 18  ALKPHOS 80 69  BILITOT 0.6 0.8  PROT 8.0 7.0  ALBUMIN 3.9 3.4*   Radiology Studies: DG Abd 2 Views  Result Date: 01/17/2022 CLINICAL DATA:  Postoperative ileus versus small-bowel obstruction. EXAM: ABDOMEN - 2 VIEW  COMPARISON:  01/15/2022 FINDINGS: NG tube remains in the proximal stomach. Right ureteral stent in place. Decreasing bowel distention since prior study. Slightly prominent left abdominal small bowel loops. Gas within nondistended colon. No free air or organomegaly. IMPRESSION: Improving bowel gas pattern with a few mildly prominent left abdominal small bowel loops. This could reflect resolving obstruction or ileus. Electronically Signed   By: Rolm Baptise M.D.   On: 01/17/2022 09:41    Scheduled Meds:  acetaminophen  1,000 mg Oral Q6H   bisacodyl  10 mg Rectal Daily   Chlorhexidine Gluconate Cloth  6 each Topical Daily   diltiazem  120 mg Oral QPM   venlafaxine XR  75 mg Oral Q breakfast   Continuous Infusions:  lactated ringers 75 mL/hr at 01/17/22 1136   methocarbamol (ROBAXIN) IV 500 mg (01/17/22 0831)   potassium chloride 10 mEq (01/17/22 1140)     LOS: 5 days   Florencia Reasons M.D PhD FACP on 01/17/2022 at 11:47 AM  Go to [email or web address] - for contact info  [facility] - Office  [phone]  If 7PM-7AM, please contact night-coverage [email or web address] 01/17/2022, 11:47 AM

## 2022-01-18 LAB — BASIC METABOLIC PANEL
Anion gap: 4 — ABNORMAL LOW (ref 5–15)
BUN: 21 mg/dL (ref 8–23)
CO2: 26 mmol/L (ref 22–32)
Calcium: 8.4 mg/dL — ABNORMAL LOW (ref 8.9–10.3)
Chloride: 108 mmol/L (ref 98–111)
Creatinine, Ser: 0.84 mg/dL (ref 0.61–1.24)
GFR, Estimated: >60 mL/min (ref >60)
Glucose, Bld: 91 mg/dL (ref 70–99)
Potassium: 3.2 mmol/L — ABNORMAL LOW (ref 3.5–5.1)
Sodium: 138 mmol/L (ref 135–145)

## 2022-01-18 LAB — CBC WITH DIFFERENTIAL/PLATELET
Abs Immature Granulocytes: 0.04 10*3/uL (ref 0.00–0.07)
Basophils Absolute: 0.1 10*3/uL (ref 0.0–0.1)
Basophils Relative: 1 %
Eosinophils Absolute: 0.5 10*3/uL (ref 0.0–0.5)
Eosinophils Relative: 5 %
HCT: 31.4 % — ABNORMAL LOW (ref 39.0–52.0)
Hemoglobin: 9.8 g/dL — ABNORMAL LOW (ref 13.0–17.0)
Immature Granulocytes: 0 %
Lymphocytes Relative: 17 %
Lymphs Abs: 1.6 10*3/uL (ref 0.7–4.0)
MCH: 29.3 pg (ref 26.0–34.0)
MCHC: 31.2 g/dL (ref 30.0–36.0)
MCV: 94 fL (ref 80.0–100.0)
Monocytes Absolute: 0.5 10*3/uL (ref 0.1–1.0)
Monocytes Relative: 5 %
Neutro Abs: 6.8 10*3/uL (ref 1.7–7.7)
Neutrophils Relative %: 72 %
Platelets: 236 10*3/uL (ref 150–400)
RBC: 3.34 MIL/uL — ABNORMAL LOW (ref 4.22–5.81)
RDW: 13 % (ref 11.5–15.5)
WBC: 9.4 10*3/uL (ref 4.0–10.5)
nRBC: 0 % (ref 0.0–0.2)

## 2022-01-18 LAB — HEMOGLOBIN AND HEMATOCRIT, BLOOD
HCT: 31.1 % — ABNORMAL LOW (ref 39.0–52.0)
Hemoglobin: 9.6 g/dL — ABNORMAL LOW (ref 13.0–17.0)

## 2022-01-18 LAB — MAGNESIUM: Magnesium: 2.2 mg/dL (ref 1.7–2.4)

## 2022-01-18 MED ORDER — SENNOSIDES-DOCUSATE SODIUM 8.6-50 MG PO TABS
2.0000 | ORAL_TABLET | Freq: Two times a day (BID) | ORAL | Status: DC
  Administered 2022-01-18 – 2022-01-20 (×5): 2 via ORAL
  Filled 2022-01-18 (×6): qty 2

## 2022-01-18 MED ORDER — POTASSIUM CHLORIDE 10 MEQ/100ML IV SOLN
10.0000 meq | INTRAVENOUS | Status: AC
  Administered 2022-01-18 (×4): 10 meq via INTRAVENOUS
  Filled 2022-01-18 (×4): qty 100

## 2022-01-18 MED ORDER — POLYETHYLENE GLYCOL 3350 17 G PO PACK
17.0000 g | PACK | Freq: Every day | ORAL | Status: DC
  Administered 2022-01-18 – 2022-01-19 (×2): 17 g via ORAL
  Filled 2022-01-18 (×3): qty 1

## 2022-01-18 NOTE — Progress Notes (Signed)
[facility] Progress Note  3 Days Post-Op  Subjective: Patient seen and examined.  He is resting comfortably in bed.  He denies any abdominal pain at this time.  He confirms continuing to pass flatus, but denies any bowel movements.  He states he feels like he needs to have a bowel movement soon.  He is tolerating his diet without nausea and vomiting.  Objective: Vital signs in last 24 hours: Temp:  [97.9 F (36.6 C)-98.8 F (37.1 C)] 97.9 F (36.6 C) (12/10 0500) Pulse Rate:  [58-73] 58 (12/10 0500) Resp:  [18-20] 18 (12/10 0500) BP: (125-129)/(63-96) 129/63 (12/10 0500) SpO2:  [98 %-100 %] 100 % (12/10 0500) Last BM Date : 01/14/22  Intake/Output from previous day: 12/09 0701 - 12/10 0700 In: 2790.2 [P.O.:1580; I.V.:760.2; IV Piggyback:450] Out: 1700 [Urine:1700] Intake/Output this shift: Total I/O In: 360 [P.O.:360] Out: -   General appearance: alert, cooperative, and no distress GI: Abdomen soft, nondistended, no percussion tenderness, nontender to palpation; no rigidity, guarding, rebound tenderness; incisions C/D/I skin staples in place  Lab Results:  Recent Labs    01/17/22 0438 01/18/22 0507 01/18/22 0705  WBC 11.7* 9.4  --   HGB 13.3 9.8* 9.6*  HCT 42.7 31.4* 31.1*  PLT 184 236  --    BMET Recent Labs    01/17/22 0438 01/18/22 0507  NA 141 138  K 3.2* 3.2*  CL 112* 108  CO2 22 26  GLUCOSE 96 91  BUN 26* 21  CREATININE 0.91 0.84  CALCIUM 8.4* 8.4*   PT/INR No results for input(s): "LABPROT", "INR" in the last 72 hours.  Studies/Results: DG Abd 2 Views  Result Date: 01/17/2022 CLINICAL DATA:  Postoperative ileus versus small-bowel obstruction. EXAM: ABDOMEN - 2 VIEW COMPARISON:  01/15/2022 FINDINGS: NG tube remains in the proximal stomach. Right ureteral stent in place. Decreasing bowel distention since prior study. Slightly prominent left abdominal small bowel loops. Gas within nondistended colon. No free air or organomegaly.  IMPRESSION: Improving bowel gas pattern with a few mildly prominent left abdominal small bowel loops. This could reflect resolving obstruction or ileus. Electronically Signed   By: Rolm Baptise M.D.   On: 01/17/2022 09:41    Anti-infectives: Anti-infectives (From admission, onward)    Start     Dose/Rate Route Frequency Ordered Stop   01/15/22 1007  ceFAZolin (ANCEF) 2-4 GM/100ML-% IVPB       Note to Pharmacy: Abbie Sons S: cabinet override      01/15/22 1007 01/15/22 1036   01/15/22 0930  ceFAZolin (ANCEF) IVPB 2g/100 mL premix        2 g 200 mL/hr over 30 Minutes Intravenous On call 01/15/22 0918 01/15/22 1102       Assessment/Plan:  Patient is a 71 year old male who was admitted with small bowel obstruction.  He is status post exploratory laparotomy and small bowel resection on 12/7.   -Leukocytosis resolved, 9.4 this a.m. -Diet advanced to full liquids -Will hold on advancing to solid food until patient begins to have bowel movements -Patient understands that there is a low threshold for NG tube replacement if he begins to have nausea or vomiting -IV fluids -Continue Dulcolax suppositories -Ordered MiraLAX and Senokot for the patient as well today -Monitor for return of bowel function -Called and left voicemail for brother -Appreciate hospitalist recommendations   LOS: 6 days    Chandler Stofer A Miata Culbreth 01/18/2022

## 2022-01-18 NOTE — Progress Notes (Addendum)
PROGRESS NOTE   Luke Foster, is a 71 y.o. male, DOB - 1950-12-15, ZOX:[id number]  Admit date - 01/12/2022   Admitting Physician Bernadette Hoit, DO  Outpatient Primary MD for the patient is [name], MD  LOS - 6  Chief Complaint  Patient presents with   Abdominal Pain      Brief Narrative:   71 y.o. male with medical history significant of  chronic back and neck pain with spinal cord stimulator, lumbosacral radiculopathy with spinal cord stimulator, history of nephrolithiasis, history of bilateral hydronephrosis, hyperlipidemia, neurogenic bladder with indwelling Foley, chronic constipation admitted 01/02/2022 with concerns about small bowel obstruction and mildly obstructed right ureteral stent with mild right hydronephrosis    -Assessment and Plan:  SBO---  -s/p ex lap and resect section of small bowel and scarring ,  pathology  pending -He has skin staples in place, which will be removed in 2 weeks  -ng removed on 12/9, -diet order per gen surg , appreciate input  Post op anemia Hgn 9.6 Does not appear to have overt bleeding, monitor   Leukocytosis -Likely reactive from surgery, normalized  Hypokalemia, remain low  ,continue to replace, recheck in the morning, mag 2.2   Rt sided Hydronephrosis-CT abdomen and pelvis shows Right ureteral stent with proximal pigtail at the right ureteropelvic junction and distal pigtail within the urinary bladder lumen. Associated urothelial thickening of the right ureter and mild right hydronephrosis. - last sent placed for kidney stone on 11/27, foley exchanged on 11/27 by Dr Gloriann Loan, he missed appoint with Dr Gloriann Loan on 12/5 due to hospitalization -family requested stent to be removed while he is in the hospital, Discussed with on-call urologist Dr. Alyson Ingles who recommend outpatient follow up with  Dr Gloriann Loan in [place] or Dr Alyson Ingles in [place] ( if patient does not desire to travel to [facility]) for stent removal after discharge,   urology does not have the equipment in the hospital, this has to be done in urology's office     HTN--bp stable on current regimen   FTT/ chronic physical debility  , mostly bed to wheelchair, able to walk with a walker for a short distance at home, has trapeze bar at home to help him get out of bed, ordered here per request  --chronic opiate use, on iv fentanyl when npo, resume home oral meds when able to take po meds   Depression and anxiety---stable     Disposition: The patient is from: Home              Anticipated d/c is to: Home with [facility], he declined snf, he appears to have good family support              Anticipated d/c date is: TBD, need to be able to advance diet before d/c              Patient currently is not medically stable to d/c.   Code Status :  -  Code Status: Prior   Family Communication:   Brother  [name] at bedside on 12/7, over the phone on 12/8, 12/9, 12/10 brother requests to be updated daily by [facility] DVT Prophylaxis  :   - SCDs  SCDs Start: 01/13/22 1318   Lab Results  Component Value Date   PLT 236 01/18/2022   Inpatient Medications  Scheduled Meds:  acetaminophen  1,000 mg Oral Q6H   bisacodyl  10 mg Rectal Daily   Chlorhexidine Gluconate Cloth  6 each Topical Daily  diltiazem  120 mg Oral QPM   methocarbamol  500 mg Oral TID   polyethylene glycol  17 g Oral Daily   senna-docusate  2 tablet Oral BID   venlafaxine XR  75 mg Oral Q breakfast   Continuous Infusions:   PRN Meds:.fentaNYL (SUBLIMAZE) injection, labetalol, LORazepam, oxyCODONE, rOPINIRole, traZODone   Anti-infectives (From admission, onward)    Start     Dose/Rate Route Frequency Ordered Stop   01/15/22 1007  ceFAZolin (ANCEF) 2-4 GM/100ML-% IVPB       Note to Pharmacy: Abbie Sons S: cabinet override      01/15/22 1007 01/15/22 1036   01/15/22 0930  ceFAZolin (ANCEF) IVPB 2g/100 mL premix        2 g 200 mL/hr over 30 Minutes Intravenous On call 01/15/22 0918 01/15/22 1102        Subjective:  POD#3,  passing gas, gen surg at bedside, plan to advance diet to full liquid    Objective: Vitals:   01/17/22 1539 01/17/22 2058 01/18/22 0500 01/18/22 1452  BP: (!) 125/96 126/68 129/63 113/72  Pulse: 73 (!) 59 (!) 58 61  Resp: '20 18 18 20  '$ Temp: 98.8 F (37.1 C) 97.9 F (36.6 C) 97.9 F (36.6 C) 97.9 F (36.6 C)  TempSrc: Oral  Oral Oral  SpO2: 98% 99% 100% 99%  Weight:      Height:        Intake/Output Summary (Last 24 hours) at 01/18/2022 1703 Last data filed at 01/18/2022 0959 Gross per 24 hour  Intake 1699.57 ml  Output 1000 ml  Net 699.57 ml   Filed Weights   01/12/22 1424 01/15/22 0949  Weight: 95.3 kg 95 kg   Physical Exam  Gen:- Awake Alert, in no acute distress HEENT:- [place].AT, No sclera icterus Nose-+ NG tube  Neck-Supple Neck,No JVD,.  Lungs-  CTAB , fair symmetrical air movement CV- S1, S2 normal, regular  Abd-post op changes , + bowel sounds, , extremity/Skin:- No  edema, pedal pulses present  Psych-affect is appropriate, oriented x3 Neuro-chronic neuromuscular deficits especially of the right lower extremity , no additional new focal deficits, no tremors  Data Reviewed: I have personally reviewed following labs and imaging studies  CBC: Recent Labs  Lab 01/12/22 1556 01/14/22 0508 01/15/22 0633 01/16/22 0552 01/17/22 0438 01/18/22 0507 01/18/22 0705  WBC 14.8* 10.4 11.5* 18.3* 11.7* 9.4  --   NEUTROABS 12.9*  --   --  16.0* 9.8* 6.8  --   HGB 12.6* 11.7* 12.3* 12.2* 13.3 9.8* 9.6*  HCT 39.8 37.5* 39.0 38.1* 42.7 31.4* 31.1*  MCV 92.3 93.1 92.4 92.9 95.3 94.0  --   PLT 359 295 311 302 184 236  --    Basic Metabolic Panel: Recent Labs  Lab 01/14/22 0508 01/15/22 0633 01/16/22 0552 01/17/22 0438 01/18/22 0507  NA 139 141 142 141 138  K 3.7 3.2* 3.5 3.2* 3.2*  CL 107 103 107 112* 108  CO2 '23 26 24 22 26  '$ GLUCOSE 90 99 113* 96 91  BUN 18 23 28* 26* 21  CREATININE 1.07 1.13 1.08 0.91 0.84  CALCIUM 9.1 9.2  8.8* 8.4* 8.4*  MG  --  2.3 2.3  --  2.2   GFR: Estimated Creatinine Clearance: 96 mL/min (by C-G formula based on SCr of 0.84 mg/dL). Liver Function Tests: Recent Labs  Lab 01/12/22 1556 01/14/22 0508  AST 26 20  ALT 24 18  ALKPHOS 80 69  BILITOT 0.6 0.8  PROT 8.0  7.0  ALBUMIN 3.9 3.4*   Radiology Studies: DG Abd 2 Views  Result Date: 01/17/2022 CLINICAL DATA:  Postoperative ileus versus small-bowel obstruction. EXAM: ABDOMEN - 2 VIEW COMPARISON:  01/15/2022 FINDINGS: NG tube remains in the proximal stomach. Right ureteral stent in place. Decreasing bowel distention since prior study. Slightly prominent left abdominal small bowel loops. Gas within nondistended colon. No free air or organomegaly. IMPRESSION: Improving bowel gas pattern with a few mildly prominent left abdominal small bowel loops. This could reflect resolving obstruction or ileus. Electronically Signed   By: Rolm Baptise M.D.   On: 01/17/2022 09:41    Scheduled Meds:  acetaminophen  1,000 mg Oral Q6H   bisacodyl  10 mg Rectal Daily   Chlorhexidine Gluconate Cloth  6 each Topical Daily   diltiazem  120 mg Oral QPM   methocarbamol  500 mg Oral TID   polyethylene glycol  17 g Oral Daily   senna-docusate  2 tablet Oral BID   venlafaxine XR  75 mg Oral Q breakfast   Continuous Infusions:     LOS: 6 days   Florencia Reasons M.D PhD FACP on 01/18/2022 at 5:03 PM  Go to [email or web address] - for contact info  [facility] - Office  [phone]  If 7PM-7AM, please contact night-coverage [email or web address] 01/18/2022, 5:03 PM

## 2022-01-18 NOTE — Progress Notes (Signed)
Pt c/o chronic bilateral lower extremity pain that he contributes to nerve damage. Pain 10/10 despite oxycodone, Requip, tylenol, and fentanyl administration. Midline incision with honeycomb dressing in place. No drainage present. Faint bowel sounds. Abdomen taut and distended however, patient reports passing gas. Pt has denied abdominal pain through out night. No acute events overnight. Bryson Corona Edd Fabian

## 2022-01-19 LAB — CBC WITH DIFFERENTIAL/PLATELET
Abs Immature Granulocytes: 0.03 10*3/uL (ref 0.00–0.07)
Basophils Absolute: 0.1 10*3/uL (ref 0.0–0.1)
Basophils Relative: 1 %
Eosinophils Absolute: 0.6 10*3/uL — ABNORMAL HIGH (ref 0.0–0.5)
Eosinophils Relative: 8 %
HCT: 33.7 % — ABNORMAL LOW (ref 39.0–52.0)
Hemoglobin: 10.4 g/dL — ABNORMAL LOW (ref 13.0–17.0)
Immature Granulocytes: 0 %
Lymphocytes Relative: 22 %
Lymphs Abs: 1.6 10*3/uL (ref 0.7–4.0)
MCH: 29.1 pg (ref 26.0–34.0)
MCHC: 30.9 g/dL (ref 30.0–36.0)
MCV: 94.1 fL (ref 80.0–100.0)
Monocytes Absolute: 0.3 10*3/uL (ref 0.1–1.0)
Monocytes Relative: 5 %
Neutro Abs: 4.7 10*3/uL (ref 1.7–7.7)
Neutrophils Relative %: 64 %
Platelets: 272 10*3/uL (ref 150–400)
RBC: 3.58 MIL/uL — ABNORMAL LOW (ref 4.22–5.81)
RDW: 12.6 % (ref 11.5–15.5)
WBC: 7.3 10*3/uL (ref 4.0–10.5)
nRBC: 0 % (ref 0.0–0.2)

## 2022-01-19 LAB — BASIC METABOLIC PANEL
Anion gap: 4 — ABNORMAL LOW (ref 5–15)
BUN: 18 mg/dL (ref 8–23)
CO2: 25 mmol/L (ref 22–32)
Calcium: 8.4 mg/dL — ABNORMAL LOW (ref 8.9–10.3)
Chloride: 108 mmol/L (ref 98–111)
Creatinine, Ser: 0.99 mg/dL (ref 0.61–1.24)
GFR, Estimated: >60 mL/min (ref >60)
Glucose, Bld: 84 mg/dL (ref 70–99)
Potassium: 3.2 mmol/L — ABNORMAL LOW (ref 3.5–5.1)
Sodium: 137 mmol/L (ref 135–145)

## 2022-01-19 LAB — SURGICAL PATHOLOGY

## 2022-01-19 MED ORDER — POTASSIUM CHLORIDE CRYS ER 20 MEQ PO TBCR
40.0000 meq | EXTENDED_RELEASE_TABLET | Freq: Once | ORAL | Status: AC
  Administered 2022-01-19: 40 meq via ORAL
  Filled 2022-01-19: qty 2

## 2022-01-19 NOTE — Progress Notes (Signed)
PROGRESS NOTE  CASANOVA SCHURMAN  NIO:[id number] DOB: [birth year] DOA: 01/12/2022 PCP: [name], MD   Brief Narrative: Patient is a 71 year old male with history of chronic back pain, neck pain status post spinal cord stimulator placement, lumbosacral radiculopathy, nephrolithiasis, bilateral hydronephrosis, hyperlipidemia, neurogenic bladder with Foley catheter who was admitted with complaint of abdominal pain.  Patient was found to have SBO.  General surgery consulted.  Status post exploratory laparotomy and small bowel resection on 12/7.  Diet advanced to soft today.  Possible discharge tomorrow after surgical clearance  Assessment & Plan:  Principal Problem:   Small bowel obstruction (HCC) Active Problems:   Benign essential HTN   Chronic bilateral low back pain without sciatica   Leukocytosis   Anxiety   Depression   Restless leg syndrome  SBO: Presented with abdominal pain.  Status post explorative laparotomy and bowel resection.  NG tube removed on 12/9.  Tolerating full liquid.  Diet advanced to soft.  Management as per general surgery. Continue bowel regimen  Normocytic anemia: Currently hemoglobin stable in the range of 9-10  Leukocytosis: Resolved  Hypokalemia: Being supplemented and monitored.  Right-sided hydronephrosis: CT abdomen/pelvis showed right ureteral stent with proximal pigtail at the right ureteropelvic junction and distal pigtail within the urinary bladder lumen.Family requested stent to be removed while he is in the hospital, Discussed with on-call urologist Dr. Alyson Ingles who recommend outpatient follow up with Dr Gloriann Loan in [place] or Dr Alyson Ingles in [place] ( if patient does not desire to travel to [facility]) for stent removal after discharge, urology does not have the equipment in the hospital, this has to be done in urology's office   Hypertension: Currently BP stable.  Continue current regimen  Failure to thrive/chronic physical debility: He is  mostly bed to wheelchair.  Able to walk with a walker for short distance at home  Depression/anxiety: Stable.  On Effexor.          DVT prophylaxis:SCDs Start: 01/13/22 1318     Code Status: Prior  Family Communication: Wife at bedside  Patient status:Inpatient  Patient is from :home  Anticipated discharge GH:[facility]  Estimated DC date:tomorrow   Consultants: General surgery  Procedures: None  Antimicrobials:  Anti-infectives (From admission, onward)    Start     Dose/Rate Route Frequency Ordered Stop   01/15/22 1007  ceFAZolin (ANCEF) 2-4 GM/100ML-% IVPB       Note to Pharmacy: Abbie Sons S: cabinet override      01/15/22 1007 01/15/22 1036   01/15/22 0930  ceFAZolin (ANCEF) IVPB 2g/100 mL premix        2 g 200 mL/hr over 30 Minutes Intravenous On call 01/15/22 0918 01/15/22 1102       Subjective: Patient seen and examined bedside today.  Hemodynamically stable.  Comfortable without any complaints , denies any abdominal pain.  Had a bowel movement last night.  No nausea or vomiting.  Objective: Vitals:   01/18/22 0500 01/18/22 1452 01/18/22 2033 01/19/22 0340  BP: 129/63 113/72 128/76 129/76  Pulse: (!) 58 61 73 69  Resp: '18 20 16 12  '$ Temp: 97.9 F (36.6 C) 97.9 F (36.6 C) 98.1 F (36.7 C) 97.7 F (36.5 C)  TempSrc: Oral Oral Oral   SpO2: 100% 99% 99% 100%  Weight:      Height:        Intake/Output Summary (Last 24 hours) at 01/19/2022 1256 Last data filed at 01/19/2022 0932 Gross per 24 hour  Intake 600 ml  Output 1625  ml  Net -1025 ml   Filed Weights   01/12/22 1424 01/15/22 0949  Weight: 95.3 kg 95 kg    Examination:  General exam: Overall comfortable, not in distress HEENT: PERRL Respiratory system:  no wheezes or crackles  Cardiovascular system: S1 & S2 heard, RRR.  Gastrointestinal system: Abdomen is nondistended, soft and nontender.  Surgical wound with sutures.  Good bowel sounds heard Central nervous system: Alert and  oriented Extremities: No edema, no clubbing ,no cyanosis Skin: No rashes, no ulcers,no icterus     Data Reviewed: I have personally reviewed following labs and imaging studies  CBC: Recent Labs  Lab 01/12/22 1556 01/14/22 0508 01/15/22 0633 01/16/22 0552 01/17/22 0438 01/18/22 0507 01/18/22 0705 01/19/22 0423  WBC 14.8*   < > 11.5* 18.3* 11.7* 9.4  --  7.3  NEUTROABS 12.9*  --   --  16.0* 9.8* 6.8  --  4.7  HGB 12.6*   < > 12.3* 12.2* 13.3 9.8* 9.6* 10.4*  HCT 39.8   < > 39.0 38.1* 42.7 31.4* 31.1* 33.7*  MCV 92.3   < > 92.4 92.9 95.3 94.0  --  94.1  PLT 359   < > 311 302 184 236  --  272   < > = values in this interval not displayed.   Basic Metabolic Panel: Recent Labs  Lab 01/15/22 0633 01/16/22 0552 01/17/22 0438 01/18/22 0507 01/19/22 0423  NA 141 142 141 138 137  K 3.2* 3.5 3.2* 3.2* 3.2*  CL 103 107 112* 108 108  CO2 '26 24 22 26 25  '$ GLUCOSE 99 113* 96 91 84  BUN 23 28* 26* 21 18  CREATININE 1.13 1.08 0.91 0.84 0.99  CALCIUM 9.2 8.8* 8.4* 8.4* 8.4*  MG 2.3 2.3  --  2.2  --      Recent Results (from the past 240 hour(s))  Urine Culture     Status: Abnormal   Collection Time: 01/12/22  3:57 PM   Specimen: Urine, Clean Catch  Result Value Ref Range Status   Specimen Description   Final    URINE, CLEAN CATCH Performed at [facility], [address] [place], [place] [address]    Special Requests   Final    NONE Performed at [facility], [address] [place], [place] [address]    Culture (A)  Final    >=100,000 COLONIES/mL ENTEROBACTER CLOACAE 40,000 COLONIES/mL PSEUDOMONAS AERUGINOSA    Report Status 01/15/2022 FINAL  Final   Organism ID, Bacteria ENTEROBACTER CLOACAE (A)  Final   Organism ID, Bacteria PSEUDOMONAS AERUGINOSA (A)  Final      Susceptibility   Enterobacter cloacae - MIC*    CEFAZOLIN >=64 RESISTANT Resistant     CEFEPIME 2 SENSITIVE Sensitive     CIPROFLOXACIN 1 RESISTANT Resistant     GENTAMICIN <=1 SENSITIVE Sensitive      IMIPENEM 2 SENSITIVE Sensitive     NITROFURANTOIN >=512 RESISTANT Resistant     TRIMETH/SULFA <=20 SENSITIVE Sensitive     PIP/TAZO 64 INTERMEDIATE Intermediate     * >=100,000 COLONIES/mL ENTEROBACTER CLOACAE   Pseudomonas aeruginosa - MIC*    CEFTAZIDIME 16 INTERMEDIATE Intermediate     CIPROFLOXACIN 0.5 SENSITIVE Sensitive     GENTAMICIN <=1 SENSITIVE Sensitive     IMIPENEM 2 SENSITIVE Sensitive     * 40,000 COLONIES/mL PSEUDOMONAS AERUGINOSA     Radiology Studies: No results found.  Scheduled Meds:  acetaminophen  1,000 mg Oral Q6H   bisacodyl  10 mg  Rectal Daily   Chlorhexidine Gluconate Cloth  6 each Topical Daily   diltiazem  120 mg Oral QPM   methocarbamol  500 mg Oral TID   polyethylene glycol  17 g Oral Daily   senna-docusate  2 tablet Oral BID   venlafaxine XR  75 mg Oral Q breakfast   Continuous Infusions:   LOS: 7 days   Shelly Coss, MD [facility] P12/12/2021, 12:56 PM

## 2022-01-19 NOTE — Discharge Instructions (Addendum)
Surgery Discharge Instructions  Activity  You are advised to go directly home from the hospital.  Restrict your activities and rest for a day.  Resume light activity tomorrow. No heavy lifting over 10 lbs or strenuous exercise.  Fluids and Diet Regular diet  Medications  If you have not had a bowel movement in 24 hours, take 2 tablespoons over the counter Milk of mag.             You May resume your blood thinners tomorrow (Aspirin, coumadin, or other).   Operative Site  You have skin staples in place.  These will be removed at your follow up visit with me. Ok to Games developer. Keep wound clean and dry. No baths or swimming. No lifting more than 10 pounds.  Contact Information: If you have questions or concerns, please call our office, [phone], Monday- Thursday 8AM-5PM and Friday 8AM-12Noon.  If it is after hours or on the weekend, please call [facility] Main Number, [phone], and ask to speak to the surgeon on call for Dr. Okey Dupre at [facility].   SPECIFIC COMPLICATIONS TO WATCH FOR: Inability to urinate Fever over 101? F by mouth Nausea and vomiting lasting longer than 24 hours. Pain not relieved by medication ordered Swelling around the operative site Increased redness, warmth, hardness, around operative area Numbness, tingling, or cold fingers or toes Blood -soaked dressing, (small amounts of oozing may be normal) Increasing and progressive drainage from surgical area or exam site

## 2022-01-19 NOTE — Plan of Care (Signed)

## 2022-01-19 NOTE — Care Management Important Message (Signed)
Important Message  Patient Details  Name: Luke Foster MRN: [id number] Date of Birth: 07/03/1950   Medicare Important Message Given:  Yes (spoke with by phone at [phone] to review letter, no additonal copy needed.)     [name] Medal 01/19/2022, 11:50 AM

## 2022-01-19 NOTE — Progress Notes (Signed)
[facility] Progress Note  4 Days Post-Op  Subjective: Patient seen and examined.  He is resting comfortably in bed.  He denies significant abdominal pain.  He was tolerating his full liquids without nausea and vomiting.  He had a small liquid bowel movement yesterday, and continues to pass flatus.  Objective: Vital signs in last 24 hours: Temp:  [97.7 F (36.5 C)-98.1 F (36.7 C)] 97.7 F (36.5 C) (12/11 0340) Pulse Rate:  [61-73] 69 (12/11 0340) Resp:  [12-20] 12 (12/11 0340) BP: (113-129)/(72-76) 129/76 (12/11 0340) SpO2:  [99 %-100 %] 100 % (12/11 0340) Last BM Date : 01/18/22  Intake/Output from previous day: 12/10 0701 - 12/11 0700 In: 600 [P.O.:600] Out: 1625 [Urine:1625] Intake/Output this shift: Total I/O In: 360 [P.O.:360] Out: -   General appearance: alert, cooperative, and no distress GI: Abdomen soft, nondistended, no percussion tenderness, minimal incisional tenderness to palpation; no rigidity, guarding, rebound tenderness; incision C/D/I with skin staples in place  Lab Results:  Recent Labs    01/18/22 0507 01/18/22 0705 01/19/22 0423  WBC 9.4  --  7.3  HGB 9.8* 9.6* 10.4*  HCT 31.4* 31.1* 33.7*  PLT 236  --  272   BMET Recent Labs    01/18/22 0507 01/19/22 0423  NA 138 137  K 3.2* 3.2*  CL 108 108  CO2 26 25  GLUCOSE 91 84  BUN 21 18  CREATININE 0.84 0.99  CALCIUM 8.4* 8.4*   PT/INR No results for input(s): "LABPROT", "INR" in the last 72 hours.  Studies/Results: No results found.  Anti-infectives: Anti-infectives (From admission, onward)    Start     Dose/Rate Route Frequency Ordered Stop   01/15/22 1007  ceFAZolin (ANCEF) 2-4 GM/100ML-% IVPB       Note to Pharmacy: Abbie Sons S: cabinet override      01/15/22 1007 01/15/22 1036   01/15/22 0930  ceFAZolin (ANCEF) IVPB 2g/100 mL premix        2 g 200 mL/hr over 30 Minutes Intravenous On call 01/15/22 0918 01/15/22 1102       Assessment/Plan:  Patient  is a 71 year old male who was admitted with small bowel obstruction.  He is status post exploratory laparotomy and small bowel resection on 12/7.   -WBC 7.3. Hgb 10.4 -Advance diet to soft -IV fluids per hospitalist -Continue bowel regimen with MiraLAX, Senokot, and Dulcolax suppositories -Monitor for bowel movements because whenever you think you are actually getting up to go in recent reasonable timeframe you are never able to -Called and left voicemail for brother -Appreciate hospitalist recommendations   LOS: 7 days    Maylon Sailors A Shirley Bolle 01/19/2022

## 2022-01-20 ENCOUNTER — Encounter (HOSPITAL_COMMUNITY): Payer: Self-pay | Admitting: Surgery

## 2022-01-20 LAB — BASIC METABOLIC PANEL
Anion gap: 7 (ref 5–15)
BUN: 18 mg/dL (ref 8–23)
CO2: 20 mmol/L — ABNORMAL LOW (ref 22–32)
Calcium: 8.7 mg/dL — ABNORMAL LOW (ref 8.9–10.3)
Chloride: 113 mmol/L — ABNORMAL HIGH (ref 98–111)
Creatinine, Ser: 1.04 mg/dL (ref 0.61–1.24)
GFR, Estimated: >60 mL/min (ref >60)
Glucose, Bld: 83 mg/dL (ref 70–99)
Potassium: 4.1 mmol/L (ref 3.5–5.1)
Sodium: 140 mmol/L (ref 135–145)

## 2022-01-20 NOTE — Progress Notes (Signed)
[facility] Progress Note  5 Days Post-Op  Subjective: Patient seen and examined.  He is resting comfortably in bed.  He is tolerating a soft diet without nausea or vomiting.  He has had a couple of bowel movements in the last 24 hours.  Pain is adequately controlled.  Objective: Vital signs in last 24 hours: Temp:  [97.9 F (36.6 C)-98.3 F (36.8 C)] 98.3 F (36.8 C) (12/12 0548) Pulse Rate:  [77-78] 77 (12/12 0548) Resp:  [18-20] 20 (12/12 0548) BP: (109-132)/(66-81) 131/74 (12/12 0836) SpO2:  [99 %-100 %] 99 % (12/12 0548) Last BM Date : 01/19/22  Intake/Output from previous day: 12/11 0701 - 12/12 0700 In: 360 [P.O.:360] Out: 1400 [Urine:1400] Intake/Output this shift: Total I/O In: 240 [P.O.:240] Out: -   General appearance: alert, cooperative, and no distress GI: Abdomen soft, nondistended, no percussion tenderness, mild tenderness to palpation; no rigidity, guarding, rebound tenderness; midline incision C/D/I with skin staples in place  Lab Results:  Recent Labs    01/18/22 0507 01/18/22 0705 01/19/22 0423  WBC 9.4  --  7.3  HGB 9.8* 9.6* 10.4*  HCT 31.4* 31.1* 33.7*  PLT 236  --  272   BMET Recent Labs    01/19/22 0423 01/20/22 0401  NA 137 140  K 3.2* 4.1  CL 108 113*  CO2 25 20*  GLUCOSE 84 83  BUN 18 18  CREATININE 0.99 1.04  CALCIUM 8.4* 8.7*   PT/INR No results for input(s): "LABPROT", "INR" in the last 72 hours.  Studies/Results: No results found.  Anti-infectives: Anti-infectives (From admission, onward)    Start     Dose/Rate Route Frequency Ordered Stop   01/15/22 1007  ceFAZolin (ANCEF) 2-4 GM/100ML-% IVPB       Note to Pharmacy: Abbie Sons S: cabinet override      01/15/22 1007 01/15/22 1036   01/15/22 0930  ceFAZolin (ANCEF) IVPB 2g/100 mL premix        2 g 200 mL/hr over 30 Minutes Intravenous On call 01/15/22 0918 01/15/22 1102      Pathology: A. MESENTERIC SCARRING:  - Sclerotic fibroadipose  tissue with focal fat necrosis, consistent with  scar  - Benign lymph node  - No evidence of malignancy   B. SMALL BOWEL, RESECTION:  - Segment of small intestine (20 cm) showing areas of congestion and  hemorrhage  - Margins appear viable  - Benign lymph node   Assessment/Plan:  Patient is a 70 year old male who was admitted with small bowel obstruction.  He is status post exploratory laparotomy and small bowel resection on 12/7.   -Patient tolerating soft diet, moving his bowels, and pain controlled -Pathology demonstrates benign scar without evidence of malignancy -Stable for discharge from general surgery standpoint -Patient will follow-up with me in 2 weeks for staple removal   LOS: 8 days    Tori Dattilio A Gilberta Peeters 01/20/2022

## 2022-01-20 NOTE — Discharge Summary (Signed)
Physician Discharge Summary  NIKKO GOLDWIRE ZYS:[id number] DOB: 03/22/1950 DOA: 01/12/2022  PCP: [name], MD  Admit date: 01/12/2022 Discharge date: 01/20/2022  Admitted From: Home Disposition:  Home  Discharge Condition:Stable CODE STATUS:FULL Diet recommendation: Heart Healthy  Brief/Interim Summary: Patient is a 71 year old male with history of chronic back pain, neck pain status post spinal cord stimulator placement, lumbosacral radiculopathy, nephrolithiasis, bilateral hydronephrosis, hyperlipidemia, neurogenic bladder with Foley catheter who was admitted with complaint of abdominal pain. Patient was found to have SBO. General surgery consulted. Status post exploratory laparotomy and small bowel resection on 12/7. Diet advanced to soft and he is tolerating.  General surgery cleared for discharge today to home.  He will follow-up with general surgery as an outpatient.  Following problems were addressed during his hospitalization:  SBO: Presented with abdominal pain.  Status post explorative laparotomy and bowel resection.  NG tube removed on 12/9.  Tolerating  soft diet.  Follow-up with general surgery as an outpatient.  Continue bowel regimen   Normocytic anemia: Currently hemoglobin stable in the range of 9-10   Leukocytosis: Resolved   Hypokalemia: Supplemented and corrected   Right-sided hydronephrosis: CT abdomen/pelvis sho wed right ureteral stent with proximal pigtail at the right ureteropelvic junction and distal pigtail within the urinary bladder lumen.Family requested stent to be removed while he is in the hospital, Discussed with on-call urologist Dr. Alyson Ingles who recommend outpatient follow up with Dr Gloriann Loan in [place] or Dr Alyson Ingles in [place] ( if patient does not desire to travel to [facility]) for stent removal after discharge, urology does not have the equipment in the hospital, this has to be done in urology's office    Hypertension: Currently BP stable.   Continue current home regimen   Failure to thrive/chronic physical debility: He is mostly bed to wheelchair.  Able to walk with a walker for short distance at home.  PT recommended skilled nursing facility on discharge but patient is interested to go home.HH arranged   Depression/anxiety: Stable.  On Effexor.  Discharge Diagnoses:  Principal Problem:   Small bowel obstruction ([place]) Active Problems:   Benign essential HTN   Chronic bilateral low back pain without sciatica   Leukocytosis   Anxiety   Depression   Restless leg syndrome    Discharge Instructions  Discharge Instructions     Diet - low sodium heart healthy   Complete by: As directed    Discharge instructions   Complete by: As directed    1)Please follow up with general surgery and urology as an outpatient 2)Follow up with your PCP in a week   Increase activity slowly   Complete by: As directed    No wound care   Complete by: As directed       Allergies as of 01/20/2022       Reactions   Erythromycin Itching        Medication List     TAKE these medications    acetaminophen 500 MG tablet Commonly known as: TYLENOL Take 1,000 mg by mouth every 6 (six) hours as needed for mild pain or moderate pain.   ascorbic acid 500 MG tablet Commonly known as: VITAMIN C Take 500 mg by mouth daily.   cholecalciferol 25 MCG (1000 UNIT) tablet Commonly known as: VITAMIN D3 Take 2 tablets (2,000 Units total) by mouth daily.   cyanocobalamin 500 MCG tablet Commonly known as: VITAMIN B12 Take 2 tablets (1,000 mcg total) by mouth 2 (two) times daily.   diltiazem 120  MG 24 hr capsule Commonly known as: CARDIZEM CD Take 1 capsule (120 mg total) by mouth every evening.   docusate sodium 100 MG capsule Commonly known as: COLACE Take 1 capsule (100 mg total) by mouth in the morning, at noon, and at bedtime. What changed:  how much to take when to take this   linaclotide 145 MCG Caps capsule Commonly known  as: LINZESS Take 1 capsule (145 mcg total) by mouth daily before breakfast. What changed:  when to take this reasons to take this   mirabegron ER 50 MG Tb24 tablet Commonly known as: MYRBETRIQ Take 1 tablet (50 mg total) by mouth daily.   ondansetron 4 MG tablet Commonly known as: ZOFRAN Take 1 tablet (4 mg total) by mouth every 8 (eight) hours as needed for nausea or vomiting.   oxyCODONE 15 MG immediate release tablet Commonly known as: ROXICODONE Take 1 tablet (15 mg total) by mouth every 6 (six) hours as needed for pain.   polyethylene glycol 17 g packet Commonly known as: MIRALAX / GLYCOLAX Take 17 g by mouth 2 (two) times daily. What changed:  when to take this reasons to take this   rOPINIRole 0.5 MG tablet Commonly known as: REQUIP Take 0.5 mg by mouth at bedtime as needed (restless legs).   solifenacin 5 MG tablet Commonly known as: VESICARE Take 5 mg by mouth daily.   sulfamethoxazole-trimethoprim 400-80 MG tablet Commonly known as: BACTRIM Take 1 tablet by mouth daily.   tamsulosin 0.4 MG Caps capsule Commonly known as: FLOMAX Take 1 capsule (0.4 mg total) by mouth every evening.   venlafaxine XR 75 MG 24 hr capsule Commonly known as: EFFEXOR-XR Take 1 capsule (75 mg total) by mouth in the morning and at bedtime.        Follow-up Information     [facility] Follow up.   Specialty: Home Health Services Why: agency will provide [facility] services Contact information: [place] [address] [phone]         Cleon Gustin, MD Follow up.   Specialty: Urology Why: for stent removal Contact information: [place]  [place] [address] [phone]         PappayliouBarnetta Chapel A, DO Follow up.   Specialty: General Surgery Contact information: [address] [facility] [address] [phone]                Allergies  Allergen Reactions   Erythromycin Itching     Consultations: General Surgery   Procedures/Studies: DG Abd 2 Views  Result Date: 01/17/2022 CLINICAL DATA:  Postoperative ileus versus small-bowel obstruction. EXAM: ABDOMEN - 2 VIEW COMPARISON:  01/15/2022 FINDINGS: NG tube remains in the proximal stomach. Right ureteral stent in place. Decreasing bowel distention since prior study. Slightly prominent left abdominal small bowel loops. Gas within nondistended colon. No free air or organomegaly. IMPRESSION: Improving bowel gas pattern with a few mildly prominent left abdominal small bowel loops. This could reflect resolving obstruction or ileus. Electronically Signed   By: Rolm Baptise M.D.   On: 01/17/2022 09:41   DG Abd Portable 1V-Small Bowel Obstruction Protocol-initial, 8 hr delay  Result Date: 01/15/2022 CLINICAL DATA:  Small bowel obstruction EXAM: PORTABLE ABDOMEN - 1 VIEW COMPARISON:  Radiographs dated January 14, 2022 FINDINGS: Enteric tube tip and side port are in the stomach. Small amount of contrast material seen in the stomach. Numerous dilated loops of small bowel, similar to prior exam. Scattered  colonic gas. Unchanged position of right ureter stent. Visualized lung bases are clear. IMPRESSION: Dilated loops of small bowel, similar to prior exam. Electronically Signed   By: Yetta Glassman M.D.   On: 01/15/2022 08:56   DG Abd Portable 1V-Small Bowel Obstruction Protocol-initial, 8 hr delay  Result Date: 01/14/2022 CLINICAL DATA:  8 hour delay exam. EXAM: PORTABLE ABDOMEN - 1 VIEW COMPARISON:  Earlier radiograph dated 01/14/2022. FINDINGS: Partially visualized enteric tube in the left upper abdomen. Small amount of oral contrast noted adjacent to the tip of the tube. No oral contrast noted within the bowel loops. Persistent air distended loops of small bowel measuring up to 4.5 cm in diameter. Right-sided pigtail ureteral catheter. Osteopenia with degenerative changes and lower lumbar fusion. No acute osseous pathology.  IMPRESSION: Persistent air distended loops of small bowel. Electronically Signed   By: Anner Crete M.D.   On: 01/14/2022 20:01   DG Abd Portable 1V-Small Bowel Protocol-Position Verification  Result Date: 01/14/2022 CLINICAL DATA:  Nasogastric tube placement, dilated small bowel question small-bowel obstruction EXAM: PORTABLE ABDOMEN - 1 VIEW COMPARISON:  Portable exam 1023 hours compared to 01/12/2022 FINDINGS: Nasogastric tube projects over proximal stomach, proximal side-port at or near GE junction region. Numerous dilated loops of small bowel in the mid and upper abdomen. Relative decrease in amount of colon gas versus small bowel consistent with small bowel obstruction. No bowel wall thickening. RIGHT ureteral stent and postsurgical changes of lower lumbar fusion noted. Bones demineralized. IMPRESSION: Increased small bowel dilatation likely representing small bowel obstruction. Electronically Signed   By: Lavonia Dana M.D.   On: 01/14/2022 10:47   DG CHEST PORT 1 VIEW  Result Date: 01/13/2022 CLINICAL DATA:  NG tube placement EXAM: PORTABLE CHEST 1 VIEW COMPARISON:  01/12/2022 FINDINGS: Esophagogastric tube with tip and side port below the diaphragm. The heart size and mediastinal contours are within normal limits. Both lungs are clear. The visualized skeletal structures are unremarkable. IMPRESSION: Esophagogastric tube with tip and side port below the diaphragm. Electronically Signed   By: Delanna Ahmadi M.D.   On: 01/13/2022 12:11   DG Chest Port 1 View  Result Date: 01/12/2022 CLINICAL DATA:  NG tube placement EXAM: PORTABLE CHEST 1 VIEW COMPARISON:  Chest x-ray 06/17/2020.  Abdominal x-ray 01/12/2022. FINDINGS: Nasogastric tube tip is in the gastric fundus. Lungs are clear. There is no pleural effusion or pneumothorax. The cardiomediastinal silhouette is within normal limits. No acute fractures are seen. IMPRESSION: Nasogastric tube tip is in the gastric fundus. Electronically Signed   By:  Ronney Asters M.D.   On: 01/12/2022 21:34   DG Abd Portable 1 View  Result Date: 01/12/2022 CLINICAL DATA:  NGT placement EXAM: PORTABLE ABDOMEN - 1 VIEW COMPARISON:  12/01/2021. FINDINGS: Gaseous distention of the small bowel loops overlying the upper abdomen. No NG tube is seen on this examination. IMPRESSION: Dilated bowel consistent with ileus or obstruction. If there is an NG tube in place, it is not visualized. Electronically Signed   By: Sammie Bench M.D.   On: 01/12/2022 19:55   CT Abdomen Pelvis W Contrast  Result Date: 01/12/2022 CLINICAL DATA:  Bowel obstruction suspected EXAM: CT ABDOMEN AND PELVIS WITH CONTRAST TECHNIQUE: Multidetector CT imaging of the abdomen and pelvis was performed using the standard protocol following bolus administration of intravenous contrast. RADIATION DOSE REDUCTION: This exam was performed according to the departmental dose-optimization program which includes automated exposure control, adjustment of the mA and/or kV according to patient size and/or use  of iterative reconstruction technique. CONTRAST:  154m OMNIPAQUE IOHEXOL 300 MG/ML  SOLN COMPARISON:  None Available. FINDINGS: Lower chest: No acute abnormality.  Coronary artery calcification. Hepatobiliary: No focal liver abnormality. No gallstones, gallbladder wall thickening, or pericholecystic fluid. No biliary dilatation. Pancreas: No focal lesion. Normal pancreatic contour. No surrounding inflammatory changes. No main pancreatic ductal dilatation. Spleen: Normal in size without focal abnormality. Adrenals/Urinary Tract: No adrenal nodule bilaterally. Bilateral kidneys enhance symmetrically. Bilateral fluid density lesions likely represent simple renal cysts measuring up to 12 cm on the left. Right nephrolithiasis measuring up to 2 mm. No left nephrolithiasis. No ureterolithiasis bilaterally. Right ureteral stent with proximal pigtail at the right ureteropelvic junction and distal pigtail within the urinary  bladder lumen. Associated urothelial thickening of the right ureter and mild right hydronephrosis. No left hydroureteronephrosis. The urinary bladder is decompressed with Foley catheter tip and balloon terminating within the urinary bladder lumen. On delayed imaging, there is no urothelial wall thickening and there are no filling defects in the opacified portions of the bilateral collecting systems or ureters. Stomach/Bowel: Stomach is within normal limits. Gastric, proximal small bowel, as well as mid to distal small bowel fluid dilatation with air-fluid levels. Transition point noted within the right mid abdomen (2:46, 6:59). Associated small bowel mesenteric edema. No pneumatosis. No evidence of large bowel wall thickening or dilatation. Appendix appears normal. Vascular/Lymphatic: No abdominal aorta or iliac aneurysm. Severe atherosclerotic plaque of the aorta and its branches. No abdominal, pelvic, or inguinal lymphadenopathy. Reproductive: Enlarged prostate measuring up to 4.6 cm with mass effect on the posterior wall of the urinary bladder. Other: No intraperitoneal free fluid. No intraperitoneal free gas. No organized fluid collection. Musculoskeletal: No abdominal wall hernia or abnormality. Diffusely decreased bone density. No suspicious lytic or blastic osseous lesions. No acute displaced fracture. Multilevel degenerative changes of the spine. Multilevel chronic vertebral body height loss involving the T12 and L1 levels. Posterolateral and interbody surgical hardware fusion of the L4-S1 levels. IMPRESSION: 1. High-grade small-bowel obstruction with a transition point within the right mid abdomen. Associated mesenteric edema. No pneumatosis. Underlying internal hernia is not fully excluded. No perforation. 2. Mildly obstructive right ureteral stent: proximal pigtail at the right ureteropelvic junction with associated urothelial thickening of the right ureter and mild right hydronephrosis. Recommend  urologic consultation. Correlate with urinalysis for superimposed infection. 3. Nonobstructive right nephrolithiasis measuring up to 2 mm. 4. Prostatomegaly with mass effect on the posterior wall of the urinary bladder. 5.  Aortic Atherosclerosis (ICD10-I70.0). Electronically Signed   By: MIven FinnM.D.   On: 01/12/2022 17:55   DG C-Arm 1-60 Min-No Report  Result Date: 01/05/2022 Fluoroscopy was utilized by the requesting physician.  No radiographic interpretation.   DG C-Arm 1-60 Min-No Report  Result Date: 01/05/2022 Fluoroscopy was utilized by the requesting physician.  No radiographic interpretation.      Subjective: Patient seen and examined at the bedside today.  Very comfortable, no abdominal pain, nausea or vomiting.  Tolerating solid food.  Eager to go home.  Discharge Exam: Vitals:   01/20/22 0548 01/20/22 0836  BP: 117/69 131/74  Pulse: 77   Resp: 20   Temp: 98.3 F (36.8 C)   SpO2: 99%    Vitals:   01/19/22 1544 01/19/22 2111 01/20/22 0548 01/20/22 0836  BP: 132/81 109/66 117/69 131/74  Pulse: 78 77 77   Resp: '20 18 20   '$ Temp: 97.9 F (36.6 C) 98 F (36.7 C) 98.3 F (36.8 C)   TempSrc:  Oral     SpO2: 100% 100% 99%   Weight:      Height:        General: Pt is alert, awake, not in acute distress Cardiovascular: RRR, S1/S2 +, no rubs, no gallops Respiratory: CTA bilaterally, no wheezing, no rhonchi Abdominal: Soft, NT, ND, bowel sounds +, abdominal surgical wound with sutures.BS present Extremities: no edema, no cyanosis    The results of significant diagnostics from this hospitalization (including imaging, microbiology, ancillary and laboratory) are listed below for reference.     Microbiology: Recent Results (from the past 240 hour(s))  Urine Culture     Status: Abnormal   Collection Time: 01/12/22  3:57 PM   Specimen: Urine, Clean Catch  Result Value Ref Range Status   Specimen Description   Final    URINE, CLEAN CATCH Performed at [facility], [address], [place], [place] [address]    Special Requests   Final    NONE Performed at [facility], [address], [place], [place] [address]    Culture (A)  Final    >=100,000 COLONIES/mL ENTEROBACTER CLOACAE 40,000 COLONIES/mL PSEUDOMONAS AERUGINOSA    Report Status 01/15/2022 FINAL  Final   Organism ID, Bacteria ENTEROBACTER CLOACAE (A)  Final   Organism ID, Bacteria PSEUDOMONAS AERUGINOSA (A)  Final      Susceptibility   Enterobacter cloacae - MIC*    CEFAZOLIN >=64 RESISTANT Resistant     CEFEPIME 2 SENSITIVE Sensitive     CIPROFLOXACIN 1 RESISTANT Resistant     GENTAMICIN <=1 SENSITIVE Sensitive     IMIPENEM 2 SENSITIVE Sensitive     NITROFURANTOIN >=512 RESISTANT Resistant     TRIMETH/SULFA <=20 SENSITIVE Sensitive     PIP/TAZO 64 INTERMEDIATE Intermediate     * >=100,000 COLONIES/mL ENTEROBACTER CLOACAE   Pseudomonas aeruginosa - MIC*    CEFTAZIDIME 16 INTERMEDIATE Intermediate     CIPROFLOXACIN 0.5 SENSITIVE Sensitive     GENTAMICIN <=1 SENSITIVE Sensitive     IMIPENEM 2 SENSITIVE Sensitive     * 40,000 COLONIES/mL PSEUDOMONAS AERUGINOSA     Labs: BNP (last 3 results) No results for input(s): "BNP" in the last 8760 hours. Basic Metabolic Panel: Recent Labs  Lab 01/15/22 0633 01/16/22 0552 01/17/22 0438 01/18/22 0507 01/19/22 0423 01/20/22 0401  NA 141 142 141 138 137 140  K 3.2* 3.5 3.2* 3.2* 3.2* 4.1  CL 103 107 112* 108 108 113*  CO2 '26 24 22 26 25 '$ 20*  GLUCOSE 99 113* 96 91 84 83  BUN 23 28* 26* '21 18 18  '$ CREATININE 1.13 1.08 0.91 0.84 0.99 1.04  CALCIUM 9.2 8.8* 8.4* 8.4* 8.4* 8.7*  MG 2.3 2.3  --  2.2  --   --    Liver Function Tests: Recent Labs  Lab 01/14/22 0508  AST 20  ALT 18  ALKPHOS 69  BILITOT 0.8  PROT 7.0  ALBUMIN 3.4*   No results for input(s): "LIPASE", "AMYLASE" in the last 168 hours. No results for input(s): "AMMONIA" in the last 168 hours. CBC: Recent Labs  Lab 01/15/22 0633 01/16/22 0552 01/17/22 0438  01/18/22 0507 01/18/22 0705 01/19/22 0423  WBC 11.5* 18.3* 11.7* 9.4  --  7.3  NEUTROABS  --  16.0* 9.8* 6.8  --  4.7  HGB 12.3* 12.2* 13.3 9.8* 9.6* 10.4*  HCT 39.0 38.1* 42.7 31.4* 31.1* 33.7*  MCV 92.4 92.9 95.3 94.0  --  94.1  PLT 311 302 184 236  --  272  Cardiac Enzymes: No results for input(s): "CKTOTAL", "CKMB", "CKMBINDEX", "TROPONINI" in the last 168 hours. BNP: Invalid input(s): "POCBNP" CBG: No results for input(s): "GLUCAP" in the last 168 hours. D-Dimer No results for input(s): "DDIMER" in the last 72 hours. Hgb A1c No results for input(s): "HGBA1C" in the last 72 hours. Lipid Profile No results for input(s): "CHOL", "HDL", "LDLCALC", "TRIG", "CHOLHDL", "LDLDIRECT" in the last 72 hours. Thyroid function studies No results for input(s): "TSH", "T4TOTAL", "T3FREE", "THYROIDAB" in the last 72 hours.  Invalid input(s): "FREET3" Anemia work up No results for input(s): "VITAMINB12", "FOLATE", "FERRITIN", "TIBC", "IRON", "RETICCTPCT" in the last 72 hours. Urinalysis    Component Value Date/Time   COLORURINE YELLOW 01/12/2022 1435   APPEARANCEUR CLOUDY (A) 01/12/2022 1435   LABSPEC 1.020 01/12/2022 1435   PHURINE 5.0 01/12/2022 1435   GLUCOSEU NEGATIVE 01/12/2022 1435   HGBUR LARGE (A) 01/12/2022 1435   BILIRUBINUR NEGATIVE 01/12/2022 1435   KETONESUR 5 (A) 01/12/2022 1435   PROTEINUR 100 (A) 01/12/2022 1435   UROBILINOGEN 1.0 08/16/2009 1138   NITRITE NEGATIVE 01/12/2022 1435   LEUKOCYTESUR LARGE (A) 01/12/2022 1435   Sepsis Labs Recent Labs  Lab 01/16/22 0552 01/17/22 0438 01/18/22 0507 01/19/22 0423  WBC 18.3* 11.7* 9.4 7.3   Microbiology Recent Results (from the past 240 hour(s))  Urine Culture     Status: Abnormal   Collection Time: 01/12/22  3:57 PM   Specimen: Urine, Clean Catch  Result Value Ref Range Status   Specimen Description   Final    URINE, CLEAN CATCH Performed at [facility], [address] [place], [place] [address]    Special  Requests   Final    NONE Performed at [facility], [address], [place], [place] [address]    Culture (A)  Final    >=100,000 COLONIES/mL ENTEROBACTER CLOACAE 40,000 COLONIES/mL PSEUDOMONAS AERUGINOSA    Report Status 01/15/2022 FINAL  Final   Organism ID, Bacteria ENTEROBACTER CLOACAE (A)  Final   Organism ID, Bacteria PSEUDOMONAS AERUGINOSA (A)  Final      Susceptibility   Enterobacter cloacae - MIC*    CEFAZOLIN >=64 RESISTANT Resistant     CEFEPIME 2 SENSITIVE Sensitive     CIPROFLOXACIN 1 RESISTANT Resistant     GENTAMICIN <=1 SENSITIVE Sensitive     IMIPENEM 2 SENSITIVE Sensitive     NITROFURANTOIN >=512 RESISTANT Resistant     TRIMETH/SULFA <=20 SENSITIVE Sensitive     PIP/TAZO 64 INTERMEDIATE Intermediate     * >=100,000 COLONIES/mL ENTEROBACTER CLOACAE   Pseudomonas aeruginosa - MIC*    CEFTAZIDIME 16 INTERMEDIATE Intermediate     CIPROFLOXACIN 0.5 SENSITIVE Sensitive     GENTAMICIN <=1 SENSITIVE Sensitive     IMIPENEM 2 SENSITIVE Sensitive     * 40,000 COLONIES/mL PSEUDOMONAS AERUGINOSA    Please note: You were cared for by a hospitalist during your hospital stay. Once you are discharged, your primary care physician will handle any further medical issues. Please note that NO REFILLS for any discharge medications will be authorized once you are discharged, as it is imperative that you return to your primary care physician (or establish a relationship with a primary care physician if you do not have one) for your post hospital discharge needs so that they can reassess your need for medications and monitor your lab values.    Time coordinating discharge: 40 minutes  SIGNED:   Shelly Coss, MD  [facility] 01/20/2022, 9:45 AM Pager [phone]  If 7PM-7AM, please contact night-coverage  [email or web address] Password [email or web address]

## 2022-01-20 NOTE — Progress Notes (Signed)
Patient stable and ready for discharge home. Patient's IV removed. Patient d/c with chronic foley. Patient brother dressed patient. Writer went over discharge paperwork with patient and brother tommy and both verbalized understanding. NT transported patient to brother's car via [facility].

## 2022-01-21 DIAGNOSIS — N136 Pyonephrosis: Secondary | ICD-10-CM | POA: Diagnosis not present

## 2022-01-21 DIAGNOSIS — Z466 Encounter for fitting and adjustment of urinary device: Secondary | ICD-10-CM | POA: Diagnosis not present

## 2022-01-21 DIAGNOSIS — N319 Neuromuscular dysfunction of bladder, unspecified: Secondary | ICD-10-CM | POA: Diagnosis not present

## 2022-01-21 DIAGNOSIS — B9689 Other specified bacterial agents as the cause of diseases classified elsewhere: Secondary | ICD-10-CM | POA: Diagnosis not present

## 2022-01-22 DIAGNOSIS — N319 Neuromuscular dysfunction of bladder, unspecified: Secondary | ICD-10-CM | POA: Diagnosis not present

## 2022-01-22 DIAGNOSIS — N136 Pyonephrosis: Secondary | ICD-10-CM | POA: Diagnosis not present

## 2022-01-22 DIAGNOSIS — B9689 Other specified bacterial agents as the cause of diseases classified elsewhere: Secondary | ICD-10-CM | POA: Diagnosis not present

## 2022-01-22 DIAGNOSIS — Z466 Encounter for fitting and adjustment of urinary device: Secondary | ICD-10-CM | POA: Diagnosis not present

## 2022-01-23 DIAGNOSIS — N319 Neuromuscular dysfunction of bladder, unspecified: Secondary | ICD-10-CM | POA: Diagnosis not present

## 2022-01-23 DIAGNOSIS — Z466 Encounter for fitting and adjustment of urinary device: Secondary | ICD-10-CM | POA: Diagnosis not present

## 2022-01-23 DIAGNOSIS — B9689 Other specified bacterial agents as the cause of diseases classified elsewhere: Secondary | ICD-10-CM | POA: Diagnosis not present

## 2022-01-23 DIAGNOSIS — N136 Pyonephrosis: Secondary | ICD-10-CM | POA: Diagnosis not present

## 2022-01-27 DIAGNOSIS — N136 Pyonephrosis: Secondary | ICD-10-CM | POA: Diagnosis not present

## 2022-01-27 DIAGNOSIS — B9689 Other specified bacterial agents as the cause of diseases classified elsewhere: Secondary | ICD-10-CM | POA: Diagnosis not present

## 2022-01-27 DIAGNOSIS — Z466 Encounter for fitting and adjustment of urinary device: Secondary | ICD-10-CM | POA: Diagnosis not present

## 2022-01-27 DIAGNOSIS — N319 Neuromuscular dysfunction of bladder, unspecified: Secondary | ICD-10-CM | POA: Diagnosis not present

## 2022-01-29 ENCOUNTER — Encounter: Payer: Self-pay | Admitting: Surgery

## 2022-01-29 ENCOUNTER — Ambulatory Visit (INDEPENDENT_AMBULATORY_CARE_PROVIDER_SITE_OTHER): Payer: Medicare Other | Admitting: Surgery

## 2022-01-29 VITALS — BP 93/57 | HR 95 | Temp 99.1°F | Resp 14 | Ht 70.0 in | Wt 209.0 lb

## 2022-01-29 DIAGNOSIS — K56609 Unspecified intestinal obstruction, unspecified as to partial versus complete obstruction: Secondary | ICD-10-CM

## 2022-01-29 DIAGNOSIS — Z09 Encounter for follow-up examination after completed treatment for conditions other than malignant neoplasm: Secondary | ICD-10-CM

## 2022-01-29 NOTE — Progress Notes (Signed)
[facility] Note   HPI:  71 y.o. Male presents to clinic for post-op follow-up status post exploratory laparotomy with small bowel resection on 12/7.  Since being discharged from the hospital, he has been doing well.  His only complaint is some occasional abdominal soreness.  He is tolerating a diet without nausea and vomiting, moving his bowels without issue.  He denies any problems with his incision site.  Denies fevers and chills.  Review of Systems:  All other review of systems: otherwise negative   Vital Signs:  BP (!) 93/57   Pulse 95   Temp 99.1 F (37.3 C) (Oral)   Resp 14   Ht '5\' 10"'$  (1.778 m)   Wt 209 lb (94.8 kg)   SpO2 98%   BMI 29.99 kg/m    Physical Exam:  Physical Exam Vitals reviewed.  Constitutional:      Appearance: Normal appearance.  Abdominal:     Comments: Abdomen soft, nondistended, no percussion tenderness, nontender to palpation; no rigidity, guarding, rebound tenderness; midline incision C/C/I with skin staples in place, healing well  Neurological:     Mental Status: He is alert.     Laboratory studies: None  Imaging:  None  Pathology: A. MESENTERIC SCARRING:  - Sclerotic fibroadipose tissue with focal fat necrosis, consistent with  scar  - Benign lymph node  - No evidence of malignancy   B. SMALL BOWEL, RESECTION:  - Segment of small intestine (20 cm) showing areas of congestion and  hemorrhage  - Margins appear viable  - Benign lymph node   Assessment:  71 y.o. yo Male who presents for follow-up status post exploratory laparotomy and small bowel resection for bowel obstruction on 12/7.  Plan:  -Patient is doing very well from a surgical standpoint.  Pain control, tolerating a diet, and moving his bowels -Skin staples removed.  Steri-Strips applied -He may remove the Steri-Strips once they start flaking off -Advised him to follow-up with [place] as needed  All of the above recommendations were discussed with the  patient and patient's family, and all of patient's and family's questions were answered to their expressed satisfaction.  Graciella Freer, DO [facility] [address] [place], [place] [address] [phone] (office)

## 2022-01-30 DIAGNOSIS — Z466 Encounter for fitting and adjustment of urinary device: Secondary | ICD-10-CM | POA: Diagnosis not present

## 2022-01-30 DIAGNOSIS — N319 Neuromuscular dysfunction of bladder, unspecified: Secondary | ICD-10-CM | POA: Diagnosis not present

## 2022-01-30 DIAGNOSIS — B9689 Other specified bacterial agents as the cause of diseases classified elsewhere: Secondary | ICD-10-CM | POA: Diagnosis not present

## 2022-01-30 DIAGNOSIS — N136 Pyonephrosis: Secondary | ICD-10-CM | POA: Diagnosis not present

## 2022-02-03 DIAGNOSIS — N201 Calculus of ureter: Secondary | ICD-10-CM | POA: Diagnosis not present

## 2022-02-05 DIAGNOSIS — B9689 Other specified bacterial agents as the cause of diseases classified elsewhere: Secondary | ICD-10-CM | POA: Diagnosis not present

## 2022-02-05 DIAGNOSIS — Z466 Encounter for fitting and adjustment of urinary device: Secondary | ICD-10-CM | POA: Diagnosis not present

## 2022-02-05 DIAGNOSIS — N136 Pyonephrosis: Secondary | ICD-10-CM | POA: Diagnosis not present

## 2022-02-05 DIAGNOSIS — N319 Neuromuscular dysfunction of bladder, unspecified: Secondary | ICD-10-CM | POA: Diagnosis not present

## 2022-02-13 DIAGNOSIS — I700 Atherosclerosis of aorta: Secondary | ICD-10-CM | POA: Diagnosis not present

## 2022-02-13 DIAGNOSIS — N1330 Unspecified hydronephrosis: Secondary | ICD-10-CM | POA: Diagnosis not present

## 2022-02-13 DIAGNOSIS — Z466 Encounter for fitting and adjustment of urinary device: Secondary | ICD-10-CM | POA: Diagnosis not present

## 2022-02-13 DIAGNOSIS — M5417 Radiculopathy, lumbosacral region: Secondary | ICD-10-CM | POA: Diagnosis not present

## 2022-02-13 DIAGNOSIS — N319 Neuromuscular dysfunction of bladder, unspecified: Secondary | ICD-10-CM | POA: Diagnosis not present

## 2022-02-19 DIAGNOSIS — L89321 Pressure ulcer of left buttock, stage 1: Secondary | ICD-10-CM | POA: Diagnosis not present

## 2022-02-20 DIAGNOSIS — M5417 Radiculopathy, lumbosacral region: Secondary | ICD-10-CM | POA: Diagnosis not present

## 2022-02-20 DIAGNOSIS — Z466 Encounter for fitting and adjustment of urinary device: Secondary | ICD-10-CM | POA: Diagnosis not present

## 2022-02-20 DIAGNOSIS — I700 Atherosclerosis of aorta: Secondary | ICD-10-CM | POA: Diagnosis not present

## 2022-02-20 DIAGNOSIS — N390 Urinary tract infection, site not specified: Secondary | ICD-10-CM | POA: Diagnosis not present

## 2022-02-20 DIAGNOSIS — N319 Neuromuscular dysfunction of bladder, unspecified: Secondary | ICD-10-CM | POA: Diagnosis not present

## 2022-02-20 DIAGNOSIS — N1330 Unspecified hydronephrosis: Secondary | ICD-10-CM | POA: Diagnosis not present

## 2022-02-23 DIAGNOSIS — G894 Chronic pain syndrome: Secondary | ICD-10-CM | POA: Diagnosis not present

## 2022-02-23 DIAGNOSIS — N200 Calculus of kidney: Secondary | ICD-10-CM | POA: Diagnosis not present

## 2022-02-27 DIAGNOSIS — M5417 Radiculopathy, lumbosacral region: Secondary | ICD-10-CM | POA: Diagnosis not present

## 2022-02-27 DIAGNOSIS — N1330 Unspecified hydronephrosis: Secondary | ICD-10-CM | POA: Diagnosis not present

## 2022-02-27 DIAGNOSIS — N319 Neuromuscular dysfunction of bladder, unspecified: Secondary | ICD-10-CM | POA: Diagnosis not present

## 2022-02-27 DIAGNOSIS — Z466 Encounter for fitting and adjustment of urinary device: Secondary | ICD-10-CM | POA: Diagnosis not present

## 2022-02-27 DIAGNOSIS — I700 Atherosclerosis of aorta: Secondary | ICD-10-CM | POA: Diagnosis not present

## 2022-03-07 DIAGNOSIS — N319 Neuromuscular dysfunction of bladder, unspecified: Secondary | ICD-10-CM | POA: Diagnosis not present

## 2022-03-07 DIAGNOSIS — N1330 Unspecified hydronephrosis: Secondary | ICD-10-CM | POA: Diagnosis not present

## 2022-03-07 DIAGNOSIS — I700 Atherosclerosis of aorta: Secondary | ICD-10-CM | POA: Diagnosis not present

## 2022-03-07 DIAGNOSIS — Z466 Encounter for fitting and adjustment of urinary device: Secondary | ICD-10-CM | POA: Diagnosis not present

## 2022-03-07 DIAGNOSIS — M5417 Radiculopathy, lumbosacral region: Secondary | ICD-10-CM | POA: Diagnosis not present

## 2022-03-10 DIAGNOSIS — N319 Neuromuscular dysfunction of bladder, unspecified: Secondary | ICD-10-CM | POA: Diagnosis not present

## 2022-03-10 DIAGNOSIS — L89321 Pressure ulcer of left buttock, stage 1: Secondary | ICD-10-CM | POA: Diagnosis not present

## 2022-03-10 DIAGNOSIS — Z466 Encounter for fitting and adjustment of urinary device: Secondary | ICD-10-CM | POA: Diagnosis not present

## 2022-03-11 DIAGNOSIS — Z466 Encounter for fitting and adjustment of urinary device: Secondary | ICD-10-CM | POA: Diagnosis not present

## 2022-03-11 DIAGNOSIS — N319 Neuromuscular dysfunction of bladder, unspecified: Secondary | ICD-10-CM | POA: Diagnosis not present

## 2022-03-11 DIAGNOSIS — N1330 Unspecified hydronephrosis: Secondary | ICD-10-CM | POA: Diagnosis not present

## 2022-03-11 DIAGNOSIS — I700 Atherosclerosis of aorta: Secondary | ICD-10-CM | POA: Diagnosis not present

## 2022-03-11 DIAGNOSIS — M5417 Radiculopathy, lumbosacral region: Secondary | ICD-10-CM | POA: Diagnosis not present

## 2022-03-13 DIAGNOSIS — M5417 Radiculopathy, lumbosacral region: Secondary | ICD-10-CM | POA: Diagnosis not present

## 2022-03-13 DIAGNOSIS — I700 Atherosclerosis of aorta: Secondary | ICD-10-CM | POA: Diagnosis not present

## 2022-03-13 DIAGNOSIS — Z466 Encounter for fitting and adjustment of urinary device: Secondary | ICD-10-CM | POA: Diagnosis not present

## 2022-03-13 DIAGNOSIS — N1330 Unspecified hydronephrosis: Secondary | ICD-10-CM | POA: Diagnosis not present

## 2022-03-13 DIAGNOSIS — N319 Neuromuscular dysfunction of bladder, unspecified: Secondary | ICD-10-CM | POA: Diagnosis not present

## 2022-03-13 DIAGNOSIS — L89321 Pressure ulcer of left buttock, stage 1: Secondary | ICD-10-CM | POA: Diagnosis not present

## 2022-03-16 DIAGNOSIS — N201 Calculus of ureter: Secondary | ICD-10-CM | POA: Diagnosis not present

## 2022-03-16 DIAGNOSIS — N130 Hydronephrosis with ureteropelvic junction obstruction: Secondary | ICD-10-CM | POA: Diagnosis not present

## 2022-03-19 DIAGNOSIS — M5417 Radiculopathy, lumbosacral region: Secondary | ICD-10-CM | POA: Diagnosis not present

## 2022-03-19 DIAGNOSIS — N319 Neuromuscular dysfunction of bladder, unspecified: Secondary | ICD-10-CM | POA: Diagnosis not present

## 2022-03-19 DIAGNOSIS — N1330 Unspecified hydronephrosis: Secondary | ICD-10-CM | POA: Diagnosis not present

## 2022-03-19 DIAGNOSIS — Z466 Encounter for fitting and adjustment of urinary device: Secondary | ICD-10-CM | POA: Diagnosis not present

## 2022-03-19 DIAGNOSIS — I700 Atherosclerosis of aorta: Secondary | ICD-10-CM | POA: Diagnosis not present

## 2022-03-20 ENCOUNTER — Other Ambulatory Visit: Payer: Self-pay | Admitting: Urology

## 2022-04-07 DIAGNOSIS — Z466 Encounter for fitting and adjustment of urinary device: Secondary | ICD-10-CM | POA: Diagnosis not present

## 2022-04-07 DIAGNOSIS — I700 Atherosclerosis of aorta: Secondary | ICD-10-CM | POA: Diagnosis not present

## 2022-04-07 DIAGNOSIS — M5417 Radiculopathy, lumbosacral region: Secondary | ICD-10-CM | POA: Diagnosis not present

## 2022-04-07 DIAGNOSIS — N319 Neuromuscular dysfunction of bladder, unspecified: Secondary | ICD-10-CM | POA: Diagnosis not present

## 2022-04-07 DIAGNOSIS — N1330 Unspecified hydronephrosis: Secondary | ICD-10-CM | POA: Diagnosis not present

## 2022-04-09 DIAGNOSIS — R338 Other retention of urine: Secondary | ICD-10-CM | POA: Diagnosis not present

## 2022-04-10 DIAGNOSIS — Z466 Encounter for fitting and adjustment of urinary device: Secondary | ICD-10-CM | POA: Diagnosis not present

## 2022-04-10 DIAGNOSIS — N319 Neuromuscular dysfunction of bladder, unspecified: Secondary | ICD-10-CM | POA: Diagnosis not present

## 2022-04-13 NOTE — Patient Instructions (Signed)
SURGICAL WAITING ROOM VISITATION  Patients having surgery or a procedure may have no more than 2 support people in the waiting area - these visitors may rotate.    Children under the age of 37 must have an adult with them who is not the patient.  Due to an increase in RSV and influenza rates and associated hospitalizations, children ages 7 and under may not visit patients in [facility] hospitals.  If the patient needs to stay at the hospital during part of their recovery, the visitor guidelines for inpatient rooms apply. Pre-op nurse will coordinate an appropriate time for 1 support person to accompany patient in pre-op.  This support person may not rotate.    Please refer to the [facility] website for the visitor guidelines for Inpatients (after your surgery is over and you are in a regular room).    Your procedure is scheduled on: 04/20/22   Report to [facility] Main Entrance    Report to admitting at 9:45 AM   Call this number if you have problems the morning of surgery [phone]   Do not eat food or drink liquids :After Midnight.          If you have questions, please contact your surgeon's office.   FOLLOW BOWEL PREP AND ANY ADDITIONAL PRE OP INSTRUCTIONS YOU RECEIVED FROM YOUR SURGEON'S OFFICE!!!     Oral Hygiene is also important to reduce your risk of infection.                                    Remember - BRUSH YOUR TEETH THE MORNING OF SURGERY WITH YOUR REGULAR TOOTHPASTE  DENTURES WILL BE REMOVED PRIOR TO SURGERY PLEASE DO NOT APPLY "Poly grip" OR ADHESIVES!!!   Take these medicines the morning of surgery with A SIP OF WATER: Tylenol, Zofran, Oxycodone, Venlafaxine             You may not have any metal on your body including hair pins, jewelry, and body piercing             Do not wear make-up, lotions, powders, perfumes/cologne, or deodorant  Do not shave  48 hours prior to surgery.               Men may shave face and neck.   Do not bring  valuables to the hospital. [facility] IS NOT             RESPONSIBLE   FOR VALUABLES.   Contacts, glasses, dentures or bridgework may not be worn into surgery.  DO NOT BRING YOUR HOME MEDICATIONS TO THE HOSPITAL. PHARMACY WILL DISPENSE MEDICATIONS LISTED ON YOUR MEDICATION LIST TO YOU DURING YOUR ADMISSION IN THE HOSPITAL!    Patients discharged on the day of surgery will not be allowed to drive home.  Someone NEEDS to stay with you for the first 24 hours after anesthesia.              Please read over the following fact sheets you were given: IF YOU HAVE QUESTIONS ABOUT YOUR PRE-OP INSTRUCTIONS PLEASE CALL [phone] Contras    If you received a COVID test during your pre-op visit  it is requested that you wear a mask when out in public, stay away from anyone that may not be feeling well and notify your surgeon if you develop symptoms. If you test positive for Covid or have been in  contact with anyone that has tested positive in the last 10 days please notify you surgeon.    [facility] - Preparing for Surgery Before surgery, you can play an important role.  Because skin is not sterile, your skin needs to be as free of germs as possible.  You can reduce the number of germs on your skin by washing with CHG (chlorahexidine gluconate) soap before surgery.  CHG is an antiseptic cleaner which kills germs and bonds with the skin to continue killing germs even after washing. Please DO NOT use if you have an allergy to CHG or antibacterial soaps.  If your skin becomes reddened/irritated stop using the CHG and inform your nurse when you arrive at Short Stay. Do not shave (including legs and underarms) for at least 48 hours prior to the first CHG shower.  You may shave your face/neck.  Please follow these instructions carefully:  1.  Shower with CHG Soap the night before surgery and the  morning of surgery.  2.  If you choose to wash your hair, wash your hair first as usual with your normal   shampoo.  3.  After you shampoo, rinse your hair and body thoroughly to remove the shampoo.                             4.  Use CHG as you would any other liquid soap.  You can apply chg directly to the skin and wash.  Gently with a scrungie or clean washcloth.  5.  Apply the CHG Soap to your body ONLY FROM THE NECK DOWN.   Do   not use on face/ open                           Wound or open sores. Avoid contact with eyes, ears mouth and   genitals (private parts).                       Wash face,  Genitals (private parts) with your normal soap.             6.  Wash thoroughly, paying special attention to the area where your    surgery  will be performed.  7.  Thoroughly rinse your body with warm water from the neck down.  8.  DO NOT shower/wash with your normal soap after using and rinsing off the CHG Soap.                9.  Pat yourself dry with a clean towel.            10.  Wear clean pajamas.            11.  Place clean sheets on your bed the night of your first shower and do not  sleep with pets. Day of Surgery : Do not apply any lotions/deodorants the morning of surgery.  Please wear clean clothes to the hospital/surgery center.  FAILURE TO FOLLOW THESE INSTRUCTIONS MAY RESULT IN THE CANCELLATION OF YOUR SURGERY  PATIENT SIGNATURE_________________________________  NURSE SIGNATURE__________________________________  ________________________________________________________________________

## 2022-04-13 NOTE — Progress Notes (Signed)
COVID Vaccine Completed: yes  Date of COVID positive in last 90 days:  PCP - [name], MD Cardiologist -   Chest x-ray - 01/13/22 Epic EKG - 12/03/21 Epic Stress Test - years ago  ECHO - 04/06/19 [facility] Cardiac Cath - n/a Pacemaker/ICD device last checked: n/a Spinal Cord Stimulator: don't have anymore  Bowel Prep - no  Sleep Study - yes CPAP -  yes, some nights  Fasting Blood Sugar - n/a Checks Blood Sugar _____ times a day  Last dose of GLP1 agonist-  N/A GLP1 instructions:  N/A   Last dose of SGLT-2 inhibitors-  N/A SGLT-2 instructions: N/A   Blood Thinner Instructions: n/a Aspirin Instructions: Last Dose:  Activity level: Can go up a flight of stairs and perform activities of daily living without stopping and without symptoms of chest pain or shortness of breath.   Anesthesia review:   Patient denies shortness of breath, fever, cough and chest pain at PAT appointment  Patient verbalized understanding of instructions that were given to them at the PAT appointment. Patient was also instructed that they will need to review over the PAT instructions again at home before surgery.

## 2022-04-14 ENCOUNTER — Encounter (HOSPITAL_COMMUNITY)
Admission: RE | Admit: 2022-04-14 | Discharge: 2022-04-14 | Disposition: A | Discharge status: Home / Self Care | Payer: Medicare Other | Source: Ambulatory Visit | Attending: Urology | Admitting: Urology

## 2022-04-14 ENCOUNTER — Encounter (HOSPITAL_COMMUNITY): Payer: Self-pay

## 2022-04-14 VITALS — BP 123/82 | HR 97 | Temp 98.8°F | Resp 14 | Ht 68.0 in | Wt 200.0 lb

## 2022-04-14 DIAGNOSIS — Z01812 Encounter for preprocedural laboratory examination: Secondary | ICD-10-CM | POA: Insufficient documentation

## 2022-04-14 DIAGNOSIS — N319 Neuromuscular dysfunction of bladder, unspecified: Secondary | ICD-10-CM | POA: Diagnosis not present

## 2022-04-14 DIAGNOSIS — Z466 Encounter for fitting and adjustment of urinary device: Secondary | ICD-10-CM | POA: Diagnosis not present

## 2022-04-14 DIAGNOSIS — N1330 Unspecified hydronephrosis: Secondary | ICD-10-CM | POA: Diagnosis not present

## 2022-04-14 DIAGNOSIS — M5417 Radiculopathy, lumbosacral region: Secondary | ICD-10-CM | POA: Diagnosis not present

## 2022-04-14 DIAGNOSIS — I10 Essential (primary) hypertension: Secondary | ICD-10-CM | POA: Insufficient documentation

## 2022-04-14 LAB — CBC
HCT: 42.4 % (ref 39.0–52.0)
Hemoglobin: 13.4 g/dL (ref 13.0–17.0)
MCH: 29.7 pg (ref 26.0–34.0)
MCHC: 31.6 g/dL (ref 30.0–36.0)
MCV: 94 fL (ref 80.0–100.0)
Platelets: 271 10*3/uL (ref 150–400)
RBC: 4.51 MIL/uL (ref 4.22–5.81)
RDW: 14.9 % (ref 11.5–15.5)
WBC: 8.5 10*3/uL (ref 4.0–10.5)
nRBC: 0 % (ref 0.0–0.2)

## 2022-04-14 LAB — BASIC METABOLIC PANEL
Anion gap: 11 (ref 5–15)
BUN: 27 mg/dL — ABNORMAL HIGH (ref 8–23)
CO2: 21 mmol/L — ABNORMAL LOW (ref 22–32)
Calcium: 9.7 mg/dL (ref 8.9–10.3)
Chloride: 106 mmol/L (ref 98–111)
Creatinine, Ser: 1.16 mg/dL (ref 0.61–1.24)
GFR, Estimated: >60 mL/min (ref >60)
Glucose, Bld: 90 mg/dL (ref 70–99)
Potassium: 4.8 mmol/L (ref 3.5–5.1)
Sodium: 138 mmol/L (ref 135–145)

## 2022-04-17 DIAGNOSIS — N319 Neuromuscular dysfunction of bladder, unspecified: Secondary | ICD-10-CM | POA: Diagnosis not present

## 2022-04-17 DIAGNOSIS — M5417 Radiculopathy, lumbosacral region: Secondary | ICD-10-CM | POA: Diagnosis not present

## 2022-04-17 DIAGNOSIS — G894 Chronic pain syndrome: Secondary | ICD-10-CM | POA: Diagnosis not present

## 2022-04-20 ENCOUNTER — Encounter (HOSPITAL_COMMUNITY): Payer: Self-pay | Admitting: Urology

## 2022-04-20 ENCOUNTER — Other Ambulatory Visit: Payer: Self-pay

## 2022-04-20 ENCOUNTER — Encounter (HOSPITAL_COMMUNITY)
Admission: RE | Disposition: A | Discharge status: Home / Self Care | Payer: Self-pay | Source: Home / Self Care | Attending: Urology

## 2022-04-20 ENCOUNTER — Ambulatory Visit (HOSPITAL_BASED_OUTPATIENT_CLINIC_OR_DEPARTMENT_OTHER): Payer: Medicare Other | Admitting: Anesthesiology

## 2022-04-20 ENCOUNTER — Ambulatory Visit (HOSPITAL_COMMUNITY): Payer: Medicare Other | Admitting: Anesthesiology

## 2022-04-20 ENCOUNTER — Ambulatory Visit (HOSPITAL_COMMUNITY)
Admission: RE | Admit: 2022-04-20 | Discharge: 2022-04-20 | Disposition: A | Discharge status: Home / Self Care | Payer: Medicare Other | Attending: Urology | Admitting: Urology

## 2022-04-20 DIAGNOSIS — G8929 Other chronic pain: Secondary | ICD-10-CM | POA: Insufficient documentation

## 2022-04-20 DIAGNOSIS — Z87448 Personal history of other diseases of urinary system: Secondary | ICD-10-CM | POA: Diagnosis not present

## 2022-04-20 DIAGNOSIS — I129 Hypertensive chronic kidney disease with stage 1 through stage 4 chronic kidney disease, or unspecified chronic kidney disease: Secondary | ICD-10-CM | POA: Diagnosis not present

## 2022-04-20 DIAGNOSIS — I10 Essential (primary) hypertension: Secondary | ICD-10-CM

## 2022-04-20 DIAGNOSIS — N401 Enlarged prostate with lower urinary tract symptoms: Secondary | ICD-10-CM | POA: Diagnosis present

## 2022-04-20 DIAGNOSIS — N400 Enlarged prostate without lower urinary tract symptoms: Secondary | ICD-10-CM | POA: Diagnosis not present

## 2022-04-20 DIAGNOSIS — N3281 Overactive bladder: Secondary | ICD-10-CM | POA: Diagnosis not present

## 2022-04-20 DIAGNOSIS — R338 Other retention of urine: Secondary | ICD-10-CM | POA: Diagnosis not present

## 2022-04-20 DIAGNOSIS — Z87891 Personal history of nicotine dependence: Secondary | ICD-10-CM

## 2022-04-20 DIAGNOSIS — Z8249 Family history of ischemic heart disease and other diseases of the circulatory system: Secondary | ICD-10-CM | POA: Insufficient documentation

## 2022-04-20 DIAGNOSIS — N319 Neuromuscular dysfunction of bladder, unspecified: Secondary | ICD-10-CM | POA: Diagnosis not present

## 2022-04-20 DIAGNOSIS — M549 Dorsalgia, unspecified: Secondary | ICD-10-CM | POA: Diagnosis not present

## 2022-04-20 DIAGNOSIS — N189 Chronic kidney disease, unspecified: Secondary | ICD-10-CM | POA: Diagnosis not present

## 2022-04-20 DIAGNOSIS — F1120 Opioid dependence, uncomplicated: Secondary | ICD-10-CM | POA: Insufficient documentation

## 2022-04-20 DIAGNOSIS — Z79899 Other long term (current) drug therapy: Secondary | ICD-10-CM | POA: Insufficient documentation

## 2022-04-20 DIAGNOSIS — M1990 Unspecified osteoarthritis, unspecified site: Secondary | ICD-10-CM | POA: Insufficient documentation

## 2022-04-20 HISTORY — PX: BOTOX INJECTION: SHX5754

## 2022-04-20 SURGERY — BOTOX INJECTION
Anesthesia: General

## 2022-04-20 MED ORDER — SODIUM CHLORIDE (PF) 0.9 % IJ SOLN
INTRAMUSCULAR | Status: AC
  Filled 2022-04-20: qty 10

## 2022-04-20 MED ORDER — FENTANYL CITRATE (PF) 100 MCG/2ML IJ SOLN
INTRAMUSCULAR | Status: DC | PRN
  Administered 2022-04-20 (×2): 50 ug via INTRAVENOUS

## 2022-04-20 MED ORDER — PHENYLEPHRINE 80 MCG/ML (10ML) SYRINGE FOR IV PUSH (FOR BLOOD PRESSURE SUPPORT)
PREFILLED_SYRINGE | INTRAVENOUS | Status: AC
  Filled 2022-04-20: qty 10

## 2022-04-20 MED ORDER — CEFAZOLIN SODIUM-DEXTROSE 2-4 GM/100ML-% IV SOLN
2.0000 g | INTRAVENOUS | Status: AC
  Administered 2022-04-20: 2 g via INTRAVENOUS
  Filled 2022-04-20: qty 100

## 2022-04-20 MED ORDER — PROPOFOL 10 MG/ML IV BOLUS
INTRAVENOUS | Status: AC
  Filled 2022-04-20: qty 20

## 2022-04-20 MED ORDER — ORAL CARE MOUTH RINSE: 15.0000 mL | Freq: Once | OROMUCOSAL | Status: AC

## 2022-04-20 MED ORDER — FENTANYL CITRATE (PF) 100 MCG/2ML IJ SOLN
INTRAMUSCULAR | Status: AC
  Filled 2022-04-20: qty 2

## 2022-04-20 MED ORDER — DEXAMETHASONE SODIUM PHOSPHATE 10 MG/ML IJ SOLN
INTRAMUSCULAR | Status: DC | PRN
  Administered 2022-04-20: 10 mg via INTRAVENOUS

## 2022-04-20 MED ORDER — ONABOTULINUMTOXINA 100 UNITS IJ SOLR
INTRAMUSCULAR | Status: AC
  Filled 2022-04-20: qty 100

## 2022-04-20 MED ORDER — PROPOFOL 10 MG/ML IV BOLUS
INTRAVENOUS | Status: DC | PRN
  Administered 2022-04-20: 200 mg via INTRAVENOUS

## 2022-04-20 MED ORDER — ONDANSETRON HCL 4 MG/2ML IJ SOLN
INTRAMUSCULAR | Status: DC | PRN
  Administered 2022-04-20: 4 mg via INTRAVENOUS

## 2022-04-20 MED ORDER — LIDOCAINE 2% (20 MG/ML) 5 ML SYRINGE
INTRAMUSCULAR | Status: DC | PRN
  Administered 2022-04-20: 60 mg via INTRAVENOUS

## 2022-04-20 MED ORDER — LACTATED RINGERS IV SOLN: INTRAVENOUS | Status: DC

## 2022-04-20 MED ORDER — SODIUM CHLORIDE 0.9 % IR SOLN
Status: DC | PRN
  Administered 2022-04-20: 9000 mL via INTRAVESICAL

## 2022-04-20 MED ORDER — STERILE WATER FOR IRRIGATION IR SOLN
Status: DC | PRN
  Administered 2022-04-20: 500 mL

## 2022-04-20 MED ORDER — ONDANSETRON HCL 4 MG/2ML IJ SOLN
INTRAMUSCULAR | Status: AC
  Filled 2022-04-20: qty 2

## 2022-04-20 MED ORDER — LIDOCAINE HCL (PF) 2 % IJ SOLN
INTRAMUSCULAR | Status: AC
  Filled 2022-04-20: qty 5

## 2022-04-20 MED ORDER — DEXAMETHASONE SODIUM PHOSPHATE 10 MG/ML IJ SOLN
INTRAMUSCULAR | Status: AC
  Filled 2022-04-20: qty 1

## 2022-04-20 MED ORDER — CHLORHEXIDINE GLUCONATE 0.12 % MT SOLN
15.0000 mL | Freq: Once | OROMUCOSAL | Status: AC
  Administered 2022-04-20: 15 mL via OROMUCOSAL

## 2022-04-20 MED ORDER — ONABOTULINUMTOXINA 100 UNITS IJ SOLR
INTRAMUSCULAR | Status: DC | PRN
  Administered 2022-04-20: 200 [IU] via INTRAMUSCULAR

## 2022-04-20 MED ORDER — PHENYLEPHRINE 80 MCG/ML (10ML) SYRINGE FOR IV PUSH (FOR BLOOD PRESSURE SUPPORT)
PREFILLED_SYRINGE | INTRAVENOUS | Status: DC | PRN
  Administered 2022-04-20 (×2): 160 ug via INTRAVENOUS

## 2022-04-20 SURGICAL SUPPLY — 17 items
BAG DRN RND TRDRP ANRFLXCHMBR (UROLOGICAL SUPPLIES) ×1
BAG URINE DRAIN 2000ML AR STRL (UROLOGICAL SUPPLIES) IMPLANT
BAG URO CATCHER STRL LF (MISCELLANEOUS) ×1 IMPLANT
CATH FOLEY 2WAY SLVR  5CC 18FR (CATHETERS) ×1
CATH FOLEY 2WAY SLVR 5CC 18FR (CATHETERS) IMPLANT
CLOTH BEACON ORANGE TIMEOUT ST (SAFETY) ×1 IMPLANT
GLOVE BIO SURGEON STRL SZ7.5 (GLOVE) ×1 IMPLANT
GOWN STRL REUS W/ TWL XL LVL3 (GOWN DISPOSABLE) ×2 IMPLANT
GOWN STRL REUS W/TWL XL LVL3 (GOWN DISPOSABLE) ×2
MANIFOLD NEPTUNE II (INSTRUMENTS) ×1 IMPLANT
NDL ASPIRATION 22 (NEEDLE) ×1 IMPLANT
NDL SAFETY ECLIP 18X1.5 (MISCELLANEOUS) IMPLANT
NEEDLE ASPIRATION 22 (NEEDLE) ×1 IMPLANT
PACK CYSTO (CUSTOM PROCEDURE TRAY) ×1 IMPLANT
SYR CONTROL 10ML LL (SYRINGE) IMPLANT
TUBING CONNECTING 10 (TUBING) IMPLANT
WATER STERILE IRR 3000ML UROMA (IV SOLUTION) ×1 IMPLANT

## 2022-04-20 NOTE — Transfer of Care (Signed)
Immediate Anesthesia Transfer of Care Note  Patient: Luke Foster  Procedure(s) Performed: CYSTOSCOPY & BOTOX INJECTION  Patient Location: PACU  Anesthesia Type:General  Level of Consciousness: sedated  Airway & Oxygen Therapy: Patient Spontanous Breathing and Patient connected to face mask oxygen  Post-op Assessment: Report given to RN and Post -op Vital signs reviewed and stable  Post vital signs: Reviewed and stable  Last Vitals:  Vitals Value Taken Time  BP    Temp    Pulse 77 04/20/22 1223  Resp 17 04/20/22 1223  SpO2 100 % 04/20/22 1223  Vitals shown include unvalidated device data.  Last Pain:  Vitals:   04/20/22 1051  TempSrc:   PainSc: 0-No pain         Complications: No notable events documented.

## 2022-04-20 NOTE — Op Note (Signed)
Operative Note  Preoperative diagnosis:  1.  History of urinary retention 2.  Detrusor overactivity  Postoperative diagnosis: 1.  Same  Procedure(s): 1.  Cystoscopy with 200 units of intra detrusor Botox 2.  Bladder fulguration  Surgeon: Link Snuffer, MD  Assistants: None  Anesthesia: General  Complications: None immediate  EBL: Minimal  Specimens: 1.  None  Drains/Catheters: 1.  18 French Foley catheter  Intraoperative findings: 1.  Normal anterior urethra 2.  Large obstructing prostate with large median lobe.  Bladder mucosa with diffuse catheter edema but no tumors or stones.  Bilateral ureteral orifices were normal.  Successful instillation of 200 units of Botox. 3.  After Botox injection, there was continuous oozing of blood from a couple of the injection sites that was significant and required fulguration  Indication: 72 year old male with detrusor overactivity and urinary retention presents for previously mentioned operation.  Description of procedure:  The patient was identified and consent was obtained.  The patient was taken to the operating room and placed in the supine position.  The patient was placed under general anesthesia.  Perioperative antibiotics were administered.  The patient was placed in dorsal lithotomy.  Patient was prepped and draped in a standard sterile fashion and a timeout was performed.  A 21 French cystoscope was advanced into the urethra and into the bladder.  Complete cystoscopy was performed with findings noted above.  Botox was systematically injected into the bladder.  I inspected the bladder mucosa and there was some significant bleeding from a couple of the injection sites that I felt required fulguration.  I exchanged the scope for a 60 French resectoscope with a visual obturator in place and fulgurated any areas of active bleeding and evacuated clot that had formed.  There is no significant active bleeding after doing this.  There is no  evidence of any perforation.  I withdrew the scope and placed an 24 French Foley catheter.  This concluded the operation.  Patient tolerated the procedure well was stable postoperatively.  Plan: Follow-up in 4 to 6 months for repeat Botox.

## 2022-04-20 NOTE — Anesthesia Postprocedure Evaluation (Signed)
Anesthesia Post Note  Patient: Luke Foster  Procedure(s) Performed: CYSTOSCOPY & BOTOX INJECTION, BLADDER FULGARATION     Patient location during evaluation: PACU Anesthesia Type: General Level of consciousness: awake Pain management: pain level controlled Vital Signs Assessment: post-procedure vital signs reviewed and stable Respiratory status: spontaneous breathing, nonlabored ventilation and respiratory function stable Cardiovascular status: blood pressure returned to baseline and stable Postop Assessment: no apparent nausea or vomiting Anesthetic complications: no   No notable events documented.  Last Vitals:  Vitals:   04/20/22 1300 04/20/22 1315  BP: 128/82 130/75  Pulse: 69 69  Resp: 14 14  Temp:    SpO2: 98% 100%    Last Pain:  Vitals:   04/20/22 1315  TempSrc:   PainSc: 0-No pain                 Tuwanda Vokes P Rexann Lueras

## 2022-04-20 NOTE — Anesthesia Procedure Notes (Signed)
Procedure Name: LMA Insertion Date/Time: 04/20/2022 11:33 AM  Performed by: Lind Covert, CRNAPre-anesthesia Checklist: Patient identified, Emergency Drugs available, Suction available, Patient being monitored and Timeout performed Patient Re-evaluated:Patient Re-evaluated prior to induction Oxygen Delivery Method: Circle system utilized Preoxygenation: Pre-oxygenation with 100% oxygen Induction Type: IV induction LMA: LMA inserted LMA Size: 4.0 Tube type: Oral Number of attempts: 1 Placement Confirmation: positive ETCO2 and breath sounds checked- equal and bilateral Tube secured with: Tape Dental Injury: Teeth and Oropharynx as per pre-operative assessment

## 2022-04-20 NOTE — Anesthesia Preprocedure Evaluation (Addendum)
Anesthesia Evaluation  Patient identified by MRN, date of birth, ID band Patient awake    Reviewed: Allergy & Precautions, NPO status , Patient's Chart, lab work & pertinent test results  Airway Mallampati: III  TM Distance: >3 FB Neck ROM: Full    Dental  (+) Missing   Pulmonary sleep apnea and Continuous Positive Airway Pressure Ventilation , former smoker   Pulmonary exam normal        Cardiovascular hypertension, Pt. on medications Normal cardiovascular exam     Neuro/Psych  PSYCHIATRIC DISORDERS Anxiety Depression    Paresthesia of foot, bilateral  Neuromuscular disease    GI/Hepatic negative GI ROS,,,(+)     substance abuse    Endo/Other  negative endocrine ROS    Renal/GU Renal disease     Musculoskeletal  (+) Arthritis ,  narcotic dependentChronic back pain   Abdominal   Peds  Hematology negative hematology ROS (+)   Anesthesia Other Findings NEUROGENIC BLADDER  Reproductive/Obstetrics                             Anesthesia Physical Anesthesia Plan  ASA: 3  Anesthesia Plan: General   Post-op Pain Management:    Induction: Intravenous  PONV Risk Score and Plan: 2 and Ondansetron, Dexamethasone and Treatment may vary due to age or medical condition  Airway Management Planned: LMA  Additional Equipment:   Intra-op Plan:   Post-operative Plan: Extubation in OR  Informed Consent: I have reviewed the patients History and Physical, chart, labs and discussed the procedure including the risks, benefits and alternatives for the proposed anesthesia with the patient or authorized representative who has indicated his/her understanding and acceptance.     Dental advisory given  Plan Discussed with: CRNA  Anesthesia Plan Comments:        Anesthesia Quick Evaluation

## 2022-04-20 NOTE — Discharge Instructions (Addendum)
General instructions: °   ° Your recent bladder surgery requires very little post hospital care but some definite precautions. ° °Despite the fact that no skin incisions were used, the area around the bladder incisions are raw and covered with scabs to promote healing and prevent bleeding. Certain precautions are needed to insure that the scabs are not disturbed over the next 2-4 weeks while the healing proceeds. ° °Because the raw surface inside your bladder and the irritating effects of urine you may expect frequency of urination and/or urgency (a stronger desire to urinate) and perhaps even getting up at night more often. This will usually resolve or improve slowly over the healing period. You may see some blood in your urine over the first 6 weeks. Do not be alarmed, even if the urine was clear for a while. Get off your feet and drink lots of fluids until clearing occurs. If you start to pass clots or don't improve call us. ° °Diet: ° °You may return to your normal diet immediately. Because of the raw surface of your bladder, alcohol, spicy foods, foods high in acid and drinks with caffeine may cause irritation or frequency and should be used in moderation. To keep your urine flowing freely and avoid constipation, drink plenty of fluids during the day (8-10 glasses). Tip: Avoid cranberry juice because it is very acidic. ° °Activity: ° °Your physical activity doesn't need to be restricted. However, if you are very active, you may see some blood in the urine. We suggest that you reduce your activity under the circumstances until the bleeding has stopped. ° °Bowels: ° °It is important to keep your bowels regular during the postoperative period. Straining with bowel movements can cause bleeding. A bowel movement every other day is reasonable. Use a mild laxative if needed, such as milk of magnesia 2-3 tablespoons, or 2 Dulcolax tablets. Call if you continue to have problems. If you had been taking narcotics for  pain, before, during or after your surgery, you may be constipated. Take a laxative if necessary. ° ° ° °Medication: ° °You should resume your pre-surgery medications unless told not to. In addition you may be given an antibiotic to prevent or treat infection. Antibiotics are not always necessary. All medication should be taken as prescribed until the bottles are finished unless you are having an unusual reaction to one of the drugs. ° ° °

## 2022-04-20 NOTE — H&P (Signed)
CC/HPI: CC/HPI: Cc: History of urinary retention, urge urinary incontinence  HPI:  72 year old male with history of urinary retention with gross hematuria. At that time, he underwent a CT without contrast on 08/15/2017. This revealed a 10 cm right renal cyst. There is no hydronephrosis or stones bilaterally. No obvious bladder tumor. He does have baseline CKD. He has not had a cystoscopy. He has been doing well on Flomax. He states that he has a good urinary stream and only nocturia 1. No further gross hematuria. Urinalysis is negative today. Patient also has significant right-sided flank pain that he believes to be secondary to a 10 cm right renal cyst.   03/24/19  Luke Foster has a hx of urinary retention, renal cysts, gross hematuria, lumbosacral radiculopathy, spinal cord stimulator implantation and removal, opioid dependence, metabolic encephalopathy, hypertension. He resides in [facility] post hospitalization from spinal stimulator removal on 7/42/59, complicated by acute post-op urinary retention, bilateral hydronephrosis and acute metabolic encephalopathy likely caused by malposition of foley in prostatic urethra and UTI. He presents today for a voiding trial. His brother brings him in today and is very involved in his care. His catheter has been in place 2 weeks. He is tolerating it well besides some slight catheter discomfort with position changes. He was having some leakage around the meatus, but that has subsided after beginning oxybutynin. He is taking oxybutynin three times a day for bladder spasms. He is also on Tamsulosin daily. He reports weakness of his lower extremities but states it is improving and he is feeling stronger daily. He denies confusion, SOB, worsening fatigue, fever, flank pain and nausea. Prior to this episode of acute retention, he was managed with tamsulosin for his LUTs and reports that he was urinating well , had a good flow to his stream and did not have any leakage,  urgency or dribbling.   03/29/19: Patient with above-noted history. He underwent successful voiding trial on 02/12; however, he did have significant bladder spasms noted at trial of void. He presents today with complaints of urinary frequency, urinary urgency, urge incontinence, and nocturia. He states that he was initially doing well following catheter removal, but irritative symptoms began approximately 3 days ago. He complains of urinary frequency of approximately every hour. He has nocturia x6-7. He notes urgency and episodes of urge incontinence, as well. He does note small volume voids, but generally feels he is emptying his bladder efficiently. He denies suprapubic pain or pressure currently. He denies gross hematuria, dysuria, fever, or chills. He does have some mild right flank pain today. He remains on tamsulosin. He is no longer using oxybutynin. He does have significant issues with constipation and is currently using MiraLax for management. He typically has a bowel movement every other day.   04/14/2019  Patient continues on tamsulosin. He continues to have significant urgency with associated urge urinary incontinence. He uses about 3 pull-ups per day. He has mild incomplete bladder emptying with a PVR of 118 cc. He was treated for urinary tract infection at the last visit. He denies any hematuria or dysuria.    11/15/2020  Patient continues to have intermittent issues with urinary retention. He has been having to have catheter changes every 2 weeks due to catheter obstruction if he waits any longer. As long as he changes it every 2 weeks he has not had much of a problem. Home health aide does irrigate his catheter every other day. This has helped tremendously. He underwent a urodynamics study which  revealed evidence of neurogenic detrusor overactivity/neurogenic bladder rather than bladder outlet obstruction. He had several uninhibited detrusor contractions and was unable to demonstrate a  voluntary contraction.   05/05/2021  Patient has tried anticholinergics and was prescribed Myrbetriq but could not afford this. Despite this, he continues to have a lot of leakage around the catheter likely secondary to bladder spasms. This is quite troublesome and bothersome to him. He wanted to discuss different options from here.   09/15/2021: Luke Foster is a 72 year old man who presents today for concerns of Foley catheter which continues to fall out of his urethra. He and his family are here to discuss this further. He is currently on Myrbetriq and Solifenacin. He is tolerating the catheter well.   02/03/22: 72 year old man who presents today for stent removal. He underwent a right URS on 01/05/22 for a 83m right ureteral stone. He has been tolerating the stent well but endorses blood in his urine. He has a foley cathete rin place as well. He is not having any pain r fevers.   03/16/22: Luke Foster today with concerns of urinary leakage and bladder spasms. He is foley catheter dependent. He is tolerating the catheter well. He has not complaints of gross blood or fevers. HE has been managed well with botox in the past ans would like to get that set up.   04/20/2022 Patient presents today for cystoscopy with intra detrusor Botox injection    ALLERGIES: Erythromycin - Itching    MEDICATIONS: Myrbetriq 50 mg tablet, extended release 24 hr 1 tablet PO Daily  Aripiprazole 5 mg tablet  Diltiazem 24Hr Er  Docusate Sodium 100 mg tablet  Enalapril Maleate  Flomax 0.4 mg capsule 1 capsule PO BID  Magnesium  Oxycodone-Acetaminophen 10 mg-325 mg tablet 1 tablet PO Q 4 H PRN  Polyethylene Glycol  Senexon-S 8.6 mg-50 mg tablet  Solifenacin Succinate 5 mg tablet 1 tablet PO Daily  Sulfamethoxazole  Trazodone Hcl 50 mg tablet  Venlafaxine Hcl  Vitamin B12  Vitamin D3     GU PSH: Complex cystometrogram, w/ void pressure and urethral pressure profile studies, any technique - 11/08/2020 Complex  Uroflow - 11/08/2020 Cysto Bladder Stone >2.5cm - 10/06/2021 Cysto Remove Stent FB Sim - 02/03/2022 Cystoscopy - 05/10/2020, 2019 Cystoscopy Insert Stent - 01/05/2022 Cystourethroscopy, W/Injection For Chemodenervation Of Bladder - 10/06/2021 Emg surf Electrd - 11/08/2020 Inject For cystogram - 11/08/2020 Intrabd voidng Press - 11/08/2020 Ureteroscopic stone removal - 01/05/2022       PSH Notes: Ureterolithotomy   NON-GU PSH: Back surgery Hernia Repair Mastectomy, Radical Neuroeltrd Stim Post Tibial - 10/05/2019, 09/29/2019, 2021, 2021, 2021, 2021, 2021, 2021, 2021, 2021, 2021, 2021     GU PMH: Unihibited neuropathic bladder - 02/03/2022, - 09/15/2021, - 05/05/2021, - 11/15/2020 Ureteral calculus - 02/03/2022 Detrusor overactivity - 09/15/2021, - 05/05/2021, - 11/15/2020 Urinary Retention - 09/15/2021, - 05/05/2021, - 11/15/2020, - 11/08/2020, - 10/21/2020, - 08/26/2020, - 06/27/2020 (Stable), - 05/10/2020 (Chronic), Instructed if unable to void in 4 hrs RTC for PVR and possible foley replacement. If he fails TOV will need UDS and cysto w/MD, - 2019 Urge incontinence - 05/05/2021, - 04/05/2020, - 2022, - 2021 Incomplete bladder emptying - 12/24/2020, - 11/26/2020, - 06/12/2020, - 2021 Acute Cystitis/UTI - 06/12/2020 BPH w/LUTS - 06/12/2020, (Stable), - 05/10/2020, - 2019 Urinary Urgency (Stable) - 06/12/2020, - 2022, - 2021 Gross hematuria - 05/10/2020, - 04/05/2020 Urinary Frequency - 2022, - 2021 Balanitis - 2021 Chronic kidney disease stage 2 (GFR 60-90) -  2019 Nocturia - 2019 Renal cyst (Stable) - 2019, Bilateral, - 2019, Renal cyst, acquired, - 2014 History of urolithiasis, Nephrolithiasis - 2014 Low back pain Renal calculus    NON-GU PMH: Other mechanical complication of other urinary catheter, subsequent encounter - 08/26/2020 Anxiety Arthritis Depression Heart disease, unspecified Hypercholesterolemia Hypertension Sleep Apnea    FAMILY HISTORY: Breast Cancer - Mother Family Health Status - Father  alive at age 78 - 80 In Family Family Health Status - Mother's Age - Runs In Family Heart Disease - Father Hypercholesterolemia - Mother   SOCIAL HISTORY: Marital Status: Married Preferred Language: English; Ethnicity: Not Hispanic Or Latino; Race: White Current Smoking Status: Patient does not smoke anymore. Has not smoked since 06/08/1991. Smoked for 10 years. Smoked 2 packs per day.   Tobacco Use Assessment Completed: Used Tobacco in last 30 days? Does not use smokeless tobacco. Has never drank.  Does not use drugs. Drinks 1 caffeinated drink per day.    REVIEW OF SYSTEMS:    GU Review Male:   Patient denies frequent urination, hard to postpone urination, burning/ pain with urination, get up at night to urinate, leakage of urine, stream starts and stops, trouble starting your stream, have to strain to urinate , erection problems, and penile pain.  Gastrointestinal (Upper):   Patient denies nausea, vomiting, and indigestion/ heartburn.  Gastrointestinal (Lower):   Patient denies diarrhea and constipation.  Constitutional:   Patient denies fever, night sweats, weight loss, and fatigue.  Skin:   Patient denies skin rash/ lesion and itching.  Endocrine:   Patient denies excessive thirst.  Musculoskeletal:   Patient denies joint pain and back pain.  Neurological:   Patient denies headaches and dizziness.  Psychologic:   Patient denies depression and anxiety.    GU PHYSICAL EXAMINATION:    Urethral Meatus: mild aquired hypospadias.    MULTI-SYSTEM PHYSICAL EXAMINATION:    Constitutional: Well-nourished. No physical deformities. Normally developed. Good grooming.   Respiratory: No labored breathing, no use of accessory muscles.   Gastrointestinal: No mass, no tenderness, no rigidity, non obese abdomen.      Complexity of Data:  Source Of History:  Patient  Records Review:   Previous Doctor Records, Previous Patient Records     ASSESSMENT:      ICD-10 Details  1 GU:   BPH  w/LUTS -urinary retention 2   Detrusor overactivity - N31.1 Chronic, Stable     PLAN:    Proceed with cystoscopy with intravesical Botox 200 units     Signed by Daine Gravel, NP on 03/23/22 at 11:18 AM (EST

## 2022-04-21 ENCOUNTER — Encounter (HOSPITAL_COMMUNITY): Payer: Self-pay | Admitting: Urology

## 2022-04-23 DIAGNOSIS — M5417 Radiculopathy, lumbosacral region: Secondary | ICD-10-CM | POA: Diagnosis not present

## 2022-04-23 DIAGNOSIS — Z466 Encounter for fitting and adjustment of urinary device: Secondary | ICD-10-CM | POA: Diagnosis not present

## 2022-04-23 DIAGNOSIS — N1330 Unspecified hydronephrosis: Secondary | ICD-10-CM | POA: Diagnosis not present

## 2022-04-23 DIAGNOSIS — N319 Neuromuscular dysfunction of bladder, unspecified: Secondary | ICD-10-CM | POA: Diagnosis not present

## 2022-04-23 DIAGNOSIS — I700 Atherosclerosis of aorta: Secondary | ICD-10-CM | POA: Diagnosis not present

## 2022-04-28 DIAGNOSIS — N319 Neuromuscular dysfunction of bladder, unspecified: Secondary | ICD-10-CM | POA: Diagnosis not present

## 2022-04-28 DIAGNOSIS — R339 Retention of urine, unspecified: Secondary | ICD-10-CM | POA: Diagnosis not present

## 2022-05-03 DIAGNOSIS — N319 Neuromuscular dysfunction of bladder, unspecified: Secondary | ICD-10-CM | POA: Diagnosis not present

## 2022-05-03 DIAGNOSIS — I700 Atherosclerosis of aorta: Secondary | ICD-10-CM | POA: Diagnosis not present

## 2022-05-03 DIAGNOSIS — N1330 Unspecified hydronephrosis: Secondary | ICD-10-CM | POA: Diagnosis not present

## 2022-05-03 DIAGNOSIS — M5417 Radiculopathy, lumbosacral region: Secondary | ICD-10-CM | POA: Diagnosis not present

## 2022-05-03 DIAGNOSIS — Z466 Encounter for fitting and adjustment of urinary device: Secondary | ICD-10-CM | POA: Diagnosis not present

## 2022-05-08 DIAGNOSIS — R338 Other retention of urine: Secondary | ICD-10-CM | POA: Diagnosis not present

## 2022-05-10 DIAGNOSIS — M5417 Radiculopathy, lumbosacral region: Secondary | ICD-10-CM | POA: Diagnosis not present

## 2022-05-10 DIAGNOSIS — I700 Atherosclerosis of aorta: Secondary | ICD-10-CM | POA: Diagnosis not present

## 2022-05-10 DIAGNOSIS — N1330 Unspecified hydronephrosis: Secondary | ICD-10-CM | POA: Diagnosis not present

## 2022-05-10 DIAGNOSIS — N319 Neuromuscular dysfunction of bladder, unspecified: Secondary | ICD-10-CM | POA: Diagnosis not present

## 2022-05-10 DIAGNOSIS — Z466 Encounter for fitting and adjustment of urinary device: Secondary | ICD-10-CM | POA: Diagnosis not present

## 2022-05-12 DIAGNOSIS — D518 Other vitamin B12 deficiency anemias: Secondary | ICD-10-CM | POA: Diagnosis not present

## 2022-05-12 DIAGNOSIS — G629 Polyneuropathy, unspecified: Secondary | ICD-10-CM | POA: Diagnosis not present

## 2022-05-12 DIAGNOSIS — E559 Vitamin D deficiency, unspecified: Secondary | ICD-10-CM | POA: Diagnosis not present

## 2022-05-12 DIAGNOSIS — R338 Other retention of urine: Secondary | ICD-10-CM | POA: Diagnosis not present

## 2022-05-12 DIAGNOSIS — Z0001 Encounter for general adult medical examination with abnormal findings: Secondary | ICD-10-CM | POA: Diagnosis not present

## 2022-05-12 DIAGNOSIS — G894 Chronic pain syndrome: Secondary | ICD-10-CM | POA: Diagnosis not present

## 2022-05-12 DIAGNOSIS — I700 Atherosclerosis of aorta: Secondary | ICD-10-CM | POA: Diagnosis not present

## 2022-05-12 DIAGNOSIS — G9332 Myalgic encephalomyelitis/chronic fatigue syndrome: Secondary | ICD-10-CM | POA: Diagnosis not present

## 2022-05-12 DIAGNOSIS — N319 Neuromuscular dysfunction of bladder, unspecified: Secondary | ICD-10-CM | POA: Diagnosis not present

## 2022-05-12 DIAGNOSIS — E782 Mixed hyperlipidemia: Secondary | ICD-10-CM | POA: Diagnosis not present

## 2022-05-13 DIAGNOSIS — R339 Retention of urine, unspecified: Secondary | ICD-10-CM | POA: Diagnosis not present

## 2022-05-28 DIAGNOSIS — N1330 Unspecified hydronephrosis: Secondary | ICD-10-CM | POA: Diagnosis not present

## 2022-05-28 DIAGNOSIS — I700 Atherosclerosis of aorta: Secondary | ICD-10-CM | POA: Diagnosis not present

## 2022-05-28 DIAGNOSIS — M5417 Radiculopathy, lumbosacral region: Secondary | ICD-10-CM | POA: Diagnosis not present

## 2022-05-28 DIAGNOSIS — Z466 Encounter for fitting and adjustment of urinary device: Secondary | ICD-10-CM | POA: Diagnosis not present

## 2022-05-28 DIAGNOSIS — N319 Neuromuscular dysfunction of bladder, unspecified: Secondary | ICD-10-CM | POA: Diagnosis not present

## 2022-06-02 DIAGNOSIS — N1330 Unspecified hydronephrosis: Secondary | ICD-10-CM | POA: Diagnosis not present

## 2022-06-02 DIAGNOSIS — Z466 Encounter for fitting and adjustment of urinary device: Secondary | ICD-10-CM | POA: Diagnosis not present

## 2022-06-02 DIAGNOSIS — I700 Atherosclerosis of aorta: Secondary | ICD-10-CM | POA: Diagnosis not present

## 2022-06-02 DIAGNOSIS — M5417 Radiculopathy, lumbosacral region: Secondary | ICD-10-CM | POA: Diagnosis not present

## 2022-06-02 DIAGNOSIS — N319 Neuromuscular dysfunction of bladder, unspecified: Secondary | ICD-10-CM | POA: Diagnosis not present

## 2022-06-09 DIAGNOSIS — N1330 Unspecified hydronephrosis: Secondary | ICD-10-CM | POA: Diagnosis not present

## 2022-06-09 DIAGNOSIS — M5417 Radiculopathy, lumbosacral region: Secondary | ICD-10-CM | POA: Diagnosis not present

## 2022-06-09 DIAGNOSIS — I700 Atherosclerosis of aorta: Secondary | ICD-10-CM | POA: Diagnosis not present

## 2022-06-09 DIAGNOSIS — Z466 Encounter for fitting and adjustment of urinary device: Secondary | ICD-10-CM | POA: Diagnosis not present

## 2022-06-09 DIAGNOSIS — N319 Neuromuscular dysfunction of bladder, unspecified: Secondary | ICD-10-CM | POA: Diagnosis not present

## 2022-06-15 DIAGNOSIS — Z466 Encounter for fitting and adjustment of urinary device: Secondary | ICD-10-CM | POA: Diagnosis not present

## 2022-06-15 DIAGNOSIS — M5417 Radiculopathy, lumbosacral region: Secondary | ICD-10-CM | POA: Diagnosis not present

## 2022-06-15 DIAGNOSIS — N319 Neuromuscular dysfunction of bladder, unspecified: Secondary | ICD-10-CM | POA: Diagnosis not present

## 2022-06-15 DIAGNOSIS — N1330 Unspecified hydronephrosis: Secondary | ICD-10-CM | POA: Diagnosis not present

## 2022-06-17 ENCOUNTER — Encounter (HOSPITAL_COMMUNITY): Payer: Self-pay | Admitting: *Deleted

## 2022-06-17 ENCOUNTER — Inpatient Hospital Stay (HOSPITAL_COMMUNITY)
Admission: EM | Admit: 2022-06-17 | Discharge: 2022-06-19 | DRG: 698 | Disposition: A | Discharge status: Home Health | Payer: Medicare Other | Attending: Internal Medicine | Admitting: Internal Medicine

## 2022-06-17 ENCOUNTER — Emergency Department (HOSPITAL_COMMUNITY): Payer: Medicare Other

## 2022-06-17 ENCOUNTER — Other Ambulatory Visit: Payer: Self-pay

## 2022-06-17 DIAGNOSIS — Z8249 Family history of ischemic heart disease and other diseases of the circulatory system: Secondary | ICD-10-CM

## 2022-06-17 DIAGNOSIS — Z83438 Family history of other disorder of lipoprotein metabolism and other lipidemia: Secondary | ICD-10-CM

## 2022-06-17 DIAGNOSIS — G8929 Other chronic pain: Secondary | ICD-10-CM | POA: Diagnosis not present

## 2022-06-17 DIAGNOSIS — N390 Urinary tract infection, site not specified: Secondary | ICD-10-CM | POA: Diagnosis not present

## 2022-06-17 DIAGNOSIS — N132 Hydronephrosis with renal and ureteral calculous obstruction: Secondary | ICD-10-CM | POA: Diagnosis not present

## 2022-06-17 DIAGNOSIS — R7402 Elevation of levels of lactic acid dehydrogenase (LDH): Secondary | ICD-10-CM | POA: Diagnosis not present

## 2022-06-17 DIAGNOSIS — N136 Pyonephrosis: Secondary | ICD-10-CM | POA: Diagnosis present

## 2022-06-17 DIAGNOSIS — Z9012 Acquired absence of left breast and nipple: Secondary | ICD-10-CM

## 2022-06-17 DIAGNOSIS — Z792 Long term (current) use of antibiotics: Secondary | ICD-10-CM | POA: Diagnosis not present

## 2022-06-17 DIAGNOSIS — A419 Sepsis, unspecified organism: Secondary | ICD-10-CM

## 2022-06-17 DIAGNOSIS — Z803 Family history of malignant neoplasm of breast: Secondary | ICD-10-CM

## 2022-06-17 DIAGNOSIS — F32A Depression, unspecified: Secondary | ICD-10-CM | POA: Diagnosis present

## 2022-06-17 DIAGNOSIS — A4152 Sepsis due to Pseudomonas: Secondary | ICD-10-CM | POA: Diagnosis not present

## 2022-06-17 DIAGNOSIS — Z79899 Other long term (current) drug therapy: Secondary | ICD-10-CM | POA: Diagnosis not present

## 2022-06-17 DIAGNOSIS — M5417 Radiculopathy, lumbosacral region: Secondary | ICD-10-CM | POA: Diagnosis not present

## 2022-06-17 DIAGNOSIS — R627 Adult failure to thrive: Secondary | ICD-10-CM | POA: Diagnosis present

## 2022-06-17 DIAGNOSIS — T83018A Breakdown (mechanical) of other indwelling urethral catheter, initial encounter: Principal | ICD-10-CM | POA: Diagnosis present

## 2022-06-17 DIAGNOSIS — G894 Chronic pain syndrome: Secondary | ICD-10-CM | POA: Diagnosis present

## 2022-06-17 DIAGNOSIS — Z6830 Body mass index (BMI) 30.0-30.9, adult: Secondary | ICD-10-CM

## 2022-06-17 DIAGNOSIS — I10 Essential (primary) hypertension: Secondary | ICD-10-CM | POA: Diagnosis present

## 2022-06-17 DIAGNOSIS — Z743 Need for continuous supervision: Secondary | ICD-10-CM | POA: Diagnosis not present

## 2022-06-17 DIAGNOSIS — Z87891 Personal history of nicotine dependence: Secondary | ICD-10-CM | POA: Diagnosis not present

## 2022-06-17 DIAGNOSIS — N3001 Acute cystitis with hematuria: Principal | ICD-10-CM

## 2022-06-17 DIAGNOSIS — I499 Cardiac arrhythmia, unspecified: Secondary | ICD-10-CM | POA: Diagnosis not present

## 2022-06-17 DIAGNOSIS — N319 Neuromuscular dysfunction of bladder, unspecified: Secondary | ICD-10-CM | POA: Diagnosis present

## 2022-06-17 DIAGNOSIS — Z9682 Presence of neurostimulator: Secondary | ICD-10-CM

## 2022-06-17 DIAGNOSIS — E785 Hyperlipidemia, unspecified: Secondary | ICD-10-CM | POA: Diagnosis present

## 2022-06-17 DIAGNOSIS — Z87442 Personal history of urinary calculi: Secondary | ICD-10-CM

## 2022-06-17 DIAGNOSIS — F419 Anxiety disorder, unspecified: Secondary | ICD-10-CM | POA: Diagnosis present

## 2022-06-17 DIAGNOSIS — Z8719 Personal history of other diseases of the digestive system: Secondary | ICD-10-CM | POA: Diagnosis not present

## 2022-06-17 DIAGNOSIS — R000 Tachycardia, unspecified: Secondary | ICD-10-CM | POA: Diagnosis not present

## 2022-06-17 DIAGNOSIS — E669 Obesity, unspecified: Secondary | ICD-10-CM | POA: Diagnosis present

## 2022-06-17 DIAGNOSIS — N281 Cyst of kidney, acquired: Secondary | ICD-10-CM | POA: Diagnosis not present

## 2022-06-17 DIAGNOSIS — R339 Retention of urine, unspecified: Secondary | ICD-10-CM | POA: Diagnosis present

## 2022-06-17 DIAGNOSIS — N1330 Unspecified hydronephrosis: Secondary | ICD-10-CM | POA: Diagnosis present

## 2022-06-17 DIAGNOSIS — R7989 Other specified abnormal findings of blood chemistry: Secondary | ICD-10-CM

## 2022-06-17 DIAGNOSIS — Y732 Prosthetic and other implants, materials and accessory gastroenterology and urology devices associated with adverse incidents: Secondary | ICD-10-CM | POA: Diagnosis present

## 2022-06-17 DIAGNOSIS — R109 Unspecified abdominal pain: Secondary | ICD-10-CM | POA: Diagnosis not present

## 2022-06-17 DIAGNOSIS — N179 Acute kidney failure, unspecified: Secondary | ICD-10-CM | POA: Diagnosis present

## 2022-06-17 DIAGNOSIS — R6521 Severe sepsis with septic shock: Secondary | ICD-10-CM | POA: Diagnosis present

## 2022-06-17 DIAGNOSIS — R6889 Other general symptoms and signs: Secondary | ICD-10-CM | POA: Diagnosis not present

## 2022-06-17 DIAGNOSIS — R338 Other retention of urine: Secondary | ICD-10-CM | POA: Diagnosis present

## 2022-06-17 DIAGNOSIS — Z881 Allergy status to other antibiotic agents status: Secondary | ICD-10-CM

## 2022-06-17 LAB — CBC WITH DIFFERENTIAL/PLATELET
Abs Immature Granulocytes: 0.04 10*3/uL (ref 0.00–0.07)
Basophils Absolute: 0.1 10*3/uL (ref 0.0–0.1)
Basophils Relative: 1 %
Eosinophils Absolute: 0.1 10*3/uL (ref 0.0–0.5)
Eosinophils Relative: 1 %
HCT: 41.4 % (ref 39.0–52.0)
Hemoglobin: 13.4 g/dL (ref 13.0–17.0)
Immature Granulocytes: 0 %
Lymphocytes Relative: 14 %
Lymphs Abs: 1.4 10*3/uL (ref 0.7–4.0)
MCH: 30.5 pg (ref 26.0–34.0)
MCHC: 32.4 g/dL (ref 30.0–36.0)
MCV: 94.1 fL (ref 80.0–100.0)
Monocytes Absolute: 0.8 10*3/uL (ref 0.1–1.0)
Monocytes Relative: 8 %
Neutro Abs: 7.7 10*3/uL (ref 1.7–7.7)
Neutrophils Relative %: 76 %
Platelets: 282 10*3/uL (ref 150–400)
RBC: 4.4 MIL/uL (ref 4.22–5.81)
RDW: 13.2 % (ref 11.5–15.5)
WBC: 10 10*3/uL (ref 4.0–10.5)
nRBC: 0 % (ref 0.0–0.2)

## 2022-06-17 LAB — COMPREHENSIVE METABOLIC PANEL
ALT: 17 U/L (ref 0–44)
AST: 25 U/L (ref 15–41)
Albumin: 4.5 g/dL (ref 3.5–5.0)
Alkaline Phosphatase: 70 U/L (ref 38–126)
Anion gap: 15 (ref 5–15)
BUN: 26 mg/dL — ABNORMAL HIGH (ref 8–23)
CO2: 16 mmol/L — ABNORMAL LOW (ref 22–32)
Calcium: 10.1 mg/dL (ref 8.9–10.3)
Chloride: 105 mmol/L (ref 98–111)
Creatinine, Ser: 1.31 mg/dL — ABNORMAL HIGH (ref 0.61–1.24)
GFR, Estimated: 59 mL/min — ABNORMAL LOW (ref >60)
Glucose, Bld: 111 mg/dL — ABNORMAL HIGH (ref 70–99)
Potassium: 4.1 mmol/L (ref 3.5–5.1)
Sodium: 136 mmol/L (ref 135–145)
Total Bilirubin: 0.7 mg/dL (ref 0.3–1.2)
Total Protein: 8.1 g/dL (ref 6.5–8.1)

## 2022-06-17 LAB — URINALYSIS, ROUTINE W REFLEX MICROSCOPIC
Bilirubin Urine: NEGATIVE
Glucose, UA: NEGATIVE mg/dL
Ketones, ur: NEGATIVE mg/dL
Nitrite: NEGATIVE
Protein, ur: 30 mg/dL — AB
RBC / HPF: >50 RBC/hpf (ref 0–5)
Specific Gravity, Urine: 1.013 (ref 1.005–1.030)
WBC, UA: >50 WBC/hpf (ref 0–5)
pH: 5 (ref 5.0–8.0)

## 2022-06-17 LAB — LIPASE, BLOOD: Lipase: 34 U/L (ref 11–51)

## 2022-06-17 LAB — CULTURE, BLOOD (ROUTINE X 2)

## 2022-06-17 LAB — LACTIC ACID, PLASMA
Lactic Acid, Venous: 5.5 mmol/L (ref 0.5–1.9)
Lactic Acid, Venous: 3.3 mmol/L (ref 0.5–1.9)
Lactic Acid, Venous: 1.9 mmol/L (ref 0.5–1.9)

## 2022-06-17 LAB — CK: Total CK: 93 U/L (ref 49–397)

## 2022-06-17 MED ORDER — ONDANSETRON HCL 4 MG/2ML IJ SOLN
4.0000 mg | Freq: Once | INTRAMUSCULAR | Status: AC
  Administered 2022-06-17: 4 mg via INTRAVENOUS
  Filled 2022-06-17: qty 2

## 2022-06-17 MED ORDER — HYDRALAZINE HCL 20 MG/ML IJ SOLN: 5.0000 mg | INTRAMUSCULAR | Status: DC | PRN

## 2022-06-17 MED ORDER — CIPROFLOXACIN IN D5W 400 MG/200ML IV SOLN
400.0000 mg | Freq: Two times a day (BID) | INTRAVENOUS | Status: DC
  Administered 2022-06-17 – 2022-06-18 (×2): 400 mg via INTRAVENOUS
  Filled 2022-06-17 (×2): qty 200

## 2022-06-17 MED ORDER — HEPARIN SODIUM (PORCINE) 5000 UNIT/ML IJ SOLN
5000.0000 [IU] | Freq: Three times a day (TID) | INTRAMUSCULAR | Status: DC
  Administered 2022-06-18 – 2022-06-19 (×4): 5000 [IU] via SUBCUTANEOUS
  Filled 2022-06-17 (×4): qty 1

## 2022-06-17 MED ORDER — SODIUM CHLORIDE 0.9 % IV SOLN
1.0000 g | Freq: Three times a day (TID) | INTRAVENOUS | Status: DC
  Administered 2022-06-17 – 2022-06-19 (×6): 1 g via INTRAVENOUS
  Filled 2022-06-17 (×6): qty 20

## 2022-06-17 MED ORDER — ACETAMINOPHEN 325 MG PO TABS
650.0000 mg | ORAL_TABLET | Freq: Four times a day (QID) | ORAL | Status: DC | PRN
  Administered 2022-06-18: 650 mg via ORAL
  Filled 2022-06-17 (×3): qty 2

## 2022-06-17 MED ORDER — HYDROMORPHONE HCL 1 MG/ML IJ SOLN
1.0000 mg | Freq: Once | INTRAMUSCULAR | Status: AC
  Administered 2022-06-17: 1 mg via INTRAVENOUS
  Filled 2022-06-17: qty 1

## 2022-06-17 MED ORDER — ACETAMINOPHEN 325 MG PO TABS
650.0000 mg | ORAL_TABLET | Freq: Once | ORAL | Status: AC
  Administered 2022-06-17: 650 mg via ORAL
  Filled 2022-06-17: qty 2

## 2022-06-17 MED ORDER — VENLAFAXINE HCL ER 75 MG PO CP24
75.0000 mg | ORAL_CAPSULE | Freq: Every day | ORAL | Status: DC
  Administered 2022-06-18 – 2022-06-19 (×2): 75 mg via ORAL
  Filled 2022-06-17 (×2): qty 1

## 2022-06-17 MED ORDER — POLYETHYLENE GLYCOL 3350 17 G PO PACK
17.0000 g | PACK | Freq: Every day | ORAL | Status: DC | PRN
  Administered 2022-06-19: 17 g via ORAL
  Filled 2022-06-17: qty 1

## 2022-06-17 MED ORDER — OXYCODONE HCL 5 MG PO TABS
15.0000 mg | ORAL_TABLET | Freq: Four times a day (QID) | ORAL | Status: DC | PRN
  Administered 2022-06-17 – 2022-06-18 (×3): 15 mg via ORAL
  Filled 2022-06-17 (×3): qty 3

## 2022-06-17 MED ORDER — SODIUM CHLORIDE 0.9 % IV BOLUS
500.0000 mL | Freq: Once | INTRAVENOUS | Status: AC
  Administered 2022-06-17: 500 mL via INTRAVENOUS

## 2022-06-17 MED ORDER — ACETAMINOPHEN 650 MG RE SUPP: 650.0000 mg | Freq: Four times a day (QID) | RECTAL | Status: DC | PRN

## 2022-06-17 MED ORDER — ENALAPRIL MALEATE 2.5 MG PO TABS: 5.0000 mg | ORAL_TABLET | Freq: Two times a day (BID) | ORAL | Status: DC

## 2022-06-17 MED ORDER — VITAMIN B-12 1000 MCG PO TABS
1000.0000 ug | ORAL_TABLET | Freq: Two times a day (BID) | ORAL | Status: DC
  Administered 2022-06-17 – 2022-06-19 (×4): 1000 ug via ORAL
  Filled 2022-06-17 (×4): qty 1

## 2022-06-17 MED ORDER — DILTIAZEM HCL ER COATED BEADS 120 MG PO CP24: 120.0000 mg | ORAL_CAPSULE | Freq: Every evening | ORAL | Status: DC

## 2022-06-17 MED ORDER — ROPINIROLE HCL 0.25 MG PO TABS
0.5000 mg | ORAL_TABLET | Freq: Every evening | ORAL | Status: DC | PRN
  Administered 2022-06-18: 0.5 mg via ORAL
  Filled 2022-06-17: qty 2

## 2022-06-17 MED ORDER — IOHEXOL 300 MG/ML  SOLN
100.0000 mL | Freq: Once | INTRAMUSCULAR | Status: AC | PRN
  Administered 2022-06-17: 100 mL via INTRAVENOUS

## 2022-06-17 MED ORDER — TAMSULOSIN HCL 0.4 MG PO CAPS
0.4000 mg | ORAL_CAPSULE | Freq: Every evening | ORAL | Status: DC
  Administered 2022-06-17 – 2022-06-18 (×2): 0.4 mg via ORAL
  Filled 2022-06-17 (×2): qty 1

## 2022-06-17 MED ORDER — OXYCODONE HCL 5 MG PO TABS
10.0000 mg | ORAL_TABLET | Freq: Once | ORAL | Status: AC
  Administered 2022-06-18: 10 mg via ORAL
  Filled 2022-06-17: qty 2

## 2022-06-17 MED ORDER — SODIUM CHLORIDE 0.9 % IV BOLUS
1000.0000 mL | Freq: Once | INTRAVENOUS | Status: AC
  Administered 2022-06-17: 1000 mL via INTRAVENOUS

## 2022-06-17 MED ORDER — MIRABEGRON ER 25 MG PO TB24
50.0000 mg | ORAL_TABLET | Freq: Every day | ORAL | Status: DC
  Administered 2022-06-17 – 2022-06-19 (×3): 50 mg via ORAL
  Filled 2022-06-17 (×3): qty 2

## 2022-06-17 MED ORDER — FESOTERODINE FUMARATE ER 4 MG PO TB24
4.0000 mg | ORAL_TABLET | Freq: Every evening | ORAL | Status: DC
  Administered 2022-06-18: 4 mg via ORAL
  Filled 2022-06-17: qty 1

## 2022-06-17 MED ORDER — VITAMIN D 25 MCG (1000 UNIT) PO TABS
2000.0000 [IU] | ORAL_TABLET | Freq: Every day | ORAL | Status: DC
  Administered 2022-06-18 – 2022-06-19 (×2): 2000 [IU] via ORAL
  Filled 2022-06-17 (×2): qty 2

## 2022-06-17 MED ORDER — DILTIAZEM HCL ER COATED BEADS 120 MG PO CP24
120.0000 mg | ORAL_CAPSULE | Freq: Every evening | ORAL | Status: DC
  Administered 2022-06-18: 120 mg via ORAL
  Filled 2022-06-17: qty 1

## 2022-06-17 NOTE — Progress Notes (Signed)
Pharmacy Antibiotic Note  Luke Foster is a 72 y.o. male admitted on 06/17/2022 with UTI.  Pharmacy has been consulted for meropenem and ciprofloxacin dosing. Patient with history of multi-resistant UTI. Most recent urine culture grew >100k of enterbacter cloacae S to cefepime, gentamicin, imipenem, and bactrim as well as pseudomonas aeruginosa.   Plan: Ciprofloxacin 400mg  IV q12h  Meropenem 1g IV q8h. Will monitor patient's renal function and clinical improvement  Height: 5\' 8"  (172.7 cm) Weight: 90.7 kg (200 lb) IBW/kg (Calculated) : 68.4  Temp (24hrs), Avg:99.8 F (37.7 C), Min:98.9 F (37.2 C), Max:100.7 F (38.2 C)  Recent Labs  Lab 06/17/22 1323 06/17/22 1514 06/17/22 1646  WBC 10.0  --   --   CREATININE 1.31*  --   --   LATICACIDVEN 5.5* 3.3* 1.9    Estimated Creatinine Clearance: 57.4 mL/min (A) (by C-G formula based on SCr of 1.31 mg/dL (H)).    Allergies  Allergen Reactions   Erythromycin Itching    Antimicrobials this admission:   Dose adjustments this admission:   Microbiology results: 5/8 BCx: pending 5/8 UCx: pending   Thank you for allowing pharmacy to be a part of this patient's care.  Benetta Spar Rukiya Hodgkins 06/17/2022 6:39 PM

## 2022-06-17 NOTE — ED Notes (Addendum)
ED TO INPATIENT HANDOFF REPORT  ED Nurse Name and Phone #: Morrie Sheldon [phone]  S Name/Age/Gender Luke Foster 72 y.o. male Room/Bed: APA05/APA05  Code Status   Code Status: Prior  Home/SNF/Other Skilled nursing facility Patient oriented to: self, place, and situation Is this baseline? Yes   Triage Complete: Triage complete  Chief Complaint Complicated UTI (urinary tract infection) [N39.0]  Triage Note Pt BIB [facility] for c/o abdominal pain; pt states the pain was sudden and denies any n/v/d  Pt states he took 2 of his oxycodone 10mg   this am with no relief  Pt states he has taken miralax and an enema with no significant results   Allergies Allergies  Allergen Reactions   Erythromycin Itching    Level of Care/Admitting Diagnosis ED Disposition     ED Disposition  Admit   Condition  --   Comment  Hospital Area: [facility] [[id number]]  Level of Care: Telemetry [5]  Covid Evaluation: Asymptomatic - no recent exposure (last 10 days) testing not required  Diagnosis: Complicated UTI (urinary tract infection) [478295]  Admitting Physician: Chiquita Loth  Attending Physician: Randol Kern, DAWOOD S [[id number]  Certification:: I certify this patient will need inpatient services for at least 2 midnights  Estimated Length of Stay: 2          B Medical/Surgery History Past Medical History:  Diagnosis Date   Arthritis    Chronic back pain    Chronic pain 1999   Bilateral feet (R >L)   Difficult or painful urination    High blood pressure    History of kidney stones    H/O   Nerve damage    Pain management    Paresthesia of foot, bilateral    Renal disorder    Sleep apnea    USES CPAP   Weakness of both legs    Past Surgical History:  Procedure Laterality Date   arm surgery     BACK SURGERY     X2   BOTOX INJECTION N/A 10/06/2021   Procedure: CYSTOSCOPY LITHOLAPAXY WITH BOTOX INJECTION;  Surgeon: Crista Elliot, MD;  Location: [facility];   Service: Urology;  Laterality: N/A;   BOTOX INJECTION N/A 04/20/2022   Procedure: CYSTOSCOPY & BOTOX INJECTION, BLADDER FULGARATION;  Surgeon: Crista Elliot, MD;  Location: [facility];  Service: Urology;  Laterality: N/A;  45 MINS   BOWEL RESECTION N/A 01/15/2022   Procedure: SMALL BOWEL RESECTION;  Surgeon: Lewie Chamber, DO;  Location: [facility];  Service: General;  Laterality: N/A;   CYSTOSCOPY W/ URETERAL STENT PLACEMENT Right 11/29/2021   Procedure: CYSTOSCOPY WITH RETROGRADE PYELOGRAM/URETERAL STENT PLACEMENT;  Surgeon: Bjorn Pippin, MD;  Location: [facility];  Service: Urology;  Laterality: Right;   CYSTOSCOPY/URETEROSCOPY/HOLMIUM LASER/STENT PLACEMENT Right 01/05/2022   Procedure: CYSTOSCOPY RIGHT URETEROSCOPY/HOLMIUM LASER/STENT PLACEMENT basket extraction stones;  Surgeon: Crista Elliot, MD;  Location: [facility];  Service: Urology;  Laterality: Right;  1 HR FOR CASE   FACIAL COSMETIC SURGERY     HERNIA REPAIR     IR US GUIDE BX ASP/DRAIN  10/19/2017   KIDNEY STONE SURGERY     LAPAROTOMY N/A 01/15/2022   Procedure: Exploratory laparotomy;  Surgeon: Lewie Chamber, DO;  Location: [facility];  Service: General;  Laterality: N/A;   LUMBAR SPINAL CORD SIMULATOR LEAD REMOVAL N/A 03/01/2019   Procedure: LUMBAR SPINAL CORD SIMULATOR LEAD REMOVAL;  Surgeon: Venetia Night, MD;  Location: [facility];  Service: Neurosurgery;  Laterality:  N/A;   MASTECTOMY Left    SPINAL CORD STIMULATOR REMOVAL N/A 03/01/2019   Procedure: LUMBAR SPINAL CORD STIMULATOR REMOVAL;  Surgeon: Venetia Night, MD;  Location: [facility];  Service: Neurosurgery;  Laterality: N/A;   WISDOM TOOTH EXTRACTION       A IV Location/Drains/Wounds Patient Lines/Drains/Airways Status     Active Line/Drains/Airways     Name Placement date Placement time Site Days   Peripheral IV 06/17/22 20 G Left Forearm 06/17/22  1343  Forearm  less than 1   Urethral Catheter [place] NT+3 Latex 18 Fr. 06/17/22  1452  Latex   less than 1   Ureteral Drain/Stent Right ureter 6 Fr. 01/05/22  1128  Right ureter  163            Intake/Output Last 24 hours  Intake/Output Summary (Last 24 hours) at 06/17/2022 2025 Last data filed at 06/17/2022 2002 Gross per 24 hour  Intake 1700 ml  Output 2100 ml  Net -400 ml    Labs/Imaging Results for orders placed or performed during the hospital encounter of 06/17/22 (from the past 48 hour(s))  Comprehensive metabolic panel     Status: Abnormal   Collection Time: 06/17/22  1:23 PM  Result Value Ref Range   Sodium 136 135 - 145 mmol/L   Potassium 4.1 3.5 - 5.1 mmol/L   Chloride 105 98 - 111 mmol/L   CO2 16 (L) 22 - 32 mmol/L   Glucose, Bld 111 (H) 70 - 99 mg/dL    Comment: Glucose reference range applies only to samples taken after fasting for at least 8 hours.   BUN 26 (H) 8 - 23 mg/dL   Creatinine, Ser 1.61 (H) 0.61 - 1.24 mg/dL   Calcium 09.6 8.9 - 04.5 mg/dL   Total Protein 8.1 6.5 - 8.1 g/dL   Albumin 4.5 3.5 - 5.0 g/dL   AST 25 15 - 41 U/L   ALT 17 0 - 44 U/L   Alkaline Phosphatase 70 38 - 126 U/L   Total Bilirubin 0.7 0.3 - 1.2 mg/dL   GFR, Estimated 59 (L) >60 mL/min    Comment: (NOTE) Calculated using the CKD-EPI Creatinine Equation (2021)    Anion gap 15 5 - 15    Comment: Performed at [facility], [address] [place], [place] [address]  CBC with Differential     Status: None   Collection Time: 06/17/22  1:23 PM  Result Value Ref Range   WBC 10.0 4.0 - 10.5 K/uL   RBC 4.40 4.22 - 5.81 MIL/uL   Hemoglobin 13.4 13.0 - 17.0 g/dL   HCT 19.1 47.8 - 29.5 %   MCV 94.1 80.0 - 100.0 fL   MCH 30.5 26.0 - 34.0 pg   MCHC 32.4 30.0 - 36.0 g/dL   RDW 62.1 30.8 - 65.7 %   Platelets 282 150 - 400 K/uL   nRBC 0.0 0.0 - 0.2 %   Neutrophils Relative % 76 %   Neutro Abs 7.7 1.7 - 7.7 K/uL   Lymphocytes Relative 14 %   Lymphs Abs 1.4 0.7 - 4.0 K/uL   Monocytes Relative 8 %   Monocytes Absolute 0.8 0.1 - 1.0 K/uL   Eosinophils Relative 1 %   Eosinophils  Absolute 0.1 0.0 - 0.5 K/uL   Basophils Relative 1 %   Basophils Absolute 0.1 0.0 - 0.1 K/uL   Immature Granulocytes 0 %   Abs Immature Granulocytes 0.04 0.00 - 0.07 K/uL    Comment: Performed at  [facility], [address] [place], [place] [address]  Lipase, blood     Status: None   Collection Time: 06/17/22  1:23 PM  Result Value Ref Range   Lipase 34 11 - 51 U/L    Comment: Performed at [facility], [address] [place], [place] [address]  Lactic acid, plasma     Status: Abnormal   Collection Time: 06/17/22  1:23 PM  Result Value Ref Range   Lactic Acid, Venous 5.5 (HH) 0.5 - 1.9 mmol/L    Comment: CRITICAL RESULT CALLED TO, READ BACK BY AND VERIFIED WITH CONNIE KING @ 1400 ON 06/17/22 C VARNER Performed at [facility], [address], [place], [place] [address]   Urinalysis, Routine w reflex microscopic -Urine, Clean Catch     Status: Abnormal   Collection Time: 06/17/22  2:51 PM  Result Value Ref Range   Color, Urine YELLOW YELLOW   APPearance HAZY (A) CLEAR   Specific Gravity, Urine 1.013 1.005 - 1.030   pH 5.0 5.0 - 8.0   Glucose, UA NEGATIVE NEGATIVE mg/dL   Hgb urine dipstick LARGE (A) NEGATIVE   Bilirubin Urine NEGATIVE NEGATIVE   Ketones, ur NEGATIVE NEGATIVE mg/dL   Protein, ur 30 (A) NEGATIVE mg/dL   Nitrite NEGATIVE NEGATIVE   Leukocytes,Ua MODERATE (A) NEGATIVE   RBC / HPF >50 0 - 5 RBC/hpf   WBC, UA >50 0 - 5 WBC/hpf   Bacteria, UA RARE (A) NONE SEEN   Squamous Epithelial / HPF 0-5 0 - 5 /HPF   WBC Clumps PRESENT     Comment: Performed at [facility], [address] [place], [place] [address]  Lactic acid, plasma     Status: Abnormal   Collection Time: 06/17/22  3:14 PM  Result Value Ref Range   Lactic Acid, Venous 3.3 (HH) 0.5 - 1.9 mmol/L    Comment: CRITICAL RESULT CALLED TO, READ BACK BY AND VERIFIED WITH DANNER,A ON 06/17/22 AT 1615 BY LOY,C Performed at [facility], [address] [place], [place] [address]   Lactic acid, plasma     Status: None    Collection Time: 06/17/22  4:46 PM  Result Value Ref Range   Lactic Acid, Venous 1.9 0.5 - 1.9 mmol/L    Comment: Performed at [facility], [address] [place], [place] [address]  CK     Status: None   Collection Time: 06/17/22  4:46 PM  Result Value Ref Range   Total CK 93 49 - 397 U/L    Comment: Performed at [facility], [address], [place], [place] [address]  Blood culture (routine x 2)     Status: None (Preliminary result)   Collection Time: 06/17/22  6:47 PM   Specimen: BLOOD  Result Value Ref Range   Specimen Description BLOOD BLOOD RIGHT ARM    Special Requests      BOTTLES DRAWN AEROBIC AND ANAEROBIC Blood Culture adequate volume Performed at [facility], [address], [place], [place] [address]    Culture PENDING    Report Status PENDING   Blood culture (routine x 2)     Status: None (Preliminary result)   Collection Time: 06/17/22  6:47 PM   Specimen: BLOOD  Result Value Ref Range   Specimen Description BLOOD BLOOD RIGHT WRIST    Special Requests      BOTTLES DRAWN AEROBIC AND ANAEROBIC Blood Culture adequate volume Performed at [facility], [address] [place], [place] [address]    Culture PENDING  Report Status PENDING    CT ABDOMEN PELVIS W CONTRAST  Result Date: 06/17/2022 CLINICAL DATA:  Sudden onset abdominal pain. Bowel obstruction suspected. EXAM: CT ABDOMEN AND PELVIS WITH CONTRAST TECHNIQUE: Multidetector CT imaging of the abdomen and pelvis was performed using the standard protocol following bolus administration of intravenous contrast. RADIATION DOSE REDUCTION: This exam was performed according to the departmental dose-optimization program which includes automated exposure control, adjustment of the mA and/or kV according to patient size and/or use of iterative reconstruction technique. CONTRAST:  OMNIPAQUE IOHEXOL 300 MG/ML  SOLN COMPARISON:  01/12/2022 FINDINGS: Lower chest: Similar appearance of chronic atelectasis or scarring in the  inferior left posterior lung base. Hepatobiliary: No suspicious focal abnormality within the liver parenchyma. There is no evidence for gallstones, gallbladder wall thickening, or pericholecystic fluid. No intrahepatic or extrahepatic biliary dilation. Pancreas: No focal mass lesion. No dilatation of the main duct. No intraparenchymal cyst. No peripancreatic edema. Spleen: No splenomegaly. No focal mass lesion. Adrenals/Urinary Tract: No adrenal nodule or mass. 7 x 6 mm stone in the lower pole right kidney is similar to prior. 12.4 cm simple cyst noted in the interpolar right kidney, only minimally increased from 12.0 cm previously. No followup imaging is recommended. There is right-sided mild to moderate hydroureteronephrosis with dilated right ureter extending down to the level of the right UVJ. No obstructing stone or mass lesion evident by CT imaging. Mild fullness noted left intrarenal collecting system and ureter with ureteral distension extending to the level of the left UVJ. No obstructing stone or mass lesion visible on the left. No substantial change 3 cm exophytic interpolar left renal cyst with simple imaging features. No followup imaging is recommended.Foley catheter is identified in the urinary bladder although the bladder is not decompressed. Stomach/Bowel: Stomach is unremarkable. No gastric wall thickening. No evidence of outlet obstruction. Duodenum is normally positioned as is the ligament of Treitz. No small bowel wall thickening. No small bowel dilatation. The terminal ileum is normal. No gross colonic mass. No colonic wall thickening. Vascular/Lymphatic: There is moderate atherosclerotic calcification of the abdominal aorta without aneurysm. There is no gastrohepatic or hepatoduodenal ligament lymphadenopathy. No retroperitoneal or mesenteric lymphadenopathy. No pelvic sidewall lymphadenopathy. Reproductive: Prostate gland is enlarged. Other: No intraperitoneal free fluid. Musculoskeletal: No  worrisome lytic or sclerotic osseous abnormality. Superior endplate compression deformity at T12 is stable in the interval IMPRESSION: 1. Bilateral mild to moderate hydroureteronephrosis with dilated ureters extending down to the UVJ level on both sides. No obstructing stone or mass lesion evident by CT imaging. Urinary bladder is moderately distended despite the presence of a Foley catheter. 2. 7 x 6 mm stone in the lower pole right kidney, similar to prior. 3. Bilateral renal cysts. 4. Marked Prostatomegaly. 5.  Aortic Atherosclerosis (ICD10-I70.0). Electronically Signed   By: Kennith Center M.D.   On: 06/17/2022 14:54    Pending Labs Unresulted Labs (From admission, onward)     Start     Ordered   06/17/22 1820  Urine Culture (for pregnant, neutropenic or urologic patients or patients with an indwelling urinary catheter)  (Urine Labs)  Once,   URGENT       Question:  Indication  Answer:  Dysuria   06/17/22 1820            Vitals/Pain Today's Vitals   06/17/22 1730 06/17/22 1800 06/17/22 1816 06/17/22 1830  BP: 135/81 126/70  (!) 127/115  Pulse:    (!) 122  Resp:  Temp:   (!) 100.7 F (38.2 C)   TempSrc:   Oral   SpO2:    98%  Weight:      Height:      PainSc:        Isolation Precautions No active isolations  Medications Medications  ciprofloxacin (CIPRO) IVPB 400 mg (0 mg Intravenous Stopped 06/17/22 2002)  meropenem (MERREM) 1 g in sodium chloride 0.9 % 100 mL IVPB (has no administration in time range)  ondansetron (ZOFRAN) injection 4 mg (4 mg Intravenous Given 06/17/22 1343)  HYDROmorphone (DILAUDID) injection 1 mg (1 mg Intravenous Given 06/17/22 1343)  sodium chloride 0.9 % bolus 500 mL (0 mLs Intravenous Stopped 06/17/22 1503)  iohexol (OMNIPAQUE) 300 MG/ML solution 100 mL (100 mLs Intravenous Contrast Given 06/17/22 1412)  sodium chloride 0.9 % bolus 1,000 mL (0 mLs Intravenous Stopped 06/17/22 1812)  HYDROmorphone (DILAUDID) injection 1 mg (1 mg Intravenous Given 06/17/22  1809)  acetaminophen (TYLENOL) tablet 650 mg (650 mg Oral Given 06/17/22 1854)    Mobility non-ambulatory     Focused Assessments     R Recommendations: See Admitting Provider Note  Report given to:   Additional Notes:

## 2022-06-17 NOTE — H&P (Signed)
TRH H&P   Patient Demographics:    Luke Foster, is a 72 y.o. male  MRN: [id number]   DOB - 08-17-1950  Admit Date - 06/17/2022  Outpatient Primary MD for the patient is Elfredia Nevins, MD  Referring MD/NP/PA: Dr Golda Acre  Patient coming from: home  Chief Complaint  Patient presents with   Abdominal Pain      HPI:    Luke Foster  is a 72 y.o. male,  with history of chronic back pain, neck pain status post spinal cord stimulator placement, lumbosacral radiculopathy, nephrolithiasis, bilateral hydronephrosis, hyperlipidemia, neurogenic bladder with Foley catheter . -Patient presents to ED secondary to complaints of abdominal pain, nausea, patient reports acute onset of abdominal pain this morning, he is with history of small bowel obstruction in the past which prompted him to come to ED as states it feels similar, he reports nausea, with no vomiting, he denies any constipation or obstipation, ports feeling warm, but did not check for fever. -While in ED patient spiked temperature 100.7, CT abdomen pelvis significant for bilateral hydronephrosis and distended bladder despite Foley present, suspicion for urinary retention and Foley malfunction, so it was exchanged in ED with immediate 1000 cc urine output, currently has 2000 cc in the bag, they were strongly positive, with known history of Enterobacter and Pseudomonas infection in the past, he was started on meropenem and ciprofloxacin, and [facility] consulted to admit.   Review of systems:      A full 10 point Review of Systems was done, except as stated above, all other Review of Systems were negative.   With Past History of the following :    Past Medical History:  Diagnosis Date   Arthritis    Chronic back pain    Chronic pain 1999   Bilateral feet (R >L)   Difficult or painful urination    High blood pressure     History of kidney stones    H/O   Nerve damage    Pain management    Paresthesia of foot, bilateral    Renal disorder    Sleep apnea    USES CPAP   Weakness of both legs       Past Surgical History:  Procedure Laterality Date   arm surgery     BACK SURGERY     X2   BOTOX INJECTION N/A 10/06/2021   Procedure: CYSTOSCOPY LITHOLAPAXY WITH BOTOX INJECTION;  Surgeon: Crista Elliot, MD;  Location: [facility];  Service: Urology;  Laterality: N/A;   BOTOX INJECTION N/A 04/20/2022   Procedure: CYSTOSCOPY & BOTOX INJECTION, BLADDER FULGARATION;  Surgeon: Crista Elliot, MD;  Location: [facility];  Service: Urology;  Laterality: N/A;  45 MINS   BOWEL RESECTION N/A 01/15/2022   Procedure: SMALL BOWEL RESECTION;  Surgeon: Lewie Chamber, DO;  Location: [facility];  Service: General;  Laterality: N/A;  CYSTOSCOPY W/ URETERAL STENT PLACEMENT Right 11/29/2021   Procedure: CYSTOSCOPY WITH RETROGRADE PYELOGRAM/URETERAL STENT PLACEMENT;  Surgeon: Bjorn Pippin, MD;  Location: [facility];  Service: Urology;  Laterality: Right;   CYSTOSCOPY/URETEROSCOPY/HOLMIUM LASER/STENT PLACEMENT Right 01/05/2022   Procedure: CYSTOSCOPY RIGHT URETEROSCOPY/HOLMIUM LASER/STENT PLACEMENT basket extraction stones;  Surgeon: Crista Elliot, MD;  Location: [facility];  Service: Urology;  Laterality: Right;  1 HR FOR CASE   FACIAL COSMETIC SURGERY     HERNIA REPAIR     IR US GUIDE BX ASP/DRAIN  10/19/2017   KIDNEY STONE SURGERY     LAPAROTOMY N/A 01/15/2022   Procedure: Exploratory laparotomy;  Surgeon: Lewie Chamber, DO;  Location: [facility];  Service: General;  Laterality: N/A;   LUMBAR SPINAL CORD SIMULATOR LEAD REMOVAL N/A 03/01/2019   Procedure: LUMBAR SPINAL CORD SIMULATOR LEAD REMOVAL;  Surgeon: Venetia Night, MD;  Location: [facility];  Service: Neurosurgery;  Laterality: N/A;   MASTECTOMY Left    SPINAL CORD STIMULATOR REMOVAL N/A 03/01/2019   Procedure: LUMBAR SPINAL CORD STIMULATOR REMOVAL;  Surgeon:  Venetia Night, MD;  Location: [facility];  Service: Neurosurgery;  Laterality: N/A;   WISDOM TOOTH EXTRACTION        Social History:     Social History   Tobacco Use   Smoking status: Former    Packs/day: 2.00    Years: 10.00    Additional pack years: 0.00    Total pack years: 20.00    Types: Cigarettes    Quit date: 05/21/1991    Years since quitting: 31.0   Smokeless tobacco: Former    Quit date: 06/08/1991  Substance Use Topics   Alcohol use: Yes    Comment: rare        Family History :     Family History  Problem Relation Age of Onset   Heart disease Father        Living, 53   Breast cancer Mother        Living, 27   Hypercholesterolemia Mother    Healthy Brother    Healthy Sister       Home Medications:   Prior to Admission medications   Medication Sig Start Date End Date Taking? Authorizing Provider  acetaminophen (TYLENOL) 500 MG tablet Take 1,000 mg by mouth every 6 (six) hours as needed for mild pain or moderate pain.   Yes [provider]  ascorbic acid (VITAMIN C) 500 MG tablet Take 500 mg by mouth daily.   Yes [provider]  cholecalciferol (VITAMIN D3) 25 MCG (1000 UNIT) tablet Take 2 tablets (2,000 Units total) by mouth daily. 12/01/21  Yes Rai, Ripudeep K, MD  cyanocobalamin (VITAMIN B12) 500 MCG tablet Take 2 tablets (1,000 mcg total) by mouth 2 (two) times daily. 12/01/21  Yes Rai, Ripudeep K, MD  diltiazem (CARDIZEM CD) 120 MG 24 hr capsule Take 1 capsule (120 mg total) by mouth every evening. 05/17/20  Yes Medina-Vargas, Monina C, NP  docusate sodium (COLACE) 100 MG capsule Take 1 capsule (100 mg total) by mouth in the morning, at noon, and at bedtime. Patient taking differently: Take 300 mg by mouth at bedtime. 05/17/20  Yes Medina-Vargas, Monina C, NP  enalapril (VASOTEC) 5 MG tablet Take 5 mg by mouth 2 (two) times daily. 01/19/22  Yes [provider]  mirabegron ER (MYRBETRIQ) 50 MG TB24 tablet Take 1 tablet (50  mg total) by mouth daily. 05/17/20  Yes Medina-Vargas, Monina C, NP  NON FORMULARY Pt uses a  cpap at night   Yes [provider]  ondansetron (ZOFRAN) 4 MG tablet Take 1 tablet (4 mg total) by mouth every 8 (eight) hours as needed for nausea or vomiting. 12/01/21  Yes Rai, Ripudeep K, MD  oxyCODONE (ROXICODONE) 15 MG immediate release tablet Take 1 tablet (15 mg total) by mouth every 6 (six) hours as needed for pain. 05/17/20  Yes Medina-Vargas, Monina C, NP  polyethylene glycol (MIRALAX / GLYCOLAX) 17 g packet Take 17 g by mouth 2 (two) times daily. Patient taking differently: Take 17 g by mouth daily as needed for mild constipation or moderate constipation. 05/17/20  Yes Medina-Vargas, Monina C, NP  rOPINIRole (REQUIP) 0.5 MG tablet Take 0.5 mg by mouth at bedtime as needed (restless legs). 09/22/21  Yes [provider]  solifenacin (VESICARE) 5 MG tablet Take 5 mg by mouth daily. 07/17/21  Yes [provider]  sulfamethoxazole-trimethoprim (BACTRIM) 400-80 MG tablet Take 1 tablet by mouth at bedtime. 12/18/21  Yes [provider]  tamsulosin (FLOMAX) 0.4 MG CAPS capsule Take 1 capsule (0.4 mg total) by mouth every evening. 05/17/20  Yes Medina-Vargas, Monina C, NP  venlafaxine XR (EFFEXOR-XR) 75 MG 24 hr capsule Take 1 capsule (75 mg total) by mouth in the morning and at bedtime. 05/17/20  Yes Medina-Vargas, Monina C, NP  methylPREDNISolone (MEDROL DOSEPAK) 4 MG TBPK tablet Take by mouth. 06/17/22   [provider]     Allergies:     Allergies  Allergen Reactions   Erythromycin Itching     Physical Exam:   Vitals  Blood pressure (!) 127/115, pulse (!) 122, temperature (!) 100.7 F (38.2 C), temperature source Oral, resp. rate 19, height 5\' 8"  (1.727 m), weight 90.7 kg, SpO2 98 %.   1. General developed male, laying in bed, no apparent distress  2. Normal affect and insight, Not Suicidal or Homicidal, Awake Alert, Oriented X 3.  3. No F.N deficits, ALL  C.Nerves Intact, right-sided weakness, mainly in right lower extremity   4. Ears and Eyes appear Normal, Conjunctivae clear, PERRLA. Moist Oral Mucosa.  5. Supple Neck, No JVD, No cervical lymphadenopathy appriciated, No Carotid Bruits.  6. Symmetrical Chest wall movement, Good air movement bilaterally, CTAB.  7. RRR, No Gallops, Rubs or Murmurs, No Parasternal Heave.  8. Positive Bowel Sounds, Abdomen Soft, No tenderness, No organomegaly appriciated,No rebound -guarding or rigidity.  9.  No Cyanosis, Normal Skin Turgor, No Skin Rash or Bruise.      Data Review:    CBC Recent Labs  Lab 06/17/22 1323  WBC 10.0  HGB 13.4  HCT 41.4  PLT 282  MCV 94.1  MCH 30.5  MCHC 32.4  RDW 13.2  LYMPHSABS 1.4  MONOABS 0.8  EOSABS 0.1  BASOSABS 0.1   ------------------------------------------------------------------------------------------------------------------  Chemistries  Recent Labs  Lab 06/17/22 1323  NA 136  K 4.1  CL 105  CO2 16*  GLUCOSE 111*  BUN 26*  CREATININE 1.31*  CALCIUM 10.1  AST 25  ALT 17  ALKPHOS 70  BILITOT 0.7   ------------------------------------------------------------------------------------------------------------------ estimated creatinine clearance is 57.4 mL/min (A) (by C-G formula based on SCr of 1.31 mg/dL (H)). ------------------------------------------------------------------------------------------------------------------ No results for input(s): "TSH", "T4TOTAL", "T3FREE", "THYROIDAB" in the last 72 hours.  Invalid input(s): "FREET3"  Coagulation profile No results for input(s): "INR", "PROTIME" in the last 168 hours. ------------------------------------------------------------------------------------------------------------------- No results for input(s): "DDIMER" in the last 72 hours. -------------------------------------------------------------------------------------------------------------------  Cardiac Enzymes No results  for input(s): "CKMB", "TROPONINI", "MYOGLOBIN" in the  last 168 hours.  Invalid input(s): "CK" ------------------------------------------------------------------------------------------------------------------    Component Value Date/Time   BNP 31.2 06/17/2020 1255     ---------------------------------------------------------------------------------------------------------------  Urinalysis    Component Value Date/Time   COLORURINE YELLOW 06/17/2022 1451   APPEARANCEUR HAZY (A) 06/17/2022 1451   LABSPEC 1.013 06/17/2022 1451   PHURINE 5.0 06/17/2022 1451   GLUCOSEU NEGATIVE 06/17/2022 1451   HGBUR LARGE (A) 06/17/2022 1451   BILIRUBINUR NEGATIVE 06/17/2022 1451   KETONESUR NEGATIVE 06/17/2022 1451   PROTEINUR 30 (A) 06/17/2022 1451   UROBILINOGEN 1.0 08/16/2009 1138   NITRITE NEGATIVE 06/17/2022 1451   LEUKOCYTESUR MODERATE (A) 06/17/2022 1451    ----------------------------------------------------------------------------------------------------------------   Imaging Results:    CT ABDOMEN PELVIS W CONTRAST  Result Date: 06/17/2022 CLINICAL DATA:  Sudden onset abdominal pain. Bowel obstruction suspected. EXAM: CT ABDOMEN AND PELVIS WITH CONTRAST TECHNIQUE: Multidetector CT imaging of the abdomen and pelvis was performed using the standard protocol following bolus administration of intravenous contrast. RADIATION DOSE REDUCTION: This exam was performed according to the departmental dose-optimization program which includes automated exposure control, adjustment of the mA and/or kV according to patient size and/or use of iterative reconstruction technique. CONTRAST:  OMNIPAQUE IOHEXOL 300 MG/ML  SOLN COMPARISON:  01/12/2022 FINDINGS: Lower chest: Similar appearance of chronic atelectasis or scarring in the inferior left posterior lung base. Hepatobiliary: No suspicious focal abnormality within the liver parenchyma. There is no evidence for gallstones, gallbladder wall thickening,  or pericholecystic fluid. No intrahepatic or extrahepatic biliary dilation. Pancreas: No focal mass lesion. No dilatation of the main duct. No intraparenchymal cyst. No peripancreatic edema. Spleen: No splenomegaly. No focal mass lesion. Adrenals/Urinary Tract: No adrenal nodule or mass. 7 x 6 mm stone in the lower pole right kidney is similar to prior. 12.4 cm simple cyst noted in the interpolar right kidney, only minimally increased from 12.0 cm previously. No followup imaging is recommended. There is right-sided mild to moderate hydroureteronephrosis with dilated right ureter extending down to the level of the right UVJ. No obstructing stone or mass lesion evident by CT imaging. Mild fullness noted left intrarenal collecting system and ureter with ureteral distension extending to the level of the left UVJ. No obstructing stone or mass lesion visible on the left. No substantial change 3 cm exophytic interpolar left renal cyst with simple imaging features. No followup imaging is recommended.Foley catheter is identified in the urinary bladder although the bladder is not decompressed. Stomach/Bowel: Stomach is unremarkable. No gastric wall thickening. No evidence of outlet obstruction. Duodenum is normally positioned as is the ligament of Treitz. No small bowel wall thickening. No small bowel dilatation. The terminal ileum is normal. No gross colonic mass. No colonic wall thickening. Vascular/Lymphatic: There is moderate atherosclerotic calcification of the abdominal aorta without aneurysm. There is no gastrohepatic or hepatoduodenal ligament lymphadenopathy. No retroperitoneal or mesenteric lymphadenopathy. No pelvic sidewall lymphadenopathy. Reproductive: Prostate gland is enlarged. Other: No intraperitoneal free fluid. Musculoskeletal: No worrisome lytic or sclerotic osseous abnormality. Superior endplate compression deformity at T12 is stable in the interval IMPRESSION: 1. Bilateral mild to moderate  hydroureteronephrosis with dilated ureters extending down to the UVJ level on both sides. No obstructing stone or mass lesion evident by CT imaging. Urinary bladder is moderately distended despite the presence of a Foley catheter. 2. 7 x 6 mm stone in the lower pole right kidney, similar to prior. 3. Bilateral renal cysts. 4. Marked Prostatomegaly. 5.  Aortic Atherosclerosis (ICD10-I70.0). Electronically Signed   By: Jamison Oka.D.  On: 06/17/2022 14:54      Assessment & Plan:    Principal Problem:   Complicated UTI (urinary tract infection) Active Problems:   Bilateral hydronephrosis   Hypertension   Benign essential HTN   Acute urinary retention   Urinary obstruction with bilateral hydronephrosis in the setting of malfunctioning Foley catheter -Foley catheter was found to be clogged in ED, was exchanged with immediate around 1000 urine output - . Already had 2000 cc urine output, monitor closely for post obstruction diuresis, with daily labs. -Continue with IV fluids. -UA strongly positive, with history of recurrent UTIs, previous cultures growing Enterococcus and Pseudomonas, so continue with meropenem and ciprofloxacin for now, and narrow 1 more cultures available.   Hypertension: -Blood pressure acceptable, will resume Cardizem CD from tomorrow and will hold Vasotec.    Failure to thrive/chronic physical debility:  - He is mostly bed to wheelchair.  Able to walk with a walker for short distance at home.   -PT/OT consulted .   Depression/anxiety: -Able, continue with Effexor  DVT Prophylaxis Heparin  AM Labs Ordered, also please review Full Orders  Family Communication: Admission, patients condition and plan of care including tests being ordered have been discussed with the patient who indicate understanding and agree with the plan and Code Status.  Code Status Full  Likely DC to  home  Condition GUARDED    Consults called: none    Admission status: inpatient     Time spent in minutes : 70 minutes   Huey Bienenstock M.D on 06/17/2022 at 8:29 PM   [facility] - Office  [phone]

## 2022-06-17 NOTE — ED Provider Notes (Signed)
[place] EMERGENCY DEPARTMENT AT [facility] Provider Note   CSN: [id number] Arrival date & time: 06/17/22  1248     History  Chief Complaint  Patient presents with   Abdominal Pain    Luke Foster is a 72 y.o. male.  HPI   72 year old male presents emergency department complaints of abdominal pain, nausea.  Patient reports acute onset abdominal pain around 9 AM this morning.  Patient with history of bowel obstruction and states this feels similarly.  Reports feelings of nausea without emesis.  Per caregiver, patient felt tactilely warm but did not check for fever.  Describes abdominal pain as diffuse in nature without radiation.  Denies urinary symptoms, change in bowel habits.  Past medical history significant for small bowel obstruction, intractable nausea and vomiting, urinary retention, atherosclerosis of abdominal aorta, chronic pain syndrome, lumbosacral radiculopathy  Home Medications Prior to Admission medications   Medication Sig Start Date End Date Taking? Authorizing Provider  acetaminophen (TYLENOL) 500 MG tablet Take 1,000 mg by mouth every 6 (six) hours as needed for mild pain or moderate pain.   Yes [provider]  ascorbic acid (VITAMIN C) 500 MG tablet Take 500 mg by mouth daily.   Yes [provider]  cholecalciferol (VITAMIN D3) 25 MCG (1000 UNIT) tablet Take 2 tablets (2,000 Units total) by mouth daily. 12/01/21  Yes Rai, Ripudeep K, MD  cyanocobalamin (VITAMIN B12) 500 MCG tablet Take 2 tablets (1,000 mcg total) by mouth 2 (two) times daily. 12/01/21  Yes Rai, Ripudeep K, MD  diltiazem (CARDIZEM CD) 120 MG 24 hr capsule Take 1 capsule (120 mg total) by mouth every evening. 05/17/20  Yes Medina-Vargas, Monina C, NP  docusate sodium (COLACE) 100 MG capsule Take 1 capsule (100 mg total) by mouth in the morning, at noon, and at bedtime. Patient taking differently: Take 300 mg by mouth at bedtime. 05/17/20  Yes Medina-Vargas, Monina C, NP   enalapril (VASOTEC) 5 MG tablet Take 5 mg by mouth 2 (two) times daily. 01/19/22  Yes [provider]  mirabegron ER (MYRBETRIQ) 50 MG TB24 tablet Take 1 tablet (50 mg total) by mouth daily. 05/17/20  Yes Medina-Vargas, Monina C, NP  NON FORMULARY Pt uses a cpap at night   Yes [provider]  ondansetron (ZOFRAN) 4 MG tablet Take 1 tablet (4 mg total) by mouth every 8 (eight) hours as needed for nausea or vomiting. 12/01/21  Yes Rai, Ripudeep K, MD  oxyCODONE (ROXICODONE) 15 MG immediate release tablet Take 1 tablet (15 mg total) by mouth every 6 (six) hours as needed for pain. 05/17/20  Yes Medina-Vargas, Monina C, NP  polyethylene glycol (MIRALAX / GLYCOLAX) 17 g packet Take 17 g by mouth 2 (two) times daily. Patient taking differently: Take 17 g by mouth daily as needed for mild constipation or moderate constipation. 05/17/20  Yes Medina-Vargas, Monina C, NP  rOPINIRole (REQUIP) 0.5 MG tablet Take 0.5 mg by mouth at bedtime as needed (restless legs). 09/22/21  Yes [provider]  solifenacin (VESICARE) 5 MG tablet Take 5 mg by mouth daily. 07/17/21  Yes [provider]  sulfamethoxazole-trimethoprim (BACTRIM) 400-80 MG tablet Take 1 tablet by mouth at bedtime. 12/18/21  Yes [provider]  tamsulosin (FLOMAX) 0.4 MG CAPS capsule Take 1 capsule (0.4 mg total) by mouth every evening. 05/17/20  Yes Medina-Vargas, Monina C, NP  venlafaxine XR (EFFEXOR-XR) 75 MG 24 hr capsule Take 1 capsule (75 mg total) by mouth in the  morning and at bedtime. 05/17/20  Yes Medina-Vargas, Monina C, NP  methylPREDNISolone (MEDROL DOSEPAK) 4 MG TBPK tablet Take by mouth. 06/17/22   [provider]      Allergies    Erythromycin    Review of Systems   Review of Systems  All other systems reviewed and are negative.   Physical Exam Updated Vital Signs BP 128/68 (BP Location: Right Arm)   Pulse 82   Temp 98.8 F (37.1 C) (Oral)   Resp 18   Ht 5\' 8"  (1.727 m)   Wt 91.1  kg   SpO2 100%   BMI 30.54 kg/m  Physical Exam Vitals and nursing note reviewed.  Constitutional:      General: He is not in acute distress.    Appearance: He is well-developed.  HENT:     Head: Normocephalic and atraumatic.  Eyes:     Conjunctiva/sclera: Conjunctivae normal.  Cardiovascular:     Rate and Rhythm: Normal rate and regular rhythm.     Heart sounds: No murmur heard. Pulmonary:     Effort: Pulmonary effort is normal. No respiratory distress.     Breath sounds: Normal breath sounds.  Abdominal:     Palpations: Abdomen is soft.     Tenderness: There is generalized abdominal tenderness. There is no right CVA tenderness or left CVA tenderness.     Comments: Patient with diffuse abdominal tenderness.  Distention noted in suprapubic region.  Musculoskeletal:        General: No swelling.     Cervical back: Neck supple.  Skin:    General: Skin is warm and dry.     Capillary Refill: Capillary refill takes less than 2 seconds.  Neurological:     Mental Status: He is alert.  Psychiatric:        Mood and Affect: Mood normal.     ED Results / Procedures / Treatments   Labs (all labs ordered are listed, but only abnormal results are displayed) Labs Reviewed  COMPREHENSIVE METABOLIC PANEL - Abnormal; Notable for the following components:      Result Value   CO2 16 (*)    Glucose, Bld 111 (*)    BUN 26 (*)    Creatinine, Ser 1.31 (*)    GFR, Estimated 59 (*)    All other components within normal limits  LACTIC ACID, PLASMA - Abnormal; Notable for the following components:   Lactic Acid, Venous 5.5 (*)    All other components within normal limits  LACTIC ACID, PLASMA - Abnormal; Notable for the following components:   Lactic Acid, Venous 3.3 (*)    All other components within normal limits  URINALYSIS, ROUTINE W REFLEX MICROSCOPIC - Abnormal; Notable for the following components:   APPearance HAZY (*)    Hgb urine dipstick LARGE (*)    Protein, ur 30 (*)     Leukocytes,Ua MODERATE (*)    Bacteria, UA RARE (*)    All other components within normal limits  BASIC METABOLIC PANEL - Abnormal; Notable for the following components:   Creatinine, Ser 1.27 (*)    All other components within normal limits  CBC - Abnormal; Notable for the following components:   RBC 4.13 (*)    Hemoglobin 12.5 (*)    All other components within normal limits  CULTURE, BLOOD (ROUTINE X 2)  CULTURE, BLOOD (ROUTINE X 2)  URINE CULTURE  CBC WITH DIFFERENTIAL/PLATELET  LIPASE, BLOOD  LACTIC ACID, PLASMA  CK    EKG None  Radiology CT ABDOMEN PELVIS W CONTRAST  Result Date: 06/17/2022 CLINICAL DATA:  Sudden onset abdominal pain. Bowel obstruction suspected. EXAM: CT ABDOMEN AND PELVIS WITH CONTRAST TECHNIQUE: Multidetector CT imaging of the abdomen and pelvis was performed using the standard protocol following bolus administration of intravenous contrast. RADIATION DOSE REDUCTION: This exam was performed according to the departmental dose-optimization program which includes automated exposure control, adjustment of the mA and/or kV according to patient size and/or use of iterative reconstruction technique. CONTRAST:  OMNIPAQUE IOHEXOL 300 MG/ML  SOLN COMPARISON:  01/12/2022 FINDINGS: Lower chest: Similar appearance of chronic atelectasis or scarring in the inferior left posterior lung base. Hepatobiliary: No suspicious focal abnormality within the liver parenchyma. There is no evidence for gallstones, gallbladder wall thickening, or pericholecystic fluid. No intrahepatic or extrahepatic biliary dilation. Pancreas: No focal mass lesion. No dilatation of the main duct. No intraparenchymal cyst. No peripancreatic edema. Spleen: No splenomegaly. No focal mass lesion. Adrenals/Urinary Tract: No adrenal nodule or mass. 7 x 6 mm stone in the lower pole right kidney is similar to prior. 12.4 cm simple cyst noted in the interpolar right kidney, only minimally increased from 12.0 cm  previously. No followup imaging is recommended. There is right-sided mild to moderate hydroureteronephrosis with dilated right ureter extending down to the level of the right UVJ. No obstructing stone or mass lesion evident by CT imaging. Mild fullness noted left intrarenal collecting system and ureter with ureteral distension extending to the level of the left UVJ. No obstructing stone or mass lesion visible on the left. No substantial change 3 cm exophytic interpolar left renal cyst with simple imaging features. No followup imaging is recommended.Foley catheter is identified in the urinary bladder although the bladder is not decompressed. Stomach/Bowel: Stomach is unremarkable. No gastric wall thickening. No evidence of outlet obstruction. Duodenum is normally positioned as is the ligament of Treitz. No small bowel wall thickening. No small bowel dilatation. The terminal ileum is normal. No gross colonic mass. No colonic wall thickening. Vascular/Lymphatic: There is moderate atherosclerotic calcification of the abdominal aorta without aneurysm. There is no gastrohepatic or hepatoduodenal ligament lymphadenopathy. No retroperitoneal or mesenteric lymphadenopathy. No pelvic sidewall lymphadenopathy. Reproductive: Prostate gland is enlarged. Other: No intraperitoneal free fluid. Musculoskeletal: No worrisome lytic or sclerotic osseous abnormality. Superior endplate compression deformity at T12 is stable in the interval IMPRESSION: 1. Bilateral mild to moderate hydroureteronephrosis with dilated ureters extending down to the UVJ level on both sides. No obstructing stone or mass lesion evident by CT imaging. Urinary bladder is moderately distended despite the presence of a Foley catheter. 2. 7 x 6 mm stone in the lower pole right kidney, similar to prior. 3. Bilateral renal cysts. 4. Marked Prostatomegaly. 5.  Aortic Atherosclerosis (ICD10-I70.0). Electronically Signed   By: Kennith Center M.D.   On: 06/17/2022 14:54     Procedures .Critical Care  Performed by: Peter Garter, PA Authorized by: Peter Garter, PA   Critical care provider statement:    Critical care time (minutes):  79   Critical care was necessary to treat or prevent imminent or life-threatening deterioration of the following conditions:  Sepsis   Critical care was time spent personally by me on the following activities:  Development of treatment plan with patient or surrogate, discussions with consultants, evaluation of patient's response to treatment, examination of patient, ordering and review of laboratory studies, ordering and review of radiographic studies, ordering and performing treatments and interventions, pulse oximetry, re-evaluation of patient's condition and review  of old charts   I assumed direction of critical care for this patient from another provider in my specialty: no     Care discussed with: admitting provider       Medications Ordered in ED Medications  meropenem (MERREM) 1 g in sodium chloride 0.9 % 100 mL IVPB (1 g Intravenous New Bag/Given 06/18/22 0626)  oxyCODONE (Oxy IR/ROXICODONE) immediate release tablet 15 mg (15 mg Oral Given 06/18/22 0623)  venlafaxine XR (EFFEXOR-XR) 24 hr capsule 75 mg (75 mg Oral Given 06/18/22 0814)  mirabegron ER (MYRBETRIQ) tablet 50 mg (50 mg Oral Given 06/18/22 0824)  fesoterodine (TOVIAZ) tablet 4 mg (has no administration in time range)  tamsulosin (FLOMAX) capsule 0.4 mg (0.4 mg Oral Given 06/17/22 2258)  cyanocobalamin (VITAMIN B12) tablet 1,000 mcg (1,000 mcg Oral Given 06/18/22 0813)  rOPINIRole (REQUIP) tablet 0.5 mg (has no administration in time range)  cholecalciferol (VITAMIN D3) 25 MCG (1000 UNIT) tablet 2,000 Units (2,000 Units Oral Given 06/18/22 0813)  heparin injection 5,000 Units (has no administration in time range)  acetaminophen (TYLENOL) tablet 650 mg (has no administration in time range)    Or  acetaminophen (TYLENOL) suppository 650 mg (has no administration in  time range)  polyethylene glycol (MIRALAX / GLYCOLAX) packet 17 g (has no administration in time range)  hydrALAZINE (APRESOLINE) injection 5 mg (has no administration in time range)  diltiazem (CARDIZEM CD) 24 hr capsule 120 mg (has no administration in time range)  melatonin tablet 6 mg (6 mg Oral Given 06/18/22 0051)  Chlorhexidine Gluconate Cloth 2 % PADS 6 each (has no administration in time range)  ondansetron (ZOFRAN) injection 4 mg (4 mg Intravenous Given 06/17/22 1343)  HYDROmorphone (DILAUDID) injection 1 mg (1 mg Intravenous Given 06/17/22 1343)  sodium chloride 0.9 % bolus 500 mL (0 mLs Intravenous Stopped 06/17/22 1503)  iohexol (OMNIPAQUE) 300 MG/ML solution 100 mL (100 mLs Intravenous Contrast Given 06/17/22 1412)  sodium chloride 0.9 % bolus 1,000 mL (0 mLs Intravenous Stopped 06/17/22 1812)  HYDROmorphone (DILAUDID) injection 1 mg (1 mg Intravenous Given 06/17/22 1809)  acetaminophen (TYLENOL) tablet 650 mg (650 mg Oral Given 06/17/22 1854)  oxyCODONE (Oxy IR/ROXICODONE) immediate release tablet 10 mg (10 mg Oral Given 06/18/22 0051)    ED Course/ Medical Decision Making/ A&P Clinical Course as of 06/18/22 1102  Wed Jun 17, 2022  1829 Consulted pharmacy regarding the patient given his significantly resistant previous urine culture.  Recommendation was ciprofloxacin and meropenem for intravenous antibiotics. [CR]  1846 Consulted hospitalist Dr. Randol Kern regarding the patient who agreed with admission and assume further treatment/care [CR]    Clinical Course User Index [CR] Peter Garter, PA                             Medical Decision Making Amount and/or Complexity of Data Reviewed Labs: ordered. Radiology: ordered.  Risk OTC drugs. Prescription drug management. Decision regarding hospitalization.   This patient presents to the ED for concern of abdominal pain, this involves an extensive number of treatment options, and is a complaint that carries with it a high risk of  complications and morbidity.  The differential diagnosis includes gastritis, pancreatitis, CBD pathology, cholecystitis, SBO/LBO, volvulus, diverticulitis, appendicitis, pyelonephritis, nephrolithiasis, cystitis, urinary retention, AAA, aortic dissection   Co morbidities that complicate the patient evaluation  See HPI   Additional history obtained:  Additional history obtained from EMR External records from outside source obtained and reviewed including  hospital records   Lab Tests:  I Ordered, and personally interpreted labs.  The pertinent results include: No leukocytosis.  No evidence of anemia.  Platelets within range.  Mild decrease in bicarb of 16 but otherwise, electrolytes within normal limits.  Patient with baseline renal dysfunction with creatinine elevated of 1.31, BUN 26 and GFR 59.  No transaminitis.  UA significant for rare bacteria, greater than 50 WBCs with moderate leukocytes; this was obtained after catheter was exchanged.  Lipase within normal limits.  Blood cultures and urine culture pending.  Initial lactic elevated at 5.55 which when trended after intravenous fluid was subsequently 3 and finally 1.9   Imaging Studies ordered:  I ordered imaging studies including CT abdomen pelvis I independently visualized and interpreted imaging which showed bilateral mild to moderate hydroureteronephrosis and dilated ureters extending down to UVJ on both sides.  No obvious obstruction.  7 x 6 mm stone in lower pole right kidney.  Bilateral renal cyst.  Marked prostatic megaly.  Aortic atherosclerosis. I agree with the radiologist interpretation  Cardiac Monitoring: / EKG:  The patient was maintained on a cardiac monitor.  I personally viewed and interpreted the cardiac monitored which showed an underlying rhythm of: Sinus tachycardia with right bundle branch.  Similar appearance to prior EKGs performed   Consultations Obtained:  See ED course  Problem List / ED Course /  Critical interventions / Medication management  SIRS criteria, urinary tract infection, elevated lactic acid I ordered medication including Dilaudid, meropenem, ciprofloxacin, 1500 cc of normal saline, Zofran   Reevaluation of the patient after these medicines showed that the patient improved I have reviewed the patients home medicines and have made adjustments as needed   Social Determinants of Health:  Former tobacco use.  Denies illicit drug use.  Test / Admission - Considered:  Urinary tract infection, SIRS criteria, elevated lactic acid Vitals signs significant for initially with tachycardia as well as tachypnea most likely secondary to abdominal discomfort.  When Foley catheter was placed and urine was drained, patient's vital signs were returned within normal limits.  Upon discharge, patient with noted elevation in temperature of 100.7.Marland Kitchen Otherwise within normal range and stable throughout visit. Laboratory/imaging studies significant for: See above 72 year old male presents emergency department with complaints of abdominal pain.  Patient found with acute onset urinary retention secondary to Foley catheter obstruction.  Foley catheter was replaced with subsequent resolution of patient's symptoms.  Patient with initial lactic acid elevated at 5.55 which when trended after intravenous fluids were given, returned with normal range.  Was discussing discharge with patient but patient developed a fever, return of tachycardia.  Given evidence of urinary tract infection, fever, tachycardia, elevated lactic acid as well as significantly resistant prior urine culture requiring treatment with intravenous antibiotics, admission deemed necessary.  Patient with meeting of SIRS criteria with evidence of infectious source being urine.  Antibiotics were gone in the form of meropenem as well as ciprofloxacin given resistant culture per pharmacy recommendation.  Given patient's current condition, admission  deemed most necessary. Treatment plan were discussed at length with patient and they knowledge understanding was agreeable to said plan.  Appropriate consultations were made as described in the ED course.  Patient was stable upon admission to the hospital.         Final Clinical Impression(s) / ED Diagnoses Final diagnoses:  Acute cystitis with hematuria  Elevated lactic acid level  Sepsis, due to unspecified organism, unspecified whether acute organ dysfunction present (HCC)  Rx / DC Orders ED Discharge Orders     None         Peter Garter, [place] 06/18/22 1102    Pricilla Loveless, MD 06/19/22 [phone]

## 2022-06-17 NOTE — ED Triage Notes (Signed)
Pt BIB [facility] for c/o abdominal pain; pt states the pain was sudden and denies any n/v/d  Pt states he took 2 of his oxycodone 10mg   this am with no relief  Pt states he has taken miralax and an enema with no significant results

## 2022-06-18 DIAGNOSIS — R6521 Severe sepsis with septic shock: Secondary | ICD-10-CM

## 2022-06-18 DIAGNOSIS — A419 Sepsis, unspecified organism: Secondary | ICD-10-CM | POA: Diagnosis not present

## 2022-06-18 DIAGNOSIS — N390 Urinary tract infection, site not specified: Secondary | ICD-10-CM | POA: Diagnosis not present

## 2022-06-18 DIAGNOSIS — N1330 Unspecified hydronephrosis: Secondary | ICD-10-CM | POA: Diagnosis not present

## 2022-06-18 DIAGNOSIS — R338 Other retention of urine: Secondary | ICD-10-CM | POA: Diagnosis not present

## 2022-06-18 LAB — CBC
HCT: 39.9 % (ref 39.0–52.0)
Hemoglobin: 12.5 g/dL — ABNORMAL LOW (ref 13.0–17.0)
MCH: 30.3 pg (ref 26.0–34.0)
MCHC: 31.3 g/dL (ref 30.0–36.0)
MCV: 96.6 fL (ref 80.0–100.0)
Platelets: 251 10*3/uL (ref 150–400)
RBC: 4.13 MIL/uL — ABNORMAL LOW (ref 4.22–5.81)
RDW: 13.9 % (ref 11.5–15.5)
WBC: 8.7 10*3/uL (ref 4.0–10.5)
nRBC: 0 % (ref 0.0–0.2)

## 2022-06-18 LAB — BASIC METABOLIC PANEL
Anion gap: 8 (ref 5–15)
BUN: 22 mg/dL (ref 8–23)
CO2: 23 mmol/L (ref 22–32)
Calcium: 9.3 mg/dL (ref 8.9–10.3)
Chloride: 107 mmol/L (ref 98–111)
Creatinine, Ser: 1.27 mg/dL — ABNORMAL HIGH (ref 0.61–1.24)
GFR, Estimated: >60 mL/min (ref >60)
Glucose, Bld: 97 mg/dL (ref 70–99)
Potassium: 4.4 mmol/L (ref 3.5–5.1)
Sodium: 138 mmol/L (ref 135–145)

## 2022-06-18 LAB — URINE CULTURE

## 2022-06-18 LAB — CULTURE, BLOOD (ROUTINE X 2)

## 2022-06-18 MED ORDER — OXYCODONE HCL 5 MG PO TABS
15.0000 mg | ORAL_TABLET | ORAL | Status: DC | PRN
  Administered 2022-06-18 – 2022-06-19 (×6): 15 mg via ORAL
  Filled 2022-06-18 (×7): qty 3

## 2022-06-18 MED ORDER — MELATONIN 3 MG PO TABS
6.0000 mg | ORAL_TABLET | Freq: Every day | ORAL | Status: DC
  Administered 2022-06-18 (×2): 6 mg via ORAL
  Filled 2022-06-18 (×2): qty 2

## 2022-06-18 MED ORDER — CHLORHEXIDINE GLUCONATE CLOTH 2 % EX PADS
6.0000 | MEDICATED_PAD | Freq: Every day | CUTANEOUS | Status: DC
  Administered 2022-06-18 – 2022-06-19 (×2): 6 via TOPICAL

## 2022-06-18 NOTE — Plan of Care (Signed)
  Problem: Acute Rehab PT Goals(only PT should resolve) Goal: Pt Will Go Supine/Side To Sit Outcome: Progressing Flowsheets (Taken 06/18/2022 1543) Pt will go Supine/Side to Sit:  Independently  with modified independence Goal: Patient Will Transfer Sit To/From Stand Outcome: Progressing Flowsheets (Taken 06/18/2022 1543) Patient will transfer sit to/from stand:  with supervision  with min guard assist Goal: Pt Will Transfer Bed To Chair/Chair To Bed Outcome: Progressing Flowsheets (Taken 06/18/2022 1543) Pt will Transfer Bed to Chair/Chair to Bed:  with supervision  min guard assist Goal: Pt Will Ambulate Outcome: Progressing Flowsheets (Taken 06/18/2022 1543) Pt will Ambulate:  50 feet  with min guard assist  with supervision  with rolling walker   3:43 PM, 06/18/22 Ocie Bob, MPT Physical Therapist with [facility] [phone] office [phone] mobile phone

## 2022-06-18 NOTE — Progress Notes (Signed)
[facility] Progress Note  Patient: Luke Foster    ZOX:[id number]  DOA: 06/17/2022    Date of Service: the patient was seen and examined on 06/18/2022  Brief hospital course: Patient is a 72 year old male with past medical history of chronic neck and back pain status post spinal cord stimulator placement, previous small bowel obstruction, neurogenic bladder with Foley catheter and obesity who presented to the emergency room on 5/8 with complaints of abdominal pain and nausea that started this morning.  CT scan of abdomen pelvis noted bilateral hydronephrosis and distended bladder despite Foley catheter present.  Catheter was exchanged and patient then voided 2000 cc.  Found to be with septic shock.  Patient started on meropenem and admitted to hospitalist service.   Assessment and Plan: Septic shock, present on admission secondary to urinary tract infection can [place] to urinary retention from nonfunctioning Foley: Present on admission.  Urine cultures pending.  Met criteria for septic shock on admission given temperature greater than 100.5, tachycardia, tachypnea and lactic acid level initially at 5.5.  With aggressive fluid resuscitation, lactic acid level trending downward.  AKI: Creatinine on admission at 1.31.  GFR normally greater than 60 and at 59.  IV fluids, GFR improved although creatinine still elevated this morning.  Continue IV fluids.  History of neurogenic bladder: Has Foley catheter as above.  Continue Flomax and Vesicare  History of chronic neck and back pain status post spinal cord stimulator placement: Continue home pain medication  Obesity: Meets criteria BMI greater than 30  Depression: Continue Effexor  Hypertension: Continue Cardizem, holding Vasotec for now  Body mass index is 30.54 kg/m.        Consultants: None  Procedures: Exchange of Foley catheter in emergency room  Antimicrobials: IV Cipro 5/8 - 5/9 IV meropenem 5/8-present  Code Status: Full  code   Subjective: Feeling a bit better  Objective: Vital signs were reviewed and unremarkable. Vitals:   06/18/22 0147 06/18/22 0520  BP: 107/66 128/68  Pulse: 85 82  Resp: 16 18  Temp: 99.2 F (37.3 C) 98.8 F (37.1 C)  SpO2: 100% 100%    Intake/Output Summary (Last 24 hours) at 06/18/2022 1104 Last data filed at 06/18/2022 0700 Gross per 24 hour  Intake 2098.33 ml  Output 2400 ml  Net -301.67 ml   Filed Weights   06/17/22 1325 06/17/22 2215  Weight: 90.7 kg 91.1 kg   Body mass index is 30.54 kg/m.  Exam:  General: Alert and oriented x 3, no acute distress HEENT: Normocephalic atraumatic, mucous membranes are moist Cardiovascular: Regular rate and rhythm, S1-S2 Respiratory: To auscultation bilaterally Abdomen: Soft, nontender, nondistended, positive bowel sounds Musculoskeletal: No clubbing or cyanosis or edema Skin: No skin breaks, tears or lesions Psychiatry: Appropriate, no evidence of psychoses  Data Reviewed: Creatinine down to 1.27  Disposition:  Status is: Inpatient Remains inpatient appropriate because:  -Urine sensitivities    Anticipated discharge date: 5/10  Family Communication: Updated brother at bedside DVT Prophylaxis: heparin injection 5,000 Units Start: 06/18/22 1400    Author: Hollice Espy ,MD 06/18/2022 11:04 AM  To reach On-call, see care teams to locate the attending and reach out via [email or web address]. Between 7PM-7AM, please contact night-coverage If you still have difficulty reaching the attending provider, please page the [facility] (Director on Call) for [facility] on amion for assistance.

## 2022-06-18 NOTE — Progress Notes (Signed)
  Transition of Care [facility]) Screening Note   Patient Details  Name: Luke Foster Date of Birth: August 02, 1950   Transition of Care [facility]) CM/SW Contact:    Annice Needy, LCSW Phone Number: 06/18/2022, 10:23 AM    Transition of Care Department [facility]) has reviewed patient and no TOC needs have been identified at this time. We will continue to monitor patient advancement through interdisciplinary progression rounds. If new patient transition needs arise, please place a TOC consult.

## 2022-06-18 NOTE — Evaluation (Signed)
Occupational Therapy Evaluation Patient Details Name: Luke Foster MRN: [id number] DOB: 15-Nov-1950 Today's Date: 06/18/2022   History of Present Illness Luke Foster  is a 72 y.o. male,  with history of chronic back pain, neck pain status post spinal cord stimulator placement, lumbosacral radiculopathy, nephrolithiasis, bilateral hydronephrosis, hyperlipidemia, neurogenic bladder with Foley catheter .  -Patient presents to ED secondary to complaints of abdominal pain, nausea, patient reports acute onset of abdominal pain this morning, he is with history of small bowel obstruction in the past which prompted him to come to ED as states it feels similar, he reports nausea, with no vomiting, he denies any constipation or obstipation, ports feeling warm, but did not check for fever. (Per MD)   Clinical Impression   Pt agreeable to OT and PT co-evaluation. Pt reports household ambulation and independent ADL's at baseline. Today pt required min A for ambulation and transfers. Pt demonstrates WFL B UE strength and range of motion. Pt reports ability to have supervised mobility for a time at home. Pt was left in the chair with call bell within reach.      Recommendations for follow up therapy are one component of a multi-disciplinary discharge planning process, led by the attending physician.  Recommendations may be updated based on patient status, additional functional criteria and insurance authorization.   Assistance Recommended at Discharge Intermittent Supervision/Assistance  Patient can return home with the following A little help with walking and/or transfers;Assist for transportation;Help with stairs or ramp for entrance;Assistance with cooking/housework;A little help with bathing/dressing/bathroom    Functional Status Assessment  Patient has had a recent decline in their functional status and demonstrates the ability to make significant improvements in function in a reasonable and predictable  amount of time.  Equipment Recommendations  None recommended by OT           Precautions / Restrictions Precautions Precautions: Fall Restrictions Weight Bearing Restrictions: No      Mobility Bed Mobility Overal bed mobility: Needs Assistance Bed Mobility: Supine to Sit     Supine to sit: Modified independent (Device/Increase time), HOB elevated          Transfers Overall transfer level: Needs assistance Equipment used: Rolling walker (2 wheels) Transfers: Sit to/from Stand, Bed to chair/wheelchair/BSC Sit to Stand: Min guard, Min assist     Step pivot transfers: Min guard, Min assist     General transfer comment: Extended time and a few efforts to boost form EOB. Labored effort and time to boost form chair. Unsteady and labored movement with RW. Ambulation did improve with repetition of steps.      Balance Overall balance assessment: Needs assistance Sitting-balance support: No upper extremity supported, Feet supported Sitting balance-Leahy Scale: Good Sitting balance - Comments: seated at EOB   Standing balance support: During functional activity, Reliant on assistive device for balance, Bilateral upper extremity supported Standing balance-Leahy Scale: Poor Standing balance comment: poor to fair with RW                           ADL either performed or assessed with clinical judgement   ADL Overall ADL's : Needs assistance/impaired     Grooming: Set up;Sitting   Upper Body Bathing: Set up;Sitting   Lower Body Bathing: Minimal assistance;Moderate assistance;Sitting/lateral leans   Upper Body Dressing : Set up;Sitting   Lower Body Dressing: Minimal assistance;Moderate assistance;Sitting/lateral leans   Toilet Transfer: Min guard;Minimal assistance;Stand-pivot;Ambulation;Rolling walker (2 wheels) Toilet Transfer Details (indicate  cue type and reason): Simulated via EOB to chair transfer. Toileting- Clothing Manipulation and Hygiene: Min  guard;Sitting/lateral lean;Set up       Functional mobility during ADLs: Minimal assistance;Min guard;Rolling walker (2 wheels) General ADL Comments: Pt able to ambulate to door and back within the room using RW. Pt does not wear socks or shoes at baseline. Reports using lateral leans for donning pants.     Vision Baseline Vision/History: 1 Wears glasses Ability to See in Adequate Light: 1 Impaired Patient Visual Report: No change from baseline Vision Assessment?: No apparent visual deficits                Pertinent Vitals/Pain Pain Assessment Pain Assessment: 0-10 Pain Score: 9  Pain Location: low back, bilateral feet Pain Descriptors / Indicators: Other (Comment) (just hurts) Pain Intervention(s): Limited activity within patient's tolerance, Monitored during session, Repositioned     Hand Dominance Right   Extremity/Trunk Assessment Upper Extremity Assessment Upper Extremity Assessment: Overall WFL for tasks assessed   Lower Extremity Assessment Lower Extremity Assessment: Defer to PT evaluation   Cervical / Trunk Assessment Cervical / Trunk Assessment: Kyphotic   Communication Communication Communication: No difficulties   Cognition Arousal/Alertness: Awake/alert Behavior During Therapy: WFL for tasks assessed/performed Overall Cognitive Status: Within Functional Limits for tasks assessed                                                        Home Living Family/patient expects to be discharged to:: Private residence Living Arrangements: Alone Available Help at Discharge: Personal care attendant (8 hours a day) Type of Home: House Home Access: Ramped entrance     Home Layout: One level     Bathroom Shower/Tub: Producer, television/film/video: Handicapped height Bathroom Accessibility: Yes   Home Equipment: Shower seat - built Charity fundraiser (2 wheels);Cane - single point;Hand held shower head;BSC/3in1          Prior  Functioning/Environment Prior Level of Function : Needs assist       Physical Assist : ADLs (physical)   ADLs (physical): IADLs Mobility Comments: uses RW ADLs Comments: patient states independent with basic ADL, aid assists with cooking/cleaning        OT Problem List: Decreased strength;Decreased activity tolerance;Impaired balance (sitting and/or standing);Pain      OT Treatment/Interventions: Self-care/ADL training;Therapeutic exercise;Therapeutic activities;Patient/family education;Balance training    OT Goals(Current goals can be found in the care plan section) Acute Rehab OT Goals Patient Stated Goal: return home OT Goal Formulation: With patient Time For Goal Achievement: 07/02/22 Potential to Achieve Goals: Good  OT Frequency: Min 2X/week    Co-evaluation PT/OT/SLP Co-Evaluation/Treatment: Yes Reason for Co-Treatment: To address functional/ADL transfers   OT goals addressed during session: ADL's and self-care                       End of Session Equipment Utilized During Treatment: Rolling walker (2 wheels) Nurse Communication: Mobility status  Activity Tolerance: Patient tolerated treatment well Patient left: in chair;with call bell/phone within reach  OT Visit Diagnosis: Unsteadiness on feet (R26.81);Other abnormalities of gait and mobility (R26.89);Muscle weakness (generalized) (M62.81)                Time: 1337-1350 OT Time Calculation (min): 13 min Charges:  OT General Charges $OT  Visit: 1 Visit OT Evaluation $OT Eval Low Complexity: 1 Low  Overton Boggus OT, MOT   Danie Chandler 06/18/2022, 2:59 PM

## 2022-06-18 NOTE — Evaluation (Signed)
Physical Therapy Evaluation Patient Details Name: Luke Foster MRN: [id number] DOB: 16-Jun-1950 Today's Date: 06/18/2022  History of Present Illness  Luke Foster  is a 72 y.o. male,  with history of chronic back pain, neck pain status post spinal cord stimulator placement, lumbosacral radiculopathy, nephrolithiasis, bilateral hydronephrosis, hyperlipidemia, neurogenic bladder with Foley catheter .  -Patient presents to ED secondary to complaints of abdominal pain, nausea, patient reports acute onset of abdominal pain this morning, he is with history of small bowel obstruction in the past which prompted him to come to ED as states it feels similar, he reports nausea, with no vomiting, he denies any constipation or obstipation, ports feeling warm, but did not check for fever.   Clinical Impression  Patient functioning near baseline for functional mobility and gait other than having difficulty completing sit to stands due to BLE weakness, once on feet demonstrates fair/good return for ambulating in room without loss of balance and requested not to wear socks or shoes due to causing nerve pain in feet.  Patient tolerated sitting up in chair after therapy - nursing staff notified.  Patient will benefit from continued skilled physical therapy in hospital and recommended venue below to increase strength, balance, endurance for safe ADLs and gait.          Recommendations for follow up therapy are one component of a multi-disciplinary discharge planning process, led by the attending physician.  Recommendations may be updated based on patient status, additional functional criteria and insurance authorization.  Follow Up Recommendations       Assistance Recommended at Discharge Set up Supervision/Assistance  Patient can return home with the following  A little help with walking and/or transfers;A little help with bathing/dressing/bathroom;Help with stairs or ramp for entrance;Assistance with  cooking/housework    Equipment Recommendations None recommended by PT  Recommendations for Other Services       Functional Status Assessment Patient has had a recent decline in their functional status and demonstrates the ability to make significant improvements in function in a reasonable and predictable amount of time.     Precautions / Restrictions Precautions Precautions: Fall Restrictions Weight Bearing Restrictions: No      Mobility  Bed Mobility Overal bed mobility: Needs Assistance Bed Mobility: Supine to Sit     Supine to sit: Supervision, Modified independent (Device/Increase time)     General bed mobility comments: slightly labored movement with HOB flat    Transfers Overall transfer level: Needs assistance Equipment used: Rolling walker (2 wheels) Transfers: Sit to/from Stand, Bed to chair/wheelchair/BSC Sit to Stand: Min guard, Min assist   Step pivot transfers: Min guard, Min assist       General transfer comment: increased time, labored movement    Ambulation/Gait Ambulation/Gait assistance: Min assist, Min guard Gait Distance (Feet): 25 Feet Assistive device: Rolling walker (2 wheels) Gait Pattern/deviations: Decreased step length - left, Decreased stance time - right, Decreased stride length, Trunk flexed Gait velocity: decreased     General Gait Details: fair/good return for ambulating in room with flexed trunk, slightly labored movement without loss of balance  Stairs            Wheelchair Mobility    Modified Rankin (Stroke Patients Only)       Balance Overall balance assessment: Needs assistance Sitting-balance support: Feet supported, No upper extremity supported Sitting balance-Leahy Scale: Good Sitting balance - Comments: seated at EOB   Standing balance support: During functional activity, Reliant on assistive device for balance, Bilateral upper  extremity supported Standing balance-Leahy Scale: Poor Standing balance  comment: fair/poor using RW                             Pertinent Vitals/Pain Pain Assessment Pain Assessment: 0-10 Pain Score: 9  Pain Location: low back, bilateral feet Pain Descriptors / Indicators: Sore, Dull (nerve pain in feet) Pain Intervention(s): Limited activity within patient's tolerance, Monitored during session, Repositioned    Home Living Family/patient expects to be discharged to:: Private residence Living Arrangements: Alone Available Help at Discharge: Personal care attendant Type of Home: House Home Access: Ramped entrance       Home Layout: One level Home Equipment: Shower seat - built Charity fundraiser (2 wheels);Cane - single point;Hand held shower head;BSC/3in1      Prior Function Prior Level of Function : Needs assist       Physical Assist : ADLs (physical);Mobility (physical) Mobility (physical): Bed mobility;Transfers;Gait;Stairs ADLs (physical): IADLs Mobility Comments: household ambulator using RW, unable to wear socks/shoes due to causing nerve pain in feet ADLs Comments: patient states independent with basic ADL, aid assists with cooking/cleaning     Hand Dominance   Dominant Hand: Right    Extremity/Trunk Assessment   Upper Extremity Assessment Upper Extremity Assessment: Defer to OT evaluation    Lower Extremity Assessment Lower Extremity Assessment: Generalized weakness    Cervical / Trunk Assessment Cervical / Trunk Assessment: Kyphotic  Communication   Communication: No difficulties  Cognition Arousal/Alertness: Awake/alert Behavior During Therapy: WFL for tasks assessed/performed Overall Cognitive Status: Within Functional Limits for tasks assessed                                          General Comments      Exercises     Assessment/Plan    PT Assessment Patient needs continued PT services  PT Problem List Decreased strength;Decreased activity tolerance;Decreased balance;Decreased  mobility       PT Treatment Interventions DME instruction;Gait training;Stair training;Functional mobility training;Therapeutic activities;Therapeutic exercise;Patient/family education;Balance training    PT Goals (Current goals can be found in the Care Plan section)  Acute Rehab PT Goals Patient Stated Goal: return home with home aides to assist PT Goal Formulation: With patient Time For Goal Achievement: 06/22/22 Potential to Achieve Goals: Good    Frequency Min 3X/week     Co-evaluation PT/OT/SLP Co-Evaluation/Treatment: Yes Reason for Co-Treatment: To address functional/ADL transfers PT goals addressed during session: Mobility/safety with mobility;Balance;Proper use of DME OT goals addressed during session: ADL's and self-care       AM-PAC PT "6 Clicks" Mobility  Outcome Measure Help needed turning from your back to your side while in a flat bed without using bedrails?: None Help needed moving from lying on your back to sitting on the side of a flat bed without using bedrails?: A Little Help needed moving to and from a bed to a chair (including a wheelchair)?: A Little Help needed standing up from a chair using your arms (e.g., wheelchair or bedside chair)?: A Little Help needed to walk in hospital room?: A Little Help needed climbing 3-5 steps with a railing? : A Lot 6 Click Score: 18    End of Session   Activity Tolerance: Patient tolerated treatment well;Patient limited by fatigue Patient left: in chair;with call bell/phone within reach Nurse Communication: Mobility status PT Visit Diagnosis: Unsteadiness on  feet (R26.81);Other abnormalities of gait and mobility (R26.89);Muscle weakness (generalized) (M62.81)    Time: 1478-2956 PT Time Calculation (min) (ACUTE ONLY): 28 min   Charges:   PT Evaluation $PT Eval Moderate Complexity: 1 Mod PT Treatments $Therapeutic Activity: 23-37 mins        3:41 PM, 06/18/22 Ocie Bob, MPT Physical Therapist with  [facility] 336 [phone] office [phone] mobile phone

## 2022-06-18 NOTE — Plan of Care (Signed)
  Problem: Acute Rehab OT Goals (only OT should resolve) Goal: Pt. Will Perform Grooming Flowsheets (Taken 06/18/2022 1511) Pt Will Perform Grooming:  with modified independence  sitting Goal: Pt. Will Perform Lower Body Bathing Flowsheets (Taken 06/18/2022 1511) Pt Will Perform Lower Body Bathing:  with modified independence  sitting/lateral leans Goal: Pt. Will Perform Lower Body Dressing Flowsheets (Taken 06/18/2022 1511) Pt Will Perform Lower Body Dressing:  with modified independence  sitting/lateral leans Goal: Pt. Will Transfer To Toilet Flowsheets (Taken 06/18/2022 1511) Pt Will Transfer to Toilet:  with modified independence  ambulating  Jannelly Bergren OT, MOT

## 2022-06-19 DIAGNOSIS — R338 Other retention of urine: Secondary | ICD-10-CM | POA: Diagnosis not present

## 2022-06-19 DIAGNOSIS — N390 Urinary tract infection, site not specified: Secondary | ICD-10-CM | POA: Diagnosis not present

## 2022-06-19 DIAGNOSIS — I10 Essential (primary) hypertension: Secondary | ICD-10-CM | POA: Diagnosis not present

## 2022-06-19 DIAGNOSIS — N1330 Unspecified hydronephrosis: Secondary | ICD-10-CM | POA: Diagnosis not present

## 2022-06-19 LAB — BASIC METABOLIC PANEL
Anion gap: 9 (ref 5–15)
BUN: 24 mg/dL — ABNORMAL HIGH (ref 8–23)
CO2: 24 mmol/L (ref 22–32)
Calcium: 9.1 mg/dL (ref 8.9–10.3)
Chloride: 105 mmol/L (ref 98–111)
Creatinine, Ser: 1.2 mg/dL (ref 0.61–1.24)
GFR, Estimated: >60 mL/min (ref >60)
Glucose, Bld: 113 mg/dL — ABNORMAL HIGH (ref 70–99)
Potassium: 3.8 mmol/L (ref 3.5–5.1)
Sodium: 138 mmol/L (ref 135–145)

## 2022-06-19 LAB — PROCALCITONIN: Procalcitonin: <0.1 ng/mL

## 2022-06-19 LAB — CULTURE, BLOOD (ROUTINE X 2): Special Requests: ADEQUATE

## 2022-06-19 MED ORDER — GABAPENTIN 300 MG PO CAPS
300.0000 mg | ORAL_CAPSULE | Freq: Once | ORAL | Status: AC
  Administered 2022-06-19: 300 mg via ORAL
  Filled 2022-06-19: qty 1

## 2022-06-19 MED ORDER — CIPROFLOXACIN HCL 500 MG PO TABS: 500.0000 mg | ORAL_TABLET | Freq: Two times a day (BID) | ORAL | 0 refills | Status: AC

## 2022-06-19 MED ORDER — CIPROFLOXACIN HCL 500 MG PO TABS: 500.0000 mg | ORAL_TABLET | Freq: Two times a day (BID) | ORAL | 0 refills | Status: DC

## 2022-06-19 NOTE — Care Management Important Message (Signed)
Important Message  Patient Details  Name: Luke Foster MRN: [id number] Date of Birth: 04-11-1950   Medicare Important Message Given:  Yes     Corey Harold 06/19/2022, 8:56 AM

## 2022-06-19 NOTE — TOC Transition Note (Signed)
Transition of Care [facility]) - CM/SW Discharge Note   Patient Details  Name: Luke Foster MRN: [id number] Date of Birth: 20-Jan-1951  [facility] CM/SW Contact:  Elliot Gault, LCSW Phone Number: 06/19/2022, 1:55 PM   Clinical Narrative:     Pt admitted from home and is medically stable for dc today per MD.  Met with pt to review dc planning. Pt lives alone and will return home at dc. Pt is able to get to appointments and obtain medications as needed. Pt was active with [facility] RN/PT prior to admission and pt would like to continue with them at dc.  Updated MD and requested HH orders. Pt states his brother is coming to pick him up.   There are no other TOC needs for dc.  Expected Discharge Plan: Home w Home Health Services Barriers to Discharge: Barriers Resolved   Patient Goals and CMS Choice Patient states their goals for this hospitalization and ongoing recovery are:: go home   Choice offered to / list presented to : Patient  Expected Discharge Plan and Services Expected Discharge Plan: Home w Home Health Services In-house Referral: Clinical Social Work   Post Acute Care Choice: Resumption of Svcs/PTA Provider Living arrangements for the past 2 months: Single Family Home Expected Discharge Date: 06/19/22                                    Prior Living Arrangements/Services Living arrangements for the past 2 months: Single Family Home Lives with:: Self Patient language and need for interpreter reviewed:: Yes Do you feel safe going back to the place where you live?: Yes      Need for Family Participation in Patient Care: No (Comment)   Current home services: Home RN, Home PT Criminal Activity/Legal Involvement Pertinent to Current Situation/Hospitalization: No - Comment as needed  Activities of Daily Living      Permission Sought/Granted Permission sought to share information with : Facility Industrial/product designer granted to  share information with : Yes, Verbal Permission Granted     Permission granted to share info w AGENCY: [facility]        Emotional Assessment Appearance:: Appears stated age Attitude/Demeanor/Rapport: Engaged Affect (typically observed): Pleasant Orientation: : Oriented to Self, Oriented to Place, Oriented to  Time, Oriented to Situation Alcohol / Substance Use: Not Applicable Psych Involvement: No (comment)  Admission diagnosis:  Acute cystitis with hematuria [N30.01] Complicated UTI (urinary tract infection) [N39.0] Sepsis, due to unspecified organism, unspecified whether acute organ dysfunction present [facility]) [A41.9] Patient Active Problem List   Diagnosis Date Noted   Complicated UTI (urinary tract infection) 06/17/2022   Small bowel obstruction (HCC) 01/12/2022   Leukocytosis 01/12/2022   Anxiety 01/12/2022   Depression 01/12/2022   Restless leg syndrome 01/12/2022   Intractable nausea and vomiting 11/30/2021   Ileus (HCC) 11/30/2021   Obstructive uropathy 11/29/2021   AKI (acute kidney injury) (HCC) 11/29/2021   Generalized weakness 04/30/2020   Constipation 04/30/2020   Nephrolithiasis 04/30/2020   Pressure injury of skin 04/30/2020   Acute urinary retention 04/29/2020   Pain in limb 05/09/2019   Acute metabolic encephalopathy 03/08/2019   Bilateral hydronephrosis 03/07/2019   Altered mental status 03/07/2019   Hypertension    Abnormal ECG 02/28/2019   Atherosclerosis of abdominal aorta (HCC) 02/28/2019   Benign essential HTN 02/28/2019   Hyperlipidemia, mixed 02/28/2019   Neuropathy 01/21/2017   Pain  in right foot 01/21/2017   Pain in left foot 01/21/2017   Chronic pain syndrome 01/21/2017   Post laminectomy syndrome 01/21/2017   Status post insertion of spinal cord stimulator 01/21/2017   Uncomplicated opioid dependence (HCC) 01/21/2017   Chronic bilateral low back pain without sciatica 11/27/2015   Degenerative disc disease, lumbar 06/17/2015   Encounter for  therapeutic drug monitoring 06/17/2015   Nervous system device, implant, or graft complication, initial encounter 04/06/2014   Lumbosacral radiculopathy 12/09/2012   PCP:  Elfredia Nevins, MD Pharmacy:   [facility]- [place], [place] - [address] [place] [address] Phone: [phone] Fax: [phone]  [id number] [place], [place] - [address] AT [facility] OF [address] [place] [facility] [address] Phone: [phone] Fax: [phone]     Social Determinants of Health (SDOH) Interventions    Readmission Risk Interventions    06/19/2022    1:54 PM 01/16/2022    1:14 PM 12/01/2021    3:11 PM  Readmission Risk Prevention Plan  Transportation Screening Complete Complete Complete  PCP or Specialist Appt within 5-7 Days   Complete  Home Care Screening   Complete  Medication Review (RN CM)   Complete  HRI or Home Care Consult Complete Complete   Social Work Consult for Recovery Care Planning/Counseling Complete Complete   Palliative Care Screening Not Applicable Not Applicable   Medication Review Oceanographer) Complete Complete      Final next level of care: Home w Home Health Services Barriers to Discharge: Barriers Resolved   Patient Goals and CMS Choice   Choice offered to / list presented to : Patient  Discharge Placement                         Discharge Plan and Services Additional resources added to the After Visit Summary for   In-house Referral: Clinical Social Work   Post Acute Care Choice: Resumption of Svcs/PTA Provider                               Social Determinants of Health (SDOH) Interventions SDOH Screenings   Food Insecurity: No Food Insecurity (01/13/2022)  Housing: Low Risk  (01/13/2022)  Transportation Needs: No Transportation Needs (01/13/2022)  Utilities: Not At Risk (01/13/2022)  Tobacco Use: Medium Risk (06/17/2022)      Readmission Risk Interventions    06/19/2022    1:54 PM 01/16/2022    1:14 PM 12/01/2021    3:11 PM  Readmission Risk Prevention Plan  Transportation Screening Complete Complete Complete  PCP or Specialist Appt within 5-7 Days   Complete  Home Care Screening   Complete  Medication Review (RN CM)   Complete  HRI or Home Care Consult Complete Complete   Social Work Consult for Recovery Care Planning/Counseling Complete Complete   Palliative Care Screening Not Applicable Not Applicable   Medication Review Oceanographer) Complete Complete

## 2022-06-19 NOTE — Progress Notes (Signed)
PT Cancellation Note  Patient Details Name: Luke Foster MRN: [id number] DOB: 08-17-1950   Cancelled Treatment:    Reason Eval/Treat Not Completed: Other (comment) (Nursing giving patient a bath); Attempted PT treatment but patient being bathed by nursing.  9:58 AM, 06/19/22 Wyman Songster PT, DPT Physical Therapist at [facility]

## 2022-06-19 NOTE — Discharge Summary (Signed)
Physician Discharge Summary   Patient: Luke Foster MRN: [id number] DOB: Mar 04, 1950  Admit date:     06/17/2022  Discharge date: 06/19/22  Discharge Physician: Hollice Espy   PCP: Elfredia Nevins, MD   Recommendations at discharge:   New medication: Cipro 500 mg p.o. twice daily x 3 more doses Resume home health Advising home health that should patient's catheter stopped draining or if he experiences suprapubic pain, catheter should be changed within 24 hours.  Discharge Diagnoses: Principal Problem:   Complicated UTI (urinary tract infection) Active Problems:   Bilateral hydronephrosis   Hypertension   Benign essential HTN   Acute urinary retention  Resolved Problems:   * No resolved hospital problems. [facility] Course: Patient is a 72 year old male with past medical history of chronic neck and back pain status post spinal cord stimulator placement, previous small bowel obstruction, neurogenic bladder with Foley catheter and obesity who presented to the emergency room on 5/8 with complaints of abdominal pain and nausea that started this morning. CT scan of abdomen pelvis noted bilateral hydronephrosis and distended bladder despite Foley catheter present. Catheter was exchanged and patient then voided 2000 cc. Found to be with septic shock. Patient started on meropenem and admitted to hospitalist service.   Assessment and Plan: Septic shock, present on admission secondary to urinary tract infection can [place] to urinary retention from nonfunctioning Foley: Present on admission.  Urine cultures pending.  Met criteria for septic shock on admission given temperature greater than 100.5, tachycardia, tachypnea and lactic acid level initially at 5.5.  With aggressive fluid resuscitation, lactic acid level normalized.  Received 3 days of meropenem, discharged on 2 more days of p.o. Cipro.  Felt to be stable for discharge home.  Resume home health.  Have advised home health that should  patient's Foley catheter stopped draining or if he has suprapubic pain, catheter needs to be changed within 24 hours.   AKI: Creatinine on admission at 1.31.  GFR normally greater than 60 and at 59.  IV fluids, GFR improved and by day of discharge, creatinine down to 1.2.  History of neurogenic bladder: Has Foley catheter as above.  Continue Flomax and Vesicare   History of chronic neck and back pain status post spinal cord stimulator placement: Continue home pain medication   Obesity: Meets criteria BMI greater than 30   Depression: Continue Effexor   Hypertension: Continue Cardizem, holding Vasotec for now   Body mass index is 30.54 kg/m.         Consultants: None Procedures performed: Foley catheter exchanged in the ER Disposition: Home with home health Diet recommendation:  Discharge Diet Orders (From admission, onward)     Start     Ordered   06/19/22 0000  Diet - low sodium heart healthy        06/19/22 1331           Heart healthy DISCHARGE MEDICATION: Allergies as of 06/19/2022       Reactions   Erythromycin Itching        Medication List     TAKE these medications    acetaminophen 500 MG tablet Commonly known as: TYLENOL Take 1,000 mg by mouth every 6 (six) hours as needed for mild pain or moderate pain.   ascorbic acid 500 MG tablet Commonly known as: VITAMIN C Take 500 mg by mouth daily.   cholecalciferol 25 MCG (1000 UNIT) tablet Commonly known as: VITAMIN D3 Take 2 tablets (2,000 Units total) by mouth  daily.   ciprofloxacin 500 MG tablet Commonly known as: Cipro Take 1 tablet (500 mg total) by mouth 2 (two) times daily for 3 doses.   cyanocobalamin 500 MCG tablet Commonly known as: VITAMIN B12 Take 2 tablets (1,000 mcg total) by mouth 2 (two) times daily.   diltiazem 120 MG 24 hr capsule Commonly known as: CARDIZEM CD Take 1 capsule (120 mg total) by mouth every evening.   docusate sodium 100 MG capsule Commonly known as:  COLACE Take 1 capsule (100 mg total) by mouth in the morning, at noon, and at bedtime. What changed:  how much to take when to take this   enalapril 5 MG tablet Commonly known as: VASOTEC Take 5 mg by mouth 2 (two) times daily.   methylPREDNISolone 4 MG Tbpk tablet Commonly known as: MEDROL DOSEPAK Take by mouth.   mirabegron ER 50 MG Tb24 tablet Commonly known as: MYRBETRIQ Take 1 tablet (50 mg total) by mouth daily.   NON FORMULARY Pt uses a cpap at night   ondansetron 4 MG tablet Commonly known as: ZOFRAN Take 1 tablet (4 mg total) by mouth every 8 (eight) hours as needed for nausea or vomiting.   oxyCODONE 15 MG immediate release tablet Commonly known as: ROXICODONE Take 1 tablet (15 mg total) by mouth every 6 (six) hours as needed for pain.   polyethylene glycol 17 g packet Commonly known as: MIRALAX / GLYCOLAX Take 17 g by mouth 2 (two) times daily. What changed:  when to take this reasons to take this   rOPINIRole 0.5 MG tablet Commonly known as: REQUIP Take 0.5 mg by mouth at bedtime as needed (restless legs).   solifenacin 5 MG tablet Commonly known as: VESICARE Take 5 mg by mouth daily.   sulfamethoxazole-trimethoprim 400-80 MG tablet Commonly known as: BACTRIM Take 1 tablet by mouth at bedtime.   tamsulosin 0.4 MG Caps capsule Commonly known as: FLOMAX Take 1 capsule (0.4 mg total) by mouth every evening.   venlafaxine XR 75 MG 24 hr capsule Commonly known as: EFFEXOR-XR Take 1 capsule (75 mg total) by mouth in the morning and at bedtime.        Discharge Exam: Filed Weights   06/17/22 1325 06/17/22 2215  Weight: 90.7 kg 91.1 kg   General: Alert and oriented x 3, no acute distress Cardiovascular: Regular rate and rhythm, S1-S2  Condition at discharge: good  The results of significant diagnostics from this hospitalization (including imaging, microbiology, ancillary and laboratory) are listed below for reference.   Imaging Studies: CT  ABDOMEN PELVIS W CONTRAST  Result Date: 06/17/2022 CLINICAL DATA:  Sudden onset abdominal pain. Bowel obstruction suspected. EXAM: CT ABDOMEN AND PELVIS WITH CONTRAST TECHNIQUE: Multidetector CT imaging of the abdomen and pelvis was performed using the standard protocol following bolus administration of intravenous contrast. RADIATION DOSE REDUCTION: This exam was performed according to the departmental dose-optimization program which includes automated exposure control, adjustment of the mA and/or kV according to patient size and/or use of iterative reconstruction technique. CONTRAST:  OMNIPAQUE IOHEXOL 300 MG/ML  SOLN COMPARISON:  01/12/2022 FINDINGS: Lower chest: Similar appearance of chronic atelectasis or scarring in the inferior left posterior lung base. Hepatobiliary: No suspicious focal abnormality within the liver parenchyma. There is no evidence for gallstones, gallbladder wall thickening, or pericholecystic fluid. No intrahepatic or extrahepatic biliary dilation. Pancreas: No focal mass lesion. No dilatation of the main duct. No intraparenchymal cyst. No peripancreatic edema. Spleen: No splenomegaly. No focal mass lesion. Adrenals/Urinary Tract:  No adrenal nodule or mass. 7 x 6 mm stone in the lower pole right kidney is similar to prior. 12.4 cm simple cyst noted in the interpolar right kidney, only minimally increased from 12.0 cm previously. No followup imaging is recommended. There is right-sided mild to moderate hydroureteronephrosis with dilated right ureter extending down to the level of the right UVJ. No obstructing stone or mass lesion evident by CT imaging. Mild fullness noted left intrarenal collecting system and ureter with ureteral distension extending to the level of the left UVJ. No obstructing stone or mass lesion visible on the left. No substantial change 3 cm exophytic interpolar left renal cyst with simple imaging features. No followup imaging is recommended.Foley catheter is  identified in the urinary bladder although the bladder is not decompressed. Stomach/Bowel: Stomach is unremarkable. No gastric wall thickening. No evidence of outlet obstruction. Duodenum is normally positioned as is the ligament of Treitz. No small bowel wall thickening. No small bowel dilatation. The terminal ileum is normal. No gross colonic mass. No colonic wall thickening. Vascular/Lymphatic: There is moderate atherosclerotic calcification of the abdominal aorta without aneurysm. There is no gastrohepatic or hepatoduodenal ligament lymphadenopathy. No retroperitoneal or mesenteric lymphadenopathy. No pelvic sidewall lymphadenopathy. Reproductive: Prostate gland is enlarged. Other: No intraperitoneal free fluid. Musculoskeletal: No worrisome lytic or sclerotic osseous abnormality. Superior endplate compression deformity at T12 is stable in the interval IMPRESSION: 1. Bilateral mild to moderate hydroureteronephrosis with dilated ureters extending down to the UVJ level on both sides. No obstructing stone or mass lesion evident by CT imaging. Urinary bladder is moderately distended despite the presence of a Foley catheter. 2. 7 x 6 mm stone in the lower pole right kidney, similar to prior. 3. Bilateral renal cysts. 4. Marked Prostatomegaly. 5.  Aortic Atherosclerosis (ICD10-I70.0). Electronically Signed   By: Kennith Center M.D.   On: 06/17/2022 14:54    Microbiology: Results for orders placed or performed during the hospital encounter of 06/17/22  Urine Culture (for pregnant, neutropenic or urologic patients or patients with an indwelling urinary catheter)     Status: Abnormal   Collection Time: 06/17/22  2:51 PM   Specimen: Urine, Clean Catch  Result Value Ref Range Status   Specimen Description   Final    URINE, CLEAN CATCH Performed at [facility], [address], [place], [place] [address]    Special Requests   Final    NONE Performed at [facility], [address] [place], [place] [address]     Culture MULTIPLE SPECIES PRESENT, SUGGEST RECOLLECTION (A)  Final   Report Status 06/18/2022 FINAL  Final  Blood culture (routine x 2)     Status: None (Preliminary result)   Collection Time: 06/17/22  6:47 PM   Specimen: BLOOD  Result Value Ref Range Status   Specimen Description BLOOD BLOOD RIGHT ARM  Final   Special Requests   Final    BOTTLES DRAWN AEROBIC AND ANAEROBIC Blood Culture adequate volume   Culture   Final    NO GROWTH 2 DAYS Performed at [facility], [address], [place], [place] [address]    Report Status PENDING  Incomplete  Blood culture (routine x 2)     Status: None (Preliminary result)   Collection Time: 06/17/22  6:47 PM   Specimen: BLOOD  Result Value Ref Range Status   Specimen Description BLOOD BLOOD RIGHT WRIST  Final   Special Requests   Final    BOTTLES DRAWN AEROBIC AND ANAEROBIC Blood Culture adequate volume  Culture   Final    NO GROWTH 2 DAYS Performed at [facility], [address], [place], [place] [address]    Report Status PENDING  Incomplete    Labs: CBC: Recent Labs  Lab 06/17/22 1323 06/18/22 0407  WBC 10.0 8.7  NEUTROABS 7.7  --   HGB 13.4 12.5*  HCT 41.4 39.9  MCV 94.1 96.6  PLT 282 251   Basic Metabolic Panel: Recent Labs  Lab 06/17/22 1323 06/18/22 0407 06/19/22 0441  NA 136 138 138  K 4.1 4.4 3.8  CL 105 107 105  CO2 16* 23 24  GLUCOSE 111* 97 113*  BUN 26* 22 24*  CREATININE 1.31* 1.27* 1.20  CALCIUM 10.1 9.3 9.1   Liver Function Tests: Recent Labs  Lab 06/17/22 1323  AST 25  ALT 17  ALKPHOS 70  BILITOT 0.7  PROT 8.1  ALBUMIN 4.5   CBG: No results for input(s): "GLUCAP" in the last 168 hours.  Discharge time spent: less than 30 minutes.  Signed: Hollice Espy, MD [facility] 06/19/2022

## 2022-06-19 NOTE — Consult Note (Signed)
[facility] Customer service manager [facility]) Accountable Care Organization (ACO) [facility] Liaison Note  06/19/2022  Luke Foster [birth year] [id number]  Location: [facility] RN Hospital Liaison screened the patient remotely at [facility].  Insurance: [employer]   [name] is a 72 y.o. male who is a Primary Care Patient of Elfredia Nevins, MD. The patient was screened for  readmission hospitalization with noted high risk score for unplanned readmission risk with 2 IP in 6 months.  The patient was assessed for potential [facility]) Care Management service needs for post hospital transition for care coordination. Review of patient's electronic medical record reveals patient admitted with acute cystitis.  Plan: Pt will discharged home today with resumption of HHealth with [facility] for PT/RN. [facility] RN Hospital Liaison will continue to follow progress and disposition to asess for post hospital community care coordination/management needs.  Referral request for community care coordination: anticipate [facility] Transitions of Care Team follow up.   [facility] Care Management/Population Health does not replace or interfere with any arrangements made by the Inpatient Transition of Care team.   For questions contact:   Elliot Cousin, RN, BSN [facility] Liaison [place]   [facility]  Population Health Office Hours MTWF 8:00 am to 6 pm off on Thursday [phone] mobile [phone] [Office toll free line][facility] Office Hours are M-F 8:30 - 5 pm 24 hour nurse advise line [phone] Conceirge  [email or web address]

## 2022-06-19 NOTE — Progress Notes (Signed)
Discharge instructions reviewed with patient, utilizing teach back method. No questions at this time. Patient discharged to home.

## 2022-06-20 DIAGNOSIS — N1330 Unspecified hydronephrosis: Secondary | ICD-10-CM | POA: Diagnosis not present

## 2022-06-20 DIAGNOSIS — Z466 Encounter for fitting and adjustment of urinary device: Secondary | ICD-10-CM | POA: Diagnosis not present

## 2022-06-20 DIAGNOSIS — N319 Neuromuscular dysfunction of bladder, unspecified: Secondary | ICD-10-CM | POA: Diagnosis not present

## 2022-06-20 DIAGNOSIS — I700 Atherosclerosis of aorta: Secondary | ICD-10-CM | POA: Diagnosis not present

## 2022-06-20 DIAGNOSIS — M5417 Radiculopathy, lumbosacral region: Secondary | ICD-10-CM | POA: Diagnosis not present

## 2022-06-20 LAB — CULTURE, BLOOD (ROUTINE X 2)

## 2022-06-21 LAB — CULTURE, BLOOD (ROUTINE X 2): Special Requests: ADEQUATE

## 2022-06-22 DIAGNOSIS — N319 Neuromuscular dysfunction of bladder, unspecified: Secondary | ICD-10-CM | POA: Diagnosis not present

## 2022-06-22 DIAGNOSIS — M5417 Radiculopathy, lumbosacral region: Secondary | ICD-10-CM | POA: Diagnosis not present

## 2022-06-22 DIAGNOSIS — N1330 Unspecified hydronephrosis: Secondary | ICD-10-CM | POA: Diagnosis not present

## 2022-06-22 DIAGNOSIS — Z466 Encounter for fitting and adjustment of urinary device: Secondary | ICD-10-CM | POA: Diagnosis not present

## 2022-06-22 DIAGNOSIS — I700 Atherosclerosis of aorta: Secondary | ICD-10-CM | POA: Diagnosis not present

## 2022-06-22 LAB — CULTURE, BLOOD (ROUTINE X 2): Culture: NO GROWTH

## 2022-06-24 DIAGNOSIS — N319 Neuromuscular dysfunction of bladder, unspecified: Secondary | ICD-10-CM | POA: Diagnosis not present

## 2022-06-24 DIAGNOSIS — N1330 Unspecified hydronephrosis: Secondary | ICD-10-CM | POA: Diagnosis not present

## 2022-06-24 DIAGNOSIS — M5417 Radiculopathy, lumbosacral region: Secondary | ICD-10-CM | POA: Diagnosis not present

## 2022-06-24 DIAGNOSIS — Z466 Encounter for fitting and adjustment of urinary device: Secondary | ICD-10-CM | POA: Diagnosis not present

## 2022-06-24 DIAGNOSIS — I700 Atherosclerosis of aorta: Secondary | ICD-10-CM | POA: Diagnosis not present

## 2022-06-25 DIAGNOSIS — I700 Atherosclerosis of aorta: Secondary | ICD-10-CM | POA: Diagnosis not present

## 2022-06-25 DIAGNOSIS — N1330 Unspecified hydronephrosis: Secondary | ICD-10-CM | POA: Diagnosis not present

## 2022-06-25 DIAGNOSIS — M5417 Radiculopathy, lumbosacral region: Secondary | ICD-10-CM | POA: Diagnosis not present

## 2022-06-25 DIAGNOSIS — Z466 Encounter for fitting and adjustment of urinary device: Secondary | ICD-10-CM | POA: Diagnosis not present

## 2022-06-25 DIAGNOSIS — N319 Neuromuscular dysfunction of bladder, unspecified: Secondary | ICD-10-CM | POA: Diagnosis not present

## 2022-06-26 DIAGNOSIS — N319 Neuromuscular dysfunction of bladder, unspecified: Secondary | ICD-10-CM | POA: Diagnosis not present

## 2022-06-26 DIAGNOSIS — M5417 Radiculopathy, lumbosacral region: Secondary | ICD-10-CM | POA: Diagnosis not present

## 2022-06-26 DIAGNOSIS — N1330 Unspecified hydronephrosis: Secondary | ICD-10-CM | POA: Diagnosis not present

## 2022-06-26 DIAGNOSIS — Z466 Encounter for fitting and adjustment of urinary device: Secondary | ICD-10-CM | POA: Diagnosis not present

## 2022-06-26 DIAGNOSIS — I700 Atherosclerosis of aorta: Secondary | ICD-10-CM | POA: Diagnosis not present

## 2022-06-30 DIAGNOSIS — N319 Neuromuscular dysfunction of bladder, unspecified: Secondary | ICD-10-CM | POA: Diagnosis not present

## 2022-06-30 DIAGNOSIS — M5417 Radiculopathy, lumbosacral region: Secondary | ICD-10-CM | POA: Diagnosis not present

## 2022-06-30 DIAGNOSIS — N1330 Unspecified hydronephrosis: Secondary | ICD-10-CM | POA: Diagnosis not present

## 2022-06-30 DIAGNOSIS — I700 Atherosclerosis of aorta: Secondary | ICD-10-CM | POA: Diagnosis not present

## 2022-06-30 DIAGNOSIS — Z466 Encounter for fitting and adjustment of urinary device: Secondary | ICD-10-CM | POA: Diagnosis not present

## 2022-07-02 DIAGNOSIS — Z466 Encounter for fitting and adjustment of urinary device: Secondary | ICD-10-CM | POA: Diagnosis not present

## 2022-07-02 DIAGNOSIS — N1330 Unspecified hydronephrosis: Secondary | ICD-10-CM | POA: Diagnosis not present

## 2022-07-02 DIAGNOSIS — N319 Neuromuscular dysfunction of bladder, unspecified: Secondary | ICD-10-CM | POA: Diagnosis not present

## 2022-07-02 DIAGNOSIS — I700 Atherosclerosis of aorta: Secondary | ICD-10-CM | POA: Diagnosis not present

## 2022-07-02 DIAGNOSIS — M5417 Radiculopathy, lumbosacral region: Secondary | ICD-10-CM | POA: Diagnosis not present

## 2022-07-03 DIAGNOSIS — N1330 Unspecified hydronephrosis: Secondary | ICD-10-CM | POA: Diagnosis not present

## 2022-07-03 DIAGNOSIS — Z466 Encounter for fitting and adjustment of urinary device: Secondary | ICD-10-CM | POA: Diagnosis not present

## 2022-07-03 DIAGNOSIS — N319 Neuromuscular dysfunction of bladder, unspecified: Secondary | ICD-10-CM | POA: Diagnosis not present

## 2022-07-03 DIAGNOSIS — M5417 Radiculopathy, lumbosacral region: Secondary | ICD-10-CM | POA: Diagnosis not present

## 2022-07-03 DIAGNOSIS — I700 Atherosclerosis of aorta: Secondary | ICD-10-CM | POA: Diagnosis not present

## 2022-07-07 DIAGNOSIS — Z466 Encounter for fitting and adjustment of urinary device: Secondary | ICD-10-CM | POA: Diagnosis not present

## 2022-07-07 DIAGNOSIS — M5417 Radiculopathy, lumbosacral region: Secondary | ICD-10-CM | POA: Diagnosis not present

## 2022-07-07 DIAGNOSIS — N1330 Unspecified hydronephrosis: Secondary | ICD-10-CM | POA: Diagnosis not present

## 2022-07-07 DIAGNOSIS — N319 Neuromuscular dysfunction of bladder, unspecified: Secondary | ICD-10-CM | POA: Diagnosis not present

## 2022-07-07 DIAGNOSIS — I700 Atherosclerosis of aorta: Secondary | ICD-10-CM | POA: Diagnosis not present

## 2022-07-09 DIAGNOSIS — I700 Atherosclerosis of aorta: Secondary | ICD-10-CM | POA: Diagnosis not present

## 2022-07-09 DIAGNOSIS — Z466 Encounter for fitting and adjustment of urinary device: Secondary | ICD-10-CM | POA: Diagnosis not present

## 2022-07-09 DIAGNOSIS — N1330 Unspecified hydronephrosis: Secondary | ICD-10-CM | POA: Diagnosis not present

## 2022-07-09 DIAGNOSIS — M5417 Radiculopathy, lumbosacral region: Secondary | ICD-10-CM | POA: Diagnosis not present

## 2022-07-09 DIAGNOSIS — N319 Neuromuscular dysfunction of bladder, unspecified: Secondary | ICD-10-CM | POA: Diagnosis not present

## 2022-07-14 DIAGNOSIS — Z466 Encounter for fitting and adjustment of urinary device: Secondary | ICD-10-CM | POA: Diagnosis not present

## 2022-07-14 DIAGNOSIS — N1330 Unspecified hydronephrosis: Secondary | ICD-10-CM | POA: Diagnosis not present

## 2022-07-14 DIAGNOSIS — I700 Atherosclerosis of aorta: Secondary | ICD-10-CM | POA: Diagnosis not present

## 2022-07-14 DIAGNOSIS — N319 Neuromuscular dysfunction of bladder, unspecified: Secondary | ICD-10-CM | POA: Diagnosis not present

## 2022-07-14 DIAGNOSIS — M5417 Radiculopathy, lumbosacral region: Secondary | ICD-10-CM | POA: Diagnosis not present

## 2022-07-15 DIAGNOSIS — Z466 Encounter for fitting and adjustment of urinary device: Secondary | ICD-10-CM | POA: Diagnosis not present

## 2022-07-15 DIAGNOSIS — I700 Atherosclerosis of aorta: Secondary | ICD-10-CM | POA: Diagnosis not present

## 2022-07-15 DIAGNOSIS — N1330 Unspecified hydronephrosis: Secondary | ICD-10-CM | POA: Diagnosis not present

## 2022-07-15 DIAGNOSIS — M5417 Radiculopathy, lumbosacral region: Secondary | ICD-10-CM | POA: Diagnosis not present

## 2022-07-15 DIAGNOSIS — N319 Neuromuscular dysfunction of bladder, unspecified: Secondary | ICD-10-CM | POA: Diagnosis not present

## 2022-07-16 DIAGNOSIS — N319 Neuromuscular dysfunction of bladder, unspecified: Secondary | ICD-10-CM | POA: Diagnosis not present

## 2022-07-16 DIAGNOSIS — M479 Spondylosis, unspecified: Secondary | ICD-10-CM | POA: Diagnosis not present

## 2022-07-22 DIAGNOSIS — N1330 Unspecified hydronephrosis: Secondary | ICD-10-CM | POA: Diagnosis not present

## 2022-07-22 DIAGNOSIS — N319 Neuromuscular dysfunction of bladder, unspecified: Secondary | ICD-10-CM | POA: Diagnosis not present

## 2022-07-22 DIAGNOSIS — I700 Atherosclerosis of aorta: Secondary | ICD-10-CM | POA: Diagnosis not present

## 2022-07-22 DIAGNOSIS — Z466 Encounter for fitting and adjustment of urinary device: Secondary | ICD-10-CM | POA: Diagnosis not present

## 2022-07-22 DIAGNOSIS — M5417 Radiculopathy, lumbosacral region: Secondary | ICD-10-CM | POA: Diagnosis not present

## 2022-07-25 DIAGNOSIS — N1330 Unspecified hydronephrosis: Secondary | ICD-10-CM | POA: Diagnosis not present

## 2022-07-25 DIAGNOSIS — N319 Neuromuscular dysfunction of bladder, unspecified: Secondary | ICD-10-CM | POA: Diagnosis not present

## 2022-07-25 DIAGNOSIS — I700 Atherosclerosis of aorta: Secondary | ICD-10-CM | POA: Diagnosis not present

## 2022-07-25 DIAGNOSIS — Z466 Encounter for fitting and adjustment of urinary device: Secondary | ICD-10-CM | POA: Diagnosis not present

## 2022-07-25 DIAGNOSIS — M5417 Radiculopathy, lumbosacral region: Secondary | ICD-10-CM | POA: Diagnosis not present

## 2022-07-29 DIAGNOSIS — N319 Neuromuscular dysfunction of bladder, unspecified: Secondary | ICD-10-CM | POA: Diagnosis not present

## 2022-07-29 DIAGNOSIS — M5417 Radiculopathy, lumbosacral region: Secondary | ICD-10-CM | POA: Diagnosis not present

## 2022-07-29 DIAGNOSIS — I700 Atherosclerosis of aorta: Secondary | ICD-10-CM | POA: Diagnosis not present

## 2022-07-29 DIAGNOSIS — N1330 Unspecified hydronephrosis: Secondary | ICD-10-CM | POA: Diagnosis not present

## 2022-07-29 DIAGNOSIS — Z466 Encounter for fitting and adjustment of urinary device: Secondary | ICD-10-CM | POA: Diagnosis not present

## 2022-07-31 DIAGNOSIS — N1330 Unspecified hydronephrosis: Secondary | ICD-10-CM | POA: Diagnosis not present

## 2022-07-31 DIAGNOSIS — M5417 Radiculopathy, lumbosacral region: Secondary | ICD-10-CM | POA: Diagnosis not present

## 2022-07-31 DIAGNOSIS — Z466 Encounter for fitting and adjustment of urinary device: Secondary | ICD-10-CM | POA: Diagnosis not present

## 2022-07-31 DIAGNOSIS — I700 Atherosclerosis of aorta: Secondary | ICD-10-CM | POA: Diagnosis not present

## 2022-07-31 DIAGNOSIS — N319 Neuromuscular dysfunction of bladder, unspecified: Secondary | ICD-10-CM | POA: Diagnosis not present

## 2022-08-04 DIAGNOSIS — N319 Neuromuscular dysfunction of bladder, unspecified: Secondary | ICD-10-CM | POA: Diagnosis not present

## 2022-08-04 DIAGNOSIS — Z466 Encounter for fitting and adjustment of urinary device: Secondary | ICD-10-CM | POA: Diagnosis not present

## 2022-08-04 DIAGNOSIS — I700 Atherosclerosis of aorta: Secondary | ICD-10-CM | POA: Diagnosis not present

## 2022-08-04 DIAGNOSIS — M5417 Radiculopathy, lumbosacral region: Secondary | ICD-10-CM | POA: Diagnosis not present

## 2022-08-04 DIAGNOSIS — N1330 Unspecified hydronephrosis: Secondary | ICD-10-CM | POA: Diagnosis not present

## 2022-08-06 DIAGNOSIS — R339 Retention of urine, unspecified: Secondary | ICD-10-CM | POA: Diagnosis not present

## 2022-08-07 DIAGNOSIS — Z466 Encounter for fitting and adjustment of urinary device: Secondary | ICD-10-CM | POA: Diagnosis not present

## 2022-08-07 DIAGNOSIS — N319 Neuromuscular dysfunction of bladder, unspecified: Secondary | ICD-10-CM | POA: Diagnosis not present

## 2022-08-19 DIAGNOSIS — M5417 Radiculopathy, lumbosacral region: Secondary | ICD-10-CM | POA: Diagnosis not present

## 2022-08-19 DIAGNOSIS — N319 Neuromuscular dysfunction of bladder, unspecified: Secondary | ICD-10-CM | POA: Diagnosis not present

## 2022-08-19 DIAGNOSIS — I129 Hypertensive chronic kidney disease with stage 1 through stage 4 chronic kidney disease, or unspecified chronic kidney disease: Secondary | ICD-10-CM | POA: Diagnosis not present

## 2022-08-19 DIAGNOSIS — I700 Atherosclerosis of aorta: Secondary | ICD-10-CM | POA: Diagnosis not present

## 2022-08-19 DIAGNOSIS — T83018D Breakdown (mechanical) of other indwelling urethral catheter, subsequent encounter: Secondary | ICD-10-CM | POA: Diagnosis not present

## 2022-09-02 DIAGNOSIS — G894 Chronic pain syndrome: Secondary | ICD-10-CM | POA: Diagnosis not present

## 2022-09-02 DIAGNOSIS — N319 Neuromuscular dysfunction of bladder, unspecified: Secondary | ICD-10-CM | POA: Diagnosis not present

## 2022-09-02 DIAGNOSIS — M5417 Radiculopathy, lumbosacral region: Secondary | ICD-10-CM | POA: Diagnosis not present

## 2022-09-09 DIAGNOSIS — T83018D Breakdown (mechanical) of other indwelling urethral catheter, subsequent encounter: Secondary | ICD-10-CM | POA: Diagnosis not present

## 2022-09-09 DIAGNOSIS — I700 Atherosclerosis of aorta: Secondary | ICD-10-CM | POA: Diagnosis not present

## 2022-09-09 DIAGNOSIS — I129 Hypertensive chronic kidney disease with stage 1 through stage 4 chronic kidney disease, or unspecified chronic kidney disease: Secondary | ICD-10-CM | POA: Diagnosis not present

## 2022-09-09 DIAGNOSIS — N319 Neuromuscular dysfunction of bladder, unspecified: Secondary | ICD-10-CM | POA: Diagnosis not present

## 2022-09-09 DIAGNOSIS — M5417 Radiculopathy, lumbosacral region: Secondary | ICD-10-CM | POA: Diagnosis not present

## 2022-09-15 DIAGNOSIS — N319 Neuromuscular dysfunction of bladder, unspecified: Secondary | ICD-10-CM | POA: Diagnosis not present

## 2022-09-30 DIAGNOSIS — I700 Atherosclerosis of aorta: Secondary | ICD-10-CM | POA: Diagnosis not present

## 2022-09-30 DIAGNOSIS — M5417 Radiculopathy, lumbosacral region: Secondary | ICD-10-CM | POA: Diagnosis not present

## 2022-09-30 DIAGNOSIS — T83018D Breakdown (mechanical) of other indwelling urethral catheter, subsequent encounter: Secondary | ICD-10-CM | POA: Diagnosis not present

## 2022-09-30 DIAGNOSIS — N319 Neuromuscular dysfunction of bladder, unspecified: Secondary | ICD-10-CM | POA: Diagnosis not present

## 2022-09-30 DIAGNOSIS — I129 Hypertensive chronic kidney disease with stage 1 through stage 4 chronic kidney disease, or unspecified chronic kidney disease: Secondary | ICD-10-CM | POA: Diagnosis not present

## 2022-10-06 DIAGNOSIS — N319 Neuromuscular dysfunction of bladder, unspecified: Secondary | ICD-10-CM | POA: Diagnosis not present

## 2022-10-06 DIAGNOSIS — M5417 Radiculopathy, lumbosacral region: Secondary | ICD-10-CM | POA: Diagnosis not present

## 2022-10-06 DIAGNOSIS — I700 Atherosclerosis of aorta: Secondary | ICD-10-CM | POA: Diagnosis not present

## 2022-10-06 DIAGNOSIS — Z466 Encounter for fitting and adjustment of urinary device: Secondary | ICD-10-CM | POA: Diagnosis not present

## 2022-10-08 DIAGNOSIS — M5136 Other intervertebral disc degeneration, lumbar region: Secondary | ICD-10-CM | POA: Diagnosis not present

## 2022-10-08 DIAGNOSIS — N319 Neuromuscular dysfunction of bladder, unspecified: Secondary | ICD-10-CM | POA: Diagnosis not present

## 2022-10-11 DIAGNOSIS — I129 Hypertensive chronic kidney disease with stage 1 through stage 4 chronic kidney disease, or unspecified chronic kidney disease: Secondary | ICD-10-CM | POA: Diagnosis not present

## 2022-10-11 DIAGNOSIS — N319 Neuromuscular dysfunction of bladder, unspecified: Secondary | ICD-10-CM | POA: Diagnosis not present

## 2022-10-11 DIAGNOSIS — M5417 Radiculopathy, lumbosacral region: Secondary | ICD-10-CM | POA: Diagnosis not present

## 2022-10-11 DIAGNOSIS — I700 Atherosclerosis of aorta: Secondary | ICD-10-CM | POA: Diagnosis not present

## 2022-10-11 DIAGNOSIS — Z466 Encounter for fitting and adjustment of urinary device: Secondary | ICD-10-CM | POA: Diagnosis not present

## 2022-10-26 DIAGNOSIS — I129 Hypertensive chronic kidney disease with stage 1 through stage 4 chronic kidney disease, or unspecified chronic kidney disease: Secondary | ICD-10-CM | POA: Diagnosis not present

## 2022-10-26 DIAGNOSIS — M5417 Radiculopathy, lumbosacral region: Secondary | ICD-10-CM | POA: Diagnosis not present

## 2022-10-26 DIAGNOSIS — Z466 Encounter for fitting and adjustment of urinary device: Secondary | ICD-10-CM | POA: Diagnosis not present

## 2022-10-26 DIAGNOSIS — I700 Atherosclerosis of aorta: Secondary | ICD-10-CM | POA: Diagnosis not present

## 2022-10-26 DIAGNOSIS — N319 Neuromuscular dysfunction of bladder, unspecified: Secondary | ICD-10-CM | POA: Diagnosis not present

## 2022-10-27 ENCOUNTER — Telehealth: Payer: Self-pay

## 2022-10-27 NOTE — Telephone Encounter (Signed)
Patient's brother called saying his catheter was exchanged yesterday by home health. While it is draining well, he has severe urethral pain. They have tried readjusting the strap on his leg without relief. He does not have suprapubic pain.  Discussed that if he has refractory severe pain, the catheter stops draining, and/or he has fevers/chills he should proceed to the nearest ED. However, if pain is manageable and catheter is otherwise functioning, my recommendation would be for his [facility] agency to exchange his catheter and see if this helps with his pain.   I called [facility] at [phone] and gave this information to [facility] with my recommendation for catheter exchange tomorrow, 10/28/22.   Cathren Harsh, MD

## 2022-11-03 ENCOUNTER — Emergency Department (HOSPITAL_COMMUNITY)
Admission: EM | Admit: 2022-11-03 | Discharge: 2022-11-03 | Disposition: A | Discharge status: Home / Self Care | Payer: Medicare Other | Attending: Emergency Medicine | Admitting: Emergency Medicine

## 2022-11-03 ENCOUNTER — Other Ambulatory Visit: Payer: Self-pay

## 2022-11-03 ENCOUNTER — Emergency Department (HOSPITAL_COMMUNITY): Payer: Medicare Other

## 2022-11-03 ENCOUNTER — Encounter (HOSPITAL_COMMUNITY): Payer: Self-pay | Admitting: *Deleted

## 2022-11-03 DIAGNOSIS — T839XXA Unspecified complication of genitourinary prosthetic device, implant and graft, initial encounter: Secondary | ICD-10-CM | POA: Diagnosis present

## 2022-11-03 DIAGNOSIS — N200 Calculus of kidney: Secondary | ICD-10-CM | POA: Insufficient documentation

## 2022-11-03 DIAGNOSIS — I10 Essential (primary) hypertension: Secondary | ICD-10-CM | POA: Diagnosis not present

## 2022-11-03 DIAGNOSIS — A419 Sepsis, unspecified organism: Secondary | ICD-10-CM | POA: Diagnosis not present

## 2022-11-03 DIAGNOSIS — N319 Neuromuscular dysfunction of bladder, unspecified: Secondary | ICD-10-CM | POA: Diagnosis not present

## 2022-11-03 DIAGNOSIS — N400 Enlarged prostate without lower urinary tract symptoms: Secondary | ICD-10-CM | POA: Diagnosis not present

## 2022-11-03 DIAGNOSIS — R339 Retention of urine, unspecified: Secondary | ICD-10-CM | POA: Insufficient documentation

## 2022-11-03 DIAGNOSIS — N281 Cyst of kidney, acquired: Secondary | ICD-10-CM | POA: Diagnosis not present

## 2022-11-03 DIAGNOSIS — M5417 Radiculopathy, lumbosacral region: Secondary | ICD-10-CM | POA: Diagnosis not present

## 2022-11-03 DIAGNOSIS — I129 Hypertensive chronic kidney disease with stage 1 through stage 4 chronic kidney disease, or unspecified chronic kidney disease: Secondary | ICD-10-CM | POA: Diagnosis not present

## 2022-11-03 DIAGNOSIS — N2889 Other specified disorders of kidney and ureter: Secondary | ICD-10-CM | POA: Diagnosis not present

## 2022-11-03 DIAGNOSIS — I700 Atherosclerosis of aorta: Secondary | ICD-10-CM | POA: Insufficient documentation

## 2022-11-03 DIAGNOSIS — T83091A Other mechanical complication of indwelling urethral catheter, initial encounter: Secondary | ICD-10-CM | POA: Diagnosis not present

## 2022-11-03 DIAGNOSIS — Z466 Encounter for fitting and adjustment of urinary device: Secondary | ICD-10-CM | POA: Diagnosis not present

## 2022-11-03 LAB — URINALYSIS, W/ REFLEX TO CULTURE (INFECTION SUSPECTED)
Bilirubin Urine: NEGATIVE
Glucose, UA: NEGATIVE mg/dL
Ketones, ur: 5 mg/dL — AB
Nitrite: NEGATIVE
Protein, ur: 100 mg/dL — AB
RBC / HPF: >50 RBC/hpf (ref 0–5)
Specific Gravity, Urine: 1.019 (ref 1.005–1.030)
WBC, UA: >50 WBC/hpf (ref 0–5)
pH: 6 (ref 5.0–8.0)

## 2022-11-03 LAB — CBC WITH DIFFERENTIAL/PLATELET
Abs Immature Granulocytes: 0.03 10*3/uL (ref 0.00–0.07)
Basophils Absolute: 0.1 10*3/uL (ref 0.0–0.1)
Basophils Relative: 1 %
Eosinophils Absolute: 0.1 10*3/uL (ref 0.0–0.5)
Eosinophils Relative: 1 %
HCT: 40.8 % (ref 39.0–52.0)
Hemoglobin: 12.9 g/dL — ABNORMAL LOW (ref 13.0–17.0)
Immature Granulocytes: 0 %
Lymphocytes Relative: 11 %
Lymphs Abs: 1 10*3/uL (ref 0.7–4.0)
MCH: 31.1 pg (ref 26.0–34.0)
MCHC: 31.6 g/dL (ref 30.0–36.0)
MCV: 98.3 fL (ref 80.0–100.0)
Monocytes Absolute: 0.6 10*3/uL (ref 0.1–1.0)
Monocytes Relative: 6 %
Neutro Abs: 7.6 10*3/uL (ref 1.7–7.7)
Neutrophils Relative %: 81 %
Platelets: 280 10*3/uL (ref 150–400)
RBC: 4.15 MIL/uL — ABNORMAL LOW (ref 4.22–5.81)
RDW: 13.2 % (ref 11.5–15.5)
WBC: 9.4 10*3/uL (ref 4.0–10.5)
nRBC: 0 % (ref 0.0–0.2)

## 2022-11-03 LAB — I-STAT CHEM 8, ED
BUN: 27 mg/dL — ABNORMAL HIGH (ref 8–23)
Calcium, Ion: 1.31 mmol/L (ref 1.15–1.40)
Chloride: 106 mmol/L (ref 98–111)
Creatinine, Ser: 1.3 mg/dL — ABNORMAL HIGH (ref 0.61–1.24)
Glucose, Bld: 104 mg/dL — ABNORMAL HIGH (ref 70–99)
HCT: 42 % (ref 39.0–52.0)
Hemoglobin: 14.3 g/dL (ref 13.0–17.0)
Potassium: 4.4 mmol/L (ref 3.5–5.1)
Sodium: 138 mmol/L (ref 135–145)
TCO2: 21 mmol/L — ABNORMAL LOW (ref 22–32)

## 2022-11-03 LAB — COMPREHENSIVE METABOLIC PANEL
ALT: 14 U/L (ref 0–44)
AST: 19 U/L (ref 15–41)
Albumin: 4.2 g/dL (ref 3.5–5.0)
Alkaline Phosphatase: 70 U/L (ref 38–126)
Anion gap: 9 (ref 5–15)
BUN: 26 mg/dL — ABNORMAL HIGH (ref 8–23)
CO2: 21 mmol/L — ABNORMAL LOW (ref 22–32)
Calcium: 9.7 mg/dL (ref 8.9–10.3)
Chloride: 103 mmol/L (ref 98–111)
Creatinine, Ser: 1.26 mg/dL — ABNORMAL HIGH (ref 0.61–1.24)
GFR, Estimated: >60 mL/min (ref >60)
Glucose, Bld: 107 mg/dL — ABNORMAL HIGH (ref 70–99)
Potassium: 4.2 mmol/L (ref 3.5–5.1)
Sodium: 133 mmol/L — ABNORMAL LOW (ref 135–145)
Total Bilirubin: 0.5 mg/dL (ref 0.3–1.2)
Total Protein: 7.7 g/dL (ref 6.5–8.1)

## 2022-11-03 LAB — MAGNESIUM: Magnesium: 2.1 mg/dL (ref 1.7–2.4)

## 2022-11-03 LAB — LACTIC ACID, PLASMA
Lactic Acid, Venous: 1.4 mmol/L (ref 0.5–1.9)
Lactic Acid, Venous: 2.1 mmol/L (ref 0.5–1.9)
Lactic Acid, Venous: 1.1 mmol/L (ref 0.5–1.9)

## 2022-11-03 LAB — TROPONIN I (HIGH SENSITIVITY)
Troponin I (High Sensitivity): 7 ng/L (ref <18)
Troponin I (High Sensitivity): 7 ng/L (ref <18)

## 2022-11-03 MED ORDER — OXYCODONE HCL 5 MG PO TABS
15.0000 mg | ORAL_TABLET | Freq: Once | ORAL | Status: AC
  Administered 2022-11-03: 15 mg via ORAL
  Filled 2022-11-03: qty 3

## 2022-11-03 MED ORDER — MIRABEGRON ER 25 MG PO TB24
50.0000 mg | ORAL_TABLET | Freq: Every day | ORAL | Status: DC
  Administered 2022-11-03: 50 mg via ORAL
  Filled 2022-11-03: qty 2

## 2022-11-03 MED ORDER — LACTATED RINGERS IV BOLUS
1000.0000 mL | Freq: Once | INTRAVENOUS | Status: AC
  Administered 2022-11-03: 1000 mL via INTRAVENOUS

## 2022-11-03 MED ORDER — SULFAMETHOXAZOLE-TRIMETHOPRIM 800-160 MG PO TABS
0.5000 | ORAL_TABLET | Freq: Once | ORAL | Status: AC
  Administered 2022-11-03: 0.5 via ORAL
  Filled 2022-11-03: qty 1

## 2022-11-03 NOTE — ED Notes (Signed)
Patient transported to X-ray via Doctor, general practice

## 2022-11-03 NOTE — Discharge Instructions (Signed)
Testing done today is reassuring.  Catheter has been draining well while you are here in the emergency department.  Flush Foley catheter as needed.  Return to the emergency department for any further concerns.

## 2022-11-03 NOTE — ED Triage Notes (Signed)
Pt with foley catheter in place and HH tried flushing it today.  Pt was instructed to go to ED for foley changed.  Pt states quit draining since this morning, pt has had pain to lower abd pain.

## 2022-11-03 NOTE — ED Provider Notes (Signed)
[place] EMERGENCY DEPARTMENT AT [facility] Provider Note   CSN: [id number] Arrival date & time: 11/03/22  1703     History  No chief complaint on file.   Luke Foster is a 72 y.o. male.  HPI Patient presents for concern of urine retention.  Medical history includes arthritis, nephrolithiasis, sleep apnea, HTN, chronic back pain, neuropathy, anxiety, depression, RLS, and neurogenic bladder with chronic Foley catheter.  He has had an indwelling Foley catheter for the past 3 years.  It was last changed out 3 days ago on routine change.  He was hospitalized 4 months ago for septic shock secondary to UTI.  At the time, his catheter was not functioning and he had 2 L of retained urine.  This morning, patient was in his normal state of health.  Foley catheter was functioning normally.  At noon, he believes that drainage from his catheter stopped.  He has since developed some pressure in his lower abdomen.  This pressure is now improved.  He denies any other recent symptoms.    Home Medications Prior to Admission medications   Medication Sig Start Date End Date Taking? Authorizing Provider  acetaminophen (TYLENOL) 500 MG tablet Take 1,000 mg by mouth every 6 (six) hours as needed for mild pain or moderate pain.    [provider]  ascorbic acid (VITAMIN C) 500 MG tablet Take 500 mg by mouth daily.    [provider]  cholecalciferol (VITAMIN D3) 25 MCG (1000 UNIT) tablet Take 2 tablets (2,000 Units total) by mouth daily. 12/01/21   Rai, Delene Ruffini, MD  cyanocobalamin (VITAMIN B12) 500 MCG tablet Take 2 tablets (1,000 mcg total) by mouth 2 (two) times daily. 12/01/21   Rai, Delene Ruffini, MD  diltiazem (CARDIZEM CD) 120 MG 24 hr capsule Take 1 capsule (120 mg total) by mouth every evening. 05/17/20   Medina-Vargas, Monina C, NP  docusate sodium (COLACE) 100 MG capsule Take 1 capsule (100 mg total) by mouth in the morning, at noon, and at bedtime. Patient taking  differently: Take 300 mg by mouth at bedtime. 05/17/20   Medina-Vargas, Monina C, NP  enalapril (VASOTEC) 5 MG tablet Take 5 mg by mouth 2 (two) times daily. 01/19/22   [provider]  methylPREDNISolone (MEDROL DOSEPAK) 4 MG TBPK tablet Take by mouth. 06/17/22   [provider]  mirabegron ER (MYRBETRIQ) 50 MG TB24 tablet Take 1 tablet (50 mg total) by mouth daily. 05/17/20   Medina-Vargas, Monina C, NP  NON FORMULARY Pt uses a cpap at night    [provider]  ondansetron (ZOFRAN) 4 MG tablet Take 1 tablet (4 mg total) by mouth every 8 (eight) hours as needed for nausea or vomiting. 12/01/21   Rai, Ripudeep Kirtland Bouchard, MD  oxyCODONE (ROXICODONE) 15 MG immediate release tablet Take 1 tablet (15 mg total) by mouth every 6 (six) hours as needed for pain. 05/17/20   Medina-Vargas, Monina C, NP  polyethylene glycol (MIRALAX / GLYCOLAX) 17 g packet Take 17 g by mouth 2 (two) times daily. Patient taking differently: Take 17 g by mouth daily as needed for mild constipation or moderate constipation. 05/17/20   Medina-Vargas, Monina C, NP  rOPINIRole (REQUIP) 0.5 MG tablet Take 0.5 mg by mouth at bedtime as needed (restless legs). 09/22/21   [provider]  solifenacin (VESICARE) 5 MG tablet Take 5 mg by mouth daily. 07/17/21   [provider]  sulfamethoxazole-trimethoprim (BACTRIM) 400-80 MG tablet Take 1 tablet by  mouth at bedtime. 12/18/21   [provider]  tamsulosin (FLOMAX) 0.4 MG CAPS capsule Take 1 capsule (0.4 mg total) by mouth every evening. 05/17/20   Medina-Vargas, Monina C, NP  venlafaxine XR (EFFEXOR-XR) 75 MG 24 hr capsule Take 1 capsule (75 mg total) by mouth in the morning and at bedtime. 05/17/20   Medina-Vargas, Monina C, NP      Allergies    Erythromycin    Review of Systems   Review of Systems  Gastrointestinal:  Positive for abdominal distention.  Genitourinary:  Positive for decreased urine volume.  All other systems reviewed and are  negative.   Physical Exam Updated Vital Signs BP (!) 139/99 (BP Location: Left Arm)   Pulse 83   Temp 98.7 F (37.1 C) (Oral)   Resp 20   Ht 5\' 8"  (1.727 m)   Wt 95.3 kg   SpO2 100%   BMI 31.93 kg/m  Physical Exam Vitals and nursing note reviewed.  Constitutional:      General: He is not in acute distress.    Appearance: Normal appearance. He is well-developed. He is not ill-appearing, toxic-appearing or diaphoretic.  HENT:     Head: Normocephalic and atraumatic.     Right Ear: External ear normal.     Left Ear: External ear normal.     Nose: Nose normal.     Mouth/Throat:     Mouth: Mucous membranes are moist.  Eyes:     Extraocular Movements: Extraocular movements intact.     Conjunctiva/sclera: Conjunctivae normal.  Cardiovascular:     Rate and Rhythm: Normal rate and regular rhythm.  Pulmonary:     Effort: Pulmonary effort is normal. No respiratory distress.  Abdominal:     General: There is no distension.     Palpations: Abdomen is soft.     Tenderness: There is no abdominal tenderness.  Musculoskeletal:        General: No swelling or deformity.     Cervical back: Normal range of motion and neck supple.  Skin:    General: Skin is warm and dry.     Capillary Refill: Capillary refill takes less than 2 seconds.     Coloration: Skin is not jaundiced or pale.  Neurological:     General: No focal deficit present.     Mental Status: He is alert and oriented to person, place, and time.  Psychiatric:        Mood and Affect: Mood normal.        Behavior: Behavior normal.     ED Results / Procedures / Treatments   Labs (all labs ordered are listed, but only abnormal results are displayed) Labs Reviewed  LACTIC ACID, PLASMA - Abnormal; Notable for the following components:      Result Value   Lactic Acid, Venous 2.1 (*)    All other components within normal limits  COMPREHENSIVE METABOLIC PANEL - Abnormal; Notable for the following components:   Sodium 133 (*)     CO2 21 (*)    Glucose, Bld 107 (*)    BUN 26 (*)    Creatinine, Ser 1.26 (*)    All other components within normal limits  CBC WITH DIFFERENTIAL/PLATELET - Abnormal; Notable for the following components:   RBC 4.15 (*)    Hemoglobin 12.9 (*)    All other components within normal limits  URINALYSIS, W/ REFLEX TO CULTURE (INFECTION SUSPECTED) - Abnormal; Notable for the following components:   APPearance CLOUDY (*)  Hgb urine dipstick MODERATE (*)    Ketones, ur 5 (*)    Protein, ur 100 (*)    Leukocytes,Ua LARGE (*)    Bacteria, UA FEW (*)    All other components within normal limits  I-STAT CHEM 8, ED - Abnormal; Notable for the following components:   BUN 27 (*)    Creatinine, Ser 1.30 (*)    Glucose, Bld 104 (*)    TCO2 21 (*)    All other components within normal limits  CULTURE, BLOOD (ROUTINE X 2)  CULTURE, BLOOD (ROUTINE X 2)  URINE CULTURE  LACTIC ACID, PLASMA  MAGNESIUM  LACTIC ACID, PLASMA  TROPONIN I (HIGH SENSITIVITY)  TROPONIN I (HIGH SENSITIVITY)    EKG None  Radiology CT Renal Stone Study  Result Date: 11/03/2022 CLINICAL DATA:  Lower abdominal pain with abnormal Foley catheter drainage. EXAM: CT ABDOMEN AND PELVIS WITHOUT CONTRAST TECHNIQUE: Multidetector CT imaging of the abdomen and pelvis was performed following the standard protocol without IV contrast. RADIATION DOSE REDUCTION: This exam was performed according to the departmental dose-optimization program which includes automated exposure control, adjustment of the mA and/or kV according to patient size and/or use of iterative reconstruction technique. COMPARISON:  Jun 17, 2022 FINDINGS: Lower chest: No acute abnormality. Hepatobiliary: No focal liver abnormality is seen. No gallstones, gallbladder wall thickening, or biliary dilatation. Pancreas: Unremarkable. No pancreatic ductal dilatation or surrounding inflammatory changes. Spleen: Normal in size without focal abnormality. Adrenals/Urinary Tract:  Adrenal glands are unremarkable. The kidneys are mildly atrophic in size, without obstructing renal calculi or hydronephrosis. A cluster of subcentimeter nonobstructing renal calculi are seen within the mid and lower right kidney. A punctate nonobstructing left renal calculus is also seen. A 12.8 cm x 10.1 cm cyst is seen within the right kidney. A 3.3 cm x 2.5 cm cyst is also seen along the posterolateral aspect of the mid left kidney. A Foley catheter is seen within a collapsed urinary bladder. Mild inflammatory fat stranding is also seen surrounding the bladder. Numerous subcentimeter calcifications are seen layered along the dependent portion of the bladder. This represents a new finding when compared to the prior study. Stomach/Bowel: Stomach is within normal limits. Appendix appears normal. Surgically anastomosed bowel is seen along the midline of the lower abdomen. No evidence of bowel wall thickening, distention, or inflammatory changes. Vascular/Lymphatic: Aortic atherosclerosis. No enlarged abdominal or pelvic lymph nodes. Reproductive: There is moderate to marked severity prostate gland enlargement with mass effect seen along the posterior aspect of the base of the urinary bladder. Other: No abdominal wall hernia or abnormality. No abdominopelvic ascites. Musculoskeletal: Postoperative changes are again seen throughout the mid and lower lumbar spine. No acute osseous abnormalities are identified. IMPRESSION: 1. Foley catheter within a collapsed urinary bladder with mild inflammatory fat stranding surrounding the bladder. Correlation with urinalysis is recommended to exclude the presence of underlying cystitis. 2. Numerous subcentimeter calcifications layered along the dependent portion of the urinary bladder, consistent with bladder calculi. 3. Bilateral nonobstructing renal calculi. 4. Bilateral renal cysts. No follow-up imaging is recommended. This recommendation follows ACR consensus guidelines:  Management of the Incidental Renal Mass on CT: A White Paper of the ACR Incidental Findings Committee. J Am Coll Radiol [phone]. 5. Moderate to marked severity prostate gland enlargement. Correlation with PSA levels is recommended. 6. Postoperative changes throughout the mid and lower lumbar spine. 7. Aortic atherosclerosis. Aortic Atherosclerosis (ICD10-I70.0). Electronically Signed   By: Aram Candela M.D.   On: 11/03/2022 20:46  DG Chest Port 1 View  Result Date: 11/03/2022 CLINICAL DATA:  Sepsis. EXAM: PORTABLE CHEST 1 VIEW COMPARISON:  January 13, 2022. FINDINGS: The heart size and mediastinal contours are within normal limits. Minimal left basilar scarring is noted. No acute pulmonary disease is noted. The visualized skeletal structures are unremarkable. IMPRESSION: No active disease. Electronically Signed   By: Lupita Raider M.D.   On: 11/03/2022 19:21    Procedures Procedures    Medications Ordered in ED Medications  mirabegron ER (MYRBETRIQ) tablet 50 mg (50 mg Oral Given 11/03/22 1953)  lactated ringers bolus 1,000 mL (0 mLs Intravenous Stopped 11/03/22 1956)  oxyCODONE (Oxy IR/ROXICODONE) immediate release tablet 15 mg (15 mg Oral Given 11/03/22 1953)  sulfamethoxazole-trimethoprim (BACTRIM DS) 800-160 MG per tablet 0.5 tablet (0.5 tablets Oral Given 11/03/22 1953)    ED Course/ Medical Decision Making/ A&P                                 Medical Decision Making Amount and/or Complexity of Data Reviewed Labs: ordered. Radiology: ordered. ECG/medicine tests: ordered.  Risk Prescription drug management.   This patient presents to the ED for concern of decreased urine output, this involves an extensive number of treatment options, and is a complaint that carries with it a high risk of complications and morbidity.  The differential diagnosis includes Foley catheter malfunction, dehydration, obstructive uropathy   Co morbidities that complicate the patient  evaluation  arthritis, nephrolithiasis, sleep apnea, HTN, chronic back pain, neuropathy, anxiety, depression, RLS, and neurogenic bladder with chronic Foley catheter   Additional history obtained:  Additional history obtained from patient's brother External records from outside source obtained and reviewed including EMR   Lab Tests:  I Ordered, and personally interpreted labs.  The pertinent results include: Baseline creatinine, normal electrolytes, baseline hemoglobin, no leukocytosis, equivocal urinalysis in the setting of chronic indwelling Foley catheter   Imaging Studies ordered:  I ordered imaging studies including chest x-ray, CT stone study I independently visualized and interpreted imaging which showed no obstructive uropathy.  Nonobstructive stones are present in kidneys and bladder. I agree with the radiologist interpretation   Cardiac Monitoring: / EKG:  The patient was maintained on a cardiac monitor.  I personally viewed and interpreted the cardiac monitored which showed an underlying rhythm of: Sinus rhythm  Problem List / ED Course / Critical interventions / Medication management  Patient presents for decreased urine output from Foley catheter.  Onset was 5 hours prior to arrival.  Initial vital signs were concerning for hypotension and tachycardia.  Patient was placed on bedside cardiac monitor.  At this time, heart rate is normal in the 90s.  Blood pressure is normal as well.  He states that he has felt fine and was only concerned that his catheter stopped draining.  On bedside ultrasound, bladder is decompressed with Foley balloon in proper position.  Currently, there is 400 cc in his Foley bag which was last emptied this morning.  IV fluids were ordered.  Will monitor urine output while in the ED.  Given his concerning initial vital signs, will also obtain lab work to assess for possible infection/sepsis.  Lab results are reassuring.  No leukocytosis is present.   Creatinine is baseline.  Lactate is normal.  On reassessment, patient is resting comfortably.  He requests his home medications, which were ordered.  He takes Bactrim as prophylaxis for urinary infections.  Since his  arrival in the ED, he has had 100 cc of urine output.  He thinks he saw a stone transit through Foley catheter tubing.  He states that he has a long history of kidney stones.  CT stone study was ordered to assess for obstructive uropathy.  On CT scan, there was no ureteral stones.  There were nonobstructive kidney and bladder stones.  Patient's previous Foley catheter malfunction may be secondary to stone in the tube, as suspected by him and his brother.  Foley catheter continues to drain throughout his stay in the ED.  Vital signs remain normal.  Second troponin was increased from the first.  Third troponin was normal.  I suspect possible lab error on the second one.  Urinalysis is equivocal given his chronic indwelling Foley catheter.  I suspect colonization.  Given the resolution of his previous Foley dysfunction, patient is stable for discharge. I ordered medication including IV fluids for hydration; home medications Reevaluation of the patient after these medicines showed that the patient improved I have reviewed the patients home medicines and have made adjustments as needed   Social Determinants of Health:  Has access to outpatient care        Final Clinical Impression(s) / ED Diagnoses Final diagnoses:  Problem with Foley catheter, initial encounter [facility])    Rx / DC Orders ED Discharge Orders     None         Gloris Manchester, MD 11/03/22 2326

## 2022-11-05 LAB — URINE CULTURE

## 2022-11-05 LAB — CULTURE, BLOOD (ROUTINE X 2)
Special Requests: ADEQUATE
Special Requests: ADEQUATE

## 2022-11-08 LAB — CULTURE, BLOOD (ROUTINE X 2)
Culture: NO GROWTH
Culture: NO GROWTH

## 2022-11-12 DIAGNOSIS — N319 Neuromuscular dysfunction of bladder, unspecified: Secondary | ICD-10-CM | POA: Diagnosis not present

## 2022-11-12 DIAGNOSIS — G894 Chronic pain syndrome: Secondary | ICD-10-CM | POA: Diagnosis not present

## 2022-11-12 DIAGNOSIS — N210 Calculus in bladder: Secondary | ICD-10-CM | POA: Diagnosis not present

## 2022-11-17 ENCOUNTER — Encounter (HOSPITAL_COMMUNITY): Payer: Self-pay

## 2022-11-17 ENCOUNTER — Emergency Department (HOSPITAL_COMMUNITY)
Admission: EM | Admit: 2022-11-17 | Discharge: 2022-11-17 | Disposition: A | Discharge status: Home / Self Care | Payer: Medicare Other | Attending: Emergency Medicine | Admitting: Emergency Medicine

## 2022-11-17 ENCOUNTER — Other Ambulatory Visit: Payer: Self-pay

## 2022-11-17 DIAGNOSIS — Z960 Presence of urogenital implants: Secondary | ICD-10-CM | POA: Diagnosis not present

## 2022-11-17 DIAGNOSIS — M5417 Radiculopathy, lumbosacral region: Secondary | ICD-10-CM | POA: Diagnosis not present

## 2022-11-17 DIAGNOSIS — I129 Hypertensive chronic kidney disease with stage 1 through stage 4 chronic kidney disease, or unspecified chronic kidney disease: Secondary | ICD-10-CM | POA: Diagnosis not present

## 2022-11-17 DIAGNOSIS — R3912 Poor urinary stream: Secondary | ICD-10-CM | POA: Insufficient documentation

## 2022-11-17 DIAGNOSIS — I10 Essential (primary) hypertension: Secondary | ICD-10-CM | POA: Diagnosis not present

## 2022-11-17 DIAGNOSIS — Z466 Encounter for fitting and adjustment of urinary device: Secondary | ICD-10-CM | POA: Diagnosis not present

## 2022-11-17 DIAGNOSIS — Z978 Presence of other specified devices: Secondary | ICD-10-CM

## 2022-11-17 DIAGNOSIS — N319 Neuromuscular dysfunction of bladder, unspecified: Secondary | ICD-10-CM | POA: Diagnosis not present

## 2022-11-17 DIAGNOSIS — Z79899 Other long term (current) drug therapy: Secondary | ICD-10-CM | POA: Insufficient documentation

## 2022-11-17 DIAGNOSIS — T83198A Other mechanical complication of other urinary devices and implants, initial encounter: Secondary | ICD-10-CM | POA: Diagnosis not present

## 2022-11-17 DIAGNOSIS — I700 Atherosclerosis of aorta: Secondary | ICD-10-CM | POA: Diagnosis not present

## 2022-11-17 DIAGNOSIS — Z743 Need for continuous supervision: Secondary | ICD-10-CM | POA: Diagnosis not present

## 2022-11-17 DIAGNOSIS — R34 Anuria and oliguria: Secondary | ICD-10-CM

## 2022-11-17 NOTE — ED Provider Notes (Signed)
[place] EMERGENCY DEPARTMENT AT [facility] Provider Note   CSN: [id number] Arrival date & time: 11/17/22  1925     History  Chief Complaint  Patient presents with   catheter problems    Luke Foster is a 72 y.o. male.  HPI Patient presents for urine catheter concerns.  Medical history includes arthritis, nephrolithiasis, sleep apnea, urine retention, HTN, neuropathy.  He has had indwelling Foley catheter for the past 3 years.  He gets his switched out monthly.  He had it switched out today at 5:30 PM.  Since that time, he has a decreased urine output.  He was concerned of possible catheter obstruction.  He denies any recent symptoms.  Since his arrival in the ED, he has had 50 cc of urine output into catheter bag.    Home Medications Prior to Admission medications   Medication Sig Start Date End Date Taking? Authorizing Provider  acetaminophen (TYLENOL) 500 MG tablet Take 1,000 mg by mouth every 6 (six) hours as needed for mild pain or moderate pain.    [provider]  ascorbic acid (VITAMIN C) 500 MG tablet Take 500 mg by mouth daily.    [provider]  cholecalciferol (VITAMIN D3) 25 MCG (1000 UNIT) tablet Take 2 tablets (2,000 Units total) by mouth daily. 12/01/21   Rai, Delene Ruffini, MD  cyanocobalamin (VITAMIN B12) 500 MCG tablet Take 2 tablets (1,000 mcg total) by mouth 2 (two) times daily. 12/01/21   Rai, Delene Ruffini, MD  diltiazem (CARDIZEM CD) 120 MG 24 hr capsule Take 1 capsule (120 mg total) by mouth every evening. 05/17/20   Medina-Vargas, Monina C, NP  docusate sodium (COLACE) 100 MG capsule Take 1 capsule (100 mg total) by mouth in the morning, at noon, and at bedtime. Patient taking differently: Take 300 mg by mouth at bedtime. 05/17/20   Medina-Vargas, Monina C, NP  enalapril (VASOTEC) 5 MG tablet Take 5 mg by mouth 2 (two) times daily. 01/19/22   [provider]  methylPREDNISolone (MEDROL DOSEPAK) 4 MG TBPK tablet Take by mouth.  06/17/22   [provider]  mirabegron ER (MYRBETRIQ) 50 MG TB24 tablet Take 1 tablet (50 mg total) by mouth daily. 05/17/20   Medina-Vargas, Monina C, NP  NON FORMULARY Pt uses a cpap at night    [provider]  ondansetron (ZOFRAN) 4 MG tablet Take 1 tablet (4 mg total) by mouth every 8 (eight) hours as needed for nausea or vomiting. 12/01/21   Rai, Ripudeep Kirtland Bouchard, MD  oxyCODONE (ROXICODONE) 15 MG immediate release tablet Take 1 tablet (15 mg total) by mouth every 6 (six) hours as needed for pain. 05/17/20   Medina-Vargas, Monina C, NP  polyethylene glycol (MIRALAX / GLYCOLAX) 17 g packet Take 17 g by mouth 2 (two) times daily. Patient taking differently: Take 17 g by mouth daily as needed for mild constipation or moderate constipation. 05/17/20   Medina-Vargas, Monina C, NP  rOPINIRole (REQUIP) 0.5 MG tablet Take 0.5 mg by mouth at bedtime as needed (restless legs). 09/22/21   [provider]  solifenacin (VESICARE) 5 MG tablet Take 5 mg by mouth daily. 07/17/21   [provider]  sulfamethoxazole-trimethoprim (BACTRIM) 400-80 MG tablet Take 1 tablet by mouth at bedtime. 12/18/21   [provider]  tamsulosin (FLOMAX) 0.4 MG CAPS capsule Take 1 capsule (0.4 mg total) by mouth every evening. 05/17/20   Medina-Vargas, Monina C, NP  venlafaxine XR (EFFEXOR-XR) 75 MG 24 hr capsule  Take 1 capsule (75 mg total) by mouth in the morning and at bedtime. 05/17/20   Medina-Vargas, Monina C, NP      Allergies    Erythromycin    Review of Systems   Review of Systems  Genitourinary:  Positive for decreased urine volume.  All other systems reviewed and are negative.   Physical Exam Updated Vital Signs BP 107/69   Pulse 83   Resp 18   Ht 5\' 8"  (1.727 m)   Wt 88.5 kg   SpO2 100%   BMI 29.65 kg/m  Physical Exam Vitals and nursing note reviewed.  Constitutional:      General: He is not in acute distress.    Appearance: Normal appearance. He is well-developed. He is not  ill-appearing, toxic-appearing or diaphoretic.  HENT:     Head: Normocephalic and atraumatic.     Right Ear: External ear normal.     Left Ear: External ear normal.     Nose: Nose normal.     Mouth/Throat:     Mouth: Mucous membranes are moist.  Eyes:     Extraocular Movements: Extraocular movements intact.     Conjunctiva/sclera: Conjunctivae normal.  Cardiovascular:     Rate and Rhythm: Normal rate and regular rhythm.  Pulmonary:     Effort: Pulmonary effort is normal. No respiratory distress.  Abdominal:     General: There is no distension.     Palpations: Abdomen is soft.     Tenderness: There is no abdominal tenderness.  Musculoskeletal:        General: No swelling or deformity.     Cervical back: Normal range of motion and neck supple.  Skin:    General: Skin is warm and dry.     Capillary Refill: Capillary refill takes less than 2 seconds.     Coloration: Skin is not jaundiced or pale.  Neurological:     General: No focal deficit present.     Mental Status: He is alert and oriented to person, place, and time.  Psychiatric:        Mood and Affect: Mood normal.        Behavior: Behavior normal.     ED Results / Procedures / Treatments   Labs (all labs ordered are listed, but only abnormal results are displayed) Labs Reviewed - No data to display  EKG None  Radiology No results found.  Procedures Procedures    Medications Ordered in ED Medications - No data to display  ED Course/ Medical Decision Making/ A&P                                 Medical Decision Making  Patient presents for decreased urine output today.  He maintains an indwelling Foley catheter which was switched out this evening.  When he arrived in the ED, he had 50 cc of urine output in the bag.  Since that time, he has had an additional 50 cc of unit drainage.  He denies any current symptoms.  His abdomen is nondistended and nontender.  On bedside ultrasound, bladder is decompressed.   Patient is currently on Bactrim for UTI prophylaxis.  Given absence of any other symptoms or concerns, he was discharged in good condition.        Final Clinical Impression(s) / ED Diagnoses Final diagnoses:  Decreased urine output  Foley catheter in place    Rx / DC Orders ED Discharge Orders  None         Gloris Manchester, MD 11/17/22 2136

## 2022-11-17 NOTE — Discharge Instructions (Signed)
Stay hydrated to maintain dilute urine.  Flush urine catheter as needed.  Return to the emergency department for any further concerns.

## 2022-11-17 NOTE — ED Triage Notes (Signed)
Home care nurse came an hour ago. Leaking prior to change and is still leaking per EMS.

## 2022-11-17 NOTE — ED Triage Notes (Addendum)
Pt stated catheter drainage hasn't been draining initially noticed today for irrigation.  Denies hurting currently. Decreased output today. Seen home health nurse today. Blood and sediments seen in foley bag.

## 2022-12-02 DIAGNOSIS — Z466 Encounter for fitting and adjustment of urinary device: Secondary | ICD-10-CM | POA: Diagnosis not present

## 2022-12-02 DIAGNOSIS — I129 Hypertensive chronic kidney disease with stage 1 through stage 4 chronic kidney disease, or unspecified chronic kidney disease: Secondary | ICD-10-CM | POA: Diagnosis not present

## 2022-12-02 DIAGNOSIS — I700 Atherosclerosis of aorta: Secondary | ICD-10-CM | POA: Diagnosis not present

## 2022-12-02 DIAGNOSIS — N319 Neuromuscular dysfunction of bladder, unspecified: Secondary | ICD-10-CM | POA: Diagnosis not present

## 2022-12-02 DIAGNOSIS — M5417 Radiculopathy, lumbosacral region: Secondary | ICD-10-CM | POA: Diagnosis not present

## 2022-12-10 DIAGNOSIS — M5417 Radiculopathy, lumbosacral region: Secondary | ICD-10-CM | POA: Diagnosis not present

## 2022-12-10 DIAGNOSIS — N319 Neuromuscular dysfunction of bladder, unspecified: Secondary | ICD-10-CM | POA: Diagnosis not present

## 2022-12-10 DIAGNOSIS — Z466 Encounter for fitting and adjustment of urinary device: Secondary | ICD-10-CM | POA: Diagnosis not present

## 2022-12-10 DIAGNOSIS — I700 Atherosclerosis of aorta: Secondary | ICD-10-CM | POA: Diagnosis not present

## 2022-12-15 ENCOUNTER — Other Ambulatory Visit: Payer: Self-pay | Admitting: Urology

## 2022-12-16 NOTE — Progress Notes (Signed)
Sent message, via epic in basket, requesting orders in epic from surgeon.  

## 2022-12-21 DIAGNOSIS — G629 Polyneuropathy, unspecified: Secondary | ICD-10-CM | POA: Diagnosis not present

## 2022-12-21 DIAGNOSIS — M5417 Radiculopathy, lumbosacral region: Secondary | ICD-10-CM | POA: Diagnosis not present

## 2022-12-21 DIAGNOSIS — G894 Chronic pain syndrome: Secondary | ICD-10-CM | POA: Diagnosis not present

## 2022-12-21 NOTE — Patient Instructions (Signed)
SURGICAL WAITING ROOM VISITATION  Patients having surgery or a procedure may have no more than 2 support people in the waiting area - these visitors may rotate.    Children under the age of 14 must have an adult with them who is not the patient.  Due to an increase in RSV and influenza rates and associated hospitalizations, children ages 54 and under may not visit patients in [place] hospitals.  If the patient needs to stay at the hospital during part of their recovery, the visitor guidelines for inpatient rooms apply. Pre-op nurse will coordinate an appropriate time for 1 support person to accompany patient in pre-op.  This support person may not rotate.    Please refer to the [facility] website for the visitor guidelines for Inpatients (after your surgery is over and you are in a regular room).       Your procedure is scheduled on: 12/28/22   Report to [facility] Main Entrance    Report to admitting at 6:15 AM   Call this number if you have problems the morning of surgery [phone]   Do not eat food or drink liquids :After Midnight. .          Oral Hygiene is also important to reduce your risk of infection.                                    Remember - BRUSH YOUR TEETH THE MORNING OF SURGERY WITH YOUR REGULAR TOOTHPASTE  DENTURES WILL BE REMOVED PRIOR TO SURGERY PLEASE DO NOT APPLY "Poly grip" OR ADHESIVES!!!     Stop all vitamins and herbal supplements 7 days before surgery.   Take these medicines the morning of surgery with A SIP OF WATER: Tylenol, Myrbetriq, Vesicare, Bactrim, tamsulosin, Effexor  Bring CPAP mask and tubing day of surgery.                              You may not have any metal on your body including hair pins, jewelry, and body piercing             Do not wear make-up, lotions, powders, perfumes/cologne, or deodorant  Do not wear nail polish including gel and S&S, artificial/acrylic nails, or any other type of covering on natural  nails including finger and toenails. If you have artificial nails, gel coating, etc. that needs to be removed by a nail salon please have this removed prior to surgery or surgery may need to be canceled/ delayed if the surgeon/ anesthesia feels like they are unable to be safely monitored.   Do not shave  48 hours prior to surgery.               Men may shave face and neck.   Do not bring valuables to the hospital. [facility] IS NOT             RESPONSIBLE   FOR VALUABLES.   Contacts, glasses, dentures or bridgework may not be worn into surgery.   Bring small overnight bag day of surgery.   DO NOT BRING YOUR HOME MEDICATIONS TO THE HOSPITAL. PHARMACY WILL DISPENSE MEDICATIONS LISTED ON YOUR MEDICATION LIST TO YOU DURING YOUR ADMISSION IN THE HOSPITAL!    Patients discharged on the day of surgery will not be allowed to drive home.  Someone NEEDS to stay with  you for the first 24 hours after anesthesia.   Special Instructions: Bring a copy of your healthcare power of attorney and living will documents the day of surgery if you haven't scanned them before.              Please read over the following fact sheets you were given: IF YOU HAVE QUESTIONS ABOUT YOUR PRE-OP INSTRUCTIONS PLEASE CALL [phone] Rosey Bath   If you received a COVID test during your pre-op visit  it is requested that you wear a mask when out in public, stay away from anyone that may not be feeling well and notify your surgeon if you develop symptoms. If you test positive for Covid or have been in contact with anyone that has tested positive in the last 10 days please notify you surgeon.    [facility] - Preparing for Surgery Before surgery, you can play an important role.  Because skin is not sterile, your skin needs to be as free of germs as possible.  You can reduce the number of germs on your skin by washing with CHG (chlorahexidine gluconate) soap before surgery.  CHG is an antiseptic cleaner which kills germs and bonds  with the skin to continue killing germs even after washing. Please DO NOT use if you have an allergy to CHG or antibacterial soaps.  If your skin becomes reddened/irritated stop using the CHG and inform your nurse when you arrive at Short Stay. Do not shave (including legs and underarms) for at least 48 hours prior to the first CHG shower.  You may shave your face/neck.  Please follow these instructions carefully:  1.  Shower with CHG Soap the night before surgery and the  morning of surgery.  2.  If you choose to wash your hair, wash your hair first as usual with your normal  shampoo.  3.  After you shampoo, rinse your hair and body thoroughly to remove the shampoo.                             4.  Use CHG as you would any other liquid soap.  You can apply chg directly to the skin and wash.  Gently with a scrungie or clean washcloth.  5.  Apply the CHG Soap to your body ONLY FROM THE NECK DOWN.   Do   not use on face/ open                           Wound or open sores. Avoid contact with eyes, ears mouth and   genitals (private parts).                       Wash face,  Genitals (private parts) with your normal soap.             6.  Wash thoroughly, paying special attention to the area where your    surgery  will be performed.  7.  Thoroughly rinse your body with warm water from the neck down.  8.  DO NOT shower/wash with your normal soap after using and rinsing off the CHG Soap.                9.  Pat yourself dry with a clean towel.            10.  Wear clean pajamas.  11.  Place clean sheets on your bed the night of your first shower and do not  sleep with pets. Day of Surgery : Do not apply any lotions/deodorants the morning of surgery.  Please wear clean clothes to the hospital/surgery center.  FAILURE TO FOLLOW THESE INSTRUCTIONS MAY RESULT IN THE CANCELLATION OF YOUR SURGERY  PATIENT SIGNATURE_________________________________  NURSE  SIGNATURE__________________________________  ________________________________________________________________________

## 2022-12-21 NOTE — Progress Notes (Signed)
COVID Vaccine received:  []  No [x]  Yes Date of any COVID positive Test in last 90 days: no PCP - Elfredia Nevins MD Cardiologist - no  Chest x-ray - 10/14/22 Epic EKG -  06/18/22 Epic Stress Test -  ECHO -  Cardiac Cath -   Bowel Prep - [x]  No  []   Yes ______  Pacemaker / ICD device [x]  No []  Yes   Spinal Cord Stimulator:[x]  No []  Yes       History of Sleep Apnea? []  No [x]  Yes   CPAP used?- []  No [x]  Yes    Does the patient monitor blood sugar?          [x]  No []  Yes  []  N/A  Patient has: [x]  NO Hx DM   []  Pre-DM                 []  DM1  []   DM2 Does patient have a [employer] or Dexacom? []  No []  Yes   Fasting Blood Sugar Ranges-  Checks Blood Sugar _____ times a day  GLP1 agonist / usual dose - no GLP1 instructions:  SGLT-2 inhibitors / usual dose - no SGLT-2 instructions:   Blood Thinner / Instructions:no Aspirin Instructions:no  Comments:   Activity level: Patient is able  to climb a flight of stairs without difficulty; [x]  No CP  [x]  No SOB,_   Patient can / perform ADLs without assistance.   Anesthesia review:   Patient denies shortness of breath, fever, cough and chest pain at PAT appointment.  Patient verbalized understanding and agreement to the Pre-Surgical Instructions that were given to them at this PAT appointment. Patient was also educated of the need to review these PAT instructions again prior to his/her surgery.I reviewed the appropriate phone numbers to call if they have any and questions or concerns.

## 2022-12-23 ENCOUNTER — Encounter (HOSPITAL_COMMUNITY)
Admission: RE | Admit: 2022-12-23 | Discharge: 2022-12-23 | Disposition: A | Discharge status: Home / Self Care | Payer: Medicare Other | Source: Ambulatory Visit | Attending: Urology | Admitting: Urology

## 2022-12-23 ENCOUNTER — Other Ambulatory Visit: Payer: Self-pay

## 2022-12-23 VITALS — BP 120/69 | HR 78 | Temp 98.8°F | Resp 16 | Ht 68.0 in | Wt 200.0 lb

## 2022-12-23 DIAGNOSIS — I10 Essential (primary) hypertension: Secondary | ICD-10-CM | POA: Insufficient documentation

## 2022-12-23 DIAGNOSIS — Z01812 Encounter for preprocedural laboratory examination: Secondary | ICD-10-CM | POA: Diagnosis not present

## 2022-12-23 LAB — BASIC METABOLIC PANEL
Anion gap: 11 (ref 5–15)
BUN: 24 mg/dL — ABNORMAL HIGH (ref 8–23)
CO2: 21 mmol/L — ABNORMAL LOW (ref 22–32)
Calcium: 9.7 mg/dL (ref 8.9–10.3)
Chloride: 107 mmol/L (ref 98–111)
Creatinine, Ser: 1.17 mg/dL (ref 0.61–1.24)
GFR, Estimated: >60 mL/min (ref >60)
Glucose, Bld: 97 mg/dL (ref 70–99)
Potassium: 4.1 mmol/L (ref 3.5–5.1)
Sodium: 139 mmol/L (ref 135–145)

## 2022-12-23 LAB — CBC
HCT: 39.9 % (ref 39.0–52.0)
Hemoglobin: 12.4 g/dL — ABNORMAL LOW (ref 13.0–17.0)
MCH: 31.6 pg (ref 26.0–34.0)
MCHC: 31.1 g/dL (ref 30.0–36.0)
MCV: 101.5 fL — ABNORMAL HIGH (ref 80.0–100.0)
Platelets: 266 10*3/uL (ref 150–400)
RBC: 3.93 MIL/uL — ABNORMAL LOW (ref 4.22–5.81)
RDW: 12.8 % (ref 11.5–15.5)
WBC: 7.7 10*3/uL (ref 4.0–10.5)
nRBC: 0 % (ref 0.0–0.2)

## 2022-12-28 ENCOUNTER — Encounter (HOSPITAL_COMMUNITY): Payer: Self-pay | Admitting: Urology

## 2022-12-28 ENCOUNTER — Ambulatory Visit (HOSPITAL_COMMUNITY)
Admission: RE | Admit: 2022-12-28 | Discharge: 2022-12-28 | Disposition: A | Discharge status: Home / Self Care | Payer: Medicare Other | Source: Ambulatory Visit | Attending: Urology | Admitting: Urology

## 2022-12-28 ENCOUNTER — Encounter (HOSPITAL_COMMUNITY)
Admission: RE | Disposition: A | Discharge status: Home / Self Care | Payer: Self-pay | Source: Ambulatory Visit | Attending: Urology

## 2022-12-28 ENCOUNTER — Ambulatory Visit (HOSPITAL_BASED_OUTPATIENT_CLINIC_OR_DEPARTMENT_OTHER): Payer: Medicare Other | Admitting: Certified Registered Nurse Anesthetist

## 2022-12-28 ENCOUNTER — Ambulatory Visit (HOSPITAL_COMMUNITY): Payer: Medicare Other | Admitting: Certified Registered Nurse Anesthetist

## 2022-12-28 ENCOUNTER — Other Ambulatory Visit: Payer: Self-pay

## 2022-12-28 DIAGNOSIS — Z9889 Other specified postprocedural states: Secondary | ICD-10-CM | POA: Diagnosis not present

## 2022-12-28 DIAGNOSIS — Z87891 Personal history of nicotine dependence: Secondary | ICD-10-CM | POA: Insufficient documentation

## 2022-12-28 DIAGNOSIS — I129 Hypertensive chronic kidney disease with stage 1 through stage 4 chronic kidney disease, or unspecified chronic kidney disease: Secondary | ICD-10-CM | POA: Diagnosis not present

## 2022-12-28 DIAGNOSIS — F1120 Opioid dependence, uncomplicated: Secondary | ICD-10-CM | POA: Insufficient documentation

## 2022-12-28 DIAGNOSIS — N319 Neuromuscular dysfunction of bladder, unspecified: Secondary | ICD-10-CM | POA: Diagnosis not present

## 2022-12-28 DIAGNOSIS — R310 Gross hematuria: Secondary | ICD-10-CM | POA: Diagnosis not present

## 2022-12-28 DIAGNOSIS — R338 Other retention of urine: Secondary | ICD-10-CM | POA: Diagnosis not present

## 2022-12-28 DIAGNOSIS — N189 Chronic kidney disease, unspecified: Secondary | ICD-10-CM | POA: Insufficient documentation

## 2022-12-28 DIAGNOSIS — G4730 Sleep apnea, unspecified: Secondary | ICD-10-CM | POA: Diagnosis not present

## 2022-12-28 DIAGNOSIS — N3281 Overactive bladder: Secondary | ICD-10-CM | POA: Diagnosis not present

## 2022-12-28 DIAGNOSIS — I10 Essential (primary) hypertension: Secondary | ICD-10-CM

## 2022-12-28 DIAGNOSIS — Z79899 Other long term (current) drug therapy: Secondary | ICD-10-CM | POA: Insufficient documentation

## 2022-12-28 HISTORY — PX: BOTOX INJECTION: SHX5754

## 2022-12-28 SURGERY — BOTOX INJECTION
Anesthesia: General

## 2022-12-28 MED ORDER — PROPOFOL 10 MG/ML IV BOLUS
INTRAVENOUS | Status: AC
  Filled 2022-12-28: qty 20

## 2022-12-28 MED ORDER — ACETAMINOPHEN 10 MG/ML IV SOLN: 1000.0000 mg | Freq: Once | INTRAVENOUS | Status: DC | PRN

## 2022-12-28 MED ORDER — LACTATED RINGERS IV SOLN: INTRAVENOUS | Status: DC | PRN

## 2022-12-28 MED ORDER — FENTANYL CITRATE (PF) 100 MCG/2ML IJ SOLN
INTRAMUSCULAR | Status: AC
  Filled 2022-12-28: qty 2

## 2022-12-28 MED ORDER — ORAL CARE MOUTH RINSE: 15.0000 mL | Freq: Once | OROMUCOSAL | Status: AC

## 2022-12-28 MED ORDER — ONDANSETRON HCL 4 MG/2ML IJ SOLN
INTRAMUSCULAR | Status: DC | PRN
  Administered 2022-12-28: 4 mg via INTRAVENOUS

## 2022-12-28 MED ORDER — PHENYLEPHRINE HCL (PRESSORS) 10 MG/ML IV SOLN
INTRAVENOUS | Status: DC | PRN
  Administered 2022-12-28: 80 ug via INTRAVENOUS
  Administered 2022-12-28: 160 ug via INTRAVENOUS
  Administered 2022-12-28: 80 ug via INTRAVENOUS

## 2022-12-28 MED ORDER — SODIUM CHLORIDE (PF) 0.9 % IJ SOLN
INTRAMUSCULAR | Status: AC
  Filled 2022-12-28: qty 20

## 2022-12-28 MED ORDER — ACETAMINOPHEN 325 MG PO TABS: 325.0000 mg | ORAL_TABLET | ORAL | Status: DC | PRN

## 2022-12-28 MED ORDER — DEXMEDETOMIDINE HCL IN NACL 80 MCG/20ML IV SOLN
INTRAVENOUS | Status: AC
  Filled 2022-12-28: qty 20

## 2022-12-28 MED ORDER — DEXAMETHASONE SODIUM PHOSPHATE 10 MG/ML IJ SOLN
INTRAMUSCULAR | Status: AC
  Filled 2022-12-28: qty 1

## 2022-12-28 MED ORDER — EPHEDRINE SULFATE (PRESSORS) 50 MG/ML IJ SOLN
INTRAMUSCULAR | Status: DC | PRN
  Administered 2022-12-28 (×2): 10 mg via INTRAVENOUS
  Administered 2022-12-28: 5 mg via INTRAVENOUS

## 2022-12-28 MED ORDER — CHLORHEXIDINE GLUCONATE 0.12 % MT SOLN
15.0000 mL | Freq: Once | OROMUCOSAL | Status: AC
  Administered 2022-12-28: 15 mL via OROMUCOSAL

## 2022-12-28 MED ORDER — PROPOFOL 10 MG/ML IV BOLUS
INTRAVENOUS | Status: DC | PRN
  Administered 2022-12-28: 20 mg via INTRAVENOUS

## 2022-12-28 MED ORDER — OXYCODONE HCL 5 MG/5ML PO SOLN: 5.0000 mg | Freq: Once | ORAL | Status: DC | PRN

## 2022-12-28 MED ORDER — FENTANYL CITRATE PF 50 MCG/ML IJ SOSY: 25.0000 ug | PREFILLED_SYRINGE | INTRAMUSCULAR | Status: DC | PRN

## 2022-12-28 MED ORDER — STERILE WATER FOR IRRIGATION IR SOLN
Status: DC | PRN
  Administered 2022-12-28: 3000 mL

## 2022-12-28 MED ORDER — DROPERIDOL 2.5 MG/ML IJ SOLN: 0.6250 mg | Freq: Once | INTRAMUSCULAR | Status: DC | PRN

## 2022-12-28 MED ORDER — ONABOTULINUMTOXINA 100 UNITS IJ SOLR
INTRAMUSCULAR | Status: AC
  Filled 2022-12-28: qty 200

## 2022-12-28 MED ORDER — EPHEDRINE 5 MG/ML INJ
INTRAVENOUS | Status: AC
  Filled 2022-12-28: qty 5

## 2022-12-28 MED ORDER — OXYCODONE HCL 5 MG PO TABS: 5.0000 mg | ORAL_TABLET | Freq: Once | ORAL | Status: DC | PRN

## 2022-12-28 MED ORDER — SODIUM CHLORIDE 0.9 % IV SOLN
1.0000 g | Freq: Once | INTRAVENOUS | Status: AC
  Administered 2022-12-28: 1 g via INTRAVENOUS
  Filled 2022-12-28: qty 20

## 2022-12-28 MED ORDER — ONDANSETRON HCL 4 MG/2ML IJ SOLN
INTRAMUSCULAR | Status: AC
  Filled 2022-12-28: qty 2

## 2022-12-28 MED ORDER — ONABOTULINUMTOXINA 100 UNITS IJ SOLR
INTRAMUSCULAR | Status: DC | PRN
  Administered 2022-12-28: 200 [IU] via INTRAMUSCULAR

## 2022-12-28 MED ORDER — ACETAMINOPHEN 160 MG/5ML PO SOLN
325.0000 mg | ORAL | Status: DC | PRN
Start: 2022-12-28 — End: 2022-12-28

## 2022-12-28 MED ORDER — FENTANYL CITRATE (PF) 100 MCG/2ML IJ SOLN
INTRAMUSCULAR | Status: DC | PRN
  Administered 2022-12-28: 50 ug via INTRAVENOUS
  Administered 2022-12-28: 25 ug via INTRAVENOUS

## 2022-12-28 MED ORDER — PHENYLEPHRINE HCL-NACL 20-0.9 MG/250ML-% IV SOLN
INTRAVENOUS | Status: DC | PRN
  Administered 2022-12-28: 40 ug/min via INTRAVENOUS

## 2022-12-28 MED ORDER — DEXAMETHASONE SODIUM PHOSPHATE 4 MG/ML IJ SOLN
INTRAMUSCULAR | Status: DC | PRN
  Administered 2022-12-28: 8 mg via INTRAVENOUS

## 2022-12-28 MED ORDER — LIDOCAINE HCL (PF) 2 % IJ SOLN
INTRAMUSCULAR | Status: AC
  Filled 2022-12-28: qty 5

## 2022-12-28 MED ORDER — SODIUM CHLORIDE (PF) 0.9 % IJ SOLN
INTRAMUSCULAR | Status: DC | PRN
Start: 2022-12-28 — End: 2022-12-28
  Administered 2022-12-28: 20 mL

## 2022-12-28 MED ORDER — ACETAMINOPHEN 10 MG/ML IV SOLN
INTRAVENOUS | Status: AC
  Filled 2022-12-28: qty 100

## 2022-12-28 SURGICAL SUPPLY — 17 items
BAG URINE DRAIN 2000ML AR STRL (UROLOGICAL SUPPLIES) IMPLANT
BAG URO CATCHER STRL LF (MISCELLANEOUS) ×1 IMPLANT
CATH FOLEY 2WAY SLVR 5CC 18FR (CATHETERS) IMPLANT
CLOTH BEACON ORANGE TIMEOUT ST (SAFETY) ×1 IMPLANT
GLOVE BIO SURGEON STRL SZ7.5 (GLOVE) ×1 IMPLANT
GOWN STRL REUS W/ TWL XL LVL3 (GOWN DISPOSABLE) ×2 IMPLANT
GOWN STRL REUS W/TWL XL LVL3 (GOWN DISPOSABLE) ×1
KIT TURNOVER KIT A (KITS) IMPLANT
MANIFOLD NEPTUNE II (INSTRUMENTS) ×1 IMPLANT
NDL ASPIRATION 22 (NEEDLE) ×1 IMPLANT
NDL SAFETY ECLIPSE 18X1.5 (NEEDLE) IMPLANT
NEEDLE ASPIRATION 22 (NEEDLE)
PACK CYSTO (CUSTOM PROCEDURE TRAY) ×1 IMPLANT
PAD PREP 24X48 CUFFED NSTRL (MISCELLANEOUS) ×1 IMPLANT
SYR CONTROL 10ML LL (SYRINGE) IMPLANT
TUBING CONNECTING 10 (TUBING) IMPLANT
WATER STERILE IRR 3000ML UROMA (IV SOLUTION) ×1 IMPLANT

## 2022-12-28 NOTE — Progress Notes (Signed)
Pharmacy Antibiotic Note  Luke Foster is a 72 y.o. male admitted on 12/28/2022.  Marland Kitchen  Pharmacy has been consulted for meropenem dosing for surgical prophylaxis  Plan: Meropenem 1 gm IV x 1 preop Pharmacy to sign off    Temp (24hrs), Avg:98.8 F (37.1 C), Min:98.8 F (37.1 C), Max:98.8 F (37.1 C)  Recent Labs  Lab 12/23/22 1051  WBC 7.7  CREATININE 1.17    Estimated Creatinine Clearance: 64.2 mL/min (by C-G formula based on SCr of 1.17 mg/dL).    Allergies  Allergen Reactions   Erythromycin Itching    Thank you for allowing pharmacy to be a part of this patient's care.  Herby Abraham, Pharm.D Use secure chat for questions 12/28/2022 7:23 AM

## 2022-12-28 NOTE — Transfer of Care (Signed)
Immediate Anesthesia Transfer of Care Note  Patient: Luke Foster  Procedure(s) Performed: CYSTOSCOPY WITH INTRA DETRUSOR BOTOX INJECTION  Patient Location: PACU  Anesthesia Type:General  Level of Consciousness: sedated  Airway & Oxygen Therapy: Patient Spontanous Breathing and Patient connected to face mask oxygen  Post-op Assessment: Report given to RN and Post -op Vital signs reviewed and stable  Post vital signs: Reviewed and stable  Last Vitals:  Vitals Value Taken Time  BP    Temp    Pulse 77 12/28/22 0932  Resp 14 12/28/22 0932  SpO2 91 % 12/28/22 0932  Vitals shown include unfiled device data.  Last Pain:  Vitals:   12/28/22 0738  TempSrc:   PainSc: 6          Complications: No notable events documented.

## 2022-12-28 NOTE — Anesthesia Procedure Notes (Signed)
Procedure Name: LMA Insertion Date/Time: 12/28/2022 9:05 AM  Performed by: Ludwig Lean, CRNAPre-anesthesia Checklist: Patient identified, Emergency Drugs available, Suction available and Patient being monitored Patient Re-evaluated:Patient Re-evaluated prior to induction Oxygen Delivery Method: Circle system utilized Preoxygenation: Pre-oxygenation with 100% oxygen Induction Type: IV induction Ventilation: Mask ventilation without difficulty LMA: LMA inserted LMA Size: 5.0 Number of attempts: 1 Placement Confirmation: positive ETCO2 and breath sounds checked- equal and bilateral Tube secured with: Tape Dental Injury: Teeth and Oropharynx as per pre-operative assessment

## 2022-12-28 NOTE — Anesthesia Preprocedure Evaluation (Addendum)
Anesthesia Evaluation  Patient identified by MRN, date of birth, ID band Patient awake    Reviewed: Allergy & Precautions, NPO status , Patient's Chart, lab work & pertinent test results  Airway Mallampati: I  TM Distance: >3 FB Neck ROM: Full    Dental  (+) Teeth Intact, Dental Advisory Given   Pulmonary sleep apnea , former smoker   breath sounds clear to auscultation       Cardiovascular hypertension, Pt. on medications  Rhythm:Regular Rate:Normal     Neuro/Psych  PSYCHIATRIC DISORDERS Anxiety Depression     Neuromuscular disease    GI/Hepatic negative GI ROS, Neg liver ROS,,,  Endo/Other  negative endocrine ROS    Renal/GU Renal disease     Musculoskeletal  (+) Arthritis ,    Abdominal   Peds  Hematology negative hematology ROS (+)   Anesthesia Other Findings   Reproductive/Obstetrics                             Anesthesia Physical Anesthesia Plan  ASA: 2  Anesthesia Plan: General   Post-op Pain Management: Tylenol PO (pre-op)*   Induction: Intravenous  PONV Risk Score and Plan: 3 and Ondansetron, Dexamethasone and Treatment may vary due to age or medical condition  Airway Management Planned: LMA  Additional Equipment: None  Intra-op Plan:   Post-operative Plan: Extubation in OR  Informed Consent: I have reviewed the patients History and Physical, chart, labs and discussed the procedure including the risks, benefits and alternatives for the proposed anesthesia with the patient or authorized representative who has indicated his/her understanding and acceptance.   Patient has DNR.   Dental advisory given  Plan Discussed with: CRNA  Anesthesia Plan Comments:        Anesthesia Quick Evaluation

## 2022-12-28 NOTE — Anesthesia Postprocedure Evaluation (Signed)
Anesthesia Post Note  Patient: Luke Foster  Procedure(s) Performed: CYSTOSCOPY WITH INTRA DETRUSOR BOTOX INJECTION     Patient location during evaluation: PACU Anesthesia Type: General Level of consciousness: awake and alert Pain management: pain level controlled Vital Signs Assessment: post-procedure vital signs reviewed and stable Respiratory status: spontaneous breathing, nonlabored ventilation, respiratory function stable and patient connected to nasal cannula oxygen Cardiovascular status: blood pressure returned to baseline and stable Postop Assessment: no apparent nausea or vomiting Anesthetic complications: no  No notable events documented.  Last Vitals:  Vitals:   12/28/22 0952 12/28/22 1023  BP: (!) 102/57 (!) 120/58  Pulse:  80  Resp:  18  Temp:  36.7 C  SpO2:  96%    Last Pain:  Vitals:   12/28/22 1023  TempSrc: Oral  PainSc: 0-No pain                 Shelton Silvas

## 2022-12-28 NOTE — Op Note (Signed)
Operative Note  Preoperative diagnosis:  1.  Detrusor overactivity  Postoperative diagnosis: 1.  Detrusor overactivity  Procedure(s): 1.  Cystoscopy with intra detrusor Botox injection, 200 units  Surgeon: Modena Slater, MD  Assistants: None  Anesthesia: General  Complications: None immediate  EBL: Minimal  Specimens: 1.  None  Drains/Catheters: 1.  Foley catheter  Intraoperative findings: 1.  Normal anterior urethra 2.  Obstructing prostate with trilobar hypertrophy 3.  Bladder mucosa without any tumors or masses.  Diffuse catheter edema.  Indication: 72 year old male with urinary retention and detrusor overactivity presents for the previously mentioned operation.  Description of procedure:  The patient was identified and consent was obtained.  The patient was taken to the operating room and placed in the supine position.  The patient was placed under general anesthesia.  Perioperative antibiotics were administered.  The patient was placed in dorsal lithotomy.  Patient was prepped and draped in a standard sterile fashion and a timeout was performed.  A rigid cystoscope was advanced into the urethra and into the bladder.  Complete cystoscopy was performed with findings noted above.  200 units of Botox was then systematically injected into the detrusor throughout the bladder.  I withdrew the scope and placed a Foley catheter.  This concluded the operation.  Patient tolerated the procedure well was stable postoperatively.  Plan: Follow-up in 6 months for repeat Botox

## 2022-12-28 NOTE — H&P (Signed)
H&P   Chief Complaint: detrusor overactivity  History of Present Illness: HPI:  72 year old male with history of urinary retention with gross hematuria. At that time, he underwent a CT without contrast on 08/15/2017. This revealed a 10 cm right renal cyst. There is no hydronephrosis or stones bilaterally. No obvious bladder tumor. He does have baseline CKD. He has not had a cystoscopy. He has been doing well on Flomax. He states that he has a good urinary stream and only nocturia 1. No further gross hematuria. Urinalysis is negative today. Patient also has significant right-sided flank pain that he believes to be secondary to a 10 cm right renal cyst.    03/24/19  Luke Foster has a hx of urinary retention, renal cysts, gross hematuria, lumbosacral radiculopathy, spinal cord stimulator implantation and removal, opioid dependence, metabolic encephalopathy, hypertension. He resides in [facility] post hospitalization from spinal stimulator removal on 03/02/19, complicated by acute post-op urinary retention, bilateral hydronephrosis and acute metabolic encephalopathy likely caused by malposition of foley in prostatic urethra and UTI. He presents today for a voiding trial. His brother brings him in today and is very involved in his care. His catheter has been in place 2 weeks. He is tolerating it well besides some slight catheter discomfort with position changes. He was having some leakage around the meatus, but that has subsided after beginning oxybutynin. He is taking oxybutynin three times a day for bladder spasms. He is also on Tamsulosin daily. He reports weakness of his lower extremities but states it is improving and he is feeling stronger daily. He denies confusion, SOB, worsening fatigue, fever, flank pain and nausea. Prior to this episode of acute retention, he was managed with tamsulosin for his LUTs and reports that he was urinating well , had a good flow to his stream and did not have any leakage,  urgency or dribbling.    03/29/19: Patient with above-noted history. He underwent successful voiding trial on 02/12; however, he did have significant bladder spasms noted at trial of void. He presents today with complaints of urinary frequency, urinary urgency, urge incontinence, and nocturia. He states that he was initially doing well following catheter removal, but irritative symptoms began approximately 3 days ago. He complains of urinary frequency of approximately every hour. He has nocturia x6-7. He notes urgency and episodes of urge incontinence, as well. He does note small volume voids, but generally feels he is emptying his bladder efficiently. He denies suprapubic pain or pressure currently. He denies gross hematuria, dysuria, fever, or chills. He does have some mild right flank pain today. He remains on tamsulosin. He is no longer using oxybutynin. He does have significant issues with constipation and is currently using MiraLax for management. He typically has a bowel movement every other day.    04/14/2019  Patient continues on tamsulosin. He continues to have significant urgency with associated urge urinary incontinence. He uses about 3 pull-ups per day. He has mild incomplete bladder emptying with a PVR of 118 cc. He was treated for urinary tract infection at the last visit. He denies any hematuria or dysuria.      11/15/2020  Patient continues to have intermittent issues with urinary retention. He has been having to have catheter changes every 2 weeks due to catheter obstruction if he waits any longer. As long as he changes it every 2 weeks he has not had much of a problem. Home health aide does irrigate his catheter every other day. This has helped tremendously.  He underwent a urodynamics study which revealed evidence of neurogenic detrusor overactivity/neurogenic bladder rather than bladder outlet obstruction. He had several uninhibited detrusor contractions and was unable to demonstrate a  voluntary contraction.    05/05/2021  Patient has tried anticholinergics and was prescribed Myrbetriq but could not afford this. Despite this, he continues to have a lot of leakage around the catheter likely secondary to bladder spasms. This is quite troublesome and bothersome to him. He wanted to discuss different options from here.    09/15/2021: Luke Foster is a 72 year old Foster who presents today for concerns of Foley catheter which continues to fall out of his urethra. He and his family are here to discuss this further. He is currently on Myrbetriq and Solifenacin. He is tolerating the catheter well.    12/Luke/23: 72 year old Foster who presents today for stent removal. He underwent a right URS on 01/05/22 for a 7mm right ureteral stone. He has been tolerating the stent well but endorses blood in his urine. He has a foley cathete rin place as well. He is not having any pain r fevers.    03/16/22: Luke Foster presents today with concerns of urinary leakage and bladder spasms. He is foley catheter dependent. He is tolerating the catheter well. He has not complaints of gross blood or fevers. HE has been managed well with botox in the past ans would like to get that set up.    12/28/2022 Patient presents today for cystoscopy with intra detrusor Botox injection  Past Medical History:  Diagnosis Date   Arthritis    Chronic back pain    Chronic pain 1999   Bilateral feet (R >L)   Difficult or painful urination    High blood pressure    History of kidney stones    H/O   Nerve damage    Pain management    Paresthesia of foot, bilateral    Renal disorder    Sleep apnea    USES CPAP   Weakness of both legs    Past Surgical History:  Procedure Laterality Date   arm surgery     BACK SURGERY     X2   BOTOX INJECTION N/A 10/06/2021   Procedure: CYSTOSCOPY LITHOLAPAXY WITH BOTOX INJECTION;  Surgeon: Crista Elliot, MD;  Location: [facility];  Service: Urology;  Laterality: N/A;   BOTOX INJECTION N/A  04/20/2022   Procedure: CYSTOSCOPY & BOTOX INJECTION, BLADDER FULGARATION;  Surgeon: Crista Elliot, MD;  Location: [facility];  Service: Urology;  Laterality: N/A;  45 MINS   BOWEL RESECTION N/A 01/15/2022   Procedure: SMALL BOWEL RESECTION;  Surgeon: Lewie Chamber, DO;  Location: [facility];  Service: General;  Laterality: N/A;   CYSTOSCOPY W/ URETERAL STENT PLACEMENT Right 11/29/2021   Procedure: CYSTOSCOPY WITH RETROGRADE PYELOGRAM/URETERAL STENT PLACEMENT;  Surgeon: Bjorn Pippin, MD;  Location: [facility];  Service: Urology;  Laterality: Right;   CYSTOSCOPY/URETEROSCOPY/HOLMIUM LASER/STENT PLACEMENT Right 01/05/2022   Procedure: CYSTOSCOPY RIGHT URETEROSCOPY/HOLMIUM LASER/STENT PLACEMENT basket extraction stones;  Surgeon: Crista Elliot, MD;  Location: [facility];  Service: Urology;  Laterality: Right;  1 HR FOR CASE   FACIAL COSMETIC SURGERY     HERNIA REPAIR     IR US GUIDE BX ASP/DRAIN  10/19/2017   KIDNEY STONE SURGERY     LAPAROTOMY N/A 01/15/2022   Procedure: Exploratory laparotomy;  Surgeon: Lewie Chamber, DO;  Location: [facility];  Service: General;  Laterality: N/A;   LUMBAR SPINAL CORD SIMULATOR LEAD  REMOVAL N/A 03/01/2019   Procedure: LUMBAR SPINAL CORD SIMULATOR LEAD REMOVAL;  Surgeon: Venetia Night, MD;  Location: [facility];  Service: Neurosurgery;  Laterality: N/A;   MASTECTOMY Left    SPINAL CORD STIMULATOR REMOVAL N/A 03/01/2019   Procedure: LUMBAR SPINAL CORD STIMULATOR REMOVAL;  Surgeon: Venetia Night, MD;  Location: [facility];  Service: Neurosurgery;  Laterality: N/A;   WISDOM TOOTH EXTRACTION      Home Medications:  Medications Prior to Admission  Medication Sig Dispense Refill Last Dose   acetaminophen (TYLENOL) 500 MG tablet Take 1,000 mg by mouth every 6 (six) hours as needed for mild pain or moderate pain.   12/27/2022   ascorbic acid (VITAMIN C) 500 MG tablet Take 500 mg by mouth daily.   12/27/2022   Cholecalciferol (VITAMIN D) 50 MCG (2000 UT)  CAPS Take 2,000 Units by mouth daily.   12/27/2022   cyanocobalamin (VITAMIN B12) 1000 MCG tablet Take 1,000 mcg by mouth in the morning and at bedtime.   12/27/2022   docusate sodium (COLACE) 100 MG capsule Take 1 capsule (100 mg total) by mouth in the morning, at noon, and at bedtime. (Patient taking differently: Take 300 mg by mouth at bedtime.) 90 capsule 0 Past Week   enalapril (VASOTEC) 5 MG tablet Take 5 mg by mouth 2 (two) times daily.   12/27/2022   lidocaine (LIDODERM) 5 % Place 1 patch onto the skin daily as needed (pain).   12/27/2022   mirabegron ER (MYRBETRIQ) 50 MG TB24 tablet Take 1 tablet (50 mg total) by mouth daily. (Patient taking differently: Take 50 mg by mouth every evening.) 30 tablet 0 Past Week   NON FORMULARY Pt uses a cpap at night   12/27/2022   oxyCODONE (ROXICODONE) 15 MG immediate release tablet Take 1 tablet (15 mg total) by mouth every 6 (six) hours as needed for pain. 30 tablet 0 Past Week   rOPINIRole (REQUIP) 0.5 MG tablet Take 0.5 mg by mouth at bedtime as needed (restless legs).   12/27/2022   solifenacin (VESICARE) 5 MG tablet Take 5 mg by mouth daily.   Past Week   sulfamethoxazole-trimethoprim (BACTRIM) 400-80 MG tablet Take 1 tablet by mouth at bedtime.   not taking   tamsulosin (FLOMAX) 0.4 MG CAPS capsule Take 1 capsule (0.4 mg total) by mouth every evening. (Patient taking differently: Take 0.4 mg by mouth at bedtime.) 30 capsule 0 12/27/2022   venlafaxine XR (EFFEXOR-XR) 75 MG 24 hr capsule Take 1 capsule (75 mg total) by mouth in the morning and at bedtime. 60 capsule 0 12/27/2022   cholecalciferol (VITAMIN D3) 25 MCG (1000 UNIT) tablet Take 2 tablets (2,000 Units total) by mouth daily. (Patient not taking: Reported on 12/18/2022)   Not Taking   cyanocobalamin (VITAMIN B12) 500 MCG tablet Take 2 tablets (1,000 mcg total) by mouth 2 (two) times daily. (Patient not taking: Reported on 12/18/2022)   Not Taking   DILT-XR 120 MG 24 hr capsule Take 120 mg by  mouth daily.   Unknown   diltiazem (CARDIZEM CD) 120 MG 24 hr capsule Take 1 capsule (120 mg total) by mouth every evening. (Patient not taking: Reported on 12/18/2022) 30 capsule 0 Not Taking   ondansetron (ZOFRAN) 4 MG tablet Take 1 tablet (4 mg total) by mouth every 8 (eight) hours as needed for nausea or vomiting. (Patient not taking: Reported on 12/18/2022) 30 tablet 0 Not Taking   polyethylene glycol (MIRALAX / GLYCOLAX) 17 g packet Take 17 g  by mouth 2 (two) times daily. (Patient not taking: Reported on 12/18/2022) 14 each 3 Not Taking   Allergies:  Allergies  Allergen Reactions   Erythromycin Itching    Family History  Problem Relation Age of Onset   Heart disease Father        Living, 41   Breast cancer Mother        Living, 5   Hypercholesterolemia Mother    Healthy Brother    Healthy Sister    Social History:  reports that he quit smoking about 31 years ago. His smoking use included cigarettes. He started smoking about 41 years ago. He has a 20 pack-year smoking history. He quit smokeless tobacco use about 31 years ago. He reports current alcohol use. He reports that he does not use drugs.  ROS: A complete review of systems was performed.  All systems are negative except for pertinent findings as noted. ROS   Physical Exam:  Vital signs in last 24 hours: Temp:  [98.8 F (37.1 C)] 98.8 F (37.1 C) (11/18 0650) Pulse Rate:  [83] 83 (11/18 0650) Resp:  [18] 18 (11/18 0650) BP: (94)/(80) 94/80 (11/18 0650) SpO2:  [97 %] 97 % (11/18 0650) Weight:  [90.7 kg] 90.7 kg (11/18 0738) General:  Alert and oriented, No acute distress HEENT: Normocephalic, atraumatic Neck: No JVD or lymphadenopathy Cardiovascular: Regular rate and rhythm Lungs: Regular rate and effort Abdomen: Soft, nontender, nondistended, no abdominal masses Back: No CVA tenderness Extremities: No edema Neurologic: Grossly intact  Laboratory Data:  No results found for this or any previous visit (from the  past 24 hour(s)). No results found for this or any previous visit (from the past 240 hour(s)). Creatinine: Recent Labs    12/23/22 1051  CREATININE 1.17    ASSESSMENT:      ICD-10 Details  1 GU:   BPH w/LUTS -urinary retention 2   Detrusor overactivity - N31.1 Chronic, Stable      PLAN:    Proceed with cystoscopy with intravesical Botox 200 units   Ray Church, III 12/28/2022, 8:29 AM

## 2022-12-29 ENCOUNTER — Encounter (HOSPITAL_COMMUNITY): Payer: Self-pay | Admitting: Urology

## 2022-12-31 DIAGNOSIS — M5417 Radiculopathy, lumbosacral region: Secondary | ICD-10-CM | POA: Diagnosis not present

## 2022-12-31 DIAGNOSIS — Z466 Encounter for fitting and adjustment of urinary device: Secondary | ICD-10-CM | POA: Diagnosis not present

## 2022-12-31 DIAGNOSIS — I129 Hypertensive chronic kidney disease with stage 1 through stage 4 chronic kidney disease, or unspecified chronic kidney disease: Secondary | ICD-10-CM | POA: Diagnosis not present

## 2022-12-31 DIAGNOSIS — N319 Neuromuscular dysfunction of bladder, unspecified: Secondary | ICD-10-CM | POA: Diagnosis not present

## 2022-12-31 DIAGNOSIS — I700 Atherosclerosis of aorta: Secondary | ICD-10-CM | POA: Diagnosis not present

## 2023-01-22 DIAGNOSIS — G894 Chronic pain syndrome: Secondary | ICD-10-CM | POA: Diagnosis not present

## 2023-01-22 DIAGNOSIS — M5417 Radiculopathy, lumbosacral region: Secondary | ICD-10-CM | POA: Diagnosis not present

## 2023-01-22 DIAGNOSIS — M5134 Other intervertebral disc degeneration, thoracic region: Secondary | ICD-10-CM | POA: Diagnosis not present

## 2023-01-23 DIAGNOSIS — N319 Neuromuscular dysfunction of bladder, unspecified: Secondary | ICD-10-CM | POA: Diagnosis not present

## 2023-01-23 DIAGNOSIS — M5417 Radiculopathy, lumbosacral region: Secondary | ICD-10-CM | POA: Diagnosis not present

## 2023-01-23 DIAGNOSIS — I129 Hypertensive chronic kidney disease with stage 1 through stage 4 chronic kidney disease, or unspecified chronic kidney disease: Secondary | ICD-10-CM | POA: Diagnosis not present

## 2023-01-23 DIAGNOSIS — I700 Atherosclerosis of aorta: Secondary | ICD-10-CM | POA: Diagnosis not present

## 2023-01-23 DIAGNOSIS — Z466 Encounter for fitting and adjustment of urinary device: Secondary | ICD-10-CM | POA: Diagnosis not present

## 2023-01-28 DIAGNOSIS — Z466 Encounter for fitting and adjustment of urinary device: Secondary | ICD-10-CM | POA: Diagnosis not present

## 2023-01-28 DIAGNOSIS — I129 Hypertensive chronic kidney disease with stage 1 through stage 4 chronic kidney disease, or unspecified chronic kidney disease: Secondary | ICD-10-CM | POA: Diagnosis not present

## 2023-01-28 DIAGNOSIS — M5417 Radiculopathy, lumbosacral region: Secondary | ICD-10-CM | POA: Diagnosis not present

## 2023-01-28 DIAGNOSIS — N319 Neuromuscular dysfunction of bladder, unspecified: Secondary | ICD-10-CM | POA: Diagnosis not present

## 2023-01-28 DIAGNOSIS — I700 Atherosclerosis of aorta: Secondary | ICD-10-CM | POA: Diagnosis not present

## 2023-01-30 DIAGNOSIS — N319 Neuromuscular dysfunction of bladder, unspecified: Secondary | ICD-10-CM | POA: Diagnosis not present

## 2023-01-30 DIAGNOSIS — I129 Hypertensive chronic kidney disease with stage 1 through stage 4 chronic kidney disease, or unspecified chronic kidney disease: Secondary | ICD-10-CM | POA: Diagnosis not present

## 2023-01-30 DIAGNOSIS — I700 Atherosclerosis of aorta: Secondary | ICD-10-CM | POA: Diagnosis not present

## 2023-01-30 DIAGNOSIS — M5417 Radiculopathy, lumbosacral region: Secondary | ICD-10-CM | POA: Diagnosis not present

## 2023-01-30 DIAGNOSIS — Z466 Encounter for fitting and adjustment of urinary device: Secondary | ICD-10-CM | POA: Diagnosis not present

## 2023-02-22 DIAGNOSIS — I129 Hypertensive chronic kidney disease with stage 1 through stage 4 chronic kidney disease, or unspecified chronic kidney disease: Secondary | ICD-10-CM | POA: Diagnosis not present

## 2023-02-22 DIAGNOSIS — Z466 Encounter for fitting and adjustment of urinary device: Secondary | ICD-10-CM | POA: Diagnosis not present

## 2023-02-22 DIAGNOSIS — I700 Atherosclerosis of aorta: Secondary | ICD-10-CM | POA: Diagnosis not present

## 2023-02-22 DIAGNOSIS — M5417 Radiculopathy, lumbosacral region: Secondary | ICD-10-CM | POA: Diagnosis not present

## 2023-02-22 DIAGNOSIS — N319 Neuromuscular dysfunction of bladder, unspecified: Secondary | ICD-10-CM | POA: Diagnosis not present

## 2023-03-04 DIAGNOSIS — M5134 Other intervertebral disc degeneration, thoracic region: Secondary | ICD-10-CM | POA: Diagnosis not present

## 2023-03-04 DIAGNOSIS — N319 Neuromuscular dysfunction of bladder, unspecified: Secondary | ICD-10-CM | POA: Diagnosis not present

## 2023-03-04 DIAGNOSIS — G894 Chronic pain syndrome: Secondary | ICD-10-CM | POA: Diagnosis not present

## 2023-03-04 DIAGNOSIS — G2581 Restless legs syndrome: Secondary | ICD-10-CM | POA: Diagnosis not present

## 2023-03-07 DIAGNOSIS — N319 Neuromuscular dysfunction of bladder, unspecified: Secondary | ICD-10-CM | POA: Diagnosis not present

## 2023-03-07 DIAGNOSIS — Z466 Encounter for fitting and adjustment of urinary device: Secondary | ICD-10-CM | POA: Diagnosis not present

## 2023-03-07 DIAGNOSIS — M5417 Radiculopathy, lumbosacral region: Secondary | ICD-10-CM | POA: Diagnosis not present

## 2023-03-07 DIAGNOSIS — I129 Hypertensive chronic kidney disease with stage 1 through stage 4 chronic kidney disease, or unspecified chronic kidney disease: Secondary | ICD-10-CM | POA: Diagnosis not present

## 2023-03-07 DIAGNOSIS — I700 Atherosclerosis of aorta: Secondary | ICD-10-CM | POA: Diagnosis not present

## 2023-03-11 DIAGNOSIS — R338 Other retention of urine: Secondary | ICD-10-CM | POA: Diagnosis not present

## 2023-03-30 DIAGNOSIS — I700 Atherosclerosis of aorta: Secondary | ICD-10-CM | POA: Diagnosis not present

## 2023-03-30 DIAGNOSIS — Z466 Encounter for fitting and adjustment of urinary device: Secondary | ICD-10-CM | POA: Diagnosis not present

## 2023-03-30 DIAGNOSIS — I129 Hypertensive chronic kidney disease with stage 1 through stage 4 chronic kidney disease, or unspecified chronic kidney disease: Secondary | ICD-10-CM | POA: Diagnosis not present

## 2023-03-30 DIAGNOSIS — N319 Neuromuscular dysfunction of bladder, unspecified: Secondary | ICD-10-CM | POA: Diagnosis not present

## 2023-03-30 DIAGNOSIS — M5417 Radiculopathy, lumbosacral region: Secondary | ICD-10-CM | POA: Diagnosis not present

## 2023-04-13 DIAGNOSIS — G894 Chronic pain syndrome: Secondary | ICD-10-CM | POA: Diagnosis not present

## 2023-04-13 DIAGNOSIS — M5417 Radiculopathy, lumbosacral region: Secondary | ICD-10-CM | POA: Diagnosis not present

## 2023-04-13 DIAGNOSIS — M4800 Spinal stenosis, site unspecified: Secondary | ICD-10-CM | POA: Diagnosis not present

## 2023-04-13 DIAGNOSIS — G2581 Restless legs syndrome: Secondary | ICD-10-CM | POA: Diagnosis not present

## 2023-04-13 DIAGNOSIS — N319 Neuromuscular dysfunction of bladder, unspecified: Secondary | ICD-10-CM | POA: Diagnosis not present

## 2023-04-13 DIAGNOSIS — Z466 Encounter for fitting and adjustment of urinary device: Secondary | ICD-10-CM | POA: Diagnosis not present

## 2023-04-19 ENCOUNTER — Encounter (HOSPITAL_COMMUNITY): Payer: Self-pay | Admitting: *Deleted

## 2023-04-19 ENCOUNTER — Emergency Department (HOSPITAL_COMMUNITY)

## 2023-04-19 ENCOUNTER — Other Ambulatory Visit: Payer: Self-pay

## 2023-04-19 ENCOUNTER — Inpatient Hospital Stay (HOSPITAL_COMMUNITY)
Admission: EM | Admit: 2023-04-19 | Discharge: 2023-05-01 | DRG: 660 | Disposition: A | Discharge status: Home Health | Attending: Internal Medicine | Admitting: Internal Medicine

## 2023-04-19 DIAGNOSIS — R339 Retention of urine, unspecified: Secondary | ICD-10-CM | POA: Diagnosis present

## 2023-04-19 DIAGNOSIS — K565 Intestinal adhesions [bands], unspecified as to partial versus complete obstruction: Secondary | ICD-10-CM | POA: Diagnosis not present

## 2023-04-19 DIAGNOSIS — M51369 Other intervertebral disc degeneration, lumbar region without mention of lumbar back pain or lower extremity pain: Secondary | ICD-10-CM | POA: Diagnosis present

## 2023-04-19 DIAGNOSIS — E872 Acidosis, unspecified: Secondary | ICD-10-CM | POA: Diagnosis not present

## 2023-04-19 DIAGNOSIS — D638 Anemia in other chronic diseases classified elsewhere: Secondary | ICD-10-CM | POA: Diagnosis not present

## 2023-04-19 DIAGNOSIS — Z9012 Acquired absence of left breast and nipple: Secondary | ICD-10-CM

## 2023-04-19 DIAGNOSIS — F419 Anxiety disorder, unspecified: Secondary | ICD-10-CM | POA: Diagnosis present

## 2023-04-19 DIAGNOSIS — G4730 Sleep apnea, unspecified: Secondary | ICD-10-CM | POA: Diagnosis not present

## 2023-04-19 DIAGNOSIS — N2889 Other specified disorders of kidney and ureter: Secondary | ICD-10-CM | POA: Diagnosis present

## 2023-04-19 DIAGNOSIS — R509 Fever, unspecified: Secondary | ICD-10-CM | POA: Diagnosis not present

## 2023-04-19 DIAGNOSIS — R0902 Hypoxemia: Secondary | ICD-10-CM | POA: Diagnosis not present

## 2023-04-19 DIAGNOSIS — Z881 Allergy status to other antibiotic agents status: Secondary | ICD-10-CM | POA: Diagnosis not present

## 2023-04-19 DIAGNOSIS — F418 Other specified anxiety disorders: Secondary | ICD-10-CM | POA: Diagnosis not present

## 2023-04-19 DIAGNOSIS — N200 Calculus of kidney: Secondary | ICD-10-CM | POA: Diagnosis not present

## 2023-04-19 DIAGNOSIS — G894 Chronic pain syndrome: Secondary | ICD-10-CM | POA: Diagnosis present

## 2023-04-19 DIAGNOSIS — Z452 Encounter for adjustment and management of vascular access device: Secondary | ICD-10-CM | POA: Diagnosis not present

## 2023-04-19 DIAGNOSIS — Z8249 Family history of ischemic heart disease and other diseases of the circulatory system: Secondary | ICD-10-CM | POA: Diagnosis not present

## 2023-04-19 DIAGNOSIS — F0394 Unspecified dementia, unspecified severity, with anxiety: Secondary | ICD-10-CM | POA: Diagnosis present

## 2023-04-19 DIAGNOSIS — N401 Enlarged prostate with lower urinary tract symptoms: Secondary | ICD-10-CM | POA: Diagnosis present

## 2023-04-19 DIAGNOSIS — Z9689 Presence of other specified functional implants: Secondary | ICD-10-CM

## 2023-04-19 DIAGNOSIS — D509 Iron deficiency anemia, unspecified: Secondary | ICD-10-CM | POA: Diagnosis present

## 2023-04-19 DIAGNOSIS — N132 Hydronephrosis with renal and ureteral calculous obstruction: Secondary | ICD-10-CM | POA: Diagnosis not present

## 2023-04-19 DIAGNOSIS — E876 Hypokalemia: Secondary | ICD-10-CM | POA: Diagnosis not present

## 2023-04-19 DIAGNOSIS — J9811 Atelectasis: Secondary | ICD-10-CM | POA: Diagnosis not present

## 2023-04-19 DIAGNOSIS — Z83438 Family history of other disorder of lipoprotein metabolism and other lipidemia: Secondary | ICD-10-CM

## 2023-04-19 DIAGNOSIS — Z87442 Personal history of urinary calculi: Secondary | ICD-10-CM

## 2023-04-19 DIAGNOSIS — Z79899 Other long term (current) drug therapy: Secondary | ICD-10-CM

## 2023-04-19 DIAGNOSIS — Z9181 History of falling: Secondary | ICD-10-CM

## 2023-04-19 DIAGNOSIS — Z9682 Presence of neurostimulator: Secondary | ICD-10-CM

## 2023-04-19 DIAGNOSIS — R000 Tachycardia, unspecified: Secondary | ICD-10-CM | POA: Diagnosis not present

## 2023-04-19 DIAGNOSIS — Z4682 Encounter for fitting and adjustment of non-vascular catheter: Secondary | ICD-10-CM | POA: Diagnosis not present

## 2023-04-19 DIAGNOSIS — G2581 Restless legs syndrome: Secondary | ICD-10-CM | POA: Diagnosis not present

## 2023-04-19 DIAGNOSIS — R1084 Generalized abdominal pain: Secondary | ICD-10-CM | POA: Diagnosis not present

## 2023-04-19 DIAGNOSIS — Z87891 Personal history of nicotine dependence: Secondary | ICD-10-CM | POA: Diagnosis not present

## 2023-04-19 DIAGNOSIS — I10 Essential (primary) hypertension: Secondary | ICD-10-CM | POA: Diagnosis present

## 2023-04-19 DIAGNOSIS — N139 Obstructive and reflux uropathy, unspecified: Secondary | ICD-10-CM | POA: Diagnosis present

## 2023-04-19 DIAGNOSIS — Z4789 Encounter for other orthopedic aftercare: Secondary | ICD-10-CM | POA: Diagnosis not present

## 2023-04-19 DIAGNOSIS — N136 Pyonephrosis: Secondary | ICD-10-CM | POA: Diagnosis not present

## 2023-04-19 DIAGNOSIS — K8020 Calculus of gallbladder without cholecystitis without obstruction: Secondary | ICD-10-CM | POA: Diagnosis not present

## 2023-04-19 DIAGNOSIS — N12 Tubulo-interstitial nephritis, not specified as acute or chronic: Secondary | ICD-10-CM | POA: Diagnosis not present

## 2023-04-19 DIAGNOSIS — K56609 Unspecified intestinal obstruction, unspecified as to partial versus complete obstruction: Secondary | ICD-10-CM | POA: Diagnosis not present

## 2023-04-19 DIAGNOSIS — Z743 Need for continuous supervision: Secondary | ICD-10-CM | POA: Diagnosis not present

## 2023-04-19 DIAGNOSIS — Z981 Arthrodesis status: Secondary | ICD-10-CM | POA: Diagnosis not present

## 2023-04-19 DIAGNOSIS — R6889 Other general symptoms and signs: Secondary | ICD-10-CM | POA: Diagnosis not present

## 2023-04-19 DIAGNOSIS — E785 Hyperlipidemia, unspecified: Secondary | ICD-10-CM | POA: Diagnosis not present

## 2023-04-19 DIAGNOSIS — G629 Polyneuropathy, unspecified: Secondary | ICD-10-CM | POA: Diagnosis not present

## 2023-04-19 DIAGNOSIS — R7989 Other specified abnormal findings of blood chemistry: Secondary | ICD-10-CM | POA: Diagnosis present

## 2023-04-19 DIAGNOSIS — N179 Acute kidney failure, unspecified: Principal | ICD-10-CM | POA: Diagnosis present

## 2023-04-19 DIAGNOSIS — N134 Hydroureter: Secondary | ICD-10-CM | POA: Diagnosis not present

## 2023-04-19 DIAGNOSIS — M4854XA Collapsed vertebra, not elsewhere classified, thoracic region, initial encounter for fracture: Secondary | ICD-10-CM | POA: Diagnosis present

## 2023-04-19 DIAGNOSIS — R5381 Other malaise: Secondary | ICD-10-CM | POA: Diagnosis present

## 2023-04-19 DIAGNOSIS — Z79891 Long term (current) use of opiate analgesic: Secondary | ICD-10-CM | POA: Diagnosis not present

## 2023-04-19 DIAGNOSIS — F32A Depression, unspecified: Secondary | ICD-10-CM | POA: Diagnosis present

## 2023-04-19 DIAGNOSIS — N281 Cyst of kidney, acquired: Secondary | ICD-10-CM | POA: Diagnosis not present

## 2023-04-19 DIAGNOSIS — Z87448 Personal history of other diseases of urinary system: Secondary | ICD-10-CM | POA: Diagnosis not present

## 2023-04-19 DIAGNOSIS — M47816 Spondylosis without myelopathy or radiculopathy, lumbar region: Secondary | ICD-10-CM | POA: Diagnosis not present

## 2023-04-19 DIAGNOSIS — E538 Deficiency of other specified B group vitamins: Secondary | ICD-10-CM | POA: Diagnosis not present

## 2023-04-19 DIAGNOSIS — R140 Abdominal distension (gaseous): Secondary | ICD-10-CM | POA: Diagnosis not present

## 2023-04-19 DIAGNOSIS — Z960 Presence of urogenital implants: Secondary | ICD-10-CM | POA: Diagnosis not present

## 2023-04-19 DIAGNOSIS — Z803 Family history of malignant neoplasm of breast: Secondary | ICD-10-CM

## 2023-04-19 DIAGNOSIS — K560 Paralytic ileus: Secondary | ICD-10-CM | POA: Diagnosis not present

## 2023-04-19 DIAGNOSIS — F0390 Unspecified dementia without behavioral disturbance: Secondary | ICD-10-CM | POA: Diagnosis not present

## 2023-04-19 DIAGNOSIS — N1330 Unspecified hydronephrosis: Secondary | ICD-10-CM | POA: Diagnosis not present

## 2023-04-19 DIAGNOSIS — N201 Calculus of ureter: Secondary | ICD-10-CM | POA: Diagnosis not present

## 2023-04-19 DIAGNOSIS — Z419 Encounter for procedure for purposes other than remedying health state, unspecified: Secondary | ICD-10-CM

## 2023-04-19 DIAGNOSIS — K828 Other specified diseases of gallbladder: Secondary | ICD-10-CM | POA: Diagnosis not present

## 2023-04-19 DIAGNOSIS — K567 Ileus, unspecified: Secondary | ICD-10-CM | POA: Diagnosis not present

## 2023-04-19 DIAGNOSIS — A419 Sepsis, unspecified organism: Secondary | ICD-10-CM | POA: Diagnosis not present

## 2023-04-19 DIAGNOSIS — N202 Calculus of kidney with calculus of ureter: Secondary | ICD-10-CM | POA: Diagnosis not present

## 2023-04-19 DIAGNOSIS — M549 Dorsalgia, unspecified: Secondary | ICD-10-CM | POA: Diagnosis not present

## 2023-04-19 DIAGNOSIS — R0989 Other specified symptoms and signs involving the circulatory and respiratory systems: Secondary | ICD-10-CM | POA: Diagnosis not present

## 2023-04-19 DIAGNOSIS — N210 Calculus in bladder: Secondary | ICD-10-CM | POA: Diagnosis present

## 2023-04-19 DIAGNOSIS — K838 Other specified diseases of biliary tract: Secondary | ICD-10-CM | POA: Diagnosis not present

## 2023-04-19 DIAGNOSIS — N3289 Other specified disorders of bladder: Secondary | ICD-10-CM | POA: Diagnosis not present

## 2023-04-19 DIAGNOSIS — R338 Other retention of urine: Secondary | ICD-10-CM | POA: Diagnosis present

## 2023-04-19 HISTORY — DX: Unspecified dementia, unspecified severity, without behavioral disturbance, psychotic disturbance, mood disturbance, and anxiety: F03.90

## 2023-04-19 LAB — COMPREHENSIVE METABOLIC PANEL
ALT: 22 U/L (ref 0–44)
AST: 21 U/L (ref 15–41)
Albumin: 3.2 g/dL — ABNORMAL LOW (ref 3.5–5.0)
Alkaline Phosphatase: 85 U/L (ref 38–126)
Anion gap: 14 (ref 5–15)
BUN: 39 mg/dL — ABNORMAL HIGH (ref 8–23)
CO2: 16 mmol/L — ABNORMAL LOW (ref 22–32)
Calcium: 10 mg/dL (ref 8.9–10.3)
Chloride: 105 mmol/L (ref 98–111)
Creatinine, Ser: 1.88 mg/dL — ABNORMAL HIGH (ref 0.61–1.24)
GFR, Estimated: 38 mL/min — ABNORMAL LOW (ref >60)
Glucose, Bld: 127 mg/dL — ABNORMAL HIGH (ref 70–99)
Potassium: 4.1 mmol/L (ref 3.5–5.1)
Sodium: 135 mmol/L (ref 135–145)
Total Bilirubin: 0.6 mg/dL (ref 0.0–1.2)
Total Protein: 8.1 g/dL (ref 6.5–8.1)

## 2023-04-19 LAB — CBC WITH DIFFERENTIAL/PLATELET
Abs Immature Granulocytes: 0.09 10*3/uL — ABNORMAL HIGH (ref 0.00–0.07)
Basophils Absolute: 0 10*3/uL (ref 0.0–0.1)
Basophils Relative: 0 %
Eosinophils Absolute: 0 10*3/uL (ref 0.0–0.5)
Eosinophils Relative: 0 %
HCT: 35.9 % — ABNORMAL LOW (ref 39.0–52.0)
Hemoglobin: 11.5 g/dL — ABNORMAL LOW (ref 13.0–17.0)
Immature Granulocytes: 1 %
Lymphocytes Relative: 4 %
Lymphs Abs: 0.5 10*3/uL — ABNORMAL LOW (ref 0.7–4.0)
MCH: 31.2 pg (ref 26.0–34.0)
MCHC: 32 g/dL (ref 30.0–36.0)
MCV: 97.3 fL (ref 80.0–100.0)
Monocytes Absolute: 0.5 10*3/uL (ref 0.1–1.0)
Monocytes Relative: 5 %
Neutro Abs: 9.7 10*3/uL — ABNORMAL HIGH (ref 1.7–7.7)
Neutrophils Relative %: 90 %
Platelets: 288 10*3/uL (ref 150–400)
RBC: 3.69 MIL/uL — ABNORMAL LOW (ref 4.22–5.81)
RDW: 14.1 % (ref 11.5–15.5)
WBC: 10.9 10*3/uL — ABNORMAL HIGH (ref 4.0–10.5)
nRBC: 0 % (ref 0.0–0.2)

## 2023-04-19 LAB — URINALYSIS, W/ REFLEX TO CULTURE (INFECTION SUSPECTED)
Bilirubin Urine: NEGATIVE
Glucose, UA: NEGATIVE mg/dL
Ketones, ur: NEGATIVE mg/dL
Nitrite: NEGATIVE
Protein, ur: 100 mg/dL — AB
RBC / HPF: >50 RBC/hpf (ref 0–5)
Specific Gravity, Urine: 1.026 (ref 1.005–1.030)
WBC, UA: >50 WBC/hpf (ref 0–5)
pH: 5 (ref 5.0–8.0)

## 2023-04-19 LAB — BLOOD GAS, VENOUS
Acid-base deficit: 5.2 mmol/L — ABNORMAL HIGH (ref 0.0–2.0)
Bicarbonate: 20 mmol/L (ref 20.0–28.0)
Drawn by: 7012
O2 Saturation: 44.1 %
Patient temperature: 37.1
pCO2, Ven: 37 mmHg — ABNORMAL LOW (ref 44–60)
pH, Ven: 7.34 (ref 7.25–7.43)
pO2, Ven: <31 mmHg — CL (ref 32–45)

## 2023-04-19 LAB — TROPONIN I (HIGH SENSITIVITY): Troponin I (High Sensitivity): 7 ng/L (ref <18)

## 2023-04-19 LAB — BETA-HYDROXYBUTYRIC ACID: Beta-Hydroxybutyric Acid: 0.35 mmol/L — ABNORMAL HIGH (ref 0.05–0.27)

## 2023-04-19 LAB — LACTIC ACID, PLASMA
Lactic Acid, Venous: 1.7 mmol/L (ref 0.5–1.9)
Lactic Acid, Venous: 1.9 mmol/L (ref 0.5–1.9)

## 2023-04-19 MED ORDER — HYDROMORPHONE HCL 1 MG/ML IJ SOLN
0.5000 mg | Freq: Once | INTRAMUSCULAR | Status: AC
  Administered 2023-04-19: 0.5 mg via INTRAVENOUS
  Filled 2023-04-19: qty 0.5

## 2023-04-19 MED ORDER — ENALAPRIL MALEATE 2.5 MG PO TABS
5.0000 mg | ORAL_TABLET | Freq: Two times a day (BID) | ORAL | Status: DC
Start: 2023-04-19 — End: 2023-04-20
  Administered 2023-04-19: 5 mg via ORAL
  Filled 2023-04-19 (×2): qty 2

## 2023-04-19 MED ORDER — OXYCODONE HCL 5 MG PO TABS
15.0000 mg | ORAL_TABLET | Freq: Once | ORAL | Status: AC
  Administered 2023-04-19: 15 mg via ORAL
  Filled 2023-04-19: qty 3

## 2023-04-19 MED ORDER — TAMSULOSIN HCL 0.4 MG PO CAPS
0.4000 mg | ORAL_CAPSULE | Freq: Every evening | ORAL | Status: DC
  Administered 2023-04-19 – 2023-04-22 (×4): 0.4 mg via ORAL
  Filled 2023-04-19 (×5): qty 1

## 2023-04-19 MED ORDER — DOCUSATE SODIUM 100 MG PO CAPS
300.0000 mg | ORAL_CAPSULE | Freq: Every day | ORAL | Status: DC
  Administered 2023-04-20 – 2023-04-22 (×3): 300 mg via ORAL
  Filled 2023-04-19 (×3): qty 3

## 2023-04-19 MED ORDER — OXYCODONE HCL 5 MG PO TABS
15.0000 mg | ORAL_TABLET | ORAL | Status: DC | PRN
  Administered 2023-04-20 – 2023-05-01 (×22): 15 mg via ORAL
  Filled 2023-04-19 (×23): qty 3

## 2023-04-19 MED ORDER — SODIUM CHLORIDE 0.9 % IV SOLN
1.0000 g | Freq: Once | INTRAVENOUS | Status: AC
  Administered 2023-04-19: 1 g via INTRAVENOUS
  Filled 2023-04-19: qty 10

## 2023-04-19 MED ORDER — LIDOCAINE 5 % EX PTCH
1.0000 | MEDICATED_PATCH | Freq: Every day | CUTANEOUS | Status: DC | PRN
  Administered 2023-04-21 – 2023-05-01 (×5): 1 via TRANSDERMAL
  Filled 2023-04-19 (×8): qty 1

## 2023-04-19 MED ORDER — IOHEXOL 300 MG/ML  SOLN
80.0000 mL | Freq: Once | INTRAMUSCULAR | Status: AC | PRN
  Administered 2023-04-19: 80 mL via INTRAVENOUS

## 2023-04-19 MED ORDER — SODIUM CHLORIDE 0.9 % IV SOLN
1.0000 g | INTRAVENOUS | Status: DC
  Administered 2023-04-20: 1 g via INTRAVENOUS
  Filled 2023-04-19 (×2): qty 10

## 2023-04-19 MED ORDER — MIRABEGRON ER 25 MG PO TB24
50.0000 mg | ORAL_TABLET | Freq: Every day | ORAL | Status: DC
  Administered 2023-04-19 – 2023-04-22 (×4): 50 mg via ORAL
  Filled 2023-04-19 (×5): qty 2

## 2023-04-19 MED ORDER — VIBEGRON 75 MG PO TABS: 50.0000 mg | ORAL_TABLET | Freq: Every day | ORAL | Status: DC

## 2023-04-19 MED ORDER — TRAZODONE HCL 50 MG PO TABS
25.0000 mg | ORAL_TABLET | Freq: Every evening | ORAL | Status: DC | PRN
  Administered 2023-04-20 – 2023-04-22 (×3): 25 mg via ORAL
  Filled 2023-04-19 (×3): qty 1

## 2023-04-19 MED ORDER — LACTATED RINGERS IV BOLUS
1000.0000 mL | Freq: Once | INTRAVENOUS | Status: AC
Start: 2023-04-19 — End: 2023-04-19
  Administered 2023-04-19: 1000 mL via INTRAVENOUS

## 2023-04-19 MED ORDER — VENLAFAXINE HCL ER 37.5 MG PO CP24
75.0000 mg | ORAL_CAPSULE | Freq: Every day | ORAL | Status: DC
  Administered 2023-04-20 – 2023-04-22 (×3): 75 mg via ORAL
  Filled 2023-04-19 (×4): qty 2

## 2023-04-19 MED ORDER — ONDANSETRON HCL 4 MG PO TABS: 4.0000 mg | ORAL_TABLET | Freq: Four times a day (QID) | ORAL | Status: DC | PRN

## 2023-04-19 MED ORDER — DILTIAZEM HCL ER COATED BEADS 120 MG PO CP24
120.0000 mg | ORAL_CAPSULE | Freq: Every day | ORAL | Status: DC
  Administered 2023-04-20 – 2023-04-21 (×2): 120 mg via ORAL
  Filled 2023-04-19 (×2): qty 1

## 2023-04-19 MED ORDER — ONDANSETRON HCL 4 MG/2ML IJ SOLN
4.0000 mg | Freq: Four times a day (QID) | INTRAMUSCULAR | Status: DC | PRN
  Administered 2023-04-22 – 2023-04-26 (×2): 4 mg via INTRAVENOUS
  Filled 2023-04-19 (×2): qty 2

## 2023-04-19 MED ORDER — HYDRALAZINE HCL 20 MG/ML IJ SOLN: 5.0000 mg | INTRAMUSCULAR | Status: DC | PRN

## 2023-04-19 MED ORDER — HYDROMORPHONE HCL 1 MG/ML IJ SOLN
0.5000 mg | INTRAMUSCULAR | Status: DC | PRN
  Administered 2023-04-20 – 2023-04-21 (×5): 1 mg via INTRAVENOUS
  Filled 2023-04-19 (×5): qty 1

## 2023-04-19 MED ORDER — ROPINIROLE HCL 1 MG PO TABS
0.5000 mg | ORAL_TABLET | Freq: Every evening | ORAL | Status: DC | PRN
  Administered 2023-04-26 – 2023-04-29 (×4): 0.5 mg via ORAL
  Filled 2023-04-19 (×4): qty 1

## 2023-04-19 NOTE — H&P (Signed)
 History and Physical    Patient: Luke Foster HQI:[id number] DOB: 1951/01/21 DOA: 04/19/2023 DOS: the patient was seen and examined on 04/19/2023 PCP: Elfredia Nevins, MD  Patient coming from: Home  Chief Complaint:  Chief Complaint  Patient presents with   Back Pain   HPI: Luke Foster is a 73 y.o. male with medical history significant of Chronic back pain s/p back surgery w/ nerve damage and bilat Le weakness, chronic foley for Urinary retention, nephrolithiasis, Hypertension, depression, Dementia  Level 5 caveat as pt unable to provide history. History provided by Torrie Mayers. Pt is being admitted for AKI from obstructive stone and infection. Per report pt fell approximately 6 days ago. At time of fall pt did not hit his head but developed MSK complaints and worsenign confusion. In particular pt has been complaining of progressive back pain since that time. Pt is on Oxycodone 15mg  at baseline. No new MSK deficits since fall. Due to worsening pain and confusion pt was brought ot [facility] for evaluation.    Review of Systems: As mentioned in the history of present illness. All other systems reviewed and are negative. Past Medical History:  Diagnosis Date   Arthritis    Chronic back pain    Chronic pain 1999   Bilateral feet (R >L)   Dementia (HCC) 04/19/2023   Difficult or painful urination    High blood pressure    History of kidney stones    H/O   Nerve damage    Pain management    Paresthesia of foot, bilateral    Renal disorder    Sleep apnea    USES CPAP   Weakness of both legs    Past Surgical History:  Procedure Laterality Date   arm surgery     BACK SURGERY     X2   BOTOX INJECTION N/A 10/06/2021   Procedure: CYSTOSCOPY LITHOLAPAXY WITH BOTOX INJECTION;  Surgeon: Crista Elliot, MD;  Location: [facility];  Service: Urology;  Laterality: N/A;   BOTOX INJECTION N/A 04/20/2022   Procedure: CYSTOSCOPY & BOTOX INJECTION, BLADDER FULGARATION;  Surgeon: Crista Elliot, MD;  Location: [facility];  Service: Urology;  Laterality: N/A;  45 MINS   BOTOX INJECTION N/A 12/28/2022   Procedure: CYSTOSCOPY WITH INTRA DETRUSOR BOTOX INJECTION;  Surgeon: Crista Elliot, MD;  Location: [facility];  Service: Urology;  Laterality: N/A;  45 MINS FOR CASE   BOWEL RESECTION N/A 01/15/2022   Procedure: SMALL BOWEL RESECTION;  Surgeon: Lewie Chamber, DO;  Location: [facility];  Service: General;  Laterality: N/A;   CYSTOSCOPY W/ URETERAL STENT PLACEMENT Right 11/29/2021   Procedure: CYSTOSCOPY WITH RETROGRADE PYELOGRAM/URETERAL STENT PLACEMENT;  Surgeon: Bjorn Pippin, MD;  Location: [facility];  Service: Urology;  Laterality: Right;   CYSTOSCOPY/URETEROSCOPY/HOLMIUM LASER/STENT PLACEMENT Right 01/05/2022   Procedure: CYSTOSCOPY RIGHT URETEROSCOPY/HOLMIUM LASER/STENT PLACEMENT basket extraction stones;  Surgeon: Crista Elliot, MD;  Location: [facility];  Service: Urology;  Laterality: Right;  1 HR FOR CASE   FACIAL COSMETIC SURGERY     HERNIA REPAIR     IR US GUIDE BX ASP/DRAIN  10/19/2017   KIDNEY STONE SURGERY     LAPAROTOMY N/A 01/15/2022   Procedure: Exploratory laparotomy;  Surgeon: Lewie Chamber, DO;  Location: [facility];  Service: General;  Laterality: N/A;   LUMBAR SPINAL CORD SIMULATOR LEAD REMOVAL N/A 03/01/2019   Procedure: LUMBAR SPINAL CORD SIMULATOR LEAD REMOVAL;  Surgeon: Venetia Night, MD;  Location: [facility];  Service: Neurosurgery;  Laterality: N/A;   MASTECTOMY Left    SPINAL CORD STIMULATOR REMOVAL N/A 03/01/2019   Procedure: LUMBAR SPINAL CORD STIMULATOR REMOVAL;  Surgeon: Venetia Night, MD;  Location: [facility];  Service: Neurosurgery;  Laterality: N/A;   WISDOM TOOTH EXTRACTION     Social History:  reports that he quit smoking about 31 years ago. His smoking use included cigarettes. He started smoking about 41 years ago. He has a 20 pack-year smoking history. He quit smokeless tobacco use about 31 years ago. He reports current alcohol use.  He reports that he does not use drugs.  Allergies  Allergen Reactions   Erythromycin Itching    Family History  Problem Relation Age of Onset   Heart disease Father        Living, 67   Breast cancer Mother        Living, 49   Hypercholesterolemia Mother    Healthy Brother    Healthy Sister     Prior to Admission medications   Medication Sig Start Date End Date Taking? Authorizing Provider  acetaminophen (TYLENOL) 500 MG tablet Take 1,000 mg by mouth every 6 (six) hours as needed for mild pain or moderate pain.   Yes [provider]  ascorbic acid (VITAMIN C) 500 MG tablet Take 500 mg by mouth daily.   Yes [provider]  Cholecalciferol (VITAMIN D) 50 MCG (2000 UT) CAPS Take 2,000 Units by mouth daily.   Yes [provider]  cyanocobalamin (VITAMIN B12) 1000 MCG tablet Take 1,000 mcg by mouth in the morning and at bedtime.   Yes [provider]  DILT-XR 120 MG 24 hr capsule Take 120 mg by mouth daily. 12/07/22  Yes [provider]  docusate sodium (COLACE) 100 MG capsule Take 1 capsule (100 mg total) by mouth in the morning, at noon, and at bedtime. Patient taking differently: Take 300 mg by mouth at bedtime. 05/17/20  Yes Medina-Vargas, Monina C, NP  enalapril (VASOTEC) 5 MG tablet Take 5 mg by mouth 2 (two) times daily. 01/19/22  Yes [provider]  lidocaine (LIDODERM) 5 % Place 1 patch onto the skin daily as needed (pain). 11/19/22  Yes [provider]  oxyCODONE (ROXICODONE) 15 MG immediate release tablet Take 1 tablet (15 mg total) by mouth every 6 (six) hours as needed for pain. Patient taking differently: Take 15 mg by mouth every 4 (four) hours as needed for pain. 05/17/20  Yes Medina-Vargas, Monina C, NP  polyethylene glycol (MIRALAX / GLYCOLAX) 17 g packet Take 17 g by mouth 2 (two) times daily. 05/17/20  Yes Medina-Vargas, Monina C, NP  rOPINIRole (REQUIP) 0.5 MG tablet Take 0.5 mg by mouth at bedtime as needed  (restless legs). 09/22/21  Yes [provider]  solifenacin (VESICARE) 5 MG tablet Take 5 mg by mouth daily. 07/17/21  Yes [provider]  sulfamethoxazole-trimethoprim (BACTRIM) 400-80 MG tablet Take 1 tablet by mouth at bedtime. 12/18/21  Yes [provider]  tamsulosin (FLOMAX) 0.4 MG CAPS capsule Take 1 capsule (0.4 mg total) by mouth every evening. Patient taking differently: Take 0.4 mg by mouth at bedtime. 05/17/20  Yes Medina-Vargas, Monina C, NP  venlafaxine XR (EFFEXOR-XR) 75 MG 24 hr capsule Take 1 capsule (75 mg total) by mouth in the morning and at bedtime. 05/17/20  Yes Medina-Vargas, Monina C, NP  Vibegron (GEMTESA PO) Take 50 mg by mouth at bedtime.   Yes [provider]  mirabegron ER [facility])  50 MG TB24 tablet Take 1 tablet (50 mg total) by mouth daily. Patient not taking: Reported on 04/19/2023 05/17/20   Gillis Santa, NP    Physical Exam: Vitals:   04/19/23 2000 04/19/23 2045 04/19/23 2055 04/19/23 2215  BP: 114/76 122/81  131/75  Pulse: (!) 108 (!) 111 (!) 118 (!) 118  Resp: 20 20  (!) 22  Temp:   98.7 F (37.1 C)   TempSrc:      SpO2: 94% 94% 95% 97%  Weight:      Height:         General:  Appears calm and comfortable Eyes:  PERRL, EOMI, normal lids, iris ENT: Dry mm, grossly normal hearing Neck:  no LAD, masses or thyromegaly Cardiovascular:  RRR, no m/r/g. No LE edema.  Respiratory:  CTA bilaterally, no w/r/r. Normal respiratory effort. Abdomen: Distended. RUQ pain w/ palpation. Faint BS.  Skin:  no rash or induration seen on limited exam Musculoskeletal: Bilat LE weakness w/ flextion. UE w/ grossly normal tone. No bony abnormality GU: foley in place Psychiatric:  grossly normal mood and affect, speech fluent and appropriate Neurologic:  CN 2-12 grossly intact, moves all extremities in coordinated fashion, sensation intact  Data Reviewed: {CT ABDOMEN PELVIS W CONTRAST Result Date: 04/19/2023 CLINICAL DATA:  Fall  with low back pain green urine in bag EXAM: CT ABDOMEN AND PELVIS WITH CONTRAST TECHNIQUE: Multidetector CT imaging of the abdomen and pelvis was performed using the standard protocol following bolus administration of intravenous contrast. RADIATION DOSE REDUCTION: This exam was performed according to the departmental dose-optimization program which includes automated exposure control, adjustment of the mA and/or kV according to patient size and/or use of iterative reconstruction technique. CONTRAST:  80mL OMNIPAQUE IOHEXOL 300 MG/ML  SOLN COMPARISON:  CT 11/03/2022 FINDINGS: Lower chest: Lung bases demonstrate atelectasis and or scarring at the lung bases. No acute airspace disease Hepatobiliary: Distended gallbladder. No calcified stone or biliary dilatation Pancreas: Atrophic.  No inflammation Spleen: Normal in size without focal abnormality. Adrenals/Urinary Tract: Adrenal glands are normal. Bilateral renal cysts, on the right measures up to 14 cm, no specific imaging follow-up is recommended. Bilateral kidney stones, on the left measuring up to 3 mm and on the right measuring up to 4 mm. New moderate right hydronephrosis and hydroureter, secondary to a 9 mm stone in the distal right ureter at the level of L5-S1. There is an additional stone within the ureter distal to the obstructing stone, this measures 6 mm and is visual about 2 cm proximal to the left UVJ. There is perinephric fluid and stranding on the right in addition to mild urothelial enhancement. Delayed excretion of contrast from right kidney consistent with obstruction. Bladder is decompressed by Foley catheter. Diffuse bladder wall thickening with perivesical stranding. Small calcifications within the bladder probably due to stones Stomach/Bowel: Stomach nonenlarged. No dilated small bowel. No acute bowel wall thickening. Vascular/Lymphatic: Aortic atherosclerosis. No enlarged abdominal or pelvic lymph nodes. Reproductive: Enlarged prostate Other:  Negative for pelvic effusion or free air Musculoskeletal: See separately dictated lumbar spine CT report. No acute osseous findings could in the pelvis. IMPRESSION: 1. Moderate right hydronephrosis and hydroureter, secondary to a 9 mm stone in the distal right ureter at the level of L5-S1. There is an additional 6 mm stone within the ureter distal to the obstructing stone. There is perinephric fluid and stranding on the right in addition to mild urothelial enhancement raising possibility of superimposed infection. 2. Bilateral kidney stones. Bladder is  decompressed by Foley catheter. Diffuse bladder wall thickening with perivesical stranding, question cystitis. Small calcifications within the bladder probably due to stones. 3. Enlarged prostate. 4. Aortic atherosclerosis. Electronically Signed   By: Jasmine Pang M.D.   On: 04/19/2023 20:17   CT L-SPINE NO CHARGE Result Date: 04/19/2023 CLINICAL DATA:  Back pain, fall 2 days ago. EXAM: CT LUMBAR SPINE WITHOUT CONTRAST TECHNIQUE: Multidetector CT imaging of the lumbar spine was performed without intravenous contrast administration. Multiplanar CT image reconstructions were also generated. RADIATION DOSE REDUCTION: This exam was performed according to the departmental dose-optimization program which includes automated exposure control, adjustment of the mA and/or kV according to patient size and/or use of iterative reconstruction technique. COMPARISON:  MRI lumbar spine 03/22/2019, CT lumbar spine 10/24/2018 FINDINGS: Segmentation: 5 lumbar type vertebrae. Alignment: Lumbar lordosis is maintained.  No listhesis. Vertebrae: Diffuse osteopenia. Similar irregularity of the T12 superior endplate with moderate height loss centrally. Additional irregularities of the L1 superior and inferior endplates with mild height loss anteriorly is similar to prior. No additional compression deformities or evidence of displaced fracture on the current study. Posterior instrumented  fusion from L4-S1 with bilateral pedicle screws and vertical interlocking rods. Hardware is intact. Mature osseous fusion noted from L4-S1. Paraspinal and other soft tissues: The paraspinal musculature is unremarkable. Large partially visualized cystic lesion involving the right kidney better evaluated on same day CT abdomen pelvis. Additional partially visualized left renal cyst. Multiple calculi noted in the right kidney with prominence of the right renal collecting system. Atherosclerosis of the abdominal aorta and branch vessels. Degenerative changes of the bilateral sacroiliac joints. Disc levels: Intervertebral disc spaces are maintained. There is a disc bulge at L1-2 along the posterior osteophytes more pronounced along the left without significant osseous spinal canal stenosis. Additional disc bulge and facet arthrosis at L2-3 resulting in mild spinal canal stenosis. Disc bulge and facet arthrosis at L3-4 resulting in mild spinal canal stenosis. Disc bulge and facet osteophytes contributing to mild right and mild-to-moderate left foraminal stenosis at L3-4. No definite spinal canal narrowing at L4-5 or L5-S1. IMPRESSION: 1. No acute fracture or traumatic malalignment of the lumbar spine. 2. Similar chronic compression deformities of T12 and L1. No retropulsion. 3. Posterior instrumented fusion from L4-S1 without evidence of hardware complication. 4. Multilevel degenerative changes of the lumbar spine as described. Electronically Signed   By: Emily Filbert M.D.   On: 04/19/2023 20:11   CT Head Wo Contrast Result Date: 04/19/2023 CLINICAL DATA:  Altered mental status EXAM: CT HEAD WITHOUT CONTRAST TECHNIQUE: Contiguous axial images were obtained from the base of the skull through the vertex without intravenous contrast. RADIATION DOSE REDUCTION: This exam was performed according to the departmental dose-optimization program which includes automated exposure control, adjustment of the mA and/or kV according  to patient size and/or use of iterative reconstruction technique. COMPARISON:  04/29/2020 FINDINGS: Brain: There is no mass, hemorrhage or extra-axial collection. There is generalized atrophy without lobar predilection. Hypodensity of the white matter is most commonly associated with chronic microvascular disease. Vascular: No hyperdense vessel or unexpected vascular calcification. Skull: The visualized skull base, calvarium and extracranial soft tissues are normal. Sinuses/Orbits: Partial sphenoid sinus opacification. Normal orbits. Other: None. IMPRESSION: 1. No acute intracranial abnormality. 2. Generalized atrophy and findings of chronic microvascular disease. Electronically Signed   By: Deatra Robinson M.D.   On: 04/19/2023 20:03   DG Chest 2 View Result Date: 04/19/2023 CLINICAL DATA:  Sepsis.  Back pain. EXAM: CHEST - 2  VIEW COMPARISON:  11/03/2022 FINDINGS: Two views of the chest demonstrate low lung volumes. Linear densities in left lower lung are suggestive for atelectasis. Linear densities in the right lung may also represent atelectasis. Heart size is normal. No large pleural effusions. Negative for a pneumothorax. IMPRESSION: Low lung volumes with bilateral atelectasis. Electronically Signed   By: Richarda Overlie M.D.   On: 04/19/2023 18:05    EKG: RBBB, Sinus, Tachy,   Assessment and Plan: 73yo M w/ AKI from hydronephrosis from obstructive stones w/ potential complicating infection.   Hydronephrosis/Pyelonephritis/urolithiasis: CT showing R ureter w/ 1cm and 6mm stones and massive hydronephrosis. Some stranding noted on CT w/ R CVA tendenress on exam w/ concerning UA and elevated WBC. AFVSS.  - Urology consulted and managing. Appreciate their assistance - Pt to be [facility]  - NPO after midnight for surgical intervention in am.  - Continue Rocephin  Chronic Urinary Retention / BPH: Foley in place. Likely chronically colonized.  - Continue Flomax, Vibegron   AKI: Cr 1.88 on admission.  Likely secondary to obstructive disease - Mgt as above - Trend Cr w/ am labs.   Anxiety/RLS:  - continue home Effexor, Requip  HTN: - continue home DiltXR and Enalapril  Chronic MSK/Neuropathic pain:  - continue lidoderm patch, - Hold PO Oxycodone and use IV Dilaudid.      Advance Care Planning:   Code Status: Prior FULL  Consults: Urology  Family Communication: Brother [name]  Severity of Illness: The appropriate patient status for this patient is INPATIENT. Inpatient status is judged to be reasonable and necessary in order to provide the required intensity of service to ensure the patient's safety. The patient's presenting symptoms, physical exam findings, and initial radiographic and laboratory data in the context of their chronic comorbidities is felt to place them at high risk for further clinical deterioration. Furthermore, it is not anticipated that the patient will be medically stable for discharge from the hospital within 2 midnights of admission.   * I certify that at the point of admission it is my clinical judgment that the patient will require inpatient hospital care spanning beyond 2 midnights from the point of admission due to high intensity of service, high risk for further deterioration and high frequency of surveillance required.*  Author: Ozella Rocks, MD 04/19/2023 10:30 PM  For on call review [email or web address].

## 2023-04-19 NOTE — ED Triage Notes (Signed)
 Pt BIB [facility] from home for c/o back pain; pt state she fell x 2 days ago and is c/o lower back pain now  Pt has a foley that has light green urine in bag  Pt took 2 tylenol at 2pm today

## 2023-04-19 NOTE — Consult Note (Signed)
 Urology Consult   Reason for consult: right ureteral stone, AKI, pain  History of Present Illness: Luke Foster is a 73 y.o. M with right uretral stones. Patient presented to ED c/o severe R back pain.  CT scan was done showing multiple obstructing right ureteral calculi  Known to GU for hx of urinary retention managed with foley, urgency incontinence managed with botox injections with Dr Alvester Morin.   Labs in ED show increase in Cr (baseline ~1.3 ->1.88), mild leukocytosis (10.9). Urine positive for LE, blood. Afebrile.   CT shows 2 stones in R ureter - 1 cm just inside the pelvic inlet, 6 mm a few cm proximal to the UVJ. There are a few fragments in the right kidney, largest ~7 mm. Small left sided stone. Prostate is enlarged  On my exam today, patient still endorses severe R back/flank pain and nausea.   Past Medical History:  Diagnosis Date   Arthritis    Chronic back pain    Chronic pain 1999   Bilateral feet (R >L)   Difficult or painful urination    High blood pressure    History of kidney stones    H/O   Nerve damage    Pain management    Paresthesia of foot, bilateral    Renal disorder    Sleep apnea    USES CPAP   Weakness of both legs     Past Surgical History:  Procedure Laterality Date   arm surgery     BACK SURGERY     X2   BOTOX INJECTION N/A 10/06/2021   Procedure: CYSTOSCOPY LITHOLAPAXY WITH BOTOX INJECTION;  Surgeon: Crista Elliot, MD;  Location: [facility];  Service: Urology;  Laterality: N/A;   BOTOX INJECTION N/A 04/20/2022   Procedure: CYSTOSCOPY & BOTOX INJECTION, BLADDER FULGARATION;  Surgeon: Crista Elliot, MD;  Location: [facility];  Service: Urology;  Laterality: N/A;  45 MINS   BOTOX INJECTION N/A 12/28/2022   Procedure: CYSTOSCOPY WITH INTRA DETRUSOR BOTOX INJECTION;  Surgeon: Crista Elliot, MD;  Location: [facility];  Service: Urology;  Laterality: N/A;  45 MINS FOR CASE   BOWEL RESECTION N/A 01/15/2022   Procedure: SMALL BOWEL RESECTION;   Surgeon: Lewie Chamber, DO;  Location: [facility];  Service: General;  Laterality: N/A;   CYSTOSCOPY W/ URETERAL STENT PLACEMENT Right 11/29/2021   Procedure: CYSTOSCOPY WITH RETROGRADE PYELOGRAM/URETERAL STENT PLACEMENT;  Surgeon: Bjorn Pippin, MD;  Location: [facility];  Service: Urology;  Laterality: Right;   CYSTOSCOPY/URETEROSCOPY/HOLMIUM LASER/STENT PLACEMENT Right 01/05/2022   Procedure: CYSTOSCOPY RIGHT URETEROSCOPY/HOLMIUM LASER/STENT PLACEMENT basket extraction stones;  Surgeon: Crista Elliot, MD;  Location: [facility];  Service: Urology;  Laterality: Right;  1 HR FOR CASE   FACIAL COSMETIC SURGERY     HERNIA REPAIR     IR US GUIDE BX ASP/DRAIN  10/19/2017   KIDNEY STONE SURGERY     LAPAROTOMY N/A 01/15/2022   Procedure: Exploratory laparotomy;  Surgeon: Lewie Chamber, DO;  Location: [facility];  Service: General;  Laterality: N/A;   LUMBAR SPINAL CORD SIMULATOR LEAD REMOVAL N/A 03/01/2019   Procedure: LUMBAR SPINAL CORD SIMULATOR LEAD REMOVAL;  Surgeon: Venetia Night, MD;  Location: [facility];  Service: Neurosurgery;  Laterality: N/A;   MASTECTOMY Left    SPINAL CORD STIMULATOR REMOVAL N/A 03/01/2019   Procedure: LUMBAR SPINAL CORD STIMULATOR REMOVAL;  Surgeon: Venetia Night, MD;  Location: [facility];  Service: Neurosurgery;  Laterality: N/A;   WISDOM TOOTH EXTRACTION  Current Hospital Medications:  Home Meds:  No current facility-administered medications on file prior to encounter.   Current Outpatient Medications on File Prior to Encounter  Medication Sig Dispense Refill   acetaminophen (TYLENOL) 500 MG tablet Take 1,000 mg by mouth every 6 (six) hours as needed for mild pain or moderate pain.     ascorbic acid (VITAMIN C) 500 MG tablet Take 500 mg by mouth daily.     Cholecalciferol (VITAMIN D) 50 MCG (2000 UT) CAPS Take 2,000 Units by mouth daily.     cyanocobalamin (VITAMIN B12) 1000 MCG tablet Take 1,000 mcg by mouth in the morning and at bedtime.      DILT-XR 120 MG 24 hr capsule Take 120 mg by mouth daily.     docusate sodium (COLACE) 100 MG capsule Take 1 capsule (100 mg total) by mouth in the morning, at noon, and at bedtime. (Patient taking differently: Take 300 mg by mouth at bedtime.) 90 capsule 0   enalapril (VASOTEC) 5 MG tablet Take 5 mg by mouth 2 (two) times daily.     lidocaine (LIDODERM) 5 % Place 1 patch onto the skin daily as needed (pain).     oxyCODONE (ROXICODONE) 15 MG immediate release tablet Take 1 tablet (15 mg total) by mouth every 6 (six) hours as needed for pain. (Patient taking differently: Take 15 mg by mouth every 4 (four) hours as needed for pain.) 30 tablet 0   polyethylene glycol (MIRALAX / GLYCOLAX) 17 g packet Take 17 g by mouth 2 (two) times daily. 14 each 3   rOPINIRole (REQUIP) 0.5 MG tablet Take 0.5 mg by mouth at bedtime as needed (restless legs).     solifenacin (VESICARE) 5 MG tablet Take 5 mg by mouth daily.     sulfamethoxazole-trimethoprim (BACTRIM) 400-80 MG tablet Take 1 tablet by mouth at bedtime.     tamsulosin (FLOMAX) 0.4 MG CAPS capsule Take 1 capsule (0.4 mg total) by mouth every evening. (Patient taking differently: Take 0.4 mg by mouth at bedtime.) 30 capsule 0   venlafaxine XR (EFFEXOR-XR) 75 MG 24 hr capsule Take 1 capsule (75 mg total) by mouth in the morning and at bedtime. 60 capsule 0   Vibegron (GEMTESA PO) Take 50 mg by mouth at bedtime.     mirabegron ER (MYRBETRIQ) 50 MG TB24 tablet Take 1 tablet (50 mg total) by mouth daily. (Patient not taking: Reported on 04/19/2023) 30 tablet 0     Scheduled Meds: Continuous Infusions: PRN Meds:.  Allergies:  Allergies  Allergen Reactions   Erythromycin Itching    Family History  Problem Relation Age of Onset   Heart disease Father        Living, 60   Breast cancer Mother        Living, 25   Hypercholesterolemia Mother    Healthy Brother    Healthy Sister     Social History:  reports that he quit smoking about 31 years ago. His  smoking use included cigarettes. He started smoking about 41 years ago. He has a 20 pack-year smoking history. He quit smokeless tobacco use about 31 years ago. He reports current alcohol use. He reports that he does not use drugs.  ROS: A complete review of systems was performed.  All systems are negative except for pertinent findings as noted.  Physical Exam:  Vital signs in last 24 hours: Temp:  [98.7 F (37.1 C)] 98.7 F (37.1 C) (03/10 2055) Pulse Rate:  [102-118] 118 (03/10 2055)  Resp:  [11-24] 20 (03/10 2045) BP: (114-138)/(71-102) 122/81 (03/10 2045) SpO2:  [93 %-98 %] 95 % (03/10 2055) Weight:  [81.6 kg] 81.6 kg (03/10 1457) Constitutional:  Alert and oriented, No acute distress Cardiovascular: Regular rate and rhythm Respiratory: Normal respiratory effort, Lungs clear bilaterally GI: Abdomen is soft, nontender, nondistended, no abdominal masses Neurologic: Grossly intact, no focal deficits Psychiatric: Normal mood and affect  Laboratory Data:  Recent Labs    04/19/23 1501  WBC 10.9*  HGB 11.5*  HCT 35.9*  PLT 288    Recent Labs    04/19/23 1501  NA 135  K 4.1  CL 105  GLUCOSE 127*  BUN 39*  CALCIUM 10.0  CREATININE 1.88*     Results for orders placed or performed during the hospital encounter of 04/19/23 (from the past 24 hours)  Lactic acid, plasma     Status: None   Collection Time: 04/19/23  3:01 PM  Result Value Ref Range   Lactic Acid, Venous 1.9 0.5 - 1.9 mmol/L  Lactic acid, plasma     Status: None   Collection Time: 04/19/23  3:01 PM  Result Value Ref Range   Lactic Acid, Venous 1.7 0.5 - 1.9 mmol/L  Comprehensive metabolic panel     Status: Abnormal   Collection Time: 04/19/23  3:01 PM  Result Value Ref Range   Sodium 135 135 - 145 mmol/L   Potassium 4.1 3.5 - 5.1 mmol/L   Chloride 105 98 - 111 mmol/L   CO2 16 (L) 22 - 32 mmol/L   Glucose, Bld 127 (H) 70 - 99 mg/dL   BUN 39 (H) 8 - 23 mg/dL   Creatinine, Ser 1.61 (H) 0.61 - 1.24 mg/dL    Calcium 09.6 8.9 - 04.5 mg/dL   Total Protein 8.1 6.5 - 8.1 g/dL   Albumin 3.2 (L) 3.5 - 5.0 g/dL   AST 21 15 - 41 U/L   ALT 22 0 - 44 U/L   Alkaline Phosphatase 85 38 - 126 U/L   Total Bilirubin 0.6 0.0 - 1.2 mg/dL   GFR, Estimated 38 (L) >60 mL/min   Anion gap 14 5 - 15  CBC with Differential     Status: Abnormal   Collection Time: 04/19/23  3:01 PM  Result Value Ref Range   WBC 10.9 (H) 4.0 - 10.5 K/uL   RBC 3.69 (L) 4.22 - 5.81 MIL/uL   Hemoglobin 11.5 (L) 13.0 - 17.0 g/dL   HCT 40.9 (L) 81.1 - 91.4 %   MCV 97.3 80.0 - 100.0 fL   MCH 31.2 26.0 - 34.0 pg   MCHC 32.0 30.0 - 36.0 g/dL   RDW 78.2 95.6 - 21.3 %   Platelets 288 150 - 400 K/uL   nRBC 0.0 0.0 - 0.2 %   Neutrophils Relative % 90 %   Neutro Abs 9.7 (H) 1.7 - 7.7 K/uL   Lymphocytes Relative 4 %   Lymphs Abs 0.5 (L) 0.7 - 4.0 K/uL   Monocytes Relative 5 %   Monocytes Absolute 0.5 0.1 - 1.0 K/uL   Eosinophils Relative 0 %   Eosinophils Absolute 0.0 0.0 - 0.5 K/uL   Basophils Relative 0 %   Basophils Absolute 0.0 0.0 - 0.1 K/uL   Immature Granulocytes 1 %   Abs Immature Granulocytes 0.09 (H) 0.00 - 0.07 K/uL  Troponin I (High Sensitivity)     Status: None   Collection Time: 04/19/23  3:01 PM  Result Value Ref Range   Troponin  I (High Sensitivity) 7 <18 ng/L  Blood gas, venous     Status: Abnormal   Collection Time: 04/19/23  4:05 PM  Result Value Ref Range   pH, Ven 7.34 7.25 - 7.43   pCO2, Ven 37 (L) 44 - 60 mmHg   pO2, Ven <31 (LL) 32 - 45 mmHg   Bicarbonate 20.0 20.0 - 28.0 mmol/L   Acid-base deficit 5.2 (H) 0.0 - 2.0 mmol/L   O2 Saturation 44.1 %   Patient temperature 37.1    Collection site BLOOD RIGHT FOREARM    Drawn by 1610   Beta-hydroxybutyric acid     Status: Abnormal   Collection Time: 04/19/23  4:05 PM  Result Value Ref Range   Beta-Hydroxybutyric Acid 0.35 (H) 0.05 - 0.27 mmol/L  Urinalysis, w/ Reflex to Culture (Infection Suspected) -Urine, Catheterized; Indwelling urinary catheter      Status: Abnormal   Collection Time: 04/19/23  4:23 PM  Result Value Ref Range   Specimen Source URINE, CATHETERIZED    Color, Urine AMBER (A) YELLOW   APPearance CLOUDY (A) CLEAR   Specific Gravity, Urine 1.026 1.005 - 1.030   pH 5.0 5.0 - 8.0   Glucose, UA NEGATIVE NEGATIVE mg/dL   Hgb urine dipstick LARGE (A) NEGATIVE   Bilirubin Urine NEGATIVE NEGATIVE   Ketones, ur NEGATIVE NEGATIVE mg/dL   Protein, ur 960 (A) NEGATIVE mg/dL   Nitrite NEGATIVE NEGATIVE   Leukocytes,Ua MODERATE (A) NEGATIVE   RBC / HPF >50 0 - 5 RBC/hpf   WBC, UA >50 0 - 5 WBC/hpf   Bacteria, UA RARE (A) NONE SEEN   Squamous Epithelial / HPF 0-5 0 - 5 /HPF   WBC Clumps PRESENT    Mucus PRESENT    No results found for this or any previous visit (from the past 240 hours).  Renal Function: Recent Labs    04/19/23 1501  CREATININE 1.88*   Estimated Creatinine Clearance: 34.9 mL/min (A) (by C-G formula based on SCr of 1.88 mg/dL (H)).  Radiologic Imaging: CT ABDOMEN PELVIS W CONTRAST Result Date: 04/19/2023 CLINICAL DATA:  Fall with low back pain green urine in bag EXAM: CT ABDOMEN AND PELVIS WITH CONTRAST TECHNIQUE: Multidetector CT imaging of the abdomen and pelvis was performed using the standard protocol following bolus administration of intravenous contrast. RADIATION DOSE REDUCTION: This exam was performed according to the departmental dose-optimization program which includes automated exposure control, adjustment of the mA and/or kV according to patient size and/or use of iterative reconstruction technique. CONTRAST:  80mL OMNIPAQUE IOHEXOL 300 MG/ML  SOLN COMPARISON:  CT 11/03/2022 FINDINGS: Lower chest: Lung bases demonstrate atelectasis and or scarring at the lung bases. No acute airspace disease Hepatobiliary: Distended gallbladder. No calcified stone or biliary dilatation Pancreas: Atrophic.  No inflammation Spleen: Normal in size without focal abnormality. Adrenals/Urinary Tract: Adrenal glands are  normal. Bilateral renal cysts, on the right measures up to 14 cm, no specific imaging follow-up is recommended. Bilateral kidney stones, on the left measuring up to 3 mm and on the right measuring up to 4 mm. New moderate right hydronephrosis and hydroureter, secondary to a 9 mm stone in the distal right ureter at the level of L5-S1. There is an additional stone within the ureter distal to the obstructing stone, this measures 6 mm and is visual about 2 cm proximal to the left UVJ. There is perinephric fluid and stranding on the right in addition to mild urothelial enhancement. Delayed excretion of contrast from right kidney consistent with  obstruction. Bladder is decompressed by Foley catheter. Diffuse bladder wall thickening with perivesical stranding. Small calcifications within the bladder probably due to stones Stomach/Bowel: Stomach nonenlarged. No dilated small bowel. No acute bowel wall thickening. Vascular/Lymphatic: Aortic atherosclerosis. No enlarged abdominal or pelvic lymph nodes. Reproductive: Enlarged prostate Other: Negative for pelvic effusion or free air Musculoskeletal: See separately dictated lumbar spine CT report. No acute osseous findings could in the pelvis. IMPRESSION: 1. Moderate right hydronephrosis and hydroureter, secondary to a 9 mm stone in the distal right ureter at the level of L5-S1. There is an additional 6 mm stone within the ureter distal to the obstructing stone. There is perinephric fluid and stranding on the right in addition to mild urothelial enhancement raising possibility of superimposed infection. 2. Bilateral kidney stones. Bladder is decompressed by Foley catheter. Diffuse bladder wall thickening with perivesical stranding, question cystitis. Small calcifications within the bladder probably due to stones. 3. Enlarged prostate. 4. Aortic atherosclerosis. Electronically Signed   By: Jasmine Pang M.D.   On: 04/19/2023 20:17   CT L-SPINE NO CHARGE Result Date:  04/19/2023 CLINICAL DATA:  Back pain, fall 2 days ago. EXAM: CT LUMBAR SPINE WITHOUT CONTRAST TECHNIQUE: Multidetector CT imaging of the lumbar spine was performed without intravenous contrast administration. Multiplanar CT image reconstructions were also generated. RADIATION DOSE REDUCTION: This exam was performed according to the departmental dose-optimization program which includes automated exposure control, adjustment of the mA and/or kV according to patient size and/or use of iterative reconstruction technique. COMPARISON:  MRI lumbar spine 03/22/2019, CT lumbar spine 10/24/2018 FINDINGS: Segmentation: 5 lumbar type vertebrae. Alignment: Lumbar lordosis is maintained.  No listhesis. Vertebrae: Diffuse osteopenia. Similar irregularity of the T12 superior endplate with moderate height loss centrally. Additional irregularities of the L1 superior and inferior endplates with mild height loss anteriorly is similar to prior. No additional compression deformities or evidence of displaced fracture on the current study. Posterior instrumented fusion from L4-S1 with bilateral pedicle screws and vertical interlocking rods. Hardware is intact. Mature osseous fusion noted from L4-S1. Paraspinal and other soft tissues: The paraspinal musculature is unremarkable. Large partially visualized cystic lesion involving the right kidney better evaluated on same day CT abdomen pelvis. Additional partially visualized left renal cyst. Multiple calculi noted in the right kidney with prominence of the right renal collecting system. Atherosclerosis of the abdominal aorta and branch vessels. Degenerative changes of the bilateral sacroiliac joints. Disc levels: Intervertebral disc spaces are maintained. There is a disc bulge at L1-2 along the posterior osteophytes more pronounced along the left without significant osseous spinal canal stenosis. Additional disc bulge and facet arthrosis at L2-3 resulting in mild spinal canal stenosis. Disc  bulge and facet arthrosis at L3-4 resulting in mild spinal canal stenosis. Disc bulge and facet osteophytes contributing to mild right and mild-to-moderate left foraminal stenosis at L3-4. No definite spinal canal narrowing at L4-5 or L5-S1. IMPRESSION: 1. No acute fracture or traumatic malalignment of the lumbar spine. 2. Similar chronic compression deformities of T12 and L1. No retropulsion. 3. Posterior instrumented fusion from L4-S1 without evidence of hardware complication. 4. Multilevel degenerative changes of the lumbar spine as described. Electronically Signed   By: Emily Filbert M.D.   On: 04/19/2023 20:11   CT Head Wo Contrast Result Date: 04/19/2023 CLINICAL DATA:  Altered mental status EXAM: CT HEAD WITHOUT CONTRAST TECHNIQUE: Contiguous axial images were obtained from the base of the skull through the vertex without intravenous contrast. RADIATION DOSE REDUCTION: This exam was performed according to  the departmental dose-optimization program which includes automated exposure control, adjustment of the mA and/or kV according to patient size and/or use of iterative reconstruction technique. COMPARISON:  04/29/2020 FINDINGS: Brain: There is no mass, hemorrhage or extra-axial collection. There is generalized atrophy without lobar predilection. Hypodensity of the white matter is most commonly associated with chronic microvascular disease. Vascular: No hyperdense vessel or unexpected vascular calcification. Skull: The visualized skull base, calvarium and extracranial soft tissues are normal. Sinuses/Orbits: Partial sphenoid sinus opacification. Normal orbits. Other: None. IMPRESSION: 1. No acute intracranial abnormality. 2. Generalized atrophy and findings of chronic microvascular disease. Electronically Signed   By: Deatra Robinson M.D.   On: 04/19/2023 20:03   DG Chest 2 View Result Date: 04/19/2023 CLINICAL DATA:  Sepsis.  Back pain. EXAM: CHEST - 2 VIEW COMPARISON:  11/03/2022 FINDINGS: Two views of  the chest demonstrate low lung volumes. Linear densities in left lower lung are suggestive for atelectasis. Linear densities in the right lung may also represent atelectasis. Heart size is normal. No large pleural effusions. Negative for a pneumothorax. IMPRESSION: Low lung volumes with bilateral atelectasis. Electronically Signed   By: Richarda Overlie M.D.   On: 04/19/2023 18:05    I independently reviewed the above imaging studies.  Impression/Recommendation 73 yo M with multiple right ureteral calculi, R hydronephrosis, AKI, refractory pain  Discussed options and recommended to right ureteral stent in OR. Procedure and risks reviewed with patient, including hematuria, retention, UTI, and the need for a staged ureteroscopy.   Patient and brother voiced understanding and all questions answered  Irine Seal MD 04/19/2023, 9:54 PM  [facility]  Pager: [phone]

## 2023-04-19 NOTE — ED Provider Notes (Signed)
  EMERGENCY DEPARTMENT AT [facility] Provider Note   CSN: [id number] Arrival date & time: 04/19/23  1431     History {Add pertinent medical, surgical, social history, OB history to HPI:1} Chief Complaint  Patient presents with   Back Pain    Luke Foster is a 73 y.o. male.  HPI 73 year old male presents with back pain.  History is primarily from the brother at the bedside.  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  The patient has had progressive confusion/memory difficulties for years but more rapid over the last 6 months or so.  He fell about 6 days ago but it did not seem like a significant injury, he mostly scraped his elbow.  Did not hit his head.  However he has been complaining of his back hurting since and the back pain is continued despite his home 15 mg oxycodone.  He has no new weakness in his extremities though is generally weak and getting up less at home.  He has caregivers as well as the brother who helps take care of him.  No fevers, cough, vomiting.  He has had some abdominal distention for the last several weeks though has been having some bowel movements.  However the distention reminds the brother of when the patient had a bowel obstruction.  Home Medications Prior to Admission medications   Medication Sig Start Date End Date Taking? Authorizing Provider  acetaminophen (TYLENOL) 500 MG tablet Take 1,000 mg by mouth every 6 (six) hours as needed for mild pain or moderate pain.   Yes [provider]  ascorbic acid (VITAMIN C) 500 MG tablet Take 500 mg by mouth daily.   Yes [provider]  Cholecalciferol (VITAMIN D) 50 MCG (2000 UT) CAPS Take 2,000 Units by mouth daily.   Yes [provider]  cyanocobalamin (VITAMIN B12) 1000 MCG tablet Take 1,000 mcg by mouth in the morning and at bedtime.   Yes [provider]  DILT-XR 120 MG 24 hr capsule  Take 120 mg by mouth daily. 12/07/22  Yes [provider]  docusate sodium (COLACE) 100 MG capsule Take 1 capsule (100 mg total) by mouth in the morning, at noon, and at bedtime. Patient taking differently: Take 300 mg by mouth at bedtime. 05/17/20  Yes Medina-Vargas, Monina C, NP  enalapril (VASOTEC) 5 MG tablet Take 5 mg by mouth 2 (two) times daily. 01/19/22  Yes [provider]  lidocaine (LIDODERM) 5 % Place 1 patch onto the skin daily as needed (pain). 11/19/22  Yes [provider]  oxyCODONE (ROXICODONE) 15 MG immediate release tablet Take 1 tablet (15 mg total) by mouth every 6 (six) hours as needed for pain. Patient taking differently: Take 15 mg by mouth every 4 (four) hours as needed for pain. 05/17/20  Yes Medina-Vargas, Monina C, NP  polyethylene glycol (MIRALAX / GLYCOLAX) 17 g packet Take 17 g by mouth 2 (two) times daily. 05/17/20  Yes Medina-Vargas, Monina C, NP  rOPINIRole (REQUIP) 0.5 MG tablet Take 0.5 mg by mouth at bedtime as needed (restless legs). 09/22/21  Yes [provider]  solifenacin (VESICARE) 5 MG tablet Take 5 mg by mouth daily. 07/17/21  Yes [provider]  sulfamethoxazole-trimethoprim (BACTRIM) 400-80 MG tablet Take 1 tablet by mouth at bedtime. 12/18/21  Yes [provider]  tamsulosin (FLOMAX) 0.4 MG CAPS capsule Take 1 capsule (0.4 mg total)  by mouth every evening. Patient taking differently: Take 0.4 mg by mouth at bedtime. 05/17/20  Yes Medina-Vargas, Monina C, NP  venlafaxine XR (EFFEXOR-XR) 75 MG 24 hr capsule Take 1 capsule (75 mg total) by mouth in the morning and at bedtime. 05/17/20  Yes Medina-Vargas, Monina C, NP  Vibegron (GEMTESA PO) Take 50 mg by mouth at bedtime.   Yes [provider]  mirabegron ER (MYRBETRIQ) 50 MG TB24 tablet Take 1 tablet (50 mg total) by mouth daily. Patient not taking: Reported on 04/19/2023 05/17/20   Medina-Vargas, Monina C, NP      Allergies    Erythromycin    Review of  Systems   Review of Systems  Unable to perform ROS: Dementia    Physical Exam Updated Vital Signs BP (!) 138/102 (BP Location: Right Arm)   Pulse (!) 113   Temp 98.7 F (37.1 C) (Oral)   Resp 16   Ht 5\' 8"  (1.727 m)   Wt 81.6 kg   SpO2 98%   BMI 27.37 kg/m  Physical Exam Vitals and nursing note reviewed.  Constitutional:      Appearance: He is well-developed.  HENT:     Head: Normocephalic and atraumatic.  Cardiovascular:     Rate and Rhythm: Regular rhythm. Tachycardia present.     Heart sounds: Normal heart sounds.  Pulmonary:     Effort: Pulmonary effort is normal.     Breath sounds: Normal breath sounds.  Abdominal:     General: There is distension.     Palpations: Abdomen is soft.     Tenderness: There is abdominal tenderness.  Musculoskeletal:     Thoracic back: No tenderness.     Lumbar back: Tenderness present.       Back:  Skin:    General: Skin is warm and dry.  Neurological:     Mental Status: He is alert.     Comments: Chronic bilateral leg weakness, can keep neither leg off the stretcher when passively raised. This is baseline per brother.     ED Results / Procedures / Treatments   Labs (all labs ordered are listed, but only abnormal results are displayed) Labs Reviewed  COMPREHENSIVE METABOLIC PANEL - Abnormal; Notable for the following components:      Result Value   CO2 16 (*)    Glucose, Bld 127 (*)    BUN 39 (*)    Creatinine, Ser 1.88 (*)    Albumin 3.2 (*)    GFR, Estimated 38 (*)    All other components within normal limits  CBC WITH DIFFERENTIAL/PLATELET - Abnormal; Notable for the following components:   WBC 10.9 (*)    RBC 3.69 (*)    Hemoglobin 11.5 (*)    HCT 35.9 (*)    Neutro Abs 9.7 (*)    Lymphs Abs 0.5 (*)    Abs Immature Granulocytes 0.09 (*)    All other components within normal limits  LACTIC ACID, PLASMA  LACTIC ACID, PLASMA  URINALYSIS, W/ REFLEX TO CULTURE (INFECTION SUSPECTED)  TROPONIN I (HIGH SENSITIVITY)     EKG None  Radiology No results found.  Procedures Procedures  {Document cardiac monitor, telemetry assessment procedure when appropriate:1}  Medications Ordered in ED Medications  lactated ringers bolus 1,000 mL (has no administration in time range)  HYDROmorphone (DILAUDID) injection 0.5 mg (has no administration in time range)    ED Course/ Medical Decision Making/ A&P   {   Click here for ABCD2, HEART and other calculatorsREFRESH  Note before signing :1}                              Medical Decision Making Amount and/or Complexity of Data Reviewed Independent Historian:     Details: brother Labs: ordered. Radiology: ordered and independent interpretation performed. ECG/medicine tests: ordered and independent interpretation performed.  Risk Prescription drug management.   ***  {Document critical care time when appropriate:1} {Document review of labs and clinical decision tools ie heart score, Chads2Vasc2 etc:1}  {Document your independent review of radiology images, and any outside records:1} {Document your discussion with family members, caretakers, and with consultants:1} {Document social determinants of health affecting pt's care:1} {Document your decision making why or why not admission, treatments were needed:1} Final Clinical Impression(s) / ED Diagnoses Final diagnoses:  None    Rx / DC Orders ED Discharge Orders     None

## 2023-04-19 NOTE — ED Notes (Signed)
 ED Provider at bedside to update POA and pt on plan of care at this time

## 2023-04-19 NOTE — ED Notes (Signed)
 Patient transported to CT

## 2023-04-19 NOTE — H&P (View-Only) (Signed)
 Urology Consult   Reason for consult: right ureteral stone, AKI, pain  History of Present Illness: Luke Foster is a 73 y.o. M with right uretral stones. Patient presented to ED c/o severe R back pain.  CT scan was done showing multiple obstructing right ureteral calculi  Known to GU for hx of urinary retention managed with foley, urgency incontinence managed with botox injections with Dr Alvester Morin.   Labs in ED show increase in Cr (baseline ~1.3 ->1.88), mild leukocytosis (10.9). Urine positive for LE, blood. Afebrile.   CT shows 2 stones in R ureter - 1 cm just inside the pelvic inlet, 6 mm a few cm proximal to the UVJ. There are a few fragments in the right kidney, largest ~7 mm. Small left sided stone. Prostate is enlarged  On my exam today, patient still endorses severe R back/flank pain and nausea.   Past Medical History:  Diagnosis Date   Arthritis    Chronic back pain    Chronic pain 1999   Bilateral feet (R >L)   Difficult or painful urination    High blood pressure    History of kidney stones    H/O   Nerve damage    Pain management    Paresthesia of foot, bilateral    Renal disorder    Sleep apnea    USES CPAP   Weakness of both legs     Past Surgical History:  Procedure Laterality Date   arm surgery     BACK SURGERY     X2   BOTOX INJECTION N/A 10/06/2021   Procedure: CYSTOSCOPY LITHOLAPAXY WITH BOTOX INJECTION;  Surgeon: Crista Elliot, MD;  Location: [facility];  Service: Urology;  Laterality: N/A;   BOTOX INJECTION N/A 04/20/2022   Procedure: CYSTOSCOPY & BOTOX INJECTION, BLADDER FULGARATION;  Surgeon: Crista Elliot, MD;  Location: [facility];  Service: Urology;  Laterality: N/A;  45 MINS   BOTOX INJECTION N/A 12/28/2022   Procedure: CYSTOSCOPY WITH INTRA DETRUSOR BOTOX INJECTION;  Surgeon: Crista Elliot, MD;  Location: [facility];  Service: Urology;  Laterality: N/A;  45 MINS FOR CASE   BOWEL RESECTION N/A 01/15/2022   Procedure: SMALL BOWEL RESECTION;   Surgeon: Lewie Chamber, DO;  Location: [facility];  Service: General;  Laterality: N/A;   CYSTOSCOPY W/ URETERAL STENT PLACEMENT Right 11/29/2021   Procedure: CYSTOSCOPY WITH RETROGRADE PYELOGRAM/URETERAL STENT PLACEMENT;  Surgeon: Bjorn Pippin, MD;  Location: [facility];  Service: Urology;  Laterality: Right;   CYSTOSCOPY/URETEROSCOPY/HOLMIUM LASER/STENT PLACEMENT Right 01/05/2022   Procedure: CYSTOSCOPY RIGHT URETEROSCOPY/HOLMIUM LASER/STENT PLACEMENT basket extraction stones;  Surgeon: Crista Elliot, MD;  Location: [facility];  Service: Urology;  Laterality: Right;  1 HR FOR CASE   FACIAL COSMETIC SURGERY     HERNIA REPAIR     IR US GUIDE BX ASP/DRAIN  10/19/2017   KIDNEY STONE SURGERY     LAPAROTOMY N/A 01/15/2022   Procedure: Exploratory laparotomy;  Surgeon: Lewie Chamber, DO;  Location: [facility];  Service: General;  Laterality: N/A;   LUMBAR SPINAL CORD SIMULATOR LEAD REMOVAL N/A 03/01/2019   Procedure: LUMBAR SPINAL CORD SIMULATOR LEAD REMOVAL;  Surgeon: Venetia Night, MD;  Location: [facility];  Service: Neurosurgery;  Laterality: N/A;   MASTECTOMY Left    SPINAL CORD STIMULATOR REMOVAL N/A 03/01/2019   Procedure: LUMBAR SPINAL CORD STIMULATOR REMOVAL;  Surgeon: Venetia Night, MD;  Location: [facility];  Service: Neurosurgery;  Laterality: N/A;   WISDOM TOOTH EXTRACTION  Current Hospital Medications:  Home Meds:  No current facility-administered medications on file prior to encounter.   Current Outpatient Medications on File Prior to Encounter  Medication Sig Dispense Refill   acetaminophen (TYLENOL) 500 MG tablet Take 1,000 mg by mouth every 6 (six) hours as needed for mild pain or moderate pain.     ascorbic acid (VITAMIN C) 500 MG tablet Take 500 mg by mouth daily.     Cholecalciferol (VITAMIN D) 50 MCG (2000 UT) CAPS Take 2,000 Units by mouth daily.     cyanocobalamin (VITAMIN B12) 1000 MCG tablet Take 1,000 mcg by mouth in the morning and at bedtime.      DILT-XR 120 MG 24 hr capsule Take 120 mg by mouth daily.     docusate sodium (COLACE) 100 MG capsule Take 1 capsule (100 mg total) by mouth in the morning, at noon, and at bedtime. (Patient taking differently: Take 300 mg by mouth at bedtime.) 90 capsule 0   enalapril (VASOTEC) 5 MG tablet Take 5 mg by mouth 2 (two) times daily.     lidocaine (LIDODERM) 5 % Place 1 patch onto the skin daily as needed (pain).     oxyCODONE (ROXICODONE) 15 MG immediate release tablet Take 1 tablet (15 mg total) by mouth every 6 (six) hours as needed for pain. (Patient taking differently: Take 15 mg by mouth every 4 (four) hours as needed for pain.) 30 tablet 0   polyethylene glycol (MIRALAX / GLYCOLAX) 17 g packet Take 17 g by mouth 2 (two) times daily. 14 each 3   rOPINIRole (REQUIP) 0.5 MG tablet Take 0.5 mg by mouth at bedtime as needed (restless legs).     solifenacin (VESICARE) 5 MG tablet Take 5 mg by mouth daily.     sulfamethoxazole-trimethoprim (BACTRIM) 400-80 MG tablet Take 1 tablet by mouth at bedtime.     tamsulosin (FLOMAX) 0.4 MG CAPS capsule Take 1 capsule (0.4 mg total) by mouth every evening. (Patient taking differently: Take 0.4 mg by mouth at bedtime.) 30 capsule 0   venlafaxine XR (EFFEXOR-XR) 75 MG 24 hr capsule Take 1 capsule (75 mg total) by mouth in the morning and at bedtime. 60 capsule 0   Vibegron (GEMTESA PO) Take 50 mg by mouth at bedtime.     mirabegron ER (MYRBETRIQ) 50 MG TB24 tablet Take 1 tablet (50 mg total) by mouth daily. (Patient not taking: Reported on 04/19/2023) 30 tablet 0     Scheduled Meds: Continuous Infusions: PRN Meds:.  Allergies:  Allergies  Allergen Reactions   Erythromycin Itching    Family History  Problem Relation Age of Onset   Heart disease Father        Living, 25   Breast cancer Mother        Living, 70   Hypercholesterolemia Mother    Healthy Brother    Healthy Sister     Social History:  reports that he quit smoking about 31 years ago. His  smoking use included cigarettes. He started smoking about 41 years ago. He has a 20 pack-year smoking history. He quit smokeless tobacco use about 31 years ago. He reports current alcohol use. He reports that he does not use drugs.  ROS: A complete review of systems was performed.  All systems are negative except for pertinent findings as noted.  Physical Exam:  Vital signs in last 24 hours: Temp:  [98.7 F (37.1 C)] 98.7 F (37.1 C) (03/10 2055) Pulse Rate:  [102-118] 118 (03/10 2055)  Resp:  [11-24] 20 (03/10 2045) BP: (114-138)/(71-102) 122/81 (03/10 2045) SpO2:  [93 %-98 %] 95 % (03/10 2055) Weight:  [81.6 kg] 81.6 kg (03/10 1457) Constitutional:  Alert and oriented, No acute distress Cardiovascular: Regular rate and rhythm Respiratory: Normal respiratory effort, Lungs clear bilaterally GI: Abdomen is soft, nontender, nondistended, no abdominal masses Neurologic: Grossly intact, no focal deficits Psychiatric: Normal mood and affect  Laboratory Data:  Recent Labs    04/19/23 1501  WBC 10.9*  HGB 11.5*  HCT 35.9*  PLT 288    Recent Labs    04/19/23 1501  NA 135  K 4.1  CL 105  GLUCOSE 127*  BUN 39*  CALCIUM 10.0  CREATININE 1.88*     Results for orders placed or performed during the hospital encounter of 04/19/23 (from the past 24 hours)  Lactic acid, plasma     Status: None   Collection Time: 04/19/23  3:01 PM  Result Value Ref Range   Lactic Acid, Venous 1.9 0.5 - 1.9 mmol/L  Lactic acid, plasma     Status: None   Collection Time: 04/19/23  3:01 PM  Result Value Ref Range   Lactic Acid, Venous 1.7 0.5 - 1.9 mmol/L  Comprehensive metabolic panel     Status: Abnormal   Collection Time: 04/19/23  3:01 PM  Result Value Ref Range   Sodium 135 135 - 145 mmol/L   Potassium 4.1 3.5 - 5.1 mmol/L   Chloride 105 98 - 111 mmol/L   CO2 16 (L) 22 - 32 mmol/L   Glucose, Bld 127 (H) 70 - 99 mg/dL   BUN 39 (H) 8 - 23 mg/dL   Creatinine, Ser 1.61 (H) 0.61 - 1.24 mg/dL    Calcium 09.6 8.9 - 04.5 mg/dL   Total Protein 8.1 6.5 - 8.1 g/dL   Albumin 3.2 (L) 3.5 - 5.0 g/dL   AST 21 15 - 41 U/L   ALT 22 0 - 44 U/L   Alkaline Phosphatase 85 38 - 126 U/L   Total Bilirubin 0.6 0.0 - 1.2 mg/dL   GFR, Estimated 38 (L) >60 mL/min   Anion gap 14 5 - 15  CBC with Differential     Status: Abnormal   Collection Time: 04/19/23  3:01 PM  Result Value Ref Range   WBC 10.9 (H) 4.0 - 10.5 K/uL   RBC 3.69 (L) 4.22 - 5.81 MIL/uL   Hemoglobin 11.5 (L) 13.0 - 17.0 g/dL   HCT 40.9 (L) 81.1 - 91.4 %   MCV 97.3 80.0 - 100.0 fL   MCH 31.2 26.0 - 34.0 pg   MCHC 32.0 30.0 - 36.0 g/dL   RDW 78.2 95.6 - 21.3 %   Platelets 288 150 - 400 K/uL   nRBC 0.0 0.0 - 0.2 %   Neutrophils Relative % 90 %   Neutro Abs 9.7 (H) 1.7 - 7.7 K/uL   Lymphocytes Relative 4 %   Lymphs Abs 0.5 (L) 0.7 - 4.0 K/uL   Monocytes Relative 5 %   Monocytes Absolute 0.5 0.1 - 1.0 K/uL   Eosinophils Relative 0 %   Eosinophils Absolute 0.0 0.0 - 0.5 K/uL   Basophils Relative 0 %   Basophils Absolute 0.0 0.0 - 0.1 K/uL   Immature Granulocytes 1 %   Abs Immature Granulocytes 0.09 (H) 0.00 - 0.07 K/uL  Troponin I (High Sensitivity)     Status: None   Collection Time: 04/19/23  3:01 PM  Result Value Ref Range   Troponin  I (High Sensitivity) 7 <18 ng/L  Blood gas, venous     Status: Abnormal   Collection Time: 04/19/23  4:05 PM  Result Value Ref Range   pH, Ven 7.34 7.25 - 7.43   pCO2, Ven 37 (L) 44 - 60 mmHg   pO2, Ven <31 (LL) 32 - 45 mmHg   Bicarbonate 20.0 20.0 - 28.0 mmol/L   Acid-base deficit 5.2 (H) 0.0 - 2.0 mmol/L   O2 Saturation 44.1 %   Patient temperature 37.1    Collection site BLOOD RIGHT FOREARM    Drawn by 1610   Beta-hydroxybutyric acid     Status: Abnormal   Collection Time: 04/19/23  4:05 PM  Result Value Ref Range   Beta-Hydroxybutyric Acid 0.35 (H) 0.05 - 0.27 mmol/L  Urinalysis, w/ Reflex to Culture (Infection Suspected) -Urine, Catheterized; Indwelling urinary catheter      Status: Abnormal   Collection Time: 04/19/23  4:23 PM  Result Value Ref Range   Specimen Source URINE, CATHETERIZED    Color, Urine AMBER (A) YELLOW   APPearance CLOUDY (A) CLEAR   Specific Gravity, Urine 1.026 1.005 - 1.030   pH 5.0 5.0 - 8.0   Glucose, UA NEGATIVE NEGATIVE mg/dL   Hgb urine dipstick LARGE (A) NEGATIVE   Bilirubin Urine NEGATIVE NEGATIVE   Ketones, ur NEGATIVE NEGATIVE mg/dL   Protein, ur 960 (A) NEGATIVE mg/dL   Nitrite NEGATIVE NEGATIVE   Leukocytes,Ua MODERATE (A) NEGATIVE   RBC / HPF >50 0 - 5 RBC/hpf   WBC, UA >50 0 - 5 WBC/hpf   Bacteria, UA RARE (A) NONE SEEN   Squamous Epithelial / HPF 0-5 0 - 5 /HPF   WBC Clumps PRESENT    Mucus PRESENT    No results found for this or any previous visit (from the past 240 hours).  Renal Function: Recent Labs    04/19/23 1501  CREATININE 1.88*   Estimated Creatinine Clearance: 34.9 mL/min (A) (by C-G formula based on SCr of 1.88 mg/dL (H)).  Radiologic Imaging: CT ABDOMEN PELVIS W CONTRAST Result Date: 04/19/2023 CLINICAL DATA:  Fall with low back pain green urine in bag EXAM: CT ABDOMEN AND PELVIS WITH CONTRAST TECHNIQUE: Multidetector CT imaging of the abdomen and pelvis was performed using the standard protocol following bolus administration of intravenous contrast. RADIATION DOSE REDUCTION: This exam was performed according to the departmental dose-optimization program which includes automated exposure control, adjustment of the mA and/or kV according to patient size and/or use of iterative reconstruction technique. CONTRAST:  80mL OMNIPAQUE IOHEXOL 300 MG/ML  SOLN COMPARISON:  CT 11/03/2022 FINDINGS: Lower chest: Lung bases demonstrate atelectasis and or scarring at the lung bases. No acute airspace disease Hepatobiliary: Distended gallbladder. No calcified stone or biliary dilatation Pancreas: Atrophic.  No inflammation Spleen: Normal in size without focal abnormality. Adrenals/Urinary Tract: Adrenal glands are  normal. Bilateral renal cysts, on the right measures up to 14 cm, no specific imaging follow-up is recommended. Bilateral kidney stones, on the left measuring up to 3 mm and on the right measuring up to 4 mm. New moderate right hydronephrosis and hydroureter, secondary to a 9 mm stone in the distal right ureter at the level of L5-S1. There is an additional stone within the ureter distal to the obstructing stone, this measures 6 mm and is visual about 2 cm proximal to the left UVJ. There is perinephric fluid and stranding on the right in addition to mild urothelial enhancement. Delayed excretion of contrast from right kidney consistent with  obstruction. Bladder is decompressed by Foley catheter. Diffuse bladder wall thickening with perivesical stranding. Small calcifications within the bladder probably due to stones Stomach/Bowel: Stomach nonenlarged. No dilated small bowel. No acute bowel wall thickening. Vascular/Lymphatic: Aortic atherosclerosis. No enlarged abdominal or pelvic lymph nodes. Reproductive: Enlarged prostate Other: Negative for pelvic effusion or free air Musculoskeletal: See separately dictated lumbar spine CT report. No acute osseous findings could in the pelvis. IMPRESSION: 1. Moderate right hydronephrosis and hydroureter, secondary to a 9 mm stone in the distal right ureter at the level of L5-S1. There is an additional 6 mm stone within the ureter distal to the obstructing stone. There is perinephric fluid and stranding on the right in addition to mild urothelial enhancement raising possibility of superimposed infection. 2. Bilateral kidney stones. Bladder is decompressed by Foley catheter. Diffuse bladder wall thickening with perivesical stranding, question cystitis. Small calcifications within the bladder probably due to stones. 3. Enlarged prostate. 4. Aortic atherosclerosis. Electronically Signed   By: Jasmine Pang M.D.   On: 04/19/2023 20:17   CT L-SPINE NO CHARGE Result Date:  04/19/2023 CLINICAL DATA:  Back pain, fall 2 days ago. EXAM: CT LUMBAR SPINE WITHOUT CONTRAST TECHNIQUE: Multidetector CT imaging of the lumbar spine was performed without intravenous contrast administration. Multiplanar CT image reconstructions were also generated. RADIATION DOSE REDUCTION: This exam was performed according to the departmental dose-optimization program which includes automated exposure control, adjustment of the mA and/or kV according to patient size and/or use of iterative reconstruction technique. COMPARISON:  MRI lumbar spine 03/22/2019, CT lumbar spine 10/24/2018 FINDINGS: Segmentation: 5 lumbar type vertebrae. Alignment: Lumbar lordosis is maintained.  No listhesis. Vertebrae: Diffuse osteopenia. Similar irregularity of the T12 superior endplate with moderate height loss centrally. Additional irregularities of the L1 superior and inferior endplates with mild height loss anteriorly is similar to prior. No additional compression deformities or evidence of displaced fracture on the current study. Posterior instrumented fusion from L4-S1 with bilateral pedicle screws and vertical interlocking rods. Hardware is intact. Mature osseous fusion noted from L4-S1. Paraspinal and other soft tissues: The paraspinal musculature is unremarkable. Large partially visualized cystic lesion involving the right kidney better evaluated on same day CT abdomen pelvis. Additional partially visualized left renal cyst. Multiple calculi noted in the right kidney with prominence of the right renal collecting system. Atherosclerosis of the abdominal aorta and branch vessels. Degenerative changes of the bilateral sacroiliac joints. Disc levels: Intervertebral disc spaces are maintained. There is a disc bulge at L1-2 along the posterior osteophytes more pronounced along the left without significant osseous spinal canal stenosis. Additional disc bulge and facet arthrosis at L2-3 resulting in mild spinal canal stenosis. Disc  bulge and facet arthrosis at L3-4 resulting in mild spinal canal stenosis. Disc bulge and facet osteophytes contributing to mild right and mild-to-moderate left foraminal stenosis at L3-4. No definite spinal canal narrowing at L4-5 or L5-S1. IMPRESSION: 1. No acute fracture or traumatic malalignment of the lumbar spine. 2. Similar chronic compression deformities of T12 and L1. No retropulsion. 3. Posterior instrumented fusion from L4-S1 without evidence of hardware complication. 4. Multilevel degenerative changes of the lumbar spine as described. Electronically Signed   By: Emily Filbert M.D.   On: 04/19/2023 20:11   CT Head Wo Contrast Result Date: 04/19/2023 CLINICAL DATA:  Altered mental status EXAM: CT HEAD WITHOUT CONTRAST TECHNIQUE: Contiguous axial images were obtained from the base of the skull through the vertex without intravenous contrast. RADIATION DOSE REDUCTION: This exam was performed according to  the departmental dose-optimization program which includes automated exposure control, adjustment of the mA and/or kV according to patient size and/or use of iterative reconstruction technique. COMPARISON:  04/29/2020 FINDINGS: Brain: There is no mass, hemorrhage or extra-axial collection. There is generalized atrophy without lobar predilection. Hypodensity of the white matter is most commonly associated with chronic microvascular disease. Vascular: No hyperdense vessel or unexpected vascular calcification. Skull: The visualized skull base, calvarium and extracranial soft tissues are normal. Sinuses/Orbits: Partial sphenoid sinus opacification. Normal orbits. Other: None. IMPRESSION: 1. No acute intracranial abnormality. 2. Generalized atrophy and findings of chronic microvascular disease. Electronically Signed   By: Deatra Robinson M.D.   On: 04/19/2023 20:03   DG Chest 2 View Result Date: 04/19/2023 CLINICAL DATA:  Sepsis.  Back pain. EXAM: CHEST - 2 VIEW COMPARISON:  11/03/2022 FINDINGS: Two views of  the chest demonstrate low lung volumes. Linear densities in left lower lung are suggestive for atelectasis. Linear densities in the right lung may also represent atelectasis. Heart size is normal. No large pleural effusions. Negative for a pneumothorax. IMPRESSION: Low lung volumes with bilateral atelectasis. Electronically Signed   By: Richarda Overlie M.D.   On: 04/19/2023 18:05    I independently reviewed the above imaging studies.  Impression/Recommendation 73 yo M with multiple right ureteral calculi, R hydronephrosis, AKI, refractory pain  Discussed options and recommended to right ureteral stent in OR. Procedure and risks reviewed with patient, including hematuria, retention, UTI, and the need for a staged ureteroscopy.   Patient and brother voiced understanding and all questions answered  Irine Seal MD 04/19/2023, 9:54 PM  [facility]  Pager: [phone]

## 2023-04-20 ENCOUNTER — Inpatient Hospital Stay (HOSPITAL_COMMUNITY)

## 2023-04-20 ENCOUNTER — Encounter (HOSPITAL_COMMUNITY)
Admission: EM | Disposition: A | Discharge status: Home Health | Payer: Self-pay | Source: Home / Self Care | Attending: Internal Medicine

## 2023-04-20 ENCOUNTER — Encounter (HOSPITAL_COMMUNITY): Payer: Self-pay | Admitting: Family Medicine

## 2023-04-20 ENCOUNTER — Inpatient Hospital Stay (HOSPITAL_COMMUNITY): Admitting: Anesthesiology

## 2023-04-20 DIAGNOSIS — N12 Tubulo-interstitial nephritis, not specified as acute or chronic: Secondary | ICD-10-CM | POA: Diagnosis not present

## 2023-04-20 DIAGNOSIS — N1330 Unspecified hydronephrosis: Secondary | ICD-10-CM | POA: Diagnosis not present

## 2023-04-20 DIAGNOSIS — N132 Hydronephrosis with renal and ureteral calculous obstruction: Secondary | ICD-10-CM | POA: Diagnosis not present

## 2023-04-20 DIAGNOSIS — N202 Calculus of kidney with calculus of ureter: Secondary | ICD-10-CM

## 2023-04-20 HISTORY — PX: CYSTOSCOPY WITH STENT PLACEMENT: SHX5790

## 2023-04-20 LAB — CBC
HCT: 34.5 % — ABNORMAL LOW (ref 39.0–52.0)
Hemoglobin: 10.7 g/dL — ABNORMAL LOW (ref 13.0–17.0)
MCH: 30.8 pg (ref 26.0–34.0)
MCHC: 31 g/dL (ref 30.0–36.0)
MCV: 99.4 fL (ref 80.0–100.0)
Platelets: 320 10*3/uL (ref 150–400)
RBC: 3.47 MIL/uL — ABNORMAL LOW (ref 4.22–5.81)
RDW: 14.1 % (ref 11.5–15.5)
WBC: 9.5 10*3/uL (ref 4.0–10.5)
nRBC: 0 % (ref 0.0–0.2)

## 2023-04-20 LAB — COMPREHENSIVE METABOLIC PANEL
ALT: 20 U/L (ref 0–44)
AST: 18 U/L (ref 15–41)
Albumin: 2.7 g/dL — ABNORMAL LOW (ref 3.5–5.0)
Alkaline Phosphatase: 83 U/L (ref 38–126)
Anion gap: 10 (ref 5–15)
BUN: 36 mg/dL — ABNORMAL HIGH (ref 8–23)
CO2: 19 mmol/L — ABNORMAL LOW (ref 22–32)
Calcium: 9.3 mg/dL (ref 8.9–10.3)
Chloride: 105 mmol/L (ref 98–111)
Creatinine, Ser: 1.75 mg/dL — ABNORMAL HIGH (ref 0.61–1.24)
GFR, Estimated: 41 mL/min — ABNORMAL LOW (ref >60)
Glucose, Bld: 100 mg/dL — ABNORMAL HIGH (ref 70–99)
Potassium: 4.4 mmol/L (ref 3.5–5.1)
Sodium: 134 mmol/L — ABNORMAL LOW (ref 135–145)
Total Bilirubin: 0.7 mg/dL (ref 0.0–1.2)
Total Protein: 6.7 g/dL (ref 6.5–8.1)

## 2023-04-20 SURGERY — CYSTOSCOPY, WITH STENT INSERTION
Anesthesia: General | Laterality: Right

## 2023-04-20 MED ORDER — FENTANYL CITRATE (PF) 100 MCG/2ML IJ SOLN
INTRAMUSCULAR | Status: AC
  Filled 2023-04-20: qty 2

## 2023-04-20 MED ORDER — OXYCODONE HCL 5 MG PO TABS
ORAL_TABLET | ORAL | Status: AC
  Filled 2023-04-20: qty 1

## 2023-04-20 MED ORDER — FENTANYL CITRATE PF 50 MCG/ML IJ SOSY
PREFILLED_SYRINGE | INTRAMUSCULAR | Status: AC
  Filled 2023-04-20: qty 1

## 2023-04-20 MED ORDER — SODIUM CHLORIDE 0.9 % IR SOLN
Status: DC | PRN
  Administered 2023-04-20: 3000 mL via INTRAVESICAL

## 2023-04-20 MED ORDER — LACTATED RINGERS IV SOLN: INTRAVENOUS | Status: DC

## 2023-04-20 MED ORDER — STERILE WATER FOR IRRIGATION IR SOLN
Status: DC | PRN
  Administered 2023-04-20: 500 mL

## 2023-04-20 MED ORDER — ONDANSETRON HCL 4 MG/2ML IJ SOLN: 4.0000 mg | Freq: Once | INTRAMUSCULAR | Status: DC | PRN

## 2023-04-20 MED ORDER — DEXAMETHASONE SODIUM PHOSPHATE 10 MG/ML IJ SOLN
INTRAMUSCULAR | Status: DC | PRN
  Administered 2023-04-20: 10 mg via INTRAVENOUS

## 2023-04-20 MED ORDER — LIDOCAINE HCL (PF) 2 % IJ SOLN
INTRAMUSCULAR | Status: DC | PRN
  Administered 2023-04-20: 100 mg via INTRADERMAL

## 2023-04-20 MED ORDER — ONDANSETRON HCL 4 MG/2ML IJ SOLN
INTRAMUSCULAR | Status: DC | PRN
  Administered 2023-04-20: 4 mg via INTRAVENOUS

## 2023-04-20 MED ORDER — DEXAMETHASONE SODIUM PHOSPHATE 10 MG/ML IJ SOLN
INTRAMUSCULAR | Status: AC
  Filled 2023-04-20: qty 1

## 2023-04-20 MED ORDER — FENTANYL CITRATE (PF) 100 MCG/2ML IJ SOLN
INTRAMUSCULAR | Status: DC | PRN
Start: 2023-04-20 — End: 2023-04-20
  Administered 2023-04-20: 100 ug via INTRAVENOUS

## 2023-04-20 MED ORDER — ACETAMINOPHEN 160 MG/5ML PO SOLN: 325.0000 mg | ORAL | Status: DC | PRN

## 2023-04-20 MED ORDER — OXYCODONE HCL 5 MG PO TABS
5.0000 mg | ORAL_TABLET | Freq: Once | ORAL | Status: AC | PRN
  Administered 2023-04-20: 5 mg via ORAL

## 2023-04-20 MED ORDER — ACETAMINOPHEN 325 MG PO TABS: 325.0000 mg | ORAL_TABLET | ORAL | Status: DC | PRN

## 2023-04-20 MED ORDER — LIDOCAINE HCL (PF) 2 % IJ SOLN
INTRAMUSCULAR | Status: AC
  Filled 2023-04-20: qty 5

## 2023-04-20 MED ORDER — PROPOFOL 10 MG/ML IV BOLUS
INTRAVENOUS | Status: AC
  Filled 2023-04-20: qty 20

## 2023-04-20 MED ORDER — CHLORHEXIDINE GLUCONATE CLOTH 2 % EX PADS
6.0000 | MEDICATED_PAD | Freq: Every day | CUTANEOUS | Status: DC
  Administered 2023-04-20 – 2023-05-01 (×12): 6 via TOPICAL

## 2023-04-20 MED ORDER — IOHEXOL 300 MG/ML  SOLN
INTRAMUSCULAR | Status: DC | PRN
  Administered 2023-04-20: 19 mL

## 2023-04-20 MED ORDER — PROPOFOL 10 MG/ML IV BOLUS
INTRAVENOUS | Status: DC | PRN
  Administered 2023-04-20: 70 mg via INTRAVENOUS
  Administered 2023-04-20: 80 mg via INTRAVENOUS

## 2023-04-20 MED ORDER — ONDANSETRON HCL 4 MG/2ML IJ SOLN
INTRAMUSCULAR | Status: AC
  Filled 2023-04-20: qty 2

## 2023-04-20 MED ORDER — PHENYLEPHRINE 80 MCG/ML (10ML) SYRINGE FOR IV PUSH (FOR BLOOD PRESSURE SUPPORT)
PREFILLED_SYRINGE | INTRAVENOUS | Status: AC
  Filled 2023-04-20: qty 10

## 2023-04-20 MED ORDER — OXYCODONE HCL 5 MG/5ML PO SOLN: 5.0000 mg | Freq: Once | ORAL | Status: AC | PRN

## 2023-04-20 MED ORDER — METHOCARBAMOL 500 MG PO TABS
500.0000 mg | ORAL_TABLET | Freq: Four times a day (QID) | ORAL | Status: DC | PRN
  Administered 2023-04-20 – 2023-04-21 (×4): 500 mg via ORAL
  Filled 2023-04-20 (×4): qty 1

## 2023-04-20 MED ORDER — FENTANYL CITRATE PF 50 MCG/ML IJ SOSY
50.0000 ug | PREFILLED_SYRINGE | Freq: Once | INTRAMUSCULAR | Status: AC
  Administered 2023-04-20: 50 ug via INTRAVENOUS
  Filled 2023-04-20: qty 1

## 2023-04-20 MED ORDER — MEPERIDINE HCL 50 MG/ML IJ SOLN: 6.2500 mg | INTRAMUSCULAR | Status: DC | PRN

## 2023-04-20 MED ORDER — PHENYLEPHRINE 80 MCG/ML (10ML) SYRINGE FOR IV PUSH (FOR BLOOD PRESSURE SUPPORT)
PREFILLED_SYRINGE | INTRAVENOUS | Status: DC | PRN
  Administered 2023-04-20: 160 ug via INTRAVENOUS

## 2023-04-20 MED ORDER — FENTANYL CITRATE PF 50 MCG/ML IJ SOSY
25.0000 ug | PREFILLED_SYRINGE | INTRAMUSCULAR | Status: DC | PRN
  Administered 2023-04-20: 50 ug via INTRAVENOUS
  Administered 2023-04-20 (×2): 25 ug via INTRAVENOUS

## 2023-04-20 MED ORDER — SODIUM CHLORIDE 0.9 % IV SOLN: INTRAVENOUS | Status: DC

## 2023-04-20 SURGICAL SUPPLY — 14 items
BAG URINE DRAIN 2000ML AR STRL (UROLOGICAL SUPPLIES) IMPLANT
BAG URO CATCHER STRL LF (MISCELLANEOUS) ×1 IMPLANT
CATH URETL OPEN 5X70 (CATHETERS) ×1 IMPLANT
CATH URTH STD 16FR FL 2W DRN (CATHETERS) IMPLANT
CLOTH BEACON ORANGE TIMEOUT ST (SAFETY) ×1 IMPLANT
GLOVE BIO SURGEON STRL SZ7 (GLOVE) ×1 IMPLANT
GOWN STRL REUS W/ TWL XL LVL3 (GOWN DISPOSABLE) ×1 IMPLANT
GUIDEWIRE ZIPWRE .038 STRAIGHT (WIRE) ×1 IMPLANT
HOLDER FOLEY CATH W/STRAP (MISCELLANEOUS) IMPLANT
KIT TURNOVER KIT A (KITS) IMPLANT
MANIFOLD NEPTUNE II (INSTRUMENTS) ×1 IMPLANT
PACK CYSTO (CUSTOM PROCEDURE TRAY) ×1 IMPLANT
STENT URET 6FRX26 CONTOUR (STENTS) IMPLANT
TUBING CONNECTING 10 (TUBING) ×1 IMPLANT

## 2023-04-20 NOTE — Anesthesia Preprocedure Evaluation (Addendum)
 Anesthesia Evaluation  Patient identified by MRN, date of birth, ID band Patient awake    Reviewed: Allergy & Precautions, NPO status , Patient's Chart, lab work & pertinent test results  Airway Mallampati: I  TM Distance: >3 FB Neck ROM: Full    Dental  (+) Teeth Intact, Dental Advisory Given   Pulmonary sleep apnea , former smoker   breath sounds clear to auscultation       Cardiovascular hypertension, Pt. on medications  Rhythm:Regular Rate:Normal     Neuro/Psych  PSYCHIATRIC DISORDERS Anxiety Depression   Dementia  Neuromuscular disease    GI/Hepatic negative GI ROS, Neg liver ROS,,,  Endo/Other  negative endocrine ROS    Renal/GU Renal disease     Musculoskeletal  (+) Arthritis ,    Abdominal   Peds  Hematology negative hematology ROS (+)   Anesthesia Other Findings   Reproductive/Obstetrics                             Anesthesia Physical Anesthesia Plan  ASA: 2  Anesthesia Plan: General   Post-op Pain Management: Tylenol PO (pre-op)*   Induction: Intravenous  PONV Risk Score and Plan: 3 and Ondansetron, Dexamethasone and Treatment may vary due to age or medical condition  Airway Management Planned: LMA  Additional Equipment: None  Intra-op Plan:   Post-operative Plan: Extubation in OR  Informed Consent: I have reviewed the patients History and Physical, chart, labs and discussed the procedure including the risks, benefits and alternatives for the proposed anesthesia with the patient or authorized representative who has indicated his/her understanding and acceptance.   Patient has DNR.  Discussed DNR with power of attorney and Discussed DNR with patient.   Dental advisory given  Plan Discussed with: CRNA and Anesthesiologist  Anesthesia Plan Comments: (Meds and ventilation NO CHEST COMPRESSIONS)       Anesthesia Quick Evaluation

## 2023-04-20 NOTE — ED Notes (Signed)
 Pt POA, [name], notified of transfer and pt room number

## 2023-04-20 NOTE — Progress Notes (Signed)
 PROGRESS NOTE  Luke Foster  UJW:[id number] DOB: 01/23/1951 DOA: 04/19/2023 PCP: Elfredia Nevins, MD   Brief Narrative: Patient is a 73 year old male with history of chronic back pain, chronic Foley catheter for urinary retention, nephrolithiasis, hypertension, depression, dementia who presented from home with complaint of worsening back pain.  History of fall about a week ago resulting in increased back pain, worsening confusion.  On presentation, lab work showed creatinine of 1.8, WC count of 10.9.  CT imaging of the head did not show any acute findings.  CT abdomen/pelvis with contrast showed moderate right hydronephrosis, hydroureter, 9 mm obstructing stone in the distal right ureter, additional 6 mm stone, possible pyelonephritis .  Started on IV antibiotic.  Urology consulted, undergoing right ureteral stent today  Assessment & Plan:  Principal Problem:   Pyelonephritis Active Problems:   Neuropathy   Chronic pain syndrome   Status post insertion of spinal cord stimulator   Hypertension   Degenerative disc disease, lumbar   Urinary retention   Obstructive uropathy   Anxiety   Dementia (HCC)   Right-sided nephrolithiasis: Presented with back pain.  CT abdomen/pelvis showed moderate right hydronephrosis and hydroureter, secondary to a 9 mm stone in the distal right ureter at the level of L5-S1.Also found to have an additional 6 mm stone within the ureter distal to the obstructing stone. There is perinephric fluid and stranding on the right in addition to mild urothelial enhancement raising possibility of superimposed infection. Imaging also showed features of cystitis, enlarged prostate.  Urology consulted.  Plan for right-sided ureteral stent placement today.  Suspected pyelonephritis :started on ceftriaxone.  Currently afebrile.  Follow-up urine culture, blood culture.  Leukocytosis resolved  Chronic urinary retention: On Foley.  On Flomax,vibegron  FAO:ZHYQMVHQ creatinine  normal.  Continue gentle IV fluids  Chronic back pain: No acute findings on the CT lumbar spine.  Showed chronic compression deformities of T12 and L1. No retropulsion.Posterior instrumented fusion from L4-S1 without evidence of hardware complication. On lidocaine patch.  On oxycodone.  Dementia: CT head did not show any acute findings but showed generalized atrophy and findings of chronic microvascular disease.  Currently alert and oriented.  Continue deleirium precaution, frequent reorientation  Anxiety/restless leg syndrome: On Effexor, Requip  Hypertension: On diltiazem, enalapril.  ARB on hold due to AKI        DVT prophylaxis:SCDs Start: 04/19/23 2245     Code Status: Full Code  Family Communication: Brother at bedside on 3/11  Patient status:Inpatient  Patient is from :home  Anticipated discharge IO:NGEX  Estimated DC date:1-2 days   Consultants: Urology  Procedures: Plan for right-sided ureteral placement  Antimicrobials:  Anti-infectives (From admission, onward)    Start     Dose/Rate Route Frequency Ordered Stop   04/20/23 1800  cefTRIAXone (ROCEPHIN) 1 g in sodium chloride 0.9 % 100 mL IVPB        1 g 200 mL/hr over 30 Minutes Intravenous Every 24 hours 04/19/23 2244     04/19/23 1815  cefTRIAXone (ROCEPHIN) 1 g in sodium chloride 0.9 % 100 mL IVPB        1 g 200 mL/hr over 30 Minutes Intravenous  Once 04/19/23 1806 04/19/23 2015       Subjective: Patient seen and examined at bedside today.  Hemodynamically stable lying in bed.  Complains of right-sided abdominal pain.  On moderate distress.  Brother at bedside.  No nausea or vomiting  Objective: Vitals:   04/20/23 0000 04/20/23 0148 04/20/23 0355 04/20/23  0743  BP: 129/76 121/79 124/81 (!) 140/75  Pulse: (!) 116 (!) 116 (!) 115 (!) 110  Resp: 18 18 19 18   Temp:  99 F (37.2 C) 99.8 F (37.7 C) 100 F (37.8 C)  TempSrc:  Oral Oral Oral  SpO2: 95% 99% 98% 98%  Weight:      Height:         Intake/Output Summary (Last 24 hours) at 04/20/2023 0749 Last data filed at 04/20/2023 0700 Gross per 24 hour  Intake 0 ml  Output 800 ml  Net -800 ml   Filed Weights   04/19/23 1457  Weight: 81.6 kg    Examination:  General exam: Overall comfortable, not in distress HEENT: PERRL Respiratory system:  no wheezes or crackles  Cardiovascular system: S1 & S2 heard, RRR.  Gastrointestinal system: Abdomen is nondistended, soft .  Tender in the right lower quadrant Central nervous system: Alert and oriented Extremities: No edema, no clubbing ,no cyanosis Skin: No rashes, no ulcers,no icterus   GU: Foley catheter   Data Reviewed: I have personally reviewed following labs and imaging studies  CBC: Recent Labs  Lab 04/19/23 1501  WBC 10.9*  NEUTROABS 9.7*  HGB 11.5*  HCT 35.9*  MCV 97.3  PLT 288   Basic Metabolic Panel: Recent Labs  Lab 04/19/23 1501 04/20/23 0450  NA 135 134*  K 4.1 4.4  CL 105 105  CO2 16* 19*  GLUCOSE 127* 100*  BUN 39* 36*  CREATININE 1.88* 1.75*  CALCIUM 10.0 9.3     No results found for this or any previous visit (from the past 240 hours).   Radiology Studies: CT ABDOMEN PELVIS W CONTRAST Result Date: 04/19/2023 CLINICAL DATA:  Fall with low back pain green urine in bag EXAM: CT ABDOMEN AND PELVIS WITH CONTRAST TECHNIQUE: Multidetector CT imaging of the abdomen and pelvis was performed using the standard protocol following bolus administration of intravenous contrast. RADIATION DOSE REDUCTION: This exam was performed according to the departmental dose-optimization program which includes automated exposure control, adjustment of the mA and/or kV according to patient size and/or use of iterative reconstruction technique. CONTRAST:  80mL OMNIPAQUE IOHEXOL 300 MG/ML  SOLN COMPARISON:  CT 11/03/2022 FINDINGS: Lower chest: Lung bases demonstrate atelectasis and or scarring at the lung bases. No acute airspace disease Hepatobiliary: Distended  gallbladder. No calcified stone or biliary dilatation Pancreas: Atrophic.  No inflammation Spleen: Normal in size without focal abnormality. Adrenals/Urinary Tract: Adrenal glands are normal. Bilateral renal cysts, on the right measures up to 14 cm, no specific imaging follow-up is recommended. Bilateral kidney stones, on the left measuring up to 3 mm and on the right measuring up to 4 mm. New moderate right hydronephrosis and hydroureter, secondary to a 9 mm stone in the distal right ureter at the level of L5-S1. There is an additional stone within the ureter distal to the obstructing stone, this measures 6 mm and is visual about 2 cm proximal to the left UVJ. There is perinephric fluid and stranding on the right in addition to mild urothelial enhancement. Delayed excretion of contrast from right kidney consistent with obstruction. Bladder is decompressed by Foley catheter. Diffuse bladder wall thickening with perivesical stranding. Small calcifications within the bladder probably due to stones Stomach/Bowel: Stomach nonenlarged. No dilated small bowel. No acute bowel wall thickening. Vascular/Lymphatic: Aortic atherosclerosis. No enlarged abdominal or pelvic lymph nodes. Reproductive: Enlarged prostate Other: Negative for pelvic effusion or free air Musculoskeletal: See separately dictated lumbar spine CT report. No  acute osseous findings could in the pelvis. IMPRESSION: 1. Moderate right hydronephrosis and hydroureter, secondary to a 9 mm stone in the distal right ureter at the level of L5-S1. There is an additional 6 mm stone within the ureter distal to the obstructing stone. There is perinephric fluid and stranding on the right in addition to mild urothelial enhancement raising possibility of superimposed infection. 2. Bilateral kidney stones. Bladder is decompressed by Foley catheter. Diffuse bladder wall thickening with perivesical stranding, question cystitis. Small calcifications within the bladder probably  due to stones. 3. Enlarged prostate. 4. Aortic atherosclerosis. Electronically Signed   By: Jasmine Pang M.D.   On: 04/19/2023 20:17   CT L-SPINE NO CHARGE Result Date: 04/19/2023 CLINICAL DATA:  Back pain, fall 2 days ago. EXAM: CT LUMBAR SPINE WITHOUT CONTRAST TECHNIQUE: Multidetector CT imaging of the lumbar spine was performed without intravenous contrast administration. Multiplanar CT image reconstructions were also generated. RADIATION DOSE REDUCTION: This exam was performed according to the departmental dose-optimization program which includes automated exposure control, adjustment of the mA and/or kV according to patient size and/or use of iterative reconstruction technique. COMPARISON:  MRI lumbar spine 03/22/2019, CT lumbar spine 10/24/2018 FINDINGS: Segmentation: 5 lumbar type vertebrae. Alignment: Lumbar lordosis is maintained.  No listhesis. Vertebrae: Diffuse osteopenia. Similar irregularity of the T12 superior endplate with moderate height loss centrally. Additional irregularities of the L1 superior and inferior endplates with mild height loss anteriorly is similar to prior. No additional compression deformities or evidence of displaced fracture on the current study. Posterior instrumented fusion from L4-S1 with bilateral pedicle screws and vertical interlocking rods. Hardware is intact. Mature osseous fusion noted from L4-S1. Paraspinal and other soft tissues: The paraspinal musculature is unremarkable. Large partially visualized cystic lesion involving the right kidney better evaluated on same day CT abdomen pelvis. Additional partially visualized left renal cyst. Multiple calculi noted in the right kidney with prominence of the right renal collecting system. Atherosclerosis of the abdominal aorta and branch vessels. Degenerative changes of the bilateral sacroiliac joints. Disc levels: Intervertebral disc spaces are maintained. There is a disc bulge at L1-2 along the posterior osteophytes more  pronounced along the left without significant osseous spinal canal stenosis. Additional disc bulge and facet arthrosis at L2-3 resulting in mild spinal canal stenosis. Disc bulge and facet arthrosis at L3-4 resulting in mild spinal canal stenosis. Disc bulge and facet osteophytes contributing to mild right and mild-to-moderate left foraminal stenosis at L3-4. No definite spinal canal narrowing at L4-5 or L5-S1. IMPRESSION: 1. No acute fracture or traumatic malalignment of the lumbar spine. 2. Similar chronic compression deformities of T12 and L1. No retropulsion. 3. Posterior instrumented fusion from L4-S1 without evidence of hardware complication. 4. Multilevel degenerative changes of the lumbar spine as described. Electronically Signed   By: Emily Filbert M.D.   On: 04/19/2023 20:11   CT Head Wo Contrast Result Date: 04/19/2023 CLINICAL DATA:  Altered mental status EXAM: CT HEAD WITHOUT CONTRAST TECHNIQUE: Contiguous axial images were obtained from the base of the skull through the vertex without intravenous contrast. RADIATION DOSE REDUCTION: This exam was performed according to the departmental dose-optimization program which includes automated exposure control, adjustment of the mA and/or kV according to patient size and/or use of iterative reconstruction technique. COMPARISON:  04/29/2020 FINDINGS: Brain: There is no mass, hemorrhage or extra-axial collection. There is generalized atrophy without lobar predilection. Hypodensity of the white matter is most commonly associated with chronic microvascular disease. Vascular: No hyperdense vessel or unexpected vascular  calcification. Skull: The visualized skull base, calvarium and extracranial soft tissues are normal. Sinuses/Orbits: Partial sphenoid sinus opacification. Normal orbits. Other: None. IMPRESSION: 1. No acute intracranial abnormality. 2. Generalized atrophy and findings of chronic microvascular disease. Electronically Signed   By: Deatra Robinson M.D.    On: 04/19/2023 20:03   DG Chest 2 View Result Date: 04/19/2023 CLINICAL DATA:  Sepsis.  Back pain. EXAM: CHEST - 2 VIEW COMPARISON:  11/03/2022 FINDINGS: Two views of the chest demonstrate low lung volumes. Linear densities in left lower lung are suggestive for atelectasis. Linear densities in the right lung may also represent atelectasis. Heart size is normal. No large pleural effusions. Negative for a pneumothorax. IMPRESSION: Low lung volumes with bilateral atelectasis. Electronically Signed   By: Richarda Overlie M.D.   On: 04/19/2023 18:05    Scheduled Meds:  diltiazem  120 mg Oral Daily   docusate sodium  300 mg Oral QHS   enalapril  5 mg Oral BID   mirabegron ER  50 mg Oral QHS   tamsulosin  0.4 mg Oral QPM   venlafaxine XR  75 mg Oral Q breakfast   Continuous Infusions:  cefTRIAXone (ROCEPHIN)  IV       LOS: 1 day   Burnadette Pop, MD [facility] P3/12/2023, 7:49 AM

## 2023-04-20 NOTE — Progress Notes (Signed)
 Pt POA, Luke Foster, is asking the provider to please give him a call at [phone]. He would like an update on his brother Luke Foster, plan of care.

## 2023-04-20 NOTE — Anesthesia Procedure Notes (Signed)
 Procedure Name: LMA Insertion Date/Time: 04/20/2023 12:59 PM  Performed by: Doran Clay, CRNAPre-anesthesia Checklist: Emergency Drugs available, Patient identified, Suction available, Patient being monitored and Timeout performed Patient Re-evaluated:Patient Re-evaluated prior to induction Oxygen Delivery Method: Circle system utilized Induction Type: IV induction LMA: LMA inserted LMA Size: 5.0 Tube type: Oral Number of attempts: 1 Placement Confirmation: positive ETCO2 and breath sounds checked- equal and bilateral Tube secured with: Tape Dental Injury: Teeth and Oropharynx as per pre-operative assessment

## 2023-04-20 NOTE — TOC Initial Note (Signed)
 Transition of Care [facility]) - Initial/Assessment Note    Patient Details  Name: Luke Foster MRN: [id number] Date of Birth: 1950-08-22  [facility]) CM/SW Contact:    Howell Rucks, RN Phone Number: 04/20/2023, 1:56 PM  Clinical Narrative:  Patient off floor for procedure. TOC will follow for initial assessment.                         Patient Goals and CMS Choice            Expected Discharge Plan and Services                                              Prior Living Arrangements/Services                       Activities of Daily Living   ADL Screening (condition at time of admission) Independently performs ADLs?: No Does the patient have a NEW difficulty with bathing/dressing/toileting/self-feeding that is expected to last >3 days?: No Does the patient have a NEW difficulty with getting in/out of bed, walking, or climbing stairs that is expected to last >3 days?: No Does the patient have a NEW difficulty with communication that is expected to last >3 days?: No Is the patient deaf or have difficulty hearing?: Yes Does the patient have difficulty seeing, even when wearing glasses/contacts?: No Does the patient have difficulty concentrating, remembering, or making decisions?: Yes  Permission Sought/Granted                  Emotional Assessment              Admission diagnosis:  Pyelonephritis [N12] Acute kidney injury (HCC) [N17.9] Right ureteral stone [N20.1] Patient Active Problem List   Diagnosis Date Noted   Pyelonephritis 04/19/2023   Dementia (HCC) 04/19/2023   Complicated UTI (urinary tract infection) 06/17/2022   Small bowel obstruction (HCC) 01/12/2022   Leukocytosis 01/12/2022   Anxiety 01/12/2022   Depression 01/12/2022   Restless leg syndrome 01/12/2022   Intractable nausea and vomiting 11/30/2021   Ileus (HCC) 11/30/2021   Obstructive uropathy 11/29/2021   AKI (acute kidney injury) (HCC) 11/29/2021    Generalized weakness 04/30/2020   Constipation 04/30/2020   Nephrolithiasis 04/30/2020   Pressure injury of skin 04/30/2020   Urinary retention 04/29/2020   Pain in limb 05/09/2019   Acute metabolic encephalopathy 03/08/2019   Bilateral hydronephrosis 03/07/2019   Altered mental status 03/07/2019   Hypertension    Abnormal ECG 02/28/2019   Atherosclerosis of abdominal aorta (HCC) 02/28/2019   Benign essential HTN 02/28/2019   Hyperlipidemia, mixed 02/28/2019   Neuropathy 01/21/2017   Pain in right foot 01/21/2017   Pain in left foot 01/21/2017   Chronic pain syndrome 01/21/2017   Post laminectomy syndrome 01/21/2017   Status post insertion of spinal cord stimulator 01/21/2017   Uncomplicated opioid dependence (HCC) 01/21/2017   Chronic bilateral low back pain without sciatica 11/27/2015   Degenerative disc disease, lumbar 06/17/2015   Encounter for therapeutic drug monitoring 06/17/2015   Nervous system device, implant, or graft complication, initial encounter 04/06/2014   Lumbosacral radiculopathy 12/09/2012   PCP:  Elfredia Nevins, MD Pharmacy:   [id number] - [place], [place] - [address] [facility] [address] [place]  [address] Phone: [phone] Fax: [phone]     Social Drivers of Health (SDOH) Social History: SDOH Screenings   Food Insecurity: No Food Insecurity (04/20/2023)  Housing: Low Risk  (04/20/2023)  Transportation Needs: No Transportation Needs (04/20/2023)  Utilities: Not At Risk (04/20/2023)  Social Connections: Moderately Isolated (04/20/2023)  Tobacco Use: Medium Risk (04/20/2023)   SDOH Interventions:     Readmission Risk Interventions    06/19/2022    1:54 PM 01/16/2022    1:14 PM 12/01/2021    3:11 PM  Readmission Risk Prevention Plan  Transportation Screening Complete Complete Complete  PCP or Specialist Appt within 5-7 Days   Complete  Home Care Screening   Complete  Medication Review (RN  CM)   Complete  HRI or Home Care Consult Complete Complete   Social Work Consult for Recovery Care Planning/Counseling Complete Complete   Palliative Care Screening Not Applicable Not Applicable   Medication Review Oceanographer) Complete Complete

## 2023-04-20 NOTE — Plan of Care (Signed)

## 2023-04-20 NOTE — Anesthesia Postprocedure Evaluation (Signed)
 Anesthesia Post Note  Patient: Luke Foster  Procedure(s) Performed: CYSTOSCOPY, RETROGRADE PYELOGRAM, RIGHT STENT INSERTION (Right)     Patient location during evaluation: PACU Anesthesia Type: General Level of consciousness: awake and alert Pain management: pain level controlled Vital Signs Assessment: post-procedure vital signs reviewed and stable Respiratory status: spontaneous breathing, nonlabored ventilation, respiratory function stable and patient connected to nasal cannula oxygen Cardiovascular status: blood pressure returned to baseline and stable Postop Assessment: no apparent nausea or vomiting Anesthetic complications: no   No notable events documented.  Last Vitals:  Vitals:   04/20/23 1400 04/20/23 1415  BP: (!) 146/74 135/74  Pulse: (!) 110 (!) 111  Resp: 20 18  Temp:    SpO2: 95% 94%    Last Pain:  Vitals:   04/20/23 1415  TempSrc:   PainSc: 3                  Jaedah Lords

## 2023-04-20 NOTE — Op Note (Signed)
 Operative Note  Preoperative diagnosis:  1.  Right ureteral stones 2. Right hydronephrosis due to stones 3. Hx of urinary retention  Postoperative diagnosis: 1.  same  Procedure(s): 1.  Cystoscopy 2. right retrograde pyelogram with interpretation 3. Right ureteral stent placement 4. Fluoroscopy <1 hour with intraoperative interpretation  Surgeon: Irine Seal, MD  Assistants:  None  Anesthesia:  General  Complications:  None  EBL:  minimal  Specimens: 1. none  Drains/Catheters: 1.  Right 6Fr x 26cm ureteral stent  Intraoperative findings:   Cystoscopy demonstrated no suspicious lesions, masses, stones or other pathology. Very large median lobe again seen Right Retrograde pyelogram demonstrated hydroureteronephrosis beginning in the distal ureter. There were no filling defects.  Successful right ureteral stent placement with curl in the renal pelvis and bladder respectively.  Indication:  Luke Foster is a 73 y.o. male with the above dx. After reviewing the management options for treatment, he elected to proceed with the above surgical procedure(s). We have discussed the potential benefits and risks of the procedure, side effects of the proposed treatment, the likelihood of the patient achieving the goals of the procedure, and any potential problems that might occur during the procedure or recuperation. Informed consent has been obtained.  Description of procedure: The patient was taken to the operating room and general anesthesia was induced.  The patient was placed in the dorsal lithotomy position, prepped and draped in the usual sterile fashion, and preoperative antibiotics were administered. A preoperative time-out was performed.   Cystourethroscopy was performed.  The patient's urethra was examined and was normal. There was trilobar prostatic hypertrophy. The bladder was then systematically examined in its entirety. There was no evidence for any bladder tumors, stones,  or other mucosal pathology.    Attention then turned to the right ureteral orifice. A 0.038 zip wire was passed through the  orifice and over the wire a 5 Fr open ended catheter was inserted and passed up to the level of the renal pelvis. Omnipaque contrast was injected through the ureteral catheter and a retrograde pyelogram was performed with findings as dictated above. The wire was then replaced and the open ended catheter was removed.   A 6Fr x 26cm ureteral stent was advance over the wire. The stent was positioned appropriately under fluoroscopic and cystoscopic guidance.  The wire was then removed with an adequate stent curl noted in the renal pelvis as well as in the bladder.  The bladder was then emptied and the procedure ended.  The patient appeared to tolerate the procedure well and without complications.  The patient was able to be awakened and transferred to the recovery unit in satisfactory condition.   Plan:  return to floor Will arrange for ureteroscopy to be done as outpatient  Neva Seat MD [facility]  Pager: [phone]

## 2023-04-20 NOTE — Plan of Care (Signed)
 ?  Problem: Coping: ?Goal: Level of anxiety will decrease ?Outcome: Progressing ?  ?Problem: Safety: ?Goal: Ability to remain free from injury will improve ?Outcome: Progressing ?  ?

## 2023-04-20 NOTE — Discharge Instructions (Signed)

## 2023-04-20 NOTE — Transfer of Care (Signed)
 Immediate Anesthesia Transfer of Care Note  Patient: Luke Foster  Procedure(s) Performed: CYSTOSCOPY, RETROGRADE PYELOGRAM, RIGHT STENT INSERTION (Right)  Patient Location: PACU  Anesthesia Type:General  Level of Consciousness: drowsy and patient cooperative  Airway & Oxygen Therapy: Patient Spontanous Breathing and Patient connected to face mask oxygen  Post-op Assessment: Report given to RN and Post -op Vital signs reviewed and stable  Post vital signs: Reviewed and stable  Last Vitals:  Vitals Value Taken Time  BP 128/89 04/20/23 1335  Temp    Pulse 117 04/20/23 1338  Resp 28 04/20/23 1338  SpO2 97 % 04/20/23 1338  Vitals shown include unfiled device data.  Last Pain:  Vitals:   04/20/23 1132  TempSrc:   PainSc: 10-Worst pain ever      Patients Stated Pain Goal: 0 (04/20/23 1132)  Complications: No notable events documented.

## 2023-04-21 ENCOUNTER — Encounter (HOSPITAL_COMMUNITY): Payer: Self-pay | Admitting: Urology

## 2023-04-21 ENCOUNTER — Other Ambulatory Visit: Payer: Self-pay | Admitting: Urology

## 2023-04-21 DIAGNOSIS — N12 Tubulo-interstitial nephritis, not specified as acute or chronic: Secondary | ICD-10-CM | POA: Diagnosis not present

## 2023-04-21 LAB — URINE CULTURE: Culture: 20000 — AB

## 2023-04-21 LAB — CBC
HCT: 28.9 % — ABNORMAL LOW (ref 39.0–52.0)
Hemoglobin: 9.3 g/dL — ABNORMAL LOW (ref 13.0–17.0)
MCH: 31 pg (ref 26.0–34.0)
MCHC: 32.2 g/dL (ref 30.0–36.0)
MCV: 96.3 fL (ref 80.0–100.0)
Platelets: 309 10*3/uL (ref 150–400)
RBC: 3 MIL/uL — ABNORMAL LOW (ref 4.22–5.81)
RDW: 14 % (ref 11.5–15.5)
WBC: 10.5 10*3/uL (ref 4.0–10.5)
nRBC: 0 % (ref 0.0–0.2)

## 2023-04-21 LAB — BASIC METABOLIC PANEL
Anion gap: 12 (ref 5–15)
BUN: 40 mg/dL — ABNORMAL HIGH (ref 8–23)
CO2: 16 mmol/L — ABNORMAL LOW (ref 22–32)
Calcium: 9 mg/dL (ref 8.9–10.3)
Chloride: 105 mmol/L (ref 98–111)
Creatinine, Ser: 1.41 mg/dL — ABNORMAL HIGH (ref 0.61–1.24)
GFR, Estimated: 53 mL/min — ABNORMAL LOW (ref >60)
Glucose, Bld: 135 mg/dL — ABNORMAL HIGH (ref 70–99)
Potassium: 3.9 mmol/L (ref 3.5–5.1)
Sodium: 133 mmol/L — ABNORMAL LOW (ref 135–145)

## 2023-04-21 LAB — TSH: TSH: 0.104 u[IU]/mL — ABNORMAL LOW (ref 0.350–4.500)

## 2023-04-21 MED ORDER — SODIUM BICARBONATE 8.4 % IV SOLN
INTRAVENOUS | Status: DC
  Filled 2023-04-21 (×2): qty 150
  Filled 2023-04-21: qty 1000

## 2023-04-21 MED ORDER — DILTIAZEM HCL ER COATED BEADS 180 MG PO CP24
180.0000 mg | ORAL_CAPSULE | Freq: Every day | ORAL | Status: DC
  Administered 2023-04-22: 180 mg via ORAL
  Filled 2023-04-21 (×2): qty 1

## 2023-04-21 MED ORDER — POLYETHYLENE GLYCOL 3350 17 G PO PACK
17.0000 g | PACK | Freq: Every day | ORAL | Status: DC
  Administered 2023-04-21 – 2023-04-22 (×2): 17 g via ORAL
  Filled 2023-04-21 (×2): qty 1

## 2023-04-21 MED ORDER — SODIUM CHLORIDE 0.9 % IV SOLN
2.0000 g | Freq: Two times a day (BID) | INTRAVENOUS | Status: DC
  Administered 2023-04-21 – 2023-04-23 (×4): 2 g via INTRAVENOUS
  Filled 2023-04-21 (×5): qty 12.5

## 2023-04-21 MED ORDER — METHOCARBAMOL 500 MG PO TABS
1000.0000 mg | ORAL_TABLET | Freq: Four times a day (QID) | ORAL | Status: DC | PRN
  Administered 2023-04-21 – 2023-04-22 (×5): 1000 mg via ORAL
  Filled 2023-04-21 (×5): qty 2

## 2023-04-21 MED ORDER — SENNOSIDES-DOCUSATE SODIUM 8.6-50 MG PO TABS
1.0000 | ORAL_TABLET | Freq: Two times a day (BID) | ORAL | Status: DC
  Administered 2023-04-21 – 2023-04-29 (×10): 1 via ORAL
  Filled 2023-04-21 (×13): qty 1

## 2023-04-21 MED ORDER — HYDROMORPHONE HCL 1 MG/ML IJ SOLN
1.0000 mg | INTRAMUSCULAR | Status: DC | PRN
  Administered 2023-04-21 – 2023-04-27 (×34): 1 mg via INTRAVENOUS
  Filled 2023-04-21 (×34): qty 1

## 2023-04-21 NOTE — Plan of Care (Signed)
  Problem: Education: Goal: Knowledge of General Education information will improve Description: Including pain rating scale, medication(s)/side effects and non-pharmacologic comfort measures Outcome: Progressing   Problem: Activity: Goal: Risk for activity intolerance will decrease Outcome: Progressing   Problem: Coping: Goal: Level of anxiety will decrease Outcome: Progressing   Problem: Nutrition: Goal: Adequate nutrition will be maintained Outcome: Progressing   Problem: Pain Managment: Goal: General experience of comfort will improve and/or be controlled Outcome: Progressing   Problem: Safety: Goal: Ability to remain free from injury will improve Outcome: Progressing

## 2023-04-21 NOTE — Plan of Care (Signed)
  Problem: Clinical Measurements: Goal: Will remain free from infection Outcome: Progressing   Problem: Elimination: Goal: Will not experience complications related to urinary retention Outcome: Progressing   Problem: Pain Managment: Goal: General experience of comfort will improve and/or be controlled Outcome: Progressing   Problem: Clinical Measurements: Goal: Ability to maintain clinical measurements within normal limits will improve Outcome: Not Progressing

## 2023-04-21 NOTE — Progress Notes (Signed)
   04/20/23 2035  Assess: MEWS Score  Temp 98.8 F (37.1 C)  BP 133/69  MAP (mmHg) 90  Pulse Rate (!) 118  Resp 15  SpO2 95 %  O2 Device Room Air  Assess: MEWS Score  MEWS Temp 0  MEWS Systolic 0  MEWS Pulse 2  MEWS RR 0  MEWS LOC 1  MEWS Score 3  MEWS Score Color Yellow  Assess: if the MEWS score is Yellow or Red  Were vital signs accurate and taken at a resting state? Yes  Does the patient meet 2 or more of the SIRS criteria? No  MEWS guidelines implemented  Yes, yellow  Treat  MEWS Interventions Considered administering scheduled or prn medications/treatments as ordered  Take Vital Signs  Increase Vital Sign Frequency  Yellow: Q2hr x1, continue Q4hrs until patient remains green for 12hrs  Escalate  MEWS: Escalate Yellow: Discuss with charge nurse and consider notifying provider and/or RRT  Notify: Charge Nurse/RN  Name of Charge Nurse/RN Notified Physiological scientist  Assess: SIRS CRITERIA  SIRS Temperature  0  SIRS Respirations  0  SIRS Pulse 1  SIRS WBC 0  SIRS Score Sum  1

## 2023-04-21 NOTE — Progress Notes (Signed)
 PROGRESS NOTE  Luke Foster  ZOX:[id number] DOB: 1950/11/05 DOA: 04/19/2023 PCP: Elfredia Nevins, MD   Brief Narrative: Patient is a 73 year old male with history of chronic back pain, chronic Foley catheter for urinary retention, nephrolithiasis, hypertension, depression, dementia who presented from home with complaint of worsening back pain.  History of fall about a week ago resulting in increased back pain, worsening confusion.  On presentation, lab work showed creatinine of 1.8, WC count of 10.9.  CT imaging of the head did not show any acute findings.  CT abdomen/pelvis with contrast showed moderate right hydronephrosis, hydroureter, 9 mm obstructing stone in the distal right ureter, additional 6 mm stone, possible pyelonephritis .  Started on IV antibiotic.  Urology consulted, s/p right ureteral stent on 3/11.  Hospital course remarkable for persistent right flank pain,spams  Assessment & Plan:  Principal Problem:   Pyelonephritis Active Problems:   Neuropathy   Chronic pain syndrome   Status post insertion of spinal cord stimulator   Hypertension   Degenerative disc disease, lumbar   Urinary retention   Obstructive uropathy   Anxiety   Dementia (HCC)   Right-sided nephrolithiasis: Presented with back pain.  CT abdomen/pelvis showed moderate right hydronephrosis and hydroureter, secondary to a 9 mm stone in the distal right ureter at the level of L5-S1.Also found to have an additional 6 mm stone within the ureter distal to the obstructing stone. There is perinephric fluid and stranding on the right in addition to mild urothelial enhancement raising possibility of superimposed infection. Imaging also showed features of cystitis, enlarged prostate.  Urology consulted.  S/P right-sided ureteral stent placement today.  Suspected pyelonephritis :started on ceftriaxone.  Currently afebrile.    Leukocytosis resolved.  Urine culture showing 20,000 colonies of resistant Enterobacter  cloacae.  Antibiotics changed to cefepime.  When his kidney function improves, we can put him on Bactrim on discharge.  Blood cultures have not shown any growth  Chronic urinary retention: On Foley.  On Flomax,vibegron  AKI/NAGMA:Baseline creatinine normal.  Continue gentle IV fluids with bicarb today  Chronic back pain: No acute findings on the CT lumbar spine.  Showed chronic compression deformities of T12 and L1. No retropulsion.Posterior instrumented fusion from L4-S1 without evidence of hardware complication. On lidocaine patch.  On oxycodone.  Dementia: CT head did not show any acute findings but showed generalized atrophy and findings of chronic microvascular disease.  Currently alert and oriented.  Continue deleirium precaution, frequent reorientation  Anxiety/restless leg syndrome: On Effexor, Requip  Hypertension: On diltiazem, enalapril.  ARB on hold due to AKI  Debility/Deconditioning: PT/OT consulted        DVT prophylaxis:SCDs Start: 04/19/23 2245     Code Status: Full Code  Family Communication: Brother at bedside on 3/11, on phone on 3/12  Patient status:Inpatient  Patient is from :home  Anticipated discharge WJ:XBJY  Estimated DC date:1-2 days   Consultants: Urology  Procedures: Plan for right-sided ureteral placement  Antimicrobials:  Anti-infectives (From admission, onward)    Start     Dose/Rate Route Frequency Ordered Stop   04/20/23 1800  cefTRIAXone (ROCEPHIN) 1 g in sodium chloride 0.9 % 100 mL IVPB  Status:  Discontinued        1 g 200 mL/hr over 30 Minutes Intravenous Every 24 hours 04/19/23 2244 04/21/23 1231   04/19/23 1815  cefTRIAXone (ROCEPHIN) 1 g in sodium chloride 0.9 % 100 mL IVPB        1 g 200 mL/hr over 30 Minutes Intravenous  Once 04/19/23 1806 04/19/23 2015       Subjective: Patient seen and examined at bedside today.  Hemodynamically stable afebrile.  He still has discomfort on the right side of lower abdomen.   Complains of spasms.  Objective: Vitals:   04/20/23 2035 04/21/23 0214 04/21/23 0700 04/21/23 1048  BP: 133/69 (!) 140/67 129/78 125/79  Pulse: (!) 118 (!) 112 (!) 116 (!) 107  Resp: 15 17 16 16   Temp: 98.8 F (37.1 C) 98.9 F (37.2 C) 98.3 F (36.8 C) 98.1 F (36.7 C)  TempSrc: Oral Oral Oral Oral  SpO2: 95% 95% 95% 95%  Weight:      Height:        Intake/Output Summary (Last 24 hours) at 04/21/2023 1231 Last data filed at 04/21/2023 1001 Gross per 24 hour  Intake 1706.54 ml  Output 1550 ml  Net 156.54 ml   Filed Weights   04/19/23 1457  Weight: 81.6 kg    Examination:   General exam: Mild to moderate distress due to pain HEENT: PERRL Respiratory system:  no wheezes or crackles  Cardiovascular system: S1 & S2 heard, RRR.  Gastrointestinal system: Abdomen is nondistended, soft .  Right lower abdomen tenderness Central nervous system: Alert and oriented Extremities: No edema, no clubbing ,no cyanosis Skin: No rashes, no ulcers,no icterus GU: Foley catheter   Data Reviewed: I have personally reviewed following labs and imaging studies  CBC: Recent Labs  Lab 04/19/23 1501 04/20/23 0455 04/21/23 0525  WBC 10.9* 9.5 10.5  NEUTROABS 9.7*  --   --   HGB 11.5* 10.7* 9.3*  HCT 35.9* 34.5* 28.9*  MCV 97.3 99.4 96.3  PLT 288 320 309   Basic Metabolic Panel: Recent Labs  Lab 04/19/23 1501 04/20/23 0450 04/21/23 0525  NA 135 134* 133*  K 4.1 4.4 3.9  CL 105 105 105  CO2 16* 19* 16*  GLUCOSE 127* 100* 135*  BUN 39* 36* 40*  CREATININE 1.88* 1.75* 1.41*  CALCIUM 10.0 9.3 9.0     Recent Results (from the past 240 hours)  Urine Culture     Status: Abnormal   Collection Time: 04/19/23  4:23 PM   Specimen: Urine, Random  Result Value Ref Range Status   Specimen Description   Final    URINE, RANDOM Performed at [facility], [address], [place], [place] [address]    Special Requests   Final    NONE Reflexed from [id number] Performed at [facility], [address] [place], [place] [address]    Culture 20,000 COLONIES/mL ENTEROBACTER CLOACAE (A)  Final   Report Status 04/21/2023 FINAL  Final   Organism ID, Bacteria ENTEROBACTER CLOACAE (A)  Final      Susceptibility   Enterobacter cloacae - MIC*    CEFEPIME 1 SENSITIVE Sensitive     CIPROFLOXACIN 1 RESISTANT Resistant     GENTAMICIN <=1 SENSITIVE Sensitive     IMIPENEM 1 SENSITIVE Sensitive     NITROFURANTOIN 128 RESISTANT Resistant     TRIMETH/SULFA <=20 SENSITIVE Sensitive     PIP/TAZO 64 INTERMEDIATE Intermediate ug/mL    * 20,000 COLONIES/mL ENTEROBACTER CLOACAE  Culture, blood (Routine X 2) w Reflex to ID Panel     Status: None (Preliminary result)   Collection Time: 04/20/23  8:23 AM   Specimen: BLOOD RIGHT HAND  Result Value Ref Range Status   Specimen Description   Final    BLOOD RIGHT HAND Performed at [facility], [address],  [place], [place] [address]    Special Requests   Final    BOTTLES DRAWN AEROBIC ONLY Blood Culture results may not be optimal due to an inadequate volume of blood received in culture bottles Performed at [facility], [address] [place], [place] [address]    Culture   Final    NO GROWTH < 24 HOURS Performed at [facility], [address] [place], [place] [address]    Report Status PENDING  Incomplete  Culture, blood (Routine X 2) w Reflex to ID Panel     Status: None (Preliminary result)   Collection Time: 04/20/23  8:23 AM   Specimen: BLOOD RIGHT ARM  Result Value Ref Range Status   Specimen Description   Final    BLOOD RIGHT ARM Performed at [facility], [address], [place], [place] [address]    Special Requests   Final    BOTTLES DRAWN AEROBIC ONLY Blood Culture results may not be optimal due to an inadequate volume of blood received in culture bottles Performed at [facility], [address], [place], [place] [address]    Culture   Final    NO GROWTH < 24  HOURS Performed at [facility], [address] [place], [place] [address]    Report Status PENDING  Incomplete     Radiology Studies: DG C-Arm 1-60 Min-No Report Result Date: 04/20/2023 Fluoroscopy was utilized by the requesting physician.  No radiographic interpretation.   CT ABDOMEN PELVIS W CONTRAST Result Date: 04/19/2023 CLINICAL DATA:  Fall with low back pain green urine in bag EXAM: CT ABDOMEN AND PELVIS WITH CONTRAST TECHNIQUE: Multidetector CT imaging of the abdomen and pelvis was performed using the standard protocol following bolus administration of intravenous contrast. RADIATION DOSE REDUCTION: This exam was performed according to the departmental dose-optimization program which includes automated exposure control, adjustment of the mA and/or kV according to patient size and/or use of iterative reconstruction technique. CONTRAST:  80mL OMNIPAQUE IOHEXOL 300 MG/ML  SOLN COMPARISON:  CT 11/03/2022 FINDINGS: Lower chest: Lung bases demonstrate atelectasis and or scarring at the lung bases. No acute airspace disease Hepatobiliary: Distended gallbladder. No calcified stone or biliary dilatation Pancreas: Atrophic.  No inflammation Spleen: Normal in size without focal abnormality. Adrenals/Urinary Tract: Adrenal glands are normal. Bilateral renal cysts, on the right measures up to 14 cm, no specific imaging follow-up is recommended. Bilateral kidney stones, on the left measuring up to 3 mm and on the right measuring up to 4 mm. New moderate right hydronephrosis and hydroureter, secondary to a 9 mm stone in the distal right ureter at the level of L5-S1. There is an additional stone within the ureter distal to the obstructing stone, this measures 6 mm and is visual about 2 cm proximal to the left UVJ. There is perinephric fluid and stranding on the right in addition to mild urothelial enhancement. Delayed excretion of contrast from right kidney consistent with obstruction. Bladder is  decompressed by Foley catheter. Diffuse bladder wall thickening with perivesical stranding. Small calcifications within the bladder probably due to stones Stomach/Bowel: Stomach nonenlarged. No dilated small bowel. No acute bowel wall thickening. Vascular/Lymphatic: Aortic atherosclerosis. No enlarged abdominal or pelvic lymph nodes. Reproductive: Enlarged prostate Other: Negative for pelvic effusion or free air Musculoskeletal: See separately dictated lumbar spine CT report. No acute osseous findings could in the pelvis. IMPRESSION: 1. Moderate right hydronephrosis and hydroureter, secondary to a 9 mm stone in the distal  right ureter at the level of L5-S1. There is an additional 6 mm stone within the ureter distal to the obstructing stone. There is perinephric fluid and stranding on the right in addition to mild urothelial enhancement raising possibility of superimposed infection. 2. Bilateral kidney stones. Bladder is decompressed by Foley catheter. Diffuse bladder wall thickening with perivesical stranding, question cystitis. Small calcifications within the bladder probably due to stones. 3. Enlarged prostate. 4. Aortic atherosclerosis. Electronically Signed   By: Jasmine Pang M.D.   On: 04/19/2023 20:17   CT L-SPINE NO CHARGE Result Date: 04/19/2023 CLINICAL DATA:  Back pain, fall 2 days ago. EXAM: CT LUMBAR SPINE WITHOUT CONTRAST TECHNIQUE: Multidetector CT imaging of the lumbar spine was performed without intravenous contrast administration. Multiplanar CT image reconstructions were also generated. RADIATION DOSE REDUCTION: This exam was performed according to the departmental dose-optimization program which includes automated exposure control, adjustment of the mA and/or kV according to patient size and/or use of iterative reconstruction technique. COMPARISON:  MRI lumbar spine 03/22/2019, CT lumbar spine 10/24/2018 FINDINGS: Segmentation: 5 lumbar type vertebrae. Alignment: Lumbar lordosis is maintained.   No listhesis. Vertebrae: Diffuse osteopenia. Similar irregularity of the T12 superior endplate with moderate height loss centrally. Additional irregularities of the L1 superior and inferior endplates with mild height loss anteriorly is similar to prior. No additional compression deformities or evidence of displaced fracture on the current study. Posterior instrumented fusion from L4-S1 with bilateral pedicle screws and vertical interlocking rods. Hardware is intact. Mature osseous fusion noted from L4-S1. Paraspinal and other soft tissues: The paraspinal musculature is unremarkable. Large partially visualized cystic lesion involving the right kidney better evaluated on same day CT abdomen pelvis. Additional partially visualized left renal cyst. Multiple calculi noted in the right kidney with prominence of the right renal collecting system. Atherosclerosis of the abdominal aorta and branch vessels. Degenerative changes of the bilateral sacroiliac joints. Disc levels: Intervertebral disc spaces are maintained. There is a disc bulge at L1-2 along the posterior osteophytes more pronounced along the left without significant osseous spinal canal stenosis. Additional disc bulge and facet arthrosis at L2-3 resulting in mild spinal canal stenosis. Disc bulge and facet arthrosis at L3-4 resulting in mild spinal canal stenosis. Disc bulge and facet osteophytes contributing to mild right and mild-to-moderate left foraminal stenosis at L3-4. No definite spinal canal narrowing at L4-5 or L5-S1. IMPRESSION: 1. No acute fracture or traumatic malalignment of the lumbar spine. 2. Similar chronic compression deformities of T12 and L1. No retropulsion. 3. Posterior instrumented fusion from L4-S1 without evidence of hardware complication. 4. Multilevel degenerative changes of the lumbar spine as described. Electronically Signed   By: Emily Filbert M.D.   On: 04/19/2023 20:11   CT Head Wo Contrast Result Date: 04/19/2023 CLINICAL  DATA:  Altered mental status EXAM: CT HEAD WITHOUT CONTRAST TECHNIQUE: Contiguous axial images were obtained from the base of the skull through the vertex without intravenous contrast. RADIATION DOSE REDUCTION: This exam was performed according to the departmental dose-optimization program which includes automated exposure control, adjustment of the mA and/or kV according to patient size and/or use of iterative reconstruction technique. COMPARISON:  04/29/2020 FINDINGS: Brain: There is no mass, hemorrhage or extra-axial collection. There is generalized atrophy without lobar predilection. Hypodensity of the white matter is most commonly associated with chronic microvascular disease. Vascular: No hyperdense vessel or unexpected vascular calcification. Skull: The visualized skull base, calvarium and extracranial soft tissues are normal. Sinuses/Orbits: Partial sphenoid sinus opacification. Normal orbits. Other: None. IMPRESSION:  1. No acute intracranial abnormality. 2. Generalized atrophy and findings of chronic microvascular disease. Electronically Signed   By: Deatra Robinson M.D.   On: 04/19/2023 20:03   DG Chest 2 View Result Date: 04/19/2023 CLINICAL DATA:  Sepsis.  Back pain. EXAM: CHEST - 2 VIEW COMPARISON:  11/03/2022 FINDINGS: Two views of the chest demonstrate low lung volumes. Linear densities in left lower lung are suggestive for atelectasis. Linear densities in the right lung may also represent atelectasis. Heart size is normal. No large pleural effusions. Negative for a pneumothorax. IMPRESSION: Low lung volumes with bilateral atelectasis. Electronically Signed   By: Richarda Overlie M.D.   On: 04/19/2023 18:05    Scheduled Meds:  Chlorhexidine Gluconate Cloth  6 each Topical Daily   diltiazem  120 mg Oral Daily   docusate sodium  300 mg Oral QHS   mirabegron ER  50 mg Oral QHS   polyethylene glycol  17 g Oral Daily   senna-docusate  1 tablet Oral BID   tamsulosin  0.4 mg Oral QPM   venlafaxine XR   75 mg Oral Q breakfast   Continuous Infusions:  sodium bicarbonate 150 mEq in dextrose 5 % 1,150 mL infusion       LOS: 2 days   Burnadette Pop, MD [facility] P3/01/2024, 12:31 PM

## 2023-04-21 NOTE — Plan of Care (Signed)
   Problem: Health Behavior/Discharge Planning: Goal: Ability to manage health-related needs will improve Outcome: Progressing   Problem: Clinical Measurements: Goal: Ability to maintain clinical measurements within normal limits will improve Outcome: Progressing Goal: Will remain free from infection Outcome: Progressing

## 2023-04-21 NOTE — Progress Notes (Signed)
 Pharmacy Antibiotic Note  Luke Foster is a 73 y.o. male admitted on 04/19/2023 with UTI.  Pharmacy has been consulted for Cefepime dosing.  Active Problem(s): worsening back pain. History of fall about a week ago resulting in increased back pain, worsening confusion.   ID: UTI/Pyelo. Tmax 10, WBC 10.5, Scr 1.41 (also with h/o PSA)  Rocephin 3/10>3/11 Cefepime 3/12>>  3/10: UCx 20,000 Enterobacter only S to Cefepime, Gent, Imipenem, Sulfa 3/11: BC x 2>ID: UTI/Pyelo.  Plan: Cefepime 2g IV q12 hrs   Height: 5\' 8"  (172.7 cm) Weight: 81.6 kg (180 lb) IBW/kg (Calculated) : 68.4  Temp (24hrs), Avg:98.3 F (36.8 C), Min:97.4 F (36.3 C), Max:98.9 F (37.2 C)  Recent Labs  Lab 04/19/23 1501 04/20/23 0450 04/20/23 0455 04/21/23 0525  WBC 10.9*  --  9.5 10.5  CREATININE 1.88* 1.75*  --  1.41*  LATICACIDVEN 1.7  1.9  --   --   --     Estimated Creatinine Clearance: 46.5 mL/min (A) (by C-G formula based on SCr of 1.41 mg/dL (H)).    Allergies  Allergen Reactions   Erythromycin Itching    Luke Foster S. Merilynn Finland, PharmD, BCPS Clinical Staff Pharmacist  Misty Stanley Stillinger 04/21/2023 12:42 PM

## 2023-04-22 ENCOUNTER — Inpatient Hospital Stay (HOSPITAL_COMMUNITY)

## 2023-04-22 ENCOUNTER — Encounter (HOSPITAL_COMMUNITY): Payer: Self-pay | Admitting: Family Medicine

## 2023-04-22 DIAGNOSIS — N12 Tubulo-interstitial nephritis, not specified as acute or chronic: Secondary | ICD-10-CM | POA: Diagnosis not present

## 2023-04-22 LAB — BASIC METABOLIC PANEL
Anion gap: 12 (ref 5–15)
BUN: 35 mg/dL — ABNORMAL HIGH (ref 8–23)
CO2: 21 mmol/L — ABNORMAL LOW (ref 22–32)
Calcium: 9.1 mg/dL (ref 8.9–10.3)
Chloride: 104 mmol/L (ref 98–111)
Creatinine, Ser: 1.21 mg/dL (ref 0.61–1.24)
GFR, Estimated: >60 mL/min (ref >60)
Glucose, Bld: 127 mg/dL — ABNORMAL HIGH (ref 70–99)
Potassium: 3.7 mmol/L (ref 3.5–5.1)
Sodium: 137 mmol/L (ref 135–145)

## 2023-04-22 MED ORDER — SODIUM CHLORIDE 0.9 % IV SOLN: INTRAVENOUS | Status: DC

## 2023-04-22 MED ORDER — HYOSCYAMINE SULFATE 0.125 MG PO TBDP: 0.2500 mg | ORAL_TABLET | ORAL | Status: DC | PRN

## 2023-04-22 MED ORDER — POLYETHYLENE GLYCOL 3350 17 G PO PACK
17.0000 g | PACK | Freq: Two times a day (BID) | ORAL | Status: DC
  Administered 2023-04-28 – 2023-04-29 (×2): 17 g via ORAL
  Filled 2023-04-22 (×7): qty 1

## 2023-04-22 MED ORDER — BISACODYL 10 MG RE SUPP
10.0000 mg | Freq: Once | RECTAL | Status: AC
  Administered 2023-04-22: 10 mg via RECTAL
  Filled 2023-04-22: qty 1

## 2023-04-22 MED ORDER — HYOSCYAMINE SULFATE 0.125 MG SL SUBL: 0.1250 mg | SUBLINGUAL_TABLET | SUBLINGUAL | Status: DC | PRN

## 2023-04-22 MED ORDER — IOHEXOL 300 MG/ML  SOLN: 30.0000 mL | Freq: Once | INTRAMUSCULAR | Status: DC | PRN

## 2023-04-22 NOTE — TOC Initial Note (Addendum)
 Transition of Care [facility]) - Initial/Assessment Note    Patient Details  Name: Luke Foster MRN: [id number] Date of Birth: [birth year]  Transition of Care [facility]) CM/SW Contact:    Howell Rucks, RN Phone Number: 04/22/2023, 10:57 AM  Clinical NarrativeMet with pt and his brother , Vi Whitesel, at bedside to introduce role of TOC/NCM and review for dc planning, pt able to answer some assessment questions, pt's brother answered most of the questions, confirmed pt has an established PCP and pharmacy in place, pt resides alone , brother states he and his spouse are pt's caregivers, pt currently receiving [facility] RN services through [facility] for chronic foley care, reports pt is able to ambulate with walker intermittently, has a w/c available. PT eval pending, await recommendation. TOC will continue to follow.        -3:23pm PT eval completed, recommendation for short term rehab/SNF.   Sw pt's brother, Orvilla Fus, reports he wants o think about, may purse continued HH RN with [facility] and add PT/OT to services. TOC will continue to follow.          Expected Discharge Plan: Home w Home Health Services Barriers to Discharge: Continued Medical Work up   Patient Goals and CMS Choice Patient states their goals for this hospitalization and ongoing recovery are:: return home with [facility] services through [facility]          Expected Discharge Plan and Services       Living arrangements for the past 2 months: Single Family Home                                      Prior Living Arrangements/Services Living arrangements for the past 2 months: Single Family Home Lives with:: Self Patient language and need for interpreter reviewed:: Yes Do you feel safe going back to the place where you live?: Yes      Need for Family Participation in Patient Care: Yes (Comment) Care giver support system in place?: Yes (comment) Current home services: DME, Home RN ([facility]; Home Health RN through [facility] for foley  care) Criminal Activity/Legal Involvement Pertinent to Current Situation/Hospitalization: No - Comment as needed  Activities of Daily Living   ADL Screening (condition at time of admission) Independently performs ADLs?: No Does the patient have a NEW difficulty with bathing/dressing/toileting/self-feeding that is expected to last >3 days?: No Does the patient have a NEW difficulty with getting in/out of bed, walking, or climbing stairs that is expected to last >3 days?: No Does the patient have a NEW difficulty with communication that is expected to last >3 days?: No Is the patient deaf or have difficulty hearing?: Yes Does the patient have difficulty seeing, even when wearing glasses/contacts?: No Does the patient have difficulty concentrating, remembering, or making decisions?: Yes  Permission Sought/Granted                  Emotional Assessment Appearance:: Appears stated age Attitude/Demeanor/Rapport: Gracious Affect (typically observed): Accepting Orientation: : Oriented to Self, Oriented to Place, Oriented to  Time, Oriented to Situation Alcohol / Substance Use: Not Applicable Psych Involvement: No (comment)  Admission diagnosis:  Pyelonephritis [N12] Acute kidney injury [facility]) [N17.9] Right ureteral stone [N20.1] Patient Active Problem List   Diagnosis Date Noted   Pyelonephritis 04/19/2023   Dementia (HCC) 04/19/2023   Complicated UTI (urinary tract infection) 06/17/2022   Small bowel obstruction (HCC) 01/12/2022  Leukocytosis 01/12/2022   Anxiety 01/12/2022   Depression 01/12/2022   Restless leg syndrome 01/12/2022   Intractable nausea and vomiting 11/30/2021   Ileus (HCC) 11/30/2021   Obstructive uropathy 11/29/2021   AKI (acute kidney injury) (HCC) 11/29/2021   Generalized weakness 04/30/2020   Constipation 04/30/2020   Nephrolithiasis 04/30/2020   Pressure injury of skin 04/30/2020   Urinary retention 04/29/2020   Pain in limb 05/09/2019   Acute  metabolic encephalopathy 03/08/2019   Bilateral hydronephrosis 03/07/2019   Altered mental status 03/07/2019   Hypertension    Abnormal ECG 02/28/2019   Atherosclerosis of abdominal aorta (HCC) 02/28/2019   Benign essential HTN 02/28/2019   Hyperlipidemia, mixed 02/28/2019   Neuropathy 01/21/2017   Pain in right foot 01/21/2017   Pain in left foot 01/21/2017   Chronic pain syndrome 01/21/2017   Post laminectomy syndrome 01/21/2017   Status post insertion of spinal cord stimulator 01/21/2017   Uncomplicated opioid dependence (HCC) 01/21/2017   Chronic bilateral low back pain without sciatica 11/27/2015   Degenerative disc disease, lumbar 06/17/2015   Encounter for therapeutic drug monitoring 06/17/2015   Nervous system device, implant, or graft complication, initial encounter 04/06/2014   Lumbosacral radiculopathy 12/09/2012   PCP:  Elfredia Nevins, MD Pharmacy:   [facility] [phone] [place], [place] - [address] [place] [address] Phone: [phone] Fax: [phone]     Social Drivers of Health (SDOH) Social History: SDOH Screenings   Food Insecurity: No Food Insecurity (04/20/2023)  Housing: Low Risk  (04/20/2023)  Transportation Needs: No Transportation Needs (04/20/2023)  Utilities: Not At Risk (04/20/2023)  Social Connections: Moderately Isolated (04/20/2023)  Tobacco Use: Medium Risk (04/20/2023)   SDOH Interventions:     Readmission Risk Interventions    04/22/2023   10:55 AM 06/19/2022    1:54 PM 01/16/2022    1:14 PM  Readmission Risk Prevention Plan  Transportation Screening Complete Complete Complete  PCP or Specialist Appt within 3-5 Days Complete    HRI or Home Care Consult Complete Complete Complete  Social Work Consult for Recovery Care Planning/Counseling Complete Complete Complete  Palliative Care Screening Not Applicable Not Applicable Not Applicable  Medication Review [facility])  Complete Complete Complete

## 2023-04-22 NOTE — Progress Notes (Signed)
 2 Days Post-Op Subjective: Flank pain overnight. Met with pt for the first time, who was accompanied by his brother/caretaker. Reviewed case and plan  Objective: Vital signs in last 24 hours: Temp:  [98.1 F (36.7 C)-99 F (37.2 C)] 99 F (37.2 C) (03/13 1319) Pulse Rate:  [96-113] 111 (03/13 1319) Resp:  [16-18] 16 (03/13 1319) BP: (114-141)/(64-84) 138/79 (03/13 1319) SpO2:  [92 %-96 %] 95 % (03/13 1319)  Assessment/Plan: #right renal stone #hemorrhage into right renal cyst  S/p cystoscopy with right ureteral stent placement-Dr. Heide Scales, 04/20/2023 Interval development of right side flank pain overnight resulting in repeat CT A/P.  Imaging revealed interval development of a moderate hemorrhage within this large right renal cyst.  On independent review the overall size has not changed significantly and hemoglobin is well within range.  Unclear what the cause of this new bleed may be.  At there been any compromise during wire placement, it would have been reflected in the retrograde pyelogram.  Regardless.  Decision made to proceed with watchful waiting.  Recheck hemoglobin in the morning.  Plan for definitive stone management next Friday with Dr. Alvester Morin.  Due to difficulty transporting patient, will hold off on any outpatient preop labs and collect them and the preop area if necessary.  Please call with questions  Reviewed case and plan with Dr. Alvester Morin and Dr. Lafonda Mosses  Intake/Output from previous day: 03/12 0701 - 03/13 0700 In: 1888.3 [P.O.:600; I.V.:1088.3; IV Piggyback:200] Out: 1350 [Urine:1350]  Intake/Output this shift: Total I/O In: 1571.5 [P.O.:480; I.V.:991.5; IV Piggyback:100] Out: 1100 [Urine:1100]  Physical Exam:  General: Alert and oriented CV: No cyanosis Lungs: equal chest rise Gu: Foley catheter in place draining clear yellow urine.  Lab Results: Recent Labs    04/20/23 0455 04/21/23 0525  HGB 10.7* 9.3*  HCT 34.5* 28.9*   BMET Recent Labs     04/21/23 0525 04/22/23 0541  NA 133* 137  K 3.9 3.7  CL 105 104  CO2 16* 21*  GLUCOSE 135* 127*  BUN 40* 35*  CREATININE 1.41* 1.21  CALCIUM 9.0 9.1     Studies/Results: CT ABDOMEN PELVIS WO CONTRAST Result Date: 04/22/2023 CLINICAL DATA:  Abdominal pain. EXAM: CT ABDOMEN AND PELVIS WITHOUT CONTRAST TECHNIQUE: Multidetector CT imaging of the abdomen and pelvis was performed following the standard protocol without IV contrast. RADIATION DOSE REDUCTION: This exam was performed according to the departmental dose-optimization program which includes automated exposure control, adjustment of the mA and/or kV according to patient size and/or use of iterative reconstruction technique. COMPARISON:  CT abdomen pelvis dated 04/19/2023. FINDINGS: Evaluation of this exam is limited due to respiratory motion as well as in the absence of intravenous contrast. Lower chest: Partially visualized small right pleural effusion and partial compressive atelectasis of the right lower lobe. Pneumonia is not excluded. Minimal left lung base linear atelectasis. There is coronary vascular calcification. No intra-abdominal free air or free fluid. Hepatobiliary: The liver is unremarkable. No biliary dilatation. Layering sludge noted in the gallbladder. No pericholecystic fluid. No calcified gallstone. Pancreas: Unremarkable. No pancreatic ductal dilatation or surrounding inflammatory changes. Spleen: Normal in size without focal abnormality. Adrenals/Urinary Tract: The right adrenal glands unremarkable. Indeterminate 1 cm left adrenal nodule. Small left renal interpolar cyst. There is a 5 mm nonobstructing left renal inferior pole calculus. There has been interval placement of a right pigtail ureteral stent with proximal tip in the right renal collecting system and distal end in the urinary bladder. Near complete resolution of the previously  seen right hydronephrosis. Multiple nonobstructing right renal inferior pole calculi with  combined length of approximately 1 cm. There has been interval development of moderate amount of high attenuating content within the large right renal cyst consistent with hemorrhage. Small pockets of air also noted within this collection there is right perinephric stranding and possible small hematoma. Relatively similar location of a 9 mm distal right ureteral calculus or the pelvic brim. The additional nonobstructing smaller stone in the distal right ureter is no longer visualized and likely past into the bladder. The urinary bladder is minimally distended. Similar appearance of a cluster of small stones along the posterior bladder wall. A Foley catheter noted in the bladder. Tiny pocket of air within the urinary bladder, likely introduced via catheter. Stomach/Bowel: Postsurgical changes of bowel with anastomotic staple line in the lower abdomen. Mildly dilated small bowel loops measure up to 3.3 cm in caliber. The terminal ileum and distal small bowel in the right lower quadrant are collapsed. Although a transition appears to be present in the right lower quadrant, findings may represent an ileus or developing obstruction. Follow-up with small-bowel series recommended to document passage of contrast into the colon. Vascular/Lymphatic: Moderate aortoiliac atherosclerotic disease. The IVC is unremarkable. No portal venous gas. There is no adenopathy. Reproductive: Enlarged prostate gland with median lobe hypertrophy indenting with bladder. Other: Subcutaneous edema of the right lateral abdominal wall. Musculoskeletal: Osteopenia with degenerative changes of the spine. Lower lumbar posterior fusion. No acute osseous pathology. IMPRESSION: 1. Interval placement of a right pigtail ureteral stent with near complete resolution of the previously seen right hydronephrosis. 2. Interval development of moderate amount of hemorrhage within the large right renal cyst. 3. Relatively similar location of a 9 mm distal right  ureteral calculus or the pelvic brim. 4. Ileus versus developing small-bowel obstruction. Follow-up with small-bowel series recommended. Electronically Signed   By: Elgie Collard M.D.   On: 04/22/2023 14:14      LOS: 3 days   Elmon Kirschner, NP [facility] Pager: [phone]  04/22/2023, 3:35 PM

## 2023-04-22 NOTE — Progress Notes (Addendum)
 PROGRESS NOTE  Luke Foster  HYQ:[id number] DOB: 07/16/1950 DOA: 04/19/2023 PCP: Elfredia Nevins, MD   Brief Narrative: Patient is a 73 year old male with history of chronic back pain, chronic Foley catheter for urinary retention, nephrolithiasis, hypertension, depression, dementia who presented from home with complaint of worsening back pain.  History of fall about a week ago resulting in increased back pain, worsening confusion.  On presentation, lab work showed creatinine of 1.8, WC count of 10.9.  CT imaging of the head did not show any acute findings.  CT abdomen/pelvis with contrast showed moderate right hydronephrosis, hydroureter, 9 mm obstructing stone in the distal right ureter, additional 6 mm stone, possible pyelonephritis .  Started on IV antibiotic.  Urology consulted, s/p right ureteral stent on 3/11.  Hospital course remarkable for persistent right flank pain,spams,abdominal distention.  Repeat CT abdomen/pelvis without contrast pending.  Urology following  Assessment & Plan:  Principal Problem:   Pyelonephritis Active Problems:   Neuropathy   Chronic pain syndrome   Status post insertion of spinal cord stimulator   Hypertension   Degenerative disc disease, lumbar   Urinary retention   Obstructive uropathy   Anxiety   Dementia (HCC)   Right-sided nephrolithiasis: Presented with right flank pain.  CT abdomen/pelvis showed moderate right hydronephrosis and hydroureter, secondary to a 9mm stone in the distal right ureter at the level of L5-S1.Also found to have an additional 6 mm stone within the ureter distal to the obstructing stone. There is perinephric fluid and stranding on the right in addition to mild urothelial enhancement raising possibility of superimposed infection. Imaging also showed features of cystitis, enlarged prostate.  Urology consulted.  S/P right-sided ureteral stent placement on 3/11. Patient continued to have right flank pain, spasms.  Discussed with  urology.  Ordering CT abdomen/pelvis without contrast today.  Abdomen appears distended.  Suspected pyelonephritis  Currently afebrile.    Leukocytosis resolved.  Urine culture showing 20,000 colonies of resistant Enterobacter cloacae.  Antibiotics changed to cefepime.  When his kidney function improves, we can put him on Bactrim on discharge.  Blood cultures have not shown any growth  Constipation: Continue bowel regimen.  Ordered Dulcolax suppository  Chronic urinary retention: On Foley chronically .  On Flomax,vibegron  AKI/NAGMA:Resolved  Chronic back pain: No acute findings on the CT lumbar spine.  Showed chronic compression deformities of T12 and L1. No retropulsion.Posterior instrumented fusion from L4-S1 without evidence of hardware complication. On lidocaine patch.  On oxycodone.  Dementia: CT head did not show any acute findings but showed generalized atrophy and findings of chronic microvascular disease.  Currently alert and oriented.  Continue deleirium precaution, frequent reorientation  Anxiety/restless leg syndrome: On Effexor, Requip  Hypertension: On diltiazem, enalapril.  ARB on hold due to AKI  Debility/Deconditioning: PT/OT consulted  Addendum:  CT abdomen/pelvis shows moderate amount of hemorrhage in the right renal cyst and also possible SBO versus ileus. This cud have caused the pain. General Surgery consulted. Will keep him n.p.o. continue IV fluid, pain management.  I have also requested urology follow-up         DVT prophylaxis:SCDs Start: 04/19/23 2245     Code Status: Full Code  Family Communication: Brother at bedside on 3/13  Patient status:Inpatient  Patient is from :home  Anticipated discharge XB:MWUX  Estimated DC date:2-3 days   Consultants: Urology  Procedures:  right-sided ureteral placement  Antimicrobials:  Anti-infectives (From admission, onward)    Start     Dose/Rate Route Frequency Ordered Stop  04/21/23 1330   ceFEPIme (MAXIPIME) 2 g in sodium chloride 0.9 % 100 mL IVPB        2 g 200 mL/hr over 30 Minutes Intravenous Every 12 hours 04/21/23 1242     04/20/23 1800  cefTRIAXone (ROCEPHIN) 1 g in sodium chloride 0.9 % 100 mL IVPB  Status:  Discontinued        1 g 200 mL/hr over 30 Minutes Intravenous Every 24 hours 04/19/23 2244 04/21/23 1231   04/19/23 1815  cefTRIAXone (ROCEPHIN) 1 g in sodium chloride 0.9 % 100 mL IVPB        1 g 200 mL/hr over 30 Minutes Intravenous  Once 04/19/23 1806 04/19/23 2015       Subjective: Patient seen and examined at bedside today.  He still having right flank discomfort, abdominal spasms.  No bowel movement since last Sunday.  Passing gas.  Abdomen  was distended.  No nausea or vomiting.  Slow bowel sounds noted.  Case discussed with urology, getting CT abdomen/pelvis for follow-up  Objective: Vitals:   04/21/23 1859 04/21/23 2229 04/22/23 0206 04/22/23 0641  BP: (!) 141/84 128/83 114/64 115/70  Pulse: (!) 113 (!) 109 (!) 106 96  Resp: 16 17 18 18   Temp: 98.5 F (36.9 C) 98.2 F (36.8 C) 98.4 F (36.9 C) 98.1 F (36.7 C)  TempSrc: Oral Oral Oral Oral  SpO2: 95% 92% 92% 96%  Weight:      Height:        Intake/Output Summary (Last 24 hours) at 04/22/2023 1256 Last data filed at 04/22/2023 1610 Gross per 24 hour  Intake 1805 ml  Output 1500 ml  Net 305 ml   Filed Weights   04/19/23 1457  Weight: 81.6 kg    Examination:   General exam: In mild to moderate distress due to pain HEENT: PERRL Respiratory system:  no wheezes or crackles  Cardiovascular system: S1 & S2 heard, RRR.  Gastrointestinal system: Abdomen is distended, tender on the right upper quadrant/right flank, slow bowel sounds heard Central nervous system: Alert and oriented Extremities: No edema, no clubbing ,no cyanosis Skin: No rashes, no ulcers,no icterus   GU: Foley catheter   Data Reviewed: I have personally reviewed following labs and imaging studies  CBC: Recent Labs   Lab 04/19/23 1501 04/20/23 0455 04/21/23 0525  WBC 10.9* 9.5 10.5  NEUTROABS 9.7*  --   --   HGB 11.5* 10.7* 9.3*  HCT 35.9* 34.5* 28.9*  MCV 97.3 99.4 96.3  PLT 288 320 309   Basic Metabolic Panel: Recent Labs  Lab 04/19/23 1501 04/20/23 0450 04/21/23 0525 04/22/23 0541  NA 135 134* 133* 137  K 4.1 4.4 3.9 3.7  CL 105 105 105 104  CO2 16* 19* 16* 21*  GLUCOSE 127* 100* 135* 127*  BUN 39* 36* 40* 35*  CREATININE 1.88* 1.75* 1.41* 1.21  CALCIUM 10.0 9.3 9.0 9.1     Recent Results (from the past 240 hours)  Urine Culture     Status: Abnormal   Collection Time: 04/19/23  4:23 PM   Specimen: Urine, Random  Result Value Ref Range Status   Specimen Description   Final    URINE, RANDOM Performed at [facility], [address] [place], [place] [address]    Special Requests   Final    NONE Reflexed from [id number] Performed at [facility], [address], [place], [place] [address]    Culture 20,000 COLONIES/mL ENTEROBACTER CLOACAE (A)  Final   Report  Status 04/21/2023 FINAL  Final   Organism ID, Bacteria ENTEROBACTER CLOACAE (A)  Final      Susceptibility   Enterobacter cloacae - MIC*    CEFEPIME 1 SENSITIVE Sensitive     CIPROFLOXACIN 1 RESISTANT Resistant     GENTAMICIN <=1 SENSITIVE Sensitive     IMIPENEM 1 SENSITIVE Sensitive     NITROFURANTOIN 128 RESISTANT Resistant     TRIMETH/SULFA <=20 SENSITIVE Sensitive     PIP/TAZO 64 INTERMEDIATE Intermediate ug/mL    * 20,000 COLONIES/mL ENTEROBACTER CLOACAE  Culture, blood (Routine X 2) w Reflex to ID Panel     Status: None (Preliminary result)   Collection Time: 04/20/23  8:23 AM   Specimen: BLOOD RIGHT HAND  Result Value Ref Range Status   Specimen Description   Final    BLOOD RIGHT HAND Performed at [facility], [address], [place], [place] [address]    Special Requests   Final    BOTTLES DRAWN AEROBIC ONLY Blood Culture results may not be optimal due to an inadequate volume of blood received in  culture bottles Performed at [facility], [address], [place], [place] [address]    Culture   Final    NO GROWTH 2 DAYS Performed at [facility], [address], [place], [place] [address]    Report Status PENDING  Incomplete  Culture, blood (Routine X 2) w Reflex to ID Panel     Status: None (Preliminary result)   Collection Time: 04/20/23  8:23 AM   Specimen: BLOOD RIGHT ARM  Result Value Ref Range Status   Specimen Description   Final    BLOOD RIGHT ARM Performed at [facility], [address], [place], [place] [address]    Special Requests   Final    BOTTLES DRAWN AEROBIC ONLY Blood Culture results may not be optimal due to an inadequate volume of blood received in culture bottles Performed at [facility] [address] [place], [place] [address]    Culture   Final    NO GROWTH 2 DAYS Performed at [facility], [address] [place], [place] [address]    Report Status PENDING  Incomplete     Radiology Studies: DG C-Arm 1-60 Min-No Report Result Date: 04/20/2023 Fluoroscopy was utilized by the requesting physician.  No radiographic interpretation.    Scheduled Meds:  bisacodyl  10 mg Rectal Once   Chlorhexidine Gluconate Cloth  6 each Topical Daily   diltiazem  180 mg Oral Daily   docusate sodium  300 mg Oral QHS   mirabegron ER  50 mg Oral QHS   polyethylene glycol  17 g Oral Daily   senna-docusate  1 tablet Oral BID   tamsulosin  0.4 mg Oral QPM   venlafaxine XR  75 mg Oral Q breakfast   Continuous Infusions:  ceFEPime (MAXIPIME) IV 2 g (04/22/23 1219)   sodium bicarbonate 150 mEq in dextrose 5 % 1,150 mL infusion 75 mL/hr at 04/22/23 0443     LOS: 3 days   Burnadette Pop, MD [facility] P3/13/2025, 12:56 PM

## 2023-04-22 NOTE — Evaluation (Addendum)
 Occupational Therapy Evaluation Patient Details Name: Luke Foster MRN: [id number] DOB: 1951-02-06 Today's Date: 04/22/2023   History of Present Illness   73 year old male who presented from home with c/o worsening back pain; had fall ~ 1 wk prior to adm. CT head negative.  CT abdomen/pelvi showed moderate right hydronephrosis, hydroureter, 9 mm obstructing stone in the distal right ureter, additional 6 mm stone, possible pyelonephritis. Urology consulted, s/p right ureteral stent on 3/11.  PMH: chronic back pain, chronic Foley catheter for urinary retention, nephrolithiasis, hypertension, depression, dementia .     Clinical Impressions Patient is currently requiring as high as Total assistance with basic ADLs, as well as  moderate assist with bed mobility and moderate assist with functional transfers to recliner with use of RW.   Current level of function is below patient's typical baseline.  Pt insists that he required no assistance from his Cgs with his BADLs.   During this evaluation, patient was limited by RT flank pain with mobility and to touch, stiffness with difficulty flexing knees to roll or prepare to stand, generalized weakness, impaired activity tolerance, and cognitive deficits, all of which has the potential to impact patient's and/or caregivers' safety and independence during functional mobility, as well as performance for ADLs.    Patient lives with caregivers who would be unable to safely provide the necessary supervision and assistance that pt currently requires.   Patient demonstrates good to fair rehab potential, and should benefit from continued skilled occupational therapy services while in acute care to maximize safety, independence and quality of life at home.  Continued occupational therapy services in acute care are recommended. Patient will benefit from continued inpatient follow up therapy, <3 hours/day.   If pt returns home, [employer] and Wheelchair will be needed.    ?      If plan is discharge home, recommend the following:   Two people to help with walking and/or transfers;Assistance with cooking/housework;Assist for transportation;Supervision due to cognitive status;A lot of help with bathing/dressing/bathroom;A lot of help with walking and/or transfers     Functional Status Assessment   Patient has had a recent decline in their functional status and demonstrates the ability to make significant improvements in function in a reasonable and predictable amount of time.     Equipment Recommendations   Wheelchair (measurements OT);Hoyer lift     Recommendations for [employer]         Precautions/Restrictions   Precautions Precautions: Fall Precaution/Restrictions Comments: Pain to any touch on RT flank. Keep gait belt high under arms Restrictions Weight Bearing Restrictions Per Provider Order: No     Mobility Bed Mobility Overal bed mobility: Needs Assistance Bed Mobility: Supine to Sit     Supine to sit: Used rails, HOB elevated, Mod assist     General bed mobility comments: Increased time, pt unable to tolerate rolling to LT. Very stiff LEs with inabiality to keep knee flexed. Pt slowly advanced LEs off EOB with Min As and then required Mod Assist to pull up trunk. Pt unable to reach hand rails until very close to it, then pt able to assist with trunk somewhat.    Transfers                          Balance Overall balance assessment: Needs assistance, History of Falls Sitting-balance support: Single extremity supported, Feet supported Sitting balance-Leahy Scale: Fair     Standing balance support: Bilateral upper extremity supported, Rel[place]  on assistive device for balance, During functional activity Standing balance-Leahy Scale: Poor                             ADL either performed or assessed with clinical judgement   ADL Overall ADL's : Needs assistance/impaired Eating/Feeding:  Supervision/ safety;Set up;Sitting Eating/Feeding Details (indicate cue type and reason): Pt given menu and hospital phone once in chair. Grooming: Wash/dry face;Sitting;Set up;Supervision/safety   Upper Body Bathing: Minimal assistance;Cueing for compensatory techniques;Bed level   Lower Body Bathing: Maximal assistance;Sitting/lateral leans;Bed level   Upper Body Dressing : Moderate assistance;Sitting;Cueing for sequencing   Lower Body Dressing: Total assistance;Bed level   Toilet Transfer: Moderate assistance;Rolling walker (2 wheels);Stand-pivot;Cueing for sequencing;Cueing for safety Toilet Transfer Details (indicate cue type and reason): Cues to keep B feet inside RW with need of assist to navigate RW while pivoting to recliner. Pt stood from EOB (elevated partially) to RW with rocking to gain power-up momentum and Mod As. Pt very slow to pivot and reported this is baseline and required Min-Mod As to safely pivot and descnd to chair with cues.   Toileting - Clothing Manipulation Details (indicate cue type and reason): Pt with foley cath and no bowel void needs. Would anticipate at least Mod-Max As for hygiene and clothing management based on general assessment.     Functional mobility during ADLs: Moderate assistance;Rolling walker (2 wheels);Cueing for safety;Cueing for sequencing       Vision   Vision Assessment?: No apparent visual deficits     Perception         Praxis         Pertinent Vitals/Pain Pain Assessment Pain Assessment: 0-10 Pain Score: 9  Pain Location: No pain at rest. 9/10 RT flank pain with mobility. Pt refusing back to bed despite pain and insistent on moving to recliner. Pain Descriptors / Indicators: Tender Pain Intervention(s): Limited activity within patient's tolerance, Monitored during session, Repositioned, Patient requesting pain meds-RN notified (Call bell not answered 2nd time (RN to room 1st callbell for IV alarm. Secure chatted RN re:  request for pain meds)     Extremity/Trunk Assessment Upper Extremity Assessment Upper Extremity Assessment: Defer to OT evaluation RUE Deficits / Details: ROM of shoulder to ~110 degrees. No MMT due to pain of RT flank. LUE Deficits / Details: Pt stopped testing after raising RUE overhead due to pain.   Lower Extremity Assessment Lower Extremity Assessment: RLE deficits/detail;LLE deficits/detail RLE Deficits / Details: knee flexion to 90* AAROM; ankle AROM to neutral only; strength 2+ to 3/5 grossly with testing ltd d/t pt having pain LEs to touch RLE: Unable to fully assess due to pain RLE Sensation: history of peripheral neuropathy LLE Deficits / Details: knee flexion to 90* AAROM; ankle AROM to neutral only; strength 2+ to 3/5 grossly with testing ltd d/t pt having pain LEs to touch LLE: Unable to fully assess due to pain LLE Sensation: history of peripheral neuropathy       Communication Communication Communication: No apparent difficulties Factors Affecting Communication: Hearing impaired   Cognition Arousal: Alert Behavior During Therapy: Anxious Cognition: Cognition impaired, No family/caregiver present to determine baseline       Memory impairment (select all impairments):  (h/o dementia per chart)   Executive functioning impairment (select all impairments): Sequencing, Reasoning, Problem solving OT - Cognition Comments: Pt is rather particular and seems to have established a routine which works for him. Pt likes to know step  by step what the plan is and then requires repitition. He does not consistently follow commands, but when reinforced pt will naswer "I know".                 Following commands: Impaired Following commands impaired: Only follows one step commands consistently (requires repetition)     Cueing  General Comments   Cueing Techniques: Verbal cues;Visual cues      Exercises     Shoulder Instructions      Home Living Family/patient  expects to be discharged to:: Private residence Living Arrangements: Alone;Non-relatives/Friends;Other (Comment) Available Help at Discharge: Personal care attendant Type of Home: House Home Access: Ramped entrance     Home Layout: One level     Bathroom Shower/Tub: Producer, television/film/video: Handicapped height Bathroom Accessibility: Yes How Accessible: Accessible via wheelchair Home Equipment: Shower seat - built [employer] fundraiser (2 wheels);Cane - single point;Hand held shower head;BSC/3in1;Hospital bed          Prior Functioning/Environment Prior Level of Function : Needs assist       Physical Assist : ADLs (physical);Mobility (physical) Mobility (physical): Bed mobility;Transfers;Gait;Stairs ADLs (physical): IADLs Mobility Comments: household ambulator using RW, unable to wear socks/shoes due to causing nerve pain in feet. ADLs Comments: patient states independent with basic ADL, aid assists with cooking/cleaning, IADLs    OT Problem List: Decreased range of motion;Decreased safety awareness;Decreased activity tolerance;Pain;Decreased knowledge of use of DME or AE;Impaired balance (sitting and/or standing);Impaired sensation   OT Treatment/Interventions: Self-care/ADL training;Therapeutic activities;Therapeutic exercise;Patient/family education;DME and/or AE instruction;Balance training      OT Goals(Current goals can be found in the care plan section)   Acute Rehab OT Goals Patient Stated Goal: To go home. OT Goal Formulation: With patient Time For Goal Achievement: 05/06/23 Potential to Achieve Goals: Fair ADL Goals Pt Will Perform Upper Body Dressing: with set-up;sitting Pt Will Perform Lower Body Dressing: with set-up;with adaptive equipment;sitting/lateral leans;sit to/from stand Pt Will Transfer to Toilet: with contact guard assist;ambulating Pt Will Perform Toileting - Clothing Manipulation and hygiene: with adaptive equipment;with contact guard  assist Pt/caregiver will Perform Home Exercise Program: Both right and left upper extremity;Increased strength;Increased ROM (Increase flexibility.) Additional ADL Goal #1: Patient will identify at least 3 fall prevention strategies to employ at home in order to maximize function and safety during ADLs and decrease caregiver burden while preventing possible injury and rehospitalization.   OT Frequency:  Min 1X/week    Co-evaluation              AM-PAC OT "6 Clicks" Daily Activity     Outcome Measure Help from another person eating meals?: A Little Help from another person taking care of personal grooming?: A Little Help from another person toileting, which includes using toliet, bedpan, or urinal?: A Lot Help from another person bathing (including washing, rinsing, drying)?: A Lot Help from another person to put on and taking off regular upper body clothing?: A Lot Help from another person to put on and taking off regular lower body clothing?: Total 6 Click Score: 13   End of Session Equipment Utilized During Treatment: Gait belt;Rolling walker (2 wheels) Nurse Communication: Mobility status;Patient requests pain meds  Activity Tolerance: Patient limited by pain Patient left: in chair;with call bell/phone within reach;with chair alarm set  OT Visit Diagnosis: Unsteadiness on feet (R26.81);Pain;History of falling (Z91.81);Muscle weakness (generalized) (M62.81);Other symptoms and signs involving cognitive function Pain - Right/Left: Right Pain - part of body:  (flank)  Time: 1125-1205 OT Time Calculation (min): 40 min Charges:  OT General Charges $OT Visit: 1 Visit OT Evaluation $OT Eval Low Complexity: 1 Low OT Treatments $Self Care/Home Management : 8-22 mins $Therapeutic Activity: 8-22 mins  Victorino Dike, OT Acute Rehab Services Office: [phone] 04/22/2023  Theodoro Clock 04/22/2023, 1:51 PM

## 2023-04-22 NOTE — Evaluation (Signed)
 Physical Therapy Evaluation Patient Details Name: Luke Foster MRN: [id number] DOB: [birth year] Today's Date: 04/22/2023  History of Present Illness  73 year old male who presented from home with c/o worsening back pain; had fall ~ 1 wk prior to adm. CT head negative.  CT abdomen/pelvi showed moderate right hydronephrosis, hydroureter, 9 mm obstructing stone in the distal right ureter, additional 6 mm stone, possible pyelonephritis. Urology consulted, s/p right ureteral stent on 3/11.  PMH: chronic back pain, chronic Foley catheter for urinary retention, nephrolithiasis, hypertension, depression, dementia .  Clinical Impression  Pt admitted with above diagnosis.  Per pt and chart pt was ambulatory with RW, has 24 hour care at home; per OT noted pt is refusing SNF;  Pt was 2 person max-total assist for scooting transfers and bed mobility; briefly stood with flexed trunk and +2 max/total assist. At current status do not feel pt's caregivers would be able to manage his care and daily mobility tasks, if pt does d/c home will likely need [facility] lift.    Pt currently with functional limitations due to the deficits listed below (see PT Problem List). Pt will benefit from acute skilled PT to increase their independence and safety with mobility to allow discharge.           If plan is discharge home, recommend the following: Two people to help with walking and/or transfers;Two people to help with bathing/dressing/bathroom   Can travel by private vehicle   No    Equipment Recommendations  (if home may need hoyer lift)  Recommendations for Other Services       Functional Status Assessment Patient has had a recent decline in their functional status and demonstrates the ability to make significant improvements in function in a reasonable and predictable amount of time.     Precautions / Restrictions Precautions Precautions: Fall Restrictions Weight Bearing Restrictions Per Provider Order: No       Mobility  Bed Mobility Overal bed mobility: Needs Assistance Bed Mobility: Sit to Supine, Rolling Rolling: Max assist     Sit to supine: Total assist, +2 for physical assistance   General bed mobility comments: max assist to roll to R side; pt requring heavy assist to control and position trunk and lift bil LEs on to bed; with bed in trendelenberg pt able to use rails and scoot to [facility] of his own volition    Transfers Overall transfer level: Needs assistance Equipment used: Rolling walker (2 wheels) Transfers: Sit to/from Stand, Bed to chair/wheelchair/BSC Sit to Stand: Max assist, Total assist, +2 physical assistance, +2 safety/equipment          Lateral/Scoot Transfers: Max assist, +2 physical assistance, +2 safety/equipment General transfer comment: pt unable to achieve hip/trunk extension in standing, and with need for immediate sitting rest; lateral scooting transfer with sliding board with +2 max assist, pt with varied ability to self assist    Ambulation/Gait                  Stairs            Wheelchair Mobility     Tilt Bed    Modified Rankin (Stroke Patients Only)       Balance Overall balance assessment: Needs assistance, History of Falls Sitting-balance support: Feet supported, Single extremity supported, No upper extremity supported Sitting balance-Leahy Scale: Fair       Standing balance-Leahy Scale: Zero  Pertinent Vitals/Pain      Home Living Family/patient expects to be discharged to:: Private residence Living Arrangements: Alone;Non-relatives/Friends;Other (Comment) Available Help at Discharge: Personal care attendant Type of Home: House Home Access: Ramped entrance       Home Layout: One level Home Equipment: Shower seat - built [employer] fundraiser (2 wheels);Cane - single point;Hand held shower head;BSC/3in1;Hospital bed      Prior Function Prior Level of Function : Needs  assist       Physical Assist : ADLs (physical);Mobility (physical) Mobility (physical): Bed mobility;Transfers;Gait;Stairs ADLs (physical): IADLs Mobility Comments: household ambulator using RW, unable to wear socks/shoes due to causing nerve pain in feet. ADLs Comments: patient states independent with basic ADL, aid assists with cooking/cleaning, IADLs     Extremity/Trunk Assessment   Upper Extremity Assessment Upper Extremity Assessment: Defer to OT evaluation RUE Deficits / Details: ROM of shoulder to ~110 degrees. No MMT due to pain of RT flank. LUE Deficits / Details: Pt stopped testing after raising RUE overhead due to pain.    Lower Extremity Assessment Lower Extremity Assessment: RLE deficits/detail;LLE deficits/detail RLE Deficits / Details: knee flexion to 90* AAROM; ankle AROM to neutral only; strength 2+ to 3/5 grossly with testing ltd d/t pt having pain LEs to touch RLE: Unable to fully assess due to pain RLE Sensation: history of peripheral neuropathy LLE Deficits / Details: knee flexion to 90* AAROM; ankle AROM to neutral only; strength 2+ to 3/5 grossly with testing ltd d/t pt having pain LEs to touch LLE: Unable to fully assess due to pain LLE Sensation: history of peripheral neuropathy       Communication   Communication Communication: No apparent difficulties Factors Affecting Communication: Hearing impaired    Cognition Arousal: Alert Behavior During Therapy: WFL for tasks assessed/performed   PT - Cognitive impairments: History of cognitive impairments                       PT - Cognition Comments: hx of dementia Following commands: Impaired       Cueing       General Comments      Exercises     Assessment/Plan    PT Assessment Patient needs continued PT services  PT Problem List Decreased strength;Decreased activity tolerance;Decreased balance;Decreased range of motion;Decreased mobility;Decreased coordination;Decreased safety  awareness       PT Treatment Interventions DME instruction;Therapeutic exercise;Gait training;Functional mobility training;Therapeutic activities;Patient/family education    PT Goals (Current goals can be found in the Care Plan section)  Acute Rehab PT Goals PT Goal Formulation: Patient unable to participate in goal setting Time For Goal Achievement: 05/06/23 Potential to Achieve Goals: Fair    Frequency Min 2X/week     Co-evaluation               AM-PAC PT "6 Clicks" Mobility  Outcome Measure Help needed turning from your back to your side while in a flat bed without using bedrails?: Total Help needed moving from lying on your back to sitting on the side of a flat bed without using bedrails?: Total Help needed moving to and from a bed to a chair (including a wheelchair)?: Total Help needed standing up from a chair using your arms (e.g., wheelchair or bedside chair)?: Total Help needed to walk in hospital room?: Total Help needed climbing 3-5 steps with a railing? : Total 6 Click Score: 6    End of Session Equipment Utilized During Treatment: Gait belt;Other (comment) (sliding board) Activity  Tolerance: Patient limited by fatigue;Patient limited by pain Patient left: in bed;with call bell/phone within reach;with bed alarm set Nurse Communication: Mobility status PT Visit Diagnosis: Other abnormalities of gait and mobility (R26.89)    Time: 1610-9604 PT Time Calculation (min) (ACUTE ONLY): 33 min   Charges:   PT Evaluation $PT Eval Low Complexity: 1 Low PT Treatments $Therapeutic Activity: 8-22 mins PT General Charges $$ ACUTE PT VISIT: 1 Visit         Mercades Bajaj, PT  Acute Rehab Dept ([facility]) [phone]  04/22/2023   [facility] 04/22/2023, 1:12 PM

## 2023-04-22 NOTE — Consult Note (Signed)
 Luke Foster 21-Mar-1950  [id number].    Requesting MD: Dr. Burnadette Pop Chief Complaint/Reason for Consult: ileus vs possible psbo  HPI:  This is a 73 yo male with a history of CBP, significant bladder/kidney stones requiring multiple procedures, chronic foley catheter use, dementia, HTN, and depression who was admitted after falling at home about a week prior to admission and having back pain.  He presented to the ED and was found to have moderate right hydronephrosis, hydroureter with a 9mm obstructing stone and possible pyelonephritis.  He was admitted and started on IV abx therapy and urology was consulted.  He had a right ureteral stent placement on 3/11.  He has continued to have pain and is more distended today.  A repeat CT scan was done which revealed resolution of the hydronephrosis but development of hemorrhage within a large right renal cyst as well as a possible ileus vs developing sbo.  He has had prior abdominal surgery with an ex lap and SBR in 01/2022 by Dr. Robyne Peers.  He denies nausea/vomiting, but his HCPOA at the bedside reports his PO intake has been poor the last couple days, and distention has actually been increasing over the last couple weeks.  He is passing small amounts of flatus.  His last BM was Sunday, but his HCPOA notes that he has to maintain a bowel regimen typically due to significant nerve damage to stay regular and has been less regular for the last couple weeks. Patient denies pain but feels tight. We have been asked to see for the above findings.  ROS: ROS: see HPI  Family History  Problem Relation Age of Onset   Heart disease Father        Living, 64   Breast cancer Mother        Living, 60   Hypercholesterolemia Mother    Healthy Brother    Healthy Sister     Past Medical History:  Diagnosis Date   Arthritis    Chronic back pain    Chronic pain 1999   Bilateral feet (R >L)   Dementia (HCC) 04/19/2023   Difficult or painful urination     High blood pressure    History of kidney stones    H/O   Nerve damage    Pain management    Paresthesia of foot, bilateral    Renal disorder    Sleep apnea    USES CPAP   Weakness of both legs     Past Surgical History:  Procedure Laterality Date   arm surgery     BACK SURGERY     X2   BOTOX INJECTION N/A 10/06/2021   Procedure: CYSTOSCOPY LITHOLAPAXY WITH BOTOX INJECTION;  Surgeon: Crista Elliot, MD;  Location: [facility];  Service: Urology;  Laterality: N/A;   BOTOX INJECTION N/A 04/20/2022   Procedure: CYSTOSCOPY & BOTOX INJECTION, BLADDER FULGARATION;  Surgeon: Crista Elliot, MD;  Location: [facility];  Service: Urology;  Laterality: N/A;  45 MINS   BOTOX INJECTION N/A 12/28/2022   Procedure: CYSTOSCOPY WITH INTRA DETRUSOR BOTOX INJECTION;  Surgeon: Crista Elliot, MD;  Location: [facility];  Service: Urology;  Laterality: N/A;  45 MINS FOR CASE   BOWEL RESECTION N/A 01/15/2022   Procedure: SMALL BOWEL RESECTION;  Surgeon: Lewie Chamber, DO;  Location: [facility];  Service: General;  Laterality: N/A;   CYSTOSCOPY W/ URETERAL STENT PLACEMENT Right 11/29/2021   Procedure: CYSTOSCOPY WITH RETROGRADE PYELOGRAM/URETERAL STENT PLACEMENT;  Surgeon: Bjorn Pippin, MD;  Location: [facility];  Service: Urology;  Laterality: Right;   CYSTOSCOPY WITH STENT PLACEMENT Right 04/20/2023   Procedure: CYSTOSCOPY, RETROGRADE PYELOGRAM, RIGHT STENT INSERTION;  Surgeon: Despina Arias, MD;  Location: [facility];  Service: Urology;  Laterality: Right;   CYSTOSCOPY/URETEROSCOPY/HOLMIUM LASER/STENT PLACEMENT Right 01/05/2022   Procedure: CYSTOSCOPY RIGHT URETEROSCOPY/HOLMIUM LASER/STENT PLACEMENT basket extraction stones;  Surgeon: Crista Elliot, MD;  Location: [facility];  Service: Urology;  Laterality: Right;  1 HR FOR CASE   FACIAL COSMETIC SURGERY     HERNIA REPAIR     IR US GUIDE BX ASP/DRAIN  10/19/2017   KIDNEY STONE SURGERY     LAPAROTOMY N/A 01/15/2022   Procedure: Exploratory laparotomy;   Surgeon: Lewie Chamber, DO;  Location: [facility];  Service: General;  Laterality: N/A;   LUMBAR SPINAL CORD SIMULATOR LEAD REMOVAL N/A 03/01/2019   Procedure: LUMBAR SPINAL CORD SIMULATOR LEAD REMOVAL;  Surgeon: Venetia Night, MD;  Location: [facility];  Service: Neurosurgery;  Laterality: N/A;   MASTECTOMY Left    SPINAL CORD STIMULATOR REMOVAL N/A 03/01/2019   Procedure: LUMBAR SPINAL CORD STIMULATOR REMOVAL;  Surgeon: Venetia Night, MD;  Location: [facility];  Service: Neurosurgery;  Laterality: N/A;   WISDOM TOOTH EXTRACTION      Social History:  reports that he quit smoking about 31 years ago. His smoking use included cigarettes. He started smoking about 41 years ago. He has a 20 pack-year smoking history. He quit smokeless tobacco use about 31 years ago. He reports current alcohol use. He reports that he does not use drugs.  Allergies:  Allergies  Allergen Reactions   Erythromycin Itching    Medications Prior to Admission  Medication Sig Dispense Refill   acetaminophen (TYLENOL) 500 MG tablet Take 1,000 mg by mouth every 6 (six) hours as needed for mild pain or moderate pain.     ascorbic acid (VITAMIN C) 500 MG tablet Take 500 mg by mouth daily.     Cholecalciferol (VITAMIN D) 50 MCG (2000 UT) CAPS Take 2,000 Units by mouth daily.     cyanocobalamin (VITAMIN B12) 1000 MCG tablet Take 1,000 mcg by mouth in the morning and at bedtime.     DILT-XR 120 MG 24 hr capsule Take 120 mg by mouth daily.     docusate sodium (COLACE) 100 MG capsule Take 1 capsule (100 mg total) by mouth in the morning, at noon, and at bedtime. (Patient taking differently: Take 300 mg by mouth at bedtime.) 90 capsule 0   enalapril (VASOTEC) 5 MG tablet Take 5 mg by mouth 2 (two) times daily.     lidocaine (LIDODERM) 5 % Place 1 patch onto the skin daily as needed (pain).     oxyCODONE (ROXICODONE) 15 MG immediate release tablet Take 1 tablet (15 mg total) by mouth every 6 (six) hours as needed for  pain. (Patient taking differently: Take 15 mg by mouth every 4 (four) hours as needed for pain.) 30 tablet 0   polyethylene glycol (MIRALAX / GLYCOLAX) 17 g packet Take 17 g by mouth 2 (two) times daily. 14 each 3   rOPINIRole (REQUIP) 0.5 MG tablet Take 0.5 mg by mouth at bedtime as needed (restless legs).     solifenacin (VESICARE) 5 MG tablet Take 5 mg by mouth daily.     sulfamethoxazole-trimethoprim (BACTRIM) 400-80 MG tablet Take 1 tablet by mouth at bedtime.     tamsulosin (FLOMAX) 0.4 MG CAPS capsule Take 1 capsule (0.4  mg total) by mouth every evening. (Patient taking differently: Take 0.4 mg by mouth at bedtime.) 30 capsule 0   venlafaxine XR (EFFEXOR-XR) 75 MG 24 hr capsule Take 1 capsule (75 mg total) by mouth in the morning and at bedtime. 60 capsule 0   Vibegron (GEMTESA PO) Take 50 mg by mouth at bedtime.     mirabegron ER (MYRBETRIQ) 50 MG TB24 tablet Take 1 tablet (50 mg total) by mouth daily. (Patient not taking: Reported on 04/19/2023) 30 tablet 0     Physical Exam: Blood pressure 138/79, pulse (!) 111, temperature 99 F (37.2 C), temperature source Oral, resp. rate 16, height 5\' 8"  (1.727 m), weight 81.6 kg, SpO2 95%.  General: pleasant, white male who is laying in bed in NAD HEENT: head is normocephalic, atraumatic.  Sclera are noninjected.   Heart: regular, rate, and rhythm.   Lungs: Respiratory effort nonlabored Abd: soft, distended, minimally diffusely tender, no palpable masses, hernias, or organomegaly Psych: A&Ox3 with an appropriate affect.   Results for orders placed or performed during the hospital encounter of 04/19/23 (from the past 48 hours)  TSH     Status: Abnormal   Collection Time: 04/21/23  5:22 AM  Result Value Ref Range   TSH 0.104 (L) 0.350 - 4.500 uIU/mL    Comment: Performed by a 3rd Generation assay with a functional sensitivity of <=0.01 uIU/mL. Performed at [facility], [address], [place], [place] [address]   CBC      Status: Abnormal   Collection Time: 04/21/23  5:25 AM  Result Value Ref Range   WBC 10.5 4.0 - 10.5 K/uL   RBC 3.00 (L) 4.22 - 5.81 MIL/uL   Hemoglobin 9.3 (L) 13.0 - 17.0 g/dL   HCT 78.2 (L) 95.6 - 21.3 %   MCV 96.3 80.0 - 100.0 fL   MCH 31.0 26.0 - 34.0 pg   MCHC 32.2 30.0 - 36.0 g/dL   RDW 08.6 57.8 - 46.9 %   Platelets 309 150 - 400 K/uL   nRBC 0.0 0.0 - 0.2 %    Comment: Performed at [facility], [address], [place], [place] [address]  Basic metabolic panel     Status: Abnormal   Collection Time: 04/21/23  5:25 AM  Result Value Ref Range   Sodium 133 (L) 135 - 145 mmol/L   Potassium 3.9 3.5 - 5.1 mmol/L   Chloride 105 98 - 111 mmol/L   CO2 16 (L) 22 - 32 mmol/L   Glucose, Bld 135 (H) 70 - 99 mg/dL    Comment: Glucose reference range applies only to samples taken after fasting for at least 8 hours.   BUN 40 (H) 8 - 23 mg/dL   Creatinine, Ser 8.41 (H) 0.61 - 1.24 mg/dL   Calcium 9.0 8.9 - 32.4 mg/dL   GFR, Estimated 53 (L) >60 mL/min    Comment: (NOTE) Calculated using the CKD-EPI Creatinine Equation (2021)    Anion gap 12 5 - 15    Comment: Performed at [facility], [address], [place], [place] [address]  Basic metabolic panel     Status: Abnormal   Collection Time: 04/22/23  5:41 AM  Result Value Ref Range   Sodium 137 135 - 145 mmol/L   Potassium 3.7 3.5 - 5.1 mmol/L   Chloride 104 98 - 111 mmol/L   CO2 21 (L) 22 - 32 mmol/L   Glucose, Bld 127 (H) 70 - 99 mg/dL  Comment: Glucose reference range applies only to samples taken after fasting for at least 8 hours.   BUN 35 (H) 8 - 23 mg/dL   Creatinine, Ser 1.61 0.61 - 1.24 mg/dL   Calcium 9.1 8.9 - 09.6 mg/dL   GFR, Estimated >04 >54 mL/min    Comment: (NOTE) Calculated using the CKD-EPI Creatinine Equation (2021)    Anion gap 12 5 - 15    Comment: Performed at [facility], [address], [place], [place] [address]   CT ABDOMEN PELVIS WO  CONTRAST Result Date: 04/22/2023 CLINICAL DATA:  Abdominal pain. EXAM: CT ABDOMEN AND PELVIS WITHOUT CONTRAST TECHNIQUE: Multidetector CT imaging of the abdomen and pelvis was performed following the standard protocol without IV contrast. RADIATION DOSE REDUCTION: This exam was performed according to the departmental dose-optimization program which includes automated exposure control, adjustment of the mA and/or kV according to patient size and/or use of iterative reconstruction technique. COMPARISON:  CT abdomen pelvis dated 04/19/2023. FINDINGS: Evaluation of this exam is limited due to respiratory motion as well as in the absence of intravenous contrast. Lower chest: Partially visualized small right pleural effusion and partial compressive atelectasis of the right lower lobe. Pneumonia is not excluded. Minimal left lung base linear atelectasis. There is coronary vascular calcification. No intra-abdominal free air or free fluid. Hepatobiliary: The liver is unremarkable. No biliary dilatation. Layering sludge noted in the gallbladder. No pericholecystic fluid. No calcified gallstone. Pancreas: Unremarkable. No pancreatic ductal dilatation or surrounding inflammatory changes. Spleen: Normal in size without focal abnormality. Adrenals/Urinary Tract: The right adrenal glands unremarkable. Indeterminate 1 cm left adrenal nodule. Small left renal interpolar cyst. There is a 5 mm nonobstructing left renal inferior pole calculus. There has been interval placement of a right pigtail ureteral stent with proximal tip in the right renal collecting system and distal end in the urinary bladder. Near complete resolution of the previously seen right hydronephrosis. Multiple nonobstructing right renal inferior pole calculi with combined length of approximately 1 cm. There has been interval development of moderate amount of high attenuating content within the large right renal cyst consistent with hemorrhage. Small pockets of air  also noted within this collection there is right perinephric stranding and possible small hematoma. Relatively similar location of a 9 mm distal right ureteral calculus or the pelvic brim. The additional nonobstructing smaller stone in the distal right ureter is no longer visualized and likely past into the bladder. The urinary bladder is minimally distended. Similar appearance of a cluster of small stones along the posterior bladder wall. A Foley catheter noted in the bladder. Tiny pocket of air within the urinary bladder, likely introduced via catheter. Stomach/Bowel: Postsurgical changes of bowel with anastomotic staple line in the lower abdomen. Mildly dilated small bowel loops measure up to 3.3 cm in caliber. The terminal ileum and distal small bowel in the right lower quadrant are collapsed. Although a transition appears to be present in the right lower quadrant, findings may represent an ileus or developing obstruction. Follow-up with small-bowel series recommended to document passage of contrast into the colon. Vascular/Lymphatic: Moderate aortoiliac atherosclerotic disease. The IVC is unremarkable. No portal venous gas. There is no adenopathy. Reproductive: Enlarged prostate gland with median lobe hypertrophy indenting with bladder. Other: Subcutaneous edema of the right lateral abdominal wall. Musculoskeletal: Osteopenia with degenerative changes of the spine. Lower lumbar posterior fusion. No acute osseous pathology. IMPRESSION: 1. Interval placement of a right pigtail ureteral stent with near complete resolution of the previously seen  right hydronephrosis. 2. Interval development of moderate amount of hemorrhage within the large right renal cyst. 3. Relatively similar location of a 9 mm distal right ureteral calculus or the pelvic brim. 4. Ileus versus developing small-bowel obstruction. Follow-up with small-bowel series recommended. Electronically Signed   By: Elgie Collard M.D.   On: 04/22/2023  14:14      Assessment/Plan Ileus vs possible psbo The patient has been seen, examined, labs, vitals, chart, and imaging personally reviewed.  Given his current situation with his recent stent placement, infection, and now renal hemorrhage, it is more likely the patient has a reactive ileus secondary to these issues.  However, he has had prior abdominal surgery and could be developing a SBO secondary to adhesive disease.  He was given oral contrast for his CT scan.  We will repeat a plain film in the morning to see if contrast has progressed to his colon.  He is currently not having N/V so can hold off on NGT placement, but would recommend placing one if he does develop nausea or emesis.  No acute needs for surgical intervention at this time.   FEN - NPO VTE - on hold due to hemorrhage ID - Maxipime   I reviewed hospitalist notes, last 24 h vitals and pain scores, last 48 h intake and output, last 24 h labs and trends, and last 24 h imaging results.  Berna Bue MD [facility] Surgery 04/22/2023, 2:39 PM Please see Amion for pager number during day hours 7:00am-4:30pm or 7:00am -11:30am on weekends

## 2023-04-23 ENCOUNTER — Inpatient Hospital Stay (HOSPITAL_COMMUNITY)

## 2023-04-23 DIAGNOSIS — N12 Tubulo-interstitial nephritis, not specified as acute or chronic: Secondary | ICD-10-CM | POA: Diagnosis not present

## 2023-04-23 LAB — BASIC METABOLIC PANEL
Anion gap: 10 (ref 5–15)
BUN: 34 mg/dL — ABNORMAL HIGH (ref 8–23)
CO2: 24 mmol/L (ref 22–32)
Calcium: 8.8 mg/dL — ABNORMAL LOW (ref 8.9–10.3)
Chloride: 100 mmol/L (ref 98–111)
Creatinine, Ser: 1.2 mg/dL (ref 0.61–1.24)
GFR, Estimated: >60 mL/min (ref >60)
Glucose, Bld: 116 mg/dL — ABNORMAL HIGH (ref 70–99)
Potassium: 3.9 mmol/L (ref 3.5–5.1)
Sodium: 134 mmol/L — ABNORMAL LOW (ref 135–145)

## 2023-04-23 LAB — CBC
HCT: 29.7 % — ABNORMAL LOW (ref 39.0–52.0)
Hemoglobin: 9.5 g/dL — ABNORMAL LOW (ref 13.0–17.0)
MCH: 30.7 pg (ref 26.0–34.0)
MCHC: 32 g/dL (ref 30.0–36.0)
MCV: 96.1 fL (ref 80.0–100.0)
Platelets: 409 10*3/uL — ABNORMAL HIGH (ref 150–400)
RBC: 3.09 MIL/uL — ABNORMAL LOW (ref 4.22–5.81)
RDW: 13.7 % (ref 11.5–15.5)
WBC: 19.4 10*3/uL — ABNORMAL HIGH (ref 4.0–10.5)
nRBC: 0 % (ref 0.0–0.2)

## 2023-04-23 MED ORDER — PIPERACILLIN-TAZOBACTAM 3.375 G IVPB
3.3750 g | Freq: Three times a day (TID) | INTRAVENOUS | Status: DC
  Administered 2023-04-23 – 2023-04-24 (×3): 3.375 g via INTRAVENOUS
  Filled 2023-04-23 (×3): qty 50

## 2023-04-23 MED ORDER — DIATRIZOATE MEGLUMINE & SODIUM 66-10 % PO SOLN
90.0000 mL | Freq: Once | ORAL | Status: AC
  Administered 2023-04-23: 90 mL via NASOGASTRIC
  Filled 2023-04-23: qty 90

## 2023-04-23 NOTE — Progress Notes (Addendum)
 3 Days Post-Op  Subjective: CC: Seen with RN.  He complains of nausea yesterday. Has continued abdominal distension and R sided abdominal cramping. No flatus or BM. Xray with dilated loops of small bowel - read pending.   Objective: Vital signs in last 24 hours: Temp:  [97.5 F (36.4 C)-99.4 F (37.4 C)] 98.6 F (37 C) (03/14 0511) Pulse Rate:  [65-113] 106 (03/14 0511) Resp:  [15-17] 15 (03/14 0511) BP: (118-138)/(69-86) 136/86 (03/14 0511) SpO2:  [91 %-97 %] 96 % (03/14 0511) Last BM Date : 04/18/23  Intake/Output from previous day: 03/13 0701 - 03/14 0700 In: 2953.9 [P.O.:900; I.V.:1854; IV Piggyback:199.9] Out: 2400 [Urine:2400] Intake/Output this shift: No intake/output data recorded.  PE: Gen:  Alert, NAD, pleasant Abd: Moderate distension but soft, generalized ttp greatest on the R side of his abdomen. Had voluntary guarding at least initially on the R side that resolved w/ repeat evaluation. No rigidity. Hypoactive BS.   Lab Results:  Recent Labs    04/21/23 0525 04/23/23 0439  WBC 10.5 19.4*  HGB 9.3* 9.5*  HCT 28.9* 29.7*  PLT 309 409*   BMET Recent Labs    04/22/23 0541 04/23/23 0439  NA 137 134*  K 3.7 3.9  CL 104 100  CO2 21* 24  GLUCOSE 127* 116*  BUN 35* 34*  CREATININE 1.21 1.20  CALCIUM 9.1 8.8*   PT/INR No results for input(s): "LABPROT", "INR" in the last 72 hours. CMP     Component Value Date/Time   NA 134 (L) 04/23/2023 0439   K 3.9 04/23/2023 0439   CL 100 04/23/2023 0439   CO2 24 04/23/2023 0439   GLUCOSE 116 (H) 04/23/2023 0439   BUN 34 (H) 04/23/2023 0439   CREATININE 1.20 04/23/2023 0439   CALCIUM 8.8 (L) 04/23/2023 0439   PROT 6.7 04/20/2023 0450   ALBUMIN 2.7 (L) 04/20/2023 0450   AST 18 04/20/2023 0450   ALT 20 04/20/2023 0450   ALKPHOS 83 04/20/2023 0450   BILITOT 0.7 04/20/2023 0450   GFRNONAA >60 04/23/2023 0439   GFRAA >60 04/26/2019 1721   Lipase     Component Value Date/Time   LIPASE 34 06/17/2022  1323    Studies/Results: CT ABDOMEN PELVIS WO CONTRAST Result Date: 04/22/2023 CLINICAL DATA:  Abdominal pain. EXAM: CT ABDOMEN AND PELVIS WITHOUT CONTRAST TECHNIQUE: Multidetector CT imaging of the abdomen and pelvis was performed following the standard protocol without IV contrast. RADIATION DOSE REDUCTION: This exam was performed according to the departmental dose-optimization program which includes automated exposure control, adjustment of the mA and/or kV according to patient size and/or use of iterative reconstruction technique. COMPARISON:  CT abdomen pelvis dated 04/19/2023. FINDINGS: Evaluation of this exam is limited due to respiratory motion as well as in the absence of intravenous contrast. Lower chest: Partially visualized small right pleural effusion and partial compressive atelectasis of the right lower lobe. Pneumonia is not excluded. Minimal left lung base linear atelectasis. There is coronary vascular calcification. No intra-abdominal free air or free fluid. Hepatobiliary: The liver is unremarkable. No biliary dilatation. Layering sludge noted in the gallbladder. No pericholecystic fluid. No calcified gallstone. Pancreas: Unremarkable. No pancreatic ductal dilatation or surrounding inflammatory changes. Spleen: Normal in size without focal abnormality. Adrenals/Urinary Tract: The right adrenal glands unremarkable. Indeterminate 1 cm left adrenal nodule. Small left renal interpolar cyst. There is a 5 mm nonobstructing left renal inferior pole calculus. There has been interval placement of a right pigtail ureteral stent with proximal tip in  the right renal collecting system and distal end in the urinary bladder. Near complete resolution of the previously seen right hydronephrosis. Multiple nonobstructing right renal inferior pole calculi with combined length of approximately 1 cm. There has been interval development of moderate amount of high attenuating content within the large right renal cyst  consistent with hemorrhage. Small pockets of air also noted within this collection there is right perinephric stranding and possible small hematoma. Relatively similar location of a 9 mm distal right ureteral calculus or the pelvic brim. The additional nonobstructing smaller stone in the distal right ureter is no longer visualized and likely past into the bladder. The urinary bladder is minimally distended. Similar appearance of a cluster of small stones along the posterior bladder wall. A Foley catheter noted in the bladder. Tiny pocket of air within the urinary bladder, likely introduced via catheter. Stomach/Bowel: Postsurgical changes of bowel with anastomotic staple line in the lower abdomen. Mildly dilated small bowel loops measure up to 3.3 cm in caliber. The terminal ileum and distal small bowel in the right lower quadrant are collapsed. Although a transition appears to be present in the right lower quadrant, findings may represent an ileus or developing obstruction. Follow-up with small-bowel series recommended to document passage of contrast into the colon. Vascular/Lymphatic: Moderate aortoiliac atherosclerotic disease. The IVC is unremarkable. No portal venous gas. There is no adenopathy. Reproductive: Enlarged prostate gland with median lobe hypertrophy indenting with bladder. Other: Subcutaneous edema of the right lateral abdominal wall. Musculoskeletal: Osteopenia with degenerative changes of the spine. Lower lumbar posterior fusion. No acute osseous pathology. IMPRESSION: 1. Interval placement of a right pigtail ureteral stent with near complete resolution of the previously seen right hydronephrosis. 2. Interval development of moderate amount of hemorrhage within the large right renal cyst. 3. Relatively similar location of a 9 mm distal right ureteral calculus or the pelvic brim. 4. Ileus versus developing small-bowel obstruction. Follow-up with small-bowel series recommended. Electronically Signed    By: Elgie Collard M.D.   On: 04/22/2023 14:14    Anti-infectives: Anti-infectives (From admission, onward)    Start     Dose/Rate Route Frequency Ordered Stop   04/21/23 1330  ceFEPIme (MAXIPIME) 2 g in sodium chloride 0.9 % 100 mL IVPB        2 g 200 mL/hr over 30 Minutes Intravenous Every 12 hours 04/21/23 1242     04/20/23 1800  cefTRIAXone (ROCEPHIN) 1 g in sodium chloride 0.9 % 100 mL IVPB  Status:  Discontinued        1 g 200 mL/hr over 30 Minutes Intravenous Every 24 hours 04/19/23 2244 04/21/23 1231   04/19/23 1815  cefTRIAXone (ROCEPHIN) 1 g in sodium chloride 0.9 % 100 mL IVPB        1 g 200 mL/hr over 30 Minutes Intravenous  Once 04/19/23 1806 04/19/23 2015        Assessment/Plan Ileus vs possible sbo - CT 3/13 w/ mildly dilated small bowel loops w/ possible transition in the RLQ w/ collapsed distal small bowel in the RLQ. Hx of ex lap and SBR in 01/2022 by Dr. Robyne Peers for SBO.  - Given his current situation with his recent stent placement (3/11), infection, and now renal hemorrhage, it is more likely the patient has a reactive ileus secondary to these issues.  However, he has had prior abdominal surgery and could be developing a SBO secondary to adhesive disease.  - No current indication for emergency surgery however his WBC is up  today at 19.4 and tachy in the low 100's. He is currently on abx for UTI/Pyelo per notes. Will need to monitor very closely to ensure this is not being driven by his abdominal process as this may mean he needs OR sooner. Will review with attending as well. Keep NPO. Place NGT and start SBO protocol. AM labs written for.  - Keep K >=4, Phos >= 3, Mg >= 2 and mobilize for bowel function - Hopefully patient will improve with conservative management. If patient fails to improve with conservative management, they may require exploratory surgery during admission.  - We will follow with you. - I updated the patients HCPOA over the phone while I was  in the room   FEN - NPO, NGT to LIWS, IVF  VTE - SCDs, per primary ID - Cefepime for UTI/Pyelonephritis. Afebrile. HR 106. No hypotension. WBC 19.4 Foley - Per urology.    I reviewed nursing notes, Consultant (Urology) notes, hospitalist notes, last 24 h vitals and pain scores, last 48 h intake and output, last 24 h labs and trends, and last 24 h imaging results.   LOS: 4 days    Jacinto Halim, [facility] Surgery 04/23/2023, 8:18 AM Please see Amion for pager number during day hours 7:00am-4:30pm

## 2023-04-23 NOTE — TOC Progression Note (Addendum)
 Transition of Care [facility]) - Progression Note    Patient Details  Name: Luke Foster MRN: [id number] Date of Birth: 1950/06/23  Transition of [facility]) CM/SW Contact  Howell Rucks, RN Phone Number: 04/23/2023, 12:11 PM  Clinical Narrative:   Met with pt and his brother, Orvilla Fus, at bedside, pt has NGT in place, planned surgery for SBO. Family considering SNF.  TOC will continue to follow.     Expected Discharge Plan: Home w Home Health Services Barriers to Discharge: Continued Medical Work up  Expected Discharge Plan and Services       Living arrangements for the past 2 months: Single Family Home                                       Social Determinants of Health (SDOH) Interventions SDOH Screenings   Food Insecurity: No Food Insecurity (04/20/2023)  Housing: Low Risk  (04/20/2023)  Transportation Needs: No Transportation Needs (04/20/2023)  Utilities: Not At Risk (04/20/2023)  Social Connections: Moderately Isolated (04/20/2023)  Tobacco Use: Medium Risk (04/22/2023)    Readmission Risk Interventions    04/22/2023   10:55 AM 06/19/2022    1:54 PM 01/16/2022    1:14 PM  Readmission Risk Prevention Plan  Transportation Screening Complete Complete Complete  PCP or Specialist Appt within 3-5 Days Complete    HRI or Home Care Consult Complete Complete Complete  Social Work Consult for Recovery Care Planning/Counseling Complete Complete Complete  Palliative Care Screening Not Applicable Not Applicable Not Applicable  Medication Review Oceanographer) Complete Complete Complete

## 2023-04-23 NOTE — Progress Notes (Signed)
 Pharmacy Antibiotic Note  Luke Foster is a 73 y.o. male admitted on 04/19/2023 with Hydronephrosis/Pyelonephritis/urolithiasis. Today, pt with leukocytosis, WBC trending up. Pharmacy has been consulted for Zosyn dosing for IAI.  Imaging:  DG ABD: Mildly dilated small bowel loops are noted concerning for ileus or possibly distal small bowel obstruction.  Plan: Zosyn 3.375g IV q8h (4 hour infusion). Monitor clinical progress, renal function, C&S, and LOT.  Height: 5\' 8"  (172.7 cm) Weight: 81.6 kg (180 lb) IBW/kg (Calculated) : 68.4  Temp (24hrs), Avg:98.7 F (37.1 C), Min:97.5 F (36.4 C), Max:99.4 F (37.4 C)  Recent Labs  Lab 04/19/23 1501 04/20/23 0450 04/20/23 0455 04/21/23 0525 04/22/23 0541 04/23/23 0439  WBC 10.9*  --  9.5 10.5  --  19.4*  CREATININE 1.88* 1.75*  --  1.41* 1.21 1.20  LATICACIDVEN 1.7  1.9  --   --   --   --   --     Estimated Creatinine Clearance: 54.6 mL/min (by C-G formula based on SCr of 1.2 mg/dL).    Allergies  Allergen Reactions   Erythromycin Itching    Antimicrobials this admission: 3/14 Zosyn >>   Dose adjustments this admission: N/A  Microbiology results: 3/11 BCx: NGTD 3/10 UCx: 20k colonies/mL Enterobacter cloacae  Thank you for allowing pharmacy to be a part of this patient's care.   Lyn Henri, [facility] PharmD Candidate 04/23/2023 11:23 AM

## 2023-04-23 NOTE — Progress Notes (Addendum)
 PROGRESS NOTE  Luke Foster  ZOX:[id number] DOB: 1951-01-18 DOA: 04/19/2023 PCP: Elfredia Nevins, MD   Brief Narrative: Patient is a 73 year old male with history of chronic back pain, chronic Foley catheter for urinary retention, nephrolithiasis, hypertension, depression, dementia who presented from home with complaint of worsening back pain.  History of fall about a week ago resulting in increased back pain, worsening confusion.  On presentation, lab work showed creatinine of 1.8, WC count of 10.9.  CT imaging of the head did not show any acute findings.  CT abdomen/pelvis with contrast showed moderate right hydronephrosis, hydroureter, 9 mm obstructing stone in the distal right ureter, additional 6 mm stone, possible pyelonephritis .  Started on IV antibiotic.  Urology consulted, s/p right ureteral stent on 3/11.  Hospital course remarkable for persistent right flank pain,spams,abdominal distention.   CT abdomen/pelvis without contrast showed  right renal cyst  hemorrhage, SBO.  General surgery consulted.  Assessment & Plan:  Principal Problem:   Pyelonephritis Active Problems:   Neuropathy   Chronic pain syndrome   Status post insertion of spinal cord stimulator   Hypertension   Degenerative disc disease, lumbar   Urinary retention   Obstructive uropathy   Anxiety   Dementia (HCC)   Right-sided nephrolithiasis: Presented with right flank pain.  CT abdomen/pelvis showed moderate right hydronephrosis and hydroureter, secondary to a 9mm stone in the distal right ureter at the level of L5-S1.Also found to have an additional 6 mm stone within the ureter distal to the obstructing stone. There is perinephric fluid and stranding on the right in addition to mild urothelial enhancement raising possibility of superimposed infection. Imaging also showed features of cystitis, enlarged prostate.  Urology consulted.  S/P right-sided ureteral stent placement on 3/11. Patient continued to have right  flank pain, spasms.  CT abdomen/pelvis without contrast done on 3/13 showed right renal cyst with hemorrhage.  Continue pain management  SBO: New problem.  He has history of SBO in the past with exploratory laparotomy.  Currently in bed.  NG tube being placed.  Increase in the white cell count today.  Low threshold for explorative laparotomy as per general surgery.  Antibiotics changed to Zosyn.  Continue gentle IV fluids  Suspected pyelonephritis  Currently afebrile.    Leukocytosis resolved.  Urine culture showing 20,000 colonies of resistant Enterobacter cloacae.  He was on cefepime.  Blood cultures have not shown any growth.  Now on Zosy  Chronic urinary retention: On Foley chronically .  On Flomax,vibegron  AKI/NAGMA:Resolved  Chronic back pain: No acute findings on the CT lumbar spine.  Showed chronic compression deformities of T12 and L1. No retropulsion.Posterior instrumented fusion from L4-S1 without evidence of hardware complication. On lidocaine patch.  On oxycodone.  Dementia: CT head did not show any acute findings but showed generalized atrophy and findings of chronic microvascular disease.  Currently alert and oriented.  Continue deleirium precaution, frequent reorientation  Anxiety/restless leg syndrome: On Effexor, Requip  Hypertension: On diltiazem, enalapril.  ARB on hold due to AKI  Debility/Deconditioning: PT/OT consulted.  SNF recommended at discharge         DVT prophylaxis:SCDs Start: 04/19/23 2245     Code Status: Full Code  Family Communication: Brother on phone on 3/14  Patient status:Inpatient  Patient is from :home  Anticipated discharge to:SNF  Estimated DC date:not sure   Consultants: Urology, general surgery  Procedures:  right-sided ureteral placement  Antimicrobials:  Anti-infectives (From admission, onward)    Start  Dose/Rate Route Frequency Ordered Stop   04/23/23 1115  piperacillin-tazobactam (ZOSYN) IVPB 3.375 g         3.375 g 12.5 mL/hr over 240 Minutes Intravenous Every 8 hours 04/23/23 1016     04/21/23 1330  ceFEPIme (MAXIPIME) 2 g in sodium chloride 0.9 % 100 mL IVPB  Status:  Discontinued        2 g 200 mL/hr over 30 Minutes Intravenous Every 12 hours 04/21/23 1242 04/23/23 0926   04/20/23 1800  cefTRIAXone (ROCEPHIN) 1 g in sodium chloride 0.9 % 100 mL IVPB  Status:  Discontinued        1 g 200 mL/hr over 30 Minutes Intravenous Every 24 hours 04/19/23 2244 04/21/23 1231   04/19/23 1815  cefTRIAXone (ROCEPHIN) 1 g in sodium chloride 0.9 % 100 mL IVPB        1 g 200 mL/hr over 30 Minutes Intravenous  Once 04/19/23 1806 04/19/23 2015       Subjective: Patient seen and examined at bedside today.  He feels same Like yesterday.  Abdomen still distended.  He said he is passing gas.  Soft bowel sounds could be heard.  No nausea or vomiting.  Objective: Vitals:   04/22/23 2211 04/22/23 2222 04/23/23 0145 04/23/23 0511  BP: 120/73  135/76 136/86  Pulse: 65  (!) 111 (!) 106  Resp: 15  16 15   Temp: (!) 97.5 F (36.4 C)  98.5 F (36.9 C) 98.6 F (37 C)  TempSrc: Oral  Oral Oral  SpO2: 97% 95% 96% 96%  Weight:      Height:        Intake/Output Summary (Last 24 hours) at 04/23/2023 1201 Last data filed at 04/23/2023 0901 Gross per 24 hour  Intake 2713.91 ml  Output 2300 ml  Net 413.91 ml   Filed Weights   04/19/23 1457  Weight: 81.6 kg    Examination:  General exam: in mild to moderate distress due to abdominal pain HEENT: PERRL Respiratory system:  no wheezes or crackles  Cardiovascular system: S1 & S2 heard, RRR.  Gastrointestinal system: Abdomen is distended, generalized tenderness, very slow bowel sounds heard Central nervous system: Alert and oriented Extremities: No edema, no clubbing ,no cyanosis Skin: No rashes, no ulcers,no icterus  GU: Foley catheter   Data Reviewed: I have personally reviewed following labs and imaging studies  CBC: Recent Labs  Lab 04/19/23 1501  04/20/23 0455 04/21/23 0525 04/23/23 0439  WBC 10.9* 9.5 10.5 19.4*  NEUTROABS 9.7*  --   --   --   HGB 11.5* 10.7* 9.3* 9.5*  HCT 35.9* 34.5* 28.9* 29.7*  MCV 97.3 99.4 96.3 96.1  PLT 288 320 309 409*   Basic Metabolic Panel: Recent Labs  Lab 04/19/23 1501 04/20/23 0450 04/21/23 0525 04/22/23 0541 04/23/23 0439  NA 135 134* 133* 137 134*  K 4.1 4.4 3.9 3.7 3.9  CL 105 105 105 104 100  CO2 16* 19* 16* 21* 24  GLUCOSE 127* 100* 135* 127* 116*  BUN 39* 36* 40* 35* 34*  CREATININE 1.88* 1.75* 1.41* 1.21 1.20  CALCIUM 10.0 9.3 9.0 9.1 8.8*     Recent Results (from the past 240 hours)  Urine Culture     Status: Abnormal   Collection Time: 04/19/23  4:23 PM   Specimen: Urine, Random  Result Value Ref Range Status   Specimen Description   Final    URINE, RANDOM Performed at [facility], [address] [place], [place] [address]  Special Requests   Final    NONE Reflexed from [id number] Performed at [facility], [address] [place], [place] [address]    Culture 20,000 COLONIES/mL ENTEROBACTER CLOACAE (A)  Final   Report Status 04/21/2023 FINAL  Final   Organism ID, Bacteria ENTEROBACTER CLOACAE (A)  Final      Susceptibility   Enterobacter cloacae - MIC*    CEFEPIME 1 SENSITIVE Sensitive     CIPROFLOXACIN 1 RESISTANT Resistant     GENTAMICIN <=1 SENSITIVE Sensitive     IMIPENEM 1 SENSITIVE Sensitive     NITROFURANTOIN 128 RESISTANT Resistant     TRIMETH/SULFA <=20 SENSITIVE Sensitive     PIP/TAZO 64 INTERMEDIATE Intermediate ug/mL    * 20,000 COLONIES/mL ENTEROBACTER CLOACAE  Culture, blood (Routine X 2) w Reflex to ID Panel     Status: None (Preliminary result)   Collection Time: 04/20/23  8:23 AM   Specimen: BLOOD RIGHT HAND  Result Value Ref Range Status   Specimen Description   Final    BLOOD RIGHT HAND Performed at [facility], [address], [place], [place] [address]    Special Requests   Final    BOTTLES DRAWN AEROBIC ONLY Blood Culture  results may not be optimal due to an inadequate volume of blood received in culture bottles Performed at [facility], [address], [place], [place] [address]    Culture   Final    NO GROWTH 3 DAYS Performed at [facility], [address] [place], [place] [address]    Report Status PENDING  Incomplete  Culture, blood (Routine X 2) w Reflex to ID Panel     Status: None (Preliminary result)   Collection Time: 04/20/23  8:23 AM   Specimen: BLOOD RIGHT ARM  Result Value Ref Range Status   Specimen Description   Final    BLOOD RIGHT ARM Performed at [facility], [address] [place], [place] [address]    Special Requests   Final    BOTTLES DRAWN AEROBIC ONLY Blood Culture results may not be optimal due to an inadequate volume of blood received in culture bottles Performed at [facility], [address] [place], [place] [address]    Culture   Final    NO GROWTH 3 DAYS Performed at [facility], [address] [place], [place] [address]    Report Status PENDING  Incomplete     Radiology Studies: DG Abd Portable 1V Result Date: 04/23/2023 CLINICAL DATA:  Ileus. EXAM: PORTABLE ABDOMEN - 1 VIEW COMPARISON:  January 17, 2022. FINDINGS: Right-sided ureteral stent is noted in grossly good position. Mildly dilated small bowel loops are noted. No definite colonic dilatation is noted. No definite contrast is noted in the colon. IMPRESSION: Mildly dilated small bowel loops are noted concerning for ileus or possibly distal small bowel obstruction. Electronically Signed   By: Lupita Raider M.D.   On: 04/23/2023 09:31   CT ABDOMEN PELVIS WO CONTRAST Result Date: 04/22/2023 CLINICAL DATA:  Abdominal pain. EXAM: CT ABDOMEN AND PELVIS WITHOUT CONTRAST TECHNIQUE: Multidetector CT imaging of the abdomen and pelvis was performed following the standard protocol without IV contrast. RADIATION DOSE REDUCTION: This exam was performed according  to the departmental dose-optimization program which includes automated exposure control, adjustment of the mA and/or kV according to patient size and/or use of iterative reconstruction technique. COMPARISON:  CT abdomen pelvis dated 04/19/2023. FINDINGS: Evaluation of this exam is  limited due to respiratory motion as well as in the absence of intravenous contrast. Lower chest: Partially visualized small right pleural effusion and partial compressive atelectasis of the right lower lobe. Pneumonia is not excluded. Minimal left lung base linear atelectasis. There is coronary vascular calcification. No intra-abdominal free air or free fluid. Hepatobiliary: The liver is unremarkable. No biliary dilatation. Layering sludge noted in the gallbladder. No pericholecystic fluid. No calcified gallstone. Pancreas: Unremarkable. No pancreatic ductal dilatation or surrounding inflammatory changes. Spleen: Normal in size without focal abnormality. Adrenals/Urinary Tract: The right adrenal glands unremarkable. Indeterminate 1 cm left adrenal nodule. Small left renal interpolar cyst. There is a 5 mm nonobstructing left renal inferior pole calculus. There has been interval placement of a right pigtail ureteral stent with proximal tip in the right renal collecting system and distal end in the urinary bladder. Near complete resolution of the previously seen right hydronephrosis. Multiple nonobstructing right renal inferior pole calculi with combined length of approximately 1 cm. There has been interval development of moderate amount of high attenuating content within the large right renal cyst consistent with hemorrhage. Small pockets of air also noted within this collection there is right perinephric stranding and possible small hematoma. Relatively similar location of a 9 mm distal right ureteral calculus or the pelvic brim. The additional nonobstructing smaller stone in the distal right ureter is no longer visualized and likely past  into the bladder. The urinary bladder is minimally distended. Similar appearance of a cluster of small stones along the posterior bladder wall. A Foley catheter noted in the bladder. Tiny pocket of air within the urinary bladder, likely introduced via catheter. Stomach/Bowel: Postsurgical changes of bowel with anastomotic staple line in the lower abdomen. Mildly dilated small bowel loops measure up to 3.3 cm in caliber. The terminal ileum and distal small bowel in the right lower quadrant are collapsed. Although a transition appears to be present in the right lower quadrant, findings may represent an ileus or developing obstruction. Follow-up with small-bowel series recommended to document passage of contrast into the colon. Vascular/Lymphatic: Moderate aortoiliac atherosclerotic disease. The IVC is unremarkable. No portal venous gas. There is no adenopathy. Reproductive: Enlarged prostate gland with median lobe hypertrophy indenting with bladder. Other: Subcutaneous edema of the right lateral abdominal wall. Musculoskeletal: Osteopenia with degenerative changes of the spine. Lower lumbar posterior fusion. No acute osseous pathology. IMPRESSION: 1. Interval placement of a right pigtail ureteral stent with near complete resolution of the previously seen right hydronephrosis. 2. Interval development of moderate amount of hemorrhage within the large right renal cyst. 3. Relatively similar location of a 9 mm distal right ureteral calculus or the pelvic brim. 4. Ileus versus developing small-bowel obstruction. Follow-up with small-bowel series recommended. Electronically Signed   By: Elgie Collard M.D.   On: 04/22/2023 14:14    Scheduled Meds:  Chlorhexidine Gluconate Cloth  6 each Topical Daily   diatrizoate meglumine-sodium  90 mL Per NG tube Once   diltiazem  180 mg Oral Daily   docusate sodium  300 mg Oral QHS   mirabegron ER  50 mg Oral QHS   polyethylene glycol  17 g Oral BID   senna-docusate  1 tablet  Oral BID   tamsulosin  0.4 mg Oral QPM   venlafaxine XR  75 mg Oral Q breakfast   Continuous Infusions:  sodium chloride 50 mL/hr at 04/23/23 0942   piperacillin-tazobactam (ZOSYN)  IV 3.375 g (04/23/23 1041)     LOS: 4 days  Burnadette Pop, MD [facility] P3/14/2025, 12:01 PM

## 2023-04-23 NOTE — Plan of Care (Signed)
   Problem: Education: Goal: Knowledge of General Education information will improve Description Including pain rating scale, medication(s)/side effects and non-pharmacologic comfort measures Outcome: Progressing   Problem: Health Behavior/Discharge Planning: Goal: Ability to manage health-related needs will improve Outcome: Progressing

## 2023-04-23 NOTE — Progress Notes (Signed)
 Primary RN and RN Rosey Bath attempted NG tube insertion twice without success, RN made MD aware of situation.

## 2023-04-24 ENCOUNTER — Inpatient Hospital Stay (HOSPITAL_COMMUNITY)

## 2023-04-24 DIAGNOSIS — N12 Tubulo-interstitial nephritis, not specified as acute or chronic: Secondary | ICD-10-CM | POA: Diagnosis not present

## 2023-04-24 LAB — BASIC METABOLIC PANEL
Anion gap: 11 (ref 5–15)
BUN: 34 mg/dL — ABNORMAL HIGH (ref 8–23)
CO2: 25 mmol/L (ref 22–32)
Calcium: 9.1 mg/dL (ref 8.9–10.3)
Chloride: 104 mmol/L (ref 98–111)
Creatinine, Ser: 1.23 mg/dL (ref 0.61–1.24)
GFR, Estimated: >60 mL/min (ref >60)
Glucose, Bld: 120 mg/dL — ABNORMAL HIGH (ref 70–99)
Potassium: 3.8 mmol/L (ref 3.5–5.1)
Sodium: 140 mmol/L (ref 135–145)

## 2023-04-24 LAB — CBC
HCT: 31.4 % — ABNORMAL LOW (ref 39.0–52.0)
Hemoglobin: 9.9 g/dL — ABNORMAL LOW (ref 13.0–17.0)
MCH: 31.3 pg (ref 26.0–34.0)
MCHC: 31.5 g/dL (ref 30.0–36.0)
MCV: 99.4 fL (ref 80.0–100.0)
Platelets: 404 10*3/uL — ABNORMAL HIGH (ref 150–400)
RBC: 3.16 MIL/uL — ABNORMAL LOW (ref 4.22–5.81)
RDW: 13.6 % (ref 11.5–15.5)
WBC: 20.9 10*3/uL — ABNORMAL HIGH (ref 4.0–10.5)
nRBC: 0 % (ref 0.0–0.2)

## 2023-04-24 LAB — PHOSPHORUS: Phosphorus: 3.3 mg/dL (ref 2.5–4.6)

## 2023-04-24 LAB — MAGNESIUM: Magnesium: 2.2 mg/dL (ref 1.7–2.4)

## 2023-04-24 MED ORDER — METHOCARBAMOL 1000 MG/10ML IJ SOLN
1000.0000 mg | Freq: Four times a day (QID) | INTRAMUSCULAR | Status: DC | PRN
  Administered 2023-04-24 – 2023-04-28 (×7): 1000 mg via INTRAVENOUS
  Filled 2023-04-24 (×7): qty 10

## 2023-04-24 MED ORDER — SODIUM CHLORIDE 0.9 % IV SOLN
1.0000 g | Freq: Three times a day (TID) | INTRAVENOUS | Status: DC
  Administered 2023-04-24 – 2023-04-27 (×9): 1 g via INTRAVENOUS
  Filled 2023-04-24 (×10): qty 20

## 2023-04-24 MED ORDER — METHOCARBAMOL 500 MG PO TABS
1000.0000 mg | ORAL_TABLET | Freq: Four times a day (QID) | ORAL | Status: DC | PRN
  Administered 2023-04-26 – 2023-04-29 (×3): 1000 mg
  Filled 2023-04-24 (×3): qty 2

## 2023-04-24 MED ORDER — BISACODYL 10 MG RE SUPP: 10.0000 mg | Freq: Every day | RECTAL | Status: DC | PRN

## 2023-04-24 NOTE — Progress Notes (Addendum)
 4 Days Post-Op  Subjective: CC: He states he feels "like a million bucks" today.  Does endorse some flatus.  Still having right sided abdominal pain.  Objective: Vital signs in last 24 hours: Temp:  [98.1 F (36.7 C)-98.4 F (36.9 C)] 98.4 F (36.9 C) (03/15 0554) Pulse Rate:  [94-110] 94 (03/15 0554) Resp:  [16] 16 (03/15 0554) BP: (120-136)/(77-78) 127/78 (03/15 0554) SpO2:  [92 %-96 %] 96 % (03/15 0554) Last BM Date : 04/18/23  Intake/Output from previous day: 03/14 0701 - 03/15 0700 In: 1427.4 [I.V.:1327; IV Piggyback:100.4] Out: 4425 [Urine:1625; Emesis/NG output:2800] Intake/Output this shift: No intake/output data recorded.  PE: Gen:  Alert, NAD, pleasant Abd: Remains distended but improved compared to yesterday.  Mildly tender along right hemiabdomen-no guarding or peritoneal signs.  NG output bilious, 2800 recorded.  Lab Results:  Recent Labs    04/23/23 0439 04/24/23 0540  WBC 19.4* 20.9*  HGB 9.5* 9.9*  HCT 29.7* 31.4*  PLT 409* 404*   BMET Recent Labs    04/23/23 0439 04/24/23 0540  NA 134* 140  K 3.9 3.8  CL 100 104  CO2 24 25  GLUCOSE 116* 120*  BUN 34* 34*  CREATININE 1.20 1.23  CALCIUM 8.8* 9.1   PT/INR No results for input(s): "LABPROT", "INR" in the last 72 hours. CMP     Component Value Date/Time   NA 140 04/24/2023 0540   K 3.8 04/24/2023 0540   CL 104 04/24/2023 0540   CO2 25 04/24/2023 0540   GLUCOSE 120 (H) 04/24/2023 0540   BUN 34 (H) 04/24/2023 0540   CREATININE 1.23 04/24/2023 0540   CALCIUM 9.1 04/24/2023 0540   PROT 6.7 04/20/2023 0450   ALBUMIN 2.7 (L) 04/20/2023 0450   AST 18 04/20/2023 0450   ALT 20 04/20/2023 0450   ALKPHOS 83 04/20/2023 0450   BILITOT 0.7 04/20/2023 0450   GFRNONAA >60 04/24/2023 0540   GFRAA >60 04/26/2019 1721   Lipase     Component Value Date/Time   LIPASE 34 06/17/2022 1323    Studies/Results: DG Abd Portable 1V-Small Bowel Obstruction Protocol-initial, 8 hr delay Result Date:  04/23/2023 CLINICAL DATA:  8 hour small-bowel delay EXAM: PORTABLE ABDOMEN - 1 VIEW COMPARISON:  04/23/2023, CT 04/22/2023 FINDINGS: Enteric tube tip overlies the duodenal bulb region. Enteral contrast collection at the gastric fundus. Persistent small bowel distension. Dilute contrast within dilated small bowel in the left lower quadrant. No colon contrast. Right-sided ureteral stent. IMPRESSION: Persistent small bowel distension with contrast in the gastric fundus and dilute contrast within dilated small bowel in the left lower quadrant. No colon contrast. Electronically Signed   By: Jasmine Pang M.D.   On: 04/23/2023 23:57   DG Abd 1 View Result Date: 04/23/2023 CLINICAL DATA:  Small bowel obstruction EXAM: ABDOMEN - 1 VIEW COMPARISON:  Earlier today FINDINGS: Tip and side port of the enteric tube below the diaphragm in the stomach. Gaseous central small bowel dilatation has air-fluid levels on this upright view. Right ureteral stent remains in place. IMPRESSION: 1. Gaseous central small bowel dilatation with air-fluid levels on this upright view. This may represent ileus or obstruction 2. Tip and side port of the enteric tube below the diaphragm in the stomach. Electronically Signed   By: Narda Rutherford M.D.   On: 04/23/2023 14:51   DG Abd Portable 1V-Small Bowel Protocol-Position Verification Result Date: 04/23/2023 CLINICAL DATA:  Nasogastric tube placement. EXAM: PORTABLE ABDOMEN - 1 VIEW COMPARISON:  Radiograph earlier today.  FINDINGS: Tip and side port of the enteric tube below the diaphragm in the stomach. Moderate diffuse gaseous small bowel distension centrally. Moderate volume of stool in the colon. Right ureteral stent in place. Catheter projects over the pelvis. IMPRESSION: 1. Tip and side port of the enteric tube below the diaphragm in the stomach. 2. Moderate diffuse gaseous small bowel distension centrally, may represent ileus or obstruction. This is similar to exam earlier today.  Electronically Signed   By: Narda Rutherford M.D.   On: 04/23/2023 14:50   DG Abd Portable 1V Result Date: 04/23/2023 CLINICAL DATA:  Ileus. EXAM: PORTABLE ABDOMEN - 1 VIEW COMPARISON:  January 17, 2022. FINDINGS: Right-sided ureteral stent is noted in grossly good position. Mildly dilated small bowel loops are noted. No definite colonic dilatation is noted. No definite contrast is noted in the colon. IMPRESSION: Mildly dilated small bowel loops are noted concerning for ileus or possibly distal small bowel obstruction. Electronically Signed   By: Lupita Raider M.D.   On: 04/23/2023 09:31   CT ABDOMEN PELVIS WO CONTRAST Result Date: 04/22/2023 CLINICAL DATA:  Abdominal pain. EXAM: CT ABDOMEN AND PELVIS WITHOUT CONTRAST TECHNIQUE: Multidetector CT imaging of the abdomen and pelvis was performed following the standard protocol without IV contrast. RADIATION DOSE REDUCTION: This exam was performed according to the departmental dose-optimization program which includes automated exposure control, adjustment of the mA and/or kV according to patient size and/or use of iterative reconstruction technique. COMPARISON:  CT abdomen pelvis dated 04/19/2023. FINDINGS: Evaluation of this exam is limited due to respiratory motion as well as in the absence of intravenous contrast. Lower chest: Partially visualized small right pleural effusion and partial compressive atelectasis of the right lower lobe. Pneumonia is not excluded. Minimal left lung base linear atelectasis. There is coronary vascular calcification. No intra-abdominal free air or free fluid. Hepatobiliary: The liver is unremarkable. No biliary dilatation. Layering sludge noted in the gallbladder. No pericholecystic fluid. No calcified gallstone. Pancreas: Unremarkable. No pancreatic ductal dilatation or surrounding inflammatory changes. Spleen: Normal in size without focal abnormality. Adrenals/Urinary Tract: The right adrenal glands unremarkable. Indeterminate 1  cm left adrenal nodule. Small left renal interpolar cyst. There is a 5 mm nonobstructing left renal inferior pole calculus. There has been interval placement of a right pigtail ureteral stent with proximal tip in the right renal collecting system and distal end in the urinary bladder. Near complete resolution of the previously seen right hydronephrosis. Multiple nonobstructing right renal inferior pole calculi with combined length of approximately 1 cm. There has been interval development of moderate amount of high attenuating content within the large right renal cyst consistent with hemorrhage. Small pockets of air also noted within this collection there is right perinephric stranding and possible small hematoma. Relatively similar location of a 9 mm distal right ureteral calculus or the pelvic brim. The additional nonobstructing smaller stone in the distal right ureter is no longer visualized and likely past into the bladder. The urinary bladder is minimally distended. Similar appearance of a cluster of small stones along the posterior bladder wall. A Foley catheter noted in the bladder. Tiny pocket of air within the urinary bladder, likely introduced via catheter. Stomach/Bowel: Postsurgical changes of bowel with anastomotic staple line in the lower abdomen. Mildly dilated small bowel loops measure up to 3.3 cm in caliber. The terminal ileum and distal small bowel in the right lower quadrant are collapsed. Although a transition appears to be present in the right lower quadrant, findings may represent  an ileus or developing obstruction. Follow-up with small-bowel series recommended to document passage of contrast into the colon. Vascular/Lymphatic: Moderate aortoiliac atherosclerotic disease. The IVC is unremarkable. No portal venous gas. There is no adenopathy. Reproductive: Enlarged prostate gland with median lobe hypertrophy indenting with bladder. Other: Subcutaneous edema of the right lateral abdominal wall.  Musculoskeletal: Osteopenia with degenerative changes of the spine. Lower lumbar posterior fusion. No acute osseous pathology. IMPRESSION: 1. Interval placement of a right pigtail ureteral stent with near complete resolution of the previously seen right hydronephrosis. 2. Interval development of moderate amount of hemorrhage within the large right renal cyst. 3. Relatively similar location of a 9 mm distal right ureteral calculus or the pelvic brim. 4. Ileus versus developing small-bowel obstruction. Follow-up with small-bowel series recommended. Electronically Signed   By: Elgie Collard M.D.   On: 04/22/2023 14:14    Anti-infectives: Anti-infectives (From admission, onward)    Start     Dose/Rate Route Frequency Ordered Stop   04/23/23 1115  piperacillin-tazobactam (ZOSYN) IVPB 3.375 g        3.375 g 12.5 mL/hr over 240 Minutes Intravenous Every 8 hours 04/23/23 1016     04/21/23 1330  ceFEPIme (MAXIPIME) 2 g in sodium chloride 0.9 % 100 mL IVPB  Status:  Discontinued        2 g 200 mL/hr over 30 Minutes Intravenous Every 12 hours 04/21/23 1242 04/23/23 0926   04/20/23 1800  cefTRIAXone (ROCEPHIN) 1 g in sodium chloride 0.9 % 100 mL IVPB  Status:  Discontinued        1 g 200 mL/hr over 30 Minutes Intravenous Every 24 hours 04/19/23 2244 04/21/23 1231   04/19/23 1815  cefTRIAXone (ROCEPHIN) 1 g in sodium chloride 0.9 % 100 mL IVPB        1 g 200 mL/hr over 30 Minutes Intravenous  Once 04/19/23 1806 04/19/23 2015        Assessment/Plan Ileus vs possible sbo - CT 3/13 w/ mildly dilated small bowel loops w/ possible transition in the RLQ w/ collapsed distal small bowel in the RLQ. Hx of ex lap and SBR in 01/2022 by Dr. Robyne Peers for SBO.  - Given his current situation with his recent stent placement (3/11), infection, and now renal hemorrhage, it is more likely the patient has a reactive ileus secondary to these issues.  However, he has had prior abdominal surgery and could be developing  a SBO secondary to adhesive disease.  - Keep K >=4, Phos >= 3, Mg >= 2 and mobilize for bowel function -Protocol films yesterday evening with persistent small bowel distention and contrast in the fundus.  Given overall history, we will continue NG decompression for today and potentially repeat the protocol in 24 hours if no clinical improvement/bowel function.  Hopefully patient will improve with conservative management. If patient fails to improve with conservative management, they may require exploratory surgery during admission.  -Would consider transitioning meds to IV as able as he is probably not absorbing most of these right now -Surgery will continue to follow  FEN - NPO, NGT to LIWS, IVF  VTE - SCDs, per primary ID - Cefepime for UTI/Pyelonephritis. Afebrile. HR 106. No hypotension. WBC 20.9 (uptrending) Foley - Per urology.    I reviewed nursing notes, Consultant (Urology) notes, hospitalist notes, last 24 h vitals and pain scores, last 48 h intake and output, last 24 h labs and trends, and last 24 h imaging results.   LOS: 5 days  Berna Bue, MD [facility] Surgery 04/24/2023, 7:24 AM Please see Amion for pager number during day hours 7:00am-4:30pm

## 2023-04-24 NOTE — Hospital Course (Signed)
 73 y.o. M with dementia, lives at home, neck pain s/p spinal cord stimulator, on daily opiates, HLD, neurogenic bladder with indwelling foley, and HTN who presented with flank pain, found to have nephrolithiasis.  Hospital course complicated by SBO.   3/10: P/w flank pain, CT showed right hydronephrosis and stone; Urology consulted, admitted from [facility] 3/11: Cystoscopy, pyelogram, right stent insertion by Dr. Lafonda Mosses 3/12: Persistent nausea, developed abdominal distension 3/13: CT shows new SBO; Gen Surg consulted 3/14: Vomiting worse, NG placed 3/15: Small bowel series shows no contrast yet to colon

## 2023-04-24 NOTE — Progress Notes (Signed)
  Progress Note   Patient: Luke Foster YQM:[id number] DOB: 27-Jul-1950 DOA: 04/19/2023     5 DOS: the patient was seen and examined on 04/24/2023        Brief hospital course: 73 y.o. M with dementia, lives at home, neck pain s/p spinal cord stimulator, on daily opiates, HLD, neurogenic bladder with indwelling foley, and HTN who presented with flank pain, found to have nephrolithiasis.  Hospital course complicated by SBO.   3/10: P/w flank pain, CT showed right hydronephrosis and stone; Urology consulted, admitted from [facility] 3/11: Cystoscopy, pyelogram, right stent insertion by Dr. Lafonda Mosses 3/12: Persistent nausea, developed abdominal distension 3/13: CT shows new SBO; Gen Surg consulted 3/14: Vomiting worse, NG placed 3/15: Small bowel series shows no contrast yet to colon     Assessment and Plan: Right-sided hydronephrosis due to nephrolithiasis Pyelonephritis due to nephrolithiasis Urine culture from admission growing Enterobacter.    Admitted and Urology performed cystoscopy and stent placement.  Doing well from that standpoint post op - Continue Merrem - Outpatient Urology follow up   Small bowel obstruction Does have a history of bowel obstructions in the past requiring laparotomy. This developed after placement of urinary stent  NG tube in place, no flatus or bowel movement.  Abdomen still distended. - Continue x-ray series - Consult general surgery, appreciate cares - Continue IV fluids   Chronic back pain - Continue Robaxin, as needed - Continue PRN hydromorphone - Resume home oxycodone 15 mg when able to take PO - Resume venlafaxine, trazodone when able to take PO  Chronic urinary retention - Continue home chronic indwelling foley - Resume home mirabegron, Flomax, Vesicare when able to take PO  Dementia Brother is primary caretaker  Hypertension Blood pressure controlled - Hold diltiazem and enalapril, resume when able to take PO  Acute kidney  injury Metabolic acidosis Resolved  Anemia Unclear cause, no bleeding reported or observed - Outpatient follow-up      Subjective: No fever, no change in mentation, no respiratory symptoms.  No flatus or BM yet, still distended.     Physical Exam: BP 127/70 (BP Location: Right Arm)   Pulse 84   Temp 98.1 F (36.7 C) (Oral)   Resp 16   Ht 5\' 8"  (1.727 m)   Wt 81.6 kg   SpO2 96%   BMI 27.37 kg/m   Elderly adult male, lying in bed, NG tube in place, appears weak and tired, mostly inattentive due to dementia RRR, no murmurs, no peripheral edema Respiratory rate normal, lungs clear without rales or wheezes Abdomen distended, still some soreness on the right, otherwise no guarding or rebound Mostly and attentive to questions, but does make eye contact, responds to questions, but appears to have memory loss, he has generalized weakness, symmetric strength, speech fluent    Data Reviewed: Urine culture reviewed Comprehensive metabolic panel unremarkable CBC shows leukocytosis, white count up to 20.9  Family Communication: Brother at the bedside    Disposition: Status is: Inpatient The patient was admitted with initially a kidney stone and pyelonephritis.  He underwent stent and this issue is resolved, however in the meantime he has developed a bowel obstruction  General surgery are following, hopefully we can avoid laparotomy. May take a few days for SBO to open up.        Author: Alberteen Sam, MD 04/24/2023 3:22 PM  For on call review [email or web address].

## 2023-04-24 NOTE — Plan of Care (Signed)
   Problem: Education: Goal: Knowledge of General Education information will improve Description Including pain rating scale, medication(s)/side effects and non-pharmacologic comfort measures Outcome: Progressing   Problem: Health Behavior/Discharge Planning: Goal: Ability to manage health-related needs will improve Outcome: Progressing

## 2023-04-24 NOTE — Progress Notes (Signed)
 Occupational Therapy Treatment Patient Details Name: Luke Foster MRN: [id number] DOB: 02-Mar-1950 Today's Date: 04/24/2023   History of present illness 73 year old male who presented from home with c/o worsening back pain; had fall ~ 1 wk prior to adm. CT head negative.  CT abdomen/pelvi showed moderate right hydronephrosis, hydroureter, 9 mm obstructing stone in the distal right ureter, additional 6 mm stone, possible pyelonephritis. Urology consulted, s/p right ureteral stent on 3/11.  PMH: chronic back pain, chronic Foley catheter for urinary retention, nephrolithiasis, hypertension, depression, dementia .   OT comments  Pt. Seen for skilled OT treatment session with brother present.  Pt. Making great gains with mobility and activity tolerance today.  Able to complete bed mobility in/eob with MOD A.  Pt. Using BUEs on rails to assist with pulling, repositioning, and scooting into desired position in bed.  Able to sit eob un supported for self feeding of ice chips and wiping nose several times.  Pt. And brother Very pleased with his progress today.  Cont. With acute OT POC with progressing ADLs and mobility as pt. Able.        If plan is discharge home, recommend the following:  Two people to help with walking and/or transfers;Assistance with cooking/housework;Assist for transportation;Supervision due to cognitive status;A lot of help with bathing/dressing/bathroom;A lot of help with walking and/or transfers   Equipment Recommendations       Recommendations for Other Services      Precautions / Restrictions Precautions Precautions: Fall Precaution/Restrictions Comments: Pain to any touch on RT flank. Keep gait belt high under arms       Mobility Bed Mobility Overal bed mobility: Needs Assistance Bed Mobility: Rolling, Sidelying to Sit, Sit to Supine Rolling: Mod assist Sidelying to sit: Mod assist Supine to sit: Used rails, HOB elevated, Mod assist Sit to supine: Mod assist    General bed mobility comments: partial roll with pt. reaching with BUE to R bed rail to pull trunk while transtitioning into sitting with max a for RLE, able to assist and move LLE. initial trunk support but once in sitting able to scoot to eob and sit. for back to bed assisted with BLEs back into bed. pt. able to use BUEs and push into bed and scoot towards hob and also move hips to sit more upright.  eduction on same tech. can be used throghout the day for pressure relief.    Transfers                         Balance                                           ADL either performed or assessed with clinical judgement   ADL Overall ADL's : Needs assistance/impaired     Grooming: Sitting;Set up;Contact guard assist Grooming Details (indicate cue type and reason): able to feed himself ice chips in un supported sitting along with blowing and wiping nose not lob noted                               General ADL Comments: had pt. reach for desired items while it sitting to promote sitting balance and trunk control. pt. did well and had not seated lob    Extremity/Trunk Assessment  Vision       Perception     Praxis     Communication     Cognition Arousal: Alert Behavior During Therapy: WFL for tasks assessed/performed                                          Cueing      Exercises      Shoulder Instructions       General Comments      Pertinent Vitals/ Pain       Pain Assessment Pain Assessment: 0-10 Pain Score: 6  Pain Location: R flank Pain Descriptors / Indicators: Aching Pain Intervention(s): Limited activity within patient's tolerance, Monitored during session, Repositioned, Patient requesting pain meds-RN notified  Home Living                                          Prior Functioning/Environment              Frequency  Min 1X/week        Progress Toward  Goals  OT Goals(current goals can now be found in the care plan section)  Progress towards OT goals: Progressing toward goals     Plan      Co-evaluation                 AM-PAC OT "6 Clicks" Daily Activity     Outcome Measure   Help from another person eating meals?: A Little Help from another person taking care of personal grooming?: A Little Help from another person toileting, which includes using toliet, bedpan, or urinal?: A Lot Help from another person bathing (including washing, rinsing, drying)?: A Lot Help from another person to put on and taking off regular upper body clothing?: A Lot Help from another person to put on and taking off regular lower body clothing?: Total 6 Click Score: 13    End of Session    OT Visit Diagnosis: Unsteadiness on feet (R26.81);Pain;History of falling (Z91.81);Muscle weakness (generalized) (M62.81);Other symptoms and signs involving cognitive function Pain - Right/Left: Right   Activity Tolerance Patient tolerated treatment well   Patient Left in bed;with call bell/phone within reach;with bed alarm set;with family/visitor present   Nurse Communication Other (comment) (rn states ok to work with pt. updated with session details via secure chat and that pt. requesting pain meds and ng tubing and iv tubing need to be untangled)        Time: 7846-9629 OT Time Calculation (min): 22 min  Charges: OT Treatments $Self Care/Home Management : 8-22 mins  Boneta Lucks, COTA/L Acute Rehabilitation [phone]   Alessandra Bevels Lorraine-COTA/L 04/24/2023, 9:28 AM

## 2023-04-25 DIAGNOSIS — N12 Tubulo-interstitial nephritis, not specified as acute or chronic: Secondary | ICD-10-CM | POA: Diagnosis not present

## 2023-04-25 LAB — BASIC METABOLIC PANEL
Anion gap: 9 (ref 5–15)
BUN: 35 mg/dL — ABNORMAL HIGH (ref 8–23)
CO2: 25 mmol/L (ref 22–32)
Calcium: 8.7 mg/dL — ABNORMAL LOW (ref 8.9–10.3)
Chloride: 108 mmol/L (ref 98–111)
Creatinine, Ser: 1.04 mg/dL (ref 0.61–1.24)
GFR, Estimated: >60 mL/min (ref >60)
Glucose, Bld: 109 mg/dL — ABNORMAL HIGH (ref 70–99)
Potassium: 3.6 mmol/L (ref 3.5–5.1)
Sodium: 142 mmol/L (ref 135–145)

## 2023-04-25 LAB — CULTURE, BLOOD (ROUTINE X 2)
Culture: NO GROWTH
Culture: NO GROWTH

## 2023-04-25 LAB — CBC
HCT: 27.8 % — ABNORMAL LOW (ref 39.0–52.0)
Hemoglobin: 8.4 g/dL — ABNORMAL LOW (ref 13.0–17.0)
MCH: 30.2 pg (ref 26.0–34.0)
MCHC: 30.2 g/dL (ref 30.0–36.0)
MCV: 100 fL (ref 80.0–100.0)
Platelets: 453 10*3/uL — ABNORMAL HIGH (ref 150–400)
RBC: 2.78 MIL/uL — ABNORMAL LOW (ref 4.22–5.81)
RDW: 13.8 % (ref 11.5–15.5)
WBC: 17 10*3/uL — ABNORMAL HIGH (ref 4.0–10.5)
nRBC: 0 % (ref 0.0–0.2)

## 2023-04-25 LAB — MAGNESIUM: Magnesium: 2.1 mg/dL (ref 1.7–2.4)

## 2023-04-25 MED ORDER — DIATRIZOATE MEGLUMINE & SODIUM 66-10 % PO SOLN
90.0000 mL | Freq: Once | ORAL | Status: AC
  Administered 2023-04-25: 90 mL via NASOGASTRIC
  Filled 2023-04-25 (×2): qty 90

## 2023-04-25 MED ORDER — POTASSIUM CHLORIDE 10 MEQ/100ML IV SOLN
10.0000 meq | INTRAVENOUS | Status: AC
  Administered 2023-04-25 (×4): 10 meq via INTRAVENOUS
  Filled 2023-04-25: qty 100

## 2023-04-25 NOTE — Plan of Care (Signed)
   Problem: Clinical Measurements: Goal: Ability to maintain clinical measurements within normal limits will improve Outcome: Progressing Goal: Will remain free from infection Outcome: Progressing Goal: Diagnostic test results will improve Outcome: Progressing   Problem: Safety: Goal: Ability to remain free from injury will improve Outcome: Progressing

## 2023-04-25 NOTE — Progress Notes (Signed)
 PROGRESS NOTE  [name] ZOX:[id number] DOB: 03/07/1950 DOA: 04/19/2023 PCP: Elfredia Nevins, MD   LOS: 6 days   Brief Narrative / Interim history: 73 year old male with dementia, lives at home, neck pain status post spinal cord stimulator, hyperlipidemia, neurogenic bladder with indwelling Foley, HTN who comes into the hospital with flank pain, found to have nephrolithiasis.  Urology was consulted, he underwent cystoscopy, pyelogram, right stent insertion by Dr. Heide Scales on 3/11, and hospital course complicated by small bowel obstruction on 3/13, general surgery was consulted  Subjective / 24h Interval events: No nausea or vomiting.  He is being bothered by the NG tube  Assesement and Plan: Principal Problem:   Pyelonephritis Active Problems:   Neuropathy   Chronic pain syndrome   Status post insertion of spinal cord stimulator   Hypertension   Degenerative disc disease, lumbar   Urinary retention   Obstructive uropathy   Anxiety   Dementia (HCC)  Principal problem Right-sided hydronephrosis, nephrolithiasis, pyelonephritis -urine cultures from admission growing Enterobacter.  Patient has been placed on meropenem, continue for now.  Will discuss with urology in the morning  Active problems Small bowel obstruction -General Surgery following, currently has an NG tube in place.  He had a bowel movement this morning, management per general surgery.  Will have contrast protocol this afternoon and repeat x-rays in the morning  Chronic back pain-unable to use oral agents now, continue IV PRNs  Chronic urinary retention-currently has a Foley, continue.  Resume home medications when able to take p.o.  Dementia-Brother is a primary caretaker  Essential hypertension-blood pressure overall stable  Acute kidney injury-resolved  Moderate hemorrhage in the right renal cyst -watch per urology  Scheduled Meds:  Chlorhexidine Gluconate Cloth  6 each Topical Daily   diatrizoate  meglumine-sodium  90 mL Per NG tube Once   polyethylene glycol  17 g Oral BID   senna-docusate  1 tablet Oral BID   Continuous Infusions:  sodium chloride 125 mL/hr at 04/25/23 0857   meropenem (MERREM) IV 1 g (04/25/23 0315)   potassium chloride 10 mEq (04/25/23 0856)   PRN Meds:.bisacodyl, hydrALAZINE, HYDROmorphone (DILAUDID) injection, hyoscyamine, iohexol, lidocaine, methocarbamol (ROBAXIN) injection **OR** methocarbamol, ondansetron **OR** ondansetron (ZOFRAN) IV, oxyCODONE, rOPINIRole  Current Outpatient Medications  Medication Instructions   acetaminophen (TYLENOL) 1,000 mg, Oral, Every 6 hours PRN   ascorbic acid (VITAMIN C) 500 mg, Oral, Daily   cyanocobalamin (VITAMIN B12) 1,000 mcg, Oral, 2 times daily   Dilt-XR 120 mg, Oral, Daily   docusate sodium (COLACE) 100 mg, Oral, 3 times daily   enalapril (VASOTEC) 5 mg, Oral, 2 times daily   lidocaine (LIDODERM) 5 % 1 patch, Transdermal, Daily PRN   mirabegron ER (MYRBETRIQ) 50 mg, Oral, Daily   oxyCODONE (ROXICODONE) 15 mg, Oral, Every 6 hours PRN   polyethylene glycol (MIRALAX / GLYCOLAX) 17 g, Oral, 2 times daily   rOPINIRole (REQUIP) 0.5 mg, At bedtime PRN   solifenacin (VESICARE) 5 mg, Daily   sulfamethoxazole-trimethoprim (BACTRIM) 400-80 MG tablet 1 tablet, Oral, Daily at bedtime   tamsulosin (FLOMAX) 0.4 mg, Oral, Every evening   venlafaxine XR (EFFEXOR-XR) 75 mg, Oral, 2 times daily   Vibegron (GEMTESA PO) 50 mg, Daily at bedtime   Vitamin D 2,000 Units, Oral, Daily    Diet Orders (From admission, onward)     Start     Ordered   04/23/23 0001  Diet NPO time specified  Diet effective midnight        04/22/23 1420  DVT prophylaxis: SCDs Start: 04/19/23 2245   Lab Results  Component Value Date   PLT 453 (H) 04/25/2023      Code Status: Full Code  Family Communication: no family at bedside  Status is: Inpatient Remains inpatient appropriate because: SBO   Level of care:  Med-Surg  Consultants:  Urology General Surgery  Objective: Vitals:   04/24/23 0554 04/24/23 1314 04/24/23 2207 04/25/23 0438  BP: 127/78 127/70 (!) 142/75 138/79  Pulse: 94 84 94 79  Resp: 16 16 17 16   Temp: 98.4 F (36.9 C) 98.1 F (36.7 C) 98.2 F (36.8 C) 98.1 F (36.7 C)  TempSrc: Oral Oral Oral Oral  SpO2: 96% 96% 98% 97%  Weight:      Height:        Intake/Output Summary (Last 24 hours) at 04/25/2023 1001 Last data filed at 04/25/2023 0535 Gross per 24 hour  Intake 2343.78 ml  Output 1725 ml  Net 618.78 ml   Wt Readings from Last 3 Encounters:  04/19/23 81.6 kg  12/28/22 90.7 kg  12/23/22 90.7 kg    Examination:  Constitutional: NAD Eyes: no scleral icterus ENMT: Mucous membranes are moist.  Neck: normal, supple Respiratory: clear to auscultation bilaterally, no wheezing, no crackles. Normal respiratory effort. No accessory muscle use.  Cardiovascular: Regular rate and rhythm, no murmurs / rubs / gallops. No LE edema.  Abdomen: non distended, no tenderness. Bowel sounds positive.  Musculoskeletal: no clubbing / cyanosis.    Data Reviewed: I have independently reviewed following labs and imaging studies   CBC Recent Labs  Lab 04/19/23 1501 04/20/23 0455 04/21/23 0525 04/23/23 0439 04/24/23 0540 04/25/23 0454  WBC 10.9* 9.5 10.5 19.4* 20.9* 17.0*  HGB 11.5* 10.7* 9.3* 9.5* 9.9* 8.4*  HCT 35.9* 34.5* 28.9* 29.7* 31.4* 27.8*  PLT 288 320 309 409* 404* 453*  MCV 97.3 99.4 96.3 96.1 99.4 100.0  MCH 31.2 30.8 31.0 30.7 31.3 30.2  MCHC 32.0 31.0 32.2 32.0 31.5 30.2  RDW 14.1 14.1 14.0 13.7 13.6 13.8  LYMPHSABS 0.5*  --   --   --   --   --   MONOABS 0.5  --   --   --   --   --   EOSABS 0.0  --   --   --   --   --   BASOSABS 0.0  --   --   --   --   --     Recent Labs  Lab 04/19/23 1501 04/20/23 0450 04/21/23 0522 04/21/23 0525 04/22/23 0541 04/23/23 0439 04/24/23 0540 04/25/23 0454  NA 135 134*  --  133* 137 134* 140 142  K 4.1 4.4  --   3.9 3.7 3.9 3.8 3.6  CL 105 105  --  105 104 100 104 108  CO2 16* 19*  --  16* 21* 24 25 25   GLUCOSE 127* 100*  --  135* 127* 116* 120* 109*  BUN 39* 36*  --  40* 35* 34* 34* 35*  CREATININE 1.88* 1.75*  --  1.41* 1.21 1.20 1.23 1.04  CALCIUM 10.0 9.3  --  9.0 9.1 8.8* 9.1 8.7*  AST 21 18  --   --   --   --   --   --   ALT 22 20  --   --   --   --   --   --   ALKPHOS 85 83  --   --   --   --   --   --  BILITOT 0.6 0.7  --   --   --   --   --   --   ALBUMIN 3.2* 2.7*  --   --   --   --   --   --   MG  --   --   --   --   --   --  2.2 2.1  LATICACIDVEN 1.7  1.9  --   --   --   --   --   --   --   TSH  --   --  0.104*  --   --   --   --   --     ------------------------------------------------------------------------------------------------------------------ No results for input(s): "CHOL", "HDL", "LDLCALC", "TRIG", "CHOLHDL", "LDLDIRECT" in the last 72 hours.  No results found for: "HGBA1C" ------------------------------------------------------------------------------------------------------------------ No results for input(s): "TSH", "T4TOTAL", "T3FREE", "THYROIDAB" in the last 72 hours.  Invalid input(s): "FREET3"  Cardiac Enzymes No results for input(s): "CKMB", "TROPONINI", "MYOGLOBIN" in the last 168 hours.  Invalid input(s): "CK" ------------------------------------------------------------------------------------------------------------------    Component Value Date/Time   BNP 31.2 06/17/2020 1255    CBG: No results for input(s): "GLUCAP" in the last 168 hours.  Recent Results (from the past 240 hours)  Urine Culture     Status: Abnormal   Collection Time: 04/19/23  4:23 PM   Specimen: Urine, Random  Result Value Ref Range Status   Specimen Description   Final    URINE, RANDOM Performed at [facility], [address] [place], [place] [address]    Special Requests   Final    NONE Reflexed from [id number] Performed at [facility], [address] [place],  [place] [address]    Culture 20,000 COLONIES/mL ENTEROBACTER CLOACAE (A)  Final   Report Status 04/21/2023 FINAL  Final   Organism ID, Bacteria ENTEROBACTER CLOACAE (A)  Final      Susceptibility   Enterobacter cloacae - MIC*    CEFEPIME 1 SENSITIVE Sensitive     CIPROFLOXACIN 1 RESISTANT Resistant     GENTAMICIN <=1 SENSITIVE Sensitive     IMIPENEM 1 SENSITIVE Sensitive     NITROFURANTOIN 128 RESISTANT Resistant     TRIMETH/SULFA <=20 SENSITIVE Sensitive     PIP/TAZO 64 INTERMEDIATE Intermediate ug/mL    * 20,000 COLONIES/mL ENTEROBACTER CLOACAE  Culture, blood (Routine X 2) w Reflex to ID Panel     Status: None   Collection Time: 04/20/23  8:23 AM   Specimen: BLOOD RIGHT HAND  Result Value Ref Range Status   Specimen Description   Final    BLOOD RIGHT HAND Performed at [facility], [address] [place], [place] [address]    Special Requests   Final    BOTTLES DRAWN AEROBIC ONLY Blood Culture results may not be optimal due to an inadequate volume of blood received in culture bottles Performed at [facility], [address], [place], [place] [address]    Culture   Final    NO GROWTH 5 DAYS Performed at [facility], [address], [place], [place] [address]    Report Status 04/25/2023 FINAL  Final  Culture, blood (Routine X 2) w Reflex to ID Panel     Status: None   Collection Time: 04/20/23  8:23 AM   Specimen: BLOOD RIGHT ARM  Result Value Ref Range Status   Specimen Description   Final    BLOOD RIGHT ARM Performed at [facility], [address]  [place], [place] [address]    Special Requests   Final    BOTTLES DRAWN AEROBIC ONLY Blood Culture results may not be optimal due to an inadequate volume of blood received in culture bottles Performed at [facility], [address] [place], [place] [address]    Culture   Final    NO GROWTH 5 DAYS Performed at [facility], [address], [place], [place]  [address]    Report Status 04/25/2023 FINAL  Final     Radiology Studies: DG Abd 1 View Result Date: 04/24/2023 CLINICAL DATA:  [id number] Small bowel obstruction (HCC) [id number] EXAM: ABDOMEN - 1 VIEW COMPARISON:  04/23/2023 FINDINGS: Enteric tube is position within the stomach. Tube appears kinked upon itself. Dilute contrast within dilated loops of small bowel. No definite contrast within the colon. No gross free intraperitoneal air on supine imaging. Right sided nephroureteral stent. IMPRESSION: 1. Dilute contrast within dilated loops of small bowel. No definite contrast within the colon. 2. Enteric tube is positioned within the stomach. Tube appears kinked upon itself. Electronically Signed   By: Duanne Guess D.O.   On: 04/24/2023 14:02     Pamella Pert, MD, PhD [facility]  Between 7 am - 7 pm I am available, please contact me via Amion (for emergencies) or Securechat (non urgent messages)  Between 7 pm - 7 am I am not available, please contact night coverage MD/APP via Amion

## 2023-04-25 NOTE — Progress Notes (Signed)
 5 Days Post-Op  Subjective: CC:  Feeling well this morning, reports the right sided pain is much better.  Had a bowel movement this morning.  Objective: Vital signs in last 24 hours: Temp:  [98.1 F (36.7 C)-98.2 F (36.8 C)] 98.1 F (36.7 C) (03/16 0438) Pulse Rate:  [79-94] 79 (03/16 0438) Resp:  [16-17] 16 (03/16 0438) BP: (127-142)/(70-79) 138/79 (03/16 0438) SpO2:  [96 %-98 %] 97 % (03/16 0438) Last BM Date : 04/25/23  Intake/Output from previous day: 03/15 0701 - 03/16 0700 In: 2343.8 [P.O.:60; I.V.:2183.8; IV Piggyback:100] Out: 2400 [Urine:1275; Emesis/NG output:1125] Intake/Output this shift: No intake/output data recorded.  PE: Gen:  Alert, NAD, pleasant Abd: Soft, distended, currently not on tender along right hemiabdomen.  NG output light bilious, 1125 recorded but overnight only about 250 and again patient had a bowel movement this morning.  Lab Results:  Recent Labs    04/24/23 0540 04/25/23 0454  WBC 20.9* 17.0*  HGB 9.9* 8.4*  HCT 31.4* 27.8*  PLT 404* 453*   BMET Recent Labs    04/24/23 0540 04/25/23 0454  NA 140 142  K 3.8 3.6  CL 104 108  CO2 25 25  GLUCOSE 120* 109*  BUN 34* 35*  CREATININE 1.23 1.04  CALCIUM 9.1 8.7*   PT/INR No results for input(s): "LABPROT", "INR" in the last 72 hours. CMP     Component Value Date/Time   NA 142 04/25/2023 0454   K 3.6 04/25/2023 0454   CL 108 04/25/2023 0454   CO2 25 04/25/2023 0454   GLUCOSE 109 (H) 04/25/2023 0454   BUN 35 (H) 04/25/2023 0454   CREATININE 1.04 04/25/2023 0454   CALCIUM 8.7 (L) 04/25/2023 0454   PROT 6.7 04/20/2023 0450   ALBUMIN 2.7 (L) 04/20/2023 0450   AST 18 04/20/2023 0450   ALT 20 04/20/2023 0450   ALKPHOS 83 04/20/2023 0450   BILITOT 0.7 04/20/2023 0450   GFRNONAA >60 04/25/2023 0454   GFRAA >60 04/26/2019 1721   Lipase     Component Value Date/Time   LIPASE 34 06/17/2022 1323    Studies/Results: DG Abd 1 View Result Date: 04/24/2023 CLINICAL  DATA:  [id number] Small bowel obstruction (HCC) [id number] EXAM: ABDOMEN - 1 VIEW COMPARISON:  04/23/2023 FINDINGS: Enteric tube is position within the stomach. Tube appears kinked upon itself. Dilute contrast within dilated loops of small bowel. No definite contrast within the colon. No gross free intraperitoneal air on supine imaging. Right sided nephroureteral stent. IMPRESSION: 1. Dilute contrast within dilated loops of small bowel. No definite contrast within the colon. 2. Enteric tube is positioned within the stomach. Tube appears kinked upon itself. Electronically Signed   By: Duanne Guess D.O.   On: 04/24/2023 14:02   DG Abd Portable 1V-Small Bowel Obstruction Protocol-initial, 8 hr delay Result Date: 04/23/2023 CLINICAL DATA:  8 hour small-bowel delay EXAM: PORTABLE ABDOMEN - 1 VIEW COMPARISON:  04/23/2023, CT 04/22/2023 FINDINGS: Enteric tube tip overlies the duodenal bulb region. Enteral contrast collection at the gastric fundus. Persistent small bowel distension. Dilute contrast within dilated small bowel in the left lower quadrant. No colon contrast. Right-sided ureteral stent. IMPRESSION: Persistent small bowel distension with contrast in the gastric fundus and dilute contrast within dilated small bowel in the left lower quadrant. No colon contrast. Electronically Signed   By: Jasmine Pang M.D.   On: 04/23/2023 23:57   DG Abd 1 View Result Date: 04/23/2023 CLINICAL DATA:  Small bowel obstruction EXAM: ABDOMEN - 1  VIEW COMPARISON:  Earlier today FINDINGS: Tip and side port of the enteric tube below the diaphragm in the stomach. Gaseous central small bowel dilatation has air-fluid levels on this upright view. Right ureteral stent remains in place. IMPRESSION: 1. Gaseous central small bowel dilatation with air-fluid levels on this upright view. This may represent ileus or obstruction 2. Tip and side port of the enteric tube below the diaphragm in the stomach. Electronically Signed   By: Narda Rutherford M.D.   On: 04/23/2023 14:51   DG Abd Portable 1V-Small Bowel Protocol-Position Verification Result Date: 04/23/2023 CLINICAL DATA:  Nasogastric tube placement. EXAM: PORTABLE ABDOMEN - 1 VIEW COMPARISON:  Radiograph earlier today. FINDINGS: Tip and side port of the enteric tube below the diaphragm in the stomach. Moderate diffuse gaseous small bowel distension centrally. Moderate volume of stool in the colon. Right ureteral stent in place. Catheter projects over the pelvis. IMPRESSION: 1. Tip and side port of the enteric tube below the diaphragm in the stomach. 2. Moderate diffuse gaseous small bowel distension centrally, may represent ileus or obstruction. This is similar to exam earlier today. Electronically Signed   By: Narda Rutherford M.D.   On: 04/23/2023 14:50    Anti-infectives: Anti-infectives (From admission, onward)    Start     Dose/Rate Route Frequency Ordered Stop   04/24/23 1130  meropenem (MERREM) 1 g in sodium chloride 0.9 % 100 mL IVPB        1 g 200 mL/hr over 30 Minutes Intravenous Every 8 hours 04/24/23 1028     04/23/23 1115  piperacillin-tazobactam (ZOSYN) IVPB 3.375 g  Status:  Discontinued        3.375 g 12.5 mL/hr over 240 Minutes Intravenous Every 8 hours 04/23/23 1016 04/24/23 1028   04/21/23 1330  ceFEPIme (MAXIPIME) 2 g in sodium chloride 0.9 % 100 mL IVPB  Status:  Discontinued        2 g 200 mL/hr over 30 Minutes Intravenous Every 12 hours 04/21/23 1242 04/23/23 0926   04/20/23 1800  cefTRIAXone (ROCEPHIN) 1 g in sodium chloride 0.9 % 100 mL IVPB  Status:  Discontinued        1 g 200 mL/hr over 30 Minutes Intravenous Every 24 hours 04/19/23 2244 04/21/23 1231   04/19/23 1815  cefTRIAXone (ROCEPHIN) 1 g in sodium chloride 0.9 % 100 mL IVPB        1 g 200 mL/hr over 30 Minutes Intravenous  Once 04/19/23 1806 04/19/23 2015        Assessment/Plan Ileus vs possible sbo - CT 3/13 w/ mildly dilated small bowel loops w/ possible transition in the RLQ w/  collapsed distal small bowel in the RLQ. Hx of ex lap and SBR in 01/2022 by Dr. Robyne Peers for SBO.  - Given his current situation with his recent stent placement (3/11), infection, and now renal hemorrhage, it is more likely the patient has a reactive ileus secondary to these issues.  However, he has had prior abdominal surgery and could be developing a SBO secondary to adhesive disease.  - Keep K >=4, Phos >= 3, Mg >= 2 and mobilize for bowel function -Protocol films 3/14 evening with persistent small bowel distention and contrast in the fundus.  Given overall history, we will continue NG decompression for today and potentially repeat the protocol in 24 hours if no clinical improvement/bowel function.  Hopefully patient will improve with conservative management. If patient fails to improve with conservative management, they may require exploratory surgery during  admission.  -Would consider transitioning meds to IV as able as he is probably not absorbing most of these right now -Had a bowel movement this morning and NG output seems to be downtrending.  I will write to repeat the contrast protocol overnight tonight with follow-up x-rays in the morning and hopefully things will continue to improve from the GI standpoint. -Surgery will continue to follow  FEN - NPO, NGT to LIWS, IVF, IV potassium replacement ordered for potassium 3.6 VTE - SCDs, per primary ID - Cefepime now changed to Merrem for UTI/Pyelonephritis. Afebrile.  White count improving today now down to 17 from 20.9 Foley - Per urology.    I reviewed nursing notes, Consultant (Urology) notes, hospitalist notes, last 24 h vitals and pain scores, last 48 h intake and output, last 24 h labs and trends, and last 24 h imaging results.   LOS: 6 days    Berna Bue, MD [facility] Surgery 04/25/2023, 8:06 AM Please see Amion for pager number during day hours 7:00am-4:30pm

## 2023-04-26 ENCOUNTER — Inpatient Hospital Stay (HOSPITAL_COMMUNITY)

## 2023-04-26 DIAGNOSIS — N12 Tubulo-interstitial nephritis, not specified as acute or chronic: Secondary | ICD-10-CM | POA: Diagnosis not present

## 2023-04-26 LAB — COMPREHENSIVE METABOLIC PANEL
ALT: 217 U/L — ABNORMAL HIGH (ref 0–44)
AST: 111 U/L — ABNORMAL HIGH (ref 15–41)
Albumin: 2 g/dL — ABNORMAL LOW (ref 3.5–5.0)
Alkaline Phosphatase: 100 U/L (ref 38–126)
Anion gap: 9 (ref 5–15)
BUN: 32 mg/dL — ABNORMAL HIGH (ref 8–23)
CO2: 23 mmol/L (ref 22–32)
Calcium: 8.5 mg/dL — ABNORMAL LOW (ref 8.9–10.3)
Chloride: 113 mmol/L — ABNORMAL HIGH (ref 98–111)
Creatinine, Ser: 0.98 mg/dL (ref 0.61–1.24)
GFR, Estimated: >60 mL/min (ref >60)
Glucose, Bld: 111 mg/dL — ABNORMAL HIGH (ref 70–99)
Potassium: 3.5 mmol/L (ref 3.5–5.1)
Sodium: 145 mmol/L (ref 135–145)
Total Bilirubin: 1.5 mg/dL — ABNORMAL HIGH (ref 0.0–1.2)
Total Protein: 6.5 g/dL (ref 6.5–8.1)

## 2023-04-26 LAB — CBC
HCT: 26.8 % — ABNORMAL LOW (ref 39.0–52.0)
Hemoglobin: 8.5 g/dL — ABNORMAL LOW (ref 13.0–17.0)
MCH: 31.5 pg (ref 26.0–34.0)
MCHC: 31.7 g/dL (ref 30.0–36.0)
MCV: 99.3 fL (ref 80.0–100.0)
Platelets: 462 10*3/uL — ABNORMAL HIGH (ref 150–400)
RBC: 2.7 MIL/uL — ABNORMAL LOW (ref 4.22–5.81)
RDW: 14 % (ref 11.5–15.5)
WBC: 13.2 10*3/uL — ABNORMAL HIGH (ref 4.0–10.5)
nRBC: 0 % (ref 0.0–0.2)

## 2023-04-26 LAB — GLUCOSE, CAPILLARY: Glucose-Capillary: 112 mg/dL — ABNORMAL HIGH (ref 70–99)

## 2023-04-26 LAB — MAGNESIUM: Magnesium: 2.1 mg/dL (ref 1.7–2.4)

## 2023-04-26 LAB — PHOSPHORUS: Phosphorus: 2.8 mg/dL (ref 2.5–4.6)

## 2023-04-26 MED ORDER — POTASSIUM CHLORIDE 10 MEQ/100ML IV SOLN
10.0000 meq | INTRAVENOUS | Status: AC
  Administered 2023-04-26 (×3): 10 meq via INTRAVENOUS
  Filled 2023-04-26: qty 100

## 2023-04-26 NOTE — Progress Notes (Signed)
 PROGRESS NOTE  Luke Foster VWU:[id number] DOB: August 08, 1950 DOA: 04/19/2023 PCP: Elfredia Nevins, MD   LOS: 7 days   Brief Narrative / Interim history: 73 year old male with dementia, lives at home, neck pain status post spinal cord stimulator, hyperlipidemia, neurogenic bladder with indwelling Foley, HTN who comes into the hospital with flank pain, found to have nephrolithiasis.  Urology was consulted, he underwent cystoscopy, pyelogram, right stent insertion by Dr. Heide Scales on 3/11, and hospital course complicated by small bowel obstruction on 3/13, general surgery was consulted  Subjective / 24h Interval events: Feels less distended  Assesement and Plan: Principal Problem:   Pyelonephritis Active Problems:   Neuropathy   Chronic pain syndrome   Status post insertion of spinal cord stimulator   Hypertension   Degenerative disc disease, lumbar   Urinary retention   Obstructive uropathy   Anxiety   Dementia (HCC)  Principal problem Right-sided hydronephrosis, nephrolithiasis, pyelonephritis -urine cultures from admission growing Enterobacter.  Patient has been placed on meropenem, continue for now.  Will have definitive stone removal next Friday -Probably best to stay on antibiotics till then  Active problems Small bowel obstruction -General Surgery following, currently has an NG tube in place.  Seems to be passing contrast into the large intestine -Continues to have bowel movement -Clamping trial today  Chronic back pain-unable to use oral agents now, continue IV PRNs  Chronic urinary retention-currently has a Foley, continue.  Resume home medications when able to take p.o.  Dementia-Brother is a primary caretaker  Essential hypertension-blood pressure overall stable  Acute kidney injury-resolved  Moderate hemorrhage in the right renal cyst -watch per urology  Scheduled Meds:  Chlorhexidine Gluconate Cloth  6 each Topical Daily   polyethylene glycol  17 g Oral BID    senna-docusate  1 tablet Oral BID   Continuous Infusions:  sodium chloride 125 mL/hr at 04/26/23 0232   meropenem (MERREM) IV 1 g (04/26/23 0508)   potassium chloride 10 mEq (04/26/23 0954)   PRN Meds:.bisacodyl, hydrALAZINE, HYDROmorphone (DILAUDID) injection, hyoscyamine, iohexol, lidocaine, methocarbamol (ROBAXIN) injection **OR** methocarbamol, ondansetron **OR** ondansetron (ZOFRAN) IV, oxyCODONE, rOPINIRole  Current Outpatient Medications  Medication Instructions   acetaminophen (TYLENOL) 1,000 mg, Oral, Every 6 hours PRN   ascorbic acid (VITAMIN C) 500 mg, Oral, Daily   cyanocobalamin (VITAMIN B12) 1,000 mcg, Oral, 2 times daily   Dilt-XR 120 mg, Oral, Daily   docusate sodium (COLACE) 100 mg, Oral, 3 times daily   enalapril (VASOTEC) 5 mg, Oral, 2 times daily   lidocaine (LIDODERM) 5 % 1 patch, Transdermal, Daily PRN   mirabegron ER (MYRBETRIQ) 50 mg, Oral, Daily   oxyCODONE (ROXICODONE) 15 mg, Oral, Every 6 hours PRN   polyethylene glycol (MIRALAX / GLYCOLAX) 17 g, Oral, 2 times daily   rOPINIRole (REQUIP) 0.5 mg, At bedtime PRN   solifenacin (VESICARE) 5 mg, Daily   sulfamethoxazole-trimethoprim (BACTRIM) 400-80 MG tablet 1 tablet, Oral, Daily at bedtime   tamsulosin (FLOMAX) 0.4 mg, Oral, Every evening   venlafaxine XR (EFFEXOR-XR) 75 mg, Oral, 2 times daily   Vibegron (GEMTESA PO) 50 mg, Daily at bedtime   Vitamin D 2,000 Units, Oral, Daily    Diet Orders (From admission, onward)     Start     Ordered   04/26/23 0844  Diet clear liquid Fluid consistency: Thin  Diet effective now       Question:  Fluid consistency:  Answer:  Thin   04/26/23 0843  DVT prophylaxis: SCDs Start: 04/19/23 2245   Lab Results  Component Value Date   PLT 462 (H) 04/26/2023      Code Status: Full Code  Family Communication: no family at bedside  Status is: Inpatient Remains inpatient appropriate because: SBO   Level of care: Med-Surg  Consultants:   Urology General Surgery  Objective: Vitals:   04/25/23 1318 04/25/23 2147 04/26/23 0149 04/26/23 0508  BP: (!) 135/95 (!) 151/80 (!) 152/78 136/78  Pulse: 95 92 85 68  Resp: 18 20 18 20   Temp: 97.9 F (36.6 C) 98.5 F (36.9 C) 98.4 F (36.9 C) 98.5 F (36.9 C)  TempSrc: Oral Oral Oral Oral  SpO2: 99% 92% 98% 98%  Weight:      Height:        Intake/Output Summary (Last 24 hours) at 04/26/2023 1046 Last data filed at 04/26/2023 1000 Gross per 24 hour  Intake 2774.59 ml  Output 2025 ml  Net 749.59 ml   Wt Readings from Last 3 Encounters:  04/19/23 81.6 kg  12/28/22 90.7 kg  12/23/22 90.7 kg    Examination:  Constitutional: NAD Eyes: lids and conjunctivae normal, no scleral icterus ENMT: mmm Neck: normal, supple Respiratory: clear to auscultation bilaterally, no wheezing, no crackles.  Cardiovascular: Regular rate and rhythm, no murmurs / rubs / gallops. No LE edema. Abdomen: soft, no distention, no tenderness. Bowel sounds positive.   Data Reviewed: I have independently reviewed following labs and imaging studies   CBC Recent Labs  Lab 04/19/23 1501 04/20/23 0455 04/21/23 0525 04/23/23 0439 04/24/23 0540 04/25/23 0454 04/26/23 0411  WBC 10.9*   < > 10.5 19.4* 20.9* 17.0* 13.2*  HGB 11.5*   < > 9.3* 9.5* 9.9* 8.4* 8.5*  HCT 35.9*   < > 28.9* 29.7* 31.4* 27.8* 26.8*  PLT 288   < > 309 409* 404* 453* 462*  MCV 97.3   < > 96.3 96.1 99.4 100.0 99.3  MCH 31.2   < > 31.0 30.7 31.3 30.2 31.5  MCHC 32.0   < > 32.2 32.0 31.5 30.2 31.7  RDW 14.1   < > 14.0 13.7 13.6 13.8 14.0  LYMPHSABS 0.5*  --   --   --   --   --   --   MONOABS 0.5  --   --   --   --   --   --   EOSABS 0.0  --   --   --   --   --   --   BASOSABS 0.0  --   --   --   --   --   --    < > = values in this interval not displayed.    Recent Labs  Lab 04/19/23 1501 04/20/23 0450 04/21/23 0522 04/21/23 0525 04/22/23 0541 04/23/23 0439 04/24/23 0540 04/25/23 0454 04/26/23 0411  NA 135 134*   --    < > 137 134* 140 142 145  K 4.1 4.4  --    < > 3.7 3.9 3.8 3.6 3.5  CL 105 105  --    < > 104 100 104 108 113*  CO2 16* 19*  --    < > 21* 24 25 25 23   GLUCOSE 127* 100*  --    < > 127* 116* 120* 109* 111*  BUN 39* 36*  --    < > 35* 34* 34* 35* 32*  CREATININE 1.88* 1.75*  --    < > 1.21 1.20  1.23 1.04 0.98  CALCIUM 10.0 9.3  --    < > 9.1 8.8* 9.1 8.7* 8.5*  AST 21 18  --   --   --   --   --   --  111*  ALT 22 20  --   --   --   --   --   --  217*  ALKPHOS 85 83  --   --   --   --   --   --  100  BILITOT 0.6 0.7  --   --   --   --   --   --  1.5*  ALBUMIN 3.2* 2.7*  --   --   --   --   --   --  2.0*  MG  --   --   --   --   --   --  2.2 2.1 2.1  LATICACIDVEN 1.7  1.9  --   --   --   --   --   --   --   --   TSH  --   --  0.104*  --   --   --   --   --   --    < > = values in this interval not displayed.    ------------------------------------------------------------------------------------------------------------------ No results for input(s): "CHOL", "HDL", "LDLCALC", "TRIG", "CHOLHDL", "LDLDIRECT" in the last 72 hours.  No results found for: "HGBA1C" ------------------------------------------------------------------------------------------------------------------ No results for input(s): "TSH", "T4TOTAL", "T3FREE", "THYROIDAB" in the last 72 hours.  Invalid input(s): "FREET3"  Cardiac Enzymes No results for input(s): "CKMB", "TROPONINI", "MYOGLOBIN" in the last 168 hours.  Invalid input(s): "CK" ------------------------------------------------------------------------------------------------------------------    Component Value Date/Time   BNP 31.2 06/17/2020 1255    CBG: Recent Labs  Lab 04/26/23 0738  GLUCAP 112*    Recent Results (from the past 240 hours)  Urine Culture     Status: Abnormal   Collection Time: 04/19/23  4:23 PM   Specimen: Urine, Random  Result Value Ref Range Status   Specimen Description   Final    URINE, RANDOM Performed at [facility], [address], [place], [place] [address]    Special Requests   Final    NONE Reflexed from [id number] Performed at [facility], [address], [place], [place] [address]    Culture 20,000 COLONIES/mL ENTEROBACTER CLOACAE (A)  Final   Report Status 04/21/2023 FINAL  Final   Organism ID, Bacteria ENTEROBACTER CLOACAE (A)  Final      Susceptibility   Enterobacter cloacae - MIC*    CEFEPIME 1 SENSITIVE Sensitive     CIPROFLOXACIN 1 RESISTANT Resistant     GENTAMICIN <=1 SENSITIVE Sensitive     IMIPENEM 1 SENSITIVE Sensitive     NITROFURANTOIN 128 RESISTANT Resistant     TRIMETH/SULFA <=20 SENSITIVE Sensitive     PIP/TAZO 64 INTERMEDIATE Intermediate ug/mL    * 20,000 COLONIES/mL ENTEROBACTER CLOACAE  Culture, blood (Routine X 2) w Reflex to ID Panel     Status: None   Collection Time: 04/20/23  8:23 AM   Specimen: BLOOD RIGHT HAND  Result Value Ref Range Status   Specimen Description   Final    BLOOD RIGHT HAND Performed at [facility], [address], [place], [place] [address]    Special Requests   Final    BOTTLES DRAWN AEROBIC ONLY Blood Culture results may not be optimal due to an inadequate volume of blood received in culture  bottles Performed at [facility], [address], [place], [place] [address]    Culture   Final    NO GROWTH 5 DAYS Performed at [facility], [address] [place], [place] [address]    Report Status 04/25/2023 FINAL  Final  Culture, blood (Routine X 2) w Reflex to ID Panel     Status: None   Collection Time: 04/20/23  8:23 AM   Specimen: BLOOD RIGHT ARM  Result Value Ref Range Status   Specimen Description   Final    BLOOD RIGHT ARM Performed at [facility], [address] [place], [place] [address]    Special Requests   Final    BOTTLES DRAWN AEROBIC ONLY Blood Culture results may not be optimal due to an inadequate volume of blood received in culture bottles Performed at [facility], [address], [place], [place] [address]    Culture   Final    NO GROWTH 5 DAYS Performed at [facility], [address], [place], [place] [address]    Report Status 04/25/2023 FINAL  Final     Radiology Studies: DG Abd Portable 1V-Small Bowel Obstruction Protocol-initial, 8 hr delay Result Date: 04/26/2023 CLINICAL DATA:  Small-bowel obstruction.  8 hour delayed image. EXAM: PORTABLE ABDOMEN - 1 VIEW COMPARISON:  Radiographs 04/24/2023 and 04/23/2023.  CT 04/22/2023. FINDINGS: 0518 hours. Two views are submitted. There is no prior imaging immediately following enteric contrast administration for comparison. Tip of the enteric tube overlies the right upper quadrant of the abdomen, likely in the distal stomach. Double-J right ureteral stent and bladder catheter remain in place. A small amount of enteric contrast is present within the decompressed stomach. There is dilute contrast within multiple mildly dilated loops of small bowel. Some contrast has extended into the ascending and descending colon. No definite extravasated enteric contrast identified. No acute osseous findings are seen status post lower lumbar fusion. IMPRESSION: Dilute contrast within multiple mildly dilated loops of small bowel with some contrast extending into the ascending and descending colon. Findings may reflect a partial small bowel obstruction or ileus. No high-grade obstruction or definite extravasated enteric contrast identified. Electronically Signed   By: Carey Bullocks M.D.   On: 04/26/2023 09:22     Pamella Pert, MD, PhD [facility]  Between 7 am - 7 pm I am available, please contact me via Amion (for emergencies) or Securechat (non urgent messages)  Between 7 pm - 7 am I am not available, please contact night coverage MD/APP via Amion

## 2023-04-26 NOTE — Progress Notes (Signed)
 6 Days Post-Op  Subjective: CC: Patients brother is at bedside.   Patient reports crampy abdominal pain and distension are improved from yesterday. Still required multiple doses of IV pain medication yesterday. No nausea. NGT output w/ 0cc documented in the last 12 hours > 100cc in cannister. Passing flatus. 3 large liquid bm's reported in the last 24 hours.   Afebrile. No tachycardia or hypotension. WBC downtrending at 13.2. K 3.5. Mg 2.1. Phos 2.8.   Objective: Vital signs in last 24 hours: Temp:  [97.9 F (36.6 C)-98.5 F (36.9 C)] 98.5 F (36.9 C) (03/17 0508) Pulse Rate:  [68-95] 68 (03/17 0508) Resp:  [18-20] 20 (03/17 0508) BP: (135-152)/(78-95) 136/78 (03/17 0508) SpO2:  [92 %-99 %] 98 % (03/17 0508) Last BM Date : 04/25/23  Intake/Output from previous day: 03/16 0701 - 03/17 0700 In: 3672.7 [P.O.:300; I.V.:2569.7; IV Piggyback:803] Out: 2125 [Urine:1750; Emesis/NG output:375] Intake/Output this shift: No intake/output data recorded.  PE: Gen:  Alert, NAD, pleasant Abd: Soft, at least mild distension that is improved from when I saw him Friday. He has mild ttp on the R side of his abdomen that is also improved from Friday. No rigidity or guarding. Otherwise NT. +BS.   Lab Results:  Recent Labs    04/25/23 0454 04/26/23 0411  WBC 17.0* 13.2*  HGB 8.4* 8.5*  HCT 27.8* 26.8*  PLT 453* 462*   BMET Recent Labs    04/25/23 0454 04/26/23 0411  NA 142 145  K 3.6 3.5  CL 108 113*  CO2 25 23  GLUCOSE 109* 111*  BUN 35* 32*  CREATININE 1.04 0.98  CALCIUM 8.7* 8.5*   PT/INR No results for input(s): "LABPROT", "INR" in the last 72 hours. CMP     Component Value Date/Time   NA 145 04/26/2023 0411   K 3.5 04/26/2023 0411   CL 113 (H) 04/26/2023 0411   CO2 23 04/26/2023 0411   GLUCOSE 111 (H) 04/26/2023 0411   BUN 32 (H) 04/26/2023 0411   CREATININE 0.98 04/26/2023 0411   CALCIUM 8.5 (L) 04/26/2023 0411   PROT 6.5 04/26/2023 0411   ALBUMIN 2.0 (L)  04/26/2023 0411   AST 111 (H) 04/26/2023 0411   ALT 217 (H) 04/26/2023 0411   ALKPHOS 100 04/26/2023 0411   BILITOT 1.5 (H) 04/26/2023 0411   GFRNONAA >60 04/26/2023 0411   GFRAA >60 04/26/2019 1721   Lipase     Component Value Date/Time   LIPASE 34 06/17/2022 1323    Studies/Results: DG Abd 1 View Result Date: 04/24/2023 CLINICAL DATA:  [id number] Small bowel obstruction (HCC) [id number] EXAM: ABDOMEN - 1 VIEW COMPARISON:  04/23/2023 FINDINGS: Enteric tube is position within the stomach. Tube appears kinked upon itself. Dilute contrast within dilated loops of small bowel. No definite contrast within the colon. No gross free intraperitoneal air on supine imaging. Right sided nephroureteral stent. IMPRESSION: 1. Dilute contrast within dilated loops of small bowel. No definite contrast within the colon. 2. Enteric tube is positioned within the stomach. Tube appears kinked upon itself. Electronically Signed   By: Duanne Guess D.O.   On: 04/24/2023 14:02    Anti-infectives: Anti-infectives (From admission, onward)    Start     Dose/Rate Route Frequency Ordered Stop   04/24/23 1130  meropenem (MERREM) 1 g in sodium chloride 0.9 % 100 mL IVPB        1 g 200 mL/hr over 30 Minutes Intravenous Every 8 hours 04/24/23 1028     04/23/23  1115  piperacillin-tazobactam (ZOSYN) IVPB 3.375 g  Status:  Discontinued        3.375 g 12.5 mL/hr over 240 Minutes Intravenous Every 8 hours 04/23/23 1016 04/24/23 1028   04/21/23 1330  ceFEPIme (MAXIPIME) 2 g in sodium chloride 0.9 % 100 mL IVPB  Status:  Discontinued        2 g 200 mL/hr over 30 Minutes Intravenous Every 12 hours 04/21/23 1242 04/23/23 0926   04/20/23 1800  cefTRIAXone (ROCEPHIN) 1 g in sodium chloride 0.9 % 100 mL IVPB  Status:  Discontinued        1 g 200 mL/hr over 30 Minutes Intravenous Every 24 hours 04/19/23 2244 04/21/23 1231   04/19/23 1815  cefTRIAXone (ROCEPHIN) 1 g in sodium chloride 0.9 % 100 mL IVPB        1 g 200 mL/hr over 30  Minutes Intravenous  Once 04/19/23 1806 04/19/23 2015        Assessment/Plan Ileus vs possible sbo - CT 3/13 w/ mildly dilated small bowel loops w/ possible transition in the RLQ w/ collapsed distal small bowel in the RLQ. Hx of ex lap and SBR in 01/2022 by Dr. Robyne Peers for SBO.  - Given his current situation with his recent stent placement (3/11), infection, and now renal hemorrhage, it is possible this is a reactive ileus secondary to these issues, however, as he has had prior abdominal surgery this could be a SBO secondary to adhesive disease.  - Keep K >=4, Phos >= 3, Mg >= 2 and mobilize for bowel function - No indication for emergency surgery today.  - Xray reads pending but appears to have contrast in colon on our read. He reports improvement of symptoms and is now having bowel function. Will clamp NGT and allow CLD. Possible d/c NGT later today.    FEN - Clamp NGT. CLD. IVF per TRH. IV potassium replacement ordered for potassium 3.5 VTE - SCDs, per primary ID - Cefepime now changed to Merrem for UTI/Pyelonephritis. Afebrile.  White count improving today now down to 17 to 13.2 Foley - Per urology.   I reviewed nursing notes, last 24 h vitals and pain scores, last 48 h intake and output, last 24 h labs and trends, and last 24 h imaging results.    LOS: 7 days    Jacinto Halim, [facility] 04/26/2023, 8:36 AM Please see Amion for pager number during day hours 7:00am-4:30pm

## 2023-04-26 NOTE — Progress Notes (Signed)
 Occupational Therapy Treatment Patient Details Name: Luke Foster MRN: [id number] DOB: 03-10-1950 Today's Date: 04/26/2023   History of present illness 73 year old male who presented from home with c/o worsening back pain; had fall ~ 1 wk prior to adm. CT head negative.  CT abdomen/pelvi showed moderate right hydronephrosis, hydroureter, 9 mm obstructing stone in the distal right ureter, additional 6 mm stone, possible pyelonephritis. Urology consulted, s/p right ureteral stent on 3/11.  PMH: chronic back pain, chronic Foley catheter for urinary retention, nephrolithiasis, hypertension, depression, dementia .   OT comments  Pt making progress with functional goals. Pt very pleasant and cooperative and agreeable to OOB activity. OT will continue to follow acutely to maximize level of function and safety      If plan is discharge home, recommend the following:  A lot of help with bathing/dressing/bathroom;A lot of help with walking and/or transfers;Assistance with cooking/housework;Assist for transportation;Direct supervision/assist for medications management;Supervision due to cognitive status;Help with stairs or ramp for entrance   Equipment Recommendations  Other (comment) (TBD at next venue of care)    Recommendations for Other Services      Precautions / Restrictions Precautions Precautions: Fall Restrictions Weight Bearing Restrictions Per Provider Order: No       Mobility Bed Mobility Overal bed mobility: Needs Assistance Bed Mobility: Rolling, Sidelying to Sit, Sit to Supine Rolling: Min assist Sidelying to sit: Min assist   Sit to supine: Min assist   General bed mobility comments: min A to elevate trunk and LE mgt    Transfers Overall transfer level: Needs assistance Equipment used: Rolling walker (2 wheels) Transfers: Sit to/from Stand, Bed to chair/wheelchair/BSC Sit to Stand: Max assist, Mod assist          Lateral/Scoot Transfers: Mod assist General  transfer comment: max A initally from sitting EOB to stand to RW, mod A to stand form chair     Balance Overall balance assessment: Needs assistance, History of Falls Sitting-balance support: Feet supported, Single extremity supported, No upper extremity supported Sitting balance-Leahy Scale: Fair     Standing balance support: Bilateral upper extremity supported, Reliant on assistive device for balance, During functional activity Standing balance-Leahy Scale: Poor                             ADL either performed or assessed with clinical judgement   ADL Overall ADL's : Needs assistance/impaired     Grooming: Wash/dry hands;Wash/dry face;Contact guard assist;Sitting         Lower Body Bathing Details (indicate cue type and reason): donning socks Upper Body Dressing : Contact guard assist;Sitting   Lower Body Dressing: Total assistance;Sitting/lateral leans Lower Body Dressing Details (indicate cue type and reason): donning/doffing socks Toilet Transfer: Moderate assistance;Rolling walker (2 wheels);Stand-pivot;Cueing for sequencing;Cueing for safety Toilet Transfer Details (indicate cue type and reason): simulated to chair, verbal cues for hand/foot placement using RW Toileting- Clothing Manipulation and Hygiene: Maximal assistance;Sit to/from stand Toileting - Clothing Manipulation Details (indicate cue type and reason): clothing mgt     Functional mobility during ADLs: Moderate assistance;Rolling walker (2 wheels);Cueing for safety      Extremity/Trunk Assessment Upper Extremity Assessment Upper Extremity Assessment: Generalized weakness   Lower Extremity Assessment Lower Extremity Assessment: Defer to PT evaluation        Vision Ability to See in Adequate Light: 0 Adequate Patient Visual Report: No change from baseline     Perception     Praxis  Communication Communication Communication: No apparent difficulties Factors Affecting Communication:  Hearing impaired   Cognition Arousal: Alert Behavior During Therapy: WFL for tasks assessed/performed Cognition: Cognition impaired, No family/caregiver present to determine baseline   Orientation impairments: Place         OT - Cognition Comments: Pt is rather particular and seems to have established a routine which works for him. Pt likes to know step by step what the plan is and then requires repitition. He does not consistently follow commands, but when reinforced pt will naswer "I know".                 Following commands: Impaired Following commands impaired: Only follows one step commands consistently      Cueing   Cueing Techniques: Verbal cues, Visual cues  Exercises      Shoulder Instructions       General Comments      Pertinent Vitals/ Pain       Pain Assessment Pain Assessment: No/denies pain Pain Score: 0-No pain Pain Intervention(s): Monitored during session, Repositioned  Home Living                                          Prior Functioning/Environment              Frequency  Min 1X/week        Progress Toward Goals  OT Goals(current goals can now be found in the care plan section)  Progress towards OT goals: Progressing toward goals     Plan      Co-evaluation                 AM-PAC OT "6 Clicks" Daily Activity     Outcome Measure   Help from another person eating meals?: None Help from another person taking care of personal grooming?: A Little Help from another person toileting, which includes using toliet, bedpan, or urinal?: A Lot Help from another person bathing (including washing, rinsing, drying)?: A Lot Help from another person to put on and taking off regular upper body clothing?: A Little Help from another person to put on and taking off regular lower body clothing?: Total 6 Click Score: 15    End of Session Equipment Utilized During Treatment: Gait belt;Rolling walker (2 wheels)  OT  Visit Diagnosis: Unsteadiness on feet (R26.81);Pain;History of falling (Z91.81);Muscle weakness (generalized) (M62.81);Other symptoms and signs involving cognitive function   Activity Tolerance Patient tolerated treatment well   Patient Left in bed;with call bell/phone within reach;with bed alarm set   Nurse Communication          Time: 1043-1101 OT Time Calculation (min): 18 min  Charges: OT General Charges $OT Visit: 1 Visit OT Treatments $Therapeutic Activity: 8-22 mins    Galen Manila 04/26/2023, 12:46 PM

## 2023-04-27 ENCOUNTER — Inpatient Hospital Stay (HOSPITAL_COMMUNITY)

## 2023-04-27 DIAGNOSIS — N12 Tubulo-interstitial nephritis, not specified as acute or chronic: Secondary | ICD-10-CM | POA: Diagnosis not present

## 2023-04-27 LAB — COMPREHENSIVE METABOLIC PANEL
ALT: 224 U/L — ABNORMAL HIGH (ref 0–44)
AST: 206 U/L — ABNORMAL HIGH (ref 15–41)
Albumin: 2.1 g/dL — ABNORMAL LOW (ref 3.5–5.0)
Alkaline Phosphatase: 92 U/L (ref 38–126)
Anion gap: 9 (ref 5–15)
BUN: 31 mg/dL — ABNORMAL HIGH (ref 8–23)
CO2: 23 mmol/L (ref 22–32)
Calcium: 8.5 mg/dL — ABNORMAL LOW (ref 8.9–10.3)
Chloride: 111 mmol/L (ref 98–111)
Creatinine, Ser: 1 mg/dL (ref 0.61–1.24)
GFR, Estimated: >60 mL/min (ref >60)
Glucose, Bld: 116 mg/dL — ABNORMAL HIGH (ref 70–99)
Potassium: 3.3 mmol/L — ABNORMAL LOW (ref 3.5–5.1)
Sodium: 143 mmol/L (ref 135–145)
Total Bilirubin: 1.5 mg/dL — ABNORMAL HIGH (ref 0.0–1.2)
Total Protein: 6.1 g/dL — ABNORMAL LOW (ref 6.5–8.1)

## 2023-04-27 LAB — PHOSPHORUS: Phosphorus: 3.1 mg/dL (ref 2.5–4.6)

## 2023-04-27 LAB — CBC
HCT: 24.3 % — ABNORMAL LOW (ref 39.0–52.0)
Hemoglobin: 7.8 g/dL — ABNORMAL LOW (ref 13.0–17.0)
MCH: 31.2 pg (ref 26.0–34.0)
MCHC: 32.1 g/dL (ref 30.0–36.0)
MCV: 97.2 fL (ref 80.0–100.0)
Platelets: 467 10*3/uL — ABNORMAL HIGH (ref 150–400)
RBC: 2.5 MIL/uL — ABNORMAL LOW (ref 4.22–5.81)
RDW: 14.3 % (ref 11.5–15.5)
WBC: 11 10*3/uL — ABNORMAL HIGH (ref 4.0–10.5)
nRBC: 0 % (ref 0.0–0.2)

## 2023-04-27 LAB — MAGNESIUM: Magnesium: 2 mg/dL (ref 1.7–2.4)

## 2023-04-27 MED ORDER — SULFAMETHOXAZOLE-TRIMETHOPRIM 800-160 MG PO TABS
1.0000 | ORAL_TABLET | Freq: Two times a day (BID) | ORAL | Status: DC
  Administered 2023-04-27 – 2023-05-01 (×8): 1 via ORAL
  Filled 2023-04-27 (×9): qty 1

## 2023-04-27 MED ORDER — TAMSULOSIN HCL 0.4 MG PO CAPS
0.4000 mg | ORAL_CAPSULE | Freq: Every evening | ORAL | Status: DC
  Administered 2023-04-27 – 2023-04-30 (×4): 0.4 mg via ORAL
  Filled 2023-04-27 (×4): qty 1

## 2023-04-27 MED ORDER — VITAMIN B-12 1000 MCG PO TABS
1000.0000 ug | ORAL_TABLET | Freq: Every day | ORAL | Status: DC
  Administered 2023-04-27 – 2023-04-29 (×3): 1000 ug via ORAL
  Filled 2023-04-27 (×3): qty 1

## 2023-04-27 MED ORDER — HYDROMORPHONE HCL 1 MG/ML IJ SOLN
1.0000 mg | Freq: Three times a day (TID) | INTRAMUSCULAR | Status: DC | PRN
  Administered 2023-04-27 (×2): 1 mg via INTRAVENOUS
  Filled 2023-04-27 (×2): qty 1

## 2023-04-27 NOTE — Plan of Care (Signed)
   Problem: Clinical Measurements: Goal: Will remain free from infection Outcome: Progressing Goal: Diagnostic test results will improve Outcome: Progressing

## 2023-04-27 NOTE — Progress Notes (Signed)
 7 Days Post-Op  Subjective: Patients brother is at bedside.   No complaints.  No abdominal pain, no nausea/vomiting.  + flatus/BM.  Tolerating some CLD yesterday  Objective: Vital signs in last 24 hours: Temp:  [98.1 F (36.7 C)-99.3 F (37.4 C)] 98.7 F (37.1 C) (03/18 2956) Pulse Rate:  [79-83] 79 (03/18 0632) Resp:  [17-18] 17 (03/18 2130) BP: (129-141)/(73-81) 137/73 (03/18 8657) SpO2:  [100 %] 100 % (03/18 8469) Last BM Date : 04/26/23  Intake/Output from previous day: 03/17 0701 - 03/18 0700 In: 7681.4 [P.O.:820; I.V.:6382.2; IV Piggyback:479.2] Out: 1400 [Urine:1400] Intake/Output this shift: No intake/output data recorded.  PE: Gen:  Alert, NAD, pleasant Abd: Soft, NT, ND (per patient), +BS  Lab Results:  Recent Labs    04/26/23 0411 04/27/23 0437  WBC 13.2* 11.0*  HGB 8.5* 7.8*  HCT 26.8* 24.3*  PLT 462* 467*   BMET Recent Labs    04/26/23 0411 04/27/23 0437  NA 145 143  K 3.5 3.3*  CL 113* 111  CO2 23 23  GLUCOSE 111* 116*  BUN 32* 31*  CREATININE 0.98 1.00  CALCIUM 8.5* 8.5*   PT/INR No results for input(s): "LABPROT", "INR" in the last 72 hours. CMP     Component Value Date/Time   NA 143 04/27/2023 0437   K 3.3 (L) 04/27/2023 0437   CL 111 04/27/2023 0437   CO2 23 04/27/2023 0437   GLUCOSE 116 (H) 04/27/2023 0437   BUN 31 (H) 04/27/2023 0437   CREATININE 1.00 04/27/2023 0437   CALCIUM 8.5 (L) 04/27/2023 0437   PROT 6.1 (L) 04/27/2023 0437   ALBUMIN 2.1 (L) 04/27/2023 0437   AST 206 (H) 04/27/2023 0437   ALT 224 (H) 04/27/2023 0437   ALKPHOS 92 04/27/2023 0437   BILITOT 1.5 (H) 04/27/2023 0437   GFRNONAA >60 04/27/2023 0437   GFRAA >60 04/26/2019 1721   Lipase     Component Value Date/Time   LIPASE 34 06/17/2022 1323    Studies/Results: DG Abd Portable 1V-Small Bowel Obstruction Protocol-initial, 8 hr delay Result Date: 04/26/2023 CLINICAL DATA:  Small-bowel obstruction.  8 hour delayed image. EXAM: PORTABLE ABDOMEN -  1 VIEW COMPARISON:  Radiographs 04/24/2023 and 04/23/2023.  CT 04/22/2023. FINDINGS: 0518 hours. Two views are submitted. There is no prior imaging immediately following enteric contrast administration for comparison. Tip of the enteric tube overlies the right upper quadrant of the abdomen, likely in the distal stomach. Double-J right ureteral stent and bladder catheter remain in place. A small amount of enteric contrast is present within the decompressed stomach. There is dilute contrast within multiple mildly dilated loops of small bowel. Some contrast has extended into the ascending and descending colon. No definite extravasated enteric contrast identified. No acute osseous findings are seen status post lower lumbar fusion. IMPRESSION: Dilute contrast within multiple mildly dilated loops of small bowel with some contrast extending into the ascending and descending colon. Findings may reflect a partial small bowel obstruction or ileus. No high-grade obstruction or definite extravasated enteric contrast identified. Electronically Signed   By: Carey Bullocks M.D.   On: 04/26/2023 09:22    Anti-infectives: Anti-infectives (From admission, onward)    Start     Dose/Rate Route Frequency Ordered Stop   04/24/23 1130  meropenem (MERREM) 1 g in sodium chloride 0.9 % 100 mL IVPB        1 g 200 mL/hr over 30 Minutes Intravenous Every 8 hours 04/24/23 1028     04/23/23 1115  piperacillin-tazobactam (ZOSYN) IVPB 3.375 g  Status:  Discontinued        3.375 g 12.5 mL/hr over 240 Minutes Intravenous Every 8 hours 04/23/23 1016 04/24/23 1028   04/21/23 1330  ceFEPIme (MAXIPIME) 2 g in sodium chloride 0.9 % 100 mL IVPB  Status:  Discontinued        2 g 200 mL/hr over 30 Minutes Intravenous Every 12 hours 04/21/23 1242 04/23/23 0926   04/20/23 1800  cefTRIAXone (ROCEPHIN) 1 g in sodium chloride 0.9 % 100 mL IVPB  Status:  Discontinued        1 g 200 mL/hr over 30 Minutes Intravenous Every 24 hours 04/19/23 2244  04/21/23 1231   04/19/23 1815  cefTRIAXone (ROCEPHIN) 1 g in sodium chloride 0.9 % 100 mL IVPB        1 g 200 mL/hr over 30 Minutes Intravenous  Once 04/19/23 1806 04/19/23 2015        Assessment/Plan Ileus vs possible sbo - CT 3/13 w/ mildly dilated small bowel loops w/ possible transition in the RLQ w/ collapsed distal small bowel in the RLQ. Hx of ex lap and SBR in 01/2022 by Dr. Robyne Peers for SBO.  - Given his current situation with his recent stent placement (3/11), infection, and now renal hemorrhage, it is possible this is a reactive ileus secondary to these issues, however, as he has had prior abdominal surgery this could be a SBO secondary to adhesive disease.  - Keep K >=4, Phos >= 3, Mg >= 2 and mobilize for bowel function - contrast in colon, NGT out and tolerating CLD.  -adv to FLD today -LFTs up some and primary has ordered an [place] of liver.  I suspect some of this is elevation from reabsorption of blood from the kidney, but will monitor.  He is not tender for me on exam today.   FEN - FLD after [place] VTE - SCDs, per primary ID - Cefepime now changed to Merrem for UTI/Pyelonephritis. Afebrile.   Foley - Per urology.   I reviewed nursing notes, last 24 h vitals and pain scores, last 48 h intake and output, last 24 h labs and trends, and last 24 h imaging results.    LOS: 8 days    Letha Cape, [facility] 04/27/2023, 8:56 AM Please see Amion for pager number during day hours 7:00am-4:30pm

## 2023-04-27 NOTE — Progress Notes (Signed)
 PROGRESS NOTE  Luke Foster ZOX:[id number] DOB: Jul 26, 1950 DOA: 04/19/2023 PCP: Elfredia Nevins, MD   LOS: 8 days   Brief Narrative / Interim history: 73 year old male with dementia, lives at home, neck pain status post spinal cord stimulator, hyperlipidemia, neurogenic bladder with indwelling Foley, HTN who comes into the hospital with flank pain, found to have nephrolithiasis.  Urology was consulted, he underwent cystoscopy, pyelogram, right stent insertion by Dr. Heide Scales on 3/11, and hospital course complicated by small bowel obstruction on 3/13, general surgery was consulted  Subjective / 24h Interval events: Abdomen feels much better.  Does complain of some "soreness" in the right upper side.  No nausea or vomiting and has been able to eat well  Assesement and Plan: Principal Problem:   Pyelonephritis Active Problems:   Neuropathy   Chronic pain syndrome   Status post insertion of spinal cord stimulator   Hypertension   Degenerative disc disease, lumbar   Urinary retention   Obstructive uropathy   Anxiety   Dementia (HCC)  Principal problem Right-sided hydronephrosis, nephrolithiasis, pyelonephritis -urine cultures from admission growing Enterobacter.  Patient has been placed on meropenem, continue for now.  Will have definitive stone removal on Friday.  Discussed with urology regarding whether this could be done earlier since his small bowel obstruction is now resolved, however he will still need to stay for Friday given scheduling issues -Probably best to stay on antibiotics till then -Discussed with the patient and brother at bedside, they do plan to take him home after staying in the hospital and have significant difficulties with transporting, so discharging home for 1 to 2 days for him to be brought back is quite complicated, recommend to keep him here until procedure can be finalized on Friday  Active problems Small bowel obstruction -General Surgery consulted and  following, this appears to be improved now.  He is having bowel movements, advance diet to full liquids per surgery and continue to closely monitor.  NG tube was removed 3/17  Elevated LFTs -slightly worse in the past couple of days, in the 200 range today.  He does have slight tenderness in the right upper quadrant, obtain ultrasound to better evaluate.  This was discussed with surgery as well  Anemia-of chronic disease, hemoglobin slightly lower but no frank bleeding and white count is also down, query dilutional component.  Monitor  Hypokalemia-replace potassium  Chronic back pain-continue home oxycodone, he is on chronic narcotics  Chronic urinary retention-currently has a Foley, continue.  Urology following  Dementia-Brother is a primary caretaker  Essential hypertension-blood pressure overall stable  Acute kidney injury-resolved  Moderate hemorrhage in the right renal cyst -watch per urology  Scheduled Meds:  Chlorhexidine Gluconate Cloth  6 each Topical Daily   polyethylene glycol  17 g Oral BID   senna-docusate  1 tablet Oral BID   Continuous Infusions:  sodium chloride 125 mL/hr at 04/27/23 0807   meropenem (MERREM) IV 1 g (04/27/23 0446)   PRN Meds:.bisacodyl, hydrALAZINE, HYDROmorphone (DILAUDID) injection, hyoscyamine, iohexol, lidocaine, methocarbamol (ROBAXIN) injection **OR** methocarbamol, ondansetron **OR** ondansetron (ZOFRAN) IV, oxyCODONE, rOPINIRole  Current Outpatient Medications  Medication Instructions   acetaminophen (TYLENOL) 1,000 mg, Oral, Every 6 hours PRN   ascorbic acid (VITAMIN C) 500 mg, Oral, Daily   cyanocobalamin (VITAMIN B12) 1,000 mcg, Oral, 2 times daily   Dilt-XR 120 mg, Oral, Daily   docusate sodium (COLACE) 100 mg, Oral, 3 times daily   enalapril (VASOTEC) 5 mg, Oral, 2 times daily   lidocaine (LIDODERM)  5 % 1 patch, Transdermal, Daily PRN   mirabegron ER (MYRBETRIQ) 50 mg, Oral, Daily   oxyCODONE (ROXICODONE) 15 mg, Oral, Every 6  hours PRN   polyethylene glycol (MIRALAX / GLYCOLAX) 17 g, Oral, 2 times daily   rOPINIRole (REQUIP) 0.5 mg, At bedtime PRN   solifenacin (VESICARE) 5 mg, Daily   sulfamethoxazole-trimethoprim (BACTRIM) 400-80 MG tablet 1 tablet, Oral, Daily at bedtime   tamsulosin (FLOMAX) 0.4 mg, Oral, Every evening   venlafaxine XR (EFFEXOR-XR) 75 mg, Oral, 2 times daily   Vibegron (GEMTESA PO) 50 mg, Daily at bedtime   Vitamin D 2,000 Units, Oral, Daily    Diet Orders (From admission, onward)     Start     Ordered   04/27/23 0937  Diet full liquid Room service appropriate? Yes; Fluid consistency: Thin  Diet effective now       Question Answer Comment  Room service appropriate? Yes   Fluid consistency: Thin      04/27/23 0936            DVT prophylaxis: SCDs Start: 04/19/23 2245   Lab Results  Component Value Date   PLT 467 (H) 04/27/2023      Code Status: Full Code  Family Communication: His brother is at bedside  Status is: Inpatient  Remains inpatient appropriate because: SBO  Level of care: Med-Surg  Consultants:  Urology General Surgery  Objective: Vitals:   04/26/23 0508 04/26/23 1402 04/26/23 2040 04/27/23 0632  BP: 136/78 129/81 (!) 141/74 137/73  Pulse: 68 81 83 79  Resp: 20 18 18 17   Temp: 98.5 F (36.9 C) 98.1 F (36.7 C) 99.3 F (37.4 C) 98.7 F (37.1 C)  TempSrc: Oral Oral Oral Oral  SpO2: 98% 100% 100% 100%  Weight:      Height:        Intake/Output Summary (Last 24 hours) at 04/27/2023 1042 Last data filed at 04/27/2023 0953 Gross per 24 hour  Intake 7711.4 ml  Output 1250 ml  Net 6461.4 ml   Wt Readings from Last 3 Encounters:  04/19/23 81.6 kg  12/28/22 90.7 kg  12/23/22 90.7 kg    Examination:  Constitutional: NAD Eyes: lids and conjunctivae normal, no scleral icterus ENMT: mmm Neck: normal, supple Respiratory: clear to auscultation bilaterally, no wheezing, no crackles. Cardiovascular: Regular rate and rhythm, no murmurs / rubs  / gallops. Abdomen: soft, no distention, no tenderness. Bowel sounds positive.   Data Reviewed: I have independently reviewed following labs and imaging studies   CBC Recent Labs  Lab 04/23/23 0439 04/24/23 0540 04/25/23 0454 04/26/23 0411 04/27/23 0437  WBC 19.4* 20.9* 17.0* 13.2* 11.0*  HGB 9.5* 9.9* 8.4* 8.5* 7.8*  HCT 29.7* 31.4* 27.8* 26.8* 24.3*  PLT 409* 404* 453* 462* 467*  MCV 96.1 99.4 100.0 99.3 97.2  MCH 30.7 31.3 30.2 31.5 31.2  MCHC 32.0 31.5 30.2 31.7 32.1  RDW 13.7 13.6 13.8 14.0 14.3    Recent Labs  Lab 04/21/23 0522 04/21/23 0525 04/23/23 0439 04/24/23 0540 04/25/23 0454 04/26/23 0411 04/27/23 0437  NA  --    < > 134* 140 142 145 143  K  --    < > 3.9 3.8 3.6 3.5 3.3*  CL  --    < > 100 104 108 113* 111  CO2  --    < > 24 25 25 23 23   GLUCOSE  --    < > 116* 120* 109* 111* 116*  BUN  --    < >  34* 34* 35* 32* 31*  CREATININE  --    < > 1.20 1.23 1.04 0.98 1.00  CALCIUM  --    < > 8.8* 9.1 8.7* 8.5* 8.5*  AST  --   --   --   --   --  111* 206*  ALT  --   --   --   --   --  217* 224*  ALKPHOS  --   --   --   --   --  100 92  BILITOT  --   --   --   --   --  1.5* 1.5*  ALBUMIN  --   --   --   --   --  2.0* 2.1*  MG  --   --   --  2.2 2.1 2.1 2.0  TSH 0.104*  --   --   --   --   --   --    < > = values in this interval not displayed.    ------------------------------------------------------------------------------------------------------------------ No results for input(s): "CHOL", "HDL", "LDLCALC", "TRIG", "CHOLHDL", "LDLDIRECT" in the last 72 hours.  No results found for: "HGBA1C" ------------------------------------------------------------------------------------------------------------------ No results for input(s): "TSH", "T4TOTAL", "T3FREE", "THYROIDAB" in the last 72 hours.  Invalid input(s): "FREET3"  Cardiac Enzymes No results for input(s): "CKMB", "TROPONINI", "MYOGLOBIN" in the last 168 hours.  Invalid input(s):  "CK" ------------------------------------------------------------------------------------------------------------------    Component Value Date/Time   BNP 31.2 06/17/2020 1255    CBG: Recent Labs  Lab 04/26/23 0738  GLUCAP 112*    Recent Results (from the past 240 hours)  Urine Culture     Status: Abnormal   Collection Time: 04/19/23  4:23 PM   Specimen: Urine, Random  Result Value Ref Range Status   Specimen Description   Final    URINE, RANDOM Performed at [facility], [address], [place], [place] [address]    Special Requests   Final    NONE Reflexed from [id number] Performed at [facility], [address], [place], [place] [address]    Culture 20,000 COLONIES/mL ENTEROBACTER CLOACAE (A)  Final   Report Status 04/21/2023 FINAL  Final   Organism ID, Bacteria ENTEROBACTER CLOACAE (A)  Final      Susceptibility   Enterobacter cloacae - MIC*    CEFEPIME 1 SENSITIVE Sensitive     CIPROFLOXACIN 1 RESISTANT Resistant     GENTAMICIN <=1 SENSITIVE Sensitive     IMIPENEM 1 SENSITIVE Sensitive     NITROFURANTOIN 128 RESISTANT Resistant     TRIMETH/SULFA <=20 SENSITIVE Sensitive     PIP/TAZO 64 INTERMEDIATE Intermediate ug/mL    * 20,000 COLONIES/mL ENTEROBACTER CLOACAE  Culture, blood (Routine X 2) w Reflex to ID Panel     Status: None   Collection Time: 04/20/23  8:23 AM   Specimen: BLOOD RIGHT HAND  Result Value Ref Range Status   Specimen Description   Final    BLOOD RIGHT HAND Performed at [facility], [address], [place], [place] [address]    Special Requests   Final    BOTTLES DRAWN AEROBIC ONLY Blood Culture results may not be optimal due to an inadequate volume of blood received in culture bottles Performed at [facility], [address] [place], [place] [address]    Culture   Final    NO GROWTH 5 DAYS Performed at [facility], [address], [place], [place] [address]    Report Status 04/25/2023 FINAL  Final  Culture,  blood (Routine X 2) w Reflex to ID Panel     Status: None   Collection Time: 04/20/23  8:23 AM   Specimen: BLOOD RIGHT ARM  Result Value Ref Range Status   Specimen Description   Final    BLOOD RIGHT ARM Performed at [facility], [address] [place], [place] [address]    Special Requests   Final    BOTTLES DRAWN AEROBIC ONLY Blood Culture results may not be optimal due to an inadequate volume of blood received in culture bottles Performed at [facility], [address], [place], [place] [address]    Culture   Final    NO GROWTH 5 DAYS Performed at [facility], [address] [place], [place] [address]    Report Status 04/25/2023 FINAL  Final     Radiology Studies: No results found.    Pamella Pert, MD, PhD [facility]  Between 7 am - 7 pm I am available, please contact me via Amion (for emergencies) or Securechat (non urgent messages)  Between 7 pm - 7 am I am not available, please contact night coverage MD/APP via Amion

## 2023-04-28 DIAGNOSIS — N12 Tubulo-interstitial nephritis, not specified as acute or chronic: Secondary | ICD-10-CM | POA: Diagnosis not present

## 2023-04-28 LAB — CBC
HCT: 25.2 % — ABNORMAL LOW (ref 39.0–52.0)
Hemoglobin: 7.7 g/dL — ABNORMAL LOW (ref 13.0–17.0)
MCH: 30.7 pg (ref 26.0–34.0)
MCHC: 30.6 g/dL (ref 30.0–36.0)
MCV: 100.4 fL — ABNORMAL HIGH (ref 80.0–100.0)
Platelets: 430 10*3/uL — ABNORMAL HIGH (ref 150–400)
RBC: 2.51 MIL/uL — ABNORMAL LOW (ref 4.22–5.81)
RDW: 14.4 % (ref 11.5–15.5)
WBC: 13.1 10*3/uL — ABNORMAL HIGH (ref 4.0–10.5)
nRBC: 0 % (ref 0.0–0.2)

## 2023-04-28 LAB — COMPREHENSIVE METABOLIC PANEL
ALT: 177 U/L — ABNORMAL HIGH (ref 0–44)
AST: 110 U/L — ABNORMAL HIGH (ref 15–41)
Albumin: 1.9 g/dL — ABNORMAL LOW (ref 3.5–5.0)
Alkaline Phosphatase: 85 U/L (ref 38–126)
Anion gap: 9 (ref 5–15)
BUN: 25 mg/dL — ABNORMAL HIGH (ref 8–23)
CO2: 21 mmol/L — ABNORMAL LOW (ref 22–32)
Calcium: 8.1 mg/dL — ABNORMAL LOW (ref 8.9–10.3)
Chloride: 113 mmol/L — ABNORMAL HIGH (ref 98–111)
Creatinine, Ser: 0.99 mg/dL (ref 0.61–1.24)
GFR, Estimated: >60 mL/min (ref >60)
Glucose, Bld: 109 mg/dL — ABNORMAL HIGH (ref 70–99)
Potassium: 3.1 mmol/L — ABNORMAL LOW (ref 3.5–5.1)
Sodium: 143 mmol/L (ref 135–145)
Total Bilirubin: 1.1 mg/dL (ref 0.0–1.2)
Total Protein: 5.8 g/dL — ABNORMAL LOW (ref 6.5–8.1)

## 2023-04-28 LAB — MAGNESIUM: Magnesium: 1.9 mg/dL (ref 1.7–2.4)

## 2023-04-28 LAB — PHOSPHORUS: Phosphorus: 3 mg/dL (ref 2.5–4.6)

## 2023-04-28 MED ORDER — POTASSIUM CHLORIDE CRYS ER 20 MEQ PO TBCR
40.0000 meq | EXTENDED_RELEASE_TABLET | Freq: Once | ORAL | Status: AC
  Administered 2023-04-28: 40 meq via ORAL
  Filled 2023-04-28: qty 2

## 2023-04-28 MED ORDER — POTASSIUM CHLORIDE 10 MEQ/100ML IV SOLN
10.0000 meq | INTRAVENOUS | Status: AC
  Administered 2023-04-28 (×4): 10 meq via INTRAVENOUS
  Filled 2023-04-28 (×4): qty 100

## 2023-04-28 MED ORDER — VENLAFAXINE HCL ER 37.5 MG PO CP24
75.0000 mg | ORAL_CAPSULE | Freq: Every day | ORAL | Status: DC
  Administered 2023-04-29 – 2023-05-01 (×2): 75 mg via ORAL
  Filled 2023-04-28 (×3): qty 2

## 2023-04-28 MED ORDER — ACETAMINOPHEN 500 MG PO TABS
1000.0000 mg | ORAL_TABLET | Freq: Four times a day (QID) | ORAL | Status: DC | PRN
  Administered 2023-04-28 – 2023-04-30 (×3): 1000 mg via ORAL
  Filled 2023-04-28 (×3): qty 2

## 2023-04-28 MED ORDER — DILTIAZEM HCL ER COATED BEADS 120 MG PO CP24
120.0000 mg | ORAL_CAPSULE | Freq: Every day | ORAL | Status: DC
  Administered 2023-04-29 – 2023-05-01 (×2): 120 mg via ORAL
  Filled 2023-04-28 (×3): qty 1

## 2023-04-28 NOTE — Progress Notes (Signed)
 PROGRESS NOTE    Luke Foster  YQM:[id number] DOB: [birth year] DOA: 04/19/2023 PCP: Elfredia Nevins, MD   Brief Narrative: 73 year old with past medical history significant for dementia, lives at home, neck pain spinal cord stimulator, hyperlipidemia, neurogenic bladder with indwelling Foley catheter, hypertension presents complaining of right-sided flank pain found to have nephrolithiasis.  Urology consulted, underwent cystoscopy, pyelogram, right stent insertion by Dr. Heide Scales on 3 /11, hospital course complicated by SVA 3/13, general surgery was consulted.  Ultrasound right upper quadrant was ordered for abdominal pain finding with mild gallbladder wall thickening unable to rule out cholecystitis.  Developed abdominal pain this afternoon, general surgery is ordering HIDA scan.    Assessment & Plan:   Principal Problem:   Pyelonephritis Active Problems:   Neuropathy   Chronic pain syndrome   Status post insertion of spinal cord stimulator   Hypertension   Degenerative disc disease, lumbar   Urinary retention   Obstructive uropathy   Anxiety   Dementia (HCC)   1-Right side hydronephrosis, nephrolithiasis, pyelonephritis: Patient presented with right-sided flank pain, urine culture grew Enterobacter. Patient was started on meropenem. Urology is planning definitive treatment for nephrolithiasis on Friday, if  stable Currently on Bactrim  SBO: General surgery consulted and following Had a bowel movement yesterday, passing gas, tolerating full liquid diet NG tube was removed 3/17  Transaminases: Worse over the last couple of days.  Patient was also having some abdominal pain and right upper quadrant tenderness Ultrasound showed mild gallbladder wall thickening cannot rule out cholecystitis Developed worsening pain in the afternoon 03/19, plan to proceed with HIDA scan  Anemia of chronic disease, continue to monitor hemoglobin  Hypokalemia: Replete IV and orally  Chronic  back pain: Continue with oxycodone, he is on chronic narcotics  Chronic urinary retention: Continue with Foley catheter.  Urology following  Dementia: Brother is primary caretaker Resume Effexor.   Essential hypertension Holding Vasotec due to AKI on admission.   Resume diltiazem.   AKI resolved   Moderate hemorrhage in the right renal cyst -watch per urology   Estimated body mass index is 27.37 kg/m as calculated from the following:   Height as of this encounter: 5\' 8"  (1.727 m).   Weight as of this encounter: 81.6 kg.   DVT prophylaxis: SCDs Code Status: Full code Family Communication: Brother at bedside Disposition Plan:  Status is: Inpatient Remains inpatient appropriate because: management of UTI, SBO    Consultants:  Surgery Urology  Procedures:  Cystoscopy, stent placement.   Antimicrobials:    Subjective: He is alert, passing gas. Had BM yesterday. Tolerating full liquid.  Diet advanced to soft, had few bites of hamburger. Feels abdomen is less distended.   Objective: Vitals:   04/27/23 0632 04/27/23 1350 04/27/23 2019 04/28/23 0556  BP: 137/73 (!) 118/98 (!) 146/73 125/78  Pulse: 79 91 83 78  Resp: 17 17 16 15   Temp: 98.7 F (37.1 C) 98 F (36.7 C) 98.5 F (36.9 C) 98.7 F (37.1 C)  TempSrc: Oral Oral Oral Oral  SpO2: 100% 100% 98% 100%  Weight:      Height:        Intake/Output Summary (Last 24 hours) at 04/28/2023 5284 Last data filed at 04/28/2023 0600 Gross per 24 hour  Intake 3021.98 ml  Output 1600 ml  Net 1421.98 ml   Filed Weights   04/19/23 1457  Weight: 81.6 kg    Examination:  General exam: Appears calm and comfortable  Respiratory system: Clear to auscultation. Respiratory  effort normal. Cardiovascular system: S1 & S2 heard, RRR. No JVD, murmurs, rubs, gallops or clicks. No pedal edema. Gastrointestinal system: Abdomen is distended, soft and nontender. Central nervous system: Alert and oriented.  Extremities:  Symmetric 5 x 5 power.    Data Reviewed: I have personally reviewed following labs and imaging studies  CBC: Recent Labs  Lab 04/24/23 0540 04/25/23 0454 04/26/23 0411 04/27/23 0437 04/28/23 0503  WBC 20.9* 17.0* 13.2* 11.0* 13.1*  HGB 9.9* 8.4* 8.5* 7.8* 7.7*  HCT 31.4* 27.8* 26.8* 24.3* 25.2*  MCV 99.4 100.0 99.3 97.2 100.4*  PLT 404* 453* 462* 467* 430*   Basic Metabolic Panel: Recent Labs  Lab 04/24/23 0540 04/25/23 0454 04/26/23 0411 04/27/23 0437 04/28/23 0503  NA 140 142 145 143 143  K 3.8 3.6 3.5 3.3* 3.1*  CL 104 108 113* 111 113*  CO2 25 25 23 23  21*  GLUCOSE 120* 109* 111* 116* 109*  BUN 34* 35* 32* 31* 25*  CREATININE 1.23 1.04 0.98 1.00 0.99  CALCIUM 9.1 8.7* 8.5* 8.5* 8.1*  MG 2.2 2.1 2.1 2.0 1.9  PHOS 3.3  --  2.8 3.1 3.0   GFR: Estimated Creatinine Clearance: 66.2 mL/min (by C-G formula based on SCr of 0.99 mg/dL). Liver Function Tests: Recent Labs  Lab 04/26/23 0411 04/27/23 0437 04/28/23 0503  AST 111* 206* 110*  ALT 217* 224* 177*  ALKPHOS 100 92 85  BILITOT 1.5* 1.5* 1.1  PROT 6.5 6.1* 5.8*  ALBUMIN 2.0* 2.1* 1.9*   No results for input(s): "LIPASE", "AMYLASE" in the last 168 hours. No results for input(s): "AMMONIA" in the last 168 hours. Coagulation Profile: No results for input(s): "INR", "PROTIME" in the last 168 hours. Cardiac Enzymes: No results for input(s): "CKTOTAL", "CKMB", "CKMBINDEX", "TROPONINI" in the last 168 hours. BNP (last 3 results) No results for input(s): "PROBNP" in the last 8760 hours. HbA1C: No results for input(s): "HGBA1C" in the last 72 hours. CBG: Recent Labs  Lab 04/26/23 0738  GLUCAP 112*   Lipid Profile: No results for input(s): "CHOL", "HDL", "LDLCALC", "TRIG", "CHOLHDL", "LDLDIRECT" in the last 72 hours. Thyroid Function Tests: No results for input(s): "TSH", "T4TOTAL", "FREET4", "T3FREE", "THYROIDAB" in the last 72 hours. Anemia Panel: No results for input(s): "VITAMINB12", "FOLATE",  "FERRITIN", "TIBC", "IRON", "RETICCTPCT" in the last 72 hours. Sepsis Labs: No results for input(s): "PROCALCITON", "LATICACIDVEN" in the last 168 hours.  Recent Results (from the past 240 hours)  Urine Culture     Status: Abnormal   Collection Time: 04/19/23  4:23 PM   Specimen: Urine, Random  Result Value Ref Range Status   Specimen Description   Final    URINE, RANDOM Performed at [facility], [address], [place], [place] [address]    Special Requests   Final    NONE Reflexed from [id number] Performed at [facility], [address], [place], [place] [address]    Culture 20,000 COLONIES/mL ENTEROBACTER CLOACAE (A)  Final   Report Status 04/21/2023 FINAL  Final   Organism ID, Bacteria ENTEROBACTER CLOACAE (A)  Final      Susceptibility   Enterobacter cloacae - MIC*    CEFEPIME 1 SENSITIVE Sensitive     CIPROFLOXACIN 1 RESISTANT Resistant     GENTAMICIN <=1 SENSITIVE Sensitive     IMIPENEM 1 SENSITIVE Sensitive     NITROFURANTOIN 128 RESISTANT Resistant     TRIMETH/SULFA <=20 SENSITIVE Sensitive     PIP/TAZO 64 INTERMEDIATE Intermediate ug/mL    * 20,000 COLONIES/mL ENTEROBACTER CLOACAE  Culture, blood (Routine X 2) w Reflex to ID Panel     Status: None   Collection Time: 04/20/23  8:23 AM   Specimen: BLOOD RIGHT HAND  Result Value Ref Range Status   Specimen Description   Final    BLOOD RIGHT HAND Performed at [facility], [address], [place], [place] [address]    Special Requests   Final    BOTTLES DRAWN AEROBIC ONLY Blood Culture results may not be optimal due to an inadequate volume of blood received in culture bottles Performed at [facility], [address], [place], [place] [address]    Culture   Final    NO GROWTH 5 DAYS Performed at [facility], [address] [place], [place] [address]    Report Status 04/25/2023 FINAL  Final  Culture, blood (Routine X 2) w Reflex to ID Panel     Status: None   Collection Time: 04/20/23   8:23 AM   Specimen: BLOOD RIGHT ARM  Result Value Ref Range Status   Specimen Description   Final    BLOOD RIGHT ARM Performed at [facility], [address] [place], [place] [address]    Special Requests   Final    BOTTLES DRAWN AEROBIC ONLY Blood Culture results may not be optimal due to an inadequate volume of blood received in culture bottles Performed at [facility], [address] [place], [place] [address]    Culture   Final    NO GROWTH 5 DAYS Performed at [facility], [address] [place], [place] [address]    Report Status 04/25/2023 FINAL  Final         Radiology Studies: US Abdomen Limited RUQ (LIVER/GB) Result Date: 04/27/2023 CLINICAL DATA:  Elevated liver function tests EXAM: ULTRASOUND ABDOMEN LIMITED RIGHT UPPER QUADRANT COMPARISON:  Noncontrast CT 04/22/2023. FINDINGS: Gallbladder: Distended gallbladder. Sludge and stones are identified. Also some slight wall thickening of 4 mm. No reported sonographic Murphy's sign. Common bile duct: Diameter: 4 mm Liver: No focal lesion identified. Within normal limits in parenchymal echogenicity. Portal vein is patent on color Doppler imaging with normal direction of blood flow towards the liver. Other: Small right pleural effusion. IMPRESSION: Distended gallbladder with sludge and stones. Slight wall thickening as well but no ductal dilatation or adjacent fluid. Please correlate with clinical presentation of acute cholecystitis and if needed further workup as clinically appropriate such as HIDA scan. Small right pleural effusion. Electronically Signed   By: Karen Kays M.D.   On: 04/27/2023 12:05        Scheduled Meds:  Chlorhexidine Gluconate Cloth  6 each Topical Daily   cyanocobalamin  1,000 mcg Oral Daily   polyethylene glycol  17 g Oral BID   senna-docusate  1 tablet Oral BID   sulfamethoxazole-trimethoprim  1 tablet Oral Q12H   tamsulosin  0.4 mg Oral QPM   Continuous  Infusions:  sodium chloride 125 mL/hr at 04/28/23 0715   potassium chloride       LOS: 9 days    Time spent: 35 minutes    Samanvitha Germany A Kyran Whittier, MD [facility]   If 7PM-7AM, please contact night-coverage [email or web address]  04/28/2023, 8:22 AM

## 2023-04-28 NOTE — Progress Notes (Signed)
 Patient developed RUQ abdominal pain this afternoon. Was not related to PO intake and was able to eat a few bites of a burger earlier without symptoms. No n/v. He is ttp in the RUQ on exam. Given this and the concern for possible cholecystitis on [place] will get HIDA scan to confirm vs r/o Cholecystitis. Updated primary team. Updated patient, RN and HCPOA at bedside.   Jacinto Halim, PA-C

## 2023-04-28 NOTE — Progress Notes (Signed)
 8 Days Post-Op  Subjective: CC: Patient's brother at bedside.  Patient reports no abdominal pain at rest.  He is tolerating FLD without abdominal pain, nausea or vomiting.  Passing flatus.  Last BM yesterday morning.  Afebrile. No tachycardia or hypotension. WBC 13.1 (11). Alk Phos 85 (92), AST 110 (206), ALT 177 (224), T. Bili 1.1 (1.5). K 3.1. Phos 3. Mg 1.9. RUQ [place] w/ Distended gallbladder with sludge, stones and slight wall thickening at 4mm. CBD 4mm.   Objective: Vital signs in last 24 hours: Temp:  [98 F (36.7 C)-98.7 F (37.1 C)] 98.7 F (37.1 C) (03/19 0556) Pulse Rate:  [78-91] 78 (03/19 0556) Resp:  [15-17] 15 (03/19 0556) BP: (118-146)/(73-98) 125/78 (03/19 0556) SpO2:  [98 %-100 %] 100 % (03/19 0556) Last BM Date : 04/27/23  Intake/Output from previous day: 03/18 0701 - 03/19 0700 In: 3022 [P.O.:810; I.V.:2212] Out: 1600 [Urine:1600] Intake/Output this shift: No intake/output data recorded.  PE: Gen:  Alert, NAD, pleasant Abd: Soft, ND, NT, neg murphy's sign. +BS  Lab Results:  Recent Labs    04/27/23 0437 04/28/23 0503  WBC 11.0* 13.1*  HGB 7.8* 7.7*  HCT 24.3* 25.2*  PLT 467* 430*   BMET Recent Labs    04/27/23 0437 04/28/23 0503  NA 143 143  K 3.3* 3.1*  CL 111 113*  CO2 23 21*  GLUCOSE 116* 109*  BUN 31* 25*  CREATININE 1.00 0.99  CALCIUM 8.5* 8.1*   PT/INR No results for input(s): "LABPROT", "INR" in the last 72 hours. CMP     Component Value Date/Time   NA 143 04/28/2023 0503   K 3.1 (L) 04/28/2023 0503   CL 113 (H) 04/28/2023 0503   CO2 21 (L) 04/28/2023 0503   GLUCOSE 109 (H) 04/28/2023 0503   BUN 25 (H) 04/28/2023 0503   CREATININE 0.99 04/28/2023 0503   CALCIUM 8.1 (L) 04/28/2023 0503   PROT 5.8 (L) 04/28/2023 0503   ALBUMIN 1.9 (L) 04/28/2023 0503   AST 110 (H) 04/28/2023 0503   ALT 177 (H) 04/28/2023 0503   ALKPHOS 85 04/28/2023 0503   BILITOT 1.1 04/28/2023 0503   GFRNONAA >60 04/28/2023 0503   GFRAA >60  04/26/2019 1721   Lipase     Component Value Date/Time   LIPASE 34 06/17/2022 1323    Studies/Results: US Abdomen Limited RUQ (LIVER/GB) Result Date: 04/27/2023 CLINICAL DATA:  Elevated liver function tests EXAM: ULTRASOUND ABDOMEN LIMITED RIGHT UPPER QUADRANT COMPARISON:  Noncontrast CT 04/22/2023. FINDINGS: Gallbladder: Distended gallbladder. Sludge and stones are identified. Also some slight wall thickening of 4 mm. No reported sonographic Murphy's sign. Common bile duct: Diameter: 4 mm Liver: No focal lesion identified. Within normal limits in parenchymal echogenicity. Portal vein is patent on color Doppler imaging with normal direction of blood flow towards the liver. Other: Small right pleural effusion. IMPRESSION: Distended gallbladder with sludge and stones. Slight wall thickening as well but no ductal dilatation or adjacent fluid. Please correlate with clinical presentation of acute cholecystitis and if needed further workup as clinically appropriate such as HIDA scan. Small right pleural effusion. Electronically Signed   By: Karen Kays M.D.   On: 04/27/2023 12:05    Anti-infectives: Anti-infectives (From admission, onward)    Start     Dose/Rate Route Frequency Ordered Stop   04/27/23 1230  sulfamethoxazole-trimethoprim (BACTRIM DS) 800-160 MG per tablet 1 tablet        1 tablet Oral Every 12 hours 04/27/23 1135  04/24/23 1130  meropenem (MERREM) 1 g in sodium chloride 0.9 % 100 mL IVPB  Status:  Discontinued        1 g 200 mL/hr over 30 Minutes Intravenous Every 8 hours 04/24/23 1028 04/27/23 1135   04/23/23 1115  piperacillin-tazobactam (ZOSYN) IVPB 3.375 g  Status:  Discontinued        3.375 g 12.5 mL/hr over 240 Minutes Intravenous Every 8 hours 04/23/23 1016 04/24/23 1028   04/21/23 1330  ceFEPIme (MAXIPIME) 2 g in sodium chloride 0.9 % 100 mL IVPB  Status:  Discontinued        2 g 200 mL/hr over 30 Minutes Intravenous Every 12 hours 04/21/23 1242 04/23/23 0926    04/20/23 1800  cefTRIAXone (ROCEPHIN) 1 g in sodium chloride 0.9 % 100 mL IVPB  Status:  Discontinued        1 g 200 mL/hr over 30 Minutes Intravenous Every 24 hours 04/19/23 2244 04/21/23 1231   04/19/23 1815  cefTRIAXone (ROCEPHIN) 1 g in sodium chloride 0.9 % 100 mL IVPB        1 g 200 mL/hr over 30 Minutes Intravenous  Once 04/19/23 1806 04/19/23 2015        Assessment/Plan Ileus vs possible sbo - CT 3/13 w/ mildly dilated small bowel loops w/ possible transition in the RLQ w/ collapsed distal small bowel in the RLQ. Hx of ex lap and SBR in 01/2022 by Dr. Robyne Peers for SBO.  - Given his current situation with his recent stent placement (3/11), infection, and now renal hemorrhage, it is possible this is a reactive ileus secondary to these issues, however, as he has had prior abdominal surgery this could be a SBO secondary to adhesive disease.  - Keep K >=4, Phos >= 3, Mg >= 2 and mobilize for bowel function - contrast in colon, NGT out and tolerating CLD.  -adv to FLD today  Cholelithiasis w/ Elevated LFT's - RUQ [place] w/ Distended gallbladder with sludge, stones and slight wall thickening at 4mm. CBD 4mm.  - LFT's downtrending today w/ normal T. Bili.  - He is completely NT on exam today with negative murphy's sign. He denies post prandial abdominal pain. My suspicion for Cholecystitis is low at this time. I agree that elevated LFT's are likely from reabsorption of blood from the kidney, but will monitor.  - AM labs.  - If patient develops any recurrent abdominal pain, n/v or labs worsen tomorrow, consider HIDA   FEN - Soft diet. Replace K. IVF per primary  VTE - SCDs, okay for chem ppx from our standpoint ID - Bactrim for UTI/Pyelonephritis. Afebrile.   Foley - Per urology.   I reviewed nursing notes, last 24 h vitals and pain scores, last 48 h intake and output, last 24 h labs and trends, and last 24 h imaging results.   LOS: 9 days    Jacinto Halim, [facility]  Surgery 04/28/2023, 9:11 AM Please see Amion for pager number during day hours 7:00am-4:30pm

## 2023-04-28 NOTE — Plan of Care (Signed)
   Problem: Clinical Measurements: Goal: Will remain free from infection Outcome: Progressing Goal: Diagnostic test results will improve Outcome: Progressing

## 2023-04-28 NOTE — Progress Notes (Signed)
 Pt received in bed, agreed to PT session. Pt completed B LE ROM with assist to complete due to weakness and edema. ModA to transfer to EOB with use of rails. Fair sitting balance at EOB for several minutes. Nursing in room to assist with IV pole and assist with transfer. Pt was able to stand from raised bed to RW with Mod/MinA to power up and attain upright standing balance prior to advancing several steps to bedside chair. Heavy reliance on RW, no buckling noted, nursing encouraged to use Corene Cornea if pt fatigued after sitting up in chair. Will continue to progress acutely per POC.   04/28/23 1500  PT Visit Information  Assistance Needed +2 (For safety)  History of Present Illness 73 year old male who presented from home with c/o worsening back pain; had fall ~ 1 wk prior to adm. CT head negative.  CT abdomen/pelvi showed moderate right hydronephrosis, hydroureter, 9 mm obstructing stone in the distal right ureter, additional 6 mm stone, possible pyelonephritis. Urology consulted, s/p right ureteral stent on 3/11.  PMH: chronic back pain, chronic Foley catheter for urinary retention, nephrolithiasis, hypertension, depression, dementia .  Subjective Data  Subjective I'm ready!  Patient Stated Goal Go home  Precautions  Precautions Fall  Recall of Precautions/Restrictions Intact  Restrictions  Weight Bearing Restrictions Per Provider Order No  Pain Assessment  Pain Assessment Faces  Faces Pain Scale 2  Pain Location R flank  Pain Descriptors / Indicators Aching  Pain Intervention(s) Monitored during session  Cognition  Arousal Alert  Behavior During Therapy WFL for tasks assessed/performed  PT - Cognitive impairments History of cognitive impairments  PT - Cognition Comments hx of dementia  Following Commands  Following commands Impaired  Following commands impaired Only follows one step commands consistently  Cueing  Cueing Techniques Verbal cues;Visual cues  Communication   Communication No apparent difficulties  Factors Affecting Communication Hearing impaired  Bed Mobility  Overal bed mobility Needs Assistance  Bed Mobility Supine to Sit  Supine to sit Mod assist;HOB elevated;Used rails  General bed mobility comments Min/ModA to help scoot to EOB  Transfers  Overall transfer level Needs assistance  Equipment used Rolling walker (2 wheels)  Transfers Sit to/from Stand  Sit to Stand Mod assist;Min assist;From elevated surface  General transfer comment Pt able to stand from elevated bed to RW with cues for technique and hand placement  Ambulation/Gait  Ambulation/Gait assistance Min assist  Gait Distance (Feet) 3 Feet  Assistive device Rolling walker (2 wheels)  Gait Pattern/deviations Step-to pattern;Decreased step length - right;Decreased step length - left  General Gait Details Short distance gait with RW to bedside chair. Shuffling steps due to fluid retention B LE's and peripheral neuropathy. Pt refused to don socks.  Gait velocity decr  Balance  Overall balance assessment Needs assistance;History of Falls  Sitting-balance support Feet supported;Single extremity supported  Sitting balance-Leahy Scale Fair  Standing balance support Bilateral upper extremity supported;Reliant on assistive device for balance;During functional activity  Standing balance-Leahy Scale Poor  Standing balance comment High fall risk due to weaknes and prolonged hospital stay  General Comments  General comments (skin integrity, edema, etc.) Significant fluid retention B LE's  Exercises  Exercises General Lower Extremity  General Exercises - Lower Extremity  Ankle Circles/Pumps AROM;Both;10 reps;Supine  Heel Slides AAROM;Both;5 reps;Supine  Hip ABduction/ADduction AAROM;Both;5 reps;Supine  PT - End of Session  Equipment Utilized During Treatment Gait belt  Activity Tolerance Patient tolerated treatment well  Patient left in chair;with call bell/phone  within reach;with  chair alarm set  Nurse Communication Mobility status;Other (comment) (Can use Corene Cornea if weak upon returning to bed)   PT - Assessment/Plan  PT Visit Diagnosis Other abnormalities of gait and mobility (R26.89)  PT Frequency (ACUTE ONLY) Min 2X/week  Follow Up Recommendations Skilled nursing-short term rehab (<3 hours/day)  Can patient physically be transported by private vehicle No  Patient can return home with the following Two people to help with walking and/or transfers;Two people to help with bathing/dressing/bathroom  PT equipment None recommended by PT  AM-PAC PT "6 Clicks" Mobility Outcome Measure (Version 2)  Help needed turning from your back to your side while in a flat bed without using bedrails? 2  Help needed moving from lying on your back to sitting on the side of a flat bed without using bedrails? 2  Help needed moving to and from a bed to a chair (including a wheelchair)? 2  Help needed standing up from a chair using your arms (e.g., wheelchair or bedside chair)? 2  Help needed to walk in hospital room? 2  Help needed climbing 3-5 steps with a railing?  1  6 Click Score 11  Consider Recommendation of Discharge To: CIR/SNF/LTACH  Progressive Mobility  What is the highest level of mobility based on the progressive mobility assessment? Level 4 (Walks with assist in room) - Balance while marching in place and cannot step forward and back - Complete  Activity Ambulated with assistance in room;Transferred from bed to chair  PT Goal Progression  Progress towards PT goals Progressing toward goals  PT Time Calculation  PT Start Time (ACUTE ONLY) 1303  PT Stop Time (ACUTE ONLY) 1338  PT Time Calculation (min) (ACUTE ONLY) 35 min  PT General Charges  $$ ACUTE PT VISIT 1 Visit  PT Treatments  $Therapeutic Exercise 8-22 mins  $Therapeutic Activity 8-22 mins  Zadie Cleverly, PTA 04/28/2023

## 2023-04-29 ENCOUNTER — Inpatient Hospital Stay (HOSPITAL_COMMUNITY)

## 2023-04-29 DIAGNOSIS — N12 Tubulo-interstitial nephritis, not specified as acute or chronic: Secondary | ICD-10-CM | POA: Diagnosis not present

## 2023-04-29 LAB — COMPREHENSIVE METABOLIC PANEL
ALT: 141 U/L — ABNORMAL HIGH (ref 0–44)
AST: 57 U/L — ABNORMAL HIGH (ref 15–41)
Albumin: 2.1 g/dL — ABNORMAL LOW (ref 3.5–5.0)
Alkaline Phosphatase: 81 U/L (ref 38–126)
Anion gap: 7 (ref 5–15)
BUN: 17 mg/dL (ref 8–23)
CO2: 18 mmol/L — ABNORMAL LOW (ref 22–32)
Calcium: 7.8 mg/dL — ABNORMAL LOW (ref 8.9–10.3)
Chloride: 111 mmol/L (ref 98–111)
Creatinine, Ser: 0.68 mg/dL (ref 0.61–1.24)
GFR, Estimated: >60 mL/min (ref >60)
Glucose, Bld: 83 mg/dL (ref 70–99)
Potassium: 3.5 mmol/L (ref 3.5–5.1)
Sodium: 136 mmol/L (ref 135–145)
Total Bilirubin: 1.1 mg/dL (ref 0.0–1.2)
Total Protein: 5.9 g/dL — ABNORMAL LOW (ref 6.5–8.1)

## 2023-04-29 LAB — CBC
HCT: 26.4 % — ABNORMAL LOW (ref 39.0–52.0)
Hemoglobin: 8 g/dL — ABNORMAL LOW (ref 13.0–17.0)
MCH: 30.7 pg (ref 26.0–34.0)
MCHC: 30.3 g/dL (ref 30.0–36.0)
MCV: 101.1 fL — ABNORMAL HIGH (ref 80.0–100.0)
Platelets: 429 10*3/uL — ABNORMAL HIGH (ref 150–400)
RBC: 2.61 MIL/uL — ABNORMAL LOW (ref 4.22–5.81)
RDW: 14.5 % (ref 11.5–15.5)
WBC: 13.3 10*3/uL — ABNORMAL HIGH (ref 4.0–10.5)
nRBC: 0 % (ref 0.0–0.2)

## 2023-04-29 MED ORDER — POTASSIUM CHLORIDE 10 MEQ/100ML IV SOLN
10.0000 meq | INTRAVENOUS | Status: DC
  Administered 2023-04-29 (×2): 10 meq via INTRAVENOUS
  Filled 2023-04-29 (×2): qty 100

## 2023-04-29 MED ORDER — POTASSIUM CHLORIDE 10 MEQ/100ML IV SOLN
10.0000 meq | INTRAVENOUS | Status: AC
  Administered 2023-04-29: 10 meq via INTRAVENOUS
  Filled 2023-04-29: qty 100

## 2023-04-29 MED ORDER — TECHNETIUM TC 99M MEBROFENIN IV KIT
5.2400 | PACK | Freq: Once | INTRAVENOUS | Status: AC
Start: 2023-04-29 — End: 2023-04-29
  Administered 2023-04-29: 5.24 via INTRAVENOUS

## 2023-04-29 NOTE — Progress Notes (Addendum)
 9 Days Post-Op  Subjective: CC: Abdominal pain resolved. No n/v overnight. Passing flatus. Last BM 3/18.   Afebrile. No tachycardia or hypotension. CBC and CMP pending.   Objective: Vital signs in last 24 hours: Temp:  [97.8 F (36.6 C)-99.3 F (37.4 C)] 98.7 F (37.1 C) (03/20 0535) Pulse Rate:  [77-82] 77 (03/20 0535) Resp:  [16-18] 18 (03/20 0535) BP: (143-144)/(72-86) 143/76 (03/20 0535) SpO2:  [100 %] 100 % (03/20 0535) Last BM Date : 04/27/23  Intake/Output from previous day: 03/19 0701 - 03/20 0700 In: 4798.7 [P.O.:1300; I.V.:3149.5; IV Piggyback:349.2] Out: 1950 [Urine:1950] Intake/Output this shift: No intake/output data recorded.  PE: Gen:  Alert, NAD, pleasant Abd: Soft, mild distension, NT, +BS  Lab Results:  Recent Labs    04/27/23 0437 04/28/23 0503  WBC 11.0* 13.1*  HGB 7.8* 7.7*  HCT 24.3* 25.2*  PLT 467* 430*   BMET Recent Labs    04/27/23 0437 04/28/23 0503  NA 143 143  K 3.3* 3.1*  CL 111 113*  CO2 23 21*  GLUCOSE 116* 109*  BUN 31* 25*  CREATININE 1.00 0.99  CALCIUM 8.5* 8.1*   PT/INR No results for input(s): "LABPROT", "INR" in the last 72 hours. CMP     Component Value Date/Time   NA 143 04/28/2023 0503   K 3.1 (L) 04/28/2023 0503   CL 113 (H) 04/28/2023 0503   CO2 21 (L) 04/28/2023 0503   GLUCOSE 109 (H) 04/28/2023 0503   BUN 25 (H) 04/28/2023 0503   CREATININE 0.99 04/28/2023 0503   CALCIUM 8.1 (L) 04/28/2023 0503   PROT 5.8 (L) 04/28/2023 0503   ALBUMIN 1.9 (L) 04/28/2023 0503   AST 110 (H) 04/28/2023 0503   ALT 177 (H) 04/28/2023 0503   ALKPHOS 85 04/28/2023 0503   BILITOT 1.1 04/28/2023 0503   GFRNONAA >60 04/28/2023 0503   GFRAA >60 04/26/2019 1721   Lipase     Component Value Date/Time   LIPASE 34 06/17/2022 1323    Studies/Results: US Abdomen Limited RUQ (LIVER/GB) Result Date: 04/27/2023 CLINICAL DATA:  Elevated liver function tests EXAM: ULTRASOUND ABDOMEN LIMITED RIGHT UPPER QUADRANT COMPARISON:   Noncontrast CT 04/22/2023. FINDINGS: Gallbladder: Distended gallbladder. Sludge and stones are identified. Also some slight wall thickening of 4 mm. No reported sonographic Murphy's sign. Common bile duct: Diameter: 4 mm Liver: No focal lesion identified. Within normal limits in parenchymal echogenicity. Portal vein is patent on color Doppler imaging with normal direction of blood flow towards the liver. Other: Small right pleural effusion. IMPRESSION: Distended gallbladder with sludge and stones. Slight wall thickening as well but no ductal dilatation or adjacent fluid. Please correlate with clinical presentation of acute cholecystitis and if needed further workup as clinically appropriate such as HIDA scan. Small right pleural effusion. Electronically Signed   By: Karen Kays M.D.   On: 04/27/2023 12:05    Anti-infectives: Anti-infectives (From admission, onward)    Start     Dose/Rate Route Frequency Ordered Stop   04/27/23 1230  sulfamethoxazole-trimethoprim (BACTRIM DS) 800-160 MG per tablet 1 tablet        1 tablet Oral Every 12 hours 04/27/23 1135     04/24/23 1130  meropenem (MERREM) 1 g in sodium chloride 0.9 % 100 mL IVPB  Status:  Discontinued        1 g 200 mL/hr over 30 Minutes Intravenous Every 8 hours 04/24/23 1028 04/27/23 1135   04/23/23 1115  piperacillin-tazobactam (ZOSYN) IVPB 3.375 g  Status:  Discontinued  3.375 g 12.5 mL/hr over 240 Minutes Intravenous Every 8 hours 04/23/23 1016 04/24/23 1028   04/21/23 1330  ceFEPIme (MAXIPIME) 2 g in sodium chloride 0.9 % 100 mL IVPB  Status:  Discontinued        2 g 200 mL/hr over 30 Minutes Intravenous Every 12 hours 04/21/23 1242 04/23/23 0926   04/20/23 1800  cefTRIAXone (ROCEPHIN) 1 g in sodium chloride 0.9 % 100 mL IVPB  Status:  Discontinued        1 g 200 mL/hr over 30 Minutes Intravenous Every 24 hours 04/19/23 2244 04/21/23 1231   04/19/23 1815  cefTRIAXone (ROCEPHIN) 1 g in sodium chloride 0.9 % 100 mL IVPB        1  g 200 mL/hr over 30 Minutes Intravenous  Once 04/19/23 1806 04/19/23 2015        Assessment/Plan Ileus vs possible sbo - CT 3/13 w/ mildly dilated small bowel loops w/ possible transition in the RLQ w/ collapsed distal small bowel in the RLQ. Hx of ex lap and SBR in 01/2022 by Dr. Robyne Peers for SBO.  - Given his current situation with his recent stent placement (3/11), infection, and renal hemorrhage, it is possible this is a reactive ileus secondary to these issues, however, as he has had prior abdominal surgery this could be a SBO secondary to adhesive disease.  - Keep K >=4, Phos >= 3, Mg >= 2 and mobilize for bowel function - Contrast in colon 3/17.  - NGT out. Was tolerating PO and having bowel function. Currently NPO for HIDA. After HIDA okay for FLD again > ADAT to soft. Cont bowel regimen. If has any recurrence of pain, n/v can get xray.   Cholelithiasis w/ Elevated LFT's - RUQ [place] w/ Distended gallbladder with sludge, stones and slight wall thickening at 4mm. CBD 4mm.  - LFT's downtrending yesterday w/ normal T. Bili. Repeat LFT's pending today.  - Elevated LFT's could be from reabsorption of blood from the kidney, but given US findings and recurrent RUQ pain 3/19 will get HIDA to r/o Cholecystitis. This is planned for today.   Updated the patients brother/HCPOA over speaker phone while I was in the room. Discussed care plan w/ RN in person.    FEN - NPO for HIDA. Diet recs as above. Will need to be NPO at midnight for urologic procedure scheduled for 3/21. IVF per primary  VTE - SCDs, okay for chem ppx from our standpoint ID - Bactrim for UTI/Pyelonephritis. Afebrile. WBC pending.  Foley - Per urology.   I reviewed nursing notes, last 24 h vitals and pain scores, last 48 h intake and output, last 24 h labs and trends, and last 24 h imaging results.   LOS: 10 days    Luke Foster, [facility] 04/29/2023, 8:42 AM Please see Amion for pager number during  day hours 7:00am-4:30pm

## 2023-04-29 NOTE — Progress Notes (Signed)
 PT Cancellation Note  Patient Details Name: RAFIK KOPPEL MRN: [id number] DOB: 1950/03/03   Cancelled Treatment:    Reason Eval/Treat Not Completed: Patient at procedure or test/unavailable. Pt off floor for HIDA scan. RN reports surgery scheduled for tomorrow. Will continue to follow for acute PT as able.   Domenick Bookbinder PT, DPT 04/29/23, 12:49 PM

## 2023-04-29 NOTE — Plan of Care (Signed)
   Problem: Clinical Measurements: Goal: Will remain free from infection Outcome: Progressing Goal: Diagnostic test results will improve Outcome: Progressing

## 2023-04-29 NOTE — Progress Notes (Signed)
 Eulah Pont, brother/POA of Luke Foster would like 24 hour notice if possible before discharge. He needs to arrange transportation as well as caregivers prior to discharge. Thanks

## 2023-04-29 NOTE — Progress Notes (Signed)
 PROGRESS NOTE    Luke Foster  ZOX:[id number] DOB: Feb 20, 1950 DOA: 04/19/2023 PCP: Elfredia Nevins, MD   Brief Narrative: 73 year old with past medical history significant for dementia, lives at home, neck pain spinal cord stimulator, hyperlipidemia, neurogenic bladder with indwelling Foley catheter, hypertension presents complaining of right-sided flank pain found to have nephrolithiasis.  Urology consulted, underwent cystoscopy, pyelogram, right stent insertion by Dr. Heide Scales on 3 /11, hospital course complicated by SVA 3/13, general surgery was consulted.  Ultrasound right upper quadrant was ordered for abdominal pain finding with mild gallbladder wall thickening unable to rule out cholecystitis.  Developed abdominal pain this afternoon, general surgery is ordering HIDA scan.    Assessment & Plan:   Principal Problem:   Pyelonephritis Active Problems:   Neuropathy   Chronic pain syndrome   Status post insertion of spinal cord stimulator   Hypertension   Degenerative disc disease, lumbar   Urinary retention   Obstructive uropathy   Anxiety   Dementia (HCC)   1-Right side hydronephrosis, nephrolithiasis, pyelonephritis: Patient presented with right-sided flank pain, urine culture grew Enterobacter. Patient was started on meropenem. Urology is planning definitive treatment for nephrolithiasis on Friday, if  stable Currently on Bactrim  SBO: General surgery consulted and following Had a bowel movement 3/18, passing gas, tolerating full liquid diet NG tube was removed 3/17 Passing gas, NPO for HIDA scan.   Transaminases: Worse over the last couple of days.  Patient was also having some abdominal pain and right upper quadrant tenderness Ultrasound showed mild gallbladder wall thickening cannot rule out cholecystitis Developed worsening pain in the afternoon 03/19, plan to proceed with HIDA scan today   Anemia of chronic disease, Continue to monitor hemoglobin  Hypokalemia:  Replete IV   Chronic back pain: Continue with oxycodone, he is on chronic narcotics  Chronic urinary retention: Continue with Foley catheter.  Urology following  Dementia: Brother is primary caretaker Resume Effexor.  Neuropathy; check B 12  Essential hypertension Holding Vasotec due to AKI on admission.   Resume diltiazem.   AKI resolved   Moderate hemorrhage in the right renal cyst -watch per urology   Estimated body mass index is 27.37 kg/m as calculated from the following:   Height as of this encounter: 5\' 8"  (1.727 m).   Weight as of this encounter: 81.6 kg.   DVT prophylaxis: SCDs Code Status: Full code Family Communication: Brother at bedside Disposition Plan:  Status is: Inpatient Remains inpatient appropriate because: management of UTI, SBO    Consultants:  Surgery Urology  Procedures:  Cystoscopy, stent placement.   Antimicrobials:    Subjective:  He is alert, report no further abdominal pain since yesterday. Brother was on phone during my evaluation.  Patient is still passing gas/ last BM 3/18   Objective: Vitals:   04/28/23 0556 04/28/23 1344 04/28/23 2039 04/29/23 0535  BP: 125/78 (!) 143/72 (!) 144/86 (!) 143/76  Pulse: 78 82 81 77  Resp: 15 16 16 18   Temp: 98.7 F (37.1 C) 97.8 F (36.6 C) 99.3 F (37.4 C) 98.7 F (37.1 C)  TempSrc: Oral Oral Oral Oral  SpO2: 100% 100% 100% 100%  Weight:      Height:        Intake/Output Summary (Last 24 hours) at 04/29/2023 1036 Last data filed at 04/29/2023 0942 Gross per 24 hour  Intake 3510.28 ml  Output 2100 ml  Net 1410.28 ml   Filed Weights   04/19/23 1457  Weight: 81.6 kg    Examination:  General exam: NAD Respiratory system: CTA Cardiovascular system: S 1, S 2 RRR Gastrointestinal system: BS present, soft nt Central nervous system: Alert Extremities: trace edema    Data Reviewed: I have personally reviewed following labs and imaging studies  CBC: Recent Labs  Lab  04/25/23 0454 04/26/23 0411 04/27/23 0437 04/28/23 0503 04/29/23 0824  WBC 17.0* 13.2* 11.0* 13.1* 13.3*  HGB 8.4* 8.5* 7.8* 7.7* 8.0*  HCT 27.8* 26.8* 24.3* 25.2* 26.4*  MCV 100.0 99.3 97.2 100.4* 101.1*  PLT 453* 462* 467* 430* 429*   Basic Metabolic Panel: Recent Labs  Lab 04/24/23 0540 04/25/23 0454 04/26/23 0411 04/27/23 0437 04/28/23 0503 04/29/23 0824  NA 140 142 145 143 143 136  K 3.8 3.6 3.5 3.3* 3.1* 3.5  CL 104 108 113* 111 113* 111  CO2 25 25 23 23  21* 18*  GLUCOSE 120* 109* 111* 116* 109* 83  BUN 34* 35* 32* 31* 25* 17  CREATININE 1.23 1.04 0.98 1.00 0.99 0.68  CALCIUM 9.1 8.7* 8.5* 8.5* 8.1* 7.8*  MG 2.2 2.1 2.1 2.0 1.9  --   PHOS 3.3  --  2.8 3.1 3.0  --    GFR: Estimated Creatinine Clearance: 81.9 mL/min (by C-G formula based on SCr of 0.68 mg/dL). Liver Function Tests: Recent Labs  Lab 04/26/23 0411 04/27/23 0437 04/28/23 0503 04/29/23 0824  AST 111* 206* 110* 57*  ALT 217* 224* 177* 141*  ALKPHOS 100 92 85 81  BILITOT 1.5* 1.5* 1.1 1.1  PROT 6.5 6.1* 5.8* 5.9*  ALBUMIN 2.0* 2.1* 1.9* 2.1*   No results for input(s): "LIPASE", "AMYLASE" in the last 168 hours. No results for input(s): "AMMONIA" in the last 168 hours. Coagulation Profile: No results for input(s): "INR", "PROTIME" in the last 168 hours. Cardiac Enzymes: No results for input(s): "CKTOTAL", "CKMB", "CKMBINDEX", "TROPONINI" in the last 168 hours. BNP (last 3 results) No results for input(s): "PROBNP" in the last 8760 hours. HbA1C: No results for input(s): "HGBA1C" in the last 72 hours. CBG: Recent Labs  Lab 04/26/23 0738  GLUCAP 112*   Lipid Profile: No results for input(s): "CHOL", "HDL", "LDLCALC", "TRIG", "CHOLHDL", "LDLDIRECT" in the last 72 hours. Thyroid Function Tests: No results for input(s): "TSH", "T4TOTAL", "FREET4", "T3FREE", "THYROIDAB" in the last 72 hours. Anemia Panel: No results for input(s): "VITAMINB12", "FOLATE", "FERRITIN", "TIBC", "IRON",  "RETICCTPCT" in the last 72 hours. Sepsis Labs: No results for input(s): "PROCALCITON", "LATICACIDVEN" in the last 168 hours.  Recent Results (from the past 240 hours)  Urine Culture     Status: Abnormal   Collection Time: 04/19/23  4:23 PM   Specimen: Urine, Random  Result Value Ref Range Status   Specimen Description   Final    URINE, RANDOM Performed at [facility], [address] [place], [place] [address]    Special Requests   Final    NONE Reflexed from [id number] Performed at [facility], [address] [place], [place] [address]    Culture 20,000 COLONIES/mL ENTEROBACTER CLOACAE (A)  Final   Report Status 04/21/2023 FINAL  Final   Organism ID, Bacteria ENTEROBACTER CLOACAE (A)  Final      Susceptibility   Enterobacter cloacae - MIC*    CEFEPIME 1 SENSITIVE Sensitive     CIPROFLOXACIN 1 RESISTANT Resistant     GENTAMICIN <=1 SENSITIVE Sensitive     IMIPENEM 1 SENSITIVE Sensitive     NITROFURANTOIN 128 RESISTANT Resistant     TRIMETH/SULFA <=20 SENSITIVE Sensitive     PIP/TAZO 64 INTERMEDIATE  Intermediate ug/mL    * 20,000 COLONIES/mL ENTEROBACTER CLOACAE  Culture, blood (Routine X 2) w Reflex to ID Panel     Status: None   Collection Time: 04/20/23  8:23 AM   Specimen: BLOOD RIGHT HAND  Result Value Ref Range Status   Specimen Description   Final    BLOOD RIGHT HAND Performed at [facility], [address], [place], [place] [address]    Special Requests   Final    BOTTLES DRAWN AEROBIC ONLY Blood Culture results may not be optimal due to an inadequate volume of blood received in culture bottles Performed at [facility], [address] [place], [place] [address]    Culture   Final    NO GROWTH 5 DAYS Performed at [facility], [address] [place], [place] [address]    Report Status 04/25/2023 FINAL  Final  Culture, blood (Routine X 2) w Reflex to ID Panel     Status: None   Collection Time: 04/20/23  8:23 AM   Specimen: BLOOD  RIGHT ARM  Result Value Ref Range Status   Specimen Description   Final    BLOOD RIGHT ARM Performed at [facility], [address], [place], [place] [address]    Special Requests   Final    BOTTLES DRAWN AEROBIC ONLY Blood Culture results may not be optimal due to an inadequate volume of blood received in culture bottles Performed at [facility], [address], [place], [place] [address]    Culture   Final    NO GROWTH 5 DAYS Performed at [facility], [address], [place], [place] [address]    Report Status 04/25/2023 FINAL  Final         Radiology Studies: No results found.       Scheduled Meds:  Chlorhexidine Gluconate Cloth  6 each Topical Daily   cyanocobalamin  1,000 mcg Oral Daily   diltiazem  120 mg Oral Daily   polyethylene glycol  17 g Oral BID   senna-docusate  1 tablet Oral BID   sulfamethoxazole-trimethoprim  1 tablet Oral Q12H   tamsulosin  0.4 mg Oral QPM   venlafaxine XR  75 mg Oral Q breakfast   Continuous Infusions:  sodium chloride 100 mL/hr at 04/29/23 1005   potassium chloride       LOS: 10 days    Time spent: 35 minutes    Rai Sinagra A Demarco Bacci, MD [facility]   If 7PM-7AM, please contact night-coverage [email or web address]  04/29/2023, 10:36 AM

## 2023-04-29 NOTE — TOC Progression Note (Signed)
 Transition of Care [facility]) - Progression Note    Patient Details  Name: Luke Foster MRN: [id number] Date of Birth: 1950-04-05  Transition of Care [facility]) CM/SW Contact  Howell Rucks, RN Phone Number: 04/29/2023, 11:03 AM  Clinical Narrative:  Met with pt at bedside, pt's brother, Elijah Birk, on cell phone, per Elijah Birk pt undergoing test with scheduled urological procedure tomorrow,  states he remains unsure of dc disposition at this time.   TOC will continue to follow for dc planning.      Expected Discharge Plan: Home w Home Health Services Barriers to Discharge: Continued Medical Work up  Expected Discharge Plan and Services       Living arrangements for the past 2 months: Single Family Home                                       Social Determinants of Health (SDOH) Interventions SDOH Screenings   Food Insecurity: No Food Insecurity (04/20/2023)  Housing: Low Risk  (04/20/2023)  Transportation Needs: No Transportation Needs (04/20/2023)  Utilities: Not At Risk (04/20/2023)  Social Connections: Moderately Isolated (04/20/2023)  Tobacco Use: Medium Risk (04/22/2023)    Readmission Risk Interventions    04/22/2023   10:55 AM 06/19/2022    1:54 PM 01/16/2022    1:14 PM  Readmission Risk Prevention Plan  Transportation Screening Complete Complete Complete  PCP or Specialist Appt within 3-5 Days Complete    HRI or Home Care Consult Complete Complete Complete  Social Work Consult for Recovery Care Planning/Counseling Complete Complete Complete  Palliative Care Screening Not Applicable Not Applicable Not Applicable  Medication Review Oceanographer) Complete Complete Complete

## 2023-04-30 ENCOUNTER — Inpatient Hospital Stay (HOSPITAL_COMMUNITY)

## 2023-04-30 ENCOUNTER — Encounter (HOSPITAL_COMMUNITY): Payer: Self-pay | Admitting: Family Medicine

## 2023-04-30 ENCOUNTER — Ambulatory Visit (HOSPITAL_COMMUNITY): Admission: RE | Admit: 2023-04-30 | Source: Home / Self Care | Admitting: Urology

## 2023-04-30 ENCOUNTER — Encounter (HOSPITAL_COMMUNITY)
Admission: EM | Disposition: A | Discharge status: Home Health | Payer: Self-pay | Source: Home / Self Care | Attending: Internal Medicine

## 2023-04-30 DIAGNOSIS — I10 Essential (primary) hypertension: Secondary | ICD-10-CM

## 2023-04-30 DIAGNOSIS — F418 Other specified anxiety disorders: Secondary | ICD-10-CM | POA: Diagnosis not present

## 2023-04-30 DIAGNOSIS — N12 Tubulo-interstitial nephritis, not specified as acute or chronic: Secondary | ICD-10-CM | POA: Diagnosis not present

## 2023-04-30 DIAGNOSIS — N202 Calculus of kidney with calculus of ureter: Secondary | ICD-10-CM | POA: Diagnosis not present

## 2023-04-30 HISTORY — PX: CYSTOSCOPY/URETEROSCOPY/HOLMIUM LASER/STENT PLACEMENT: SHX6546

## 2023-04-30 LAB — IRON AND TIBC
Iron: 17 ug/dL — ABNORMAL LOW (ref 45–182)
Saturation Ratios: 10 % — ABNORMAL LOW (ref 17.9–39.5)
TIBC: 178 ug/dL — ABNORMAL LOW (ref 250–450)
UIBC: 161 ug/dL

## 2023-04-30 LAB — COMPREHENSIVE METABOLIC PANEL
ALT: 102 U/L — ABNORMAL HIGH (ref 0–44)
AST: 39 U/L (ref 15–41)
Albumin: 2 g/dL — ABNORMAL LOW (ref 3.5–5.0)
Alkaline Phosphatase: 76 U/L (ref 38–126)
Anion gap: 8 (ref 5–15)
BUN: 14 mg/dL (ref 8–23)
CO2: 20 mmol/L — ABNORMAL LOW (ref 22–32)
Calcium: 8 mg/dL — ABNORMAL LOW (ref 8.9–10.3)
Chloride: 109 mmol/L (ref 98–111)
Creatinine, Ser: 0.83 mg/dL (ref 0.61–1.24)
GFR, Estimated: >60 mL/min (ref >60)
Glucose, Bld: 83 mg/dL (ref 70–99)
Potassium: 3.5 mmol/L (ref 3.5–5.1)
Sodium: 137 mmol/L (ref 135–145)
Total Bilirubin: 1.2 mg/dL (ref 0.0–1.2)
Total Protein: 5.5 g/dL — ABNORMAL LOW (ref 6.5–8.1)

## 2023-04-30 LAB — CBC
HCT: 24.2 % — ABNORMAL LOW (ref 39.0–52.0)
Hemoglobin: 7.4 g/dL — ABNORMAL LOW (ref 13.0–17.0)
MCH: 30.5 pg (ref 26.0–34.0)
MCHC: 30.6 g/dL (ref 30.0–36.0)
MCV: 99.6 fL (ref 80.0–100.0)
Platelets: 391 10*3/uL (ref 150–400)
RBC: 2.43 MIL/uL — ABNORMAL LOW (ref 4.22–5.81)
RDW: 14.3 % (ref 11.5–15.5)
WBC: 13.5 10*3/uL — ABNORMAL HIGH (ref 4.0–10.5)
nRBC: 0 % (ref 0.0–0.2)

## 2023-04-30 LAB — VITAMIN B12: Vitamin B-12: 1403 pg/mL — ABNORMAL HIGH (ref 180–914)

## 2023-04-30 LAB — FERRITIN: Ferritin: 541 ng/mL — ABNORMAL HIGH (ref 24–336)

## 2023-04-30 LAB — FOLATE: Folate: 4.7 ng/mL — ABNORMAL LOW (ref >5.9)

## 2023-04-30 SURGERY — CYSTOSCOPY/URETEROSCOPY/HOLMIUM LASER/STENT PLACEMENT
Anesthesia: General | Site: Bladder | Laterality: Right

## 2023-04-30 MED ORDER — ONDANSETRON HCL 4 MG/2ML IJ SOLN
INTRAMUSCULAR | Status: DC | PRN
Start: 2023-04-30 — End: 2023-04-30
  Administered 2023-04-30: 4 mg via INTRAVENOUS

## 2023-04-30 MED ORDER — PHENYLEPHRINE 80 MCG/ML (10ML) SYRINGE FOR IV PUSH (FOR BLOOD PRESSURE SUPPORT)
PREFILLED_SYRINGE | INTRAVENOUS | Status: AC
  Filled 2023-04-30: qty 10

## 2023-04-30 MED ORDER — FENTANYL CITRATE (PF) 100 MCG/2ML IJ SOLN
INTRAMUSCULAR | Status: AC
  Filled 2023-04-30: qty 2

## 2023-04-30 MED ORDER — FOLIC ACID 1 MG PO TABS
1.0000 mg | ORAL_TABLET | Freq: Every day | ORAL | Status: DC
  Administered 2023-04-30 – 2023-05-01 (×2): 1 mg via ORAL
  Filled 2023-04-30 (×2): qty 1

## 2023-04-30 MED ORDER — DROPERIDOL 2.5 MG/ML IJ SOLN: 0.6250 mg | Freq: Once | INTRAMUSCULAR | Status: DC | PRN

## 2023-04-30 MED ORDER — ACETAMINOPHEN 10 MG/ML IV SOLN: 1000.0000 mg | Freq: Once | INTRAVENOUS | Status: DC | PRN

## 2023-04-30 MED ORDER — ONDANSETRON HCL 4 MG/2ML IJ SOLN
INTRAMUSCULAR | Status: AC
  Filled 2023-04-30: qty 2

## 2023-04-30 MED ORDER — FENTANYL CITRATE PF 50 MCG/ML IJ SOSY
25.0000 ug | PREFILLED_SYRINGE | INTRAMUSCULAR | Status: DC | PRN
  Administered 2023-04-30 (×2): 50 ug via INTRAVENOUS

## 2023-04-30 MED ORDER — EPHEDRINE SULFATE-NACL 50-0.9 MG/10ML-% IV SOSY
PREFILLED_SYRINGE | INTRAVENOUS | Status: DC | PRN
  Administered 2023-04-30: 10 mg via INTRAVENOUS

## 2023-04-30 MED ORDER — PROPOFOL 10 MG/ML IV BOLUS
INTRAVENOUS | Status: AC
  Filled 2023-04-30: qty 20

## 2023-04-30 MED ORDER — LACTATED RINGERS IV SOLN: INTRAVENOUS | Status: DC | PRN

## 2023-04-30 MED ORDER — DEXAMETHASONE SODIUM PHOSPHATE 4 MG/ML IJ SOLN
INTRAMUSCULAR | Status: DC | PRN
  Administered 2023-04-30: 8 mg via INTRAVENOUS

## 2023-04-30 MED ORDER — FERROUS SULFATE 325 (65 FE) MG PO TABS
325.0000 mg | ORAL_TABLET | Freq: Every day | ORAL | Status: DC
  Administered 2023-04-30: 325 mg via ORAL
  Filled 2023-04-30: qty 1

## 2023-04-30 MED ORDER — EPHEDRINE 5 MG/ML INJ
INTRAVENOUS | Status: AC
  Filled 2023-04-30: qty 5

## 2023-04-30 MED ORDER — SODIUM CHLORIDE 0.9 % IR SOLN
Status: DC | PRN
  Administered 2023-04-30: 3000 mL

## 2023-04-30 MED ORDER — LIDOCAINE HCL (PF) 2 % IJ SOLN
INTRAMUSCULAR | Status: DC | PRN
  Administered 2023-04-30: 80 mg via INTRADERMAL

## 2023-04-30 MED ORDER — VANCOMYCIN HCL IN DEXTROSE 1-5 GM/200ML-% IV SOLN
1000.0000 mg | INTRAVENOUS | Status: AC
  Administered 2023-04-30: 1000 mg via INTRAVENOUS
  Filled 2023-04-30: qty 200

## 2023-04-30 MED ORDER — LIDOCAINE HCL (PF) 2 % IJ SOLN
INTRAMUSCULAR | Status: AC
Start: 2023-04-30 — End: ?
  Filled 2023-04-30: qty 5

## 2023-04-30 MED ORDER — SODIUM CHLORIDE 0.9 % IV SOLN
2.0000 g | INTRAVENOUS | Status: AC
  Administered 2023-04-30: 2 g via INTRAVENOUS
  Filled 2023-04-30: qty 20

## 2023-04-30 MED ORDER — FENTANYL CITRATE (PF) 100 MCG/2ML IJ SOLN
INTRAMUSCULAR | Status: DC | PRN
  Administered 2023-04-30 (×6): 50 ug via INTRAVENOUS

## 2023-04-30 MED ORDER — PHENYLEPHRINE 80 MCG/ML (10ML) SYRINGE FOR IV PUSH (FOR BLOOD PRESSURE SUPPORT)
PREFILLED_SYRINGE | INTRAVENOUS | Status: DC | PRN
Start: 2023-04-30 — End: 2023-04-30
  Administered 2023-04-30: 160 ug via INTRAVENOUS
  Administered 2023-04-30: 80 ug via INTRAVENOUS
  Administered 2023-04-30 (×3): 160 ug via INTRAVENOUS

## 2023-04-30 MED ORDER — POTASSIUM CHLORIDE CRYS ER 20 MEQ PO TBCR: 40.0000 meq | EXTENDED_RELEASE_TABLET | Freq: Once | ORAL | Status: DC

## 2023-04-30 MED ORDER — OXYCODONE HCL 5 MG/5ML PO SOLN: 5.0000 mg | Freq: Once | ORAL | Status: DC | PRN

## 2023-04-30 MED ORDER — FENTANYL CITRATE PF 50 MCG/ML IJ SOSY
PREFILLED_SYRINGE | INTRAMUSCULAR | Status: AC
  Filled 2023-04-30: qty 1

## 2023-04-30 MED ORDER — PROPOFOL 10 MG/ML IV BOLUS
INTRAVENOUS | Status: DC | PRN
  Administered 2023-04-30: 150 mg via INTRAVENOUS

## 2023-04-30 MED ORDER — FENTANYL CITRATE PF 50 MCG/ML IJ SOSY
PREFILLED_SYRINGE | INTRAMUSCULAR | Status: AC
Start: 2023-04-30 — End: 2023-05-01
  Filled 2023-04-30: qty 1

## 2023-04-30 MED ORDER — DEXAMETHASONE SODIUM PHOSPHATE 10 MG/ML IJ SOLN
INTRAMUSCULAR | Status: AC
  Filled 2023-04-30: qty 1

## 2023-04-30 MED ORDER — OXYCODONE HCL 5 MG PO TABS: 5.0000 mg | ORAL_TABLET | Freq: Once | ORAL | Status: DC | PRN

## 2023-04-30 SURGICAL SUPPLY — 20 items
BAG URO CATCHER STRL LF (MISCELLANEOUS) ×1 IMPLANT
BASKET LASER NITINOL 1.9FR (BASKET) IMPLANT
BASKET ZERO TIP NITINOL 2.4FR (BASKET) IMPLANT
CATH URETERAL DUAL LUMEN 10F (MISCELLANEOUS) IMPLANT
CATH URETL OPEN END 6FR 70 (CATHETERS) ×1 IMPLANT
CLOTH BEACON ORANGE TIMEOUT ST (SAFETY) ×1 IMPLANT
EXTRACTOR STONE 1.7FRX115CM (UROLOGICAL SUPPLIES) IMPLANT
GLOVE BIO SURGEON STRL SZ7.5 (GLOVE) ×1 IMPLANT
GOWN STRL REUS W/ TWL XL LVL3 (GOWN DISPOSABLE) ×1 IMPLANT
GUIDEWIRE ANG ZIPWIRE 038X150 (WIRE) IMPLANT
GUIDEWIRE STR DUAL SENSOR (WIRE) ×1 IMPLANT
KIT TURNOVER KIT A (KITS) IMPLANT
LASER FIB FLEXIVA PULSE ID 365 (Laser) IMPLANT
MANIFOLD NEPTUNE II (INSTRUMENTS) ×1 IMPLANT
PACK CYSTO (CUSTOM PROCEDURE TRAY) ×1 IMPLANT
SHEATH NAVIGATOR HD 11/13X28 (SHEATH) IMPLANT
SHEATH NAVIGATOR HD 11/13X36 (SHEATH) IMPLANT
TRACTIP FLEXIVA PULS ID 200XHI (Laser) IMPLANT
TUBING CONNECTING 10 (TUBING) ×1 IMPLANT
TUBING UROLOGY SET (TUBING) ×1 IMPLANT

## 2023-04-30 NOTE — Op Note (Signed)
 Operative Note  Preoperative diagnosis:  1.  Right renal and ureteral calculi  Postoperative diagnosis: 1.  Right renal and ureteral calculi  Procedure(s): 1.  Cystoscopy with right retrograde pyelogram, right ureteroscopy with laser lithotripsy and ureteral stone basketing, ureteral stent exchange  Surgeon: Modena Slater, MD  Assistants: None  Anesthesia: General  Complications: None immediate  EBL: Minimal  Specimens: 1.  Renal calculi  Drains/Catheters: 1.  6 x 26 double-J ureteral stent 2.  18 French Foley catheter  Intraoperative findings: 1.  Normal anterior urethra 2.  Large obstructing prostate with trilobar hypertrophy.  Large median lobe. 3.  Bladder mucosa without tumors or masses.  Moderate trabeculation. 4.  Right ureteroscopy revealed multiple stones within the right ureter which were laser fragmented to smaller fragments and basket extracted.  Several renal calculi were basket extracted.  5.  Right retrograde pyelogram at the conclusion of the case showed no hydronephrosis and no filling defect  Indication: 73 year old male status post urgent right ureteral stent placement presents for definitive management of his stones.  Description of procedure:  The patient was identified and consent was obtained.  The patient was taken to the operating room and placed in the supine position.  The patient was placed under general anesthesia.  Perioperative antibiotics were administered.  The patient was placed in dorsal lithotomy.  Patient was prepped and draped in a standard sterile fashion and a timeout was performed.  A 21 French rigid cystoscope was advanced into the urethra and into the bladder.  Complete cystoscopy was performed with the findings noted above.  The stent was grasped pulled just beyond the urethral meatus.  A wire was advanced through the stent and into the kidney under fluoroscopic guidance and the stent was withdrawn.  Semirigid ureteroscopy was  performed alongside the wire and several stones were basket extracted.  There was a larger stone that was laser fragmented to smaller pieces followed by basket distraction.  I cleared the ureter of stone.  I passed a second wire through the scope and into the kidney.  I advanced an 11 x 13 ureteral access sheath over one of the wires under continuous fluoroscopic guidance up to the proximal ureter.  Inner sheath and wire were withdrawn.  Digital ureteroscopy was performed and several stones were basket extracted.  All clinically significant stone fragments were removed from the right kidney.  I shot a retrograde pyelogram through the scope with the findings noted above.  I withdrew the scope along with the access sheath visualizing the ureter upon removal.  There were no clinically significant stone fragments and no ureteral injury was identified.  I backloaded the wire onto a rigid cystoscope and advanced that into the bladder followed by routine placement of a 6 x 26 double-J ureteral stent.  Fluoroscopy confirmed proximal placement and direct visualization confirmed a good coil within the bladder.  I drained the bladder of any small calculi in the bladder.  I withdrew the scope and placed an 71 French Foley catheter.  This concluded the operation.  Patient tolerated the procedure well was stable postoperatively.  Plan: Follow-up in 1 week for stent removal.  Okay to discharge from urological standpoint.

## 2023-04-30 NOTE — Anesthesia Preprocedure Evaluation (Signed)
 Anesthesia Evaluation  Patient identified by MRN, date of birth, ID band Patient awake    Reviewed: Allergy & Precautions, NPO status , Patient's Chart, lab work & pertinent test results  Airway Mallampati: I  TM Distance: >3 FB Neck ROM: Full    Dental  (+) Teeth Intact, Dental Advisory Given   Pulmonary sleep apnea , former smoker   breath sounds clear to auscultation       Cardiovascular hypertension, Pt. on medications  Rhythm:Regular Rate:Normal     Neuro/Psych  PSYCHIATRIC DISORDERS Anxiety Depression   Dementia  Neuromuscular disease    GI/Hepatic negative GI ROS, Neg liver ROS,,,  Endo/Other  negative endocrine ROS    Renal/GU Renal diseaseRIGHT RENAL AND URETERAL STONE     Musculoskeletal  (+) Arthritis ,    Abdominal   Peds  Hematology negative hematology ROS (+)   Anesthesia Other Findings   Reproductive/Obstetrics                              Anesthesia Physical Anesthesia Plan  ASA: 3  Anesthesia Plan: General   Post-op Pain Management: Tylenol PO (pre-op)*   Induction: Intravenous  PONV Risk Score and Plan: 3 and Ondansetron, Dexamethasone and Treatment may vary due to age or medical condition  Airway Management Planned: LMA  Additional Equipment: None  Intra-op Plan:   Post-operative Plan: Extubation in OR  Informed Consent: I have reviewed the patients History and Physical, chart, labs and discussed the procedure including the risks, benefits and alternatives for the proposed anesthesia with the patient or authorized representative who has indicated his/her understanding and acceptance.   Patient has DNR.  Discussed DNR with power of attorney and Discussed DNR with patient.   Dental advisory given  Plan Discussed with: CRNA and Anesthesiologist  Anesthesia Plan Comments:         Anesthesia Quick Evaluation

## 2023-04-30 NOTE — Progress Notes (Signed)
 PROGRESS NOTE    MERCURY ROCK  ZOX:[id number] DOB: 12-25-1950 DOA: 04/19/2023 PCP: Elfredia Nevins, MD   Brief Narrative: 73 year old with past medical history significant for dementia, lives at home, neck pain spinal cord stimulator, hyperlipidemia, neurogenic bladder with indwelling Foley catheter, hypertension presents complaining of right-sided flank pain found to have nephrolithiasis.  Urology consulted, underwent cystoscopy, pyelogram, right stent insertion by Dr. Heide Scales on 3 /11, hospital course complicated by SVA 3/13, general surgery was consulted.  Ultrasound right upper quadrant was ordered for abdominal pain finding with mild gallbladder wall thickening unable to rule out cholecystitis.  Developed abdominal pain this afternoon, general surgery is ordering HIDA scan.    Assessment & Plan:   Principal Problem:   Pyelonephritis Active Problems:   Neuropathy   Chronic pain syndrome   Status post insertion of spinal cord stimulator   Hypertension   Degenerative disc disease, lumbar   Urinary retention   Obstructive uropathy   Anxiety   Dementia (HCC)   1-Right side hydronephrosis, nephrolithiasis, pyelonephritis: Patient presented with right-sided flank pain, urine culture grew Enterobacter. Patient was started on meropenem. Urology is planning definitive treatment for nephrolithiasis on Friday, if  stable Currently on Bactrim  SBO: General surgery consulted and following Had a bowel movement 3/18, passing gas, tolerating full liquid diet NG tube was removed 3/17 HIDA scan negative for cholecystitis.  Plan to resume soft diet after urologic procedure today.   Transaminases: Worse over the last couple of days.  Patient was also having some abdominal pain and right upper quadrant tenderness Ultrasound showed mild gallbladder wall thickening cannot rule out cholecystitis Developed worsening pain in the afternoon 03/19, HIDA ordered and negative for cholecystitis.    Anemia of chronic disease, Continue to monitor hemoglobin Iron and folic acid deficiency.  Iron deficiency anemia.  Start oral supplement.  Will give IV iron tomorrow.   Hypokalemia: Replaced  Chronic back pain: Continue with oxycodone, he is on chronic narcotics  Chronic urinary retention: Continue with Foley catheter.  Urology following  Dementia: Brother is primary caretaker Resume Effexor.  Neuropathy; check B 12  Essential hypertension Holding Vasotec due to AKI on admission.   Resume diltiazem.   AKI resolved   Moderate hemorrhage in the right renal cyst -watch per urology   Estimated body mass index is 27.35 kg/m as calculated from the following:   Height as of this encounter: 5\' 8"  (1.727 m).   Weight as of this encounter: 81.6 kg.   DVT prophylaxis: SCDs Code Status: Full code Family Communication: Brother at bedside Disposition Plan:  Status is: Inpatient Remains inpatient appropriate because: management of UTI, SBO    Consultants:  Surgery Urology  Procedures:  Cystoscopy, stent placement.   Antimicrobials:    Subjective: He is passing gas. Denies abdominal pain.  No new complaints.  Brother would like PT/OT see patient tomorrow prior to discharge  Objective: Vitals:   04/29/23 1433 04/29/23 2114 04/30/23 0611 04/30/23 1224  BP: (!) 141/71 (!) 142/82 132/74 (!) 148/75  Pulse: 84 89 80 98  Resp: 16 20 20 20   Temp:  99.5 F (37.5 C) 98 F (36.7 C) 98.4 F (36.9 C)  TempSrc:    Oral  SpO2: 97% 97% 99% 97%  Weight:    81.6 kg  Height:    5\' 8"  (1.727 m)    Intake/Output Summary (Last 24 hours) at 04/30/2023 1347 Last data filed at 04/30/2023 1100 Gross per 24 hour  Intake 1101.32 ml  Output 3200  ml  Net -2098.68 ml   Filed Weights   04/19/23 1457 04/30/23 1224  Weight: 81.6 kg 81.6 kg    Examination:  General exam: NAD Respiratory system: CTA Cardiovascular system: S 1, S 2 RRR Gastrointestinal system: BS present, soft  nt Central nervous system: Alert Extremities: trace edema    Data Reviewed: I have personally reviewed following labs and imaging studies  CBC: Recent Labs  Lab 04/26/23 0411 04/27/23 0437 04/28/23 0503 04/29/23 0824 04/30/23 0426  WBC 13.2* 11.0* 13.1* 13.3* 13.5*  HGB 8.5* 7.8* 7.7* 8.0* 7.4*  HCT 26.8* 24.3* 25.2* 26.4* 24.2*  MCV 99.3 97.2 100.4* 101.1* 99.6  PLT 462* 467* 430* 429* 391   Basic Metabolic Panel: Recent Labs  Lab 04/24/23 0540 04/25/23 0454 04/26/23 0411 04/27/23 0437 04/28/23 0503 04/29/23 0824 04/30/23 0426  NA 140 142 145 143 143 136 137  K 3.8 3.6 3.5 3.3* 3.1* 3.5 3.5  CL 104 108 113* 111 113* 111 109  CO2 25 25 23 23  21* 18* 20*  GLUCOSE 120* 109* 111* 116* 109* 83 83  BUN 34* 35* 32* 31* 25* 17 14  CREATININE 1.23 1.04 0.98 1.00 0.99 0.68 0.83  CALCIUM 9.1 8.7* 8.5* 8.5* 8.1* 7.8* 8.0*  MG 2.2 2.1 2.1 2.0 1.9  --   --   PHOS 3.3  --  2.8 3.1 3.0  --   --    GFR: Estimated Creatinine Clearance: 79 mL/min (by C-G formula based on SCr of 0.83 mg/dL). Liver Function Tests: Recent Labs  Lab 04/26/23 0411 04/27/23 0437 04/28/23 0503 04/29/23 0824 04/30/23 0426  AST 111* 206* 110* 57* 39  ALT 217* 224* 177* 141* 102*  ALKPHOS 100 92 85 81 76  BILITOT 1.5* 1.5* 1.1 1.1 1.2  PROT 6.5 6.1* 5.8* 5.9* 5.5*  ALBUMIN 2.0* 2.1* 1.9* 2.1* 2.0*   No results for input(s): "LIPASE", "AMYLASE" in the last 168 hours. No results for input(s): "AMMONIA" in the last 168 hours. Coagulation Profile: No results for input(s): "INR", "PROTIME" in the last 168 hours. Cardiac Enzymes: No results for input(s): "CKTOTAL", "CKMB", "CKMBINDEX", "TROPONINI" in the last 168 hours. BNP (last 3 results) No results for input(s): "PROBNP" in the last 8760 hours. HbA1C: No results for input(s): "HGBA1C" in the last 72 hours. CBG: Recent Labs  Lab 04/26/23 0738  GLUCAP 112*   Lipid Profile: No results for input(s): "CHOL", "HDL", "LDLCALC", "TRIG",  "CHOLHDL", "LDLDIRECT" in the last 72 hours. Thyroid Function Tests: No results for input(s): "TSH", "T4TOTAL", "FREET4", "T3FREE", "THYROIDAB" in the last 72 hours. Anemia Panel: Recent Labs    04/30/23 0426 04/30/23 0904  VITAMINB12 1,403*  --   FOLATE  --  4.7*  FERRITIN  --  541*  TIBC  --  178*  IRON  --  17*   Sepsis Labs: No results for input(s): "PROCALCITON", "LATICACIDVEN" in the last 168 hours.  No results found for this or any previous visit (from the past 240 hours).        Radiology Studies: NM Hepatobiliary Liver Func Result Date: 04/29/2023 CLINICAL DATA:  Cholelithiasis EXAM: NUCLEAR MEDICINE HEPATOBILIARY IMAGING TECHNIQUE: Sequential images of the abdomen were obtained out to 60 minutes following intravenous administration of radiopharmaceutical. RADIOPHARMACEUTICALS:  5.2 fall mCi Tc-82m  Choletec IV COMPARISON:  Ultrasound 04/27/2023.  CT 04/22/2023 FINDINGS: Prompt uptake and biliary excretion of activity by the liver is seen. Gallbladder activity is visualized, consistent with patency of cystic duct. Biliary activity passes into small bowel, consistent with  patent common bile duct. IMPRESSION: No common duct or cystic duct obstruction Electronically Signed   By: Karen Kays M.D.   On: 04/29/2023 15:39         Scheduled Meds:  [MAR Hold] Chlorhexidine Gluconate Cloth  6 each Topical Daily   [MAR Hold] diltiazem  120 mg Oral Daily   [MAR Hold] ferrous sulfate  325 mg Oral Q2000   [MAR Hold] folic acid  1 mg Oral Daily   [MAR Hold] polyethylene glycol  17 g Oral BID   [MAR Hold] senna-docusate  1 tablet Oral BID   [MAR Hold] sulfamethoxazole-trimethoprim  1 tablet Oral Q12H   [MAR Hold] tamsulosin  0.4 mg Oral QPM   [MAR Hold] venlafaxine XR  75 mg Oral Q breakfast   Continuous Infusions:  sodium chloride 50 mL/hr at 04/29/23 1037   cefTRIAXone (ROCEPHIN)  IV     [MAR Hold] vancomycin       LOS: 11 days    Time spent: 35 minutes    Evo Aderman  A Kie Calvin, MD [facility]   If 7PM-7AM, please contact night-coverage [email or web address]  04/30/2023, 1:47 PM

## 2023-04-30 NOTE — Transfer of Care (Signed)
 Immediate Anesthesia Transfer of Care Note  Patient: Luke Foster  Procedure(s) Performed: CYSTOSCOPY/URETEROSCOPY/HOLMIUM LASER/STENT PLACEMENT (Right: Bladder)  Patient Location: PACU  Anesthesia Type:General  Level of Consciousness: awake and patient cooperative  Airway & Oxygen Therapy: Patient Spontanous Breathing and Patient connected to face mask oxygen  Post-op Assessment: Report given to RN and Post -op Vital signs reviewed and stable  Post vital signs: Reviewed and stable  Last Vitals:  Vitals Value Taken Time  BP 162/95 04/30/23 1511  Temp    Pulse 91 04/30/23 1514  Resp 24 04/30/23 1514  SpO2 100 % 04/30/23 1514  Vitals shown include unfiled device data.  Last Pain:  Vitals:   04/30/23 1224  TempSrc: Oral  PainSc: 0-No pain      Patients Stated Pain Goal: 0 (04/30/23 0734)  Complications: No notable events documented.

## 2023-04-30 NOTE — TOC Progression Note (Signed)
 Transition of Care [facility]) - Progression Note    Patient Details  Name: Luke Foster MRN: [id number] Date of Birth: 1951/01/06  [facility]) CM/SW Contact  Howell Rucks, RN Phone Number: 04/30/2023, 3:12 PM  Clinical Narrative:  PT rec for SNF, pt/family declined, agreeable to [facility] PT/OT, order placed. Call to pt's brother, Elijah Birk, reports on service with Frances Furbish for [facility] RN. Frances Furbish, rep-Cory, accepted for [facility] PT/OT, added to AVS.     Expected Discharge Plan: Home w Home Health Services Barriers to Discharge: Continued Medical Work up  Expected Discharge Plan and Services       Living arrangements for the past 2 months: Single Family Home                                       Social Determinants of Health (SDOH) Interventions SDOH Screenings   Food Insecurity: No Food Insecurity (04/20/2023)  Housing: Low Risk  (04/20/2023)  Transportation Needs: No Transportation Needs (04/20/2023)  Utilities: Not At Risk (04/20/2023)  Social Connections: Moderately Isolated (04/20/2023)  Tobacco Use: Medium Risk (04/30/2023)    Readmission Risk Interventions    04/22/2023   10:55 AM 06/19/2022    1:54 PM 01/16/2022    1:14 PM  Readmission Risk Prevention Plan  Transportation Screening Complete Complete Complete  PCP or Specialist Appt within 3-5 Days Complete    HRI or Home Care Consult Complete Complete Complete  Social Work Consult for Recovery Care Planning/Counseling Complete Complete Complete  Palliative Care Screening Not Applicable Not Applicable Not Applicable  Medication Review Oceanographer) Complete Complete Complete

## 2023-04-30 NOTE — Plan of Care (Signed)
  Problem: Clinical Measurements: Goal: Diagnostic test results will improve Outcome: Progressing   Problem: Nutrition: Goal: Adequate nutrition will be maintained Outcome: Progressing   Problem: Coping: Goal: Level of anxiety will decrease Outcome: Progressing

## 2023-04-30 NOTE — Interval H&P Note (Signed)
 History and Physical Interval Note:  04/30/2023 1:47 PM  Luke Foster  has presented today for surgery, with the diagnosis of RIGHT RENAL AND URETERAL STONE.  The various methods of treatment have been discussed with the patient and family. After consideration of risks, benefits and other options for treatment, the patient has consented to  Procedure(s): CYSTOSCOPY/URETEROSCOPY/HOLMIUM LASER/STENT PLACEMENT (Right) as a surgical intervention.  The patient's history has been reviewed, patient examined, no change in status, stable for surgery.  I have reviewed the patient's chart and labs.  Questions were answered to the patient's satisfaction.     Ray Church, III

## 2023-04-30 NOTE — Anesthesia Procedure Notes (Signed)
 Procedure Name: LMA Insertion Date/Time: 04/30/2023 2:00 PM  Performed by: Cleda Clarks, CRNAPre-anesthesia Checklist: Patient identified, Emergency Drugs available, Suction available and Patient being monitored Patient Re-evaluated:Patient Re-evaluated prior to induction Oxygen Delivery Method: Circle system utilized Preoxygenation: Pre-oxygenation with 100% oxygen Induction Type: IV induction Ventilation: Mask ventilation without difficulty LMA: LMA inserted LMA Size: 4.0 Number of attempts: 1 Placement Confirmation: positive ETCO2 Tube secured with: Tape Dental Injury: Teeth and Oropharynx as per pre-operative assessment

## 2023-04-30 NOTE — Anesthesia Postprocedure Evaluation (Signed)
 Anesthesia Post Note  Patient: Luke Foster  Procedure(s) Performed: CYSTOSCOPY/URETEROSCOPY/HOLMIUM LASER/STENT PLACEMENT (Right: Bladder)     Patient location during evaluation: PACU Anesthesia Type: General Level of consciousness: awake and alert Pain management: pain level controlled Vital Signs Assessment: post-procedure vital signs reviewed and stable Respiratory status: spontaneous breathing, nonlabored ventilation, respiratory function stable and patient connected to nasal cannula oxygen Cardiovascular status: blood pressure returned to baseline and stable Postop Assessment: no apparent nausea or vomiting Anesthetic complications: no   No notable events documented.  Last Vitals:  Vitals:   04/30/23 1600 04/30/23 1624  BP: (!) 147/74 (!) 149/72  Pulse: 83 81  Resp: 16   Temp: 36.8 C   SpO2: 100% 100%    Last Pain:  Vitals:   04/30/23 1600  TempSrc:   PainSc: 5                  [place]

## 2023-04-30 NOTE — Progress Notes (Addendum)
 Initial Nutrition Assessment  INTERVENTION:   Once diet advanced: -Boost Plus TID- Each supplement provides 360kcal and 14g protein.    -Multivitamin with minerals daily  NUTRITION DIAGNOSIS:   Increased nutrient needs related to acute illness as evidenced by estimated needs.  GOAL:   Patient will meet greater than or equal to 90% of their needs  MONITOR:   PO intake, Supplement acceptance  REASON FOR ASSESSMENT:   NPO/Clear Liquid Diet (x 10 days)    ASSESSMENT:   73 year old with past medical history significant for dementia, lives at home, neck pain spinal cord stimulator, hyperlipidemia, neurogenic bladder with indwelling Foley catheter, hypertension presents complaining of right-sided flank pain found to have nephrolithiasis.  3/10: admitted, regular diet 3/13: CLD 3/14: NPO, NGT placed 3/17: CLD, NGT removed 3/18: FLD 3/19: Soft diet->NPO->CLD 3/20: NPO -> FLD, s/p HIDA  Patient in room, brother at bedside. Pt reports good appetite PTA, for a few days he wasn't eating as he normally would when he had abdominal pain but typically is a good eater, self described "meat and potatoes guy". States he does take vitamin supplements, Vitamins D, B12 and C. Has been found to have low folate so MD is starting him on folic acid.  Pt currently NPO for planned cystoscopy, stent placement today.  Pt states he has drank Boost in the past and enjoys them. Recommend Boost once diet is advanced given that pt has had inconsistent intakes since admission (10 days).   Patient weighed in bed: 230 lbs (blankets on bed). Pt reports UBW ~212-215 lbs.  Medications: Ferrous sulfate, Folic acid, Miralax, Senokot  Labs reviewed: Low iron Low folate   NUTRITION - FOCUSED PHYSICAL EXAM:  No depletions noted.  Diet Order:   Diet Order             Diet NPO time specified  Diet effective midnight           Diet NPO time specified  Diet effective midnight                    EDUCATION NEEDS:   No education needs have been identified at this time  Skin:  Skin Assessment: Reviewed RN Assessment  Last BM:  3/20 -type 7  Height:   Ht Readings from Last 1 Encounters:  04/30/23 5\' 8"  (1.727 m)    Weight:   Wt Readings from Last 1 Encounters:  04/30/23 81.6 kg    BMI:  Body mass index is 27.35 kg/m.  Estimated Nutritional Needs:   Kcal:  2100-2300  Protein:  85-100g  Fluid:  2.1L/day   Tilda Franco, MS, RD, LDN Inpatient Clinical Dietitian Contact via Secure chat

## 2023-05-01 ENCOUNTER — Encounter (HOSPITAL_COMMUNITY): Payer: Self-pay | Admitting: Urology

## 2023-05-01 DIAGNOSIS — N12 Tubulo-interstitial nephritis, not specified as acute or chronic: Secondary | ICD-10-CM | POA: Diagnosis not present

## 2023-05-01 LAB — BASIC METABOLIC PANEL
Anion gap: 6 (ref 5–15)
BUN: 17 mg/dL (ref 8–23)
CO2: 25 mmol/L (ref 22–32)
Calcium: 8 mg/dL — ABNORMAL LOW (ref 8.9–10.3)
Chloride: 104 mmol/L (ref 98–111)
Creatinine, Ser: 0.98 mg/dL (ref 0.61–1.24)
GFR, Estimated: >60 mL/min (ref >60)
Glucose, Bld: 121 mg/dL — ABNORMAL HIGH (ref 70–99)
Potassium: 4.1 mmol/L (ref 3.5–5.1)
Sodium: 135 mmol/L (ref 135–145)

## 2023-05-01 LAB — CBC
HCT: 24.9 % — ABNORMAL LOW (ref 39.0–52.0)
Hemoglobin: 7.8 g/dL — ABNORMAL LOW (ref 13.0–17.0)
MCH: 30.1 pg (ref 26.0–34.0)
MCHC: 31.3 g/dL (ref 30.0–36.0)
MCV: 96.1 fL (ref 80.0–100.0)
Platelets: 404 10*3/uL — ABNORMAL HIGH (ref 150–400)
RBC: 2.59 MIL/uL — ABNORMAL LOW (ref 4.22–5.81)
RDW: 14.3 % (ref 11.5–15.5)
WBC: 14.1 10*3/uL — ABNORMAL HIGH (ref 4.0–10.5)
nRBC: 0 % (ref 0.0–0.2)

## 2023-05-01 MED ORDER — FERROUS SULFATE 325 (65 FE) MG PO TABS: 325.0000 mg | ORAL_TABLET | Freq: Every day | ORAL | 3 refills | Status: AC

## 2023-05-01 MED ORDER — SENNOSIDES-DOCUSATE SODIUM 8.6-50 MG PO TABS: 1.0000 | ORAL_TABLET | Freq: Two times a day (BID) | ORAL | 0 refills | Status: DC

## 2023-05-01 MED ORDER — FOLIC ACID 1 MG PO TABS: 1.0000 mg | ORAL_TABLET | Freq: Every day | ORAL | 0 refills | Status: AC

## 2023-05-01 MED ORDER — SODIUM CHLORIDE 0.9 % IV SOLN
100.0000 mg | Freq: Once | INTRAVENOUS | Status: AC
  Administered 2023-05-01: 100 mg via INTRAVENOUS
  Filled 2023-05-01: qty 5

## 2023-05-01 MED ORDER — SULFAMETHOXAZOLE-TRIMETHOPRIM 800-160 MG PO TABS: 1.0000 | ORAL_TABLET | Freq: Two times a day (BID) | ORAL | 0 refills | Status: AC

## 2023-05-01 NOTE — Progress Notes (Signed)
 Occupational Therapy Treatment Patient Details Name: Luke Foster MRN: [id number] DOB: [birth year] Today's Date: 05/01/2023   History of present illness 73 year old male who presented from home with c/o worsening back pain; had fall ~ 1 wk prior to adm. CT head negative.  CT abdomen/pelvi showed moderate right hydronephrosis, hydroureter, 9 mm obstructing stone in the distal right ureter, additional 6 mm stone, possible pyelonephritis. Urology consulted, s/p right ureteral stent on 3/11.  PMH: chronic back pain, chronic Foley catheter for urinary retention, nephrolithiasis, hypertension, depression, dementia .          If plan is discharge home, recommend the following:  A lot of help with bathing/dressing/bathroom;A lot of help with walking and/or transfers;Assistance with cooking/housework;Assist for transportation;Direct supervision/assist for medications management;Supervision due to cognitive status;Help with stairs or ramp for entrance         Precautions / Restrictions Precautions Precautions: Fall       Mobility Bed Mobility               General bed mobility comments: pt in chair    Transfers Overall transfer level: Needs assistance Equipment used: Rolling walker (2 wheels) Transfers: Sit to/from Stand Sit to Stand: From elevated surface, Mod assist, +2 physical assistance, +2 safety/equipment                 Balance Overall balance assessment: Needs assistance, History of Falls Sitting-balance support: Feet supported, Single extremity supported Sitting balance-Leahy Scale: Fair     Standing balance support: Bilateral upper extremity supported, Reliant on assistive device for balance, During functional activity Standing balance-Leahy Scale: Poor Standing balance comment: High fall risk due to weaknes and prolonged hospital stay                           ADL either performed or assessed with clinical judgement   ADL Overall ADL's : Needs  assistance/impaired         Upper Body Bathing: Minimal assistance;Sitting   Lower Body Bathing: Maximal assistance;Sitting/lateral leans;Cueing for sequencing;Cueing for safety;+2 for physical assistance;Sit to/from stand   Upper Body Dressing : Minimal assistance;Sitting   Lower Body Dressing: +2 for physical assistance;Cueing for sequencing;Cueing for safety;Sitting/lateral leans;Sit to/from stand;Maximal assistance                 General ADL Comments: pt states he has 24/7 A at home with all  ADL activity    Extremity/Trunk Assessment Upper Extremity Assessment Upper Extremity Assessment: Generalized weakness                     Communication Communication Communication: No apparent difficulties   Cognition Arousal: Alert Behavior During Therapy: WFL for tasks assessed/performed                                 Following commands: Intact        Cueing   Cueing Techniques: Verbal cues, Visual cues  Exercises              Pertinent Vitals/ Pain       Pain Assessment Pain Assessment: No/denies pain            Progress Toward Goals  OT Goals(current goals can now be found in the care plan section)  Progress towards OT goals: Progressing toward goals     Plan         AM-PAC  OT "6 Clicks" Daily Activity     Outcome Measure   Help from another person eating meals?: None Help from another person taking care of personal grooming?: A Little Help from another person toileting, which includes using toliet, bedpan, or urinal?: A Lot Help from another person bathing (including washing, rinsing, drying)?: A Lot Help from another person to put on and taking off regular upper body clothing?: A Little Help from another person to put on and taking off regular lower body clothing?: Total 6 Click Score: 15    End of Session Equipment Utilized During Treatment: Rolling walker (2 wheels)  OT Visit Diagnosis: Unsteadiness on feet  (R26.81);History of falling (Z91.81);Muscle weakness (generalized) (M62.81)   Activity Tolerance     Patient Left with call bell/phone within reach;in chair   Nurse Communication Mobility status        Time: 1145-1200 OT Time Calculation (min): 15 min  Charges: OT General Charges $OT Visit: 1 Visit OT Treatments $Self Care/Home Management : 8-22 mins    Taysha Majewski, [place] 05/01/2023, 1:44 PM

## 2023-05-01 NOTE — Progress Notes (Signed)
 AVS reviewed w/ pt and brother Orvilla Fus - both verbalized an understanding - Foley strap placed prior to d/c - pt has had a chronic foley in place- PIV removed as noted- pt dressed for d/c to home- pt to lobby via w/c - home w/ brother

## 2023-05-01 NOTE — Plan of Care (Signed)
   Problem: Nutrition: Goal: Adequate nutrition will be maintained Outcome: Adequate for Discharge

## 2023-05-01 NOTE — Discharge Summary (Signed)
 Physician Discharge Summary   Patient: Luke Foster MRN: [id number] DOB: 1950/02/15  Admit date:     04/19/2023  Discharge date: 05/01/23  Discharge Physician: Alba Cory   PCP: Elfredia Nevins, MD   Recommendations at discharge:   Needs to follow up with Urology in 1 week post procedure and stent removal.  Needs CBC to follow Hb.   Discharge Diagnoses: Principal Problem:   Pyelonephritis Active Problems:   Neuropathy   Chronic pain syndrome   Status post insertion of spinal cord stimulator   Hypertension   Degenerative disc disease, lumbar   Urinary retention   Obstructive uropathy   Anxiety   Dementia (HCC)  Resolved Problems:   * No resolved hospital problems. *  Hospital Course: 73 year old with past medical history significant for dementia, lives at home, neck pain spinal cord stimulator, hyperlipidemia, neurogenic bladder with indwelling Foley catheter, hypertension presents complaining of right-sided flank pain found to have nephrolithiasis.  Urology consulted, underwent cystoscopy, pyelogram, right stent insertion by Dr. Heide Scales on 3 /11, hospital course complicated by SVA 3/13, general surgery was consulted.  Ultrasound right upper quadrant was ordered for abdominal pain finding with mild gallbladder wall thickening unable to rule out cholecystitis.  Developed abdominal pain this afternoon, general surgery is ordering HIDA scan.      Assessment and Plan: 1-Right side hydronephrosis, nephrolithiasis, pyelonephritis: Patient presented with right-sided flank pain, urine culture grew Enterobacter. Patient was started on meropenem. Urology is planning definitive treatment for nephrolithiasis on Friday, if  stable Currently on Bactrim, discharge ion 5 days post procedure.    SBO: General surgery consulted and following Had a bowel movement 3/18, passing gas, tolerating full liquid diet NG tube was removed 3/17 HIDA scan negative for cholecystitis.  Tolerating  diet, had BM   Transaminases: Worse over the last couple of days.  Patient was also having some abdominal pain and right upper quadrant tenderness Ultrasound showed mild gallbladder wall thickening cannot rule out cholecystitis Developed worsening pain in the afternoon 03/19, HIDA ordered and negative for cholecystitis.    Anemia of chronic disease, Continue to monitor hemoglobin Iron and folic acid deficiency.  Iron deficiency anemia.  Started oral supplement.  IV iron prior to discharge    Hypokalemia: Replaced   Chronic back pain: Continue with oxycodone, he is on chronic narcotics   Chronic urinary retention: Continue with Foley catheter.  Urology following   Dementia: Brother is primary caretaker Resume Effexor.  Neuropathy;  B 12 elevated   Essential hypertension Holding Vasotec due to AKI on admission.   Resume diltiazem.    AKI resolved   Moderate hemorrhage in the right renal cyst -watch per urology   Estimated body mass index is 27.35 kg/m as calculated from the following:   Height as of this encounter: 5\' 8"  (1.727 m).   Weight as of this encounter: 81.6 kg.          Consultants: Urology, Surgery  Procedures performed: cystoscopy, Lithotripsy Disposition: Home Diet recommendation:  Discharge Diet Orders (From admission, onward)     Start     Ordered   05/01/23 0000  Diet - low sodium heart healthy        05/01/23 0847           Cardiac diet DISCHARGE MEDICATION: Allergies as of 05/01/2023       Reactions   Erythromycin Itching        Medication List     STOP taking these medications  enalapril 5 MG tablet Commonly known as: VASOTEC   mirabegron ER 50 MG Tb24 tablet Commonly known as: MYRBETRIQ   sulfamethoxazole-trimethoprim 400-80 MG tablet Commonly known as: BACTRIM Replaced by: sulfamethoxazole-trimethoprim 800-160 MG tablet       TAKE these medications    acetaminophen 500 MG tablet Commonly known as: TYLENOL Take  1,000 mg by mouth every 6 (six) hours as needed for mild pain or moderate pain.   ascorbic acid 500 MG tablet Commonly known as: VITAMIN C Take 500 mg by mouth daily.   cyanocobalamin 1000 MCG tablet Commonly known as: VITAMIN B12 Take 1,000 mcg by mouth in the morning and at bedtime.   Dilt-XR 120 MG 24 hr capsule Generic drug: diltiazem Take 120 mg by mouth daily.   docusate sodium 100 MG capsule Commonly known as: COLACE Take 1 capsule (100 mg total) by mouth in the morning, at noon, and at bedtime. What changed:  how much to take when to take this   ferrous sulfate 325 (65 FE) MG tablet Take 1 tablet (325 mg total) by mouth daily at 8 pm.   folic acid 1 MG tablet Commonly known as: FOLVITE Take 1 tablet (1 mg total) by mouth daily. Start taking on: May 02, 2023   GEMTESA PO Take 50 mg by mouth at bedtime.   lidocaine 5 % Commonly known as: LIDODERM Place 1 patch onto the skin daily as needed (pain).   oxyCODONE 15 MG immediate release tablet Commonly known as: ROXICODONE Take 1 tablet (15 mg total) by mouth every 6 (six) hours as needed for pain. What changed: when to take this   polyethylene glycol 17 g packet Commonly known as: MIRALAX / GLYCOLAX Take 17 g by mouth 2 (two) times daily.   rOPINIRole 0.5 MG tablet Commonly known as: REQUIP Take 0.5 mg by mouth at bedtime as needed (restless legs).   senna-docusate 8.6-50 MG tablet Commonly known as: Senokot-S Take 1 tablet by mouth 2 (two) times daily.   solifenacin 5 MG tablet Commonly known as: VESICARE Take 5 mg by mouth daily.   sulfamethoxazole-trimethoprim 800-160 MG tablet Commonly known as: BACTRIM DS Take 1 tablet by mouth every 12 (twelve) hours for 5 days. Replaces: sulfamethoxazole-trimethoprim 400-80 MG tablet   tamsulosin 0.4 MG Caps capsule Commonly known as: FLOMAX Take 1 capsule (0.4 mg total) by mouth every evening. What changed: when to take this   venlafaxine XR 75 MG 24 hr  capsule Commonly known as: EFFEXOR-XR Take 1 capsule (75 mg total) by mouth in the morning and at bedtime.   Vitamin D 50 MCG (2000 UT) Caps Take 2,000 Units by mouth daily.        Follow-up Information     [facility] Follow up.   Specialty: Home Health Services Why: Home Health Physical Therapy/Home Health Occupational Therapy Contact information: [address] [place] [address] [phone]         Elfredia Nevins, MD Follow up in 1 week(s).   Specialty: Internal Medicine Contact information: [address] [place] [address] [phone]                Discharge Exam: Ceasar Mons Weights   04/19/23 1457 04/30/23 1224  Weight: 81.6 kg 81.6 kg   General; NAD  Condition at discharge: stable  The results of significant diagnostics from this hospitalization (including imaging, microbiology, ancillary and laboratory) are listed below for reference.   Imaging Studies: DG C-Arm 1-60 Min-No Report Result  Date: 04/30/2023 Fluoroscopy was utilized by the requesting physician.  No radiographic interpretation.   NM Hepatobiliary Liver Func Result Date: 04/29/2023 CLINICAL DATA:  Cholelithiasis EXAM: NUCLEAR MEDICINE HEPATOBILIARY IMAGING TECHNIQUE: Sequential images of the abdomen were obtained out to 60 minutes following intravenous administration of radiopharmaceutical. RADIOPHARMACEUTICALS:  5.2 fall mCi Tc-19m  Choletec IV COMPARISON:  Ultrasound 04/27/2023.  CT 04/22/2023 FINDINGS: Prompt uptake and biliary excretion of activity by the liver is seen. Gallbladder activity is visualized, consistent with patency of cystic duct. Biliary activity passes into small bowel, consistent with patent common bile duct. IMPRESSION: No common duct or cystic duct obstruction Electronically Signed   By: Karen Kays M.D.   On: 04/29/2023 15:39   US Abdomen Limited RUQ (LIVER/GB) Result Date: 04/27/2023 CLINICAL DATA:  Elevated liver function tests  EXAM: ULTRASOUND ABDOMEN LIMITED RIGHT UPPER QUADRANT COMPARISON:  Noncontrast CT 04/22/2023. FINDINGS: Gallbladder: Distended gallbladder. Sludge and stones are identified. Also some slight wall thickening of 4 mm. No reported sonographic Murphy's sign. Common bile duct: Diameter: 4 mm Liver: No focal lesion identified. Within normal limits in parenchymal echogenicity. Portal vein is patent on color Doppler imaging with normal direction of blood flow towards the liver. Other: Small right pleural effusion. IMPRESSION: Distended gallbladder with sludge and stones. Slight wall thickening as well but no ductal dilatation or adjacent fluid. Please correlate with clinical presentation of acute cholecystitis and if needed further workup as clinically appropriate such as HIDA scan. Small right pleural effusion. Electronically Signed   By: Karen Kays M.D.   On: 04/27/2023 12:05   DG Abd Portable 1V-Small Bowel Obstruction Protocol-initial, 8 hr delay Result Date: 04/26/2023 CLINICAL DATA:  Small-bowel obstruction.  8 hour delayed image. EXAM: PORTABLE ABDOMEN - 1 VIEW COMPARISON:  Radiographs 04/24/2023 and 04/23/2023.  CT 04/22/2023. FINDINGS: 0518 hours. Two views are submitted. There is no prior imaging immediately following enteric contrast administration for comparison. Tip of the enteric tube overlies the right upper quadrant of the abdomen, likely in the distal stomach. Double-J right ureteral stent and bladder catheter remain in place. A small amount of enteric contrast is present within the decompressed stomach. There is dilute contrast within multiple mildly dilated loops of small bowel. Some contrast has extended into the ascending and descending colon. No definite extravasated enteric contrast identified. No acute osseous findings are seen status post lower lumbar fusion. IMPRESSION: Dilute contrast within multiple mildly dilated loops of small bowel with some contrast extending into the ascending and  descending colon. Findings may reflect a partial small bowel obstruction or ileus. No high-grade obstruction or definite extravasated enteric contrast identified. Electronically Signed   By: Carey Bullocks M.D.   On: 04/26/2023 09:22   DG Abd 1 View Result Date: 04/24/2023 CLINICAL DATA:  [id number] Small bowel obstruction (HCC) [id number] EXAM: ABDOMEN - 1 VIEW COMPARISON:  04/23/2023 FINDINGS: Enteric tube is position within the stomach. Tube appears kinked upon itself. Dilute contrast within dilated loops of small bowel. No definite contrast within the colon. No gross free intraperitoneal air on supine imaging. Right sided nephroureteral stent. IMPRESSION: 1. Dilute contrast within dilated loops of small bowel. No definite contrast within the colon. 2. Enteric tube is positioned within the stomach. Tube appears kinked upon itself. Electronically Signed   By: Duanne Guess D.O.   On: 04/24/2023 14:02   DG Abd Portable 1V-Small Bowel Obstruction Protocol-initial, 8 hr delay Result Date: 04/23/2023 CLINICAL DATA:  8 hour small-bowel delay EXAM: PORTABLE ABDOMEN - 1 VIEW COMPARISON:  04/23/2023, CT 04/22/2023 FINDINGS: Enteric tube tip overlies the duodenal bulb region. Enteral contrast collection at the gastric fundus. Persistent small bowel distension. Dilute contrast within dilated small bowel in the left lower quadrant. No colon contrast. Right-sided ureteral stent. IMPRESSION: Persistent small bowel distension with contrast in the gastric fundus and dilute contrast within dilated small bowel in the left lower quadrant. No colon contrast. Electronically Signed   By: Jasmine Pang M.D.   On: 04/23/2023 23:57   DG Abd 1 View Result Date: 04/23/2023 CLINICAL DATA:  Small bowel obstruction EXAM: ABDOMEN - 1 VIEW COMPARISON:  Earlier today FINDINGS: Tip and side port of the enteric tube below the diaphragm in the stomach. Gaseous central small bowel dilatation has air-fluid levels on this upright view. Right  ureteral stent remains in place. IMPRESSION: 1. Gaseous central small bowel dilatation with air-fluid levels on this upright view. This may represent ileus or obstruction 2. Tip and side port of the enteric tube below the diaphragm in the stomach. Electronically Signed   By: Narda Rutherford M.D.   On: 04/23/2023 14:51   DG Abd Portable 1V-Small Bowel Protocol-Position Verification Result Date: 04/23/2023 CLINICAL DATA:  Nasogastric tube placement. EXAM: PORTABLE ABDOMEN - 1 VIEW COMPARISON:  Radiograph earlier today. FINDINGS: Tip and side port of the enteric tube below the diaphragm in the stomach. Moderate diffuse gaseous small bowel distension centrally. Moderate volume of stool in the colon. Right ureteral stent in place. Catheter projects over the pelvis. IMPRESSION: 1. Tip and side port of the enteric tube below the diaphragm in the stomach. 2. Moderate diffuse gaseous small bowel distension centrally, may represent ileus or obstruction. This is similar to exam earlier today. Electronically Signed   By: Narda Rutherford M.D.   On: 04/23/2023 14:50   DG Abd Portable 1V Result Date: 04/23/2023 CLINICAL DATA:  Ileus. EXAM: PORTABLE ABDOMEN - 1 VIEW COMPARISON:  January 17, 2022. FINDINGS: Right-sided ureteral stent is noted in grossly good position. Mildly dilated small bowel loops are noted. No definite colonic dilatation is noted. No definite contrast is noted in the colon. IMPRESSION: Mildly dilated small bowel loops are noted concerning for ileus or possibly distal small bowel obstruction. Electronically Signed   By: Lupita Raider M.D.   On: 04/23/2023 09:31   CT ABDOMEN PELVIS WO CONTRAST Result Date: 04/22/2023 CLINICAL DATA:  Abdominal pain. EXAM: CT ABDOMEN AND PELVIS WITHOUT CONTRAST TECHNIQUE: Multidetector CT imaging of the abdomen and pelvis was performed following the standard protocol without IV contrast. RADIATION DOSE REDUCTION: This exam was performed according to the departmental  dose-optimization program which includes automated exposure control, adjustment of the mA and/or kV according to patient size and/or use of iterative reconstruction technique. COMPARISON:  CT abdomen pelvis dated 04/19/2023. FINDINGS: Evaluation of this exam is limited due to respiratory motion as well as in the absence of intravenous contrast. Lower chest: Partially visualized small right pleural effusion and partial compressive atelectasis of the right lower lobe. Pneumonia is not excluded. Minimal left lung base linear atelectasis. There is coronary vascular calcification. No intra-abdominal free air or free fluid. Hepatobiliary: The liver is unremarkable. No biliary dilatation. Layering sludge noted in the gallbladder. No pericholecystic fluid. No calcified gallstone. Pancreas: Unremarkable. No pancreatic ductal dilatation or surrounding inflammatory changes. Spleen: Normal in size without focal abnormality. Adrenals/Urinary Tract: The right adrenal glands unremarkable. Indeterminate 1 cm left adrenal nodule. Small left renal interpolar cyst. There is a 5 mm nonobstructing left renal inferior pole calculus. There  has been interval placement of a right pigtail ureteral stent with proximal tip in the right renal collecting system and distal end in the urinary bladder. Near complete resolution of the previously seen right hydronephrosis. Multiple nonobstructing right renal inferior pole calculi with combined length of approximately 1 cm. There has been interval development of moderate amount of high attenuating content within the large right renal cyst consistent with hemorrhage. Small pockets of air also noted within this collection there is right perinephric stranding and possible small hematoma. Relatively similar location of a 9 mm distal right ureteral calculus or the pelvic brim. The additional nonobstructing smaller stone in the distal right ureter is no longer visualized and likely past into the bladder.  The urinary bladder is minimally distended. Similar appearance of a cluster of small stones along the posterior bladder wall. A Foley catheter noted in the bladder. Tiny pocket of air within the urinary bladder, likely introduced via catheter. Stomach/Bowel: Postsurgical changes of bowel with anastomotic staple line in the lower abdomen. Mildly dilated small bowel loops measure up to 3.3 cm in caliber. The terminal ileum and distal small bowel in the right lower quadrant are collapsed. Although a transition appears to be present in the right lower quadrant, findings may represent an ileus or developing obstruction. Follow-up with small-bowel series recommended to document passage of contrast into the colon. Vascular/Lymphatic: Moderate aortoiliac atherosclerotic disease. The IVC is unremarkable. No portal venous gas. There is no adenopathy. Reproductive: Enlarged prostate gland with median lobe hypertrophy indenting with bladder. Other: Subcutaneous edema of the right lateral abdominal wall. Musculoskeletal: Osteopenia with degenerative changes of the spine. Lower lumbar posterior fusion. No acute osseous pathology. IMPRESSION: 1. Interval placement of a right pigtail ureteral stent with near complete resolution of the previously seen right hydronephrosis. 2. Interval development of moderate amount of hemorrhage within the large right renal cyst. 3. Relatively similar location of a 9 mm distal right ureteral calculus or the pelvic brim. 4. Ileus versus developing small-bowel obstruction. Follow-up with small-bowel series recommended. Electronically Signed   By: Elgie Collard M.D.   On: 04/22/2023 14:14   DG C-Arm 1-60 Min-No Report Result Date: 04/20/2023 Fluoroscopy was utilized by the requesting physician.  No radiographic interpretation.   CT ABDOMEN PELVIS W CONTRAST Result Date: 04/19/2023 CLINICAL DATA:  Fall with low back pain green urine in bag EXAM: CT ABDOMEN AND PELVIS WITH CONTRAST TECHNIQUE:  Multidetector CT imaging of the abdomen and pelvis was performed using the standard protocol following bolus administration of intravenous contrast. RADIATION DOSE REDUCTION: This exam was performed according to the departmental dose-optimization program which includes automated exposure control, adjustment of the mA and/or kV according to patient size and/or use of iterative reconstruction technique. CONTRAST:  80mL OMNIPAQUE IOHEXOL 300 MG/ML  SOLN COMPARISON:  CT 11/03/2022 FINDINGS: Lower chest: Lung bases demonstrate atelectasis and or scarring at the lung bases. No acute airspace disease Hepatobiliary: Distended gallbladder. No calcified stone or biliary dilatation Pancreas: Atrophic.  No inflammation Spleen: Normal in size without focal abnormality. Adrenals/Urinary Tract: Adrenal glands are normal. Bilateral renal cysts, on the right measures up to 14 cm, no specific imaging follow-up is recommended. Bilateral kidney stones, on the left measuring up to 3 mm and on the right measuring up to 4 mm. New moderate right hydronephrosis and hydroureter, secondary to a 9 mm stone in the distal right ureter at the level of L5-S1. There is an additional stone within the ureter distal to the obstructing stone, this measures  6 mm and is visual about 2 cm proximal to the left UVJ. There is perinephric fluid and stranding on the right in addition to mild urothelial enhancement. Delayed excretion of contrast from right kidney consistent with obstruction. Bladder is decompressed by Foley catheter. Diffuse bladder wall thickening with perivesical stranding. Small calcifications within the bladder probably due to stones Stomach/Bowel: Stomach nonenlarged. No dilated small bowel. No acute bowel wall thickening. Vascular/Lymphatic: Aortic atherosclerosis. No enlarged abdominal or pelvic lymph nodes. Reproductive: Enlarged prostate Other: Negative for pelvic effusion or free air Musculoskeletal: See separately dictated lumbar  spine CT report. No acute osseous findings could in the pelvis. IMPRESSION: 1. Moderate right hydronephrosis and hydroureter, secondary to a 9 mm stone in the distal right ureter at the level of L5-S1. There is an additional 6 mm stone within the ureter distal to the obstructing stone. There is perinephric fluid and stranding on the right in addition to mild urothelial enhancement raising possibility of superimposed infection. 2. Bilateral kidney stones. Bladder is decompressed by Foley catheter. Diffuse bladder wall thickening with perivesical stranding, question cystitis. Small calcifications within the bladder probably due to stones. 3. Enlarged prostate. 4. Aortic atherosclerosis. Electronically Signed   By: Jasmine Pang M.D.   On: 04/19/2023 20:17   CT L-SPINE NO CHARGE Result Date: 04/19/2023 CLINICAL DATA:  Back pain, fall 2 days ago. EXAM: CT LUMBAR SPINE WITHOUT CONTRAST TECHNIQUE: Multidetector CT imaging of the lumbar spine was performed without intravenous contrast administration. Multiplanar CT image reconstructions were also generated. RADIATION DOSE REDUCTION: This exam was performed according to the departmental dose-optimization program which includes automated exposure control, adjustment of the mA and/or kV according to patient size and/or use of iterative reconstruction technique. COMPARISON:  MRI lumbar spine 03/22/2019, CT lumbar spine 10/24/2018 FINDINGS: Segmentation: 5 lumbar type vertebrae. Alignment: Lumbar lordosis is maintained.  No listhesis. Vertebrae: Diffuse osteopenia. Similar irregularity of the T12 superior endplate with moderate height loss centrally. Additional irregularities of the L1 superior and inferior endplates with mild height loss anteriorly is similar to prior. No additional compression deformities or evidence of displaced fracture on the current study. Posterior instrumented fusion from L4-S1 with bilateral pedicle screws and vertical interlocking rods. Hardware is  intact. Mature osseous fusion noted from L4-S1. Paraspinal and other soft tissues: The paraspinal musculature is unremarkable. Large partially visualized cystic lesion involving the right kidney better evaluated on same day CT abdomen pelvis. Additional partially visualized left renal cyst. Multiple calculi noted in the right kidney with prominence of the right renal collecting system. Atherosclerosis of the abdominal aorta and branch vessels. Degenerative changes of the bilateral sacroiliac joints. Disc levels: Intervertebral disc spaces are maintained. There is a disc bulge at L1-2 along the posterior osteophytes more pronounced along the left without significant osseous spinal canal stenosis. Additional disc bulge and facet arthrosis at L2-3 resulting in mild spinal canal stenosis. Disc bulge and facet arthrosis at L3-4 resulting in mild spinal canal stenosis. Disc bulge and facet osteophytes contributing to mild right and mild-to-moderate left foraminal stenosis at L3-4. No definite spinal canal narrowing at L4-5 or L5-S1. IMPRESSION: 1. No acute fracture or traumatic malalignment of the lumbar spine. 2. Similar chronic compression deformities of T12 and L1. No retropulsion. 3. Posterior instrumented fusion from L4-S1 without evidence of hardware complication. 4. Multilevel degenerative changes of the lumbar spine as described. Electronically Signed   By: Emily Filbert M.D.   On: 04/19/2023 20:11   CT Head Wo Contrast Result Date: 04/19/2023 CLINICAL  DATA:  Altered mental status EXAM: CT HEAD WITHOUT CONTRAST TECHNIQUE: Contiguous axial images were obtained from the base of the skull through the vertex without intravenous contrast. RADIATION DOSE REDUCTION: This exam was performed according to the departmental dose-optimization program which includes automated exposure control, adjustment of the mA and/or kV according to patient size and/or use of iterative reconstruction technique. COMPARISON:  04/29/2020  FINDINGS: Brain: There is no mass, hemorrhage or extra-axial collection. There is generalized atrophy without lobar predilection. Hypodensity of the white matter is most commonly associated with chronic microvascular disease. Vascular: No hyperdense vessel or unexpected vascular calcification. Skull: The visualized skull base, calvarium and extracranial soft tissues are normal. Sinuses/Orbits: Partial sphenoid sinus opacification. Normal orbits. Other: None. IMPRESSION: 1. No acute intracranial abnormality. 2. Generalized atrophy and findings of chronic microvascular disease. Electronically Signed   By: Deatra Robinson M.D.   On: 04/19/2023 20:03   DG Chest 2 View Result Date: 04/19/2023 CLINICAL DATA:  Sepsis.  Back pain. EXAM: CHEST - 2 VIEW COMPARISON:  11/03/2022 FINDINGS: Two views of the chest demonstrate low lung volumes. Linear densities in left lower lung are suggestive for atelectasis. Linear densities in the right lung may also represent atelectasis. Heart size is normal. No large pleural effusions. Negative for a pneumothorax. IMPRESSION: Low lung volumes with bilateral atelectasis. Electronically Signed   By: Richarda Overlie M.D.   On: 04/19/2023 18:05    Microbiology: Results for orders placed or performed during the hospital encounter of 04/19/23  Urine Culture     Status: Abnormal   Collection Time: 04/19/23  4:23 PM   Specimen: Urine, Random  Result Value Ref Range Status   Specimen Description   Final    URINE, RANDOM Performed at [facility], [address], [place], [place] [address]    Special Requests   Final    NONE Reflexed from [phone] Performed at [facility], [address], [place], [place] [address]    Culture 20,000 COLONIES/mL ENTEROBACTER CLOACAE (A)  Final   Report Status 04/21/2023 FINAL  Final   Organism ID, Bacteria ENTEROBACTER CLOACAE (A)  Final      Susceptibility   Enterobacter cloacae - MIC*    CEFEPIME 1 SENSITIVE Sensitive     CIPROFLOXACIN 1 RESISTANT  Resistant     GENTAMICIN <=1 SENSITIVE Sensitive     IMIPENEM 1 SENSITIVE Sensitive     NITROFURANTOIN 128 RESISTANT Resistant     TRIMETH/SULFA <=20 SENSITIVE Sensitive     PIP/TAZO 64 INTERMEDIATE Intermediate ug/mL    * 20,000 COLONIES/mL ENTEROBACTER CLOACAE  Culture, blood (Routine X 2) w Reflex to ID Panel     Status: None   Collection Time: 04/20/23  8:23 AM   Specimen: BLOOD RIGHT HAND  Result Value Ref Range Status   Specimen Description   Final    BLOOD RIGHT HAND Performed at [facility], [address], [place], [place] [address]    Special Requests   Final    BOTTLES DRAWN AEROBIC ONLY Blood Culture results may not be optimal due to an inadequate volume of blood received in culture bottles Performed at [facility], [address] [place], [place] [address]    Culture   Final    NO GROWTH 5 DAYS Performed at [facility], [address] [place], [place] [address]    Report Status 04/25/2023 FINAL  Final  Culture, blood (Routine X 2) w Reflex to ID Panel  Status: None   Collection Time: 04/20/23  8:23 AM   Specimen: BLOOD RIGHT ARM  Result Value Ref Range Status   Specimen Description   Final    BLOOD RIGHT ARM Performed at [facility], [address], [place], [place] [address]    Special Requests   Final    BOTTLES DRAWN AEROBIC ONLY Blood Culture results may not be optimal due to an inadequate volume of blood received in culture bottles Performed at [facility], [address] [place] [address] [place], [place] [address]    Culture   Final    NO GROWTH 5 DAYS Performed at [facility], [address], [place], [place] [address]    Report Status 04/25/2023 FINAL  Final    Labs: CBC: Recent Labs  Lab 04/27/23 0437 04/28/23 0503 04/29/23 0824 04/30/23 0426 05/01/23 0512  WBC 11.0* 13.1* 13.3* 13.5* 14.1*  HGB 7.8* 7.7* 8.0* 7.4* 7.8*  HCT 24.3* 25.2* 26.4* 24.2* 24.9*  MCV 97.2 100.4* 101.1* 99.6  96.1  PLT 467* 430* 429* 391 404*   Basic Metabolic Panel: Recent Labs  Lab 04/25/23 0454 04/26/23 0411 04/27/23 0437 04/28/23 0503 04/29/23 0824 04/30/23 0426 05/01/23 0512  NA 142 145 143 143 136 137 135  K 3.6 3.5 3.3* 3.1* 3.5 3.5 4.1  CL 108 113* 111 113* 111 109 104  CO2 25 23 23  21* 18* 20* 25  GLUCOSE 109* 111* 116* 109* 83 83 121*  BUN 35* 32* 31* 25* 17 14 17   CREATININE 1.04 0.98 1.00 0.99 0.68 0.83 0.98  CALCIUM 8.7* 8.5* 8.5* 8.1* 7.8* 8.0* 8.0*  MG 2.1 2.1 2.0 1.9  --   --   --   PHOS  --  2.8 3.1 3.0  --   --   --    Liver Function Tests: Recent Labs  Lab 04/26/23 0411 04/27/23 0437 04/28/23 0503 04/29/23 0824 04/30/23 0426  AST 111* 206* 110* 57* 39  ALT 217* 224* 177* 141* 102*  ALKPHOS 100 92 85 81 76  BILITOT 1.5* 1.5* 1.1 1.1 1.2  PROT 6.5 6.1* 5.8* 5.9* 5.5*  ALBUMIN 2.0* 2.1* 1.9* 2.1* 2.0*   CBG: Recent Labs  Lab 04/26/23 0738  GLUCAP 112*    Discharge time spent: greater than 30 minutes.  Signed: Alba Cory, MD [facility] 05/01/2023

## 2023-05-01 NOTE — TOC Transition Note (Signed)
 Transition of Care [facility]) - Discharge Note   Patient Details  Name: Luke Foster MRN: [id number] Date of Birth: 03-May-1950  Transition of Care [facility]) CM/SW Contact:  Adrian Prows, RN Phone Number: 05/01/2023, 8:56 AM   Clinical Narrative:    D/C orders received; HHPT/OT previously arranged w/ Frances Furbish; Cory at agency notified; no TOC needs.   Final next level of care: Home w Home Health Services Barriers to Discharge: No Barriers Identified   Patient Goals and CMS Choice Patient states their goals for this hospitalization and ongoing recovery are:: return home with [facility] services through [facility]          Discharge Placement                       Discharge Plan and Services Additional resources added to the After Visit Summary for                            [facility] Arranged: PT, OT [facility] Agency: [facility] Date [facility] Agency Contacted: 04/30/23 Time HH Agency Contacted: 1512 Representative spoke with at [facility] Agency: Kandee Keen  Social Drivers of Health (SDOH) Interventions SDOH Screenings   Food Insecurity: No Food Insecurity (04/20/2023)  Housing: Low Risk  (04/20/2023)  Transportation Needs: No Transportation Needs (04/20/2023)  Utilities: Not At Risk (04/20/2023)  Social Connections: Moderately Isolated (04/20/2023)  Tobacco Use: Medium Risk (04/30/2023)     Readmission Risk Interventions    04/22/2023   10:55 AM 06/19/2022    1:54 PM 01/16/2022    1:14 PM  Readmission Risk Prevention Plan  Transportation Screening Complete Complete Complete  PCP or Specialist Appt within 3-5 Days Complete    HRI or Home Care Consult Complete Complete Complete  Social Work Consult for Recovery Care Planning/Counseling Complete Complete Complete  Palliative Care Screening Not Applicable Not Applicable Not Applicable  Medication Review Oceanographer) Complete Complete Complete

## 2023-05-01 NOTE — Progress Notes (Signed)
 Physical Therapy Treatment Patient Details Name: Luke Foster MRN: [id number] DOB: 1950-04-04 Today's Date: 05/01/2023   History of Present Illness 73 year old male who presented from home with c/o worsening back pain; had fall ~ 1 wk prior to adm. CT head negative.  CT abdomen/pelvi showed moderate right hydronephrosis, hydroureter, 9 mm obstructing stone in the distal right ureter, additional 6 mm stone, possible pyelonephritis. Urology consulted, s/p right ureteral stent on 3/11.  PMH: chronic back pain, chronic Foley catheter for urinary retention, nephrolithiasis, hypertension, depression, dementia .    PT Comments  Pt pleasant and eager to mobilize.  Pt assisted with ambulation and overall min assist (utilized +2 for recliner follow for safety).  Pt reports he will d/c home with his brother today.  Pt states they have multiple gait belts and w/c.  Recommended pt use w/c and/or have brother follow him with w/c if ambulating for safety, since pt has not really been able to ambulate this admission.   Current plan is for HHPT upon d/c (since pt/brother declined SNF recommendation).     If plan is discharge home, recommend the following: A little help with walking and/or transfers;A little help with bathing/dressing/bathroom;Assistance with cooking/housework;Assist for transportation;Help with stairs or ramp for entrance   Can travel by private vehicle        Equipment Recommendations  None recommended by PT    Recommendations for Other Services       Precautions / Restrictions Precautions Precautions: Fall     Mobility  Bed Mobility Overal bed mobility: Needs Assistance Bed Mobility: Supine to Sit     Supine to sit: Min assist, HOB elevated, Used rails     General bed mobility comments: light assist for trunk upright, increased time and effortful    Transfers Overall transfer level: Needs assistance Equipment used: Rolling walker (2 wheels) Transfers: Sit to/from  Stand Sit to Stand: Min assist, From elevated surface           General transfer comment: verbal cues for hand placement, assist to rise and stabilize    Ambulation/Gait Ambulation/Gait assistance: Min assist, +2 safety/equipment Gait Distance (Feet): 10 Feet Assistive device: Rolling walker (2 wheels) Gait Pattern/deviations: Step-through pattern, Decreased stride length, Trunk flexed Gait velocity: decr     General Gait Details: verbal cues for RW positioning and posture; recliner following for safety, assist due to weakness   Stairs             Wheelchair Mobility     Tilt Bed    Modified Rankin (Stroke Patients Only)       Balance                                            Communication Communication Communication: No apparent difficulties  Cognition Arousal: Alert Behavior During Therapy: WFL for tasks assessed/performed   PT - Cognitive impairments: History of cognitive impairments                       PT - Cognition Comments: hx of dementia, pleasant and following cues Following commands: Intact      Cueing Cueing Techniques: Verbal cues, Visual cues  Exercises      General Comments        Pertinent Vitals/Pain Pain Assessment Pain Assessment: No/denies pain Pain Intervention(s): Monitored during session, Repositioned    Home Living  Prior Function            PT Goals (current goals can now be found in the care plan section) Progress towards PT goals: Progressing toward goals    Frequency    Min 2X/week      PT Plan      Co-evaluation              AM-PAC PT "6 Clicks" Mobility   Outcome Measure  Help needed turning from your back to your side while in a flat bed without using bedrails?: A Little Help needed moving from lying on your back to sitting on the side of a flat bed without using bedrails?: A Little Help needed moving to and from a bed to a  chair (including a wheelchair)?: A Little Help needed standing up from a chair using your arms (e.g., wheelchair or bedside chair)?: A Little Help needed to walk in hospital room?: A Lot Help needed climbing 3-5 steps with a railing? : A Lot 6 Click Score: 16    End of Session Equipment Utilized During Treatment: Gait belt Activity Tolerance: Patient tolerated treatment well Patient left: in chair;with call bell/phone within reach;with chair alarm set Nurse Communication: Mobility status PT Visit Diagnosis: Other abnormalities of gait and mobility (R26.89)     Time: 1610-9604 PT Time Calculation (min) (ACUTE ONLY): 10 min  Charges:    $Gait Training: 8-22 mins PT General Charges $$ ACUTE PT VISIT: 1 Visit                    Paulino Door, DPT Physical Therapist Acute Rehabilitation Services Office: [phone]    Janan Halter [place] 05/01/2023, 10:53 AM

## 2023-05-06 DIAGNOSIS — N202 Calculus of kidney with calculus of ureter: Secondary | ICD-10-CM | POA: Diagnosis not present

## 2023-05-10 DIAGNOSIS — N139 Obstructive and reflux uropathy, unspecified: Secondary | ICD-10-CM | POA: Diagnosis not present

## 2023-05-10 DIAGNOSIS — B9689 Other specified bacterial agents as the cause of diseases classified elsewhere: Secondary | ICD-10-CM | POA: Diagnosis not present

## 2023-05-10 DIAGNOSIS — N136 Pyonephrosis: Secondary | ICD-10-CM | POA: Diagnosis not present

## 2023-05-10 DIAGNOSIS — N202 Calculus of kidney with calculus of ureter: Secondary | ICD-10-CM | POA: Diagnosis not present

## 2023-05-10 DIAGNOSIS — R339 Retention of urine, unspecified: Secondary | ICD-10-CM | POA: Diagnosis not present

## 2023-05-11 ENCOUNTER — Encounter (HOSPITAL_COMMUNITY): Payer: Self-pay

## 2023-05-11 ENCOUNTER — Other Ambulatory Visit: Payer: Self-pay

## 2023-05-11 ENCOUNTER — Emergency Department (HOSPITAL_COMMUNITY)

## 2023-05-11 ENCOUNTER — Inpatient Hospital Stay (HOSPITAL_COMMUNITY)
Admission: EM | Admit: 2023-05-11 | Discharge: 2023-05-14 | DRG: 481 | Disposition: A | Discharge status: Skilled Nursing Facility | Attending: Internal Medicine | Admitting: Internal Medicine

## 2023-05-11 DIAGNOSIS — G8929 Other chronic pain: Secondary | ICD-10-CM | POA: Diagnosis present

## 2023-05-11 DIAGNOSIS — E440 Moderate protein-calorie malnutrition: Secondary | ICD-10-CM | POA: Diagnosis not present

## 2023-05-11 DIAGNOSIS — B9689 Other specified bacterial agents as the cause of diseases classified elsewhere: Secondary | ICD-10-CM | POA: Diagnosis not present

## 2023-05-11 DIAGNOSIS — N319 Neuromuscular dysfunction of bladder, unspecified: Secondary | ICD-10-CM | POA: Diagnosis present

## 2023-05-11 DIAGNOSIS — W010XXA Fall on same level from slipping, tripping and stumbling without subsequent striking against object, initial encounter: Secondary | ICD-10-CM | POA: Diagnosis present

## 2023-05-11 DIAGNOSIS — N179 Acute kidney failure, unspecified: Secondary | ICD-10-CM | POA: Diagnosis not present

## 2023-05-11 DIAGNOSIS — M1712 Unilateral primary osteoarthritis, left knee: Secondary | ICD-10-CM | POA: Diagnosis not present

## 2023-05-11 DIAGNOSIS — S72351A Displaced comminuted fracture of shaft of right femur, initial encounter for closed fracture: Secondary | ICD-10-CM | POA: Diagnosis not present

## 2023-05-11 DIAGNOSIS — Z87442 Personal history of urinary calculi: Secondary | ICD-10-CM

## 2023-05-11 DIAGNOSIS — D62 Acute posthemorrhagic anemia: Secondary | ICD-10-CM | POA: Diagnosis not present

## 2023-05-11 DIAGNOSIS — Z9012 Acquired absence of left breast and nipple: Secondary | ICD-10-CM

## 2023-05-11 DIAGNOSIS — S72401A Unspecified fracture of lower end of right femur, initial encounter for closed fracture: Secondary | ICD-10-CM | POA: Diagnosis not present

## 2023-05-11 DIAGNOSIS — I10 Essential (primary) hypertension: Secondary | ICD-10-CM | POA: Diagnosis not present

## 2023-05-11 DIAGNOSIS — E538 Deficiency of other specified B group vitamins: Secondary | ICD-10-CM | POA: Diagnosis present

## 2023-05-11 DIAGNOSIS — G2581 Restless legs syndrome: Secondary | ICD-10-CM | POA: Diagnosis present

## 2023-05-11 DIAGNOSIS — M6281 Muscle weakness (generalized): Secondary | ICD-10-CM | POA: Diagnosis not present

## 2023-05-11 DIAGNOSIS — M85862 Other specified disorders of bone density and structure, left lower leg: Secondary | ICD-10-CM | POA: Diagnosis not present

## 2023-05-11 DIAGNOSIS — Z79891 Long term (current) use of opiate analgesic: Secondary | ICD-10-CM | POA: Diagnosis not present

## 2023-05-11 DIAGNOSIS — R531 Weakness: Secondary | ICD-10-CM | POA: Diagnosis not present

## 2023-05-11 DIAGNOSIS — R2681 Unsteadiness on feet: Secondary | ICD-10-CM | POA: Diagnosis not present

## 2023-05-11 DIAGNOSIS — R6510 Systemic inflammatory response syndrome (SIRS) of non-infectious origin without acute organ dysfunction: Secondary | ICD-10-CM | POA: Diagnosis present

## 2023-05-11 DIAGNOSIS — R339 Retention of urine, unspecified: Secondary | ICD-10-CM | POA: Diagnosis not present

## 2023-05-11 DIAGNOSIS — R6889 Other general symptoms and signs: Secondary | ICD-10-CM | POA: Diagnosis not present

## 2023-05-11 DIAGNOSIS — M84451D Pathological fracture, right femur, subsequent encounter for fracture with routine healing: Secondary | ICD-10-CM | POA: Diagnosis not present

## 2023-05-11 DIAGNOSIS — Z978 Presence of other specified devices: Secondary | ICD-10-CM | POA: Diagnosis not present

## 2023-05-11 DIAGNOSIS — E782 Mixed hyperlipidemia: Secondary | ICD-10-CM | POA: Diagnosis not present

## 2023-05-11 DIAGNOSIS — Z803 Family history of malignant neoplasm of breast: Secondary | ICD-10-CM

## 2023-05-11 DIAGNOSIS — Z881 Allergy status to other antibiotic agents status: Secondary | ICD-10-CM

## 2023-05-11 DIAGNOSIS — S72451A Displaced supracondylar fracture without intracondylar extension of lower end of right femur, initial encounter for closed fracture: Principal | ICD-10-CM | POA: Diagnosis present

## 2023-05-11 DIAGNOSIS — S72401D Unspecified fracture of lower end of right femur, subsequent encounter for closed fracture with routine healing: Secondary | ICD-10-CM | POA: Diagnosis not present

## 2023-05-11 DIAGNOSIS — M1711 Unilateral primary osteoarthritis, right knee: Secondary | ICD-10-CM | POA: Diagnosis not present

## 2023-05-11 DIAGNOSIS — Z043 Encounter for examination and observation following other accident: Secondary | ICD-10-CM | POA: Diagnosis not present

## 2023-05-11 DIAGNOSIS — M8580 Other specified disorders of bone density and structure, unspecified site: Secondary | ICD-10-CM | POA: Diagnosis not present

## 2023-05-11 DIAGNOSIS — W19XXXA Unspecified fall, initial encounter: Principal | ICD-10-CM

## 2023-05-11 DIAGNOSIS — S7291XA Unspecified fracture of right femur, initial encounter for closed fracture: Secondary | ICD-10-CM | POA: Diagnosis not present

## 2023-05-11 DIAGNOSIS — Z87891 Personal history of nicotine dependence: Secondary | ICD-10-CM | POA: Diagnosis not present

## 2023-05-11 DIAGNOSIS — F0390 Unspecified dementia without behavioral disturbance: Secondary | ICD-10-CM | POA: Diagnosis present

## 2023-05-11 DIAGNOSIS — G4733 Obstructive sleep apnea (adult) (pediatric): Secondary | ICD-10-CM | POA: Diagnosis not present

## 2023-05-11 DIAGNOSIS — S79929A Unspecified injury of unspecified thigh, initial encounter: Secondary | ICD-10-CM | POA: Diagnosis not present

## 2023-05-11 DIAGNOSIS — Z7401 Bed confinement status: Secondary | ICD-10-CM | POA: Diagnosis not present

## 2023-05-11 DIAGNOSIS — S72461A Displaced supracondylar fracture with intracondylar extension of lower end of right femur, initial encounter for closed fracture: Secondary | ICD-10-CM | POA: Diagnosis not present

## 2023-05-11 DIAGNOSIS — G629 Polyneuropathy, unspecified: Secondary | ICD-10-CM | POA: Diagnosis not present

## 2023-05-11 DIAGNOSIS — Z6827 Body mass index (BMI) 27.0-27.9, adult: Secondary | ICD-10-CM | POA: Diagnosis not present

## 2023-05-11 DIAGNOSIS — R600 Localized edema: Secondary | ICD-10-CM | POA: Diagnosis not present

## 2023-05-11 DIAGNOSIS — S72302A Unspecified fracture of shaft of left femur, initial encounter for closed fracture: Secondary | ICD-10-CM | POA: Diagnosis not present

## 2023-05-11 DIAGNOSIS — R5381 Other malaise: Secondary | ICD-10-CM | POA: Diagnosis not present

## 2023-05-11 DIAGNOSIS — Z86718 Personal history of other venous thrombosis and embolism: Secondary | ICD-10-CM | POA: Diagnosis not present

## 2023-05-11 DIAGNOSIS — Z83438 Family history of other disorder of lipoprotein metabolism and other lipidemia: Secondary | ICD-10-CM

## 2023-05-11 DIAGNOSIS — M14861 Arthropathies in other specified diseases classified elsewhere, right knee: Secondary | ICD-10-CM | POA: Diagnosis not present

## 2023-05-11 DIAGNOSIS — M25562 Pain in left knee: Secondary | ICD-10-CM | POA: Diagnosis not present

## 2023-05-11 DIAGNOSIS — G4730 Sleep apnea, unspecified: Secondary | ICD-10-CM | POA: Diagnosis present

## 2023-05-11 DIAGNOSIS — F418 Other specified anxiety disorders: Secondary | ICD-10-CM | POA: Diagnosis not present

## 2023-05-11 DIAGNOSIS — M545 Low back pain, unspecified: Secondary | ICD-10-CM | POA: Diagnosis not present

## 2023-05-11 DIAGNOSIS — Z8249 Family history of ischemic heart disease and other diseases of the circulatory system: Secondary | ICD-10-CM

## 2023-05-11 DIAGNOSIS — Z743 Need for continuous supervision: Secondary | ICD-10-CM | POA: Diagnosis not present

## 2023-05-11 DIAGNOSIS — D649 Anemia, unspecified: Secondary | ICD-10-CM | POA: Diagnosis not present

## 2023-05-11 DIAGNOSIS — E785 Hyperlipidemia, unspecified: Secondary | ICD-10-CM | POA: Diagnosis not present

## 2023-05-11 DIAGNOSIS — J9811 Atelectasis: Secondary | ICD-10-CM | POA: Diagnosis not present

## 2023-05-11 DIAGNOSIS — S80919A Unspecified superficial injury of unspecified knee, initial encounter: Secondary | ICD-10-CM | POA: Diagnosis not present

## 2023-05-11 DIAGNOSIS — S72491A Other fracture of lower end of right femur, initial encounter for closed fracture: Secondary | ICD-10-CM

## 2023-05-11 DIAGNOSIS — L89322 Pressure ulcer of left buttock, stage 2: Secondary | ICD-10-CM | POA: Diagnosis not present

## 2023-05-11 DIAGNOSIS — N136 Pyonephrosis: Secondary | ICD-10-CM | POA: Diagnosis not present

## 2023-05-11 DIAGNOSIS — R609 Edema, unspecified: Secondary | ICD-10-CM | POA: Diagnosis not present

## 2023-05-11 DIAGNOSIS — N139 Obstructive and reflux uropathy, unspecified: Secondary | ICD-10-CM | POA: Diagnosis not present

## 2023-05-11 DIAGNOSIS — G894 Chronic pain syndrome: Secondary | ICD-10-CM | POA: Diagnosis not present

## 2023-05-11 DIAGNOSIS — R278 Other lack of coordination: Secondary | ICD-10-CM | POA: Diagnosis not present

## 2023-05-11 DIAGNOSIS — J90 Pleural effusion, not elsewhere classified: Secondary | ICD-10-CM | POA: Diagnosis not present

## 2023-05-11 LAB — BRAIN NATRIURETIC PEPTIDE: B Natriuretic Peptide: 91 pg/mL (ref 0.0–100.0)

## 2023-05-11 LAB — COMPREHENSIVE METABOLIC PANEL WITH GFR
ALT: 43 U/L (ref 0–44)
AST: 40 U/L (ref 15–41)
Albumin: 2 g/dL — ABNORMAL LOW (ref 3.5–5.0)
Alkaline Phosphatase: 111 U/L (ref 38–126)
Anion gap: 17 — ABNORMAL HIGH (ref 5–15)
BUN: 26 mg/dL — ABNORMAL HIGH (ref 8–23)
CO2: 21 mmol/L — ABNORMAL LOW (ref 22–32)
Calcium: 8.6 mg/dL — ABNORMAL LOW (ref 8.9–10.3)
Chloride: 96 mmol/L — ABNORMAL LOW (ref 98–111)
Creatinine, Ser: 1.22 mg/dL (ref 0.61–1.24)
GFR, Estimated: >60 mL/min (ref >60)
Glucose, Bld: 98 mg/dL (ref 70–99)
Potassium: 3.6 mmol/L (ref 3.5–5.1)
Sodium: 134 mmol/L — ABNORMAL LOW (ref 135–145)
Total Bilirubin: 0.6 mg/dL (ref 0.0–1.2)
Total Protein: 7.2 g/dL (ref 6.5–8.1)

## 2023-05-11 LAB — CBC
HCT: 24.5 % — ABNORMAL LOW (ref 39.0–52.0)
Hemoglobin: 7.6 g/dL — ABNORMAL LOW (ref 13.0–17.0)
MCH: 30.3 pg (ref 26.0–34.0)
MCHC: 31 g/dL (ref 30.0–36.0)
MCV: 97.6 fL (ref 80.0–100.0)
Platelets: 382 10*3/uL (ref 150–400)
RBC: 2.51 MIL/uL — ABNORMAL LOW (ref 4.22–5.81)
RDW: 14.5 % (ref 11.5–15.5)
WBC: 16.7 10*3/uL — ABNORMAL HIGH (ref 4.0–10.5)
nRBC: 0 % (ref 0.0–0.2)

## 2023-05-11 MED ORDER — SENNOSIDES-DOCUSATE SODIUM 8.6-50 MG PO TABS: 1.0000 | ORAL_TABLET | Freq: Every evening | ORAL | Status: DC | PRN

## 2023-05-11 MED ORDER — VENLAFAXINE HCL ER 75 MG PO CP24
75.0000 mg | ORAL_CAPSULE | Freq: Two times a day (BID) | ORAL | Status: DC
  Administered 2023-05-11: 75 mg via ORAL
  Filled 2023-05-11: qty 1

## 2023-05-11 MED ORDER — OXYCODONE-ACETAMINOPHEN 5-325 MG PO TABS
2.0000 | ORAL_TABLET | Freq: Once | ORAL | Status: AC
  Administered 2023-05-11: 2 via ORAL
  Filled 2023-05-11: qty 2

## 2023-05-11 MED ORDER — OXYCODONE-ACETAMINOPHEN 5-325 MG PO TABS
1.0000 | ORAL_TABLET | ORAL | Status: DC | PRN
  Filled 2023-05-11: qty 1

## 2023-05-11 MED ORDER — HEPARIN SODIUM (PORCINE) 5000 UNIT/ML IJ SOLN
5000.0000 [IU] | Freq: Once | INTRAMUSCULAR | Status: AC
  Administered 2023-05-11: 5000 [IU] via SUBCUTANEOUS
  Filled 2023-05-11: qty 1

## 2023-05-11 MED ORDER — OXYCODONE HCL 5 MG PO TABS: 2.5000 mg | ORAL_TABLET | ORAL | Status: DC | PRN

## 2023-05-11 MED ORDER — OXYCODONE-ACETAMINOPHEN 7.5-325 MG PO TABS: 1.0000 | ORAL_TABLET | ORAL | Status: DC | PRN

## 2023-05-11 MED ORDER — LIDOCAINE 5 % EX PTCH
1.0000 | MEDICATED_PATCH | Freq: Once | CUTANEOUS | Status: AC
Start: 2023-05-11 — End: 2023-05-12
  Administered 2023-05-11: 1 via TRANSDERMAL
  Filled 2023-05-11: qty 1

## 2023-05-11 MED ORDER — MIRABEGRON ER 25 MG PO TB24
25.0000 mg | ORAL_TABLET | Freq: Every day | ORAL | Status: DC
  Administered 2023-05-11 – 2023-05-13 (×3): 25 mg via ORAL
  Filled 2023-05-11 (×4): qty 1

## 2023-05-11 MED ORDER — MORPHINE SULFATE (PF) 2 MG/ML IV SOLN
2.0000 mg | Freq: Once | INTRAVENOUS | Status: DC
  Filled 2023-05-11: qty 1

## 2023-05-11 MED ORDER — MORPHINE SULFATE (PF) 2 MG/ML IV SOLN
2.0000 mg | INTRAVENOUS | Status: DC | PRN
Start: 2023-05-11 — End: 2023-05-12
  Administered 2023-05-11 – 2023-05-12 (×4): 2 mg via INTRAVENOUS
  Filled 2023-05-11 (×4): qty 1

## 2023-05-11 MED ORDER — TAMSULOSIN HCL 0.4 MG PO CAPS
0.4000 mg | ORAL_CAPSULE | Freq: Every day | ORAL | Status: DC
  Administered 2023-05-11 – 2023-05-13 (×3): 0.4 mg via ORAL
  Filled 2023-05-11 (×3): qty 1

## 2023-05-11 MED ORDER — VIBEGRON 75 MG PO TABS: 50.0000 mg | ORAL_TABLET | Freq: Every day | ORAL | Status: DC

## 2023-05-11 MED ORDER — DILTIAZEM HCL ER 120 MG PO CP24
120.0000 mg | ORAL_CAPSULE | Freq: Every day | ORAL | Status: DC
Start: 2023-05-11 — End: 2023-05-11

## 2023-05-11 MED ORDER — DILTIAZEM HCL ER COATED BEADS 120 MG PO CP24
120.0000 mg | ORAL_CAPSULE | Freq: Every day | ORAL | Status: DC
  Administered 2023-05-11: 120 mg via ORAL
  Filled 2023-05-11: qty 1

## 2023-05-11 NOTE — Assessment & Plan Note (Signed)
Resume ropinirole 

## 2023-05-11 NOTE — Assessment & Plan Note (Addendum)
 Status post mechanical fall.  Right femur x-ray shows comminuted displaced fracture of the distal right femoral diaphysis.  Patient requesting surgery to be done at [facility]. -Scheduled for surgery tomorrow with Dr. Jena Gauss at [facility]. -N.p.o. midnight -IV morphine 2 mg every 4 hours as needed -Preop -EKG -Leukocytosis -16.7, likely stress reaction, trend for now. He has a chronic indwelling Foley catheter.

## 2023-05-11 NOTE — Assessment & Plan Note (Addendum)
 Stable.  Lives at home. -Resume Effexor.

## 2023-05-11 NOTE — Plan of Care (Signed)
   Problem: Health Behavior/Discharge Planning: Goal: Ability to manage health-related needs will improve Outcome: Progressing

## 2023-05-11 NOTE — H&P (Addendum)
 History and Physical    Luke Foster:[id number] DOB: 1950-07-14 DOA: 05/11/2023  PCP: Elfredia Nevins, MD   Patient coming from: Home  I have personally briefly reviewed patient's old medical records in [facility]  Chief Complaint: Fall  HPI: Luke Foster is a 73 y.o. male with medical history significant for obstructive uropathy, neurogenic bladder with chronic indwelling Foley catheter, hypertension, restless leg syndrome, dementia. Patient presented to the ED with complaints of fall with subsequent knee pain.  Reports he was ambulating with his walker, trying to turn around lost his balance and fell onto his knees, his caretaker was with him, he did not hit his head.  EMS was called to his home to assist him of the floor, he declined transport, but due to worsening bilateral knee pain today, he presented to the ED. He denies chest pain no difficulty breathing, no cough no fevers no no dizziness.  Patient tells me he has chronic bilateral lower extremity swelling that is unchanged, patient has brought a list that presents reports onset about 2 months ago, fluctuates, but remained persistent over the past 2 weeks.  Hospitalization 3/10 - 3/22 for pyelonephritis, nephrolithiasis with stent placement 3/11.  Cultures grew Enterobacter.  Stay was complicated by small bowel obstruction managed conservatively.  Had worsening transaminitis and abdominal pain ultrasound showed mild gallbladder thickening, HIDA was ordered to rule out cholecystitis.  ED Course: Stable vitals.  WBC 16.7.  Serum bicarb 21, anion gap of 17. Pelvic x-ray, right knee x-ray and right femur x-ray show-comminuted displaced fracture of the distal right femoral diaphysis. Patient requested that he wanted his surgery done at [facility]. EDP had already talked to Dr. Dallas Schimke here at [facility], subsequently called Dr. Lajoyce Corners, at [facility], who requested calling Dr. Charlann Boxer, patient was scheduled for surgery with Dr. Jena Gauss  tomorrow at [facility].  Review of Systems: As per HPI all other systems reviewed and negative.  Past Medical History:  Diagnosis Date   Arthritis    Chronic back pain    Chronic pain 1999   Bilateral feet (R >L)   Dementia (HCC) 04/19/2023   Difficult or painful urination    High blood pressure    History of kidney stones    H/O   Nerve damage    Pain management    Paresthesia of foot, bilateral    Renal disorder    Sleep apnea    USES CPAP   Weakness of both legs     Past Surgical History:  Procedure Laterality Date   arm surgery     BACK SURGERY     X2   BOTOX INJECTION N/A 10/06/2021   Procedure: CYSTOSCOPY LITHOLAPAXY WITH BOTOX INJECTION;  Surgeon: Crista Elliot, MD;  Location: [facility];  Service: Urology;  Laterality: N/A;   BOTOX INJECTION N/A 04/20/2022   Procedure: CYSTOSCOPY & BOTOX INJECTION, BLADDER FULGARATION;  Surgeon: Crista Elliot, MD;  Location: [facility];  Service: Urology;  Laterality: N/A;  45 MINS   BOTOX INJECTION N/A 12/28/2022   Procedure: CYSTOSCOPY WITH INTRA DETRUSOR BOTOX INJECTION;  Surgeon: Crista Elliot, MD;  Location: [facility];  Service: Urology;  Laterality: N/A;  45 MINS FOR CASE   BOWEL RESECTION N/A 01/15/2022   Procedure: SMALL BOWEL RESECTION;  Surgeon: Lewie Chamber, DO;  Location: [facility];  Service: General;  Laterality: N/A;   CYSTOSCOPY W/ URETERAL STENT PLACEMENT Right 11/29/2021   Procedure: CYSTOSCOPY WITH RETROGRADE PYELOGRAM/URETERAL  STENT PLACEMENT;  Surgeon: Bjorn Pippin, MD;  Location: [facility];  Service: Urology;  Laterality: Right;   CYSTOSCOPY WITH STENT PLACEMENT Right 04/20/2023   Procedure: CYSTOSCOPY, RETROGRADE PYELOGRAM, RIGHT STENT INSERTION;  Surgeon: Despina Arias, MD;  Location: [facility];  Service: Urology;  Laterality: Right;   CYSTOSCOPY/URETEROSCOPY/HOLMIUM LASER/STENT PLACEMENT Right 01/05/2022   Procedure: CYSTOSCOPY RIGHT URETEROSCOPY/HOLMIUM LASER/STENT PLACEMENT basket extraction stones;   Surgeon: Crista Elliot, MD;  Location: [facility];  Service: Urology;  Laterality: Right;  1 HR FOR CASE   CYSTOSCOPY/URETEROSCOPY/HOLMIUM LASER/STENT PLACEMENT Right 04/30/2023   Procedure: CYSTOSCOPY/URETEROSCOPY/HOLMIUM LASER/STENT PLACEMENT;  Surgeon: Crista Elliot, MD;  Location: [facility];  Service: Urology;  Laterality: Right;   FACIAL COSMETIC SURGERY     HERNIA REPAIR     IR US GUIDE BX ASP/DRAIN  10/19/2017   KIDNEY STONE SURGERY     LAPAROTOMY N/A 01/15/2022   Procedure: Exploratory laparotomy;  Surgeon: Lewie Chamber, DO;  Location: [facility];  Service: General;  Laterality: N/A;   LUMBAR SPINAL CORD SIMULATOR LEAD REMOVAL N/A 03/01/2019   Procedure: LUMBAR SPINAL CORD SIMULATOR LEAD REMOVAL;  Surgeon: Venetia Night, MD;  Location: [facility];  Service: Neurosurgery;  Laterality: N/A;   MASTECTOMY Left    SPINAL CORD STIMULATOR REMOVAL N/A 03/01/2019   Procedure: LUMBAR SPINAL CORD STIMULATOR REMOVAL;  Surgeon: Venetia Night, MD;  Location: [facility];  Service: Neurosurgery;  Laterality: N/A;   WISDOM TOOTH EXTRACTION       reports that he quit smoking about 31 years ago. His smoking use included cigarettes. He started smoking about 42 years ago. He has a 20 pack-year smoking history. He quit smokeless tobacco use about 31 years ago. He reports current alcohol use. He reports that he does not use drugs.  Allergies  Allergen Reactions   Erythromycin Itching    Family History  Problem Relation Age of Onset   Heart disease Father        Living, 43   Breast cancer Mother        Living, 87   Hypercholesterolemia Mother    Healthy Brother    Healthy Sister    Prior to Admission medications   Medication Sig Start Date End Date Taking? Authorizing Provider  acetaminophen (TYLENOL) 500 MG tablet Take 1,000 mg by mouth every 6 (six) hours as needed for mild pain or moderate pain.    [provider]  ascorbic acid (VITAMIN C) 500 MG tablet Take 500 mg by  mouth daily.    [provider]  Cholecalciferol (VITAMIN D) 50 MCG (2000 UT) CAPS Take 2,000 Units by mouth daily.    [provider]  cyanocobalamin (VITAMIN B12) 1000 MCG tablet Take 1,000 mcg by mouth in the morning and at bedtime.    [provider]  DILT-XR 120 MG 24 hr capsule Take 120 mg by mouth daily. 12/07/22   [provider]  docusate sodium (COLACE) 100 MG capsule Take 1 capsule (100 mg total) by mouth in the morning, at noon, and at bedtime. Patient taking differently: Take 300 mg by mouth at bedtime. 05/17/20   Medina-Vargas, Monina C, NP  ferrous sulfate 325 (65 FE) MG tablet Take 1 tablet (325 mg total) by mouth daily at 8 pm. 05/01/23   Regalado, Jon Billings A, MD  folic acid (FOLVITE) 1 MG tablet Take 1 tablet (1 mg total) by mouth daily. 05/02/23   Regalado, Belkys A, MD  lidocaine (LIDODERM) 5 % Place 1 patch onto  the skin daily as needed (pain). 11/19/22   [provider]  oxyCODONE (ROXICODONE) 15 MG immediate release tablet Take 1 tablet (15 mg total) by mouth every 6 (six) hours as needed for pain. Patient taking differently: Take 15 mg by mouth every 4 (four) hours as needed for pain. 05/17/20   Medina-Vargas, Monina C, NP  polyethylene glycol (MIRALAX / GLYCOLAX) 17 g packet Take 17 g by mouth 2 (two) times daily. 05/17/20   Medina-Vargas, Monina C, NP  rOPINIRole (REQUIP) 0.5 MG tablet Take 0.5 mg by mouth at bedtime as needed (restless legs). 09/22/21   [provider]  senna-docusate (SENOKOT-S) 8.6-50 MG tablet Take 1 tablet by mouth 2 (two) times daily. 05/01/23   Regalado, Belkys A, MD  solifenacin (VESICARE) 5 MG tablet Take 5 mg by mouth daily. 07/17/21   [provider]  tamsulosin (FLOMAX) 0.4 MG CAPS capsule Take 1 capsule (0.4 mg total) by mouth every evening. Patient taking differently: Take 0.4 mg by mouth at bedtime. 05/17/20   Medina-Vargas, Monina C, NP  venlafaxine XR (EFFEXOR-XR) 75 MG 24 hr capsule Take 1  capsule (75 mg total) by mouth in the morning and at bedtime. 05/17/20   Medina-Vargas, Monina C, NP  Vibegron (GEMTESA PO) Take 50 mg by mouth at bedtime.    [provider]    Physical Exam: Vitals:   05/11/23 1231 05/11/23 1234  BP: 117/73   Pulse: 97   Resp: 19   Temp: 98.4 F (36.9 C)   TempSrc: Oral   SpO2: 97%   Weight:  81.6 kg  Height:  5\' 8"  (1.727 m)    Constitutional: NAD, calm, comfortable Vitals:   05/11/23 1231 05/11/23 1234  BP: 117/73   Pulse: 97   Resp: 19   Temp: 98.4 F (36.9 C)   TempSrc: Oral   SpO2: 97%   Weight:  81.6 kg  Height:  5\' 8"  (1.727 m)   Eyes: PERRL, lids and conjunctivae normal ENMT: Mucous membranes are moist.   Neck: normal, supple, no masses, no thyromegaly Respiratory: clear to auscultation bilaterally, no wheezing, no crackles. Normal respiratory effort. No accessory muscle use.  Cardiovascular: Regular rate and rhythm, no murmurs / rubs / gallops.  At least 2+ bilateral pitting lower extremity edema to knees, extremity edema.  Per patient this is his baseline.  Extremities warm.  Abdomen: no tenderness, no masses palpated. No hepatosplenomegaly.  Chronic indwelling Foley catheter. Musculoskeletal: no clubbing / cyanosis. No joint deformity upper and lower extremities.  Skin: no rashes, lesions, ulcers. No induration Neurologic: No facial symmetry, moving extremities spontaneously, speech fluent. Psychiatric: Normal judgment and insight. Alert and oriented x 3. Normal mood.   Labs on Admission: I have personally reviewed following labs and imaging studies  CBC: Recent Labs  Lab 05/11/23 1406  WBC 16.7*  HGB 7.6*  HCT 24.5*  MCV 97.6  PLT 382   Basic Metabolic Panel: Recent Labs  Lab 05/11/23 1406  NA 134*  K 3.6  CL 96*  CO2 21*  GLUCOSE 98  BUN 26*  CREATININE 1.22  CALCIUM 8.6*   GFR: Estimated Creatinine Clearance: 53.7 mL/min (by C-G formula based on SCr of 1.22 mg/dL). Liver Function  Tests: Recent Labs  Lab 05/11/23 1406  AST 40  ALT 43  ALKPHOS 111  BILITOT 0.6  PROT 7.2  ALBUMIN 2.0*   Urine analysis:    Component Value Date/Time   COLORURINE AMBER (A) 04/19/2023 1623   APPEARANCEUR CLOUDY (A)  04/19/2023 1623   LABSPEC 1.026 04/19/2023 1623   PHURINE 5.0 04/19/2023 1623   GLUCOSEU NEGATIVE 04/19/2023 1623   HGBUR LARGE (A) 04/19/2023 1623   BILIRUBINUR NEGATIVE 04/19/2023 1623   KETONESUR NEGATIVE 04/19/2023 1623   PROTEINUR 100 (A) 04/19/2023 1623   UROBILINOGEN 1.0 08/16/2009 1138   NITRITE NEGATIVE 04/19/2023 1623   LEUKOCYTESUR MODERATE (A) 04/19/2023 1623    Radiological Exams on Admission: DG Knee 2 Views Right Result Date: 05/11/2023 CLINICAL DATA:  Fall and right lower extremity pain. EXAM: DG HIP (WITH OR WITHOUT PELVIS) 2-3V RIGHT; RIGHT FEMUR 2 VIEWS; RIGHT KNEE - 1-2 VIEW COMPARISON:  None Available. FINDINGS: Evaluation is limited due to osteopenia and body habitus. There is a comminuted, oblique fracture of the distal right femoral diaphysis with approximately 2 cm medial and posterior displacement of the distal fracture fragment. No dislocation. Mild arthritic changes of the knee. The soft tissues are unremarkable. IMPRESSION: Comminuted, displaced fracture of the distal right femoral diaphysis. Electronically Signed   By: Elgie Collard M.D.   On: 05/11/2023 15:36   DG Hip Unilat W or Wo Pelvis 2-3 Views Right Result Date: 05/11/2023 CLINICAL DATA:  Fall and right lower extremity pain. EXAM: DG HIP (WITH OR WITHOUT PELVIS) 2-3V RIGHT; RIGHT FEMUR 2 VIEWS; RIGHT KNEE - 1-2 VIEW COMPARISON:  None Available. FINDINGS: Evaluation is limited due to osteopenia and body habitus. There is a comminuted, oblique fracture of the distal right femoral diaphysis with approximately 2 cm medial and posterior displacement of the distal fracture fragment. No dislocation. Mild arthritic changes of the knee. The soft tissues are unremarkable. IMPRESSION:  Comminuted, displaced fracture of the distal right femoral diaphysis. Electronically Signed   By: Elgie Collard M.D.   On: 05/11/2023 15:36   DG Femur Min 2 Views Right Result Date: 05/11/2023 CLINICAL DATA:  Fall and right lower extremity pain. EXAM: DG HIP (WITH OR WITHOUT PELVIS) 2-3V RIGHT; RIGHT FEMUR 2 VIEWS; RIGHT KNEE - 1-2 VIEW COMPARISON:  None Available. FINDINGS: Evaluation is limited due to osteopenia and body habitus. There is a comminuted, oblique fracture of the distal right femoral diaphysis with approximately 2 cm medial and posterior displacement of the distal fracture fragment. No dislocation. Mild arthritic changes of the knee. The soft tissues are unremarkable. IMPRESSION: Comminuted, displaced fracture of the distal right femoral diaphysis. Electronically Signed   By: Elgie Collard M.D.   On: 05/11/2023 15:36   DG Chest 2 View Result Date: 05/11/2023 CLINICAL DATA:  fall EXAM: CHEST - 2 VIEW COMPARISON:  04/19/2023 FINDINGS: Improving linear opacities at the left base. Mild atelectasis at the right lung base. Heart size and mediastinal contours are within normal limits. Blunting of posterior costophrenic angles suggesting small effusions, new since previous. Visualized bones unremarkable. IMPRESSION: 1. Small bilateral pleural effusions. 2. Improving left basilar atelectasis. Electronically Signed   By: Corlis Leak M.D.   On: 05/11/2023 15:35   DG Knee 2 Views Left Result Date: 05/11/2023 CLINICAL DATA:  Status post fall with knee pain EXAM: LEFT KNEE - 2 VIEW COMPARISON:  None Available. FINDINGS: Diffuse osteopenia. There are no findings of fracture or dislocation. No joint effusion. Mild tricompartmental degenerative changes of the knee. Diffuse subcutaneous soft tissue reticulations. IMPRESSION: 1. No acute displaced fracture or dislocation. 2. Mild tricompartmental degenerative changes of the knee. Electronically Signed   By: Agustin Cree M.D.   On: 05/11/2023 15:34   EKG:  None  Assessment/Plan Principal Problem:   Closed fracture of right  distal femur (HCC) Active Problems:   Bilateral lower extremity edema   Benign essential HTN   Restless leg syndrome   Dementia (HCC)   Chronic indwelling Foley catheter   Assessment and Plan: * Closed fracture of right distal femur (HCC) Status post mechanical fall.  Right femur x-ray shows comminuted displaced fracture of the distal right femoral diaphysis.  Patient requesting surgery to be done at [facility]. -Scheduled for surgery tomorrow with Dr. Jena Gauss at [facility]. -N.p.o. midnight -IV morphine 2 mg every 4 hours as needed -Preop -EKG -Leukocytosis -16.7, likely stress reaction, trend for now. He has a chronic indwelling Foley catheter.  Bilateral lower extremity edema Significant bilateral lower extremity swelling- x 2 months per patient's brother.  Chest x-ray with small bilateral pleural effusions.  Last bilateral lower extremity venous ultrasound for swelling was 2022 and were negative for DVT.  No echo on file.  Per charts weight appears stable. -Obtain echocardiogram -Check BNP -Obtain EKG  Dementia (HCC) Stable.  Lives at home. -Resume Effexor.  Restless leg syndrome Resume ropinirole.  Benign essential HTN Stable.  Resume home diltiazem, tamsulosin   DVT prophylaxis: Heparin x1, SCDs Code Status: FULL code Family Communication: Brother [name] who is present with me, is healthcare power of attorney, was later present at bedside. Disposition Plan: ~ 2 days Consults called: Ortho Admission status: Inpt Med surg I certify that at the point of admission it is my clinical judgment that the patient will require inpatient hospital care spanning beyond 2 midnights from the point of admission due to high intensity of service, high risk for further deterioration and high frequency of surveillance required.   Author: Onnie Boer, MD 05/11/2023 6:35 PM  For on call review  [email or web address].

## 2023-05-11 NOTE — ED Notes (Signed)
 Report given and accepted by [facility]

## 2023-05-11 NOTE — Anesthesia Preprocedure Evaluation (Signed)
 Anesthesia Evaluation  Patient identified by MRN, date of birth, ID band Patient awake    Reviewed: Allergy & Precautions, NPO status , Patient's Chart, lab work & pertinent test results  History of Anesthesia Complications Negative for: history of anesthetic complications  Airway Mallampati: III  TM Distance: >3 FB Neck ROM: Full   Comment: Previous grade I view with Glidescope 4 Dental  (+) Dental Advisory Given   Pulmonary neg shortness of breath, sleep apnea , neg COPD, neg recent URI, former smoker   Pulmonary exam normal breath sounds clear to auscultation       Cardiovascular hypertension, (-) angina (-) Past MI, (-) Cardiac Stents and (-) CABG + dysrhythmias (RBBB)  Rhythm:Regular Rate:Normal  TTE 04/06/2019: INTERPRETATION  NORMAL LEFT VENTRICULAR SYSTOLIC FUNCTION   WITH MILD LVH  NORMAL RIGHT VENTRICULAR SYSTOLIC FUNCTION  MILD VALVULAR REGURGITATION (See above)  NO VALVULAR STENOSIS     Neuro/Psych neg Seizures PSYCHIATRIC DISORDERS Anxiety Depression   Dementia Chronic pain  Neuromuscular disease (lumbosacral radiculopathy, neurogenic bladder)    GI/Hepatic negative GI ROS, Neg liver ROS,,,  Endo/Other  negative endocrine ROS    Renal/GU Renal disease     Musculoskeletal  (+) Arthritis ,    Abdominal   Peds  Hematology  (+) Blood dyscrasia, anemia Lab Results      Component                Value               Date                      WBC                      18.7 (H)            05/12/2023                HGB                      6.8 (LL)            05/12/2023                HCT                      21.3 (L)            05/12/2023                MCV                      94.2                05/12/2023                PLT                      427 (H)             05/12/2023                  Anesthesia Other Findings Hgb 6.8 this morning. Will receive 2 units pRBCs.  Reproductive/Obstetrics                              Anesthesia Physical Anesthesia Plan  ASA: 3  Anesthesia Plan: General   Post-op  Pain Management: Tylenol PO (pre-op)*   Induction: Intravenous  PONV Risk Score and Plan: 3 and Ondansetron, Dexamethasone and Treatment may vary due to age or medical condition  Airway Management Planned: Oral ETT and Video Laryngoscope Planned  Additional Equipment:   Intra-op Plan:   Post-operative Plan: Extubation in OR  Informed Consent: I have reviewed the patients History and Physical, chart, labs and discussed the procedure including the risks, benefits and alternatives for the proposed anesthesia with the patient or authorized representative who has indicated his/her understanding and acceptance.     Dental advisory given  Plan Discussed with: CRNA and Anesthesiologist  Anesthesia Plan Comments: (Risks of general anesthesia discussed including, but not limited to, sore throat, hoarse voice, chipped/damaged teeth, injury to vocal cords, nausea and vomiting, allergic reactions, lung infection, heart attack, stroke, and death. All questions answered. )        Anesthesia Quick Evaluation

## 2023-05-11 NOTE — ED Provider Notes (Addendum)
 [facility] AT [facility] Provider Note   CSN: [id number] Arrival date & time: 05/11/23  1221     History  Chief Complaint  Patient presents with   Fall   Knee Pain    Bilateral knee pain after falling yesterday    Luke Foster is a 73 y.o. male.  HPI Patient presents for fall.  Fall occurred yesterday while ambulating with a walker.  Although caretaker was behind him and able to ease his fall, he did injure both of his knees at the time.  He did not strike his head.  EMS was called last night to his home to assist him off of the floor.  He declined transport to hospital at that time.  Today, he has had worsened bilateral knee pain.  Patient takes 10 mg oxycodone at baseline for chronic pain.  He was recently admitted for infected kidney stone.  He underwent placement of right ureteral stent which has since been removed.    Home Medications Prior to Admission medications   Medication Sig Start Date End Date Taking? Authorizing Provider  acetaminophen (TYLENOL) 500 MG tablet Take 1,000 mg by mouth every 6 (six) hours as needed for mild pain or moderate pain.    [provider]  ascorbic acid (VITAMIN C) 500 MG tablet Take 500 mg by mouth daily.    [provider]  Cholecalciferol (VITAMIN D) 50 MCG (2000 UT) CAPS Take 2,000 Units by mouth daily.    [provider]  cyanocobalamin (VITAMIN B12) 1000 MCG tablet Take 1,000 mcg by mouth in the morning and at bedtime.    [provider]  DILT-XR 120 MG 24 hr capsule Take 120 mg by mouth daily. 12/07/22   [provider]  docusate sodium (COLACE) 100 MG capsule Take 1 capsule (100 mg total) by mouth in the morning, at noon, and at bedtime. Patient taking differently: Take 300 mg by mouth at bedtime. 05/17/20   Medina-Vargas, Monina C, NP  ferrous sulfate 325 (65 FE) MG tablet Take 1 tablet (325 mg total) by mouth daily at 8 pm. 05/01/23   Regalado, Jon Billings A, MD  folic  acid (FOLVITE) 1 MG tablet Take 1 tablet (1 mg total) by mouth daily. 05/02/23   Regalado, Belkys A, MD  lidocaine (LIDODERM) 5 % Place 1 patch onto the skin daily as needed (pain). 11/19/22   [provider]  oxyCODONE (ROXICODONE) 15 MG immediate release tablet Take 1 tablet (15 mg total) by mouth every 6 (six) hours as needed for pain. Patient taking differently: Take 15 mg by mouth every 4 (four) hours as needed for pain. 05/17/20   Medina-Vargas, Monina C, NP  polyethylene glycol (MIRALAX / GLYCOLAX) 17 g packet Take 17 g by mouth 2 (two) times daily. 05/17/20   Medina-Vargas, Monina C, NP  rOPINIRole (REQUIP) 0.5 MG tablet Take 0.5 mg by mouth at bedtime as needed (restless legs). 09/22/21   [provider]  senna-docusate (SENOKOT-S) 8.6-50 MG tablet Take 1 tablet by mouth 2 (two) times daily. 05/01/23   Regalado, Belkys A, MD  solifenacin (VESICARE) 5 MG tablet Take 5 mg by mouth daily. 07/17/21   [provider]  tamsulosin (FLOMAX) 0.4 MG CAPS capsule Take 1 capsule (0.4 mg total) by mouth every evening. Patient taking differently: Take 0.4 mg by mouth at bedtime. 05/17/20   Medina-Vargas, Monina C, NP  venlafaxine XR (EFFEXOR-XR) 75 MG 24 hr capsule Take 1 capsule (75 mg total)  by mouth in the morning and at bedtime. 05/17/20   Medina-Vargas, Monina C, NP  Vibegron (GEMTESA PO) Take 50 mg by mouth at bedtime.    [provider]      Allergies    Erythromycin    Review of Systems   Review of Systems  Musculoskeletal:  Positive for arthralgias.  All other systems reviewed and are negative.   Physical Exam Updated Vital Signs BP 117/73   Pulse 97   Temp 98.4 F (36.9 C) (Oral)   Resp 19   Ht 5\' 8"  (1.727 m)   Wt 81.6 kg   SpO2 97%   BMI 27.37 kg/m  Physical Exam Vitals and nursing note reviewed.  Constitutional:      General: He is not in acute distress.    Appearance: Normal appearance. He is well-developed. He is not ill-appearing,  toxic-appearing or diaphoretic.  HENT:     Head: Normocephalic and atraumatic.     Right Ear: External ear normal.     Left Ear: External ear normal.     Nose: Nose normal.     Mouth/Throat:     Mouth: Mucous membranes are moist.  Eyes:     Extraocular Movements: Extraocular movements intact.     Conjunctiva/sclera: Conjunctivae normal.  Cardiovascular:     Rate and Rhythm: Normal rate and regular rhythm.  Pulmonary:     Effort: Pulmonary effort is normal. No respiratory distress.  Abdominal:     General: There is no distension.     Palpations: Abdomen is soft.     Tenderness: There is no abdominal tenderness.  Musculoskeletal:        General: Swelling and tenderness present.     Cervical back: Normal range of motion and neck supple.     Right lower leg: Edema present.     Left lower leg: Edema present.  Skin:    General: Skin is warm and dry.     Coloration: Skin is not jaundiced or pale.  Neurological:     General: No focal deficit present.     Mental Status: He is alert and oriented to person, place, and time.  Psychiatric:        Mood and Affect: Mood normal.        Behavior: Behavior normal.     ED Results / Procedures / Treatments   Labs (all labs ordered are listed, but only abnormal results are displayed) Labs Reviewed  COMPREHENSIVE METABOLIC PANEL WITH GFR - Abnormal; Notable for the following components:      Result Value   Sodium 134 (*)    Chloride 96 (*)    CO2 21 (*)    BUN 26 (*)    Calcium 8.6 (*)    Albumin 2.0 (*)    Anion gap 17 (*)    All other components within normal limits  CBC - Abnormal; Notable for the following components:   WBC 16.7 (*)    RBC 2.51 (*)    Hemoglobin 7.6 (*)    HCT 24.5 (*)    All other components within normal limits  URINALYSIS, ROUTINE W REFLEX MICROSCOPIC    EKG None  Radiology DG Knee 2 Views Right Result Date: 05/11/2023 CLINICAL DATA:  Fall and right lower extremity pain. EXAM: DG HIP (WITH OR WITHOUT  PELVIS) 2-3V RIGHT; RIGHT FEMUR 2 VIEWS; RIGHT KNEE - 1-2 VIEW COMPARISON:  None Available. FINDINGS: Evaluation is limited due to osteopenia and body habitus. There is a comminuted, oblique  fracture of the distal right femoral diaphysis with approximately 2 cm medial and posterior displacement of the distal fracture fragment. No dislocation. Mild arthritic changes of the knee. The soft tissues are unremarkable. IMPRESSION: Comminuted, displaced fracture of the distal right femoral diaphysis. Electronically Signed   By: Elgie Collard M.D.   On: 05/11/2023 15:36   DG Hip Unilat W or Wo Pelvis 2-3 Views Right Result Date: 05/11/2023 CLINICAL DATA:  Fall and right lower extremity pain. EXAM: DG HIP (WITH OR WITHOUT PELVIS) 2-3V RIGHT; RIGHT FEMUR 2 VIEWS; RIGHT KNEE - 1-2 VIEW COMPARISON:  None Available. FINDINGS: Evaluation is limited due to osteopenia and body habitus. There is a comminuted, oblique fracture of the distal right femoral diaphysis with approximately 2 cm medial and posterior displacement of the distal fracture fragment. No dislocation. Mild arthritic changes of the knee. The soft tissues are unremarkable. IMPRESSION: Comminuted, displaced fracture of the distal right femoral diaphysis. Electronically Signed   By: Elgie Collard M.D.   On: 05/11/2023 15:36   DG Femur Min 2 Views Right Result Date: 05/11/2023 CLINICAL DATA:  Fall and right lower extremity pain. EXAM: DG HIP (WITH OR WITHOUT PELVIS) 2-3V RIGHT; RIGHT FEMUR 2 VIEWS; RIGHT KNEE - 1-2 VIEW COMPARISON:  None Available. FINDINGS: Evaluation is limited due to osteopenia and body habitus. There is a comminuted, oblique fracture of the distal right femoral diaphysis with approximately 2 cm medial and posterior displacement of the distal fracture fragment. No dislocation. Mild arthritic changes of the knee. The soft tissues are unremarkable. IMPRESSION: Comminuted, displaced fracture of the distal right femoral diaphysis. Electronically  Signed   By: Elgie Collard M.D.   On: 05/11/2023 15:36   DG Chest 2 View Result Date: 05/11/2023 CLINICAL DATA:  fall EXAM: CHEST - 2 VIEW COMPARISON:  04/19/2023 FINDINGS: Improving linear opacities at the left base. Mild atelectasis at the right lung base. Heart size and mediastinal contours are within normal limits. Blunting of posterior costophrenic angles suggesting small effusions, new since previous. Visualized bones unremarkable. IMPRESSION: 1. Small bilateral pleural effusions. 2. Improving left basilar atelectasis. Electronically Signed   By: Corlis Leak M.D.   On: 05/11/2023 15:35   DG Knee 2 Views Left Result Date: 05/11/2023 CLINICAL DATA:  Status post fall with knee pain EXAM: LEFT KNEE - 2 VIEW COMPARISON:  None Available. FINDINGS: Diffuse osteopenia. There are no findings of fracture or dislocation. No joint effusion. Mild tricompartmental degenerative changes of the knee. Diffuse subcutaneous soft tissue reticulations. IMPRESSION: 1. No acute displaced fracture or dislocation. 2. Mild tricompartmental degenerative changes of the knee. Electronically Signed   By: Agustin Cree M.D.   On: 05/11/2023 15:34    Procedures Procedures    Medications Ordered in ED Medications  oxyCODONE-acetaminophen (PERCOCET/ROXICET) 5-325 MG per tablet 2 tablet (2 tablets Oral Given 05/11/23 1355)    ED Course/ Medical Decision Making/ A&P                                 Medical Decision Making Amount and/or Complexity of Data Reviewed Labs: ordered. Radiology: ordered.  Risk Prescription drug management. Decision regarding hospitalization.   This patient presents to the ED for concern of fall, this involves an extensive number of treatment options, and is a complaint that carries with it a high risk of complications and morbidity.  The differential diagnosis includes acute injuries   Co morbidities that complicate the patient evaluation  HTN, nephrolithiasis, anxiety, depression, restless  leg syndrome, dementia, neuropathy   Additional history obtained:  Additional history obtained from EMS, patient's caretaker External records from outside source obtained and reviewed including EMR   Lab Tests:  I Ordered, and personally interpreted labs.  The pertinent results include: Hemoglobin is baseline.  A leukocytosis is present.  Creatinine is mildly increased from baseline.   Imaging Studies ordered:  I ordered imaging studies including x-ray of chest, right hip, right femur, bilateral knees I independently visualized and interpreted imaging which showed comminuted, displaced fracture of distal right femoral diaphysis I agree with the radiologist interpretation   Consultations Obtained:  I requested consultation with the orthopedic surgery,  and discussed lab and imaging findings as well as pertinent plan - they recommend: I initially spoke with Dr. Dallas Schimke who stated that he would be happy to manage this patient at [facility].  Given that patient requests admission in [place], I reached out to Dr. Lajoyce Corners and then Dr. Charlann Boxer.  Dr. Charlann Boxer states that patient is on schedule for surgery with Dr. Jena Gauss at [facility] tomorrow.   Problem List / ED Course / Critical interventions / Medication management  Patient presenting for bilateral knee pain following a fall yesterday.  On arrival in the ED, patient is awake and alert.  He has diffuse swelling throughout his lower extremities which she states is baseline for him.  Acute swelling to either knee is difficult to appreciate.  He does have some tenderness.  Right leg appears shortened.  Imaging studies ordered.  Percocet ordered for analgesia.  Per chart review, he was recently admitted to the hospital for pyelonephritis with right sided ureteral stones.  He underwent laser fragmentation of stones and stent placement.  Plan for 1 week follow-up for stent removal.  He was discharged from hospital 10 days ago.  Imaging studies today show  displaced fracture of distal right femur.  Knee immobilizer was placed.  I spoke with orthopedic surgeon on-call, Dr. Dallas Schimke, who stated that he would be able to manage the patient any pain.  Given the patient requests admission to hospital in [place], I then reached out to on-call orthopedist in [place].  I initially spoke with Dr. Lajoyce Corners who requested that I reach out to the [facility] unassigned orthopedic doctor.  I then spoke with Dr. Charlann Boxer who stated that patient was on schedule with Dr. Jena Gauss at [facility] tomorrow.  Patient to be admitted to [facility]. I ordered medication including Percocet for analgesia Reevaluation of the patient after these medicines showed that the patient improved I have reviewed the patients home medicines and have made adjustments as needed   Social Determinants of Health:  Has dementia, requires caretaker support.  Utilizes wheelchair and ambulates with walker at baseline.         Final Clinical Impression(s) / ED Diagnoses Final diagnoses:  Fall, initial encounter  Closed fracture of distal end of right femur, unspecified fracture morphology, initial encounter [facility])    Rx / DC Orders ED Discharge Orders     None         Gloris Manchester, MD 05/11/23 1635    Gloris Manchester, MD 05/11/23 1743

## 2023-05-11 NOTE — Assessment & Plan Note (Addendum)
 Significant bilateral lower extremity swelling- x 2 months per patient's brother.  Chest x-ray with small bilateral pleural effusions.  Last bilateral lower extremity venous ultrasound for swelling was 2022 and were negative for DVT.  No echo on file.  Per charts weight appears stable. -Obtain echocardiogram -Check BNP -Obtain EKG

## 2023-05-11 NOTE — Assessment & Plan Note (Signed)
 Stable.  Resume home diltiazem, tamsulosin

## 2023-05-11 NOTE — ED Triage Notes (Signed)
 Pt had fall at home yesterday and now complains of bilateral knee pain. Pt not on blood thinners.

## 2023-05-11 NOTE — ED Notes (Addendum)
 Pain meds delayed due to inability to establish access. MD aware

## 2023-05-11 NOTE — ED Notes (Signed)
 ED TO INPATIENT HANDOFF REPORT  ED Nurse Name and Phone #:   S Name/Age/Gender Luke Foster 73 y.o. male Room/Bed: APA06/APA06  Code Status   Code Status: Prior  Home/SNF/Other Home Patient oriented to: self, place, time, and situation Is this baseline? Yes   Triage Complete: Triage complete  Chief Complaint Closed fracture of right distal femur (HCC) [S72.401A]  Triage Note Pt had fall at home yesterday and now complains of bilateral knee pain. Pt not on blood thinners.    Allergies Allergies  Allergen Reactions   Erythromycin Itching    Level of Care/Admitting Diagnosis ED Disposition     ED Disposition  Admit   Condition  --   Comment  Hospital Area: [facility] [[id number]]  Level of Care: Med-Surg [16]  May admit patient to [facility] or [facility] if equivalent level of care is available:: No  Covid Evaluation: Asymptomatic - no recent exposure (last 10 days) testing not required  Diagnosis: Closed fracture of right distal femur [facility]) [[id number]]  Admitting Physician: Cresenciano Lick  Attending Physician: Onnie Boer [phone]  Certification:: I certify this patient will need inpatient services for at least 2 midnights  Expected Medical Readiness: 05/13/2023          B Medical/Surgery History Past Medical History:  Diagnosis Date   Arthritis    Chronic back pain    Chronic pain 1999   Bilateral feet (R >L)   Dementia (HCC) 04/19/2023   Difficult or painful urination    High blood pressure    History of kidney stones    H/O   Nerve damage    Pain management    Paresthesia of foot, bilateral    Renal disorder    Sleep apnea    USES CPAP   Weakness of both legs    Past Surgical History:  Procedure Laterality Date   arm surgery     BACK SURGERY     X2   BOTOX INJECTION N/A 10/06/2021   Procedure: CYSTOSCOPY LITHOLAPAXY WITH BOTOX INJECTION;  Surgeon: Crista Elliot, MD;  Location: [facility];  Service:  Urology;  Laterality: N/A;   BOTOX INJECTION N/A 04/20/2022   Procedure: CYSTOSCOPY & BOTOX INJECTION, BLADDER FULGARATION;  Surgeon: Crista Elliot, MD;  Location: [facility];  Service: Urology;  Laterality: N/A;  45 MINS   BOTOX INJECTION N/A 12/28/2022   Procedure: CYSTOSCOPY WITH INTRA DETRUSOR BOTOX INJECTION;  Surgeon: Crista Elliot, MD;  Location: [facility];  Service: Urology;  Laterality: N/A;  45 MINS FOR CASE   BOWEL RESECTION N/A 01/15/2022   Procedure: SMALL BOWEL RESECTION;  Surgeon: Lewie Chamber, DO;  Location: [facility];  Service: General;  Laterality: N/A;   CYSTOSCOPY W/ URETERAL STENT PLACEMENT Right 11/29/2021   Procedure: CYSTOSCOPY WITH RETROGRADE PYELOGRAM/URETERAL STENT PLACEMENT;  Surgeon: Bjorn Pippin, MD;  Location: [facility];  Service: Urology;  Laterality: Right;   CYSTOSCOPY WITH STENT PLACEMENT Right 04/20/2023   Procedure: CYSTOSCOPY, RETROGRADE PYELOGRAM, RIGHT STENT INSERTION;  Surgeon: Despina Arias, MD;  Location: [facility];  Service: Urology;  Laterality: Right;   CYSTOSCOPY/URETEROSCOPY/HOLMIUM LASER/STENT PLACEMENT Right 01/05/2022   Procedure: CYSTOSCOPY RIGHT URETEROSCOPY/HOLMIUM LASER/STENT PLACEMENT basket extraction stones;  Surgeon: Crista Elliot, MD;  Location: [facility];  Service: Urology;  Laterality: Right;  1 HR FOR CASE   CYSTOSCOPY/URETEROSCOPY/HOLMIUM LASER/STENT PLACEMENT Right 04/30/2023   Procedure: CYSTOSCOPY/URETEROSCOPY/HOLMIUM LASER/STENT PLACEMENT;  Surgeon: Crista Elliot, MD;  Location: [facility];  Service: Urology;  Laterality: Right;   FACIAL COSMETIC SURGERY     HERNIA REPAIR     IR US GUIDE BX ASP/DRAIN  10/19/2017   KIDNEY STONE SURGERY     LAPAROTOMY N/A 01/15/2022   Procedure: Exploratory laparotomy;  Surgeon: Lewie Chamber, DO;  Location: [facility];  Service: General;  Laterality: N/A;   LUMBAR SPINAL CORD SIMULATOR LEAD REMOVAL N/A 03/01/2019   Procedure: LUMBAR SPINAL CORD SIMULATOR LEAD REMOVAL;  Surgeon:  Venetia Night, MD;  Location: [facility] OR[facility];  Service: Neurosurgery;  Laterality: N/A;   MASTECTOMY Left    SPINAL CORD STIMULATOR REMOVAL N/A 03/01/2019   Procedure: LUMBAR SPINAL CORD STIMULATOR REMOVAL;  Surgeon: Venetia Night, MD;  Location: [facility] OR[facility];  Service: Neurosurgery;  Laterality: N/A;   WISDOM TOOTH EXTRACTION       A IV Location/Drains/Wounds Patient Lines/Drains/Airways Status     Active Line/Drains/Airways     Name Placement date Placement time Site Days   Urethral Catheter Dr Alvester Morin Latex 18 Fr. 04/30/23  1455  Latex  11   Ureteral Drain/Stent Right ureter 6 Fr. 04/30/23  1450  Right ureter  11            Intake/Output Last 24 hours No intake or output data in the 24 hours ending 05/11/23 1724  Labs/Imaging Results for orders placed or performed during the hospital encounter of 05/11/23 (from the past 48 hours)  Comprehensive metabolic panel     Status: Abnormal   Collection Time: 05/11/23  2:06 PM  Result Value Ref Range   Sodium 134 (L) 135 - 145 mmol/L   Potassium 3.6 3.5 - 5.1 mmol/L   Chloride 96 (L) 98 - 111 mmol/L   CO2 21 (L) 22 - 32 mmol/L   Glucose, Bld 98 70 - 99 mg/dL    Comment: Glucose reference range applies only to samples taken after fasting for at least 8 hours.   BUN 26 (H) 8 - 23 mg/dL   Creatinine, Ser 1.61 0.61 - 1.24 mg/dL   Calcium 8.6 (L) 8.9 - 10.3 mg/dL   Total Protein 7.2 6.5 - 8.1 g/dL   Albumin 2.0 (L) 3.5 - 5.0 g/dL   AST 40 15 - 41 U/L   ALT 43 0 - 44 U/L   Alkaline Phosphatase 111 38 - 126 U/L   Total Bilirubin 0.6 0.0 - 1.2 mg/dL   GFR, Estimated >09 >60 mL/min    Comment: (NOTE) Calculated using the CKD-EPI Creatinine Equation (2021)    Anion gap 17 (H) 5 - 15    Comment: Performed at [facility], [address], [place], [place] [address]  CBC     Status: Abnormal   Collection Time: 05/11/23  2:06 PM  Result Value Ref Range   WBC 16.7 (H) 4.0 - 10.5 K/uL   RBC 2.51 (L) 4.22 - 5.81 MIL/uL   Hemoglobin 7.6  (L) 13.0 - 17.0 g/dL   HCT 81.1 (L) 91.4 - 78.2 %   MCV 97.6 80.0 - 100.0 fL   MCH 30.3 26.0 - 34.0 pg   MCHC 31.0 30.0 - 36.0 g/dL   RDW 95.6 21.3 - 08.6 %   Platelets 382 150 - 400 K/uL   nRBC 0.0 0.0 - 0.2 %    Comment: Performed at [facility], [address], [place], [place] [address]   DG Knee 2 Views Right Result Date: 05/11/2023 CLINICAL DATA:  Fall and right lower extremity pain. EXAM: DG HIP (WITH  OR WITHOUT PELVIS) 2-3V RIGHT; RIGHT FEMUR 2 VIEWS; RIGHT KNEE - 1-2 VIEW COMPARISON:  None Available. FINDINGS: Evaluation is limited due to osteopenia and body habitus. There is a comminuted, oblique fracture of the distal right femoral diaphysis with approximately 2 cm medial and posterior displacement of the distal fracture fragment. No dislocation. Mild arthritic changes of the knee. The soft tissues are unremarkable. IMPRESSION: Comminuted, displaced fracture of the distal right femoral diaphysis. Electronically Signed   By: Elgie Collard M.D.   On: 05/11/2023 15:36   DG Hip Unilat W or Wo Pelvis 2-3 Views Right Result Date: 05/11/2023 CLINICAL DATA:  Fall and right lower extremity pain. EXAM: DG HIP (WITH OR WITHOUT PELVIS) 2-3V RIGHT; RIGHT FEMUR 2 VIEWS; RIGHT KNEE - 1-2 VIEW COMPARISON:  None Available. FINDINGS: Evaluation is limited due to osteopenia and body habitus. There is a comminuted, oblique fracture of the distal right femoral diaphysis with approximately 2 cm medial and posterior displacement of the distal fracture fragment. No dislocation. Mild arthritic changes of the knee. The soft tissues are unremarkable. IMPRESSION: Comminuted, displaced fracture of the distal right femoral diaphysis. Electronically Signed   By: Elgie Collard M.D.   On: 05/11/2023 15:36   DG Femur Min 2 Views Right Result Date: 05/11/2023 CLINICAL DATA:  Fall and right lower extremity pain. EXAM: DG HIP (WITH OR WITHOUT PELVIS) 2-3V RIGHT; RIGHT FEMUR 2 VIEWS; RIGHT KNEE - 1-2 VIEW COMPARISON:  None  Available. FINDINGS: Evaluation is limited due to osteopenia and body habitus. There is a comminuted, oblique fracture of the distal right femoral diaphysis with approximately 2 cm medial and posterior displacement of the distal fracture fragment. No dislocation. Mild arthritic changes of the knee. The soft tissues are unremarkable. IMPRESSION: Comminuted, displaced fracture of the distal right femoral diaphysis. Electronically Signed   By: Elgie Collard M.D.   On: 05/11/2023 15:36   DG Chest 2 View Result Date: 05/11/2023 CLINICAL DATA:  fall EXAM: CHEST - 2 VIEW COMPARISON:  04/19/2023 FINDINGS: Improving linear opacities at the left base. Mild atelectasis at the right lung base. Heart size and mediastinal contours are within normal limits. Blunting of posterior costophrenic angles suggesting small effusions, new since previous. Visualized bones unremarkable. IMPRESSION: 1. Small bilateral pleural effusions. 2. Improving left basilar atelectasis. Electronically Signed   By: Corlis Leak M.D.   On: 05/11/2023 15:35   DG Knee 2 Views Left Result Date: 05/11/2023 CLINICAL DATA:  Status post fall with knee pain EXAM: LEFT KNEE - 2 VIEW COMPARISON:  None Available. FINDINGS: Diffuse osteopenia. There are no findings of fracture or dislocation. No joint effusion. Mild tricompartmental degenerative changes of the knee. Diffuse subcutaneous soft tissue reticulations. IMPRESSION: 1. No acute displaced fracture or dislocation. 2. Mild tricompartmental degenerative changes of the knee. Electronically Signed   By: Agustin Cree M.D.   On: 05/11/2023 15:34    Pending Labs Unresulted Labs (From admission, onward)     Start     Ordered   05/11/23 1233  Urinalysis, Routine w reflex microscopic -Urine, Clean Catch  [facility] ED TRAUMA PANEL [facility])  Once,   URGENT       Question:  Specimen Source  Answer:  Urine, Clean Catch   05/11/23 1233            Vitals/Pain Today's Vitals   05/11/23 1231 05/11/23 1234 05/11/23  1355  BP: 117/73    Pulse: 97    Resp: 19    Temp: 98.4 F (36.9 C)  TempSrc: Oral    SpO2: 97%    Weight:  81.6 kg   Height:  5\' 8"  (1.727 m)   PainSc:  7  10-Worst pain ever    Isolation Precautions No active isolations  Medications Medications  morphine (PF) 2 MG/ML injection 2 mg (has no administration in time range)  oxyCODONE-acetaminophen (PERCOCET) 7.5-325 MG per tablet 1 tablet (has no administration in time range)  oxyCODONE-acetaminophen (PERCOCET/ROXICET) 5-325 MG per tablet 2 tablet (2 tablets Oral Given 05/11/23 1355)    Mobility non-ambulatory     Focused Assessments    R Recommendations: See Admitting Provider Note  Report given to:   Additional Notes:

## 2023-05-12 ENCOUNTER — Inpatient Hospital Stay (HOSPITAL_COMMUNITY): Admitting: Anesthesiology

## 2023-05-12 ENCOUNTER — Inpatient Hospital Stay (HOSPITAL_COMMUNITY)

## 2023-05-12 ENCOUNTER — Encounter (HOSPITAL_COMMUNITY)
Admission: EM | Disposition: A | Discharge status: Skilled Nursing Facility | Payer: Self-pay | Source: Home / Self Care | Attending: Internal Medicine

## 2023-05-12 ENCOUNTER — Other Ambulatory Visit: Payer: Self-pay

## 2023-05-12 ENCOUNTER — Encounter (HOSPITAL_COMMUNITY): Payer: Self-pay | Admitting: Internal Medicine

## 2023-05-12 DIAGNOSIS — R600 Localized edema: Secondary | ICD-10-CM | POA: Diagnosis not present

## 2023-05-12 DIAGNOSIS — Z978 Presence of other specified devices: Secondary | ICD-10-CM | POA: Diagnosis not present

## 2023-05-12 DIAGNOSIS — I10 Essential (primary) hypertension: Secondary | ICD-10-CM | POA: Diagnosis not present

## 2023-05-12 DIAGNOSIS — F418 Other specified anxiety disorders: Secondary | ICD-10-CM

## 2023-05-12 DIAGNOSIS — S72491A Other fracture of lower end of right femur, initial encounter for closed fracture: Secondary | ICD-10-CM | POA: Diagnosis not present

## 2023-05-12 DIAGNOSIS — S72451A Displaced supracondylar fracture without intracondylar extension of lower end of right femur, initial encounter for closed fracture: Secondary | ICD-10-CM

## 2023-05-12 DIAGNOSIS — G4733 Obstructive sleep apnea (adult) (pediatric): Secondary | ICD-10-CM

## 2023-05-12 HISTORY — PX: ORIF FEMUR FRACTURE: SHX2119

## 2023-05-12 LAB — CBC
HCT: 21.3 % — ABNORMAL LOW (ref 39.0–52.0)
Hemoglobin: 6.8 g/dL — CL (ref 13.0–17.0)
MCH: 30.1 pg (ref 26.0–34.0)
MCHC: 31.9 g/dL (ref 30.0–36.0)
MCV: 94.2 fL (ref 80.0–100.0)
Platelets: 427 10*3/uL — ABNORMAL HIGH (ref 150–400)
RBC: 2.26 MIL/uL — ABNORMAL LOW (ref 4.22–5.81)
RDW: 14.4 % (ref 11.5–15.5)
WBC: 18.7 10*3/uL — ABNORMAL HIGH (ref 4.0–10.5)
nRBC: 0 % (ref 0.0–0.2)

## 2023-05-12 LAB — ECHOCARDIOGRAM COMPLETE
AR max vel: 2.29 cm2
AV Area VTI: 2.76 cm2
AV Area mean vel: 2.26 cm2
AV Mean grad: 5 mmHg
AV Peak grad: 11 mmHg
Ao pk vel: 1.66 m/s
Area-P 1/2: 5.79 cm2
Calc EF: 72.3 %
Height: 68 in
MV VTI: 4.1 cm2
S' Lateral: 2.6 cm
Single Plane A2C EF: 78.8 %
Single Plane A4C EF: 67.2 %
Weight: 2880 [oz_av]

## 2023-05-12 LAB — BASIC METABOLIC PANEL WITH GFR
Anion gap: 13 (ref 5–15)
BUN: 20 mg/dL (ref 8–23)
CO2: 22 mmol/L (ref 22–32)
Calcium: 8.8 mg/dL — ABNORMAL LOW (ref 8.9–10.3)
Chloride: 102 mmol/L (ref 98–111)
Creatinine, Ser: 0.94 mg/dL (ref 0.61–1.24)
GFR, Estimated: >60 mL/min (ref >60)
Glucose, Bld: 100 mg/dL — ABNORMAL HIGH (ref 70–99)
Potassium: 4 mmol/L (ref 3.5–5.1)
Sodium: 137 mmol/L (ref 135–145)

## 2023-05-12 LAB — SURGICAL PCR SCREEN
MRSA, PCR: NEGATIVE
Staphylococcus aureus: NEGATIVE

## 2023-05-12 LAB — PREPARE RBC (CROSSMATCH)

## 2023-05-12 SURGERY — OPEN REDUCTION INTERNAL FIXATION (ORIF) DISTAL FEMUR FRACTURE
Anesthesia: General | Laterality: Right

## 2023-05-12 MED ORDER — SODIUM CHLORIDE 0.9 % IV SOLN: INTRAVENOUS | Status: DC | PRN

## 2023-05-12 MED ORDER — CHLORHEXIDINE GLUCONATE 0.12 % MT SOLN: 15.0000 mL | Freq: Once | OROMUCOSAL | Status: AC

## 2023-05-12 MED ORDER — SODIUM CHLORIDE 0.9% IV SOLUTION
Freq: Once | INTRAVENOUS | Status: DC
Start: 2023-05-12 — End: 2023-05-14

## 2023-05-12 MED ORDER — METOPROLOL TARTRATE 12.5 MG HALF TABLET: 12.5000 mg | ORAL_TABLET | Freq: Once | ORAL | Status: DC

## 2023-05-12 MED ORDER — ACETAMINOPHEN 325 MG PO TABS
650.0000 mg | ORAL_TABLET | Freq: Four times a day (QID) | ORAL | Status: DC
  Administered 2023-05-13 – 2023-05-14 (×5): 650 mg via ORAL
  Filled 2023-05-12 (×5): qty 2

## 2023-05-12 MED ORDER — MORPHINE SULFATE (PF) 2 MG/ML IV SOLN
0.5000 mg | INTRAVENOUS | Status: DC | PRN
  Administered 2023-05-13: 1 mg via INTRAVENOUS
  Filled 2023-05-12: qty 1

## 2023-05-12 MED ORDER — PROPOFOL 10 MG/ML IV BOLUS
INTRAVENOUS | Status: DC | PRN
  Administered 2023-05-12: 150 mg via INTRAVENOUS

## 2023-05-12 MED ORDER — ROCURONIUM BROMIDE 10 MG/ML (PF) SYRINGE
PREFILLED_SYRINGE | INTRAVENOUS | Status: DC | PRN
Start: 2023-05-12 — End: 2023-05-12
  Administered 2023-05-12: 60 mg via INTRAVENOUS

## 2023-05-12 MED ORDER — HYDRALAZINE HCL 10 MG PO TABS: 10.0000 mg | ORAL_TABLET | Freq: Four times a day (QID) | ORAL | Status: DC | PRN

## 2023-05-12 MED ORDER — LIDOCAINE 2% (20 MG/ML) 5 ML SYRINGE
INTRAMUSCULAR | Status: DC | PRN
  Administered 2023-05-12: 100 mg via INTRAVENOUS

## 2023-05-12 MED ORDER — OXYCODONE HCL 5 MG PO TABS: 5.0000 mg | ORAL_TABLET | Freq: Once | ORAL | Status: DC | PRN

## 2023-05-12 MED ORDER — FENTANYL CITRATE (PF) 250 MCG/5ML IJ SOLN
INTRAMUSCULAR | Status: DC | PRN
  Administered 2023-05-12 (×4): 50 ug via INTRAVENOUS

## 2023-05-12 MED ORDER — 0.9 % SODIUM CHLORIDE (POUR BTL) OPTIME
TOPICAL | Status: DC | PRN
  Administered 2023-05-12: 1000 mL

## 2023-05-12 MED ORDER — AMISULPRIDE (ANTIEMETIC) 5 MG/2ML IV SOLN: 10.0000 mg | Freq: Once | INTRAVENOUS | Status: DC | PRN

## 2023-05-12 MED ORDER — FENTANYL CITRATE (PF) 100 MCG/2ML IJ SOLN
25.0000 ug | INTRAMUSCULAR | Status: DC | PRN
  Administered 2023-05-12 (×4): 25 ug via INTRAVENOUS

## 2023-05-12 MED ORDER — TRANEXAMIC ACID-NACL 1000-0.7 MG/100ML-% IV SOLN
1000.0000 mg | Freq: Once | INTRAVENOUS | Status: AC
  Administered 2023-05-12: 1000 mg via INTRAVENOUS
  Filled 2023-05-12: qty 100

## 2023-05-12 MED ORDER — FENTANYL CITRATE (PF) 100 MCG/2ML IJ SOLN
INTRAMUSCULAR | Status: AC
  Filled 2023-05-12: qty 2

## 2023-05-12 MED ORDER — PROPOFOL 10 MG/ML IV BOLUS
INTRAVENOUS | Status: AC
  Filled 2023-05-12: qty 20

## 2023-05-12 MED ORDER — DEXAMETHASONE SODIUM PHOSPHATE 10 MG/ML IJ SOLN
INTRAMUSCULAR | Status: DC | PRN
  Administered 2023-05-12: 8 mg via INTRAVENOUS

## 2023-05-12 MED ORDER — OXYCODONE HCL 5 MG PO TABS: 5.0000 mg | ORAL_TABLET | ORAL | Status: DC | PRN

## 2023-05-12 MED ORDER — SUGAMMADEX SODIUM 200 MG/2ML IV SOLN
INTRAVENOUS | Status: DC | PRN
Start: 2023-05-12 — End: 2023-05-12
  Administered 2023-05-12: 200 mg via INTRAVENOUS

## 2023-05-12 MED ORDER — FENTANYL CITRATE (PF) 250 MCG/5ML IJ SOLN
INTRAMUSCULAR | Status: AC
  Filled 2023-05-12: qty 5

## 2023-05-12 MED ORDER — HYDROMORPHONE HCL 1 MG/ML IJ SOLN
0.2500 mg | INTRAMUSCULAR | Status: DC | PRN
  Administered 2023-05-12 (×2): 0.5 mg via INTRAVENOUS

## 2023-05-12 MED ORDER — CEFAZOLIN SODIUM-DEXTROSE 2-3 GM-%(50ML) IV SOLR
INTRAVENOUS | Status: DC | PRN
  Administered 2023-05-12: 2 g via INTRAVENOUS

## 2023-05-12 MED ORDER — ACETAMINOPHEN 500 MG PO TABS
1000.0000 mg | ORAL_TABLET | Freq: Once | ORAL | Status: AC
  Administered 2023-05-12: 1000 mg via ORAL
  Filled 2023-05-12: qty 2

## 2023-05-12 MED ORDER — LACTATED RINGERS IV SOLN: INTRAVENOUS | Status: DC

## 2023-05-12 MED ORDER — CHLORHEXIDINE GLUCONATE 0.12 % MT SOLN
OROMUCOSAL | Status: AC
Start: 2023-05-12 — End: 2023-05-12
  Administered 2023-05-12: 15 mL via OROMUCOSAL
  Filled 2023-05-12: qty 15

## 2023-05-12 MED ORDER — SODIUM CHLORIDE 0.9% IV SOLUTION: Freq: Once | INTRAVENOUS | Status: DC

## 2023-05-12 MED ORDER — OXYCODONE HCL 5 MG PO TABS
15.0000 mg | ORAL_TABLET | ORAL | Status: DC | PRN
  Administered 2023-05-12 – 2023-05-14 (×7): 15 mg via ORAL
  Filled 2023-05-12 (×7): qty 3

## 2023-05-12 MED ORDER — CHLORHEXIDINE GLUCONATE CLOTH 2 % EX PADS
6.0000 | MEDICATED_PAD | Freq: Every day | CUTANEOUS | Status: DC
  Administered 2023-05-12 – 2023-05-14 (×3): 6 via TOPICAL

## 2023-05-12 MED ORDER — ONDANSETRON HCL 4 MG/2ML IJ SOLN
INTRAMUSCULAR | Status: DC | PRN
  Administered 2023-05-12: 4 mg via INTRAVENOUS

## 2023-05-12 MED ORDER — CEFAZOLIN SODIUM-DEXTROSE 2-4 GM/100ML-% IV SOLN
INTRAVENOUS | Status: AC
  Filled 2023-05-12: qty 100

## 2023-05-12 MED ORDER — ADULT MULTIVITAMIN W/MINERALS CH
1.0000 | ORAL_TABLET | Freq: Every day | ORAL | Status: DC
  Administered 2023-05-12 – 2023-05-14 (×3): 1 via ORAL
  Filled 2023-05-12 (×3): qty 1

## 2023-05-12 MED ORDER — VANCOMYCIN HCL 1000 MG IV SOLR
INTRAVENOUS | Status: DC | PRN
  Administered 2023-05-12: 1000 mg

## 2023-05-12 MED ORDER — ENOXAPARIN SODIUM 40 MG/0.4ML IJ SOSY
40.0000 mg | PREFILLED_SYRINGE | INTRAMUSCULAR | Status: DC
  Administered 2023-05-13 – 2023-05-14 (×2): 40 mg via SUBCUTANEOUS
  Filled 2023-05-12 (×2): qty 0.4

## 2023-05-12 MED ORDER — ONDANSETRON HCL 4 MG PO TABS: 4.0000 mg | ORAL_TABLET | Freq: Four times a day (QID) | ORAL | Status: DC | PRN

## 2023-05-12 MED ORDER — DILTIAZEM HCL ER COATED BEADS 120 MG PO CP24
120.0000 mg | ORAL_CAPSULE | Freq: Every day | ORAL | Status: DC
  Administered 2023-05-12 – 2023-05-14 (×3): 120 mg via ORAL
  Filled 2023-05-12 (×3): qty 1

## 2023-05-12 MED ORDER — METOCLOPRAMIDE HCL 5 MG PO TABS: 5.0000 mg | ORAL_TABLET | Freq: Three times a day (TID) | ORAL | Status: DC | PRN

## 2023-05-12 MED ORDER — OXYCODONE HCL 5 MG/5ML PO SOLN: 5.0000 mg | Freq: Once | ORAL | Status: DC | PRN

## 2023-05-12 MED ORDER — METOCLOPRAMIDE HCL 5 MG/ML IJ SOLN: 5.0000 mg | Freq: Three times a day (TID) | INTRAMUSCULAR | Status: DC | PRN

## 2023-05-12 MED ORDER — METHOCARBAMOL 500 MG PO TABS
500.0000 mg | ORAL_TABLET | Freq: Four times a day (QID) | ORAL | Status: DC | PRN
  Administered 2023-05-13 – 2023-05-14 (×3): 500 mg via ORAL
  Filled 2023-05-12 (×3): qty 1

## 2023-05-12 MED ORDER — HYDROMORPHONE HCL 1 MG/ML IJ SOLN
INTRAMUSCULAR | Status: AC
  Filled 2023-05-12: qty 1

## 2023-05-12 MED ORDER — ORAL CARE MOUTH RINSE: 15.0000 mL | Freq: Once | OROMUCOSAL | Status: AC

## 2023-05-12 MED ORDER — DOCUSATE SODIUM 100 MG PO CAPS
100.0000 mg | ORAL_CAPSULE | Freq: Two times a day (BID) | ORAL | Status: DC
  Administered 2023-05-12 – 2023-05-14 (×5): 100 mg via ORAL
  Filled 2023-05-12 (×5): qty 1

## 2023-05-12 MED ORDER — METHOCARBAMOL 1000 MG/10ML IJ SOLN
500.0000 mg | Freq: Four times a day (QID) | INTRAMUSCULAR | Status: DC | PRN
  Administered 2023-05-13: 500 mg via INTRAVENOUS
  Filled 2023-05-12: qty 10

## 2023-05-12 MED ORDER — ENSURE ENLIVE PO LIQD
237.0000 mL | Freq: Two times a day (BID) | ORAL | Status: DC
Start: 2023-05-13 — End: 2023-05-14
  Administered 2023-05-14: 237 mL via ORAL

## 2023-05-12 MED ORDER — VANCOMYCIN HCL 1000 MG IV SOLR
INTRAVENOUS | Status: AC
  Filled 2023-05-12: qty 20

## 2023-05-12 MED ORDER — CEFAZOLIN SODIUM-DEXTROSE 2-4 GM/100ML-% IV SOLN
2.0000 g | Freq: Three times a day (TID) | INTRAVENOUS | Status: AC
  Administered 2023-05-12 – 2023-05-13 (×3): 2 g via INTRAVENOUS
  Filled 2023-05-12 (×3): qty 100

## 2023-05-12 MED ORDER — VENLAFAXINE HCL ER 75 MG PO CP24
75.0000 mg | ORAL_CAPSULE | Freq: Two times a day (BID) | ORAL | Status: DC
  Administered 2023-05-12 – 2023-05-14 (×5): 75 mg via ORAL
  Filled 2023-05-12 (×5): qty 1

## 2023-05-12 MED ORDER — ONDANSETRON HCL 4 MG/2ML IJ SOLN
4.0000 mg | Freq: Four times a day (QID) | INTRAMUSCULAR | Status: DC | PRN
Start: 2023-05-12 — End: 2023-05-14

## 2023-05-12 SURGICAL SUPPLY — 61 items
BAG COUNTER SPONGE SURGICOUNT (BAG) ×1 IMPLANT
BIT DRILL 4.3X300MM (BIT) IMPLANT
BIT DRILL NCB-PP FEMUR MIS 3 (DRILL) IMPLANT
BIT DRILL QC 3.3X195 (BIT) IMPLANT
BLADE CLIPPER SURG (BLADE) IMPLANT
BNDG COHESIVE 6X5 TAN ST LF (GAUZE/BANDAGES/DRESSINGS) ×1 IMPLANT
BNDG ELASTIC 4INX 5YD STR LF (GAUZE/BANDAGES/DRESSINGS) IMPLANT
BNDG ELASTIC 6INX 5YD STR LF (GAUZE/BANDAGES/DRESSINGS) IMPLANT
BNDG ELASTIC 6X10 VLCR STRL LF (GAUZE/BANDAGES/DRESSINGS) ×1 IMPLANT
BRUSH SCRUB EZ PLAIN DRY (MISCELLANEOUS) ×2 IMPLANT
CANISTER SUCT 3000ML PPV (MISCELLANEOUS) ×1 IMPLANT
CAP LOCK NCB (Cap) IMPLANT
CHLORAPREP W/TINT 26 (MISCELLANEOUS) ×1 IMPLANT
COVER SURGICAL LIGHT HANDLE (MISCELLANEOUS) ×1 IMPLANT
DRAPE C-ARM 42X72 X-RAY (DRAPES) ×1 IMPLANT
DRAPE C-ARMOR (DRAPES) ×1 IMPLANT
DRAPE HALF SHEET 40X57 (DRAPES) ×2 IMPLANT
DRAPE SURG 17X23 STRL (DRAPES) ×1 IMPLANT
DRAPE SURG ORHT 6 SPLT 77X108 (DRAPES) ×2 IMPLANT
DRAPE U-SHAPE 47X51 STRL (DRAPES) ×1 IMPLANT
DRESSING MEPILEX FLEX 4X4 (GAUZE/BANDAGES/DRESSINGS) IMPLANT
DRILL NCB-PP FEMUR MIS 3 (DRILL) ×1 IMPLANT
DRSG ADAPTIC 3X8 NADH LF (GAUZE/BANDAGES/DRESSINGS) IMPLANT
DRSG MEPILEX FLEX 4X4 (GAUZE/BANDAGES/DRESSINGS) IMPLANT
DRSG MEPILEX POST OP 4X12 (GAUZE/BANDAGES/DRESSINGS) IMPLANT
DRSG MEPILEX POST OP 4X8 (GAUZE/BANDAGES/DRESSINGS) IMPLANT
ELECT REM PT RETURN 9FT ADLT (ELECTROSURGICAL) ×1 IMPLANT
ELECTRODE REM PT RTRN 9FT ADLT (ELECTROSURGICAL) ×1 IMPLANT
GAUZE PAD ABD 8X10 STRL (GAUZE/BANDAGES/DRESSINGS) ×3 IMPLANT
GAUZE SPONGE 4X4 12PLY STRL (GAUZE/BANDAGES/DRESSINGS) ×1 IMPLANT
GLOVE BIO SURGEON STRL SZ 6.5 (GLOVE) ×3 IMPLANT
GLOVE BIO SURGEON STRL SZ7.5 (GLOVE) ×4 IMPLANT
GLOVE BIOGEL PI IND STRL 6.5 (GLOVE) ×1 IMPLANT
GLOVE BIOGEL PI IND STRL 7.5 (GLOVE) ×1 IMPLANT
GOWN STRL REUS W/ TWL LRG LVL3 (GOWN DISPOSABLE) ×3 IMPLANT
K-WIRE FXSTD 280X2XNS SS (WIRE) ×1 IMPLANT
KIT BASIN OR (CUSTOM PROCEDURE TRAY) ×1 IMPLANT
KIT TURNOVER KIT B (KITS) ×1 IMPLANT
KWIRE FXSTD 280X2XNS SS (WIRE) IMPLANT
NS IRRIG 1000ML POUR BTL (IV SOLUTION) ×1 IMPLANT
PACK TOTAL JOINT (CUSTOM PROCEDURE TRAY) ×1 IMPLANT
PAD ARMBOARD POSITIONER FOAM (MISCELLANEOUS) ×1 IMPLANT
PAD CAST 4YDX4 CTTN HI CHSV (CAST SUPPLIES) ×1 IMPLANT
PAD CAST CTTN 4X4 STRL (SOFTGOODS) IMPLANT
PADDING CAST COTTON 6X4 STRL (CAST SUPPLIES) ×1 IMPLANT
PLATE FEM DIST NCB PP 278MM (Plate) IMPLANT
SCREW 5.0 80MM (Screw) IMPLANT
SCREW NCB 4.0X36MM (Screw) IMPLANT
SCREW NCB 5.0X38 (Screw) IMPLANT
SCREW NCB 5.0X85MM (Screw) IMPLANT
SPONGE T-LAP 18X18 ~~LOC~~+RFID (SPONGE) IMPLANT
STAPLER VISISTAT 35W (STAPLE) ×1 IMPLANT
SUCTION TUBE FRAZIER 10FR DISP (SUCTIONS) ×1 IMPLANT
SUT ETHILON 3 0 PS 1 (SUTURE) ×2 IMPLANT
SUT MON AB 2-0 CT1 36 (SUTURE) IMPLANT
SUT VIC AB 0 CT1 27XBRD ANBCTR (SUTURE) IMPLANT
SUT VIC AB 1 CT1 27XBRD ANBCTR (SUTURE) IMPLANT
SUT VIC AB 2-0 CT1 TAPERPNT 27 (SUTURE) ×2 IMPLANT
TOWEL GREEN STERILE (TOWEL DISPOSABLE) ×2 IMPLANT
TRAY FOLEY MTR SLVR 16FR STAT (SET/KITS/TRAYS/PACK) IMPLANT
WATER STERILE IRR 1000ML POUR (IV SOLUTION) ×2 IMPLANT

## 2023-05-12 NOTE — Progress Notes (Signed)
 Initial Nutrition Assessment  DOCUMENTATION CODES:   Non-severe (moderate) malnutrition in context of acute illness/injury  INTERVENTION:  Ensure Enlive po BID, each supplement provides 350 kcal and 20 grams of protein.  Multivitamins with minerals.   Ordered weight verification.   NUTRITION DIAGNOSIS:   Moderate Malnutrition related to acute illness as evidenced by energy intake < 75% for > 7 days, mild muscle depletion.  GOAL:   Patient will meet greater than or equal to 90% of their needs  MONITOR:   PO intake, Supplement acceptance  REASON FOR ASSESSMENT:   Consult Assessment of nutrition requirement/status  ASSESSMENT:   PMH: obstructive uropathy, neurogenic bladder with chronic indwelling Foley catheter, hypertension, restless leg syndrome, dementia. Presents with complaints of fall and knee pain. Recent hospitalization 3/10-3/22 for pyelonephritis, nephrolithiasis with stent placement 3/11. 4/2- Open reduction internal fixation R distal femur fracture  Pt found eating mac and cheese and chx breast post op. Pt's brother present in room. Pt reports only eating soft diet for past 2 weeks since last hospitalization for cystoscopy, stent placement. Intake has been down due to that in recent weeks but has been drinking Ensure ONS at home. Appetite good after surgery and would like to continue Ensure during stay.   Medications reviewed and include: Flomax, senna  Labs reviewed.   Intake/Output Summary (Last 24 hours) at 05/12/2023 1549 Last data filed at 05/12/2023 1507 Gross per 24 hour  Intake 1265 ml  Output 1425 ml  Net -160 ml    Weights reviewed. -9.1 kg (10%) wt loss in 6 months Admit weight 81.6 kg.    NUTRITION - FOCUSED PHYSICAL EXAM:  Flowsheet Row Most Recent Value  Orbital Region No depletion  Upper Arm Region Mild depletion  Thoracic and Lumbar Region No depletion  Buccal Region Mild depletion  Temple Region Mild depletion  Clavicle Bone Region  Moderate depletion  Clavicle and Acromion Bone Region Mild depletion  Scapular Bone Region Mild depletion  Dorsal Hand No depletion  Patellar Region Unable to assess  Anterior Thigh Region Unable to assess  Posterior Calf Region Unable to assess  Edema (RD Assessment) Moderate  Hair Reviewed  Eyes Reviewed  Mouth Reviewed  Skin Reviewed  Nails Reviewed       Diet Order:   Diet Order             Diet Heart Room service appropriate? Yes; Fluid consistency: Thin  Diet effective now                   EDUCATION NEEDS:   Education needs have been addressed  Skin:  Skin Assessment: Reviewed RN Assessment  Last BM:  3/31  Height:   Ht Readings from Last 1 Encounters:  05/11/23 5\' 8"  (1.727 m)    Weight:   Wt Readings from Last 1 Encounters:  05/11/23 81.6 kg    Ideal Body Weight:  59.5 kg  BMI:  Body mass index is 27.37 kg/m.  Estimated Nutritional Needs:   Kcal:  2000-2350  Protein:  100-120  Fluid:  >2L/day  Kathrynn Speed, MPH, RD, LDN Clinical Dietitian Contact information can be found at [facility].

## 2023-05-12 NOTE — Transfer of Care (Signed)
 Immediate Anesthesia Transfer of Care Note  Patient: Luke Foster  Procedure(s) Performed: OPEN REDUCTION INTERNAL FIXATION (ORIF) DISTAL FEMUR FRACTURE (Right)  Patient Location: PACU  Anesthesia Type:General  Level of Consciousness: awake and alert   Airway & Oxygen Therapy: Patient Spontanous Breathing and Patient connected to face mask oxygen  Post-op Assessment: Report given to RN and Post -op Vital signs reviewed and stable  Post vital signs: Reviewed and stable  Last Vitals:  Vitals Value Taken Time  BP 146/83 05/12/23 1034  Temp 37 C 05/12/23 1034  Pulse 100 05/12/23 1037  Resp 18 05/12/23 1037  SpO2 97 % 05/12/23 1037  Vitals shown include unfiled device data.  Last Pain:  Vitals:   05/12/23 0845  TempSrc: Oral  PainSc:          Complications: No notable events documented.

## 2023-05-12 NOTE — Progress Notes (Signed)
 Surgeon made aware of hemoglobin 6.8, ordered two units of PRBCs to be transfused when available. Anesthesia made aware.

## 2023-05-12 NOTE — Op Note (Signed)
 Orthopaedic Surgery Operative Note (CSN: [id number] ) Date of Surgery: 05/12/2023  Admit Date: 05/11/2023   Diagnoses: Pre-Op Diagnoses: Right supracondylar distal femur fracture  Post-Op Diagnosis: Same  Procedures: CPT 27511-Open reduction internal fixation of right distal femur fracture  Surgeons : Primary: Roby Lofts, MD  Assistant: Thyra Breed, PA-C  Location: OR 3   Anesthesia: General   Antibiotics: Ancef 2g preop with 1 gm vancomycin powder placed topically   Tourniquet time: None    Estimated Blood Loss: 50 mL  Complications:* No complications entered in OR log *   Specimens:* No specimens in log *   Implants: Implant Name Type Inv. Item Serial No. Manufacturer Lot No. LRB No. Used Action  CAP LOCK NCB - [id number] Cap CAP LOCK NCB  [employer](ORTH,TRAU,BIO,SG)  Right 7 Implanted  PLATE FEM DIST NCB PP - [id number] Plate PLATE FEM DIST NCB PP  [employer](ORTH,TRAU,BIO,SG)  Right 1 Implanted  SCREW 5.0 - [id number] Screw SCREW 5.0  [employer](ORTH,TRAU,BIO,SG)  Right 1 Implanted  SCREW NCB 5.0X85MM - [id number] Screw SCREW NCB 5.0X85MM  [employer](ORTH,TRAU,BIO,SG)  Right 4 Implanted  SCREW NCB 5.0X38 - [id number] Screw SCREW NCB 5.0X38  [employer](ORTH,TRAU,BIO,SG)  Right 2 Implanted  SCREW NCB 4.0X36MM - [id number] Screw SCREW NCB 4.0X36MM  [employer](ORTH,TRAU,BIO,SG)  Right 1 Implanted     Indications for Surgery: 73 year old male who sustained a ground-level fall with a right supracondylar distal femur fracture.  Due to the unstable nature of his injury I recommend proceeding with open reduction internal fixation.  Risks and benefits were discussed with the patient and his brother.  Risks include but not limited to bleeding, infection, malunion, nonunion, hardware failure, hardware irritation, nerve and blood vessel injury, DVT, knee stiffness, even the possibility anesthetic complications.  They agreed to proceed with  surgery and consent was obtained.  Operative Findings: Open duction internal fixation right supracondylar distal femur fracture using [employer] NCB distal femoral locking plate.  Procedure: The patient was identified in the preoperative holding area. Consent was confirmed with the patient and their family and all questions were answered. The operative extremity was marked after confirmation with the patient. he was then brought back to the operating room by our anesthesia colleagues.  He was placed under general anesthetic and carefully transferred over to radiolucent flattop table.  A bump was placed under his operative hip.  The right lower extremity was then prepped and draped in usual sterile fashion.  A timeout was performed to verify the patient, the procedure, and the extremity.  Preoperative antibiotics were dosed.  The hip and knee were flexed over a triangle fluoroscopic imaging showed the unstable nature of his injury.  A lateral approach to the distal femur was carried down through skin and subcutaneous tissue.  I incised through the IT band and expose the lateral condyle of the femur.  I then mobilized the vastus lateralis and used a 12 hole [employer] NCB distal femoral locking plate attached to a targeting arm and slid this submuscularly along the lateral cortex of the femur.  I held it provisionally distally with a 2.0 mm K wire.  I then used the targeting arm to place a percutaneous 3.3 mm drill bit in the proximal screw hole to align the proximal portion of the plate.  I then returned to the distal segment and proceeded to place 5.0 millimeter screws to bring the plate flush to bone.  I then percutaneously placed 5.0 millimeter screws  into the shaft of the femur to align the coronal portion of the fracture.  The 3.3 mm drill bit proximally was removed and a 4.0 millimeter screw was placed.  Locking caps were placed 5.0 millimeter screws in the shaft of the femur.  I returned to the  distal segment and placed 3 more 5.0 millimeter screws.  Locking caps were placed on all of the distal screws.  Final fluoroscopic imaging was obtained.  The incisions were copiously irrigated.  A gram of vancomycin powder was placed into the incision.  A layered closure of #1 Vicryl, 2-0 Monocryl and 3-0 nylon was used to close the skin.  Sterile dressings were applied.  The patient was then awoke from anesthesia and taken to the PACU in stable condition.  Post Op Plan/Instructions: Patient will be weightbearing as tolerated to the right lower extremity.  He will receive postoperative Ancef.  He will receive Lovenox for DVT prophylaxis while inpatient and discharged on an oral DOAC.  Will have him mobilize with physical and Occupational Therapy.  He will likely need a skilled nursing facility.  I was present and performed the entire surgery.  Thyra Breed, PA-C did assist me throughout the case. An assistant was necessary given the difficulty in approach, maintenance of reduction and ability to instrument the fracture.   Truitt Merle, MD [facility]

## 2023-05-12 NOTE — Consult Note (Signed)
 Orthopaedic Trauma Service (OTS) Consult   Patient ID: Luke Foster MRN: [id number] DOB/AGE: Oct 27, 1950 73 y.o.  Reason for Consult:Right distal femur fracture Referring Physician: Dr. Dorene Grebe, MD [name]  HPI: [name] is an 73 y.o. male seen in consultation at the request of Dr. August Saucer for evaluation of right distal femur fracture.  Patient had a ground-level fall yesterday has sustained above injury.  Due to the OR availability I was asked to take over care for the patient.  His brother knows Dr. August Saucer personally.  The patient is limited mobility and just recently he was hospitalized for kidney stones and gallstones.  He just had his home health started.  He ambulates with the use of a walker.  He lives alone but has home health aide that comes in.  Patient denies any other injuries.  His hemoglobin is 6.8 this morning.  Past Medical History:  Diagnosis Date   Arthritis    Chronic back pain    Chronic pain 1999   Bilateral feet (R >L)   Dementia (HCC) 04/19/2023   Difficult or painful urination    High blood pressure    History of kidney stones    H/O   Nerve damage    Pain management    Paresthesia of foot, bilateral    Renal disorder    Sleep apnea    USES CPAP   Weakness of both legs     Past Surgical History:  Procedure Laterality Date   arm surgery     BACK SURGERY     X2   BOTOX INJECTION N/A 10/06/2021   Procedure: CYSTOSCOPY LITHOLAPAXY WITH BOTOX INJECTION;  Surgeon: Crista Elliot, MD;  Location: [facility];  Service: Urology;  Laterality: N/A;   BOTOX INJECTION N/A 04/20/2022   Procedure: CYSTOSCOPY & BOTOX INJECTION, BLADDER FULGARATION;  Surgeon: Crista Elliot, MD;  Location: [facility];  Service: Urology;  Laterality: N/A;  45 MINS   BOTOX INJECTION N/A 12/28/2022   Procedure: CYSTOSCOPY WITH INTRA DETRUSOR BOTOX INJECTION;  Surgeon: Crista Elliot, MD;  Location: [facility];  Service: Urology;  Laterality: N/A;  45 MINS FOR CASE   BOWEL RESECTION N/A  01/15/2022   Procedure: SMALL BOWEL RESECTION;  Surgeon: Lewie Chamber, DO;  Location: [facility];  Service: General;  Laterality: N/A;   CYSTOSCOPY W/ URETERAL STENT PLACEMENT Right 11/29/2021   Procedure: CYSTOSCOPY WITH RETROGRADE PYELOGRAM/URETERAL STENT PLACEMENT;  Surgeon: Bjorn Pippin, MD;  Location: [facility];  Service: Urology;  Laterality: Right;   CYSTOSCOPY WITH STENT PLACEMENT Right 04/20/2023   Procedure: CYSTOSCOPY, RETROGRADE PYELOGRAM, RIGHT STENT INSERTION;  Surgeon: Despina Arias, MD;  Location: [facility];  Service: Urology;  Laterality: Right;   CYSTOSCOPY/URETEROSCOPY/HOLMIUM LASER/STENT PLACEMENT Right 01/05/2022   Procedure: CYSTOSCOPY RIGHT URETEROSCOPY/HOLMIUM LASER/STENT PLACEMENT basket extraction stones;  Surgeon: Crista Elliot, MD;  Location: [facility];  Service: Urology;  Laterality: Right;  1 HR FOR CASE   CYSTOSCOPY/URETEROSCOPY/HOLMIUM LASER/STENT PLACEMENT Right 04/30/2023   Procedure: CYSTOSCOPY/URETEROSCOPY/HOLMIUM LASER/STENT PLACEMENT;  Surgeon: Crista Elliot, MD;  Location: [facility];  Service: Urology;  Laterality: Right;   FACIAL COSMETIC SURGERY     HERNIA REPAIR     IR US GUIDE BX ASP/DRAIN  10/19/2017   KIDNEY STONE SURGERY     LAPAROTOMY N/A 01/15/2022   Procedure: Exploratory laparotomy;  Surgeon: Lewie Chamber, DO;  Location: [facility];  Service: General;  Laterality: N/A;   LUMBAR SPINAL CORD SIMULATOR LEAD  REMOVAL N/A 03/01/2019   Procedure: LUMBAR SPINAL CORD SIMULATOR LEAD REMOVAL;  Surgeon: Venetia Night, MD;  Location: [facility];  Service: Neurosurgery;  Laterality: N/A;   MASTECTOMY Left    SPINAL CORD STIMULATOR REMOVAL N/A 03/01/2019   Procedure: LUMBAR SPINAL CORD STIMULATOR REMOVAL;  Surgeon: Venetia Night, MD;  Location: [facility];  Service: Neurosurgery;  Laterality: N/A;   WISDOM TOOTH EXTRACTION      Family History  Problem Relation Age of Onset   Heart disease Father        Living, 56   Breast cancer Mother         Living, 37   Hypercholesterolemia Mother    Healthy Brother    Healthy Sister     Social History:  reports that he quit smoking about 31 years ago. His smoking use included cigarettes. He started smoking about 42 years ago. He has a 20 pack-year smoking history. He quit smokeless tobacco use about 31 years ago. He reports current alcohol use. He reports that he does not use drugs.  Allergies:  Allergies  Allergen Reactions   Erythromycin Itching    Medications:  No current facility-administered medications on file prior to encounter.   Current Outpatient Medications on File Prior to Encounter  Medication Sig Dispense Refill   acetaminophen (TYLENOL) 500 MG tablet Take 1,000 mg by mouth every 6 (six) hours as needed for mild pain or moderate pain.     ascorbic acid (VITAMIN C) 500 MG tablet Take 500 mg by mouth daily.     Cholecalciferol (VITAMIN D) 50 MCG (2000 UT) CAPS Take 2,000 Units by mouth daily.     cyanocobalamin (VITAMIN B12) 1000 MCG tablet Take 1,000 mcg by mouth in the morning and at bedtime.     DILT-XR 120 MG 24 hr capsule Take 120 mg by mouth daily.     docusate sodium (COLACE) 100 MG capsule Take 1 capsule (100 mg total) by mouth in the morning, at noon, and at bedtime. (Patient taking differently: Take 300 mg by mouth at bedtime.) 90 capsule 0   ferrous sulfate 325 (65 FE) MG tablet Take 1 tablet (325 mg total) by mouth daily at 8 pm. 30 tablet 3   folic acid (FOLVITE) 1 MG tablet Take 1 tablet (1 mg total) by mouth daily. 30 tablet 0   lidocaine (LIDODERM) 5 % Place 1 patch onto the skin daily as needed (pain).     oxyCODONE (ROXICODONE) 15 MG immediate release tablet Take 1 tablet (15 mg total) by mouth every 6 (six) hours as needed for pain. (Patient taking differently: Take 15 mg by mouth every 4 (four) hours as needed for pain.) 30 tablet 0   polyethylene glycol (MIRALAX / GLYCOLAX) 17 g packet Take 17 g by mouth 2 (two) times daily. (Patient taking  differently: Take 17 g by mouth daily as needed for mild constipation.) 14 each 3   solifenacin (VESICARE) 5 MG tablet Take 5 mg by mouth daily.     sulfamethoxazole-trimethoprim (BACTRIM DS) 800-160 MG tablet Take 1 tablet by mouth at bedtime.     tamsulosin (FLOMAX) 0.4 MG CAPS capsule Take 1 capsule (0.4 mg total) by mouth every evening. (Patient taking differently: Take 0.4 mg by mouth at bedtime.) 30 capsule 0   venlafaxine XR (EFFEXOR-XR) 75 MG 24 hr capsule Take 1 capsule (75 mg total) by mouth in the morning and at bedtime. 60 capsule 0   Vibegron (GEMTESA PO) Take 50 mg by mouth at  bedtime.       ROS: Constitutional: No fever or chills Vision: No changes in vision ENT: No difficulty swallowing CV: No chest pain Pulm: No SOB or wheezing GI: No nausea or vomiting GU: No urgency or inability to hold urine Skin: No poor wound healing Neurologic: No numbness or tingling Psychiatric: No depression or anxiety Heme: No bruising Allergic: No reaction to medications or food   Exam: Blood pressure 108/78, pulse (!) 118, temperature 99.1 F (37.3 C), temperature source Oral, resp. rate 14, height 5\' 8"  (1.727 m), weight 81.6 kg, SpO2 96%. General: No acute distress Orientation: Awake alert and oriented x 3 Mood and Affect: Cooperative and pleasant Gait: Unable to assess due to his fracture Coordination and balance: Within normal limits  Right lower extremity: Pitting edema to the lower extremity with soft and compressible compartments to his calf.  No skin lesions or previous scars.  No open wounds.  There is significant ecchymosis.  Deformity through the distal femur.  He is able to wiggle his toes and endorses a light sensation to his dorsum and plantar aspect of his foot.  Left lower extremity: Skin without lesions. No tenderness to palpation. Full painless ROM, full strength in each muscle groups without evidence of instability.   Medical Decision Making: Data: Imaging: X-rays  of his right femur show extra-articular distal femur fracture with significant displacement.  No obvious intra-articular extension.  Labs:  Results for orders placed or performed during the hospital encounter of 05/11/23 (from the past 24 hours)  Comprehensive metabolic panel     Status: Abnormal   Collection Time: 05/11/23  2:06 PM  Result Value Ref Range   Sodium 134 (L) 135 - 145 mmol/L   Potassium 3.6 3.5 - 5.1 mmol/L   Chloride 96 (L) 98 - 111 mmol/L   CO2 21 (L) 22 - 32 mmol/L   Glucose, Bld 98 70 - 99 mg/dL   BUN 26 (H) 8 - 23 mg/dL   Creatinine, Ser 1.61 0.61 - 1.24 mg/dL   Calcium 8.6 (L) 8.9 - 10.3 mg/dL   Total Protein 7.2 6.5 - 8.1 g/dL   Albumin 2.0 (L) 3.5 - 5.0 g/dL   AST 40 15 - 41 U/L   ALT 43 0 - 44 U/L   Alkaline Phosphatase 111 38 - 126 U/L   Total Bilirubin 0.6 0.0 - 1.2 mg/dL   GFR, Estimated >09 >60 mL/min   Anion gap 17 (H) 5 - 15  CBC     Status: Abnormal   Collection Time: 05/11/23  2:06 PM  Result Value Ref Range   WBC 16.7 (H) 4.0 - 10.5 K/uL   RBC 2.51 (L) 4.22 - 5.81 MIL/uL   Hemoglobin 7.6 (L) 13.0 - 17.0 g/dL   HCT 45.4 (L) 09.8 - 11.9 %   MCV 97.6 80.0 - 100.0 fL   MCH 30.3 26.0 - 34.0 pg   MCHC 31.0 30.0 - 36.0 g/dL   RDW 14.7 82.9 - 56.2 %   Platelets 382 150 - 400 K/uL   nRBC 0.0 0.0 - 0.2 %  Brain natriuretic peptide     Status: None   Collection Time: 05/11/23  2:06 PM  Result Value Ref Range   B Natriuretic Peptide 91.0 0.0 - 100.0 pg/mL  Surgical pcr screen     Status: None   Collection Time: 05/12/23 12:40 AM   Specimen: Nasal Mucosa; Nasal Swab  Result Value Ref Range   MRSA, PCR NEGATIVE NEGATIVE   Staphylococcus  aureus NEGATIVE NEGATIVE  Type and screen [facility]     Status: None (Preliminary result)   Collection Time: 05/12/23  5:51 AM  Result Value Ref Range   ABO/RH(D) A POS    Antibody Screen NEG    Sample Expiration 05/15/2023,2359    Unit Number [id number]    Blood Component Type RED CELLS,LR     Unit division 00    Status of Unit ISSUED    Transfusion Status OK TO TRANSFUSE    Crossmatch Result Compatible    Unit Number [id number]    Blood Component Type RED CELLS,LR    Unit division 00    Status of Unit ISSUED    Transfusion Status OK TO TRANSFUSE    Crossmatch Result      Compatible Performed at [facility], [address] [place], [place] [address]    Unit Number [id number]    Blood Component Type RED CELLS,LR    Unit division 00    Status of Unit ALLOCATED    Transfusion Status OK TO TRANSFUSE    Crossmatch Result Compatible    Unit Number [id number]    Blood Component Type RED CELLS,LR    Unit division 00    Status of Unit ALLOCATED    Transfusion Status OK TO TRANSFUSE    Crossmatch Result Compatible   CBC     Status: Abnormal   Collection Time: 05/12/23  6:06 AM  Result Value Ref Range   WBC 18.7 (H) 4.0 - 10.5 K/uL   RBC 2.26 (L) 4.22 - 5.81 MIL/uL   Hemoglobin 6.8 (LL) 13.0 - 17.0 g/dL   HCT 06.3 (L) 01.6 - 01.0 %   MCV 94.2 80.0 - 100.0 fL   MCH 30.1 26.0 - 34.0 pg   MCHC 31.9 30.0 - 36.0 g/dL   RDW 93.2 35.5 - 73.2 %   Platelets 427 (H) 150 - 400 K/uL   nRBC 0.0 0.0 - 0.2 %  Basic metabolic panel     Status: Abnormal   Collection Time: 05/12/23  6:06 AM  Result Value Ref Range   Sodium 137 135 - 145 mmol/L   Potassium 4.0 3.5 - 5.1 mmol/L   Chloride 102 98 - 111 mmol/L   CO2 22 22 - 32 mmol/L   Glucose, Bld 100 (H) 70 - 99 mg/dL   BUN 20 8 - 23 mg/dL   Creatinine, Ser 2.02 0.61 - 1.24 mg/dL   Calcium 8.8 (L) 8.9 - 10.3 mg/dL   GFR, Estimated >54 >27 mL/min   Anion gap 13 5 - 15  Prepare RBC (crossmatch)     Status: None   Collection Time: 05/12/23  8:00 AM  Result Value Ref Range   Order Confirmation      ORDER PROCESSED BY BLOOD BANK Performed at [facility], [address], [place], [place] [address]   Prepare RBC (crossmatch)     Status: None   Collection Time: 05/12/23  8:30 AM  Result Value Ref Range    Order Confirmation      BB SAMPLE OR UNITS ALREADY AVAILABLE Performed at [facility], [address], [place], [place] [address]      Imaging or Labs ordered: None  Medical history and chart was reviewed and case discussed with medical provider.  Assessment/Plan: 73 year old male with extra-articular distal femur fracture  Patient will require formal open reduction internal fixation.  Will plan to proceed this morning.  Risks and benefits  were discussed with the patient and his brother.  Risks include but not limited to bleeding, infection, malunion, nonunion, hardware failure, hardware rotation, nerve or blood vessel injury, knee stiffness, DVT, even the possibility anesthetic complications.  They agreed to proceed with surgery and consent was obtained.  Roby Lofts, MD [facility] [phone] (office) [email or web address]

## 2023-05-12 NOTE — Anesthesia Procedure Notes (Signed)
 Procedure Name: Intubation Date/Time: 05/12/2023 9:38 AM  Performed by: Little Ishikawa, CRNAPre-anesthesia Checklist: Patient identified, Emergency Drugs available, Suction available, Timeout performed and Patient being monitored Patient Re-evaluated:Patient Re-evaluated prior to induction Oxygen Delivery Method: Circle system utilized Preoxygenation: Pre-oxygenation with 100% oxygen Induction Type: IV induction Ventilation: Mask ventilation without difficulty Laryngoscope Size: Mac and 4 Grade View: Grade II Tube type: Oral Tube size: 7.5 mm Number of attempts: 1 Airway Equipment and Method: Stylet Placement Confirmation: ETT inserted through vocal cords under direct vision, positive ETCO2, CO2 detector and breath sounds checked- equal and bilateral Secured at: 23 cm Tube secured with: Tape Dental Injury: Teeth and Oropharynx as per pre-operative assessment

## 2023-05-12 NOTE — Evaluation (Signed)
 Physical Therapy Evaluation Patient Details Name: Luke Foster MRN: [id number] DOB: 1950-07-23 Today's Date: 05/12/2023  History of Present Illness  Pt is a 73 y.o. M who presents after a fall with right distal femur fx s/p ORIF. Significant PMH: dementia, paresthesia of bilateral feet, back surgery.  Clinical Impression  PTA, pt lives alone, uses a RW for mobility and has hired caregivers for ADL assist. Pt presents with decreased functional mobility secondary to decreased ROM, weakness, pain (RLE and L knee), increased edema, peripheral neuropathy (baseline), and impaired sitting balance. Pt requiring moderate assist for bed mobility. Able to perform low level ROM and therapeutic exercises edge of bed. Deferred transfer out of bed this session due to decreased pain control. May benefit from [facility] to transfer out of bed and +2 assist. Discussed with pt brother via phone. Based on deficits and increased assist required, recommend  continued inpatient follow up therapy, <3 hours/day.         If plan is discharge home, recommend the following: Two people to help with walking and/or transfers;Two people to help with bathing/dressing/bathroom   Can travel by private vehicle   No    Equipment Recommendations Hoyer lift;Wheelchair (measurements PT);Wheelchair cushion (measurements PT)  Recommendations for Other Services       Functional Status Assessment Patient has had a recent decline in their functional status and demonstrates the ability to make significant improvements in function in a reasonable and predictable amount of time.     Precautions / Restrictions Precautions Precautions: Fall Restrictions Weight Bearing Restrictions Per Provider Order: No      Mobility  Bed Mobility Overal bed mobility: Needs Assistance Bed Mobility: Supine to Sit, Sit to Supine     Supine to sit: Mod assist Sit to supine: Mod assist   General bed mobility comments: Dependent to move BLE's, cues  for use of rail, pt able to manage trunk    Transfers                   General transfer comment: unable    Ambulation/Gait                  Stairs            Wheelchair Mobility     Tilt Bed    Modified Rankin (Stroke Patients Only)       Balance Overall balance assessment: Needs assistance, History of Falls Sitting-balance support: Feet supported, Single extremity supported Sitting balance-Leahy Scale: Poor Sitting balance - Comments: reliant on BUE support                                     Pertinent Vitals/Pain Pain Assessment Pain Assessment: Faces Faces Pain Scale: Hurts whole lot Pain Location: RLE, L knee Pain Descriptors / Indicators: Grimacing, Operative site guarding, Sharp Pain Intervention(s): Limited activity within patient's tolerance, Monitored during session, Premedicated before session, Repositioned, Ice applied    Home Living Family/patient expects to be discharged to:: Private residence Living Arrangements: Alone Available Help at Discharge: Personal care attendant Type of Home: House Home Access: Ramped entrance       Home Layout: One level Home Equipment: Shower seat - built [employer] fundraiser (2 wheels);Cane - single point;Hand held shower head;BSC/3in1;Hospital bed Additional Comments: Aide 7 days/wk, varying hours about 8 hours/day. Miniature [employer]    Prior Function Prior Level of Function : Needs assist  Mobility Comments: household ambulator using RW ADLs Comments: patient states independent with basic ADL, aide assists with cooking/cleaning, IADLs     Extremity/Trunk Assessment   Upper Extremity Assessment Upper Extremity Assessment: Defer to OT evaluation    Lower Extremity Assessment Lower Extremity Assessment: RLE deficits/detail;LLE deficits/detail RLE Deficits / Details: Able to perform limited quad set and LAQ, ankle dorsiflexion WFL RLE  Sensation: history of peripheral neuropathy LLE Deficits / Details: Increased edema, tendency to rest increased inversion but AROM to neutral LLE Sensation: history of peripheral neuropathy       Communication   Communication Communication: No apparent difficulties Factors Affecting Communication: Hearing impaired    Cognition Arousal: Alert Behavior During Therapy: WFL for tasks assessed/performed   PT - Cognitive impairments: History of cognitive impairments                       PT - Cognition Comments: Hx of dementia, pt pleasant and following cues Following commands: Intact       Cueing Cueing Techniques: Verbal cues, Visual cues     General Comments      Exercises General Exercises - Lower Extremity Ankle Circles/Pumps: Both, 5 reps, Seated Long Arc Quad: Both, 5 reps, Seated   Assessment/Plan    PT Assessment Patient needs continued PT services  PT Problem List Decreased strength;Decreased activity tolerance;Decreased balance;Decreased range of motion;Decreased mobility;Decreased coordination;Decreased safety awareness       PT Treatment Interventions DME instruction;Therapeutic exercise;Gait training;Functional mobility training;Therapeutic activities;Patient/family education    PT Goals (Current goals can be found in the Care Plan section)  Acute Rehab PT Goals Patient Stated Goal: pt brother agreeable to rehab PT Goal Formulation: With patient/family Time For Goal Achievement: 05/26/23 Potential to Achieve Goals: Fair    Frequency Min 3X/week     Co-evaluation               AM-PAC PT "6 Clicks" Mobility  Outcome Measure Help needed turning from your back to your side while in a flat bed without using bedrails?: A Lot Help needed moving from lying on your back to sitting on the side of a flat bed without using bedrails?: A Lot Help needed moving to and from a bed to a chair (including a wheelchair)?: Total Help needed standing up  from a chair using your arms (e.g., wheelchair or bedside chair)?: Total Help needed to walk in hospital room?: Total Help needed climbing 3-5 steps with a railing? : Total 6 Click Score: 8    End of Session   Activity Tolerance: Patient limited by pain Patient left: in bed;with call bell/phone within reach;with bed alarm set Nurse Communication: Mobility status PT Visit Diagnosis: Other abnormalities of gait and mobility (R26.89);Pain Pain - Right/Left: Right Pain - part of body: Leg    Time: 1610-9604 PT Time Calculation (min) (ACUTE ONLY): 39 min   Charges:   PT Evaluation $PT Eval Moderate Complexity: 1 Mod PT Treatments $Therapeutic Activity: 8-22 mins PT General Charges $$ ACUTE PT VISIT: 1 Visit         Lillia Pauls, PT, DPT Acute Rehabilitation Services Office [phone]   Norval Morton 05/12/2023, 4:58 PM

## 2023-05-12 NOTE — Progress Notes (Signed)
 PROGRESS NOTE    Luke Foster  MVH:[id number] DOB: 04/19/1950 DOA: 05/11/2023 PCP: Elfredia Nevins, MD   Brief Narrative:  Luke Foster is a 73 y.o. male with medical history significant for obstructive uropathy, neurogenic bladder with chronic indwelling Foley catheter, hypertension, restless leg syndrome, dementia.  Patient presents to our system after a mechanical fall with subsequent right knee pain.  Imaging confirms comminuted and displaced fracture of the distal right femoral diaphysis, discussed case with orthopedic surgery who recommended transfer to main campus.   Of note patient was recently hospitalized at our facility 3/10 - 3/22 for pyelonephritis, nephrolithiasis with stent placement 3/11.  Cultures grew Enterobacter.  Stay was complicated by small bowel obstruction managed conservatively.  Had worsening transaminitis and abdominal pain ultrasound showed mild gallbladder thickening, HIDA was ordered to rule out cholecystitis.  Assessment & Plan:   Principal Problem:   Closed fracture of right distal femur [facility]) Active Problems:   Bilateral lower extremity edema   Benign essential HTN   Restless leg syndrome   Dementia (HCC)   Chronic indwelling Foley catheter   Closed fracture of right distal femur (HCC) Acute on chronic ambulatory dysfunction -Status post mechanical fall, uses a walker at baseline  -Orthopedic surgery following, status post open reduction internal fixation of the right distal femur fracture on 05/12/2023 -Pain management somewhat difficult given chronic narcotic use, continue to titrate as appropriate  SIRS criteria without sepsis -Patient meets SIRS criteria in the setting of leukocytosis and tachycardia likely reactive in the setting of pain and fracture as above -No signs or symptoms of infection at this time   Acute anemia, unspecified  -Patient is without bleeding, anemia is noted prior to procedure -Likely partially hemodilutional given  fluids preoperatively in setting of n.p.o. status -Baseline hemoglobin previously around 13, at previous hospitalization last month hemoglobin stable around 7.5 -1 unit PRBC to transfuse today given hemoglobin of 6.8, continue to follow -Prior anemia panel remarkable for iron deficiency complicated by folate deficiency (B12 elevated) -Continue to advance diet as tolerated, discussed increasing iron intake as well as multivitamin  Subacute to chronic bilateral lower extremity edema -Patient reports new lower extremity edema bilaterally over the past 2 months -No prior echo in our system, patient reports echo having been done remotely and was "normal" per the best of his knowledge -Repeat echo pending -Bilateral lower extremity DVT study in 2022 negative for DVT, unclear if edema is as acute as family indicates given this testing nearly 3 years ago. -BNP within normal limits   Dementia (HCC) Stable.  Lives at home.  Brother appears to be primary caretaker -Resume Effexor.   Restless leg syndrome Resume ropinirole.   Benign essential HTN Stable.  Resume home diltiazem, tamsulosin  DVT prophylaxis: SCDs Start: 05/11/23 2040   Code Status:   Code Status: Full Code  Family Communication: Brother at bedside  Status is: Inpatient  Dispo: The patient is from: Home              Anticipated d/c is to: To be determined              Anticipated d/c date is: 48 to 72 hours              Patient currently not medically stable for discharge  Consultants:  Orthopedic surgery  Procedures:  ORIF femur fracture, right 4/ 2/25  Antimicrobials:  Preoperatively  Subjective: No acute issues or events overnight, tolerated procedure quite well denies nausea vomiting diarrhea  constipation headache fevers chills or chest pain.  Lower extremity pain is ongoing but markedly improved from previous  Objective: Vitals:   05/11/23 1234 05/11/23 2055 05/12/23 0434 05/12/23 0747  BP:  119/67 129/71  128/73  Pulse:  92 89 (!) 119  Resp:  17 17 18   Temp:  98.7 F (37.1 C) 99.1 F (37.3 C)   TempSrc:   Oral   SpO2:  100% 100% 97%  Weight: 81.6 kg     Height: 5\' 8"  (1.727 m)       Intake/Output Summary (Last 24 hours) at 05/12/2023 0804 Last data filed at 05/12/2023 0600 Gross per 24 hour  Intake 200 ml  Output 1125 ml  Net -925 ml   Filed Weights   05/11/23 1234  Weight: 81.6 kg    Examination:  General:  Pleasantly resting in bed, No acute distress. HEENT:  Normocephalic atraumatic.  Sclerae nonicteric, noninjected.  Extraocular movements intact bilaterally. Neck:  Without mass or deformity.  Trachea is midline. Lungs:  Clear to auscultate bilaterally without rhonchi, wheeze, or rales. Heart:  Regular rate and rhythm.  Without murmurs, rubs, or gallops. Abdomen:  Soft, nontender, nondistended.  Without guarding or rebound. Extremities: Decreased range of motion right lower extremity  Data Reviewed: I have personally reviewed following labs and imaging studies  CBC: Recent Labs  Lab 05/11/23 1406 05/12/23 0606  WBC 16.7* 18.7*  HGB 7.6* 6.8*  HCT 24.5* 21.3*  MCV 97.6 94.2  PLT 382 427*   Basic Metabolic Panel: Recent Labs  Lab 05/11/23 1406 05/12/23 0606  NA 134* 137  K 3.6 4.0  CL 96* 102  CO2 21* 22  GLUCOSE 98 100*  BUN 26* 20  CREATININE 1.22 0.94  CALCIUM 8.6* 8.8*   GFR: Estimated Creatinine Clearance: 69.7 mL/min (by C-G formula based on SCr of 0.94 mg/dL). Liver Function Tests: Recent Labs  Lab 05/11/23 1406  AST 40  ALT 43  ALKPHOS 111  BILITOT 0.6  PROT 7.2  ALBUMIN 2.0*   Recent Results (from the past 240 hours)  Surgical pcr screen     Status: None   Collection Time: 05/12/23 12:40 AM   Specimen: Nasal Mucosa; Nasal Swab  Result Value Ref Range Status   MRSA, PCR NEGATIVE NEGATIVE Final   Staphylococcus aureus NEGATIVE NEGATIVE Final    Comment: (NOTE) The Xpert SA Assay (FDA approved for NASAL specimens in patients  23 years of age and older), is one component of a comprehensive surveillance program. It is not intended to diagnose infection nor to guide or monitor treatment. Performed at [facility], [address], [place], [place] [address]      Radiology Studies: DG Knee 2 Views Right Result Date: 05/11/2023 CLINICAL DATA:  Fall and right lower extremity pain. EXAM: DG HIP (WITH OR WITHOUT PELVIS) 2-3V RIGHT; RIGHT FEMUR 2 VIEWS; RIGHT KNEE - 1-2 VIEW COMPARISON:  None Available. FINDINGS: Evaluation is limited due to osteopenia and body habitus. There is a comminuted, oblique fracture of the distal right femoral diaphysis with approximately 2 cm medial and posterior displacement of the distal fracture fragment. No dislocation. Mild arthritic changes of the knee. The soft tissues are unremarkable. IMPRESSION: Comminuted, displaced fracture of the distal right femoral diaphysis. Electronically Signed   By: Elgie Collard M.D.   On: 05/11/2023 15:36   DG Hip Unilat W or Wo Pelvis 2-3 Views Right Result Date: 05/11/2023 CLINICAL DATA:  Fall and right lower extremity pain. EXAM: DG HIP (  WITH OR WITHOUT PELVIS) 2-3V RIGHT; RIGHT FEMUR 2 VIEWS; RIGHT KNEE - 1-2 VIEW COMPARISON:  None Available. FINDINGS: Evaluation is limited due to osteopenia and body habitus. There is a comminuted, oblique fracture of the distal right femoral diaphysis with approximately 2 cm medial and posterior displacement of the distal fracture fragment. No dislocation. Mild arthritic changes of the knee. The soft tissues are unremarkable. IMPRESSION: Comminuted, displaced fracture of the distal right femoral diaphysis. Electronically Signed   By: Elgie Collard M.D.   On: 05/11/2023 15:36   DG Femur Min 2 Views Right Result Date: 05/11/2023 CLINICAL DATA:  Fall and right lower extremity pain. EXAM: DG HIP (WITH OR WITHOUT PELVIS) 2-3V RIGHT; RIGHT FEMUR 2 VIEWS; RIGHT KNEE - 1-2 VIEW COMPARISON:  None Available. FINDINGS: Evaluation  is limited due to osteopenia and body habitus. There is a comminuted, oblique fracture of the distal right femoral diaphysis with approximately 2 cm medial and posterior displacement of the distal fracture fragment. No dislocation. Mild arthritic changes of the knee. The soft tissues are unremarkable. IMPRESSION: Comminuted, displaced fracture of the distal right femoral diaphysis. Electronically Signed   By: Elgie Collard M.D.   On: 05/11/2023 15:36   DG Chest 2 View Result Date: 05/11/2023 CLINICAL DATA:  fall EXAM: CHEST - 2 VIEW COMPARISON:  04/19/2023 FINDINGS: Improving linear opacities at the left base. Mild atelectasis at the right lung base. Heart size and mediastinal contours are within normal limits. Blunting of posterior costophrenic angles suggesting small effusions, new since previous. Visualized bones unremarkable. IMPRESSION: 1. Small bilateral pleural effusions. 2. Improving left basilar atelectasis. Electronically Signed   By: Corlis Leak M.D.   On: 05/11/2023 15:35   DG Knee 2 Views Left Result Date: 05/11/2023 CLINICAL DATA:  Status post fall with knee pain EXAM: LEFT KNEE - 2 VIEW COMPARISON:  None Available. FINDINGS: Diffuse osteopenia. There are no findings of fracture or dislocation. No joint effusion. Mild tricompartmental degenerative changes of the knee. Diffuse subcutaneous soft tissue reticulations. IMPRESSION: 1. No acute displaced fracture or dislocation. 2. Mild tricompartmental degenerative changes of the knee. Electronically Signed   By: Agustin Cree M.D.   On: 05/11/2023 15:34   Scheduled Meds:  sodium chloride   Intravenous Once   [MAR Hold] diltiazem  120 mg Oral Daily   lidocaine  1 patch Transdermal Once   [MAR Hold] mirabegron ER  25 mg Oral QHS   [MAR Hold] tamsulosin  0.4 mg Oral QHS   [MAR Hold] venlafaxine XR  75 mg Oral BID   Continuous Infusions:  lactated ringers       LOS: 1 day   Time spent:  Azucena Fallen, DO [facility]  If 7PM-7AM, please contact night-coverage [email or web address]  05/12/2023, 8:04 AM

## 2023-05-12 NOTE — Progress Notes (Signed)
  Echocardiogram 2D Echocardiogram has been performed.  Ocie Doyne RDCS 05/12/2023, 1:25 PM

## 2023-05-12 NOTE — Interval H&P Note (Signed)
 History and Physical Interval Note:  05/12/2023 8:28 AM  Luke Foster  has presented today for surgery, with the diagnosis of right distal femur fracture.  The various methods of treatment have been discussed with the patient and family. After consideration of risks, benefits and other options for treatment, the patient has consented to  Procedure(s): OPEN REDUCTION INTERNAL FIXATION (ORIF) DISTAL FEMUR FRACTURE (Right) as a surgical intervention.  The patient's history has been reviewed, patient examined, no change in status, stable for surgery.  I have reviewed the patient's chart and labs.  Questions were answered to the patient's satisfaction.     Caryn Bee P Shayne Diguglielmo

## 2023-05-12 NOTE — H&P (View-Only) (Signed)
 [facility]uma Service (OTS) Consult   Patient ID: Luke Foster MRN: [id number] DOB/AGE: Oct 27, 1950 73 y.o.  Reason for Consult:Right distal femur fracture Referring Physician: Dr. Dorene Grebe, MD [name]  HPI: Luke Foster is an 73 y.o. male seen in consultation at the request of Dr. August Saucer for evaluation of right distal femur fracture.  Patient had a ground-level fall yesterday has sustained above injury.  Due to the OR availability I was asked to take over care for the patient.  His brother knows Dr. August Saucer personally.  The patient is limited mobility and just recently he was hospitalized for kidney stones and gallstones.  He just had his home health started.  He ambulates with the use of a walker.  He lives alone but has home health aide that comes in.  Patient denies any other injuries.  His hemoglobin is 6.8 this morning.  Past Medical History:  Diagnosis Date   Arthritis    Chronic back pain    Chronic pain 1999   Bilateral feet (R >L)   Dementia (HCC) 04/19/2023   Difficult or painful urination    High blood pressure    History of kidney stones    H/O   Nerve damage    Pain management    Paresthesia of foot, bilateral    Renal disorder    Sleep apnea    USES CPAP   Weakness of both legs     Past Surgical History:  Procedure Laterality Date   arm surgery     BACK SURGERY     X2   BOTOX INJECTION N/A 10/06/2021   Procedure: CYSTOSCOPY LITHOLAPAXY WITH BOTOX INJECTION;  Surgeon: Crista Elliot, MD;  Location: [facility];  Service: Urology;  Laterality: N/A;   BOTOX INJECTION N/A 04/20/2022   Procedure: CYSTOSCOPY & BOTOX INJECTION, BLADDER FULGARATION;  Surgeon: Crista Elliot, MD;  Location: [facility];  Service: Urology;  Laterality: N/A;  45 MINS   BOTOX INJECTION N/A 12/28/2022   Procedure: CYSTOSCOPY WITH INTRA DETRUSOR BOTOX INJECTION;  Surgeon: Crista Elliot, MD;  Location: [facility];  Service: Urology;  Laterality: N/A;  45 MINS FOR CASE   BOWEL RESECTION N/A  01/15/2022   Procedure: SMALL BOWEL RESECTION;  Surgeon: Lewie Chamber, DO;  Location: [facility];  Service: General;  Laterality: N/A;   CYSTOSCOPY W/ URETERAL STENT PLACEMENT Right 11/29/2021   Procedure: CYSTOSCOPY WITH RETROGRADE PYELOGRAM/URETERAL STENT PLACEMENT;  Surgeon: Bjorn Pippin, MD;  Location: [facility];  Service: Urology;  Laterality: Right;   CYSTOSCOPY WITH STENT PLACEMENT Right 04/20/2023   Procedure: CYSTOSCOPY, RETROGRADE PYELOGRAM, RIGHT STENT INSERTION;  Surgeon: Despina Arias, MD;  Location: [facility];  Service: Urology;  Laterality: Right;   CYSTOSCOPY/URETEROSCOPY/HOLMIUM LASER/STENT PLACEMENT Right 01/05/2022   Procedure: CYSTOSCOPY RIGHT URETEROSCOPY/HOLMIUM LASER/STENT PLACEMENT basket extraction stones;  Surgeon: Crista Elliot, MD;  Location: [facility];  Service: Urology;  Laterality: Right;  1 HR FOR CASE   CYSTOSCOPY/URETEROSCOPY/HOLMIUM LASER/STENT PLACEMENT Right 04/30/2023   Procedure: CYSTOSCOPY/URETEROSCOPY/HOLMIUM LASER/STENT PLACEMENT;  Surgeon: Crista Elliot, MD;  Location: [facility];  Service: Urology;  Laterality: Right;   FACIAL COSMETIC SURGERY     HERNIA REPAIR     IR US GUIDE BX ASP/DRAIN  10/19/2017   KIDNEY STONE SURGERY     LAPAROTOMY N/A 01/15/2022   Procedure: Exploratory laparotomy;  Surgeon: Lewie Chamber, DO;  Location: [facility];  Service: General;  Laterality: N/A;   LUMBAR SPINAL CORD SIMULATOR LEAD  REMOVAL N/A 03/01/2019   Procedure: LUMBAR SPINAL CORD SIMULATOR LEAD REMOVAL;  Surgeon: Venetia Night, MD;  Location: [facility];  Service: Neurosurgery;  Laterality: N/A;   MASTECTOMY Left    SPINAL CORD STIMULATOR REMOVAL N/A 03/01/2019   Procedure: LUMBAR SPINAL CORD STIMULATOR REMOVAL;  Surgeon: Venetia Night, MD;  Location: [facility];  Service: Neurosurgery;  Laterality: N/A;   WISDOM TOOTH EXTRACTION      Family History  Problem Relation Age of Onset   Heart disease Father        Living, 56   Breast cancer Mother         Living, 37   Hypercholesterolemia Mother    Healthy Brother    Healthy Sister     Social History:  reports that he quit smoking about 31 years ago. His smoking use included cigarettes. He started smoking about 42 years ago. He has a 20 pack-year smoking history. He quit smokeless tobacco use about 31 years ago. He reports current alcohol use. He reports that he does not use drugs.  Allergies:  Allergies  Allergen Reactions   Erythromycin Itching    Medications:  No current facility-administered medications on file prior to encounter.   Current Outpatient Medications on File Prior to Encounter  Medication Sig Dispense Refill   acetaminophen (TYLENOL) 500 MG tablet Take 1,000 mg by mouth every 6 (six) hours as needed for mild pain or moderate pain.     ascorbic acid (VITAMIN C) 500 MG tablet Take 500 mg by mouth daily.     Cholecalciferol (VITAMIN D) 50 MCG (2000 UT) CAPS Take 2,000 Units by mouth daily.     cyanocobalamin (VITAMIN B12) 1000 MCG tablet Take 1,000 mcg by mouth in the morning and at bedtime.     DILT-XR 120 MG 24 hr capsule Take 120 mg by mouth daily.     docusate sodium (COLACE) 100 MG capsule Take 1 capsule (100 mg total) by mouth in the morning, at noon, and at bedtime. (Patient taking differently: Take 300 mg by mouth at bedtime.) 90 capsule 0   ferrous sulfate 325 (65 FE) MG tablet Take 1 tablet (325 mg total) by mouth daily at 8 pm. 30 tablet 3   folic acid (FOLVITE) 1 MG tablet Take 1 tablet (1 mg total) by mouth daily. 30 tablet 0   lidocaine (LIDODERM) 5 % Place 1 patch onto the skin daily as needed (pain).     oxyCODONE (ROXICODONE) 15 MG immediate release tablet Take 1 tablet (15 mg total) by mouth every 6 (six) hours as needed for pain. (Patient taking differently: Take 15 mg by mouth every 4 (four) hours as needed for pain.) 30 tablet 0   polyethylene glycol (MIRALAX / GLYCOLAX) 17 g packet Take 17 g by mouth 2 (two) times daily. (Patient taking  differently: Take 17 g by mouth daily as needed for mild constipation.) 14 each 3   solifenacin (VESICARE) 5 MG tablet Take 5 mg by mouth daily.     sulfamethoxazole-trimethoprim (BACTRIM DS) 800-160 MG tablet Take 1 tablet by mouth at bedtime.     tamsulosin (FLOMAX) 0.4 MG CAPS capsule Take 1 capsule (0.4 mg total) by mouth every evening. (Patient taking differently: Take 0.4 mg by mouth at bedtime.) 30 capsule 0   venlafaxine XR (EFFEXOR-XR) 75 MG 24 hr capsule Take 1 capsule (75 mg total) by mouth in the morning and at bedtime. 60 capsule 0   Vibegron (GEMTESA PO) Take 50 mg by mouth at  bedtime.       ROS: Constitutional: No fever or chills Vision: No changes in vision ENT: No difficulty swallowing CV: No chest pain Pulm: No SOB or wheezing GI: No nausea or vomiting GU: No urgency or inability to hold urine Skin: No poor wound healing Neurologic: No numbness or tingling Psychiatric: No depression or anxiety Heme: No bruising Allergic: No reaction to medications or food   Exam: Blood pressure 108/78, pulse (!) 118, temperature 99.1 F (37.3 C), temperature source Oral, resp. rate 14, height 5\' 8"  (1.727 m), weight 81.6 kg, SpO2 96%. General: No acute distress Orientation: Awake alert and oriented x 3 Mood and Affect: Cooperative and pleasant Gait: Unable to assess due to his fracture Coordination and balance: Within normal limits  Right lower extremity: Pitting edema to the lower extremity with soft and compressible compartments to his calf.  No skin lesions or previous scars.  No open wounds.  There is significant ecchymosis.  Deformity through the distal femur.  He is able to wiggle his toes and endorses a light sensation to his dorsum and plantar aspect of his foot.  Left lower extremity: Skin without lesions. No tenderness to palpation. Full painless ROM, full strength in each muscle groups without evidence of instability.   Medical Decision Making: Data: Imaging: X-rays  of his right femur show extra-articular distal femur fracture with significant displacement.  No obvious intra-articular extension.  Labs:  Results for orders placed or performed during the hospital encounter of 05/11/23 (from the past 24 hours)  Comprehensive metabolic panel     Status: Abnormal   Collection Time: 05/11/23  2:06 PM  Result Value Ref Range   Sodium 134 (L) 135 - 145 mmol/L   Potassium 3.6 3.5 - 5.1 mmol/L   Chloride 96 (L) 98 - 111 mmol/L   CO2 21 (L) 22 - 32 mmol/L   Glucose, Bld 98 70 - 99 mg/dL   BUN 26 (H) 8 - 23 mg/dL   Creatinine, Ser 1.61 0.61 - 1.24 mg/dL   Calcium 8.6 (L) 8.9 - 10.3 mg/dL   Total Protein 7.2 6.5 - 8.1 g/dL   Albumin 2.0 (L) 3.5 - 5.0 g/dL   AST 40 15 - 41 U/L   ALT 43 0 - 44 U/L   Alkaline Phosphatase 111 38 - 126 U/L   Total Bilirubin 0.6 0.0 - 1.2 mg/dL   GFR, Estimated >09 >60 mL/min   Anion gap 17 (H) 5 - 15  CBC     Status: Abnormal   Collection Time: 05/11/23  2:06 PM  Result Value Ref Range   WBC 16.7 (H) 4.0 - 10.5 K/uL   RBC 2.51 (L) 4.22 - 5.81 MIL/uL   Hemoglobin 7.6 (L) 13.0 - 17.0 g/dL   HCT 45.4 (L) 09.8 - 11.9 %   MCV 97.6 80.0 - 100.0 fL   MCH 30.3 26.0 - 34.0 pg   MCHC 31.0 30.0 - 36.0 g/dL   RDW 14.7 82.9 - 56.2 %   Platelets 382 150 - 400 K/uL   nRBC 0.0 0.0 - 0.2 %  Brain natriuretic peptide     Status: None   Collection Time: 05/11/23  2:06 PM  Result Value Ref Range   B Natriuretic Peptide 91.0 0.0 - 100.0 pg/mL  Surgical pcr screen     Status: None   Collection Time: 05/12/23 12:40 AM   Specimen: Nasal Mucosa; Nasal Swab  Result Value Ref Range   MRSA, PCR NEGATIVE NEGATIVE   Staphylococcus  aureus NEGATIVE NEGATIVE  Type and screen [facility]     Status: None (Preliminary result)   Collection Time: 05/12/23  5:51 AM  Result Value Ref Range   ABO/RH(D) A POS    Antibody Screen NEG    Sample Expiration 05/15/2023,2359    Unit Number [id number]    Blood Component Type RED CELLS,LR     Unit division 00    Status of Unit ISSUED    Transfusion Status OK TO TRANSFUSE    Crossmatch Result Compatible    Unit Number [id number]    Blood Component Type RED CELLS,LR    Unit division 00    Status of Unit ISSUED    Transfusion Status OK TO TRANSFUSE    Crossmatch Result      Compatible Performed at [facility], [address] [place], [place] [address]    Unit Number [id number]    Blood Component Type RED CELLS,LR    Unit division 00    Status of Unit ALLOCATED    Transfusion Status OK TO TRANSFUSE    Crossmatch Result Compatible    Unit Number [id number]    Blood Component Type RED CELLS,LR    Unit division 00    Status of Unit ALLOCATED    Transfusion Status OK TO TRANSFUSE    Crossmatch Result Compatible   CBC     Status: Abnormal   Collection Time: 05/12/23  6:06 AM  Result Value Ref Range   WBC 18.7 (H) 4.0 - 10.5 K/uL   RBC 2.26 (L) 4.22 - 5.81 MIL/uL   Hemoglobin 6.8 (LL) 13.0 - 17.0 g/dL   HCT 06.3 (L) 01.6 - 01.0 %   MCV 94.2 80.0 - 100.0 fL   MCH 30.1 26.0 - 34.0 pg   MCHC 31.9 30.0 - 36.0 g/dL   RDW 93.2 35.5 - 73.2 %   Platelets 427 (H) 150 - 400 K/uL   nRBC 0.0 0.0 - 0.2 %  Basic metabolic panel     Status: Abnormal   Collection Time: 05/12/23  6:06 AM  Result Value Ref Range   Sodium 137 135 - 145 mmol/L   Potassium 4.0 3.5 - 5.1 mmol/L   Chloride 102 98 - 111 mmol/L   CO2 22 22 - 32 mmol/L   Glucose, Bld 100 (H) 70 - 99 mg/dL   BUN 20 8 - 23 mg/dL   Creatinine, Ser 2.02 0.61 - 1.24 mg/dL   Calcium 8.8 (L) 8.9 - 10.3 mg/dL   GFR, Estimated >54 >27 mL/min   Anion gap 13 5 - 15  Prepare RBC (crossmatch)     Status: None   Collection Time: 05/12/23  8:00 AM  Result Value Ref Range   Order Confirmation      ORDER PROCESSED BY BLOOD BANK Performed at [facility], [address], [place], [place] [address]   Prepare RBC (crossmatch)     Status: None   Collection Time: 05/12/23  8:30 AM  Result Value Ref Range    Order Confirmation      BB SAMPLE OR UNITS ALREADY AVAILABLE Performed at [facility], [address], [place], [place] [address]      Imaging or Labs ordered: None  Medical history and chart was reviewed and case discussed with medical provider.  Assessment/Plan: 73 year old male with extra-articular distal femur fracture  Patient will require formal open reduction internal fixation.  Will plan to proceed this morning.  Risks and benefits  were discussed with the patient and his brother.  Risks include but not limited to bleeding, infection, malunion, nonunion, hardware failure, hardware rotation, nerve or blood vessel injury, knee stiffness, DVT, even the possibility anesthetic complications.  They agreed to proceed with surgery and consent was obtained.  Roby Lofts, MD [facility] [phone] (office) [email or web address]

## 2023-05-12 NOTE — Anesthesia Postprocedure Evaluation (Signed)
 Anesthesia Post Note  Patient: Luke Foster  Procedure(s) Performed: OPEN REDUCTION INTERNAL FIXATION (ORIF) DISTAL FEMUR FRACTURE (Right)     Patient location during evaluation: PACU Anesthesia Type: General Level of consciousness: awake Pain management: pain level controlled Vital Signs Assessment: post-procedure vital signs reviewed and stable Respiratory status: spontaneous breathing, nonlabored ventilation and respiratory function stable Cardiovascular status: blood pressure returned to baseline and stable Postop Assessment: no apparent nausea or vomiting Anesthetic complications: no   No notable events documented.  Last Vitals:  Vitals:   05/12/23 1148 05/12/23 1149  BP: 127/81 127/81  Pulse: 85 85  Resp: 14 18  Temp: 36.9 C 36.9 C  SpO2: 99% 99%    Last Pain:  Vitals:   05/12/23 1149  TempSrc: Oral  PainSc: 4                  [name]

## 2023-05-13 ENCOUNTER — Encounter (HOSPITAL_COMMUNITY): Payer: Self-pay | Admitting: Student

## 2023-05-13 DIAGNOSIS — G2581 Restless legs syndrome: Secondary | ICD-10-CM | POA: Diagnosis not present

## 2023-05-13 DIAGNOSIS — G894 Chronic pain syndrome: Secondary | ICD-10-CM | POA: Diagnosis not present

## 2023-05-13 DIAGNOSIS — S72491A Other fracture of lower end of right femur, initial encounter for closed fracture: Secondary | ICD-10-CM | POA: Diagnosis not present

## 2023-05-13 DIAGNOSIS — I10 Essential (primary) hypertension: Secondary | ICD-10-CM | POA: Diagnosis not present

## 2023-05-13 DIAGNOSIS — Z978 Presence of other specified devices: Secondary | ICD-10-CM | POA: Diagnosis not present

## 2023-05-13 DIAGNOSIS — E440 Moderate protein-calorie malnutrition: Secondary | ICD-10-CM | POA: Insufficient documentation

## 2023-05-13 DIAGNOSIS — S72302A Unspecified fracture of shaft of left femur, initial encounter for closed fracture: Secondary | ICD-10-CM | POA: Diagnosis not present

## 2023-05-13 DIAGNOSIS — R5381 Other malaise: Secondary | ICD-10-CM | POA: Diagnosis not present

## 2023-05-13 DIAGNOSIS — R600 Localized edema: Secondary | ICD-10-CM | POA: Diagnosis not present

## 2023-05-13 LAB — CBC
HCT: 26 % — ABNORMAL LOW (ref 39.0–52.0)
Hemoglobin: 8.7 g/dL — ABNORMAL LOW (ref 13.0–17.0)
MCH: 29.7 pg (ref 26.0–34.0)
MCHC: 33.5 g/dL (ref 30.0–36.0)
MCV: 88.7 fL (ref 80.0–100.0)
Platelets: 376 10*3/uL (ref 150–400)
RBC: 2.93 MIL/uL — ABNORMAL LOW (ref 4.22–5.81)
RDW: 16.7 % — ABNORMAL HIGH (ref 11.5–15.5)
WBC: 14.2 10*3/uL — ABNORMAL HIGH (ref 4.0–10.5)
nRBC: 0.3 % — ABNORMAL HIGH (ref 0.0–0.2)

## 2023-05-13 LAB — BASIC METABOLIC PANEL WITH GFR
Anion gap: 14 (ref 5–15)
BUN: 25 mg/dL — ABNORMAL HIGH (ref 8–23)
CO2: 20 mmol/L — ABNORMAL LOW (ref 22–32)
Calcium: 8.7 mg/dL — ABNORMAL LOW (ref 8.9–10.3)
Chloride: 101 mmol/L (ref 98–111)
Creatinine, Ser: 1 mg/dL (ref 0.61–1.24)
GFR, Estimated: >60 mL/min (ref >60)
Glucose, Bld: 127 mg/dL — ABNORMAL HIGH (ref 70–99)
Potassium: 4.2 mmol/L (ref 3.5–5.1)
Sodium: 135 mmol/L (ref 135–145)

## 2023-05-13 LAB — VITAMIN D 25 HYDROXY (VIT D DEFICIENCY, FRACTURES): Vit D, 25-Hydroxy: 62.47 ng/mL (ref 30–100)

## 2023-05-13 NOTE — Progress Notes (Signed)
 PROGRESS NOTE    Luke Foster  ZOX:[id number] DOB: [birth year] DOA: 05/11/2023 PCP: Elfredia Nevins, MD   Brief Narrative:  Luke Foster is a 73 y.o. male with medical history significant for obstructive uropathy, neurogenic bladder with chronic indwelling Foley catheter, hypertension, restless leg syndrome, dementia.  Patient presents to our system after a mechanical fall with subsequent right knee pain.  Imaging confirms comminuted and displaced fracture of the distal right femoral diaphysis, discussed case with orthopedic surgery who recommended transfer to main campus.   Of note patient was recently hospitalized at our facility 3/10 - 3/22 for pyelonephritis, nephrolithiasis with stent placement 3/11.  Cultures grew Enterobacter.  Stay was complicated by small bowel obstruction managed conservatively.  Had worsening transaminitis and abdominal pain ultrasound showed mild gallbladder thickening, HIDA was ordered to rule out cholecystitis.  Assessment & Plan:   Principal Problem:   Closed fracture of right distal femur [facility]) Active Problems:   Bilateral lower extremity edema   Benign essential HTN   Restless leg syndrome   Dementia (HCC)   Chronic indwelling Foley catheter   Closed fracture of right distal femur (HCC) Acute on chronic ambulatory dysfunction -Status post mechanical fall, uses a walker at baseline  -Orthopedic surgery following, status post open reduction internal fixation of the right distal femur fracture on 05/12/2023 -Pain management somewhat difficult given chronic narcotic use, continue to titrate as appropriate  SIRS criteria without sepsis -Patient meets SIRS criteria in the setting of leukocytosis and tachycardia likely reactive in the setting of pain and fracture as above -No signs or symptoms of infection at this time  Acute anemia, unspecified  -Patient is without bleeding, anemia is noted prior to procedure -Likely partially hemodilutional given fluids  preoperatively in setting of n.p.o. status -Baseline hemoglobin previously around 13, at previous hospitalization last month hemoglobin stable around 7.5 -1 unit PRBC to transfuse today given hemoglobin of 6.8, continue to follow -Prior anemia panel remarkable for iron deficiency complicated by folate deficiency (B12 elevated) -Continue to advance diet as tolerated, discussed increasing iron intake as well as multivitamin  Subacute to chronic bilateral lower extremity edema -Patient reports new lower extremity edema bilaterally over the past 2 months - Repeat echo EF 65-70% without overt dysfunction - Bilateral lower extremity DVT study in 2022 negative for DVT, unclear if edema is as acute as family indicates given this testing nearly 3 years ago. Continues on PRN furosemide which is certainly reasonable - may benefit from vascular evaluation in the near future if this continues to affect ambulation  Dementia (HCC) Stable.  Lives at home.  Brother appears to be primary caretaker -Resume Effexor.   Restless leg syndrome Resume ropinirole.   Benign essential HTN Stable.  Resume home diltiazem, tamsulosin  DVT prophylaxis: enoxaparin (LOVENOX) injection 40 mg Start: 05/13/23 0800 SCDs Start: 05/12/23 1151 SCDs Start: 05/11/23 2040   Code Status:   Code Status: Full Code  Family Communication: Brother at bedside  Status is: Inpatient  Dispo: The patient is from: Home              Anticipated d/c is to: SNF              Anticipated d/c date is: 24-48 hours              Patient currently not medically stable for discharge  Consultants:  Orthopedic surgery  Procedures:  ORIF femur fracture, right 4/ 2/25  Antimicrobials:  Preoperatively  Subjective: No acute issues or  events overnight, pain currently well-controlled, looking forward to ambulating with therapy later today  Objective: Vitals:   05/12/23 1149 05/12/23 1553 05/12/23 2031 05/13/23 0526  BP: 127/81 114/73  111/62   Pulse: 85 91 85   Resp: 18 18 17    Temp: 98.4 F (36.9 C) 98 F (36.7 C) 98.4 F (36.9 C)   TempSrc: Oral Oral Oral   SpO2: 99% 96% 96%   Weight:    95.9 kg  Height:        Intake/Output Summary (Last 24 hours) at 05/13/2023 0720 Last data filed at 05/13/2023 0530 Gross per 24 hour  Intake 1118.35 ml  Output 1250 ml  Net -131.65 ml   Filed Weights   05/11/23 1234 05/13/23 0526  Weight: 81.6 kg 95.9 kg    Examination:  General:  Pleasantly resting in bed, No acute distress. HEENT:  Normocephalic atraumatic.  Sclerae nonicteric, noninjected.  Extraocular movements intact bilaterally. Neck:  Without mass or deformity.  Trachea is midline. Lungs:  Clear to auscultate bilaterally without rhonchi, wheeze, or rales. Heart:  Regular rate and rhythm.  Without murmurs, rubs, or gallops. Abdomen:  Soft, nontender, nondistended.  Without guarding or rebound. Extremities: Decreased range of motion right lower extremity  Data Reviewed: I have personally reviewed following labs and imaging studies  CBC: Recent Labs  Lab 05/11/23 1406 05/12/23 0606  WBC 16.7* 18.7*  HGB 7.6* 6.8*  HCT 24.5* 21.3*  MCV 97.6 94.2  PLT 382 427*   Basic Metabolic Panel: Recent Labs  Lab 05/11/23 1406 05/12/23 0606  NA 134* 137  K 3.6 4.0  CL 96* 102  CO2 21* 22  GLUCOSE 98 100*  BUN 26* 20  CREATININE 1.22 0.94  CALCIUM 8.6* 8.8*   GFR: Estimated Creatinine Clearance: 80.9 mL/min (by C-G formula based on SCr of 0.94 mg/dL). Liver Function Tests: Recent Labs  Lab 05/11/23 1406  AST 40  ALT 43  ALKPHOS 111  BILITOT 0.6  PROT 7.2  ALBUMIN 2.0*   Recent Results (from the past 240 hours)  Surgical pcr screen     Status: None   Collection Time: 05/12/23 12:40 AM   Specimen: Nasal Mucosa; Nasal Swab  Result Value Ref Range Status   MRSA, PCR NEGATIVE NEGATIVE Final   Staphylococcus aureus NEGATIVE NEGATIVE Final    Comment: (NOTE) The Xpert SA Assay (FDA approved for NASAL  specimens in patients 62 years of age and older), is one component of a comprehensive surveillance program. It is not intended to diagnose infection nor to guide or monitor treatment. Performed at [facility], [address] [place], [place] [address]      Radiology Studies: ECHOCARDIOGRAM COMPLETE Result Date: 05/12/2023    ECHOCARDIOGRAM REPORT   Patient Name:   PHILO KURTZ Date of Exam: 05/12/2023 Medical Rec #:  [id number]     Height:       68.0 in Accession #:    [id number]    Weight:       180.0 lb Date of Birth:  09/26/1950     BSA:          1.954 m Patient Age:    73 years      BP:           127/81 mmHg Patient Gender: M             HR:           86 bpm. Exam Location:  Inpatient Procedure: 2D Echo,  Cardiac Doppler and Color Doppler (Both Spectral and Color            Flow Doppler were utilized during procedure). Indications:    Pedal edema  History:        Patient has no prior history of Echocardiogram examinations.                 Abnormal ECG, Signs/Symptoms:Altered Mental Status; Risk                 Factors:Hypertension and Dyslipidemia.  Sonographer:    Vern Claude Referring Phys: [id number] Heloise Beecham EMOKPAE IMPRESSIONS  1. Left ventricular ejection fraction, by estimation, is 65 to 70%. The left ventricle has normal function. The left ventricle has no regional wall motion abnormalities. Left ventricular diastolic parameters were normal.  2. Right ventricular systolic function is normal. The right ventricular size is normal.  3. The mitral valve is normal in structure. Trivial mitral valve regurgitation. No evidence of mitral stenosis.  4. The aortic valve was not well visualized. Aortic valve regurgitation is not visualized. No aortic stenosis is present. FINDINGS  Left Ventricle: Left ventricular ejection fraction, by estimation, is 65 to 70%. The left ventricle has normal function. The left ventricle has no regional wall motion abnormalities. The left ventricular internal cavity size  was normal in size. There is  no left ventricular hypertrophy. Left ventricular diastolic parameters were normal. Right Ventricle: The right ventricular size is normal. No increase in right ventricular wall thickness. Right ventricular systolic function is normal. The tricuspid regurgitant velocity is 1.35 m/s, and with an assumed right atrial pressure of 8 mmHg, the estimated right ventricular systolic pressure is 15.3 mmHg. Left Atrium: Left atrial size was normal in size. Right Atrium: Right atrial size was normal in size. Pericardium: There is no evidence of pericardial effusion. Mitral Valve: The mitral valve is normal in structure. Trivial mitral valve regurgitation. No evidence of mitral valve stenosis. MV peak gradient, 3.2 mmHg. The mean mitral valve gradient is 2.0 mmHg. Tricuspid Valve: The tricuspid valve is normal in structure. Tricuspid valve regurgitation is trivial. Aortic Valve: The aortic valve was not well visualized. Aortic valve regurgitation is not visualized. No aortic stenosis is present. Aortic valve mean gradient measures 5.0 mmHg. Aortic valve peak gradient measures 11.0 mmHg. Aortic valve area, by VTI measures 2.76 cm. Pulmonic Valve: The pulmonic valve was not well visualized. Pulmonic valve regurgitation is not visualized. Aorta: The aortic root and ascending aorta are structurally normal, with no evidence of dilitation. IAS/Shunts: The interatrial septum was not well visualized.  LEFT VENTRICLE PLAX 2D LVIDd:         4.10 cm      Diastology LVIDs:         2.60 cm      LV e' medial:    12.43 cm/s LV PW:         0.90 cm      LV E/e' medial:  8.6 LV IVS:        0.60 cm      LV e' lateral:   10.20 cm/s LVOT diam:     1.90 cm      LV E/e' lateral: 10.5 LV SV:         76 LV SV Index:   39 LVOT Area:     2.84 cm  LV Volumes (MOD) LV vol d, MOD A2C: 145.0 ml LV vol d, MOD A4C: 157.0 ml LV vol s, MOD A2C: 30.7 ml LV  vol s, MOD A4C: 51.5 ml LV SV MOD A2C:     114.3 ml LV SV MOD A4C:     157.0  ml LV SV MOD BP:      110.9 ml RIGHT VENTRICLE          IVC RV Basal diam:  3.30 cm  IVC diam: 2.10 cm RV Mid diam:    2.60 cm TAPSE (M-mode): 2.1 cm LEFT ATRIUM             Index        RIGHT ATRIUM           Index LA diam:        2.90 cm 1.48 cm/m   RA Area:     10.30 cm LA Vol (A2C):   33.8 ml 17.30 ml/m  RA Volume:   18.80 ml  9.62 ml/m LA Vol (A4C):   27.7 ml 14.17 ml/m LA Biplane Vol: 30.8 ml 15.76 ml/m  AORTIC VALVE                     PULMONIC VALVE AV Area (Vmax):    2.29 cm      PV Vmax:       1.31 m/s AV Area (Vmean):   2.26 cm      PV Peak grad:  6.8 mmHg AV Area (VTI):     2.76 cm AV Vmax:           166.00 cm/s AV Vmean:          107.000 cm/s AV VTI:            0.274 m AV Peak Grad:      11.0 mmHg AV Mean Grad:      5.0 mmHg LVOT Vmax:         134.00 cm/s LVOT Vmean:        85.400 cm/s LVOT VTI:          0.267 m LVOT/AV VTI ratio: 0.97  AORTA Ao Root diam: 3.50 cm Ao Asc diam:  3.10 cm MITRAL VALVE                TRICUSPID VALVE MV Area (PHT): 5.79 cm     TR Peak grad:   7.3 mmHg MV Area VTI:   4.10 cm     TR Vmax:        135.00 cm/s MV Peak grad:  3.2 mmHg MV Mean grad:  2.0 mmHg     SHUNTS MV Vmax:       0.89 m/s     Systemic VTI:  0.27 m MV Vmean:      70.5 cm/s    Systemic Diam: 1.90 cm MV Decel Time: 131 msec MV E velocity: 107.00 cm/s MV A velocity: 88.80 cm/s MV E/A ratio:  1.20 Epifanio Lesches MD Electronically signed by Epifanio Lesches MD Signature Date/Time: 05/12/2023/6:40:51 PM    Final    DG Knee Right Port Result Date: 05/12/2023 CLINICAL DATA:  Distal right femoral fracture. EXAM: PORTABLE RIGHT KNEE - 1-2 VIEW COMPARISON:  May 11, 2023. FINDINGS: Status post surgical internal fixation of distal right femoral fracture. Improved alignment of fracture components is noted. IMPRESSION: Status post surgical internal fixation of distal right femoral fracture. Electronically Signed   By: Lupita Raider M.D.   On: 05/12/2023 14:26   DG FEMUR, MIN 2 VIEWS RIGHT Result  Date: 05/12/2023 CLINICAL DATA:  Open reduction internal fixation of right distal femur fracture. EXAM:  RIGHT FEMUR 2 VIEWS; DG C-ARM 1-60 MIN-NO REPORT Radiation exposure index: 2.88 mGy. COMPARISON:  May 11, 2023. FINDINGS: Six intraoperative fluoroscopic images were obtained of the right femur. These demonstrate surgical internal fixation of distal right femoral fracture. IMPRESSION: Fluoroscopic guidance provided during surgical internal fixation of distal right femoral fracture. Electronically Signed   By: Lupita Raider M.D.   On: 05/12/2023 14:25   DG C-Arm 1-60 Min-No Report Result Date: 05/12/2023 Fluoroscopy was utilized by the requesting physician.  No radiographic interpretation.   DG Knee 2 Views Right Result Date: 05/11/2023 CLINICAL DATA:  Fall and right lower extremity pain. EXAM: DG HIP (WITH OR WITHOUT PELVIS) 2-3V RIGHT; RIGHT FEMUR 2 VIEWS; RIGHT KNEE - 1-2 VIEW COMPARISON:  None Available. FINDINGS: Evaluation is limited due to osteopenia and body habitus. There is a comminuted, oblique fracture of the distal right femoral diaphysis with approximately 2 cm medial and posterior displacement of the distal fracture fragment. No dislocation. Mild arthritic changes of the knee. The soft tissues are unremarkable. IMPRESSION: Comminuted, displaced fracture of the distal right femoral diaphysis. Electronically Signed   By: Elgie Collard M.D.   On: 05/11/2023 15:36   DG Hip Unilat W or Wo Pelvis 2-3 Views Right Result Date: 05/11/2023 CLINICAL DATA:  Fall and right lower extremity pain. EXAM: DG HIP (WITH OR WITHOUT PELVIS) 2-3V RIGHT; RIGHT FEMUR 2 VIEWS; RIGHT KNEE - 1-2 VIEW COMPARISON:  None Available. FINDINGS: Evaluation is limited due to osteopenia and body habitus. There is a comminuted, oblique fracture of the distal right femoral diaphysis with approximately 2 cm medial and posterior displacement of the distal fracture fragment. No dislocation. Mild arthritic changes of the knee. The  soft tissues are unremarkable. IMPRESSION: Comminuted, displaced fracture of the distal right femoral diaphysis. Electronically Signed   By: Elgie Collard M.D.   On: 05/11/2023 15:36   DG Femur Min 2 Views Right Result Date: 05/11/2023 CLINICAL DATA:  Fall and right lower extremity pain. EXAM: DG HIP (WITH OR WITHOUT PELVIS) 2-3V RIGHT; RIGHT FEMUR 2 VIEWS; RIGHT KNEE - 1-2 VIEW COMPARISON:  None Available. FINDINGS: Evaluation is limited due to osteopenia and body habitus. There is a comminuted, oblique fracture of the distal right femoral diaphysis with approximately 2 cm medial and posterior displacement of the distal fracture fragment. No dislocation. Mild arthritic changes of the knee. The soft tissues are unremarkable. IMPRESSION: Comminuted, displaced fracture of the distal right femoral diaphysis. Electronically Signed   By: Elgie Collard M.D.   On: 05/11/2023 15:36   DG Chest 2 View Result Date: 05/11/2023 CLINICAL DATA:  fall EXAM: CHEST - 2 VIEW COMPARISON:  04/19/2023 FINDINGS: Improving linear opacities at the left base. Mild atelectasis at the right lung base. Heart size and mediastinal contours are within normal limits. Blunting of posterior costophrenic angles suggesting small effusions, new since previous. Visualized bones unremarkable. IMPRESSION: 1. Small bilateral pleural effusions. 2. Improving left basilar atelectasis. Electronically Signed   By: Corlis Leak M.D.   On: 05/11/2023 15:35   DG Knee 2 Views Left Result Date: 05/11/2023 CLINICAL DATA:  Status post fall with knee pain EXAM: LEFT KNEE - 2 VIEW COMPARISON:  None Available. FINDINGS: Diffuse osteopenia. There are no findings of fracture or dislocation. No joint effusion. Mild tricompartmental degenerative changes of the knee. Diffuse subcutaneous soft tissue reticulations. IMPRESSION: 1. No acute displaced fracture or dislocation. 2. Mild tricompartmental degenerative changes of the knee. Electronically Signed   By: Milus Height.D.  On: 05/11/2023 15:34   Scheduled Meds:  sodium chloride   Intravenous Once   acetaminophen  650 mg Oral Q6H   Chlorhexidine Gluconate Cloth  6 each Topical Daily   diltiazem  120 mg Oral Daily   docusate sodium  100 mg Oral BID   enoxaparin (LOVENOX) injection  40 mg Subcutaneous Q24H   feeding supplement  237 mL Oral BID BM   mirabegron ER  25 mg Oral QHS   multivitamin with minerals  1 tablet Oral Daily   tamsulosin  0.4 mg Oral QHS   venlafaxine XR  75 mg Oral BID   Continuous Infusions:   ceFAZolin (ANCEF) IV 2 g (05/13/23 0317)     LOS: 2 days   Time spent:  Azucena Fallen, DO [facility]  If 7PM-7AM, please contact night-coverage [email or web address]  05/13/2023, 7:20 AM

## 2023-05-13 NOTE — TOC Initial Note (Addendum)
 Transition of Care [facility]) - Initial/Assessment Note    Patient Details  Name: Luke Foster MRN: [id number] Date of Birth: 02/18/1950  [facility]) CM/SW Contact:    Lorri Frederick, LCSW Phone Number: 05/13/2023, 12:20 PM  Clinical Narrative:     Pt listed as oriented x3-4, CSW spoke with him about PT recommendation for SNF.  Pt is agreeable, states has been to [facility] in the past, would be open to returning.  Medicare choice document provided.  Pt from home with caregivers daily.  Permission given to speak with brother Orvilla Fus.  CSW spoke with [name].  Per epic, pt has legal guardian.  CSW clarified this with [name]--he has POA (form is in epic) but he is not legal guardian.  [name] also in agreement with plan for SNF but pt did not have good experience at [facility].  Orvilla Fus has been in touch with [facility] and would like pt to go there.  Per Orvilla Fus, pt can have appropriate conversations but very limited short term memory.  Caregivers in place 24/7 for pt to remain at home.  Referral sent out in hub for SNF, CSW reached out to [facility] to review.              1320: [facility] does offer bed, CSW spoke with Currie, who confirmed he wants to accept this offer.   Expected Discharge Plan: Skilled Nursing Facility Barriers to Discharge: Continued Medical Work up, SNF Pending bed offer   Patient Goals and CMS Choice Patient states their goals for this hospitalization and ongoing recovery are:: be independent CMS Medicare.gov Compare Post Acute Care list provided to:: Patient Choice offered to / list presented to : Patient, Sibling (brother/POA [name])      Expected Discharge Plan and Services In-house Referral: Clinical Social Work   Post Acute Care Choice: Skilled Nursing Facility Living arrangements for the past 2 months: Single Family Home                                      Prior Living Arrangements/Services Living arrangements for the past 2 months:  Single Family Home Lives with:: Self Patient language and need for interpreter reviewed:: Yes Do you feel safe going back to the place where you live?: Yes      Need for Family Participation in Patient Care: Yes (Comment) Care giver support system in place?: Yes (comment) Current home services: Homehealth aide (pt has 24/7 caregivers with him) Criminal Activity/Legal Involvement Pertinent to Current Situation/Hospitalization: No - Comment as needed  Activities of Daily Living   ADL Screening (condition at time of admission) Independently performs ADLs?: No Does the patient have a NEW difficulty with bathing/dressing/toileting/self-feeding that is expected to last >3 days?: Yes (Initiates electronic notice to provider for possible OT consult) Does the patient have a NEW difficulty with getting in/out of bed, walking, or climbing stairs that is expected to last >3 days?: Yes (Initiates electronic notice to provider for possible PT consult) Does the patient have a NEW difficulty with communication that is expected to last >3 days?: No Is the patient deaf or have difficulty hearing?: No Does the patient have difficulty seeing, even when wearing glasses/contacts?: No Does the patient have difficulty concentrating, remembering, or making decisions?: Yes  Permission Sought/Granted Permission sought to share information with : Family Supports Permission granted to share information with : Yes, Verbal Permission Granted  Share Information with  NAME: brother Orvilla Fus  Permission granted to share info w AGENCY: SNF        Emotional Assessment Appearance:: Appears stated age Attitude/Demeanor/Rapport: Engaged Affect (typically observed): Appropriate, Pleasant Orientation: : Oriented to Self, Oriented to Place, Oriented to Situation      Admission diagnosis:  Closed fracture of right distal femur (HCC) [S72.401A] Fall, initial encounter [W19.XXXA] Closed fracture of distal end of right femur,  unspecified fracture morphology, initial encounter (HCC) [S72.401A] Patient Active Problem List   Diagnosis Date Noted   Malnutrition of moderate degree 05/13/2023   Closed fracture of right distal femur (HCC) 05/11/2023   Chronic indwelling Foley catheter 05/11/2023   Bilateral lower extremity edema 05/11/2023   Pyelonephritis 04/19/2023   Dementia (HCC) 04/19/2023   Complicated UTI (urinary tract infection) 06/17/2022   Small bowel obstruction (HCC) 01/12/2022   Leukocytosis 01/12/2022   Anxiety 01/12/2022   Depression 01/12/2022   Restless leg syndrome 01/12/2022   Intractable nausea and vomiting 11/30/2021   Ileus (HCC) 11/30/2021   Obstructive uropathy 11/29/2021   AKI (acute kidney injury) (HCC) 11/29/2021   Generalized weakness 04/30/2020   Constipation 04/30/2020   Nephrolithiasis 04/30/2020   Pressure injury of skin 04/30/2020   Urinary retention 04/29/2020   Pain in limb 05/09/2019   Acute metabolic encephalopathy 03/08/2019   Bilateral hydronephrosis 03/07/2019   Altered mental status 03/07/2019   Hypertension    Abnormal ECG 02/28/2019   Atherosclerosis of abdominal aorta (HCC) 02/28/2019   Benign essential HTN 02/28/2019   Hyperlipidemia, mixed 02/28/2019   Neuropathy 01/21/2017   Pain in right foot 01/21/2017   Pain in left foot 01/21/2017   Chronic pain syndrome 01/21/2017   Post laminectomy syndrome 01/21/2017   Status post insertion of spinal cord stimulator 01/21/2017   Uncomplicated opioid dependence (HCC) 01/21/2017   Chronic bilateral low back pain without sciatica 11/27/2015   Degenerative disc disease, lumbar 06/17/2015   Encounter for therapeutic drug monitoring 06/17/2015   Nervous system device, implant, or graft complication, initial encounter 04/06/2014   Lumbosacral radiculopathy 12/09/2012   PCP:  Elfredia Nevins, MD Pharmacy:   [id number] [place], [place] - [address]  [place] [address] Phone: [phone] Fax: [phone]     Social Drivers of Health (SDOH) Social History: SDOH Screenings   Food Insecurity: No Food Insecurity (05/11/2023)  Housing: Low Risk  (05/11/2023)  Transportation Needs: No Transportation Needs (05/11/2023)  Utilities: Not At Risk (05/11/2023)  Social Connections: Socially Isolated (05/11/2023)  Tobacco Use: Medium Risk (05/12/2023)   SDOH Interventions:     Readmission Risk Interventions    04/22/2023   10:55 AM 06/19/2022    1:54 PM 01/16/2022    1:14 PM  Readmission Risk Prevention Plan  Transportation Screening Complete Complete Complete  PCP or Specialist Appt within 3-5 Days Complete    HRI or Home Care Consult Complete Complete Complete  Social Work Consult for Recovery Care Planning/Counseling Complete Complete Complete  Palliative Care Screening Not Applicable Not Applicable Not Applicable  Medication Review Oceanographer) Complete Complete Complete

## 2023-05-13 NOTE — Evaluation (Signed)
 Occupational Therapy Evaluation Patient Details Name: Luke Foster MRN: [id number] DOB: [birth year] Today's Date: 05/13/2023   History of Present Illness   Pt is a 73 y.o. M who presents after a fall with right distal femur fx s/p ORIF. Significant PMH: dementia, paresthesia of bilateral feet, back surgery.     Clinical Impressions Pt admitted based on above, and was seen based on problem list below. PTA pt was living alone, independent with ADLs, and had an aide 7 days/wk, about 8 hours/day to assist with IADLs. Today pt is requiring set up  to total assist for ADLs. Pt c/o of muscle spasms at rest, presenting with significant edema in Bil LEs. Bed mobility was mod assist for LEs, good UE strength for trunk management. Pain significantly limiting pt, during this session, unable to maintain sit EOB.  Recommendation of <3 hours of skilled rehab daily to optimize independence levels. OT will continue to follow acutely to maximize functional independence.        If plan is discharge home, recommend the following:   A lot of help with walking and/or transfers;A lot of help with bathing/dressing/bathroom;Assistance with cooking/housework     Functional Status Assessment   Patient has had a recent decline in their functional status and demonstrates the ability to make significant improvements in function in a reasonable and predictable amount of time.     Equipment Recommendations   Other (comment) (Defer to next venue)      Precautions/Restrictions   Precautions Precautions: Fall Recall of Precautions/Restrictions: Intact Restrictions Weight Bearing Restrictions Per Provider Order: Yes RLE Weight Bearing Per Provider Order: Weight bearing as tolerated     Mobility Bed Mobility Overal bed mobility: Needs Assistance Bed Mobility: Supine to Sit, Sit to Supine     Supine to sit: Mod assist, HOB elevated, Used rails Sit to supine: Mod assist, HOB elevated, Used rails    General bed mobility comments: Assist for BLE's, cues for use of rail, pt able to manage trunk    Transfers Overall transfer level: Needs assistance                 General transfer comment: Unable to complete d/t pain      Balance Overall balance assessment: Needs assistance, History of Falls Sitting-balance support: Feet supported, Single extremity supported Sitting balance-Leahy Scale: Poor Sitting balance - Comments: reliant on BUE support Postural control: Posterior lean               ADL either performed or assessed with clinical judgement   ADL Overall ADL's : Needs assistance/impaired Eating/Feeding: Set up;Bed level   Grooming: Wash/dry face;Oral care;Set up;Bed level           Upper Body Dressing : Set up;Bed level   Lower Body Dressing: Total assistance;Sitting/lateral leans;Sit to/from stand Lower Body Dressing Details (indicate cue type and reason): Pt unable to don/doff socks               General ADL Comments: Pt in large amounts of pain, unable to complete functional transfers at this time     Vision Baseline Vision/History: 1 Wears glasses Vision Assessment?: No apparent visual deficits            Pertinent Vitals/Pain Pain Assessment Pain Assessment: Faces Faces Pain Scale: Hurts worst Pain Location: RLE, L knee Pain Descriptors / Indicators: Grimacing, Operative site guarding, Sharp, Moaning, Guarding Pain Intervention(s): Limited activity within patient's tolerance, Repositioned, Patient requesting pain meds-RN notified     Extremity/Trunk  Assessment Upper Extremity Assessment Upper Extremity Assessment: Overall WFL for tasks assessed;Right hand dominant   Lower Extremity Assessment Lower Extremity Assessment: Defer to PT evaluation       Communication Communication Communication: No apparent difficulties Factors Affecting Communication: Hearing impaired   Cognition Arousal: Alert Behavior During Therapy: WFL for  tasks assessed/performed Cognition: History of cognitive impairments   Orientation impairments: Time, Situation         OT - Cognition Comments: PMH of dementia         Following commands: Intact       Cueing  General Comments   Cueing Techniques: Verbal cues;Visual cues  Significant edema in Bil LEs           Home Living Family/patient expects to be discharged to:: Private residence Living Arrangements: Alone Available Help at Discharge: Personal care attendant Type of Home: House Home Access: Ramped entrance     Home Layout: One level     Bathroom Shower/Tub: Producer, television/film/video: Handicapped height Bathroom Accessibility: Yes How Accessible: Accessible via wheelchair Home Equipment: Shower seat - built Charity fundraiser (2 wheels);Cane - single point;Hand held shower head;BSC/3in1;Hospital bed   Additional Comments: Aide 7 days/wk, varying hours about 8 hours/day.      Prior Functioning/Environment Prior Level of Function : Needs assist       Physical Assist : ADLs (physical);Mobility (physical)   ADLs (physical): IADLs Mobility Comments: household ambulator using RW ADLs Comments: patient states independent with basic ADL, aide assists with cooking/cleaning, IADLs    OT Problem List: Decreased range of motion;Decreased safety awareness;Decreased activity tolerance;Pain;Decreased knowledge of use of DME or AE;Impaired balance (sitting and/or standing);Impaired sensation   OT Treatment/Interventions: Self-care/ADL training;Therapeutic activities;Therapeutic exercise;Patient/family education;DME and/or AE instruction;Balance training      OT Goals(Current goals can be found in the care plan section)   Acute Rehab OT Goals Patient Stated Goal: To go homr OT Goal Formulation: With patient Time For Goal Achievement: 05/27/23 Potential to Achieve Goals: Fair   OT Frequency:  Min 2X/week       AM-PAC OT "6 Clicks" Daily Activity      Outcome Measure Help from another person eating meals?: None Help from another person taking care of personal grooming?: A Little Help from another person toileting, which includes using toliet, bedpan, or urinal?: Total Help from another person bathing (including washing, rinsing, drying)?: A Lot Help from another person to put on and taking off regular upper body clothing?: A Little Help from another person to put on and taking off regular lower body clothing?: Total 6 Click Score: 14   End of Session Nurse Communication: Mobility status;Patient requests pain meds  Activity Tolerance: Patient limited by pain Patient left: in bed;with call bell/phone within reach;Other (comment) (Labs present)  OT Visit Diagnosis: Unsteadiness on feet (R26.81);History of falling (Z91.81);Muscle weakness (generalized) (M62.81)                Time: 0822-0906 OT Time Calculation (min): 44 min Charges:  OT General Charges $OT Visit: 1 Visit OT Evaluation $OT Eval Moderate Complexity: 1 Mod OT Treatments $Self Care/Home Management : 23-37 mins  Ivor Messier, OT  Acute Rehabilitation Services Office [phone] Secure chat preferred   Marilynne Drivers 05/13/2023, 9:20 AM

## 2023-05-13 NOTE — Progress Notes (Signed)
 Orthopaedic Trauma Progress Note  SUBJECTIVE: Doing well this morning, pain controlled at rest. Has not bene up out of bed yet since surgery. Denies any significant numbness or tingling. No chest pain. No SOB. No nausea/vomiting. No other complaints. Tolerating diet and fluids  OBJECTIVE:  Vitals:   05/12/23 2031 05/13/23 0747  BP: 111/62 124/71  Pulse: 85 73  Resp: 17 18  Temp: 98.4 F (36.9 C) 98 F (36.7 C)  SpO2: 96% 100%    General: Sitting up in bed, eating breakfast. NAD Respiratory: No increased work of breathing.  RLE: Dressing CDI. Tender through the thigh as expected. Ankle DF/PF intact. Endorses sensation throughout extremity. Ankle DF/PF intact. +DP pulse  IMAGING: Stable post op imaging.   LABS: No results found for this or any previous visit (from the past 24 hours).  ASSESSMENT: Luke Foster is a 73 y.o. male, 1 Day Post-Op s/p ORIF RIGHT DISTAL FEMUR  CV/Blood loss: Acute blood loss anemia, Hgb 6.8 pre-op on 05/12/23. Received 3 units PRBCs. CBC pending this AM. . Hemodynamically stable  PLAN: Weightbearing: WBAT RLE ROM:  Unrestricted ROM  Incisional and dressing care: Reinforce dressings as needed  Showering:  ok to begin getting incisions wet 05/15/23 Orthopedic device(s): None  Pain management:  1. Tylenol 650 mg q 6 hours scheduled 2. Robaxin 500 mg q 6 hours PRN 3. Oxycodone 15 mg q 4 hours PRN 4. Morphine 0.5-1 mg q 2 hours PRN VTE prophylaxis: Lovenox, SCDs ID:  Ancef 2gm post op Foley/Lines:  No foley, KVO IVFs Impediments to Fracture Healing: Vit d level 62, no additional supplementation needed Dispo: PT/OT eval today, dispo pending. Will likely need SNF. Plan to change dressing  RLE tomorrow 05/14/23. Okay for discharge from ortho standpoint once cleared by medicine team and therapies  D/C recommendations: - Oxycodone, Robaxin, Tylenol for pain control - Eliquis 2.5 mg BID x 30 days for DVT prophylaxis - No additional need for Vit D  supplementation  Follow - up plan: 2 weeks after d/c   Contact information:  Truitt Merle MD, Thyra Breed PA-C. After hours and holidays please check [email or web address] for group call information for [facility]   Thompson Caul, PA-C [phone] (office) [email or web address]

## 2023-05-13 NOTE — Progress Notes (Signed)
 Physical Therapy Treatment Patient Details Name: Luke Foster MRN: [id number] DOB: 1950/10/26 Today's Date: 05/13/2023   History of Present Illness Pt is a 73 y.o. M who presents after a fall with right distal femur fx s/p ORIF. Significant PMH: dementia, paresthesia of bilateral feet, back surgery.    PT Comments  Patient progressing this session with attempting sit to stand x 5 reps in [place].  Still limited by pain and LE weakness.  He was also able to participate in LE therex, though L LE weak and painful as well as R.  Continue to feel he will benefit from inpatient rehab at d/c.  PT will continue to follow.     If plan is discharge home, recommend the following: Two people to help with walking and/or transfers;Two people to help with bathing/dressing/bathroom   Can travel by private vehicle     No  Equipment Recommendations  Hoyer lift;Wheelchair (measurements PT);Wheelchair cushion (measurements PT)    Recommendations for Other Services       Precautions / Restrictions Precautions Precautions: Fall Recall of Precautions/Restrictions: Impaired Restrictions RLE Weight Bearing Per Provider Order: Weight bearing as tolerated     Mobility  Bed Mobility Overal bed mobility: Needs Assistance Bed Mobility: Supine to Sit, Sit to Supine     Supine to sit: Mod assist, HOB elevated, Used rails Sit to supine: Mod assist, HOB elevated, Used rails, +2 for physical assistance   General bed mobility comments: assist for moving legs then lifting trunk pt pulling up on rail, then to supine brother helped with legs and PT assisted to move hips    Transfers Overall transfer level: Needs assistance   Transfers: Sit to/from Stand Sit to Stand: Via lift equipment, From elevated surface, Max assist           General transfer comment: sit to stand attempts from EOB at higher level to [place]; pt unable to achieve lift off though pulling up and transferring some weight to his legs,  practiced 5 times for LE strength    Ambulation/Gait                   Stairs             Wheelchair Mobility     Tilt Bed    Modified Rankin (Stroke Patients Only)       Balance Overall balance assessment: Needs assistance, History of Falls Sitting-balance support: Feet supported Sitting balance-Leahy Scale: Fair Sitting balance - Comments: able to sit EOB unsupported     Standing balance-Leahy Scale: Zero Standing balance comment: unable to stand                            Communication Communication Communication: No apparent difficulties  Cognition Arousal: Alert Behavior During Therapy: WFL for tasks assessed/performed   PT - Cognitive impairments: History of cognitive impairments, Memory                       PT - Cognition Comments: Hx of dementia, pt pleasant and following cues Following commands: Intact      Cueing    Exercises General Exercises - Lower Extremity Ankle Circles/Pumps: AROM, Both, 10 reps, Supine Quad Sets: AROM, Right, 10 reps, Supine Short Arc Quad: AAROM, Left, 10 reps, Supine Heel Slides: AAROM, Both, Supine, 10 reps    General Comments General comments (skin integrity, edema, etc.): B LE edema worse in R foot, note  also edema and pain on L knee with lidoderm patch on knee.      Pertinent Vitals/Pain Pain Assessment Pain Assessment: Faces Faces Pain Scale: Hurts whole lot Pain Location: RLE, L knee Pain Descriptors / Indicators: Grimacing, Operative site guarding, Sharp, Moaning, Guarding Pain Intervention(s): Monitored during session, Repositioned, Ice applied    Home Living                          Prior Function            PT Goals (current goals can now be found in the care plan section) Progress towards PT goals: Progressing toward goals    Frequency    Min 3X/week      PT Plan      Co-evaluation              AM-PAC PT "6 Clicks" Mobility   Outcome  Measure  Help needed turning from your back to your side while in a flat bed without using bedrails?: A Lot Help needed moving from lying on your back to sitting on the side of a flat bed without using bedrails?: A Lot Help needed moving to and from a bed to a chair (including a wheelchair)?: Total Help needed standing up from a chair using your arms (e.g., wheelchair or bedside chair)?: Total Help needed to walk in hospital room?: Total Help needed climbing 3-5 steps with a railing? : Total 6 Click Score: 8    End of Session Equipment Utilized During Treatment: Gait belt Activity Tolerance: Patient limited by pain Patient left: in bed;with call bell/phone within reach;with bed alarm set;with family/visitor present   PT Visit Diagnosis: Other abnormalities of gait and mobility (R26.89);Pain Pain - Right/Left: Right (both) Pain - part of body: Knee     Time: 1610-9604 PT Time Calculation (min) (ACUTE ONLY): 28 min  Charges:    $Therapeutic Exercise: 8-22 mins $Therapeutic Activity: 8-22 mins PT General Charges $$ ACUTE PT VISIT: 1 Visit                     Sheran Lawless, PT Acute Rehabilitation Services Office:[phone] 05/13/2023    Elray Mcgregor 05/13/2023, 5:20 PM

## 2023-05-13 NOTE — NC FL2 (Signed)
 [place] MEDICAID FL2 LEVEL OF CARE FORM     IDENTIFICATION  Patient Name: Luke Foster Birthdate: 11-28-1950 Sex: male Admission Date (Current Location): 05/11/2023  [facility] and [place] Number:  Producer, television/film/video and Address:  [facility] [place], [address], [place], [place] [address]      Provider Number: [id number]  Attending Physician Name and Address:  Azucena Fallen, MD  Relative Name and Phone Number:  Doria,[name] Brother [phone]    Current Level of Care: Hospital Recommended Level of Care: Skilled Nursing Facility Prior Approval Number:    Date Approved/Denied:   PASRR Number: [id number]  Discharge Plan: SNF    Current Diagnoses: Patient Active Problem List   Diagnosis Date Noted   Malnutrition of moderate degree 05/13/2023   Closed fracture of right distal femur (HCC) 05/11/2023   Chronic indwelling Foley catheter 05/11/2023   Bilateral lower extremity edema 05/11/2023   Pyelonephritis 04/19/2023   Dementia (HCC) 04/19/2023   Complicated UTI (urinary tract infection) 06/17/2022   Small bowel obstruction (HCC) 01/12/2022   Leukocytosis 01/12/2022   Anxiety 01/12/2022   Depression 01/12/2022   Restless leg syndrome 01/12/2022   Intractable nausea and vomiting 11/30/2021   Ileus (HCC) 11/30/2021   Obstructive uropathy 11/29/2021   AKI (acute kidney injury) (HCC) 11/29/2021   Generalized weakness 04/30/2020   Constipation 04/30/2020   Nephrolithiasis 04/30/2020   Pressure injury of skin 04/30/2020   Urinary retention 04/29/2020   Pain in limb 05/09/2019   Acute metabolic encephalopathy 03/08/2019   Bilateral hydronephrosis 03/07/2019   Altered mental status 03/07/2019   Hypertension    Abnormal ECG 02/28/2019   Atherosclerosis of abdominal aorta (HCC) 02/28/2019   Benign essential HTN 02/28/2019   Hyperlipidemia, mixed 02/28/2019   Neuropathy 01/21/2017   Pain in right foot 01/21/2017   Pain in left foot  01/21/2017   Chronic pain syndrome 01/21/2017   Post laminectomy syndrome 01/21/2017   Status post insertion of spinal cord stimulator 01/21/2017   Uncomplicated opioid dependence (HCC) 01/21/2017   Chronic bilateral low back pain without sciatica 11/27/2015   Degenerative disc disease, lumbar 06/17/2015   Encounter for therapeutic drug monitoring 06/17/2015   Nervous system device, implant, or graft complication, initial encounter 04/06/2014   Lumbosacral radiculopathy 12/09/2012    Orientation RESPIRATION BLADDER Height & Weight     Self, Situation, Place  Normal Incontinent, Indwelling catheter Weight: 211 lb 6.7 oz (95.9 kg) Height:  5\' 8"  (172.7 cm)  BEHAVIORAL SYMPTOMS/MOOD NEUROLOGICAL BOWEL NUTRITION STATUS      Incontinent Diet (see discharge summary)  AMBULATORY STATUS COMMUNICATION OF NEEDS Skin   Total Care Verbally Surgical wounds, Other (Comment) (ecchymosis)                       Personal Care Assistance Level of Assistance  Bathing, Feeding, Dressing, Total care Bathing Assistance: Maximum assistance Feeding assistance: Limited assistance Dressing Assistance: Maximum assistance Total Care Assistance: Maximum assistance   Functional Limitations Info  Sight, Hearing, Speech Sight Info: Adequate Hearing Info: Adequate Speech Info: Adequate    SPECIAL CARE FACTORS FREQUENCY  PT (By licensed PT), OT (By licensed OT)     PT Frequency: 5x week OT Frequency: 5x week            Contractures Contractures Info: Not present    Additional Factors Info  Code Status, Allergies Code Status Info: full Allergies Info: Erythromycin  Current Medications (05/13/2023):  This is the current hospital active medication list Current Facility-Administered Medications  Medication Dose Route Frequency Provider Last Rate Last Admin   0.9 %  sodium chloride infusion (Manually program via Guardrails IV Fluids)   Intravenous Once [place], PA-C        acetaminophen (TYLENOL) tablet 650 mg  650 mg Oral Q6H [place] Bali, PA-C   650 mg at 05/13/23 1204   Chlorhexidine Gluconate Cloth 2 % PADS 6 each  6 each Topical Daily Azucena Fallen, MD   6 each at 05/13/23 1204   diltiazem (CARDIZEM CD) 24 hr capsule 120 mg  120 mg Oral Daily Azucena Fallen, MD   120 mg at 05/13/23 0865   docusate sodium (COLACE) capsule 100 mg  100 mg Oral BID West Bali, PA-C   100 mg at 05/13/23 0837   enoxaparin (LOVENOX) injection 40 mg  40 mg Subcutaneous Q24H West Bali, PA-C   40 mg at 05/13/23 7846   feeding supplement (ENSURE ENLIVE / ENSURE PLUS) liquid 237 mL  237 mL Oral BID BM Azucena Fallen, MD       hydrALAZINE (APRESOLINE) tablet 10 mg  10 mg Oral Q6H PRN [place] Bali, PA-C       methocarbamol (ROBAXIN) tablet 500 mg  500 mg Oral Q6H PRN West Bali, PA-C       Or   methocarbamol (ROBAXIN) injection 500 mg  500 mg Intravenous Q6H PRN West Bali, PA-C   500 mg at 05/13/23 0845   metoCLOPramide (REGLAN) tablet 5-10 mg  5-10 mg Oral Q8H PRN West Bali, PA-C       Or   metoCLOPramide (REGLAN) injection 5-10 mg  5-10 mg Intravenous Q8H PRN West Bali, PA-C       mirabegron ER (MYRBETRIQ) tablet 25 mg  25 mg Oral QHS West Bali, PA-C   25 mg at 05/12/23 2145   morphine (PF) 2 MG/ML injection 0.5-1 mg  0.5-1 mg Intravenous Q2H PRN Thyra Breed A, PA-C       multivitamin with minerals tablet 1 tablet  1 tablet Oral Daily Azucena Fallen, MD   1 tablet at 05/13/23 0837   ondansetron (ZOFRAN) tablet 4 mg  4 mg Oral Q6H PRN [place] Bali, PA-C       Or   ondansetron (ZOFRAN) injection 4 mg  4 mg Intravenous Q6H PRN McClung, Sarah A, PA-C       oxyCODONE (Oxy IR/ROXICODONE) immediate release tablet 15 mg  15 mg Oral Q4H PRN Azucena Fallen, MD   15 mg at 05/13/23 9629   senna-docusate (Senokot-S) tablet 1 tablet  1 tablet Oral QHS PRN Emokpae, Ejiroghene E, MD       tamsulosin (FLOMAX)  capsule 0.4 mg  0.4 mg Oral QHS Emokpae, Ejiroghene E, MD   0.4 mg at 05/12/23 2145   venlafaxine XR (EFFEXOR-XR) 24 hr capsule 75 mg  75 mg Oral BID Azucena Fallen, MD   75 mg at 05/13/23 5284     Discharge Medications: Please see discharge summary for a list of discharge medications.  Relevant Imaging Results:  Relevant Lab Results:   Additional Information SS# [id number]  Dossie Der, Einar [place], [place]

## 2023-05-13 NOTE — Progress Notes (Addendum)
 Transition of Care [facility]) - CAGE-AID Screening   Patient Details  Name: Luke Foster MRN: [id number] Date of Birth: 24-Jan-1951  [facility]) CM/SW Contact:    Katha Hamming, RN Phone Number: 05/13/2023, 10:06 PM    CAGE-AID Screening: Substance Abuse Screening unable to be completed due to: : Patient unable to participate (hx dementia) No hx of alcohol/substance abuse.

## 2023-05-14 DIAGNOSIS — G2581 Restless legs syndrome: Secondary | ICD-10-CM | POA: Diagnosis not present

## 2023-05-14 DIAGNOSIS — S72451D Displaced supracondylar fracture without intracondylar extension of lower end of right femur, subsequent encounter for closed fracture with routine healing: Secondary | ICD-10-CM | POA: Diagnosis not present

## 2023-05-14 DIAGNOSIS — L89313 Pressure ulcer of right buttock, stage 3: Secondary | ICD-10-CM | POA: Diagnosis not present

## 2023-05-14 DIAGNOSIS — E440 Moderate protein-calorie malnutrition: Secondary | ICD-10-CM | POA: Diagnosis not present

## 2023-05-14 DIAGNOSIS — R319 Hematuria, unspecified: Secondary | ICD-10-CM | POA: Diagnosis not present

## 2023-05-14 DIAGNOSIS — F321 Major depressive disorder, single episode, moderate: Secondary | ICD-10-CM | POA: Diagnosis not present

## 2023-05-14 DIAGNOSIS — M545 Low back pain, unspecified: Secondary | ICD-10-CM | POA: Diagnosis not present

## 2023-05-14 DIAGNOSIS — N179 Acute kidney failure, unspecified: Secondary | ICD-10-CM | POA: Diagnosis not present

## 2023-05-14 DIAGNOSIS — Z7401 Bed confinement status: Secondary | ICD-10-CM | POA: Diagnosis not present

## 2023-05-14 DIAGNOSIS — M533 Sacrococcygeal disorders, not elsewhere classified: Secondary | ICD-10-CM | POA: Diagnosis not present

## 2023-05-14 DIAGNOSIS — R2681 Unsteadiness on feet: Secondary | ICD-10-CM | POA: Diagnosis not present

## 2023-05-14 DIAGNOSIS — N139 Obstructive and reflux uropathy, unspecified: Secondary | ICD-10-CM | POA: Diagnosis not present

## 2023-05-14 DIAGNOSIS — L89322 Pressure ulcer of left buttock, stage 2: Secondary | ICD-10-CM | POA: Diagnosis not present

## 2023-05-14 DIAGNOSIS — S72491A Other fracture of lower end of right femur, initial encounter for closed fracture: Secondary | ICD-10-CM | POA: Diagnosis not present

## 2023-05-14 DIAGNOSIS — M84451D Pathological fracture, right femur, subsequent encounter for fracture with routine healing: Secondary | ICD-10-CM | POA: Diagnosis not present

## 2023-05-14 DIAGNOSIS — T84296A Other mechanical complication of internal fixation device of vertebrae, initial encounter: Secondary | ICD-10-CM | POA: Diagnosis not present

## 2023-05-14 DIAGNOSIS — L89310 Pressure ulcer of right buttock, unstageable: Secondary | ICD-10-CM | POA: Diagnosis not present

## 2023-05-14 DIAGNOSIS — R600 Localized edema: Secondary | ICD-10-CM | POA: Diagnosis not present

## 2023-05-14 DIAGNOSIS — G4700 Insomnia, unspecified: Secondary | ICD-10-CM | POA: Diagnosis not present

## 2023-05-14 DIAGNOSIS — S79929A Unspecified injury of unspecified thigh, initial encounter: Secondary | ICD-10-CM | POA: Diagnosis not present

## 2023-05-14 DIAGNOSIS — G629 Polyneuropathy, unspecified: Secondary | ICD-10-CM | POA: Diagnosis not present

## 2023-05-14 DIAGNOSIS — R531 Weakness: Secondary | ICD-10-CM | POA: Diagnosis not present

## 2023-05-14 DIAGNOSIS — I82419 Acute embolism and thrombosis of unspecified femoral vein: Secondary | ICD-10-CM | POA: Diagnosis not present

## 2023-05-14 DIAGNOSIS — G8911 Acute pain due to trauma: Secondary | ICD-10-CM | POA: Diagnosis not present

## 2023-05-14 DIAGNOSIS — M8580 Other specified disorders of bone density and structure, unspecified site: Secondary | ICD-10-CM | POA: Diagnosis not present

## 2023-05-14 DIAGNOSIS — Z978 Presence of other specified devices: Secondary | ICD-10-CM | POA: Diagnosis not present

## 2023-05-14 DIAGNOSIS — M14861 Arthropathies in other specified diseases classified elsewhere, right knee: Secondary | ICD-10-CM | POA: Diagnosis not present

## 2023-05-14 DIAGNOSIS — N319 Neuromuscular dysfunction of bladder, unspecified: Secondary | ICD-10-CM | POA: Diagnosis not present

## 2023-05-14 DIAGNOSIS — I10 Essential (primary) hypertension: Secondary | ICD-10-CM | POA: Diagnosis not present

## 2023-05-14 DIAGNOSIS — M51369 Other intervertebral disc degeneration, lumbar region without mention of lumbar back pain or lower extremity pain: Secondary | ICD-10-CM | POA: Diagnosis not present

## 2023-05-14 DIAGNOSIS — M6281 Muscle weakness (generalized): Secondary | ICD-10-CM | POA: Diagnosis not present

## 2023-05-14 DIAGNOSIS — Z743 Need for continuous supervision: Secondary | ICD-10-CM | POA: Diagnosis not present

## 2023-05-14 DIAGNOSIS — M79605 Pain in left leg: Secondary | ICD-10-CM | POA: Diagnosis not present

## 2023-05-14 DIAGNOSIS — F0390 Unspecified dementia without behavioral disturbance: Secondary | ICD-10-CM | POA: Diagnosis not present

## 2023-05-14 DIAGNOSIS — S72302D Unspecified fracture of shaft of left femur, subsequent encounter for closed fracture with routine healing: Secondary | ICD-10-CM | POA: Diagnosis not present

## 2023-05-14 DIAGNOSIS — S72401D Unspecified fracture of lower end of right femur, subsequent encounter for closed fracture with routine healing: Secondary | ICD-10-CM | POA: Diagnosis not present

## 2023-05-14 DIAGNOSIS — R8290 Unspecified abnormal findings in urine: Secondary | ICD-10-CM | POA: Diagnosis not present

## 2023-05-14 DIAGNOSIS — D649 Anemia, unspecified: Secondary | ICD-10-CM | POA: Diagnosis not present

## 2023-05-14 DIAGNOSIS — R278 Other lack of coordination: Secondary | ICD-10-CM | POA: Diagnosis not present

## 2023-05-14 DIAGNOSIS — E785 Hyperlipidemia, unspecified: Secondary | ICD-10-CM | POA: Diagnosis not present

## 2023-05-14 DIAGNOSIS — R339 Retention of urine, unspecified: Secondary | ICD-10-CM | POA: Diagnosis not present

## 2023-05-14 MED ORDER — METHOCARBAMOL 500 MG PO TABS: 500.0000 mg | ORAL_TABLET | Freq: Four times a day (QID) | ORAL | 0 refills | Status: AC | PRN

## 2023-05-14 MED ORDER — OXYCODONE HCL 15 MG PO TABS: 15.0000 mg | ORAL_TABLET | ORAL | 0 refills | Status: DC | PRN

## 2023-05-14 MED ORDER — APIXABAN 2.5 MG PO TABS: 2.5000 mg | ORAL_TABLET | Freq: Two times a day (BID) | ORAL | 0 refills | Status: AC

## 2023-05-14 NOTE — Progress Notes (Signed)
 Patient brother in room [place] and aware of discharge. Report called to [facility], spoke with [place], LPN to give report. AVS reviewed with her.

## 2023-05-14 NOTE — Care Management Important Message (Signed)
 Important Message  Patient Details  Name: Luke Foster MRN: [id number] Date of Birth: 02-23-1950   Important Message Given:  Yes - Medicare IM     Dorena Bodo 05/14/2023, 2:47 PM

## 2023-05-14 NOTE — TOC Transition Note (Signed)
 Transition of Care [facility]) - Discharge Note   Patient Details  Name: Luke Foster MRN: [id number] Date of Birth: 1951-01-07  Transition of Care [facility]) CM/SW Contact:  Lorri Frederick, LCSW Phone Number: 05/14/2023, 10:51 AM   Clinical Narrative:   Pt discharging to [facility], room 604.  RN call report to [phone].  PTAR called [phone].  Final next level of care: Skilled Nursing Facility Barriers to Discharge: Barriers Resolved   Patient Goals and CMS Choice Patient states their goals for this hospitalization and ongoing recovery are:: be independent CMS Medicare.gov Compare Post Acute Care list provided to:: Patient Choice offered to / list presented to : Patient, Sibling (brother/POA [name])      Discharge Placement              Patient chooses bed at: [facility] Patient to be transferred to facility by: ptar Name of family member notified: brother [name] Patient and family notified of of transfer: 05/14/23  Discharge Plan and Services Additional resources added to the After Visit Summary for   In-house Referral: Clinical Social Work   Post Acute Care Choice: Skilled Nursing Facility                               Social Drivers of Health (SDOH) Interventions SDOH Screenings   Food Insecurity: No Food Insecurity (05/11/2023)  Housing: Low Risk  (05/11/2023)  Transportation Needs: No Transportation Needs (05/11/2023)  Utilities: Not At Risk (05/11/2023)  Social Connections: Socially Isolated (05/11/2023)  Tobacco Use: Medium Risk (05/12/2023)     Readmission Risk Interventions    04/22/2023   10:55 AM 06/19/2022    1:54 PM 01/16/2022    1:14 PM  Readmission Risk Prevention Plan  Transportation Screening Complete Complete Complete  PCP or Specialist Appt within 3-5 Days Complete    HRI or Home Care Consult Complete Complete Complete  Social Work Consult for Recovery Care Planning/Counseling Complete Complete Complete  Palliative Care Screening Not Applicable  Not Applicable Not Applicable  Medication Review Oceanographer) Complete Complete Complete

## 2023-05-14 NOTE — TOC Progression Note (Signed)
 Transition of Care [facility]) - Progression Note    Patient Details  Name: Luke Foster MRN: [id number] Date of Birth: 03-25-1950  Transition of Care [facility]) CM/SW Contact  Lorri Frederick, LCSW Phone Number: 05/14/2023, 10:01 AM  Clinical Narrative:   SNF auth request submitted in [place] and approved: [id number], 5 days: 4/4-4/8.  CSW confirmed with [facility] that they can receive pt today.  MD informed.     Expected Discharge Plan: Skilled Nursing Facility Barriers to Discharge: Continued Medical Work up, SNF Pending bed offer  Expected Discharge Plan and Services In-house Referral: Clinical Social Work   Post Acute Care Choice: Skilled Nursing Facility Living arrangements for the past 2 months: Single Family Home                                       Social Determinants of Health (SDOH) Interventions SDOH Screenings   Food Insecurity: No Food Insecurity (05/11/2023)  Housing: Low Risk  (05/11/2023)  Transportation Needs: No Transportation Needs (05/11/2023)  Utilities: Not At Risk (05/11/2023)  Social Connections: Socially Isolated (05/11/2023)  Tobacco Use: Medium Risk (05/12/2023)    Readmission Risk Interventions    04/22/2023   10:55 AM 06/19/2022    1:54 PM 01/16/2022    1:14 PM  Readmission Risk Prevention Plan  Transportation Screening Complete Complete Complete  PCP or Specialist Appt within 3-5 Days Complete    HRI or Home Care Consult Complete Complete Complete  Social Work Consult for Recovery Care Planning/Counseling Complete Complete Complete  Palliative Care Screening Not Applicable Not Applicable Not Applicable  Medication Review Oceanographer) Complete Complete Complete

## 2023-05-14 NOTE — Discharge Summary (Signed)
 Physician Discharge Summary  Luke Foster:[id number] DOB: Jun 07, 1950 DOA: 05/11/2023  PCP: Elfredia Nevins, MD  Admit date: 05/11/2023 Discharge date: 05/14/2023  Admitted From: Home Disposition: SNF  Recommendations for Outpatient Follow-up:  Follow up with PCP in 1-2 weeks Follow-up with orthopedic surgery as scheduled  Discharge Condition: Stable CODE STATUS: Full Diet recommendation: Low-salt low-fat low-carb diet  Brief/Interim Summary: Luke Foster is a 73 y.o. male with medical history significant for obstructive uropathy, neurogenic bladder with chronic indwelling Foley catheter, hypertension, restless leg syndrome, dementia.  Patient presents to our system after a mechanical fall with subsequent right knee pain.  Imaging confirms comminuted and displaced fracture of the distal right femoral diaphysis, discussed case with orthopedic surgery who recommended transfer to main campus.  Patient admitted as above with acute right femur closed fracture status post ORIF with orthopedic surgery on 05/12/2023 without any complications.  Patient was notably anemic prior to surgery likely hemodilutional on chronic anemia of chronic disease versus iron deficiency anemia.  Patient also met SIRS criteria at intake likely reactive in the setting of pain given resolution without any overt signs of infection.  He is otherwise improving postoperatively as expected and stable and agreeable for discharge to SNF given PT OT recommendations.   Discharge Diagnoses:  Principal Problem:   Closed fracture of right distal femur (HCC) Active Problems:   Bilateral lower extremity edema   Benign essential HTN   Restless leg syndrome   Dementia (HCC)   Chronic indwelling Foley catheter   Malnutrition of moderate degree  Closed fracture of right distal femur (HCC) Acute on chronic ambulatory dysfunction -Status post mechanical fall, uses a walker at baseline  -Orthopedic surgery following, status post open  reduction internal fixation of the right distal femur fracture on 05/12/2023 -PT OT following recommending SNF given ongoing ambulatory dysfunction and stable from baseline   SIRS criteria without sepsis -Patient meets SIRS criteria in the setting of leukocytosis and tachycardia likely reactive in the setting of pain and fracture as above -No signs or symptoms of infection at this time   Acute anemia, unspecified, improved -Patient is without bleeding, anemia is noted prior to procedure -Likely partially hemodilutional given fluids preoperatively in setting of n.p.o. status -Prior anemia panel remarkable for iron deficiency complicated by folate deficiency (B12 elevated) -Continue to advance diet as tolerated, discussed increasing iron intake as well as multivitamin -Hemoglobin currently within normal limits   Subacute to chronic bilateral lower extremity edema -Patient reports new lower extremity edema bilaterally over the past 2 months - Repeat echo EF 65-70% without overt dysfunction - Bilateral lower extremity DVT study in 2022 negative for DVT, unclear if edema is as acute as family indicates given this testing nearly 3 years ago. Continues on PRN furosemide which is certainly reasonable - may benefit from vascular evaluation in the near future if this continues to affect ambulation/quality of life   Dementia (HCC) -Stable.  Lives at home.  Brother is primary caretaker -Resume Effexor.   Restless leg syndrome Continue ropinirole.   Benign essential HTN Stable.  Resume home diltiazem, tamsulosin  Discharge Instructions   Allergies as of 05/14/2023       Reactions   Erythromycin Itching        Medication List     STOP taking these medications    sulfamethoxazole-trimethoprim 800-160 MG tablet Commonly known as: BACTRIM DS       TAKE these medications    acetaminophen 500 MG tablet Commonly known as:  TYLENOL Take 1,000 mg by mouth every 6 (six) hours as needed for  mild pain or moderate pain.   apixaban 2.5 MG Tabs tablet Commonly known as: Eliquis Take 1 tablet (2.5 mg total) by mouth 2 (two) times daily.   ascorbic acid 500 MG tablet Commonly known as: VITAMIN C Take 500 mg by mouth daily.   cyanocobalamin 1000 MCG tablet Commonly known as: VITAMIN B12 Take 1,000 mcg by mouth in the morning and at bedtime.   Dilt-XR 120 MG 24 hr capsule Generic drug: diltiazem Take 120 mg by mouth daily.   docusate sodium 100 MG capsule Commonly known as: COLACE Take 1 capsule (100 mg total) by mouth in the morning, at noon, and at bedtime. What changed:  how much to take when to take this   ferrous sulfate 325 (65 FE) MG tablet Take 1 tablet (325 mg total) by mouth daily at 8 pm.   folic acid 1 MG tablet Commonly known as: FOLVITE Take 1 tablet (1 mg total) by mouth daily.   GEMTESA PO Take 50 mg by mouth at bedtime.   lidocaine 5 % Commonly known as: LIDODERM Place 1 patch onto the skin daily as needed (pain).   methocarbamol 500 MG tablet Commonly known as: ROBAXIN Take 1 tablet (500 mg total) by mouth every 6 (six) hours as needed for muscle spasms.   oxyCODONE 15 MG immediate release tablet Commonly known as: ROXICODONE Take 1 tablet (15 mg total) by mouth every 4 (four) hours as needed for moderate pain (pain score 4-6) or severe pain (pain score 7-10). What changed:  when to take this reasons to take this   polyethylene glycol 17 g packet Commonly known as: MIRALAX / GLYCOLAX Take 17 g by mouth 2 (two) times daily. What changed:  when to take this reasons to take this   solifenacin 5 MG tablet Commonly known as: VESICARE Take 5 mg by mouth daily.   tamsulosin 0.4 MG Caps capsule Commonly known as: FLOMAX Take 1 capsule (0.4 mg total) by mouth every evening. What changed: when to take this   venlafaxine XR 75 MG 24 hr capsule Commonly known as: EFFEXOR-XR Take 1 capsule (75 mg total) by mouth in the morning and at  bedtime.   Vitamin D 50 MCG (2000 UT) Caps Take 2,000 Units by mouth daily.        Allergies  Allergen Reactions   Erythromycin Itching    Consultations: Orthopedic surgery  Procedures/Studies: ECHOCARDIOGRAM COMPLETE Result Date: 05/12/2023    ECHOCARDIOGRAM REPORT   Patient Name:   TORIAN QUINTERO Date of Exam: 05/12/2023 Medical Rec #:  [id number]     Height:       68.0 in Accession #:    [id number]    Weight:       180.0 lb Date of Birth:  [birth year]     BSA:          1.954 m Patient Age:    73 years      BP:           127/81 mmHg Patient Gender: M             HR:           86 bpm. Exam Location:  Inpatient Procedure: 2D Echo, Cardiac Doppler and Color Doppler (Both Spectral and Color            Flow Doppler were utilized during procedure). Indications:    Pedal edema  History:  Patient has no prior history of Echocardiogram examinations.                 Abnormal ECG, Signs/Symptoms:Altered Mental Status; Risk                 Factors:Hypertension and Dyslipidemia.  Sonographer:    Vern Claude Referring Phys: [id number] Heloise Beecham EMOKPAE IMPRESSIONS  1. Left ventricular ejection fraction, by estimation, is 65 to 70%. The left ventricle has normal function. The left ventricle has no regional wall motion abnormalities. Left ventricular diastolic parameters were normal.  2. Right ventricular systolic function is normal. The right ventricular size is normal.  3. The mitral valve is normal in structure. Trivial mitral valve regurgitation. No evidence of mitral stenosis.  4. The aortic valve was not well visualized. Aortic valve regurgitation is not visualized. No aortic stenosis is present. FINDINGS  Left Ventricle: Left ventricular ejection fraction, by estimation, is 65 to 70%. The left ventricle has normal function. The left ventricle has no regional wall motion abnormalities. The left ventricular internal cavity size was normal in size. There is  no left ventricular hypertrophy. Left ventricular  diastolic parameters were normal. Right Ventricle: The right ventricular size is normal. No increase in right ventricular wall thickness. Right ventricular systolic function is normal. The tricuspid regurgitant velocity is 1.35 m/s, and with an assumed right atrial pressure of 8 mmHg, the estimated right ventricular systolic pressure is 15.3 mmHg. Left Atrium: Left atrial size was normal in size. Right Atrium: Right atrial size was normal in size. Pericardium: There is no evidence of pericardial effusion. Mitral Valve: The mitral valve is normal in structure. Trivial mitral valve regurgitation. No evidence of mitral valve stenosis. MV peak gradient, 3.2 mmHg. The mean mitral valve gradient is 2.0 mmHg. Tricuspid Valve: The tricuspid valve is normal in structure. Tricuspid valve regurgitation is trivial. Aortic Valve: The aortic valve was not well visualized. Aortic valve regurgitation is not visualized. No aortic stenosis is present. Aortic valve mean gradient measures 5.0 mmHg. Aortic valve peak gradient measures 11.0 mmHg. Aortic valve area, by VTI measures 2.76 cm. Pulmonic Valve: The pulmonic valve was not well visualized. Pulmonic valve regurgitation is not visualized. Aorta: The aortic root and ascending aorta are structurally normal, with no evidence of dilitation. IAS/Shunts: The interatrial septum was not well visualized.  LEFT VENTRICLE PLAX 2D LVIDd:         4.10 cm      Diastology LVIDs:         2.60 cm      LV e' medial:    12.43 cm/s LV PW:         0.90 cm      LV E/e' medial:  8.6 LV IVS:        0.60 cm      LV e' lateral:   10.20 cm/s LVOT diam:     1.90 cm      LV E/e' lateral: 10.5 LV SV:         76 LV SV Index:   39 LVOT Area:     2.84 cm  LV Volumes (MOD) LV vol d, MOD A2C: 145.0 ml LV vol d, MOD A4C: 157.0 ml LV vol s, MOD A2C: 30.7 ml LV vol s, MOD A4C: 51.5 ml LV SV MOD A2C:     114.3 ml LV SV MOD A4C:     157.0 ml LV SV MOD BP:      110.9 ml RIGHT VENTRICLE  IVC RV Basal diam:  3.30  cm  IVC diam: 2.10 cm RV Mid diam:    2.60 cm TAPSE (M-mode): 2.1 cm LEFT ATRIUM             Index        RIGHT ATRIUM           Index LA diam:        2.90 cm 1.48 cm/m   RA Area:     10.30 cm LA Vol (A2C):   33.8 ml 17.30 ml/m  RA Volume:   18.80 ml  9.62 ml/m LA Vol (A4C):   27.7 ml 14.17 ml/m LA Biplane Vol: 30.8 ml 15.76 ml/m  AORTIC VALVE                     PULMONIC VALVE AV Area (Vmax):    2.29 cm      PV Vmax:       1.31 m/s AV Area (Vmean):   2.26 cm      PV Peak grad:  6.8 mmHg AV Area (VTI):     2.76 cm AV Vmax:           166.00 cm/s AV Vmean:          107.000 cm/s AV VTI:            0.274 m AV Peak Grad:      11.0 mmHg AV Mean Grad:      5.0 mmHg LVOT Vmax:         134.00 cm/s LVOT Vmean:        85.400 cm/s LVOT VTI:          0.267 m LVOT/AV VTI ratio: 0.97  AORTA Ao Root diam: 3.50 cm Ao Asc diam:  3.10 cm MITRAL VALVE                TRICUSPID VALVE MV Area (PHT): 5.79 cm     TR Peak grad:   7.3 mmHg MV Area VTI:   4.10 cm     TR Vmax:        135.00 cm/s MV Peak grad:  3.2 mmHg MV Mean grad:  2.0 mmHg     SHUNTS MV Vmax:       0.89 m/s     Systemic VTI:  0.27 m MV Vmean:      70.5 cm/s    Systemic Diam: 1.90 cm MV Decel Time: 131 msec MV E velocity: 107.00 cm/s MV A velocity: 88.80 cm/s MV E/A ratio:  1.20 Epifanio Lesches MD Electronically signed by Epifanio Lesches MD Signature Date/Time: 05/12/2023/6:40:51 PM    Final    DG Knee Right Port Result Date: 05/12/2023 CLINICAL DATA:  Distal right femoral fracture. EXAM: PORTABLE RIGHT KNEE - 1-2 VIEW COMPARISON:  May 11, 2023. FINDINGS: Status post surgical internal fixation of distal right femoral fracture. Improved alignment of fracture components is noted. IMPRESSION: Status post surgical internal fixation of distal right femoral fracture. Electronically Signed   By: Lupita Raider M.D.   On: 05/12/2023 14:26   DG FEMUR, MIN 2 VIEWS RIGHT Result Date: 05/12/2023 CLINICAL DATA:  Open reduction internal fixation of right distal  femur fracture. EXAM: RIGHT FEMUR 2 VIEWS; DG C-ARM 1-60 MIN-NO REPORT Radiation exposure index: 2.88 mGy. COMPARISON:  May 11, 2023. FINDINGS: Six intraoperative fluoroscopic images were obtained of the right femur. These demonstrate surgical internal fixation of distal right femoral fracture. IMPRESSION: Fluoroscopic guidance provided during surgical internal fixation  of distal right femoral fracture. Electronically Signed   By: Lupita Raider M.D.   On: 05/12/2023 14:25   DG C-Arm 1-60 Min-No Report Result Date: 05/12/2023 CLINICAL DATA:  Open reduction internal fixation of right distal femur fracture. EXAM: RIGHT FEMUR 2 VIEWS; DG C-ARM 1-60 MIN-NO REPORT Radiation exposure index: 2.88 mGy. COMPARISON:  May 11, 2023. FINDINGS: Six intraoperative fluoroscopic images were obtained of the right femur. These demonstrate surgical internal fixation of distal right femoral fracture. IMPRESSION: Fluoroscopic guidance provided during surgical internal fixation of distal right femoral fracture. Electronically Signed   By: Lupita Raider M.D.   On: 05/12/2023 14:25   DG Knee 2 Views Right Result Date: 05/11/2023 CLINICAL DATA:  Fall and right lower extremity pain. EXAM: DG HIP (WITH OR WITHOUT PELVIS) 2-3V RIGHT; RIGHT FEMUR 2 VIEWS; RIGHT KNEE - 1-2 VIEW COMPARISON:  None Available. FINDINGS: Evaluation is limited due to osteopenia and body habitus. There is a comminuted, oblique fracture of the distal right femoral diaphysis with approximately 2 cm medial and posterior displacement of the distal fracture fragment. No dislocation. Mild arthritic changes of the knee. The soft tissues are unremarkable. IMPRESSION: Comminuted, displaced fracture of the distal right femoral diaphysis. Electronically Signed   By: Elgie Collard M.D.   On: 05/11/2023 15:36   DG Hip Unilat W or Wo Pelvis 2-3 Views Right Result Date: 05/11/2023 CLINICAL DATA:  Fall and right lower extremity pain. EXAM: DG HIP (WITH OR WITHOUT PELVIS)  2-3V RIGHT; RIGHT FEMUR 2 VIEWS; RIGHT KNEE - 1-2 VIEW COMPARISON:  None Available. FINDINGS: Evaluation is limited due to osteopenia and body habitus. There is a comminuted, oblique fracture of the distal right femoral diaphysis with approximately 2 cm medial and posterior displacement of the distal fracture fragment. No dislocation. Mild arthritic changes of the knee. The soft tissues are unremarkable. IMPRESSION: Comminuted, displaced fracture of the distal right femoral diaphysis. Electronically Signed   By: Elgie Collard M.D.   On: 05/11/2023 15:36   DG Femur Min 2 Views Right Result Date: 05/11/2023 CLINICAL DATA:  Fall and right lower extremity pain. EXAM: DG HIP (WITH OR WITHOUT PELVIS) 2-3V RIGHT; RIGHT FEMUR 2 VIEWS; RIGHT KNEE - 1-2 VIEW COMPARISON:  None Available. FINDINGS: Evaluation is limited due to osteopenia and body habitus. There is a comminuted, oblique fracture of the distal right femoral diaphysis with approximately 2 cm medial and posterior displacement of the distal fracture fragment. No dislocation. Mild arthritic changes of the knee. The soft tissues are unremarkable. IMPRESSION: Comminuted, displaced fracture of the distal right femoral diaphysis. Electronically Signed   By: Elgie Collard M.D.   On: 05/11/2023 15:36   DG Chest 2 View Result Date: 05/11/2023 CLINICAL DATA:  fall EXAM: CHEST - 2 VIEW COMPARISON:  04/19/2023 FINDINGS: Improving linear opacities at the left base. Mild atelectasis at the right lung base. Heart size and mediastinal contours are within normal limits. Blunting of posterior costophrenic angles suggesting small effusions, new since previous. Visualized bones unremarkable. IMPRESSION: 1. Small bilateral pleural effusions. 2. Improving left basilar atelectasis. Electronically Signed   By: Corlis Leak M.D.   On: 05/11/2023 15:35   DG Knee 2 Views Left Result Date: 05/11/2023 CLINICAL DATA:  Status post fall with knee pain EXAM: LEFT KNEE - 2 VIEW  COMPARISON:  None Available. FINDINGS: Diffuse osteopenia. There are no findings of fracture or dislocation. No joint effusion. Mild tricompartmental degenerative changes of the knee. Diffuse subcutaneous soft tissue reticulations. IMPRESSION: 1. No  acute displaced fracture or dislocation. 2. Mild tricompartmental degenerative changes of the knee. Electronically Signed   By: Agustin Cree M.D.   On: 05/11/2023 15:34   DG C-Arm 1-60 Min-No Report Result Date: 04/30/2023 Fluoroscopy was utilized by the requesting physician.  No radiographic interpretation.   NM Hepatobiliary Liver Func Result Date: 04/29/2023 CLINICAL DATA:  Cholelithiasis EXAM: NUCLEAR MEDICINE HEPATOBILIARY IMAGING TECHNIQUE: Sequential images of the abdomen were obtained out to 60 minutes following intravenous administration of radiopharmaceutical. RADIOPHARMACEUTICALS:  5.2 fall mCi Tc-83m  Choletec IV COMPARISON:  Ultrasound 04/27/2023.  CT 04/22/2023 FINDINGS: Prompt uptake and biliary excretion of activity by the liver is seen. Gallbladder activity is visualized, consistent with patency of cystic duct. Biliary activity passes into small bowel, consistent with patent common bile duct. IMPRESSION: No common duct or cystic duct obstruction Electronically Signed   By: Karen Kays M.D.   On: 04/29/2023 15:39   US Abdomen Limited RUQ (LIVER/GB) Result Date: 04/27/2023 CLINICAL DATA:  Elevated liver function tests EXAM: ULTRASOUND ABDOMEN LIMITED RIGHT UPPER QUADRANT COMPARISON:  Noncontrast CT 04/22/2023. FINDINGS: Gallbladder: Distended gallbladder. Sludge and stones are identified. Also some slight wall thickening of 4 mm. No reported sonographic Murphy's sign. Common bile duct: Diameter: 4 mm Liver: No focal lesion identified. Within normal limits in parenchymal echogenicity. Portal vein is patent on color Doppler imaging with normal direction of blood flow towards the liver. Other: Small right pleural effusion. IMPRESSION: Distended  gallbladder with sludge and stones. Slight wall thickening as well but no ductal dilatation or adjacent fluid. Please correlate with clinical presentation of acute cholecystitis and if needed further workup as clinically appropriate such as HIDA scan. Small right pleural effusion. Electronically Signed   By: Karen Kays M.D.   On: 04/27/2023 12:05   DG Abd Portable 1V-Small Bowel Obstruction Protocol-initial, 8 hr delay Result Date: 04/26/2023 CLINICAL DATA:  Small-bowel obstruction.  8 hour delayed image. EXAM: PORTABLE ABDOMEN - 1 VIEW COMPARISON:  Radiographs 04/24/2023 and 04/23/2023.  CT 04/22/2023. FINDINGS: 0518 hours. Two views are submitted. There is no prior imaging immediately following enteric contrast administration for comparison. Tip of the enteric tube overlies the right upper quadrant of the abdomen, likely in the distal stomach. Double-J right ureteral stent and bladder catheter remain in place. A small amount of enteric contrast is present within the decompressed stomach. There is dilute contrast within multiple mildly dilated loops of small bowel. Some contrast has extended into the ascending and descending colon. No definite extravasated enteric contrast identified. No acute osseous findings are seen status post lower lumbar fusion. IMPRESSION: Dilute contrast within multiple mildly dilated loops of small bowel with some contrast extending into the ascending and descending colon. Findings may reflect a partial small bowel obstruction or ileus. No high-grade obstruction or definite extravasated enteric contrast identified. Electronically Signed   By: Carey Bullocks M.D.   On: 04/26/2023 09:22   DG Abd 1 View Result Date: 04/24/2023 CLINICAL DATA:  [id number] Small bowel obstruction (HCC) [id number] EXAM: ABDOMEN - 1 VIEW COMPARISON:  04/23/2023 FINDINGS: Enteric tube is position within the stomach. Tube appears kinked upon itself. Dilute contrast within dilated loops of small bowel. No definite  contrast within the colon. No gross free intraperitoneal air on supine imaging. Right sided nephroureteral stent. IMPRESSION: 1. Dilute contrast within dilated loops of small bowel. No definite contrast within the colon. 2. Enteric tube is positioned within the stomach. Tube appears kinked upon itself. Electronically Signed   By: Duanne Guess D.O.  On: 04/24/2023 14:02   DG Abd Portable 1V-Small Bowel Obstruction Protocol-initial, 8 hr delay Result Date: 04/23/2023 CLINICAL DATA:  8 hour small-bowel delay EXAM: PORTABLE ABDOMEN - 1 VIEW COMPARISON:  04/23/2023, CT 04/22/2023 FINDINGS: Enteric tube tip overlies the duodenal bulb region. Enteral contrast collection at the gastric fundus. Persistent small bowel distension. Dilute contrast within dilated small bowel in the left lower quadrant. No colon contrast. Right-sided ureteral stent. IMPRESSION: Persistent small bowel distension with contrast in the gastric fundus and dilute contrast within dilated small bowel in the left lower quadrant. No colon contrast. Electronically Signed   By: Jasmine Pang M.D.   On: 04/23/2023 23:57   DG Abd 1 View Result Date: 04/23/2023 CLINICAL DATA:  Small bowel obstruction EXAM: ABDOMEN - 1 VIEW COMPARISON:  Earlier today FINDINGS: Tip and side port of the enteric tube below the diaphragm in the stomach. Gaseous central small bowel dilatation has air-fluid levels on this upright view. Right ureteral stent remains in place. IMPRESSION: 1. Gaseous central small bowel dilatation with air-fluid levels on this upright view. This may represent ileus or obstruction 2. Tip and side port of the enteric tube below the diaphragm in the stomach. Electronically Signed   By: Narda Rutherford M.D.   On: 04/23/2023 14:51   DG Abd Portable 1V-Small Bowel Protocol-Position Verification Result Date: 04/23/2023 CLINICAL DATA:  Nasogastric tube placement. EXAM: PORTABLE ABDOMEN - 1 VIEW COMPARISON:  Radiograph earlier today. FINDINGS: Tip  and side port of the enteric tube below the diaphragm in the stomach. Moderate diffuse gaseous small bowel distension centrally. Moderate volume of stool in the colon. Right ureteral stent in place. Catheter projects over the pelvis. IMPRESSION: 1. Tip and side port of the enteric tube below the diaphragm in the stomach. 2. Moderate diffuse gaseous small bowel distension centrally, may represent ileus or obstruction. This is similar to exam earlier today. Electronically Signed   By: Narda Rutherford M.D.   On: 04/23/2023 14:50   DG Abd Portable 1V Result Date: 04/23/2023 CLINICAL DATA:  Ileus. EXAM: PORTABLE ABDOMEN - 1 VIEW COMPARISON:  January 17, 2022. FINDINGS: Right-sided ureteral stent is noted in grossly good position. Mildly dilated small bowel loops are noted. No definite colonic dilatation is noted. No definite contrast is noted in the colon. IMPRESSION: Mildly dilated small bowel loops are noted concerning for ileus or possibly distal small bowel obstruction. Electronically Signed   By: Lupita Raider M.D.   On: 04/23/2023 09:31   CT ABDOMEN PELVIS WO CONTRAST Result Date: 04/22/2023 CLINICAL DATA:  Abdominal pain. EXAM: CT ABDOMEN AND PELVIS WITHOUT CONTRAST TECHNIQUE: Multidetector CT imaging of the abdomen and pelvis was performed following the standard protocol without IV contrast. RADIATION DOSE REDUCTION: This exam was performed according to the departmental dose-optimization program which includes automated exposure control, adjustment of the mA and/or kV according to patient size and/or use of iterative reconstruction technique. COMPARISON:  CT abdomen pelvis dated 04/19/2023. FINDINGS: Evaluation of this exam is limited due to respiratory motion as well as in the absence of intravenous contrast. Lower chest: Partially visualized small right pleural effusion and partial compressive atelectasis of the right lower lobe. Pneumonia is not excluded. Minimal left lung base linear atelectasis.  There is coronary vascular calcification. No intra-abdominal free air or free fluid. Hepatobiliary: The liver is unremarkable. No biliary dilatation. Layering sludge noted in the gallbladder. No pericholecystic fluid. No calcified gallstone. Pancreas: Unremarkable. No pancreatic ductal dilatation or surrounding inflammatory changes. Spleen: Normal in size  without focal abnormality. Adrenals/Urinary Tract: The right adrenal glands unremarkable. Indeterminate 1 cm left adrenal nodule. Small left renal interpolar cyst. There is a 5 mm nonobstructing left renal inferior pole calculus. There has been interval placement of a right pigtail ureteral stent with proximal tip in the right renal collecting system and distal end in the urinary bladder. Near complete resolution of the previously seen right hydronephrosis. Multiple nonobstructing right renal inferior pole calculi with combined length of approximately 1 cm. There has been interval development of moderate amount of high attenuating content within the large right renal cyst consistent with hemorrhage. Small pockets of air also noted within this collection there is right perinephric stranding and possible small hematoma. Relatively similar location of a 9 mm distal right ureteral calculus or the pelvic brim. The additional nonobstructing smaller stone in the distal right ureter is no longer visualized and likely past into the bladder. The urinary bladder is minimally distended. Similar appearance of a cluster of small stones along the posterior bladder wall. A Foley catheter noted in the bladder. Tiny pocket of air within the urinary bladder, likely introduced via catheter. Stomach/Bowel: Postsurgical changes of bowel with anastomotic staple line in the lower abdomen. Mildly dilated small bowel loops measure up to 3.3 cm in caliber. The terminal ileum and distal small bowel in the right lower quadrant are collapsed. Although a transition appears to be present in the  right lower quadrant, findings may represent an ileus or developing obstruction. Follow-up with small-bowel series recommended to document passage of contrast into the colon. Vascular/Lymphatic: Moderate aortoiliac atherosclerotic disease. The IVC is unremarkable. No portal venous gas. There is no adenopathy. Reproductive: Enlarged prostate gland with median lobe hypertrophy indenting with bladder. Other: Subcutaneous edema of the right lateral abdominal wall. Musculoskeletal: Osteopenia with degenerative changes of the spine. Lower lumbar posterior fusion. No acute osseous pathology. IMPRESSION: 1. Interval placement of a right pigtail ureteral stent with near complete resolution of the previously seen right hydronephrosis. 2. Interval development of moderate amount of hemorrhage within the large right renal cyst. 3. Relatively similar location of a 9 mm distal right ureteral calculus or the pelvic brim. 4. Ileus versus developing small-bowel obstruction. Follow-up with small-bowel series recommended. Electronically Signed   By: Elgie Collard M.D.   On: 04/22/2023 14:14   DG C-Arm 1-60 Min-No Report Result Date: 04/20/2023 Fluoroscopy was utilized by the requesting physician.  No radiographic interpretation.   CT ABDOMEN PELVIS W CONTRAST Result Date: 04/19/2023 CLINICAL DATA:  Fall with low back pain green urine in bag EXAM: CT ABDOMEN AND PELVIS WITH CONTRAST TECHNIQUE: Multidetector CT imaging of the abdomen and pelvis was performed using the standard protocol following bolus administration of intravenous contrast. RADIATION DOSE REDUCTION: This exam was performed according to the departmental dose-optimization program which includes automated exposure control, adjustment of the mA and/or kV according to patient size and/or use of iterative reconstruction technique. CONTRAST:  80mL OMNIPAQUE IOHEXOL 300 MG/ML  SOLN COMPARISON:  CT 11/03/2022 FINDINGS: Lower chest: Lung bases demonstrate atelectasis and  or scarring at the lung bases. No acute airspace disease Hepatobiliary: Distended gallbladder. No calcified stone or biliary dilatation Pancreas: Atrophic.  No inflammation Spleen: Normal in size without focal abnormality. Adrenals/Urinary Tract: Adrenal glands are normal. Bilateral renal cysts, on the right measures up to 14 cm, no specific imaging follow-up is recommended. Bilateral kidney stones, on the left measuring up to 3 mm and on the right measuring up to 4 mm. New moderate right hydronephrosis  and hydroureter, secondary to a 9 mm stone in the distal right ureter at the level of L5-S1. There is an additional stone within the ureter distal to the obstructing stone, this measures 6 mm and is visual about 2 cm proximal to the left UVJ. There is perinephric fluid and stranding on the right in addition to mild urothelial enhancement. Delayed excretion of contrast from right kidney consistent with obstruction. Bladder is decompressed by Foley catheter. Diffuse bladder wall thickening with perivesical stranding. Small calcifications within the bladder probably due to stones Stomach/Bowel: Stomach nonenlarged. No dilated small bowel. No acute bowel wall thickening. Vascular/Lymphatic: Aortic atherosclerosis. No enlarged abdominal or pelvic lymph nodes. Reproductive: Enlarged prostate Other: Negative for pelvic effusion or free air Musculoskeletal: See separately dictated lumbar spine CT report. No acute osseous findings could in the pelvis. IMPRESSION: 1. Moderate right hydronephrosis and hydroureter, secondary to a 9 mm stone in the distal right ureter at the level of L5-S1. There is an additional 6 mm stone within the ureter distal to the obstructing stone. There is perinephric fluid and stranding on the right in addition to mild urothelial enhancement raising possibility of superimposed infection. 2. Bilateral kidney stones. Bladder is decompressed by Foley catheter. Diffuse bladder wall thickening with  perivesical stranding, question cystitis. Small calcifications within the bladder probably due to stones. 3. Enlarged prostate. 4. Aortic atherosclerosis. Electronically Signed   By: Jasmine Pang M.D.   On: 04/19/2023 20:17   CT L-SPINE NO CHARGE Result Date: 04/19/2023 CLINICAL DATA:  Back pain, fall 2 days ago. EXAM: CT LUMBAR SPINE WITHOUT CONTRAST TECHNIQUE: Multidetector CT imaging of the lumbar spine was performed without intravenous contrast administration. Multiplanar CT image reconstructions were also generated. RADIATION DOSE REDUCTION: This exam was performed according to the departmental dose-optimization program which includes automated exposure control, adjustment of the mA and/or kV according to patient size and/or use of iterative reconstruction technique. COMPARISON:  MRI lumbar spine 03/22/2019, CT lumbar spine 10/24/2018 FINDINGS: Segmentation: 5 lumbar type vertebrae. Alignment: Lumbar lordosis is maintained.  No listhesis. Vertebrae: Diffuse osteopenia. Similar irregularity of the T12 superior endplate with moderate height loss centrally. Additional irregularities of the L1 superior and inferior endplates with mild height loss anteriorly is similar to prior. No additional compression deformities or evidence of displaced fracture on the current study. Posterior instrumented fusion from L4-S1 with bilateral pedicle screws and vertical interlocking rods. Hardware is intact. Mature osseous fusion noted from L4-S1. Paraspinal and other soft tissues: The paraspinal musculature is unremarkable. Large partially visualized cystic lesion involving the right kidney better evaluated on same day CT abdomen pelvis. Additional partially visualized left renal cyst. Multiple calculi noted in the right kidney with prominence of the right renal collecting system. Atherosclerosis of the abdominal aorta and branch vessels. Degenerative changes of the bilateral sacroiliac joints. Disc levels: Intervertebral disc  spaces are maintained. There is a disc bulge at L1-2 along the posterior osteophytes more pronounced along the left without significant osseous spinal canal stenosis. Additional disc bulge and facet arthrosis at L2-3 resulting in mild spinal canal stenosis. Disc bulge and facet arthrosis at L3-4 resulting in mild spinal canal stenosis. Disc bulge and facet osteophytes contributing to mild right and mild-to-moderate left foraminal stenosis at L3-4. No definite spinal canal narrowing at L4-5 or L5-S1. IMPRESSION: 1. No acute fracture or traumatic malalignment of the lumbar spine. 2. Similar chronic compression deformities of T12 and L1. No retropulsion. 3. Posterior instrumented fusion from L4-S1 without evidence of hardware complication. 4.  Multilevel degenerative changes of the lumbar spine as described. Electronically Signed   By: Emily Filbert M.D.   On: 04/19/2023 20:11   CT Head Wo Contrast Result Date: 04/19/2023 CLINICAL DATA:  Altered mental status EXAM: CT HEAD WITHOUT CONTRAST TECHNIQUE: Contiguous axial images were obtained from the base of the skull through the vertex without intravenous contrast. RADIATION DOSE REDUCTION: This exam was performed according to the departmental dose-optimization program which includes automated exposure control, adjustment of the mA and/or kV according to patient size and/or use of iterative reconstruction technique. COMPARISON:  04/29/2020 FINDINGS: Brain: There is no mass, hemorrhage or extra-axial collection. There is generalized atrophy without lobar predilection. Hypodensity of the white matter is most commonly associated with chronic microvascular disease. Vascular: No hyperdense vessel or unexpected vascular calcification. Skull: The visualized skull base, calvarium and extracranial soft tissues are normal. Sinuses/Orbits: Partial sphenoid sinus opacification. Normal orbits. Other: None. IMPRESSION: 1. No acute intracranial abnormality. 2. Generalized atrophy and  findings of chronic microvascular disease. Electronically Signed   By: Deatra Robinson M.D.   On: 04/19/2023 20:03   DG Chest 2 View Result Date: 04/19/2023 CLINICAL DATA:  Sepsis.  Back pain. EXAM: CHEST - 2 VIEW COMPARISON:  11/03/2022 FINDINGS: Two views of the chest demonstrate low lung volumes. Linear densities in left lower lung are suggestive for atelectasis. Linear densities in the right lung may also represent atelectasis. Heart size is normal. No large pleural effusions. Negative for a pneumothorax. IMPRESSION: Low lung volumes with bilateral atelectasis. Electronically Signed   By: Richarda Overlie M.D.   On: 04/19/2023 18:05     Subjective: No acute issues or events overnight, tolerating breakfast well this morning denies nausea vomiting diarrhea constipation headache fevers chills or chest pain.  Leg pain and stiffness ongoing but markedly improved from prior.   Discharge Exam: Vitals:   05/14/23 0442 05/14/23 0752  BP: 129/73 121/70  Pulse: 71 75  Resp: 20 16  Temp: 98 F (36.7 C) 98.1 F (36.7 C)  SpO2: 99% 100%   Vitals:   05/13/23 1608 05/13/23 1928 05/14/23 0442 05/14/23 0752  BP: 120/63 117/62 129/73 121/70  Pulse: 72 76 71 75  Resp: 18 18 20 16   Temp: 98.3 F (36.8 C) 97.9 F (36.6 C) 98 F (36.7 C) 98.1 F (36.7 C)  TempSrc:  Oral Oral Oral  SpO2: 100% 99% 99% 100%  Weight:      Height:        General: Pt is alert, awake, not in acute distress Cardiovascular: RRR, S1/S2 +, no rubs, no gallops Respiratory: CTA bilaterally, no wheezing, no rhonchi Abdominal: Soft, NT, ND, bowel sounds + Extremities: no edema, no cyanosis    The results of significant diagnostics from this hospitalization (including imaging, microbiology, ancillary and laboratory) are listed below for reference.     Microbiology: Recent Results (from the past 240 hours)  Surgical pcr screen     Status: None   Collection Time: 05/12/23 12:40 AM   Specimen: Nasal Mucosa; Nasal Swab  Result  Value Ref Range Status   MRSA, PCR NEGATIVE NEGATIVE Final   Staphylococcus aureus NEGATIVE NEGATIVE Final    Comment: (NOTE) The Xpert SA Assay (FDA approved for NASAL specimens in patients 72 years of age and older), is one component of a comprehensive surveillance program. It is not intended to diagnose infection nor to guide or monitor treatment. Performed at [facility], [address] [place], [place] [address]  Labs: BNP (last 3 results) Recent Labs    05/11/23 1406  BNP 91.0   Basic Metabolic Panel: Recent Labs  Lab 05/11/23 1406 05/12/23 0606 05/13/23 0914  NA 134* 137 135  K 3.6 4.0 4.2  CL 96* 102 101  CO2 21* 22 20*  GLUCOSE 98 100* 127*  BUN 26* 20 25*  CREATININE 1.22 0.94 1.00  CALCIUM 8.6* 8.8* 8.7*   Liver Function Tests: Recent Labs  Lab 05/11/23 1406  AST 40  ALT 43  ALKPHOS 111  BILITOT 0.6  PROT 7.2  ALBUMIN 2.0*   No results for input(s): "LIPASE", "AMYLASE" in the last 168 hours. No results for input(s): "AMMONIA" in the last 168 hours. CBC: Recent Labs  Lab 05/11/23 1406 05/12/23 0606 05/13/23 0914  WBC 16.7* 18.7* 14.2*  HGB 7.6* 6.8* 8.7*  HCT 24.5* 21.3* 26.0*  MCV 97.6 94.2 88.7  PLT 382 427* 376   Urinalysis    Component Value Date/Time   COLORURINE AMBER (A) 04/19/2023 1623   APPEARANCEUR CLOUDY (A) 04/19/2023 1623   LABSPEC 1.026 04/19/2023 1623   PHURINE 5.0 04/19/2023 1623   GLUCOSEU NEGATIVE 04/19/2023 1623   HGBUR LARGE (A) 04/19/2023 1623   BILIRUBINUR NEGATIVE 04/19/2023 1623   KETONESUR NEGATIVE 04/19/2023 1623   PROTEINUR 100 (A) 04/19/2023 1623   UROBILINOGEN 1.0 08/16/2009 1138   NITRITE NEGATIVE 04/19/2023 1623   LEUKOCYTESUR MODERATE (A) 04/19/2023 1623   Sepsis Labs Recent Labs  Lab 05/11/23 1406 05/12/23 0606 05/13/23 0914  WBC 16.7* 18.7* 14.2*   Microbiology Recent Results (from the past 240 hours)  Surgical pcr screen     Status: None   Collection Time: 05/12/23 12:40 AM    Specimen: Nasal Mucosa; Nasal Swab  Result Value Ref Range Status   MRSA, PCR NEGATIVE NEGATIVE Final   Staphylococcus aureus NEGATIVE NEGATIVE Final    Comment: (NOTE) The Xpert SA Assay (FDA approved for NASAL specimens in patients 81 years of age and older), is one component of a comprehensive surveillance program. It is not intended to diagnose infection nor to guide or monitor treatment. Performed at [facility], [address] [place], [place] [address]      Time coordinating discharge: Over 30 minutes  SIGNED:   Azucena Fallen, DO [facility] 05/14/2023, 10:04 AM Pager   If 7PM-7AM, please contact night-coverage [email or web address]

## 2023-05-14 NOTE — Progress Notes (Signed)
 Patient left via [facility] to [facility] three paper prescriptions sent in packet, pt DC with chronic foley, all personal belongings taken, brother at bedside, packet taken.

## 2023-05-16 LAB — TYPE AND SCREEN
ABO/RH(D): A POS
Antibody Screen: NEGATIVE
Unit division: 0
Unit division: 0
Unit division: 0
Unit division: 0

## 2023-05-16 LAB — BPAM RBC
Blood Product Expiration Date: 202504092359
Blood Product Expiration Date: 202504292359
Blood Product Expiration Date: 202504292359
Blood Product Expiration Date: 202504302359
ISSUE DATE / TIME: 202504020823
ISSUE DATE / TIME: 202504020823
Unit Type and Rh: 6200
Unit Type and Rh: 6200
Unit Type and Rh: 6200
Unit Type and Rh: 6200

## 2023-05-18 DIAGNOSIS — M79605 Pain in left leg: Secondary | ICD-10-CM | POA: Diagnosis not present

## 2023-05-20 DIAGNOSIS — E440 Moderate protein-calorie malnutrition: Secondary | ICD-10-CM | POA: Diagnosis not present

## 2023-05-20 DIAGNOSIS — L89322 Pressure ulcer of left buttock, stage 2: Secondary | ICD-10-CM | POA: Diagnosis not present

## 2023-05-20 DIAGNOSIS — I10 Essential (primary) hypertension: Secondary | ICD-10-CM | POA: Diagnosis not present

## 2023-05-20 DIAGNOSIS — R339 Retention of urine, unspecified: Secondary | ICD-10-CM | POA: Diagnosis not present

## 2023-05-20 DIAGNOSIS — G8911 Acute pain due to trauma: Secondary | ICD-10-CM | POA: Diagnosis not present

## 2023-05-20 DIAGNOSIS — N139 Obstructive and reflux uropathy, unspecified: Secondary | ICD-10-CM | POA: Diagnosis not present

## 2023-05-20 DIAGNOSIS — S72302D Unspecified fracture of shaft of left femur, subsequent encounter for closed fracture with routine healing: Secondary | ICD-10-CM | POA: Diagnosis not present

## 2023-05-20 DIAGNOSIS — F0390 Unspecified dementia without behavioral disturbance: Secondary | ICD-10-CM | POA: Diagnosis not present

## 2023-05-27 DIAGNOSIS — N139 Obstructive and reflux uropathy, unspecified: Secondary | ICD-10-CM | POA: Diagnosis not present

## 2023-05-27 DIAGNOSIS — L89322 Pressure ulcer of left buttock, stage 2: Secondary | ICD-10-CM | POA: Diagnosis not present

## 2023-05-27 DIAGNOSIS — E440 Moderate protein-calorie malnutrition: Secondary | ICD-10-CM | POA: Diagnosis not present

## 2023-05-28 ENCOUNTER — Other Ambulatory Visit: Payer: Self-pay | Admitting: Internal Medicine

## 2023-05-28 MED ORDER — OXYCODONE HCL 15 MG PO TABS: 15.0000 mg | ORAL_TABLET | ORAL | 0 refills | Status: AC | PRN

## 2023-06-01 DIAGNOSIS — S72451D Displaced supracondylar fracture without intracondylar extension of lower end of right femur, subsequent encounter for closed fracture with routine healing: Secondary | ICD-10-CM | POA: Diagnosis not present

## 2023-06-03 DIAGNOSIS — F321 Major depressive disorder, single episode, moderate: Secondary | ICD-10-CM | POA: Diagnosis not present

## 2023-06-03 DIAGNOSIS — L89322 Pressure ulcer of left buttock, stage 2: Secondary | ICD-10-CM | POA: Diagnosis not present

## 2023-06-03 DIAGNOSIS — G629 Polyneuropathy, unspecified: Secondary | ICD-10-CM | POA: Diagnosis not present

## 2023-06-03 DIAGNOSIS — N139 Obstructive and reflux uropathy, unspecified: Secondary | ICD-10-CM | POA: Diagnosis not present

## 2023-06-03 DIAGNOSIS — E440 Moderate protein-calorie malnutrition: Secondary | ICD-10-CM | POA: Diagnosis not present

## 2023-06-03 DIAGNOSIS — L89310 Pressure ulcer of right buttock, unstageable: Secondary | ICD-10-CM | POA: Diagnosis not present

## 2023-06-04 DIAGNOSIS — M533 Sacrococcygeal disorders, not elsewhere classified: Secondary | ICD-10-CM | POA: Diagnosis not present

## 2023-06-04 DIAGNOSIS — T84296A Other mechanical complication of internal fixation device of vertebrae, initial encounter: Secondary | ICD-10-CM | POA: Diagnosis not present

## 2023-06-07 DIAGNOSIS — G4700 Insomnia, unspecified: Secondary | ICD-10-CM | POA: Diagnosis not present

## 2023-06-10 ENCOUNTER — Telehealth: Payer: Self-pay | Admitting: Neurosurgery

## 2023-06-10 DIAGNOSIS — E440 Moderate protein-calorie malnutrition: Secondary | ICD-10-CM | POA: Diagnosis not present

## 2023-06-10 DIAGNOSIS — D649 Anemia, unspecified: Secondary | ICD-10-CM | POA: Diagnosis not present

## 2023-06-10 DIAGNOSIS — L89310 Pressure ulcer of right buttock, unstageable: Secondary | ICD-10-CM | POA: Diagnosis not present

## 2023-06-10 DIAGNOSIS — L89313 Pressure ulcer of right buttock, stage 3: Secondary | ICD-10-CM | POA: Diagnosis not present

## 2023-06-10 NOTE — Telephone Encounter (Signed)
 Luke Foster is calling on behalf of the patient. He is constantly complaining of back pain more when he is sitting in his wheelchair. They use [facility] for mobile xrays. He had xrays recently and showed that he has sacrum screws that are loose. They want him to see our office since he had SCS removed by us .  Do we have access to [facility] on powershare?

## 2023-06-10 NOTE — Telephone Encounter (Signed)
 Brian Campanile called back. She is working on trying to get patient seen with the previous office that did his sacrum surgery. If they are not able to see him she will call us  back.

## 2023-06-10 NOTE — Telephone Encounter (Signed)
 This is the only one we have in powershare.

## 2023-06-10 NOTE — Telephone Encounter (Signed)
 Left message to call back

## 2023-06-18 DIAGNOSIS — L89313 Pressure ulcer of right buttock, stage 3: Secondary | ICD-10-CM | POA: Diagnosis not present

## 2023-06-18 DIAGNOSIS — D649 Anemia, unspecified: Secondary | ICD-10-CM | POA: Diagnosis not present

## 2023-06-18 DIAGNOSIS — L89310 Pressure ulcer of right buttock, unstageable: Secondary | ICD-10-CM | POA: Diagnosis not present

## 2023-06-18 DIAGNOSIS — E440 Moderate protein-calorie malnutrition: Secondary | ICD-10-CM | POA: Diagnosis not present

## 2023-06-21 DIAGNOSIS — G4700 Insomnia, unspecified: Secondary | ICD-10-CM | POA: Diagnosis not present

## 2023-06-24 DIAGNOSIS — E440 Moderate protein-calorie malnutrition: Secondary | ICD-10-CM | POA: Diagnosis not present

## 2023-06-24 DIAGNOSIS — L89313 Pressure ulcer of right buttock, stage 3: Secondary | ICD-10-CM | POA: Diagnosis not present

## 2023-06-24 DIAGNOSIS — D649 Anemia, unspecified: Secondary | ICD-10-CM | POA: Diagnosis not present

## 2023-06-24 NOTE — Telephone Encounter (Signed)
 Per Brian Campanile, brother needs a Monday appt. Scheduled for 07/12/2023. Brian Campanile will tell him to drop off CD before appt or be here ealy day of appt.

## 2023-06-24 NOTE — Telephone Encounter (Signed)
 Brian Campanile called back that they were not able to get patient to see his previous surgeon because he has retired. Could we see the patient for the scrum screws? Report has been scanned. They will bring the CD with them.

## 2023-06-29 DIAGNOSIS — M51369 Other intervertebral disc degeneration, lumbar region without mention of lumbar back pain or lower extremity pain: Secondary | ICD-10-CM | POA: Diagnosis not present

## 2023-06-30 DIAGNOSIS — I82419 Acute embolism and thrombosis of unspecified femoral vein: Secondary | ICD-10-CM | POA: Diagnosis not present

## 2023-06-30 DIAGNOSIS — G629 Polyneuropathy, unspecified: Secondary | ICD-10-CM | POA: Diagnosis not present

## 2023-07-01 DIAGNOSIS — L89313 Pressure ulcer of right buttock, stage 3: Secondary | ICD-10-CM | POA: Diagnosis not present

## 2023-07-01 DIAGNOSIS — E440 Moderate protein-calorie malnutrition: Secondary | ICD-10-CM | POA: Diagnosis not present

## 2023-07-01 DIAGNOSIS — D649 Anemia, unspecified: Secondary | ICD-10-CM | POA: Diagnosis not present

## 2023-07-06 DIAGNOSIS — G4700 Insomnia, unspecified: Secondary | ICD-10-CM | POA: Diagnosis not present

## 2023-07-09 NOTE — Progress Notes (Deleted)
 Referring Physician:  No referring provider defined for this encounter.  Primary Physician:  Kathyleen Parkins, MD  History of Present Illness: 07/09/2023*** Mr. Luke Foster has a history of HTN, dementia, neuropathy, chronic pain, hyperlipidemia, RLS.   Sacrum screws are loose  Duration: *** Location: *** Quality: *** Severity: ***  Precipitating: aggravated by *** Modifying factors: made better by *** Weakness: none Timing: ***    Bowel/Bladder Dysfunction: none  Conservative measures:  Physical therapy: ***  Multimodal medical therapy including regular antiinflammatories: tylenol , lidoderm  patches, robaxin , oxycodone  Injections: *** epidural steroid injections  Past Surgery:   2006 L4-5 L5-S1 Dr. Nigel Bart SCS placed [facility] 2008 SCS removed1/20/2021 by Dr. Vernell Goldsmith has ***no symptoms of cervical myelopathy.  The symptoms are causing a significant impact on the patient's life.   Review of Systems:  A 10 point review of systems is negative, except for the pertinent positives and negatives detailed in the HPI.  Past Medical History: Past Medical History:  Diagnosis Date   Arthritis    Chronic back pain    Chronic pain 1999   Bilateral feet (R >L)   Dementia (HCC) 04/19/2023   Difficult or painful urination    High blood pressure    History of kidney stones    H/O   Nerve damage    Pain management    Paresthesia of foot, bilateral    Renal disorder    Sleep apnea    USES CPAP   Weakness of both legs     Past Surgical History: Past Surgical History:  Procedure Laterality Date   arm surgery     BACK SURGERY     X2   BOTOX  INJECTION N/A 10/06/2021   Procedure: CYSTOSCOPY LITHOLAPAXY WITH BOTOX  INJECTION;  Surgeon: Samson Croak, MD;  [facility]: [facility];  Service: Urology;  Laterality: N/A;   BOTOX  INJECTION N/A 04/20/2022   Procedure: CYSTOSCOPY & BOTOX  INJECTION, BLADDER FULGARATION;  Surgeon: Samson Croak, MD;   [facility]: [facility];  Service: Urology;  Laterality: N/A;  45 MINS   BOTOX  INJECTION N/A 12/28/2022   Procedure: CYSTOSCOPY WITH INTRA DETRUSOR BOTOX  INJECTION;  Surgeon: Samson Croak, MD;  [facility];  Service: Urology;  Laterality: N/A;  45 MINS FOR CASE   BOWEL RESECTION N/A 01/15/2022   Procedure: SMALL BOWEL RESECTION;  Surgeon: Marijo Shove, DO;  Location: [facility];  Service: General;  Laterality: N/A;   CYSTOSCOPY W/ URETERAL STENT PLACEMENT Right 11/29/2021   Procedure: CYSTOSCOPY WITH RETROGRADE PYELOGRAM/URETERAL STENT PLACEMENT;  Surgeon: Homero Luster, MD;  [facility];  Service: Urology;  Laterality: Right;   CYSTOSCOPY WITH STENT PLACEMENT Right 04/20/2023   Procedure: CYSTOSCOPY, RETROGRADE PYELOGRAM, RIGHT STENT INSERTION;  Surgeon: Mallie Seal, MD;  [facility];  Service: Urology;  Laterality: Right;   CYSTOSCOPY/URETEROSCOPY/HOLMIUM LASER/STENT PLACEMENT Right 01/05/2022   Procedure: CYSTOSCOPY RIGHT URETEROSCOPY/HOLMIUM LASER/STENT PLACEMENT basket extraction stones;  Surgeon: Samson Croak, MD;  [facility];  Service: Urology;  Laterality: Right;  1 HR FOR CASE   CYSTOSCOPY/URETEROSCOPY/HOLMIUM LASER/STENT PLACEMENT Right 04/30/2023   Procedure: CYSTOSCOPY/URETEROSCOPY/HOLMIUM LASER/STENT PLACEMENT;  Surgeon: Samson Croak, MD;  [facility];  Service: Urology;  Laterality: Right;   FACIAL COSMETIC SURGERY     HERNIA REPAIR     IR US  GUIDE BX ASP/DRAIN  10/19/2017   KIDNEY STONE SURGERY     LAPAROTOMY N/A 01/15/2022   Procedure: Exploratory laparotomy;  Surgeon: Pappayliou,  Catherine A, DO;  Location: [facility];  Service: General;  Laterality: N/A;   LUMBAR SPINAL CORD SIMULATOR LEAD REMOVAL N/A 03/01/2019   Procedure: LUMBAR SPINAL CORD SIMULATOR LEAD REMOVAL;  Surgeon: Jodeen Munch, MD;  Location: [facility];  Service: Neurosurgery;  Laterality: N/A;   MASTECTOMY Left    ORIF FEMUR FRACTURE Right 05/12/2023   Procedure: OPEN  REDUCTION INTERNAL FIXATION (ORIF) DISTAL FEMUR FRACTURE;  Surgeon: Laneta Pintos, MD;  Location: [facility];  Service: Orthopedics;  Laterality: Right;   SPINAL CORD STIMULATOR REMOVAL N/A 03/01/2019   Procedure: LUMBAR SPINAL CORD STIMULATOR REMOVAL;  Surgeon: Jodeen Munch, MD;  Location: [facility];  Service: Neurosurgery;  Laterality: N/A;   WISDOM TOOTH EXTRACTION      Allergies: Allergies as of 07/12/2023 - Review Complete 05/12/2023  Allergen Reaction Noted   Erythromycin Itching 05/22/2011    Medications: Outpatient Encounter Medications as of 07/12/2023  Medication Sig   acetaminophen  (TYLENOL ) 500 MG tablet Take 1,000 mg by mouth every 6 (six) hours as needed for mild pain or moderate pain.   apixaban  (ELIQUIS ) 2.5 MG TABS tablet Take 1 tablet (2.5 mg total) by mouth 2 (two) times daily.   ascorbic acid (VITAMIN C) 500 MG tablet Take 500 mg by mouth daily.   Cholecalciferol  (VITAMIN D ) 50 MCG (2000 UT) CAPS Take 2,000 Units by mouth daily.   cyanocobalamin  (VITAMIN B12) 1000 MCG tablet Take 1,000 mcg by mouth in the morning and at bedtime.   DILT-XR 120 MG 24 hr capsule Take 120 mg by mouth daily.   docusate sodium  (COLACE) 100 MG capsule Take 1 capsule (100 mg total) by mouth in the morning, at noon, and at bedtime. (Patient taking differently: Take 300 mg by mouth at bedtime.)   ferrous sulfate  325 (65 FE) MG tablet Take 1 tablet (325 mg total) by mouth daily at 8 pm.   folic acid  (FOLVITE ) 1 MG tablet Take 1 tablet (1 mg total) by mouth daily.   lidocaine  (LIDODERM ) 5 % Place 1 patch onto the skin daily as needed (pain).   methocarbamol  (ROBAXIN ) 500 MG tablet Take 1 tablet (500 mg total) by mouth every 6 (six) hours as needed for muscle spasms.   oxyCODONE  (ROXICODONE ) 15 MG immediate release tablet Take 1 tablet (15 mg total) by mouth every 4 (four) hours as needed for pain.   polyethylene glycol (MIRALAX  / GLYCOLAX ) 17 g packet Take 17 g by mouth 2 (two) times daily.  (Patient taking differently: Take 17 g by mouth daily as needed for mild constipation.)   solifenacin (VESICARE) 5 MG tablet Take 5 mg by mouth daily.   tamsulosin  (FLOMAX ) 0.4 MG CAPS capsule Take 1 capsule (0.4 mg total) by mouth every evening. (Patient taking differently: Take 0.4 mg by mouth at bedtime.)   venlafaxine  XR (EFFEXOR -XR) 75 MG 24 hr capsule Take 1 capsule (75 mg total) by mouth in the morning and at bedtime.   Vibegron  (GEMTESA  PO) Take 50 mg by mouth at bedtime.   No facility-administered encounter medications on file as of 07/12/2023.    Social History: Social History   Tobacco Use   Smoking status: Former    Current packs/day: 0.00    Average packs/day: 2.0 packs/day for 10.0 years (20.0 ttl pk-yrs)    Types: Cigarettes    Start date: 05/20/1981    Quit date: 05/21/1991    Years since quitting: 32.1   Smokeless tobacco: Former    Quit date: 06/08/1991  Vaping Use  Vaping status: Never Used  Substance Use Topics   Alcohol use: Yes    Comment: rare   Drug use: No    Family Medical History: Family History  Problem Relation Age of Onset   Heart disease Father        Living, 1   Breast cancer Mother        Living, 20   Hypercholesterolemia Mother    Healthy Brother    Healthy Sister     Physical Examination: There were no vitals filed for this visit.  General: Patient is well developed, well nourished, calm, collected, and in no apparent distress. Attention to examination is appropriate.  Respiratory: Patient is breathing without any difficulty.   NEUROLOGICAL:     Awake, alert, oriented to person, place, and time.  Speech is clear and fluent. Fund of knowledge is appropriate.   Cranial Nerves: Pupils equal round and reactive to light.  Facial tone is symmetric.    *** ROM of cervical spine *** pain *** posterior cervical tenderness. *** tenderness in bilateral trapezial region.   *** ROM of lumbar spine *** pain *** posterior lumbar tenderness.    No abnormal lesions on exposed skin.   Strength: Side Biceps Triceps Deltoid Interossei Grip Wrist Ext. Wrist Flex.  R 5 5 5 5 5 5 5   L 5 5 5 5 5 5 5    Side Iliopsoas Quads Hamstring PF DF EHL  R 5 5 5 5 5 5   L 5 5 5 5 5 5    Reflexes are ***2+ and symmetric at the biceps, brachioradialis, patella and achilles.   Hoffman's is absent.  Clonus is not present.   Bilateral upper and lower extremity sensation is intact to light touch.     Gait is normal.   ***No difficulty with tandem gait.    Medical Decision Making  Imaging: No recent imaging.   Assessment and Plan: Mr. Hoeg is a pleasant 73 y.o. male has ***  Treatment options discussed with patient and following plan made:   - Order for physical therapy for *** spine ***. Patient to call to schedule appointment. *** - Continue current medications including ***. Reviewed dosing and side effects.  - Prescription for ***. Reviewed dosing and side effects. Take with food.  - Prescription for *** to take prn muscle spasms. Reviewed dosing and side effects. Discussed this can cause drowsiness.  - MRI of *** to further evaluate *** radiculopathy. No improvement time or medications (***).  - Referral to [facility] at [facility] to discuss possible *** injections.  - Will schedule phone visit to review MRI results once I get them back.   I spent a total of *** minutes in face-to-face and non-face-to-face activities related to this patient's care today including review of outside records, review of imaging, review of symptoms, physical exam, discussion of differential diagnosis, discussion of treatment options, and documentation.   Thank you for involving me in the care of this patient.   Lucetta Russel PA-C Dept. of Neurosurgery

## 2023-07-12 ENCOUNTER — Ambulatory Visit: Admitting: Orthopedic Surgery

## 2023-07-15 DIAGNOSIS — D649 Anemia, unspecified: Secondary | ICD-10-CM | POA: Diagnosis not present

## 2023-07-15 DIAGNOSIS — E440 Moderate protein-calorie malnutrition: Secondary | ICD-10-CM | POA: Diagnosis not present

## 2023-07-15 DIAGNOSIS — L89313 Pressure ulcer of right buttock, stage 3: Secondary | ICD-10-CM | POA: Diagnosis not present

## 2023-07-26 DIAGNOSIS — L89322 Pressure ulcer of left buttock, stage 2: Secondary | ICD-10-CM | POA: Diagnosis not present

## 2023-07-26 DIAGNOSIS — N139 Obstructive and reflux uropathy, unspecified: Secondary | ICD-10-CM | POA: Diagnosis not present

## 2023-07-26 DIAGNOSIS — M84451D Pathological fracture, right femur, subsequent encounter for fracture with routine healing: Secondary | ICD-10-CM | POA: Diagnosis not present

## 2023-07-26 DIAGNOSIS — G629 Polyneuropathy, unspecified: Secondary | ICD-10-CM | POA: Diagnosis not present

## 2023-08-02 DIAGNOSIS — R8290 Unspecified abnormal findings in urine: Secondary | ICD-10-CM | POA: Diagnosis not present

## 2023-08-02 DIAGNOSIS — G4700 Insomnia, unspecified: Secondary | ICD-10-CM | POA: Diagnosis not present

## 2023-08-02 DIAGNOSIS — R319 Hematuria, unspecified: Secondary | ICD-10-CM | POA: Diagnosis not present

## 2023-08-08 DIAGNOSIS — S7291XA Unspecified fracture of right femur, initial encounter for closed fracture: Secondary | ICD-10-CM | POA: Diagnosis not present

## 2023-08-08 DIAGNOSIS — M6281 Muscle weakness (generalized): Secondary | ICD-10-CM | POA: Diagnosis not present

## 2023-08-08 DIAGNOSIS — R600 Localized edema: Secondary | ICD-10-CM | POA: Diagnosis not present

## 2023-08-08 DIAGNOSIS — G2581 Restless legs syndrome: Secondary | ICD-10-CM | POA: Diagnosis not present

## 2023-08-08 DIAGNOSIS — R2681 Unsteadiness on feet: Secondary | ICD-10-CM | POA: Diagnosis not present

## 2023-08-09 DIAGNOSIS — M6281 Muscle weakness (generalized): Secondary | ICD-10-CM | POA: Diagnosis not present

## 2023-08-09 DIAGNOSIS — R600 Localized edema: Secondary | ICD-10-CM | POA: Diagnosis not present

## 2023-08-09 DIAGNOSIS — R2681 Unsteadiness on feet: Secondary | ICD-10-CM | POA: Diagnosis not present

## 2023-08-09 DIAGNOSIS — S7291XA Unspecified fracture of right femur, initial encounter for closed fracture: Secondary | ICD-10-CM | POA: Diagnosis not present

## 2023-08-09 DIAGNOSIS — G2581 Restless legs syndrome: Secondary | ICD-10-CM | POA: Diagnosis not present

## 2023-08-10 DIAGNOSIS — S72451D Displaced supracondylar fracture without intracondylar extension of lower end of right femur, subsequent encounter for closed fracture with routine healing: Secondary | ICD-10-CM | POA: Diagnosis not present

## 2023-08-10 DIAGNOSIS — M6281 Muscle weakness (generalized): Secondary | ICD-10-CM | POA: Diagnosis not present

## 2023-08-10 DIAGNOSIS — S7291XA Unspecified fracture of right femur, initial encounter for closed fracture: Secondary | ICD-10-CM | POA: Diagnosis not present

## 2023-08-10 DIAGNOSIS — R600 Localized edema: Secondary | ICD-10-CM | POA: Diagnosis not present

## 2023-08-11 DIAGNOSIS — M6281 Muscle weakness (generalized): Secondary | ICD-10-CM | POA: Diagnosis not present

## 2023-08-11 DIAGNOSIS — R600 Localized edema: Secondary | ICD-10-CM | POA: Diagnosis not present

## 2023-08-11 DIAGNOSIS — S7291XA Unspecified fracture of right femur, initial encounter for closed fracture: Secondary | ICD-10-CM | POA: Diagnosis not present

## 2023-08-12 DIAGNOSIS — R600 Localized edema: Secondary | ICD-10-CM | POA: Diagnosis not present

## 2023-08-12 DIAGNOSIS — M6281 Muscle weakness (generalized): Secondary | ICD-10-CM | POA: Diagnosis not present

## 2023-08-12 DIAGNOSIS — S7291XA Unspecified fracture of right femur, initial encounter for closed fracture: Secondary | ICD-10-CM | POA: Diagnosis not present

## 2023-08-15 DIAGNOSIS — R600 Localized edema: Secondary | ICD-10-CM | POA: Diagnosis not present

## 2023-08-15 DIAGNOSIS — S7291XA Unspecified fracture of right femur, initial encounter for closed fracture: Secondary | ICD-10-CM | POA: Diagnosis not present

## 2023-08-15 DIAGNOSIS — M6281 Muscle weakness (generalized): Secondary | ICD-10-CM | POA: Diagnosis not present

## 2023-08-16 DIAGNOSIS — M6281 Muscle weakness (generalized): Secondary | ICD-10-CM | POA: Diagnosis not present

## 2023-08-16 DIAGNOSIS — S7291XA Unspecified fracture of right femur, initial encounter for closed fracture: Secondary | ICD-10-CM | POA: Diagnosis not present

## 2023-08-16 DIAGNOSIS — R600 Localized edema: Secondary | ICD-10-CM | POA: Diagnosis not present

## 2023-08-16 DIAGNOSIS — R52 Pain, unspecified: Secondary | ICD-10-CM | POA: Diagnosis not present

## 2023-08-16 DIAGNOSIS — G4700 Insomnia, unspecified: Secondary | ICD-10-CM | POA: Diagnosis not present

## 2023-08-16 DIAGNOSIS — K5900 Constipation, unspecified: Secondary | ICD-10-CM | POA: Diagnosis not present

## 2023-08-16 DIAGNOSIS — N400 Enlarged prostate without lower urinary tract symptoms: Secondary | ICD-10-CM | POA: Diagnosis not present

## 2023-08-17 DIAGNOSIS — R600 Localized edema: Secondary | ICD-10-CM | POA: Diagnosis not present

## 2023-08-17 DIAGNOSIS — M6281 Muscle weakness (generalized): Secondary | ICD-10-CM | POA: Diagnosis not present

## 2023-08-17 DIAGNOSIS — S7291XA Unspecified fracture of right femur, initial encounter for closed fracture: Secondary | ICD-10-CM | POA: Diagnosis not present

## 2023-08-18 DIAGNOSIS — R600 Localized edema: Secondary | ICD-10-CM | POA: Diagnosis not present

## 2023-08-18 DIAGNOSIS — M6281 Muscle weakness (generalized): Secondary | ICD-10-CM | POA: Diagnosis not present

## 2023-08-18 DIAGNOSIS — S7291XA Unspecified fracture of right femur, initial encounter for closed fracture: Secondary | ICD-10-CM | POA: Diagnosis not present

## 2023-08-19 DIAGNOSIS — R600 Localized edema: Secondary | ICD-10-CM | POA: Diagnosis not present

## 2023-08-19 DIAGNOSIS — S7291XA Unspecified fracture of right femur, initial encounter for closed fracture: Secondary | ICD-10-CM | POA: Diagnosis not present

## 2023-08-19 DIAGNOSIS — M6281 Muscle weakness (generalized): Secondary | ICD-10-CM | POA: Diagnosis not present

## 2023-08-22 DIAGNOSIS — R600 Localized edema: Secondary | ICD-10-CM | POA: Diagnosis not present

## 2023-08-22 DIAGNOSIS — M6281 Muscle weakness (generalized): Secondary | ICD-10-CM | POA: Diagnosis not present

## 2023-08-22 DIAGNOSIS — S7291XA Unspecified fracture of right femur, initial encounter for closed fracture: Secondary | ICD-10-CM | POA: Diagnosis not present

## 2023-08-23 DIAGNOSIS — S7291XA Unspecified fracture of right femur, initial encounter for closed fracture: Secondary | ICD-10-CM | POA: Diagnosis not present

## 2023-08-23 DIAGNOSIS — M6281 Muscle weakness (generalized): Secondary | ICD-10-CM | POA: Diagnosis not present

## 2023-08-23 DIAGNOSIS — R600 Localized edema: Secondary | ICD-10-CM | POA: Diagnosis not present

## 2023-08-30 DIAGNOSIS — G4700 Insomnia, unspecified: Secondary | ICD-10-CM | POA: Diagnosis not present

## 2023-09-13 DIAGNOSIS — G629 Polyneuropathy, unspecified: Secondary | ICD-10-CM | POA: Diagnosis not present

## 2023-09-13 DIAGNOSIS — S72401D Unspecified fracture of lower end of right femur, subsequent encounter for closed fracture with routine healing: Secondary | ICD-10-CM | POA: Diagnosis not present

## 2023-09-13 DIAGNOSIS — N179 Acute kidney failure, unspecified: Secondary | ICD-10-CM | POA: Diagnosis not present

## 2023-09-14 DIAGNOSIS — Z743 Need for continuous supervision: Secondary | ICD-10-CM | POA: Diagnosis not present

## 2023-09-14 DIAGNOSIS — Z7401 Bed confinement status: Secondary | ICD-10-CM | POA: Diagnosis not present

## 2023-09-20 DIAGNOSIS — K5909 Other constipation: Secondary | ICD-10-CM | POA: Diagnosis not present

## 2023-09-20 DIAGNOSIS — Z7901 Long term (current) use of anticoagulants: Secondary | ICD-10-CM | POA: Diagnosis not present

## 2023-09-20 DIAGNOSIS — M549 Dorsalgia, unspecified: Secondary | ICD-10-CM | POA: Diagnosis not present

## 2023-09-20 DIAGNOSIS — G4700 Insomnia, unspecified: Secondary | ICD-10-CM | POA: Diagnosis not present

## 2023-09-20 DIAGNOSIS — N319 Neuromuscular dysfunction of bladder, unspecified: Secondary | ICD-10-CM | POA: Diagnosis not present

## 2023-09-20 DIAGNOSIS — Z79891 Long term (current) use of opiate analgesic: Secondary | ICD-10-CM | POA: Diagnosis not present

## 2023-09-20 DIAGNOSIS — M80051D Age-related osteoporosis with current pathological fracture, right femur, subsequent encounter for fracture with routine healing: Secondary | ICD-10-CM | POA: Diagnosis not present

## 2023-09-20 DIAGNOSIS — R338 Other retention of urine: Secondary | ICD-10-CM | POA: Diagnosis not present

## 2023-09-20 DIAGNOSIS — Z466 Encounter for fitting and adjustment of urinary device: Secondary | ICD-10-CM | POA: Diagnosis not present

## 2023-09-20 DIAGNOSIS — Z9181 History of falling: Secondary | ICD-10-CM | POA: Diagnosis not present

## 2023-09-20 DIAGNOSIS — I10 Essential (primary) hypertension: Secondary | ICD-10-CM | POA: Diagnosis not present

## 2023-09-20 DIAGNOSIS — G629 Polyneuropathy, unspecified: Secondary | ICD-10-CM | POA: Diagnosis not present

## 2023-09-20 DIAGNOSIS — Z87440 Personal history of urinary (tract) infections: Secondary | ICD-10-CM | POA: Diagnosis not present

## 2023-09-20 DIAGNOSIS — N138 Other obstructive and reflux uropathy: Secondary | ICD-10-CM | POA: Diagnosis not present

## 2023-09-20 DIAGNOSIS — Z87442 Personal history of urinary calculi: Secondary | ICD-10-CM | POA: Diagnosis not present

## 2023-09-20 DIAGNOSIS — G4733 Obstructive sleep apnea (adult) (pediatric): Secondary | ICD-10-CM | POA: Diagnosis not present

## 2023-09-20 DIAGNOSIS — G8929 Other chronic pain: Secondary | ICD-10-CM | POA: Diagnosis not present

## 2023-09-20 DIAGNOSIS — Z7401 Bed confinement status: Secondary | ICD-10-CM | POA: Diagnosis not present

## 2023-09-20 DIAGNOSIS — G2581 Restless legs syndrome: Secondary | ICD-10-CM | POA: Diagnosis not present

## 2023-09-20 DIAGNOSIS — Z5982 Transportation insecurity: Secondary | ICD-10-CM | POA: Diagnosis not present

## 2023-09-20 DIAGNOSIS — M1990 Unspecified osteoarthritis, unspecified site: Secondary | ICD-10-CM | POA: Diagnosis not present

## 2023-09-24 DIAGNOSIS — E7849 Other hyperlipidemia: Secondary | ICD-10-CM | POA: Diagnosis not present

## 2023-09-24 DIAGNOSIS — I10 Essential (primary) hypertension: Secondary | ICD-10-CM | POA: Diagnosis not present

## 2023-09-24 DIAGNOSIS — E559 Vitamin D deficiency, unspecified: Secondary | ICD-10-CM | POA: Diagnosis not present

## 2023-09-24 DIAGNOSIS — D509 Iron deficiency anemia, unspecified: Secondary | ICD-10-CM | POA: Diagnosis not present

## 2023-09-24 DIAGNOSIS — D519 Vitamin B12 deficiency anemia, unspecified: Secondary | ICD-10-CM | POA: Diagnosis not present

## 2023-09-24 DIAGNOSIS — Z466 Encounter for fitting and adjustment of urinary device: Secondary | ICD-10-CM | POA: Diagnosis not present

## 2023-09-24 DIAGNOSIS — G9009 Other idiopathic peripheral autonomic neuropathy: Secondary | ICD-10-CM | POA: Diagnosis not present

## 2023-09-29 DIAGNOSIS — G4700 Insomnia, unspecified: Secondary | ICD-10-CM | POA: Diagnosis not present

## 2023-09-29 DIAGNOSIS — Z466 Encounter for fitting and adjustment of urinary device: Secondary | ICD-10-CM | POA: Diagnosis not present

## 2023-09-29 DIAGNOSIS — Z9181 History of falling: Secondary | ICD-10-CM | POA: Diagnosis not present

## 2023-09-29 DIAGNOSIS — M1990 Unspecified osteoarthritis, unspecified site: Secondary | ICD-10-CM | POA: Diagnosis not present

## 2023-09-29 DIAGNOSIS — Z7901 Long term (current) use of anticoagulants: Secondary | ICD-10-CM | POA: Diagnosis not present

## 2023-09-29 DIAGNOSIS — I10 Essential (primary) hypertension: Secondary | ICD-10-CM | POA: Diagnosis not present

## 2023-09-29 DIAGNOSIS — M549 Dorsalgia, unspecified: Secondary | ICD-10-CM | POA: Diagnosis not present

## 2023-09-29 DIAGNOSIS — G2581 Restless legs syndrome: Secondary | ICD-10-CM | POA: Diagnosis not present

## 2023-09-29 DIAGNOSIS — M80051D Age-related osteoporosis with current pathological fracture, right femur, subsequent encounter for fracture with routine healing: Secondary | ICD-10-CM | POA: Diagnosis not present

## 2023-09-29 DIAGNOSIS — Z5982 Transportation insecurity: Secondary | ICD-10-CM | POA: Diagnosis not present

## 2023-09-29 DIAGNOSIS — N138 Other obstructive and reflux uropathy: Secondary | ICD-10-CM | POA: Diagnosis not present

## 2023-09-29 DIAGNOSIS — R338 Other retention of urine: Secondary | ICD-10-CM | POA: Diagnosis not present

## 2023-09-29 DIAGNOSIS — Z7401 Bed confinement status: Secondary | ICD-10-CM | POA: Diagnosis not present

## 2023-09-29 DIAGNOSIS — Z79891 Long term (current) use of opiate analgesic: Secondary | ICD-10-CM | POA: Diagnosis not present

## 2023-09-29 DIAGNOSIS — Z87440 Personal history of urinary (tract) infections: Secondary | ICD-10-CM | POA: Diagnosis not present

## 2023-09-29 DIAGNOSIS — G8929 Other chronic pain: Secondary | ICD-10-CM | POA: Diagnosis not present

## 2023-09-29 DIAGNOSIS — K5909 Other constipation: Secondary | ICD-10-CM | POA: Diagnosis not present

## 2023-09-29 DIAGNOSIS — G4733 Obstructive sleep apnea (adult) (pediatric): Secondary | ICD-10-CM | POA: Diagnosis not present

## 2023-09-29 DIAGNOSIS — Z87442 Personal history of urinary calculi: Secondary | ICD-10-CM | POA: Diagnosis not present

## 2023-09-29 DIAGNOSIS — N319 Neuromuscular dysfunction of bladder, unspecified: Secondary | ICD-10-CM | POA: Diagnosis not present

## 2023-09-29 DIAGNOSIS — G629 Polyneuropathy, unspecified: Secondary | ICD-10-CM | POA: Diagnosis not present

## 2023-09-30 DIAGNOSIS — M79675 Pain in left toe(s): Secondary | ICD-10-CM | POA: Diagnosis not present

## 2023-09-30 DIAGNOSIS — M1990 Unspecified osteoarthritis, unspecified site: Secondary | ICD-10-CM | POA: Diagnosis not present

## 2023-09-30 DIAGNOSIS — M549 Dorsalgia, unspecified: Secondary | ICD-10-CM | POA: Diagnosis not present

## 2023-09-30 DIAGNOSIS — N138 Other obstructive and reflux uropathy: Secondary | ICD-10-CM | POA: Diagnosis not present

## 2023-09-30 DIAGNOSIS — G4700 Insomnia, unspecified: Secondary | ICD-10-CM | POA: Diagnosis not present

## 2023-09-30 DIAGNOSIS — Z466 Encounter for fitting and adjustment of urinary device: Secondary | ICD-10-CM | POA: Diagnosis not present

## 2023-09-30 DIAGNOSIS — G2581 Restless legs syndrome: Secondary | ICD-10-CM | POA: Diagnosis not present

## 2023-09-30 DIAGNOSIS — Z9181 History of falling: Secondary | ICD-10-CM | POA: Diagnosis not present

## 2023-09-30 DIAGNOSIS — N319 Neuromuscular dysfunction of bladder, unspecified: Secondary | ICD-10-CM | POA: Diagnosis not present

## 2023-09-30 DIAGNOSIS — Z7401 Bed confinement status: Secondary | ICD-10-CM | POA: Diagnosis not present

## 2023-09-30 DIAGNOSIS — Z87440 Personal history of urinary (tract) infections: Secondary | ICD-10-CM | POA: Diagnosis not present

## 2023-09-30 DIAGNOSIS — B351 Tinea unguium: Secondary | ICD-10-CM | POA: Diagnosis not present

## 2023-09-30 DIAGNOSIS — M80051D Age-related osteoporosis with current pathological fracture, right femur, subsequent encounter for fracture with routine healing: Secondary | ICD-10-CM | POA: Diagnosis not present

## 2023-09-30 DIAGNOSIS — Z7901 Long term (current) use of anticoagulants: Secondary | ICD-10-CM | POA: Diagnosis not present

## 2023-09-30 DIAGNOSIS — K5909 Other constipation: Secondary | ICD-10-CM | POA: Diagnosis not present

## 2023-09-30 DIAGNOSIS — G8929 Other chronic pain: Secondary | ICD-10-CM | POA: Diagnosis not present

## 2023-09-30 DIAGNOSIS — G4733 Obstructive sleep apnea (adult) (pediatric): Secondary | ICD-10-CM | POA: Diagnosis not present

## 2023-09-30 DIAGNOSIS — R338 Other retention of urine: Secondary | ICD-10-CM | POA: Diagnosis not present

## 2023-09-30 DIAGNOSIS — G629 Polyneuropathy, unspecified: Secondary | ICD-10-CM | POA: Diagnosis not present

## 2023-09-30 DIAGNOSIS — I10 Essential (primary) hypertension: Secondary | ICD-10-CM | POA: Diagnosis not present

## 2023-09-30 DIAGNOSIS — Z5982 Transportation insecurity: Secondary | ICD-10-CM | POA: Diagnosis not present

## 2023-09-30 DIAGNOSIS — Z87442 Personal history of urinary calculi: Secondary | ICD-10-CM | POA: Diagnosis not present

## 2023-09-30 DIAGNOSIS — M2041 Other hammer toe(s) (acquired), right foot: Secondary | ICD-10-CM | POA: Diagnosis not present

## 2023-10-01 DIAGNOSIS — Z7401 Bed confinement status: Secondary | ICD-10-CM | POA: Diagnosis not present

## 2023-10-01 DIAGNOSIS — R338 Other retention of urine: Secondary | ICD-10-CM | POA: Diagnosis not present

## 2023-10-01 DIAGNOSIS — G8929 Other chronic pain: Secondary | ICD-10-CM | POA: Diagnosis not present

## 2023-10-01 DIAGNOSIS — K5909 Other constipation: Secondary | ICD-10-CM | POA: Diagnosis not present

## 2023-10-01 DIAGNOSIS — Z87440 Personal history of urinary (tract) infections: Secondary | ICD-10-CM | POA: Diagnosis not present

## 2023-10-01 DIAGNOSIS — Z87442 Personal history of urinary calculi: Secondary | ICD-10-CM | POA: Diagnosis not present

## 2023-10-01 DIAGNOSIS — I10 Essential (primary) hypertension: Secondary | ICD-10-CM | POA: Diagnosis not present

## 2023-10-01 DIAGNOSIS — Z9181 History of falling: Secondary | ICD-10-CM | POA: Diagnosis not present

## 2023-10-01 DIAGNOSIS — G2581 Restless legs syndrome: Secondary | ICD-10-CM | POA: Diagnosis not present

## 2023-10-01 DIAGNOSIS — Z79891 Long term (current) use of opiate analgesic: Secondary | ICD-10-CM | POA: Diagnosis not present

## 2023-10-01 DIAGNOSIS — M80051D Age-related osteoporosis with current pathological fracture, right femur, subsequent encounter for fracture with routine healing: Secondary | ICD-10-CM | POA: Diagnosis not present

## 2023-10-01 DIAGNOSIS — Z7901 Long term (current) use of anticoagulants: Secondary | ICD-10-CM | POA: Diagnosis not present

## 2023-10-01 DIAGNOSIS — Z466 Encounter for fitting and adjustment of urinary device: Secondary | ICD-10-CM | POA: Diagnosis not present

## 2023-10-01 DIAGNOSIS — N138 Other obstructive and reflux uropathy: Secondary | ICD-10-CM | POA: Diagnosis not present

## 2023-10-01 DIAGNOSIS — G4733 Obstructive sleep apnea (adult) (pediatric): Secondary | ICD-10-CM | POA: Diagnosis not present

## 2023-10-01 DIAGNOSIS — M1990 Unspecified osteoarthritis, unspecified site: Secondary | ICD-10-CM | POA: Diagnosis not present

## 2023-10-01 DIAGNOSIS — M549 Dorsalgia, unspecified: Secondary | ICD-10-CM | POA: Diagnosis not present

## 2023-10-01 DIAGNOSIS — G629 Polyneuropathy, unspecified: Secondary | ICD-10-CM | POA: Diagnosis not present

## 2023-10-01 DIAGNOSIS — G4700 Insomnia, unspecified: Secondary | ICD-10-CM | POA: Diagnosis not present

## 2023-10-01 DIAGNOSIS — N319 Neuromuscular dysfunction of bladder, unspecified: Secondary | ICD-10-CM | POA: Diagnosis not present

## 2023-10-04 DIAGNOSIS — N138 Other obstructive and reflux uropathy: Secondary | ICD-10-CM | POA: Diagnosis not present

## 2023-10-04 DIAGNOSIS — I10 Essential (primary) hypertension: Secondary | ICD-10-CM | POA: Diagnosis not present

## 2023-10-04 DIAGNOSIS — Z7901 Long term (current) use of anticoagulants: Secondary | ICD-10-CM | POA: Diagnosis not present

## 2023-10-04 DIAGNOSIS — G4733 Obstructive sleep apnea (adult) (pediatric): Secondary | ICD-10-CM | POA: Diagnosis not present

## 2023-10-04 DIAGNOSIS — Z79891 Long term (current) use of opiate analgesic: Secondary | ICD-10-CM | POA: Diagnosis not present

## 2023-10-04 DIAGNOSIS — M1990 Unspecified osteoarthritis, unspecified site: Secondary | ICD-10-CM | POA: Diagnosis not present

## 2023-10-04 DIAGNOSIS — Z9181 History of falling: Secondary | ICD-10-CM | POA: Diagnosis not present

## 2023-10-04 DIAGNOSIS — Z87440 Personal history of urinary (tract) infections: Secondary | ICD-10-CM | POA: Diagnosis not present

## 2023-10-04 DIAGNOSIS — G2581 Restless legs syndrome: Secondary | ICD-10-CM | POA: Diagnosis not present

## 2023-10-04 DIAGNOSIS — Z87442 Personal history of urinary calculi: Secondary | ICD-10-CM | POA: Diagnosis not present

## 2023-10-04 DIAGNOSIS — N319 Neuromuscular dysfunction of bladder, unspecified: Secondary | ICD-10-CM | POA: Diagnosis not present

## 2023-10-04 DIAGNOSIS — G4700 Insomnia, unspecified: Secondary | ICD-10-CM | POA: Diagnosis not present

## 2023-10-04 DIAGNOSIS — K5909 Other constipation: Secondary | ICD-10-CM | POA: Diagnosis not present

## 2023-10-04 DIAGNOSIS — M80051D Age-related osteoporosis with current pathological fracture, right femur, subsequent encounter for fracture with routine healing: Secondary | ICD-10-CM | POA: Diagnosis not present

## 2023-10-04 DIAGNOSIS — Z7401 Bed confinement status: Secondary | ICD-10-CM | POA: Diagnosis not present

## 2023-10-04 DIAGNOSIS — G8929 Other chronic pain: Secondary | ICD-10-CM | POA: Diagnosis not present

## 2023-10-04 DIAGNOSIS — Z5982 Transportation insecurity: Secondary | ICD-10-CM | POA: Diagnosis not present

## 2023-10-04 DIAGNOSIS — G629 Polyneuropathy, unspecified: Secondary | ICD-10-CM | POA: Diagnosis not present

## 2023-10-04 DIAGNOSIS — R338 Other retention of urine: Secondary | ICD-10-CM | POA: Diagnosis not present

## 2023-10-04 DIAGNOSIS — M549 Dorsalgia, unspecified: Secondary | ICD-10-CM | POA: Diagnosis not present

## 2023-10-04 DIAGNOSIS — Z466 Encounter for fitting and adjustment of urinary device: Secondary | ICD-10-CM | POA: Diagnosis not present

## 2023-10-07 DIAGNOSIS — Z7901 Long term (current) use of anticoagulants: Secondary | ICD-10-CM | POA: Diagnosis not present

## 2023-10-07 DIAGNOSIS — G8929 Other chronic pain: Secondary | ICD-10-CM | POA: Diagnosis not present

## 2023-10-07 DIAGNOSIS — Z87440 Personal history of urinary (tract) infections: Secondary | ICD-10-CM | POA: Diagnosis not present

## 2023-10-07 DIAGNOSIS — I10 Essential (primary) hypertension: Secondary | ICD-10-CM | POA: Diagnosis not present

## 2023-10-07 DIAGNOSIS — R338 Other retention of urine: Secondary | ICD-10-CM | POA: Diagnosis not present

## 2023-10-07 DIAGNOSIS — K5909 Other constipation: Secondary | ICD-10-CM | POA: Diagnosis not present

## 2023-10-07 DIAGNOSIS — M1990 Unspecified osteoarthritis, unspecified site: Secondary | ICD-10-CM | POA: Diagnosis not present

## 2023-10-07 DIAGNOSIS — N138 Other obstructive and reflux uropathy: Secondary | ICD-10-CM | POA: Diagnosis not present

## 2023-10-07 DIAGNOSIS — M80051D Age-related osteoporosis with current pathological fracture, right femur, subsequent encounter for fracture with routine healing: Secondary | ICD-10-CM | POA: Diagnosis not present

## 2023-10-07 DIAGNOSIS — G4700 Insomnia, unspecified: Secondary | ICD-10-CM | POA: Diagnosis not present

## 2023-10-07 DIAGNOSIS — G2581 Restless legs syndrome: Secondary | ICD-10-CM | POA: Diagnosis not present

## 2023-10-07 DIAGNOSIS — M549 Dorsalgia, unspecified: Secondary | ICD-10-CM | POA: Diagnosis not present

## 2023-10-07 DIAGNOSIS — Z79891 Long term (current) use of opiate analgesic: Secondary | ICD-10-CM | POA: Diagnosis not present

## 2023-10-07 DIAGNOSIS — Z9181 History of falling: Secondary | ICD-10-CM | POA: Diagnosis not present

## 2023-10-07 DIAGNOSIS — G4733 Obstructive sleep apnea (adult) (pediatric): Secondary | ICD-10-CM | POA: Diagnosis not present

## 2023-10-07 DIAGNOSIS — Z5982 Transportation insecurity: Secondary | ICD-10-CM | POA: Diagnosis not present

## 2023-10-07 DIAGNOSIS — N319 Neuromuscular dysfunction of bladder, unspecified: Secondary | ICD-10-CM | POA: Diagnosis not present

## 2023-10-07 DIAGNOSIS — Z466 Encounter for fitting and adjustment of urinary device: Secondary | ICD-10-CM | POA: Diagnosis not present

## 2023-10-07 DIAGNOSIS — Z87442 Personal history of urinary calculi: Secondary | ICD-10-CM | POA: Diagnosis not present

## 2023-10-07 DIAGNOSIS — G629 Polyneuropathy, unspecified: Secondary | ICD-10-CM | POA: Diagnosis not present

## 2023-10-07 DIAGNOSIS — Z7401 Bed confinement status: Secondary | ICD-10-CM | POA: Diagnosis not present

## 2023-10-13 DIAGNOSIS — Z87442 Personal history of urinary calculi: Secondary | ICD-10-CM | POA: Diagnosis not present

## 2023-10-13 DIAGNOSIS — Z87440 Personal history of urinary (tract) infections: Secondary | ICD-10-CM | POA: Diagnosis not present

## 2023-10-13 DIAGNOSIS — G8929 Other chronic pain: Secondary | ICD-10-CM | POA: Diagnosis not present

## 2023-10-13 DIAGNOSIS — M80051D Age-related osteoporosis with current pathological fracture, right femur, subsequent encounter for fracture with routine healing: Secondary | ICD-10-CM | POA: Diagnosis not present

## 2023-10-13 DIAGNOSIS — Z7401 Bed confinement status: Secondary | ICD-10-CM | POA: Diagnosis not present

## 2023-10-13 DIAGNOSIS — Z5982 Transportation insecurity: Secondary | ICD-10-CM | POA: Diagnosis not present

## 2023-10-13 DIAGNOSIS — Z7901 Long term (current) use of anticoagulants: Secondary | ICD-10-CM | POA: Diagnosis not present

## 2023-10-13 DIAGNOSIS — G629 Polyneuropathy, unspecified: Secondary | ICD-10-CM | POA: Diagnosis not present

## 2023-10-13 DIAGNOSIS — R338 Other retention of urine: Secondary | ICD-10-CM | POA: Diagnosis not present

## 2023-10-13 DIAGNOSIS — N138 Other obstructive and reflux uropathy: Secondary | ICD-10-CM | POA: Diagnosis not present

## 2023-10-13 DIAGNOSIS — Z79891 Long term (current) use of opiate analgesic: Secondary | ICD-10-CM | POA: Diagnosis not present

## 2023-10-13 DIAGNOSIS — I10 Essential (primary) hypertension: Secondary | ICD-10-CM | POA: Diagnosis not present

## 2023-10-13 DIAGNOSIS — G2581 Restless legs syndrome: Secondary | ICD-10-CM | POA: Diagnosis not present

## 2023-10-13 DIAGNOSIS — G4733 Obstructive sleep apnea (adult) (pediatric): Secondary | ICD-10-CM | POA: Diagnosis not present

## 2023-10-13 DIAGNOSIS — M1990 Unspecified osteoarthritis, unspecified site: Secondary | ICD-10-CM | POA: Diagnosis not present

## 2023-10-13 DIAGNOSIS — M549 Dorsalgia, unspecified: Secondary | ICD-10-CM | POA: Diagnosis not present

## 2023-10-13 DIAGNOSIS — Z466 Encounter for fitting and adjustment of urinary device: Secondary | ICD-10-CM | POA: Diagnosis not present

## 2023-10-13 DIAGNOSIS — K5909 Other constipation: Secondary | ICD-10-CM | POA: Diagnosis not present

## 2023-10-13 DIAGNOSIS — Z9181 History of falling: Secondary | ICD-10-CM | POA: Diagnosis not present

## 2023-10-13 DIAGNOSIS — G4700 Insomnia, unspecified: Secondary | ICD-10-CM | POA: Diagnosis not present

## 2023-10-13 DIAGNOSIS — N319 Neuromuscular dysfunction of bladder, unspecified: Secondary | ICD-10-CM | POA: Diagnosis not present

## 2023-10-14 DIAGNOSIS — G629 Polyneuropathy, unspecified: Secondary | ICD-10-CM | POA: Diagnosis not present

## 2023-10-14 DIAGNOSIS — N179 Acute kidney failure, unspecified: Secondary | ICD-10-CM | POA: Diagnosis not present

## 2023-10-14 DIAGNOSIS — I10 Essential (primary) hypertension: Secondary | ICD-10-CM | POA: Diagnosis not present

## 2023-10-14 DIAGNOSIS — Z79891 Long term (current) use of opiate analgesic: Secondary | ICD-10-CM | POA: Diagnosis not present

## 2023-10-14 DIAGNOSIS — Z9181 History of falling: Secondary | ICD-10-CM | POA: Diagnosis not present

## 2023-10-14 DIAGNOSIS — S72401D Unspecified fracture of lower end of right femur, subsequent encounter for closed fracture with routine healing: Secondary | ICD-10-CM | POA: Diagnosis not present

## 2023-10-14 DIAGNOSIS — K5909 Other constipation: Secondary | ICD-10-CM | POA: Diagnosis not present

## 2023-10-14 DIAGNOSIS — Z5982 Transportation insecurity: Secondary | ICD-10-CM | POA: Diagnosis not present

## 2023-10-14 DIAGNOSIS — G8929 Other chronic pain: Secondary | ICD-10-CM | POA: Diagnosis not present

## 2023-10-14 DIAGNOSIS — G4733 Obstructive sleep apnea (adult) (pediatric): Secondary | ICD-10-CM | POA: Diagnosis not present

## 2023-10-14 DIAGNOSIS — N319 Neuromuscular dysfunction of bladder, unspecified: Secondary | ICD-10-CM | POA: Diagnosis not present

## 2023-10-14 DIAGNOSIS — Z7401 Bed confinement status: Secondary | ICD-10-CM | POA: Diagnosis not present

## 2023-10-14 DIAGNOSIS — G4700 Insomnia, unspecified: Secondary | ICD-10-CM | POA: Diagnosis not present

## 2023-10-14 DIAGNOSIS — N138 Other obstructive and reflux uropathy: Secondary | ICD-10-CM | POA: Diagnosis not present

## 2023-10-14 DIAGNOSIS — Z7901 Long term (current) use of anticoagulants: Secondary | ICD-10-CM | POA: Diagnosis not present

## 2023-10-14 DIAGNOSIS — Z87442 Personal history of urinary calculi: Secondary | ICD-10-CM | POA: Diagnosis not present

## 2023-10-14 DIAGNOSIS — Z87440 Personal history of urinary (tract) infections: Secondary | ICD-10-CM | POA: Diagnosis not present

## 2023-10-14 DIAGNOSIS — R338 Other retention of urine: Secondary | ICD-10-CM | POA: Diagnosis not present

## 2023-10-14 DIAGNOSIS — M80051D Age-related osteoporosis with current pathological fracture, right femur, subsequent encounter for fracture with routine healing: Secondary | ICD-10-CM | POA: Diagnosis not present

## 2023-10-14 DIAGNOSIS — G2581 Restless legs syndrome: Secondary | ICD-10-CM | POA: Diagnosis not present

## 2023-10-14 DIAGNOSIS — M1990 Unspecified osteoarthritis, unspecified site: Secondary | ICD-10-CM | POA: Diagnosis not present

## 2023-10-14 DIAGNOSIS — Z466 Encounter for fitting and adjustment of urinary device: Secondary | ICD-10-CM | POA: Diagnosis not present

## 2023-10-14 DIAGNOSIS — M549 Dorsalgia, unspecified: Secondary | ICD-10-CM | POA: Diagnosis not present

## 2023-10-15 DIAGNOSIS — Z466 Encounter for fitting and adjustment of urinary device: Secondary | ICD-10-CM | POA: Diagnosis not present

## 2023-10-15 DIAGNOSIS — Z9181 History of falling: Secondary | ICD-10-CM | POA: Diagnosis not present

## 2023-10-15 DIAGNOSIS — G4700 Insomnia, unspecified: Secondary | ICD-10-CM | POA: Diagnosis not present

## 2023-10-15 DIAGNOSIS — Z7901 Long term (current) use of anticoagulants: Secondary | ICD-10-CM | POA: Diagnosis not present

## 2023-10-15 DIAGNOSIS — N138 Other obstructive and reflux uropathy: Secondary | ICD-10-CM | POA: Diagnosis not present

## 2023-10-15 DIAGNOSIS — Z79891 Long term (current) use of opiate analgesic: Secondary | ICD-10-CM | POA: Diagnosis not present

## 2023-10-15 DIAGNOSIS — M549 Dorsalgia, unspecified: Secondary | ICD-10-CM | POA: Diagnosis not present

## 2023-10-15 DIAGNOSIS — G629 Polyneuropathy, unspecified: Secondary | ICD-10-CM | POA: Diagnosis not present

## 2023-10-15 DIAGNOSIS — K5909 Other constipation: Secondary | ICD-10-CM | POA: Diagnosis not present

## 2023-10-15 DIAGNOSIS — Z87442 Personal history of urinary calculi: Secondary | ICD-10-CM | POA: Diagnosis not present

## 2023-10-15 DIAGNOSIS — Z87440 Personal history of urinary (tract) infections: Secondary | ICD-10-CM | POA: Diagnosis not present

## 2023-10-15 DIAGNOSIS — N319 Neuromuscular dysfunction of bladder, unspecified: Secondary | ICD-10-CM | POA: Diagnosis not present

## 2023-10-15 DIAGNOSIS — M80051D Age-related osteoporosis with current pathological fracture, right femur, subsequent encounter for fracture with routine healing: Secondary | ICD-10-CM | POA: Diagnosis not present

## 2023-10-15 DIAGNOSIS — G2581 Restless legs syndrome: Secondary | ICD-10-CM | POA: Diagnosis not present

## 2023-10-15 DIAGNOSIS — Z7401 Bed confinement status: Secondary | ICD-10-CM | POA: Diagnosis not present

## 2023-10-15 DIAGNOSIS — G8929 Other chronic pain: Secondary | ICD-10-CM | POA: Diagnosis not present

## 2023-10-15 DIAGNOSIS — M1990 Unspecified osteoarthritis, unspecified site: Secondary | ICD-10-CM | POA: Diagnosis not present

## 2023-10-15 DIAGNOSIS — I10 Essential (primary) hypertension: Secondary | ICD-10-CM | POA: Diagnosis not present

## 2023-10-15 DIAGNOSIS — R338 Other retention of urine: Secondary | ICD-10-CM | POA: Diagnosis not present

## 2023-10-15 DIAGNOSIS — G4733 Obstructive sleep apnea (adult) (pediatric): Secondary | ICD-10-CM | POA: Diagnosis not present

## 2023-10-15 DIAGNOSIS — Z5982 Transportation insecurity: Secondary | ICD-10-CM | POA: Diagnosis not present

## 2023-10-18 DIAGNOSIS — M80051D Age-related osteoporosis with current pathological fracture, right femur, subsequent encounter for fracture with routine healing: Secondary | ICD-10-CM | POA: Diagnosis not present

## 2023-10-18 DIAGNOSIS — S7291XD Unspecified fracture of right femur, subsequent encounter for closed fracture with routine healing: Secondary | ICD-10-CM | POA: Diagnosis not present

## 2023-10-18 DIAGNOSIS — Z87442 Personal history of urinary calculi: Secondary | ICD-10-CM | POA: Diagnosis not present

## 2023-10-18 DIAGNOSIS — G8929 Other chronic pain: Secondary | ICD-10-CM | POA: Diagnosis not present

## 2023-10-18 DIAGNOSIS — Z87440 Personal history of urinary (tract) infections: Secondary | ICD-10-CM | POA: Diagnosis not present

## 2023-10-18 DIAGNOSIS — Z466 Encounter for fitting and adjustment of urinary device: Secondary | ICD-10-CM | POA: Diagnosis not present

## 2023-10-18 DIAGNOSIS — E7800 Pure hypercholesterolemia, unspecified: Secondary | ICD-10-CM | POA: Diagnosis not present

## 2023-10-18 DIAGNOSIS — R5381 Other malaise: Secondary | ICD-10-CM | POA: Diagnosis not present

## 2023-10-18 DIAGNOSIS — Z5982 Transportation insecurity: Secondary | ICD-10-CM | POA: Diagnosis not present

## 2023-10-18 DIAGNOSIS — G2581 Restless legs syndrome: Secondary | ICD-10-CM | POA: Diagnosis not present

## 2023-10-18 DIAGNOSIS — G629 Polyneuropathy, unspecified: Secondary | ICD-10-CM | POA: Diagnosis not present

## 2023-10-18 DIAGNOSIS — Z0000 Encounter for general adult medical examination without abnormal findings: Secondary | ICD-10-CM | POA: Diagnosis not present

## 2023-10-18 DIAGNOSIS — K5900 Constipation, unspecified: Secondary | ICD-10-CM | POA: Diagnosis not present

## 2023-10-18 DIAGNOSIS — M1990 Unspecified osteoarthritis, unspecified site: Secondary | ICD-10-CM | POA: Diagnosis not present

## 2023-10-18 DIAGNOSIS — K5909 Other constipation: Secondary | ICD-10-CM | POA: Diagnosis not present

## 2023-10-18 DIAGNOSIS — N319 Neuromuscular dysfunction of bladder, unspecified: Secondary | ICD-10-CM | POA: Diagnosis not present

## 2023-10-18 DIAGNOSIS — M549 Dorsalgia, unspecified: Secondary | ICD-10-CM | POA: Diagnosis not present

## 2023-10-18 DIAGNOSIS — R338 Other retention of urine: Secondary | ICD-10-CM | POA: Diagnosis not present

## 2023-10-18 DIAGNOSIS — I10 Essential (primary) hypertension: Secondary | ICD-10-CM | POA: Diagnosis not present

## 2023-10-18 DIAGNOSIS — Z7401 Bed confinement status: Secondary | ICD-10-CM | POA: Diagnosis not present

## 2023-10-18 DIAGNOSIS — G4733 Obstructive sleep apnea (adult) (pediatric): Secondary | ICD-10-CM | POA: Diagnosis not present

## 2023-10-18 DIAGNOSIS — Z79899 Other long term (current) drug therapy: Secondary | ICD-10-CM | POA: Diagnosis not present

## 2023-10-18 DIAGNOSIS — Z9181 History of falling: Secondary | ICD-10-CM | POA: Diagnosis not present

## 2023-10-18 DIAGNOSIS — Z7901 Long term (current) use of anticoagulants: Secondary | ICD-10-CM | POA: Diagnosis not present

## 2023-10-18 DIAGNOSIS — G4700 Insomnia, unspecified: Secondary | ICD-10-CM | POA: Diagnosis not present

## 2023-10-18 DIAGNOSIS — N138 Other obstructive and reflux uropathy: Secondary | ICD-10-CM | POA: Diagnosis not present

## 2023-10-18 DIAGNOSIS — Z23 Encounter for immunization: Secondary | ICD-10-CM | POA: Diagnosis not present

## 2023-10-18 DIAGNOSIS — Z978 Presence of other specified devices: Secondary | ICD-10-CM | POA: Diagnosis not present

## 2023-10-18 DIAGNOSIS — Z79891 Long term (current) use of opiate analgesic: Secondary | ICD-10-CM | POA: Diagnosis not present

## 2023-10-20 DIAGNOSIS — G2581 Restless legs syndrome: Secondary | ICD-10-CM | POA: Diagnosis not present

## 2023-10-20 DIAGNOSIS — Z7901 Long term (current) use of anticoagulants: Secondary | ICD-10-CM | POA: Diagnosis not present

## 2023-10-20 DIAGNOSIS — G4700 Insomnia, unspecified: Secondary | ICD-10-CM | POA: Diagnosis not present

## 2023-10-20 DIAGNOSIS — Z87442 Personal history of urinary calculi: Secondary | ICD-10-CM | POA: Diagnosis not present

## 2023-10-20 DIAGNOSIS — Z9181 History of falling: Secondary | ICD-10-CM | POA: Diagnosis not present

## 2023-10-20 DIAGNOSIS — Z7401 Bed confinement status: Secondary | ICD-10-CM | POA: Diagnosis not present

## 2023-10-20 DIAGNOSIS — K5909 Other constipation: Secondary | ICD-10-CM | POA: Diagnosis not present

## 2023-10-20 DIAGNOSIS — M1990 Unspecified osteoarthritis, unspecified site: Secondary | ICD-10-CM | POA: Diagnosis not present

## 2023-10-20 DIAGNOSIS — I10 Essential (primary) hypertension: Secondary | ICD-10-CM | POA: Diagnosis not present

## 2023-10-20 DIAGNOSIS — G8929 Other chronic pain: Secondary | ICD-10-CM | POA: Diagnosis not present

## 2023-10-20 DIAGNOSIS — Z466 Encounter for fitting and adjustment of urinary device: Secondary | ICD-10-CM | POA: Diagnosis not present

## 2023-10-20 DIAGNOSIS — M80051D Age-related osteoporosis with current pathological fracture, right femur, subsequent encounter for fracture with routine healing: Secondary | ICD-10-CM | POA: Diagnosis not present

## 2023-10-20 DIAGNOSIS — Z79891 Long term (current) use of opiate analgesic: Secondary | ICD-10-CM | POA: Diagnosis not present

## 2023-10-20 DIAGNOSIS — Z87440 Personal history of urinary (tract) infections: Secondary | ICD-10-CM | POA: Diagnosis not present

## 2023-10-20 DIAGNOSIS — G4733 Obstructive sleep apnea (adult) (pediatric): Secondary | ICD-10-CM | POA: Diagnosis not present

## 2023-10-20 DIAGNOSIS — N138 Other obstructive and reflux uropathy: Secondary | ICD-10-CM | POA: Diagnosis not present

## 2023-10-20 DIAGNOSIS — N319 Neuromuscular dysfunction of bladder, unspecified: Secondary | ICD-10-CM | POA: Diagnosis not present

## 2023-10-20 DIAGNOSIS — G629 Polyneuropathy, unspecified: Secondary | ICD-10-CM | POA: Diagnosis not present

## 2023-10-20 DIAGNOSIS — R338 Other retention of urine: Secondary | ICD-10-CM | POA: Diagnosis not present

## 2023-10-20 DIAGNOSIS — M549 Dorsalgia, unspecified: Secondary | ICD-10-CM | POA: Diagnosis not present

## 2023-10-20 DIAGNOSIS — Z5982 Transportation insecurity: Secondary | ICD-10-CM | POA: Diagnosis not present

## 2023-10-22 DIAGNOSIS — M1990 Unspecified osteoarthritis, unspecified site: Secondary | ICD-10-CM | POA: Diagnosis not present

## 2023-10-22 DIAGNOSIS — M80051D Age-related osteoporosis with current pathological fracture, right femur, subsequent encounter for fracture with routine healing: Secondary | ICD-10-CM | POA: Diagnosis not present

## 2023-10-22 DIAGNOSIS — K5909 Other constipation: Secondary | ICD-10-CM | POA: Diagnosis not present

## 2023-10-22 DIAGNOSIS — I10 Essential (primary) hypertension: Secondary | ICD-10-CM | POA: Diagnosis not present

## 2023-10-22 DIAGNOSIS — Z87442 Personal history of urinary calculi: Secondary | ICD-10-CM | POA: Diagnosis not present

## 2023-10-22 DIAGNOSIS — M549 Dorsalgia, unspecified: Secondary | ICD-10-CM | POA: Diagnosis not present

## 2023-10-22 DIAGNOSIS — Z9181 History of falling: Secondary | ICD-10-CM | POA: Diagnosis not present

## 2023-10-22 DIAGNOSIS — Z7401 Bed confinement status: Secondary | ICD-10-CM | POA: Diagnosis not present

## 2023-10-22 DIAGNOSIS — Z79891 Long term (current) use of opiate analgesic: Secondary | ICD-10-CM | POA: Diagnosis not present

## 2023-10-22 DIAGNOSIS — Z87440 Personal history of urinary (tract) infections: Secondary | ICD-10-CM | POA: Diagnosis not present

## 2023-10-22 DIAGNOSIS — Z466 Encounter for fitting and adjustment of urinary device: Secondary | ICD-10-CM | POA: Diagnosis not present

## 2023-10-22 DIAGNOSIS — G4700 Insomnia, unspecified: Secondary | ICD-10-CM | POA: Diagnosis not present

## 2023-10-22 DIAGNOSIS — N319 Neuromuscular dysfunction of bladder, unspecified: Secondary | ICD-10-CM | POA: Diagnosis not present

## 2023-10-22 DIAGNOSIS — G2581 Restless legs syndrome: Secondary | ICD-10-CM | POA: Diagnosis not present

## 2023-10-22 DIAGNOSIS — Z5982 Transportation insecurity: Secondary | ICD-10-CM | POA: Diagnosis not present

## 2023-10-22 DIAGNOSIS — N138 Other obstructive and reflux uropathy: Secondary | ICD-10-CM | POA: Diagnosis not present

## 2023-10-22 DIAGNOSIS — Z7901 Long term (current) use of anticoagulants: Secondary | ICD-10-CM | POA: Diagnosis not present

## 2023-10-22 DIAGNOSIS — R338 Other retention of urine: Secondary | ICD-10-CM | POA: Diagnosis not present

## 2023-10-22 DIAGNOSIS — G8929 Other chronic pain: Secondary | ICD-10-CM | POA: Diagnosis not present

## 2023-10-22 DIAGNOSIS — G629 Polyneuropathy, unspecified: Secondary | ICD-10-CM | POA: Diagnosis not present

## 2023-10-22 DIAGNOSIS — G4733 Obstructive sleep apnea (adult) (pediatric): Secondary | ICD-10-CM | POA: Diagnosis not present

## 2023-10-25 DIAGNOSIS — Z466 Encounter for fitting and adjustment of urinary device: Secondary | ICD-10-CM | POA: Diagnosis not present

## 2023-10-25 DIAGNOSIS — G8929 Other chronic pain: Secondary | ICD-10-CM | POA: Diagnosis not present

## 2023-10-25 DIAGNOSIS — M549 Dorsalgia, unspecified: Secondary | ICD-10-CM | POA: Diagnosis not present

## 2023-10-25 DIAGNOSIS — Z7901 Long term (current) use of anticoagulants: Secondary | ICD-10-CM | POA: Diagnosis not present

## 2023-10-25 DIAGNOSIS — Z87442 Personal history of urinary calculi: Secondary | ICD-10-CM | POA: Diagnosis not present

## 2023-10-25 DIAGNOSIS — G4733 Obstructive sleep apnea (adult) (pediatric): Secondary | ICD-10-CM | POA: Diagnosis not present

## 2023-10-25 DIAGNOSIS — Z9181 History of falling: Secondary | ICD-10-CM | POA: Diagnosis not present

## 2023-10-25 DIAGNOSIS — G629 Polyneuropathy, unspecified: Secondary | ICD-10-CM | POA: Diagnosis not present

## 2023-10-25 DIAGNOSIS — G2581 Restless legs syndrome: Secondary | ICD-10-CM | POA: Diagnosis not present

## 2023-10-25 DIAGNOSIS — K5909 Other constipation: Secondary | ICD-10-CM | POA: Diagnosis not present

## 2023-10-25 DIAGNOSIS — Z87440 Personal history of urinary (tract) infections: Secondary | ICD-10-CM | POA: Diagnosis not present

## 2023-10-25 DIAGNOSIS — M80051D Age-related osteoporosis with current pathological fracture, right femur, subsequent encounter for fracture with routine healing: Secondary | ICD-10-CM | POA: Diagnosis not present

## 2023-10-25 DIAGNOSIS — G4700 Insomnia, unspecified: Secondary | ICD-10-CM | POA: Diagnosis not present

## 2023-10-25 DIAGNOSIS — R338 Other retention of urine: Secondary | ICD-10-CM | POA: Diagnosis not present

## 2023-10-25 DIAGNOSIS — N319 Neuromuscular dysfunction of bladder, unspecified: Secondary | ICD-10-CM | POA: Diagnosis not present

## 2023-10-25 DIAGNOSIS — N138 Other obstructive and reflux uropathy: Secondary | ICD-10-CM | POA: Diagnosis not present

## 2023-10-25 DIAGNOSIS — Z79891 Long term (current) use of opiate analgesic: Secondary | ICD-10-CM | POA: Diagnosis not present

## 2023-10-25 DIAGNOSIS — M1990 Unspecified osteoarthritis, unspecified site: Secondary | ICD-10-CM | POA: Diagnosis not present

## 2023-10-25 DIAGNOSIS — Z7401 Bed confinement status: Secondary | ICD-10-CM | POA: Diagnosis not present

## 2023-10-25 DIAGNOSIS — I10 Essential (primary) hypertension: Secondary | ICD-10-CM | POA: Diagnosis not present

## 2023-10-25 DIAGNOSIS — Z5982 Transportation insecurity: Secondary | ICD-10-CM | POA: Diagnosis not present

## 2023-10-27 DIAGNOSIS — Z5982 Transportation insecurity: Secondary | ICD-10-CM | POA: Diagnosis not present

## 2023-10-27 DIAGNOSIS — M1990 Unspecified osteoarthritis, unspecified site: Secondary | ICD-10-CM | POA: Diagnosis not present

## 2023-10-27 DIAGNOSIS — N138 Other obstructive and reflux uropathy: Secondary | ICD-10-CM | POA: Diagnosis not present

## 2023-10-27 DIAGNOSIS — G4733 Obstructive sleep apnea (adult) (pediatric): Secondary | ICD-10-CM | POA: Diagnosis not present

## 2023-10-27 DIAGNOSIS — Z466 Encounter for fitting and adjustment of urinary device: Secondary | ICD-10-CM | POA: Diagnosis not present

## 2023-10-27 DIAGNOSIS — N319 Neuromuscular dysfunction of bladder, unspecified: Secondary | ICD-10-CM | POA: Diagnosis not present

## 2023-10-27 DIAGNOSIS — K5909 Other constipation: Secondary | ICD-10-CM | POA: Diagnosis not present

## 2023-10-27 DIAGNOSIS — Z7401 Bed confinement status: Secondary | ICD-10-CM | POA: Diagnosis not present

## 2023-10-27 DIAGNOSIS — Z87442 Personal history of urinary calculi: Secondary | ICD-10-CM | POA: Diagnosis not present

## 2023-10-27 DIAGNOSIS — R338 Other retention of urine: Secondary | ICD-10-CM | POA: Diagnosis not present

## 2023-10-27 DIAGNOSIS — M80051D Age-related osteoporosis with current pathological fracture, right femur, subsequent encounter for fracture with routine healing: Secondary | ICD-10-CM | POA: Diagnosis not present

## 2023-10-27 DIAGNOSIS — Z9181 History of falling: Secondary | ICD-10-CM | POA: Diagnosis not present

## 2023-10-27 DIAGNOSIS — G8929 Other chronic pain: Secondary | ICD-10-CM | POA: Diagnosis not present

## 2023-10-27 DIAGNOSIS — G4700 Insomnia, unspecified: Secondary | ICD-10-CM | POA: Diagnosis not present

## 2023-10-27 DIAGNOSIS — G629 Polyneuropathy, unspecified: Secondary | ICD-10-CM | POA: Diagnosis not present

## 2023-10-27 DIAGNOSIS — Z7901 Long term (current) use of anticoagulants: Secondary | ICD-10-CM | POA: Diagnosis not present

## 2023-10-27 DIAGNOSIS — G2581 Restless legs syndrome: Secondary | ICD-10-CM | POA: Diagnosis not present

## 2023-10-27 DIAGNOSIS — I10 Essential (primary) hypertension: Secondary | ICD-10-CM | POA: Diagnosis not present

## 2023-10-27 DIAGNOSIS — M549 Dorsalgia, unspecified: Secondary | ICD-10-CM | POA: Diagnosis not present

## 2023-10-27 DIAGNOSIS — Z79891 Long term (current) use of opiate analgesic: Secondary | ICD-10-CM | POA: Diagnosis not present

## 2023-10-27 DIAGNOSIS — Z87440 Personal history of urinary (tract) infections: Secondary | ICD-10-CM | POA: Diagnosis not present

## 2023-10-28 DIAGNOSIS — G629 Polyneuropathy, unspecified: Secondary | ICD-10-CM | POA: Diagnosis not present

## 2023-10-28 DIAGNOSIS — Z87442 Personal history of urinary calculi: Secondary | ICD-10-CM | POA: Diagnosis not present

## 2023-10-28 DIAGNOSIS — R338 Other retention of urine: Secondary | ICD-10-CM | POA: Diagnosis not present

## 2023-10-28 DIAGNOSIS — Z87440 Personal history of urinary (tract) infections: Secondary | ICD-10-CM | POA: Diagnosis not present

## 2023-10-28 DIAGNOSIS — M549 Dorsalgia, unspecified: Secondary | ICD-10-CM | POA: Diagnosis not present

## 2023-10-28 DIAGNOSIS — M1990 Unspecified osteoarthritis, unspecified site: Secondary | ICD-10-CM | POA: Diagnosis not present

## 2023-10-28 DIAGNOSIS — G4733 Obstructive sleep apnea (adult) (pediatric): Secondary | ICD-10-CM | POA: Diagnosis not present

## 2023-10-28 DIAGNOSIS — Z466 Encounter for fitting and adjustment of urinary device: Secondary | ICD-10-CM | POA: Diagnosis not present

## 2023-10-28 DIAGNOSIS — G8929 Other chronic pain: Secondary | ICD-10-CM | POA: Diagnosis not present

## 2023-10-28 DIAGNOSIS — M80051D Age-related osteoporosis with current pathological fracture, right femur, subsequent encounter for fracture with routine healing: Secondary | ICD-10-CM | POA: Diagnosis not present

## 2023-10-28 DIAGNOSIS — N138 Other obstructive and reflux uropathy: Secondary | ICD-10-CM | POA: Diagnosis not present

## 2023-10-28 DIAGNOSIS — G4700 Insomnia, unspecified: Secondary | ICD-10-CM | POA: Diagnosis not present

## 2023-10-28 DIAGNOSIS — Z79891 Long term (current) use of opiate analgesic: Secondary | ICD-10-CM | POA: Diagnosis not present

## 2023-10-28 DIAGNOSIS — Z9181 History of falling: Secondary | ICD-10-CM | POA: Diagnosis not present

## 2023-10-28 DIAGNOSIS — Z7401 Bed confinement status: Secondary | ICD-10-CM | POA: Diagnosis not present

## 2023-10-28 DIAGNOSIS — Z5982 Transportation insecurity: Secondary | ICD-10-CM | POA: Diagnosis not present

## 2023-10-28 DIAGNOSIS — K5909 Other constipation: Secondary | ICD-10-CM | POA: Diagnosis not present

## 2023-10-28 DIAGNOSIS — Z7901 Long term (current) use of anticoagulants: Secondary | ICD-10-CM | POA: Diagnosis not present

## 2023-10-28 DIAGNOSIS — N319 Neuromuscular dysfunction of bladder, unspecified: Secondary | ICD-10-CM | POA: Diagnosis not present

## 2023-10-28 DIAGNOSIS — G2581 Restless legs syndrome: Secondary | ICD-10-CM | POA: Diagnosis not present

## 2023-10-28 DIAGNOSIS — I10 Essential (primary) hypertension: Secondary | ICD-10-CM | POA: Diagnosis not present

## 2023-11-02 DIAGNOSIS — G4733 Obstructive sleep apnea (adult) (pediatric): Secondary | ICD-10-CM | POA: Diagnosis not present

## 2023-11-02 DIAGNOSIS — G4700 Insomnia, unspecified: Secondary | ICD-10-CM | POA: Diagnosis not present

## 2023-11-02 DIAGNOSIS — Z79891 Long term (current) use of opiate analgesic: Secondary | ICD-10-CM | POA: Diagnosis not present

## 2023-11-02 DIAGNOSIS — Z466 Encounter for fitting and adjustment of urinary device: Secondary | ICD-10-CM | POA: Diagnosis not present

## 2023-11-02 DIAGNOSIS — Z87442 Personal history of urinary calculi: Secondary | ICD-10-CM | POA: Diagnosis not present

## 2023-11-02 DIAGNOSIS — N319 Neuromuscular dysfunction of bladder, unspecified: Secondary | ICD-10-CM | POA: Diagnosis not present

## 2023-11-02 DIAGNOSIS — M549 Dorsalgia, unspecified: Secondary | ICD-10-CM | POA: Diagnosis not present

## 2023-11-02 DIAGNOSIS — M1990 Unspecified osteoarthritis, unspecified site: Secondary | ICD-10-CM | POA: Diagnosis not present

## 2023-11-02 DIAGNOSIS — I10 Essential (primary) hypertension: Secondary | ICD-10-CM | POA: Diagnosis not present

## 2023-11-02 DIAGNOSIS — Z87440 Personal history of urinary (tract) infections: Secondary | ICD-10-CM | POA: Diagnosis not present

## 2023-11-02 DIAGNOSIS — N138 Other obstructive and reflux uropathy: Secondary | ICD-10-CM | POA: Diagnosis not present

## 2023-11-02 DIAGNOSIS — M80051D Age-related osteoporosis with current pathological fracture, right femur, subsequent encounter for fracture with routine healing: Secondary | ICD-10-CM | POA: Diagnosis not present

## 2023-11-02 DIAGNOSIS — Z7401 Bed confinement status: Secondary | ICD-10-CM | POA: Diagnosis not present

## 2023-11-02 DIAGNOSIS — R338 Other retention of urine: Secondary | ICD-10-CM | POA: Diagnosis not present

## 2023-11-02 DIAGNOSIS — K5909 Other constipation: Secondary | ICD-10-CM | POA: Diagnosis not present

## 2023-11-02 DIAGNOSIS — Z7901 Long term (current) use of anticoagulants: Secondary | ICD-10-CM | POA: Diagnosis not present

## 2023-11-02 DIAGNOSIS — G2581 Restless legs syndrome: Secondary | ICD-10-CM | POA: Diagnosis not present

## 2023-11-02 DIAGNOSIS — G8929 Other chronic pain: Secondary | ICD-10-CM | POA: Diagnosis not present

## 2023-11-02 DIAGNOSIS — Z5982 Transportation insecurity: Secondary | ICD-10-CM | POA: Diagnosis not present

## 2023-11-02 DIAGNOSIS — G629 Polyneuropathy, unspecified: Secondary | ICD-10-CM | POA: Diagnosis not present

## 2023-11-02 DIAGNOSIS — Z9181 History of falling: Secondary | ICD-10-CM | POA: Diagnosis not present

## 2023-11-04 DIAGNOSIS — Z87440 Personal history of urinary (tract) infections: Secondary | ICD-10-CM | POA: Diagnosis not present

## 2023-11-04 DIAGNOSIS — K5909 Other constipation: Secondary | ICD-10-CM | POA: Diagnosis not present

## 2023-11-04 DIAGNOSIS — Z87442 Personal history of urinary calculi: Secondary | ICD-10-CM | POA: Diagnosis not present

## 2023-11-04 DIAGNOSIS — I10 Essential (primary) hypertension: Secondary | ICD-10-CM | POA: Diagnosis not present

## 2023-11-04 DIAGNOSIS — G4700 Insomnia, unspecified: Secondary | ICD-10-CM | POA: Diagnosis not present

## 2023-11-04 DIAGNOSIS — N319 Neuromuscular dysfunction of bladder, unspecified: Secondary | ICD-10-CM | POA: Diagnosis not present

## 2023-11-04 DIAGNOSIS — Z466 Encounter for fitting and adjustment of urinary device: Secondary | ICD-10-CM | POA: Diagnosis not present

## 2023-11-04 DIAGNOSIS — M80051D Age-related osteoporosis with current pathological fracture, right femur, subsequent encounter for fracture with routine healing: Secondary | ICD-10-CM | POA: Diagnosis not present

## 2023-11-04 DIAGNOSIS — Z9181 History of falling: Secondary | ICD-10-CM | POA: Diagnosis not present

## 2023-11-04 DIAGNOSIS — G2581 Restless legs syndrome: Secondary | ICD-10-CM | POA: Diagnosis not present

## 2023-11-04 DIAGNOSIS — M1990 Unspecified osteoarthritis, unspecified site: Secondary | ICD-10-CM | POA: Diagnosis not present

## 2023-11-04 DIAGNOSIS — G4733 Obstructive sleep apnea (adult) (pediatric): Secondary | ICD-10-CM | POA: Diagnosis not present

## 2023-11-04 DIAGNOSIS — Z5982 Transportation insecurity: Secondary | ICD-10-CM | POA: Diagnosis not present

## 2023-11-04 DIAGNOSIS — M549 Dorsalgia, unspecified: Secondary | ICD-10-CM | POA: Diagnosis not present

## 2023-11-04 DIAGNOSIS — G629 Polyneuropathy, unspecified: Secondary | ICD-10-CM | POA: Diagnosis not present

## 2023-11-04 DIAGNOSIS — R338 Other retention of urine: Secondary | ICD-10-CM | POA: Diagnosis not present

## 2023-11-04 DIAGNOSIS — Z7401 Bed confinement status: Secondary | ICD-10-CM | POA: Diagnosis not present

## 2023-11-04 DIAGNOSIS — Z79891 Long term (current) use of opiate analgesic: Secondary | ICD-10-CM | POA: Diagnosis not present

## 2023-11-04 DIAGNOSIS — Z7901 Long term (current) use of anticoagulants: Secondary | ICD-10-CM | POA: Diagnosis not present

## 2023-11-04 DIAGNOSIS — G8929 Other chronic pain: Secondary | ICD-10-CM | POA: Diagnosis not present

## 2023-11-04 DIAGNOSIS — N138 Other obstructive and reflux uropathy: Secondary | ICD-10-CM | POA: Diagnosis not present

## 2023-11-08 DIAGNOSIS — S7291XD Unspecified fracture of right femur, subsequent encounter for closed fracture with routine healing: Secondary | ICD-10-CM | POA: Diagnosis not present

## 2023-11-08 DIAGNOSIS — R5381 Other malaise: Secondary | ICD-10-CM | POA: Diagnosis not present

## 2023-11-08 DIAGNOSIS — M5416 Radiculopathy, lumbar region: Secondary | ICD-10-CM | POA: Diagnosis not present

## 2023-11-09 DIAGNOSIS — G629 Polyneuropathy, unspecified: Secondary | ICD-10-CM | POA: Diagnosis not present

## 2023-11-09 DIAGNOSIS — Z466 Encounter for fitting and adjustment of urinary device: Secondary | ICD-10-CM | POA: Diagnosis not present

## 2023-11-09 DIAGNOSIS — Z87442 Personal history of urinary calculi: Secondary | ICD-10-CM | POA: Diagnosis not present

## 2023-11-09 DIAGNOSIS — Z87440 Personal history of urinary (tract) infections: Secondary | ICD-10-CM | POA: Diagnosis not present

## 2023-11-09 DIAGNOSIS — K5909 Other constipation: Secondary | ICD-10-CM | POA: Diagnosis not present

## 2023-11-09 DIAGNOSIS — N319 Neuromuscular dysfunction of bladder, unspecified: Secondary | ICD-10-CM | POA: Diagnosis not present

## 2023-11-09 DIAGNOSIS — M80051D Age-related osteoporosis with current pathological fracture, right femur, subsequent encounter for fracture with routine healing: Secondary | ICD-10-CM | POA: Diagnosis not present

## 2023-11-09 DIAGNOSIS — M1990 Unspecified osteoarthritis, unspecified site: Secondary | ICD-10-CM | POA: Diagnosis not present

## 2023-11-09 DIAGNOSIS — G4733 Obstructive sleep apnea (adult) (pediatric): Secondary | ICD-10-CM | POA: Diagnosis not present

## 2023-11-09 DIAGNOSIS — Z7401 Bed confinement status: Secondary | ICD-10-CM | POA: Diagnosis not present

## 2023-11-09 DIAGNOSIS — Z79891 Long term (current) use of opiate analgesic: Secondary | ICD-10-CM | POA: Diagnosis not present

## 2023-11-09 DIAGNOSIS — G4700 Insomnia, unspecified: Secondary | ICD-10-CM | POA: Diagnosis not present

## 2023-11-09 DIAGNOSIS — Z5982 Transportation insecurity: Secondary | ICD-10-CM | POA: Diagnosis not present

## 2023-11-09 DIAGNOSIS — N138 Other obstructive and reflux uropathy: Secondary | ICD-10-CM | POA: Diagnosis not present

## 2023-11-09 DIAGNOSIS — G2581 Restless legs syndrome: Secondary | ICD-10-CM | POA: Diagnosis not present

## 2023-11-09 DIAGNOSIS — I10 Essential (primary) hypertension: Secondary | ICD-10-CM | POA: Diagnosis not present

## 2023-11-09 DIAGNOSIS — M549 Dorsalgia, unspecified: Secondary | ICD-10-CM | POA: Diagnosis not present

## 2023-11-09 DIAGNOSIS — R338 Other retention of urine: Secondary | ICD-10-CM | POA: Diagnosis not present

## 2023-11-09 DIAGNOSIS — Z7901 Long term (current) use of anticoagulants: Secondary | ICD-10-CM | POA: Diagnosis not present

## 2023-11-09 DIAGNOSIS — G8929 Other chronic pain: Secondary | ICD-10-CM | POA: Diagnosis not present

## 2023-11-09 DIAGNOSIS — Z9181 History of falling: Secondary | ICD-10-CM | POA: Diagnosis not present

## 2023-12-22 ENCOUNTER — Other Ambulatory Visit (HOSPITAL_COMMUNITY): Payer: Self-pay | Admitting: Adult Health

## 2023-12-22 DIAGNOSIS — N390 Urinary tract infection, site not specified: Secondary | ICD-10-CM

## 2023-12-24 ENCOUNTER — Other Ambulatory Visit: Payer: Self-pay | Admitting: Urology

## 2023-12-27 ENCOUNTER — Other Ambulatory Visit: Payer: Self-pay | Admitting: Urology

## 2023-12-27 ENCOUNTER — Other Ambulatory Visit (HOSPITAL_COMMUNITY): Payer: Self-pay | Admitting: Adult Health

## 2023-12-27 DIAGNOSIS — R339 Retention of urine, unspecified: Secondary | ICD-10-CM

## 2023-12-28 ENCOUNTER — Encounter: Payer: Self-pay | Admitting: Physician Assistant

## 2024-01-03 ENCOUNTER — Other Ambulatory Visit (HOSPITAL_COMMUNITY): Payer: Self-pay | Admitting: Radiology

## 2024-01-03 ENCOUNTER — Ambulatory Visit (HOSPITAL_COMMUNITY)
Admission: RE | Admit: 2024-01-03 | Discharge: 2024-01-03 | Disposition: A | Discharge status: Home / Self Care | Source: Ambulatory Visit | Attending: Adult Health | Admitting: Adult Health

## 2024-01-03 ENCOUNTER — Other Ambulatory Visit: Payer: Self-pay | Admitting: Student

## 2024-01-03 DIAGNOSIS — J90 Pleural effusion, not elsewhere classified: Secondary | ICD-10-CM | POA: Diagnosis not present

## 2024-01-03 DIAGNOSIS — T83021A Displacement of indwelling urethral catheter, initial encounter: Secondary | ICD-10-CM | POA: Diagnosis not present

## 2024-01-03 DIAGNOSIS — N390 Urinary tract infection, site not specified: Secondary | ICD-10-CM | POA: Insufficient documentation

## 2024-01-03 DIAGNOSIS — N132 Hydronephrosis with renal and ureteral calculous obstruction: Secondary | ICD-10-CM | POA: Diagnosis not present

## 2024-01-03 DIAGNOSIS — Z01818 Encounter for other preprocedural examination: Secondary | ICD-10-CM

## 2024-01-03 NOTE — H&P (Incomplete)
 Chief Complaint: Patient was seen in consultation today for urinary retention.   Referring Physician(s): Davis,Jennaya L  Supervising Physician: Jennefer Rover  Patient Status: [facility] - Out-pt  History of Present Illness: Luke Foster is a 73 y.o. male with a medical history significant for HTN, arthritis, dementia, kidney stones and urinary retention with UTIs. He often gets 2-3 UTIs each year and some are significant enough to require hospitalization. He currently has an indwelling foley catheter. His healthcare provider has recommended transitioning to a suprapubic catheter for long term management of urinary retention.    Patient alert and laying in bed, calm. His brother (POA) is at his bedside. Currently without any significant complaints, aside from discomfort from baseline bilateral feet neuropathy. Patient denies any fevers, headache, chest pain, SOB, cough, abdominal pain, nausea, vomiting or bleeding.     Past Medical History:  Diagnosis Date   Arthritis    Chronic back pain    Chronic pain 1999   Bilateral feet (R >L)   Dementia (HCC) 04/19/2023   Difficult or painful urination    High blood pressure    History of kidney stones    H/O   Nerve damage    Pain management    Paresthesia of foot, bilateral    Renal disorder    Sleep apnea    USES CPAP   Weakness of both legs     Past Surgical History:  Procedure Laterality Date   arm surgery     BACK SURGERY     X2   BOTOX  INJECTION N/A 10/06/2021   Procedure: CYSTOSCOPY LITHOLAPAXY WITH BOTOX  INJECTION;  Surgeon: Carolee Sherwood JONETTA DOUGLAS, MD;  [facility];  Service: Urology;  Laterality: N/A;   BOTOX  INJECTION N/A 04/20/2022   Procedure: CYSTOSCOPY & BOTOX  INJECTION, BLADDER FULGARATION;  Surgeon: Carolee Sherwood JONETTA DOUGLAS, MD;  [facility];  Service: Urology;  Laterality: N/A;  45 MINS   BOTOX  INJECTION N/A 12/28/2022   Procedure: CYSTOSCOPY WITH INTRA DETRUSOR BOTOX  INJECTION;  Surgeon: Carolee Sherwood JONETTA DOUGLAS,  MD;  [facility];  Service: Urology;  Laterality: N/A;  45 MINS FOR CASE   BOWEL RESECTION N/A 01/15/2022   Procedure: SMALL BOWEL RESECTION;  Surgeon: Evonnie Dorothyann LABOR, DO;  Location: [facility];  Service: General;  Laterality: N/A;   CYSTOSCOPY W/ URETERAL STENT PLACEMENT Right 11/29/2021   Procedure: CYSTOSCOPY WITH RETROGRADE PYELOGRAM/URETERAL STENT PLACEMENT;  Surgeon: Watt Rush, MD;  [facility];  Service: Urology;  Laterality: Right;   CYSTOSCOPY WITH STENT PLACEMENT Right 04/20/2023   Procedure: CYSTOSCOPY, RETROGRADE PYELOGRAM, RIGHT STENT INSERTION;  Surgeon: Lovie Arlyss CROME, MD;  [facility];  Service: Urology;  Laterality: Right;   CYSTOSCOPY/URETEROSCOPY/HOLMIUM LASER/STENT PLACEMENT Right 01/05/2022   Procedure: CYSTOSCOPY RIGHT URETEROSCOPY/HOLMIUM LASER/STENT PLACEMENT basket extraction stones;  Surgeon: Carolee Sherwood JONETTA DOUGLAS, MD;  [facility]: [facility];  Service: Urology;  Laterality: Right;  1 HR FOR CASE   CYSTOSCOPY/URETEROSCOPY/HOLMIUM LASER/STENT PLACEMENT Right 04/30/2023   Procedure: CYSTOSCOPY/URETEROSCOPY/HOLMIUM LASER/STENT PLACEMENT;  Surgeon: Carolee Sherwood JONETTA DOUGLAS, MD;  [facility];  Service: Urology;  Laterality: Right;   FACIAL COSMETIC SURGERY     HERNIA REPAIR     IR US  GUIDE BX ASP/DRAIN  10/19/2017   KIDNEY STONE SURGERY     LAPAROTOMY N/A 01/15/2022   Procedure: Exploratory laparotomy;  Surgeon: Evonnie Dorothyann LABOR, DO;  Location: [facility];  Service: General;  Laterality: N/A;   LUMBAR SPINAL CORD SIMULATOR LEAD REMOVAL N/A 03/01/2019   Procedure:  LUMBAR SPINAL CORD SIMULATOR LEAD REMOVAL;  Surgeon: Clois Fret, MD;  Location: [facility];  Service: Neurosurgery;  Laterality: N/A;   MASTECTOMY Left    ORIF FEMUR FRACTURE Right 05/12/2023   Procedure: OPEN REDUCTION INTERNAL FIXATION (ORIF) DISTAL FEMUR FRACTURE;  Surgeon: Kendal Franky SQUIBB, MD;  Location: [facility];  Service: Orthopedics;  Laterality: Right;   SPINAL CORD STIMULATOR REMOVAL N/A  03/01/2019   Procedure: LUMBAR SPINAL CORD STIMULATOR REMOVAL;  Surgeon: Clois Fret, MD;  Location: [facility];  Service: Neurosurgery;  Laterality: N/A;   WISDOM TOOTH EXTRACTION      Allergies: Erythromycin  Medications: Prior to Admission medications   Medication Sig Start Date End Date Taking? Authorizing Provider  acetaminophen  (TYLENOL ) 500 MG tablet Take 1,000 mg by mouth every 6 (six) hours as needed for mild pain or moderate pain.    [provider]  apixaban  (ELIQUIS ) 2.5 MG TABS tablet Take 1 tablet (2.5 mg total) by mouth 2 (two) times daily. 05/14/23   Danton Lauraine LABOR, PA-C  ascorbic acid (VITAMIN C) 500 MG tablet Take 500 mg by mouth daily.    [provider]  Cholecalciferol  (VITAMIN D ) 50 MCG (2000 UT) CAPS Take 2,000 Units by mouth daily.    [provider]  cyanocobalamin  (VITAMIN B12) 1000 MCG tablet Take 1,000 mcg by mouth in the morning and at bedtime.    [provider]  DILT-XR 120 MG 24 hr capsule Take 120 mg by mouth daily. 12/07/22   [provider]  docusate sodium  (COLACE) 100 MG capsule Take 1 capsule (100 mg total) by mouth in the morning, at noon, and at bedtime. Patient taking differently: Take 300 mg by mouth at bedtime. 05/17/20   Medina-Vargas, Monina C, NP  ferrous sulfate  325 (65 FE) MG tablet Take 1 tablet (325 mg total) by mouth daily at 8 pm. 05/01/23   Regalado, Belkys A, MD  folic acid  (FOLVITE ) 1 MG tablet Take 1 tablet (1 mg total) by mouth daily. 05/02/23   Regalado, Belkys A, MD  lidocaine  (LIDODERM ) 5 % Place 1 patch onto the skin daily as needed (pain). 11/19/22   [provider]  methocarbamol  (ROBAXIN ) 500 MG tablet Take 1 tablet (500 mg total) by mouth every 6 (six) hours as needed for muscle spasms. 05/14/23   Danton Lauraine LABOR, PA-C  oxyCODONE  (ROXICODONE ) 15 MG immediate release tablet Take 1 tablet (15 mg total) by mouth every 4 (four) hours as needed for pain. 05/28/23   Amin, Saad, MD   polyethylene glycol (MIRALAX  / GLYCOLAX ) 17 g packet Take 17 g by mouth 2 (two) times daily. Patient taking differently: Take 17 g by mouth daily as needed for mild constipation. 05/17/20   Medina-Vargas, Monina C, NP  solifenacin (VESICARE) 5 MG tablet Take 5 mg by mouth daily. 07/17/21   [provider]  tamsulosin  (FLOMAX ) 0.4 MG CAPS capsule Take 1 capsule (0.4 mg total) by mouth every evening. Patient taking differently: Take 0.4 mg by mouth at bedtime. 05/17/20   Medina-Vargas, Monina C, NP  venlafaxine  XR (EFFEXOR -XR) 75 MG 24 hr capsule Take 1 capsule (75 mg total) by mouth in the morning and at bedtime. 05/17/20   Medina-Vargas, Monina C, NP  Vibegron  (GEMTESA  PO) Take 50 mg by mouth at bedtime.    [provider]     Family History  Problem Relation Age of Onset   Heart disease Father        Living, 57   Breast cancer  Mother        Living, 83   Hypercholesterolemia Mother    Healthy Brother    Healthy Sister     Social History   Socioeconomic History   Marital status: Single    Spouse name: Not on file   Number of children: 0   Years of education: Not on file   Highest education level: High school graduate  Occupational History   Not on file  Tobacco Use   Smoking status: Former    Current packs/day: 0.00    Average packs/day: 2.0 packs/day for 10.0 years (20.0 ttl pk-yrs)    Types: Cigarettes    Start date: 05/20/1981    Quit date: 05/21/1991    Years since quitting: 32.6   Smokeless tobacco: Former    Quit date: 06/08/1991  Vaping Use   Vaping status: Never Used  Substance and Sexual Activity   Alcohol use: Yes    Comment: rare   Drug use: No   Sexual activity: Not Currently  Other Topics Concern   Not on file  Social History Narrative   Lives with wife.   He is on disability since 2011 due to chronic back pain.      Caffeine - one mountain dew in the morning    Social Drivers of Health   Financial Resource Strain: Not on file  Food  Insecurity: No Food Insecurity (05/11/2023)   Hunger Vital Sign    Worried About Running Out of Food in the Last Year: Never true    Ran Out of Food in the Last Year: Never true  Transportation Needs: No Transportation Needs (05/11/2023)   PRAPARE - Administrator, Civil Service (Medical): No    Lack of Transportation (Non-Medical): No  Physical Activity: Not on file  Stress: Not on file  Social Connections: Socially Isolated (05/11/2023)   Social Connection and Isolation Panel    Frequency of Communication with Friends and Family: More than three times a week    Frequency of Social Gatherings with Friends and Family: More than three times a week    Attends Religious Services: Never    Database Administrator or Organizations: No    Attends Banker Meetings: Never    Marital Status: Never married    Review of Systems: A 12 point ROS discussed and pertinent positives are indicated in the HPI above.  All other systems are negative.   Vital Signs: BP (!) 148/90   Pulse 82   Temp 98.4 F (36.9 C) (Oral)   Resp 17   Ht 5' 10 (1.778 m)   Wt 200 lb (90.7 kg)   SpO2 99%   BMI 28.70 kg/m   Physical Exam Vitals reviewed.  Constitutional:      General: He is not in acute distress.    Appearance: Normal appearance.     Comments: Patient with baseline dementia. Responds appropriately to commands.  HENT:     Mouth/Throat:     Mouth: Mucous membranes are dry.  Cardiovascular:     Rate and Rhythm: Normal rate and regular rhythm.     Pulses: Normal pulses.     Heart sounds: Normal heart sounds.  Pulmonary:     Effort: Pulmonary effort is normal.     Breath sounds: Normal breath sounds.  Abdominal:     General: Abdomen is flat. There is no distension.     Palpations: Abdomen is soft.     Tenderness: There is no abdominal  tenderness.  Musculoskeletal:        General: Normal range of motion.     Cervical back: Normal range of motion.  Skin:    General: Skin is  warm and dry.  Neurological:     Mental Status: He is alert. Mental status is at baseline.     Comments: Patient with baseline dementia. Responds appropriately to commands.  Psychiatric:        Mood and Affect: Mood normal.        Behavior: Behavior normal.     Comments: Patient with baseline dementia. Responds appropriately to commands.     Imaging: No results found.  Labs:  CBC: Recent Labs    05/01/23 0512 05/11/23 1406 05/12/23 0606 05/13/23 0914  WBC 14.1* 16.7* 18.7* 14.2*  HGB 7.8* 7.6* 6.8* 8.7*  HCT 24.9* 24.5* 21.3* 26.0*  PLT 404* 382 427* 376    COAGS: No results for input(s): INR, APTT in the last 8760 hours.  BMP: Recent Labs    05/01/23 0512 05/11/23 1406 05/12/23 0606 05/13/23 0914  NA 135 134* 137 135  K 4.1 3.6 4.0 4.2  CL 104 96* 102 101  CO2 25 21* 22 20*  GLUCOSE 121* 98 100* 127*  BUN 17 26* 20 25*  CALCIUM 8.0* 8.6* 8.8* 8.7*  CREATININE 0.98 1.22 0.94 1.00  GFRNONAA >60 >60 >60 >60    LIVER FUNCTION TESTS: Recent Labs    04/28/23 0503 04/29/23 0824 04/30/23 0426 05/11/23 1406  BILITOT 1.1 1.1 1.2 0.6  AST 110* 57* 39 40  ALT 177* 141* 102* 43  ALKPHOS 85 81 76 111  PROT 5.8* 5.9* 5.5* 7.2  ALBUMIN 1.9* 2.1* 2.0* 2.0*    TUMOR MARKERS: No results for input(s): AFPTM, CEA, CA199, CHROMGRNA in the last 8760 hours.  Assessment and Plan:  Urinary retention: Luke Foster, 73 year old male, presents today for an image-guided suprapubic catheter placement.   Risks and benefits discussed with the patient including bleeding, infection, damage to adjacent structures, bowel perforation/fistula connection, and sepsis.  All of the patient's questions were answered, patient is agreeable to proceed. He has been NPO. He does not take any blood-thinning medications.   Consent signed and in chart.  Thank you for this interesting consult.  I greatly enjoyed meeting REBECCA MOTTA and look forward to participating in their  care.  A copy of this report was sent to the requesting provider on this date.   Electronically Signed: Carlin DELENA Griffon, PA-C 01/04/2024, 12:48 PM   I spent a total of  30 Minutes   in face to face in clinical consultation, greater than 50% of which was counseling/coordinating care for urinary retention.

## 2024-01-04 ENCOUNTER — Other Ambulatory Visit: Payer: Self-pay

## 2024-01-04 ENCOUNTER — Ambulatory Visit (HOSPITAL_COMMUNITY)
Admission: RE | Admit: 2024-01-04 | Discharge: 2024-01-04 | Disposition: A | Discharge status: Home / Self Care | Source: Ambulatory Visit | Attending: Adult Health | Admitting: Adult Health

## 2024-01-04 DIAGNOSIS — F0390 Unspecified dementia without behavioral disturbance: Secondary | ICD-10-CM | POA: Insufficient documentation

## 2024-01-04 DIAGNOSIS — I10 Essential (primary) hypertension: Secondary | ICD-10-CM | POA: Diagnosis not present

## 2024-01-04 DIAGNOSIS — G629 Polyneuropathy, unspecified: Secondary | ICD-10-CM | POA: Insufficient documentation

## 2024-01-04 DIAGNOSIS — R339 Retention of urine, unspecified: Secondary | ICD-10-CM | POA: Insufficient documentation

## 2024-01-04 DIAGNOSIS — Z87891 Personal history of nicotine dependence: Secondary | ICD-10-CM | POA: Diagnosis not present

## 2024-01-04 DIAGNOSIS — Z79899 Other long term (current) drug therapy: Secondary | ICD-10-CM | POA: Diagnosis not present

## 2024-01-04 DIAGNOSIS — Z01818 Encounter for other preprocedural examination: Secondary | ICD-10-CM

## 2024-01-04 HISTORY — PX: IR CYSTOSTOMY TUBE PLACEMENT/BLADDER ASPIRATION: IMG1097

## 2024-01-04 LAB — PROTIME-INR
INR: 1 (ref 0.8–1.2)
Prothrombin Time: 13.4 s (ref 11.4–15.2)

## 2024-01-04 LAB — CBC
HCT: 41.1 % (ref 39.0–52.0)
Hemoglobin: 13 g/dL (ref 13.0–17.0)
MCH: 28.7 pg (ref 26.0–34.0)
MCHC: 31.6 g/dL (ref 30.0–36.0)
MCV: 90.7 fL (ref 80.0–100.0)
Platelets: 304 K/uL (ref 150–400)
RBC: 4.53 MIL/uL (ref 4.22–5.81)
RDW: 15.2 % (ref 11.5–15.5)
WBC: 12.3 K/uL — ABNORMAL HIGH (ref 4.0–10.5)
nRBC: 0 % (ref 0.0–0.2)

## 2024-01-04 MED ORDER — MIDAZOLAM HCL (PF) 2 MG/2ML IJ SOLN
INTRAMUSCULAR | Status: AC | PRN
Start: 2024-01-04 — End: 2024-01-04
  Administered 2024-01-04: 1 mg via INTRAVENOUS

## 2024-01-04 MED ORDER — SODIUM CHLORIDE 0.9 % IV SOLN: INTRAVENOUS | Status: DC

## 2024-01-04 MED ORDER — MIDAZOLAM HCL 2 MG/2ML IJ SOLN
INTRAMUSCULAR | Status: AC
Start: 2024-01-04 — End: 2024-01-04
  Filled 2024-01-04: qty 2

## 2024-01-04 MED ORDER — IOHEXOL 300 MG/ML  SOLN
50.0000 mL | Freq: Once | INTRAMUSCULAR | Status: AC | PRN
  Administered 2024-01-04: 15 mL

## 2024-01-04 MED ORDER — SODIUM CHLORIDE 0.9% FLUSH: 5.0000 mL | Freq: Three times a day (TID) | INTRAVENOUS | Status: DC

## 2024-01-04 MED ORDER — FENTANYL CITRATE (PF) 100 MCG/2ML IJ SOLN
INTRAMUSCULAR | Status: AC | PRN
  Administered 2024-01-04: 50 ug via INTRAVENOUS

## 2024-01-04 MED ORDER — FENTANYL CITRATE (PF) 100 MCG/2ML IJ SOLN
INTRAMUSCULAR | Status: AC
  Filled 2024-01-04: qty 2

## 2024-01-04 MED ORDER — LIDOCAINE-EPINEPHRINE 1 %-1:100000 IJ SOLN
INTRAMUSCULAR | Status: AC
  Filled 2024-01-04: qty 1

## 2024-01-04 NOTE — Procedures (Signed)
 Interventional Radiology Procedure Note  Procedure:  Ultrasound and fluoroscopic guided suprapubic catheter placement   Findings: Please refer to procedural dictation for full description. 16 Fr Council tip catheter placed, to bag drainage.  Complications: None immediate  Estimated Blood Loss: < 5 mL  Recommendations: Follow up with IR as needed.   Ester Sides, MD

## 2024-01-04 NOTE — Progress Notes (Signed)
 Pt and brother received discharge instructions, teach back performed, IV removed, no complications. Suprapubic site is clean dry intact, no signs of bleeding, catheter bad emptied, of urine out. Assisted brother in wheeling patient out via wheelchair to be picked up by transport.

## 2024-01-17 ENCOUNTER — Ambulatory Visit (HOSPITAL_COMMUNITY): Admit: 2024-01-17 | Admitting: Urology

## 2024-01-17 SURGERY — CYSTOSCOPY, WITH INJECTION OF BLADDER NECK OR BLADDER WALL
Anesthesia: General

## 2024-02-14 ENCOUNTER — Ambulatory Visit (HOSPITAL_COMMUNITY): Admit: 2024-02-14 | Admitting: Urology

## 2024-02-14 SURGERY — CYSTOSCOPY, WITH INJECTION OF BLADDER NECK OR BLADDER WALL
Anesthesia: General

## 2024-03-09 NOTE — Progress Notes (Incomplete)
 "  Dementia with behavioral disturbance***  Luke Foster is a very pleasant 74 y.o. year old RH male with a history of hypertension, hyperlipidemia, depression, anxiety, lumbar radiculopathy, seen today for evaluation of memory loss.  He carries has a history of dementia known type but has not seen a neurologist in a long time, records are not available.  Progression of the disease is noted***.  MoCA today is ***. Etiology is ***. Patient is able to participate on ADLs and to to drive without difficulties. Mood is *** . Patient is accompanied by *** who supplement the history.  Follow up in *** months  *** pending on the above results  MRI brain to further evaluate for structural abnormalities and vascular load  Neuropsychological evaluation for further investigate other causes of memory loss including sleep, attention, anxiety, depression among others Continue PT OT as per PCP Continue to control mood as per PCP, he is on quetiapine 25 mg nightly Effexor  225 mg daily. Check B12 TSH   Discussed the use of AI scribe software for clinical note transcription with the patient, who gave verbal consent to proceed.  History of Present Illness      How long did patient have memory difficulties?  For about.  Patient reports some difficulty remembering new information, recent conversations, names. repeats oneself?  Endorsed Disoriented when walking into a room? Denies ***  Leaving objects in unusual places?  Denies.   Wandering behavior? Denies.   Any personality changes, or depression, anxiety? Denies *** Hallucinations or paranoia? Denies.   Seizures? Denies.    Any sleep changes?  Sleeps well *** Does not sleep well. **  frequent nightmares or dream reenactment, other REM behavior or sleepwalking   Sleep apnea? Denies.   Any hygiene concerns?  Denies.   Independent of bathing and dressing? Endorsed  Does the patient need help with medications?  is in charge *** Who is in charge of the  finances?  is in charge   *** Any changes in appetite?   Denies. ***   Patient have trouble swallowing?  Denies.   Does the patient cook? No*** yes, denies forgetting common recipes or kitchen accidents *** Any headaches?  Denies.   Chronic pain?  Yes, chronic LS radiculopathy, chronic low back pain, previously on LKS cord stimulator removed January 2021 for femoral fracture he was in rehab, then to group home for narcotic monitoring.  Prior, he was followed at the [facility], now managed by PCP.  He is on oxycodone  5 mg 3 times daily, gabapentin  teen and extra-strength Tylenol  along with PT. Ambulates with difficulty? Denies. ***  Needs a cane*** Needs a walker *** to ambulate for stability.   Recent falls or head injuries?  In March 2025, the patient had been hospitalized for right femur fracture after a mechanical fall.***In 2022, he had a mechanical fall sustaining left head injury without LOC Vision changes?  Denies any new issues.  Has a history of*** Any strokelike symptoms? Denies.   Any tremors? Denies. *** Any anosmia? Denies.   Any incontinence of urine?  Yes, he is on Gemtesa  and Flomax  Any bowel dysfunction? Denies.      Patient lives with ***  History of heavy alcohol intake? Denies.   History of heavy tobacco use? Denies.   Family history of dementia?   *** with dementia  Does patient drive? No longer drives  *** yes, denies getting lost.***  Recent labs September 2025 normal lipid panel, TSH 1.2 CMET  normal unremarkable CBC   Allergies[1]  Current Outpatient Medications  Medication Instructions   acetaminophen  (TYLENOL ) 1,000 mg, Every 6 hours PRN   apixaban  (ELIQUIS ) 2.5 mg, Oral, 2 times daily   ascorbic acid (VITAMIN C) 500 mg, Daily   cyanocobalamin  (VITAMIN B12) 1,000 mcg, 2 times daily   Dilt-XR 120 mg, Daily   docusate sodium  (COLACE) 100 mg, Oral, 3 times daily   ferrous sulfate  325 mg, Oral, Daily   folic acid  (FOLVITE ) 1 mg, Oral, Daily    lidocaine  (LIDODERM ) 5 % 1 patch, Daily PRN   methocarbamol  (ROBAXIN ) 500 mg, Oral, Every 6 hours PRN   oxyCODONE  (ROXICODONE ) 15 mg, Oral, Every 4 hours PRN   polyethylene glycol (MIRALAX  / GLYCOLAX ) 17 g, Oral, 2 times daily   solifenacin (VESICARE) 5 mg, Daily   tamsulosin  (FLOMAX ) 0.4 mg, Oral, Every evening   venlafaxine  XR (EFFEXOR -XR) 75 mg, Oral, 2 times daily   Vibegron  (GEMTESA  PO) 50 mg, Daily at bedtime   Vitamin D  2,000 Units, Daily    VITALS:  There were no vitals filed for this visit.   Neurological Exam      No data to display              No data to display            Orientation:  Alert and oriented to person, place and not to time***. No aphasia or dysarthria. Fund of knowledge is appropriate. Recent and remote memory impaired.  Attention and concentration are reduced***.  Able to name objects and repeat phrases. *** Delayed recall  /5 .*** Cranial nerves: There is good facial symmetry. Extraocular muscles are intact and visual fields are full to confrontational testing. Speech is fluent and clear. No tongue deviation. Hearing is intact to conversational tone.*** Tone: Tone is good throughout. Sensation: Sensation is intact to light touch.  Vibration is intact at the bilateral big toe.  Coordination: The patient has no difficulty with RAM's or FNF bilaterally. Normal finger to nose  Motor: Strength is 5/5 in the bilateral upper and lower extremities. There is no pronator drift. There are no fasciculations noted. DTR's: Deep tendon reflexes are 2/4 bilaterally. Gait and Station: The patient is able to ambulate without difficulty. Gait is cautious and narrow. Stride length is normal. ***      Thank you for allowing us  the opportunity to participate in the care of this nice patient. Please do not hesitate to contact us  for any questions or concerns.   Total time spent on today's visit was *** minutes dedicated to this patient today, preparing to see patient,  examining the patient, ordering tests and/or medications and counseling the patient, documenting clinical information in the EHR or other health record, independently interpreting results and communicating results to the patient/family, discussing treatment and goals, answering patient's questions and coordinating care.  Cc:  Seabron Lenis, MD  Camie Sevin 03/09/2024 6:48 AM      [1]  Allergies Allergen Reactions   Erythromycin Itching   "

## 2024-03-10 ENCOUNTER — Ambulatory Visit: Payer: Self-pay | Admitting: Physician Assistant

## 2024-03-10 ENCOUNTER — Ambulatory Visit: Admitting: Physician Assistant

## 2024-03-10 ENCOUNTER — Ambulatory Visit

## 2024-03-10 ENCOUNTER — Ambulatory Visit: Payer: Self-pay

## 2024-03-14 ENCOUNTER — Encounter: Payer: Self-pay | Admitting: Physician Assistant

## 2024-04-20 ENCOUNTER — Ambulatory Visit

## 2024-04-20 ENCOUNTER — Ambulatory Visit: Admitting: Physician Assistant
